# Patient Record
Sex: Male | Born: 1935 | Race: Black or African American | Hispanic: No | Smoking: Never smoker
Health system: Southern US, Community
[De-identification: ages and names within clinical notes are randomized; demographics above are authoritative.]

## PROBLEM LIST (undated history)

## (undated) ENCOUNTER — Emergency Department (HOSPITAL_COMMUNITY): Admission: EM | Payer: Medicare Other | Source: Home / Self Care

## (undated) DIAGNOSIS — N289 Disorder of kidney and ureter, unspecified: Secondary | ICD-10-CM

## (undated) DIAGNOSIS — N400 Enlarged prostate without lower urinary tract symptoms: Secondary | ICD-10-CM

## (undated) DIAGNOSIS — I10 Essential (primary) hypertension: Secondary | ICD-10-CM

## (undated) DIAGNOSIS — F329 Major depressive disorder, single episode, unspecified: Secondary | ICD-10-CM

## (undated) DIAGNOSIS — E039 Hypothyroidism, unspecified: Secondary | ICD-10-CM

## (undated) DIAGNOSIS — F32A Depression, unspecified: Secondary | ICD-10-CM

## (undated) HISTORY — PX: HERNIA REPAIR: SHX51

---

## 2001-08-11 ENCOUNTER — Emergency Department (HOSPITAL_COMMUNITY): Admission: EM | Admit: 2001-08-11 | Discharge: 2001-08-11 | Payer: Self-pay | Admitting: Emergency Medicine

## 2001-08-11 ENCOUNTER — Encounter: Payer: Self-pay | Admitting: Emergency Medicine

## 2001-08-19 ENCOUNTER — Emergency Department (HOSPITAL_COMMUNITY): Admission: EM | Admit: 2001-08-19 | Discharge: 2001-08-19 | Payer: Self-pay | Admitting: *Deleted

## 2001-08-26 ENCOUNTER — Ambulatory Visit (HOSPITAL_COMMUNITY): Admission: RE | Admit: 2001-08-26 | Discharge: 2001-08-26 | Payer: Self-pay | Admitting: Family Medicine

## 2001-08-26 ENCOUNTER — Encounter: Payer: Self-pay | Admitting: Family Medicine

## 2001-08-28 ENCOUNTER — Emergency Department (HOSPITAL_COMMUNITY): Admission: EM | Admit: 2001-08-28 | Discharge: 2001-08-28 | Payer: Self-pay | Admitting: *Deleted

## 2001-08-28 ENCOUNTER — Encounter: Payer: Self-pay | Admitting: *Deleted

## 2001-09-20 ENCOUNTER — Encounter: Payer: Self-pay | Admitting: Family Medicine

## 2001-09-20 ENCOUNTER — Ambulatory Visit (HOSPITAL_COMMUNITY): Admission: RE | Admit: 2001-09-20 | Discharge: 2001-09-20 | Payer: Self-pay | Admitting: Family Medicine

## 2003-01-04 ENCOUNTER — Encounter: Admission: RE | Admit: 2003-01-04 | Discharge: 2003-04-04 | Payer: Self-pay | Admitting: Family Medicine

## 2005-11-11 ENCOUNTER — Ambulatory Visit: Payer: Self-pay | Admitting: Internal Medicine

## 2005-11-11 ENCOUNTER — Ambulatory Visit (HOSPITAL_COMMUNITY): Admission: RE | Admit: 2005-11-11 | Discharge: 2005-11-11 | Payer: Self-pay | Admitting: Internal Medicine

## 2007-10-17 ENCOUNTER — Emergency Department (HOSPITAL_COMMUNITY): Admission: EM | Admit: 2007-10-17 | Discharge: 2007-10-17 | Payer: Self-pay | Admitting: Emergency Medicine

## 2008-07-20 ENCOUNTER — Ambulatory Visit (HOSPITAL_COMMUNITY): Admission: RE | Admit: 2008-07-20 | Discharge: 2008-07-20 | Payer: Self-pay | Admitting: Family Medicine

## 2009-04-03 ENCOUNTER — Emergency Department (HOSPITAL_COMMUNITY): Admission: EM | Admit: 2009-04-03 | Discharge: 2009-04-03 | Payer: Self-pay | Admitting: Emergency Medicine

## 2009-04-15 ENCOUNTER — Ambulatory Visit (HOSPITAL_COMMUNITY): Admission: RE | Admit: 2009-04-15 | Discharge: 2009-04-15 | Payer: Self-pay | Admitting: Urology

## 2009-05-13 ENCOUNTER — Emergency Department (HOSPITAL_COMMUNITY): Admission: EM | Admit: 2009-05-13 | Discharge: 2009-05-14 | Payer: Self-pay | Admitting: Emergency Medicine

## 2009-06-28 ENCOUNTER — Encounter (INDEPENDENT_AMBULATORY_CARE_PROVIDER_SITE_OTHER): Payer: Self-pay | Admitting: Urology

## 2009-06-28 ENCOUNTER — Inpatient Hospital Stay (HOSPITAL_COMMUNITY): Admission: RE | Admit: 2009-06-28 | Discharge: 2009-07-02 | Payer: Self-pay | Admitting: Urology

## 2009-07-23 ENCOUNTER — Ambulatory Visit (HOSPITAL_COMMUNITY): Admission: RE | Admit: 2009-07-23 | Discharge: 2009-07-23 | Payer: Self-pay | Admitting: Urology

## 2009-09-24 ENCOUNTER — Ambulatory Visit (HOSPITAL_COMMUNITY): Admission: RE | Admit: 2009-09-24 | Discharge: 2009-09-24 | Payer: Self-pay | Admitting: Urology

## 2010-02-07 ENCOUNTER — Ambulatory Visit (HOSPITAL_COMMUNITY): Admission: RE | Admit: 2010-02-07 | Discharge: 2010-02-07 | Payer: Self-pay | Admitting: Urology

## 2011-02-17 ENCOUNTER — Emergency Department (HOSPITAL_COMMUNITY)
Admission: EM | Admit: 2011-02-17 | Discharge: 2011-02-17 | Disposition: A | Discharge status: Home / Self Care | Payer: Medicare PPO | Attending: Emergency Medicine | Admitting: Emergency Medicine

## 2011-02-17 DIAGNOSIS — E039 Hypothyroidism, unspecified: Secondary | ICD-10-CM | POA: Insufficient documentation

## 2011-02-17 DIAGNOSIS — E119 Type 2 diabetes mellitus without complications: Secondary | ICD-10-CM | POA: Insufficient documentation

## 2011-02-17 DIAGNOSIS — M25579 Pain in unspecified ankle and joints of unspecified foot: Secondary | ICD-10-CM | POA: Insufficient documentation

## 2011-02-17 DIAGNOSIS — I10 Essential (primary) hypertension: Secondary | ICD-10-CM | POA: Insufficient documentation

## 2011-02-17 DIAGNOSIS — M109 Gout, unspecified: Secondary | ICD-10-CM | POA: Insufficient documentation

## 2011-03-12 LAB — BASIC METABOLIC PANEL
BUN: 17 mg/dL (ref 6–23)
CO2: 27 mEq/L (ref 19–32)
Chloride: 103 mEq/L (ref 96–112)
Creatinine, Ser: 1.58 mg/dL — ABNORMAL HIGH (ref 0.4–1.5)
GFR calc non Af Amer: 43 mL/min — ABNORMAL LOW (ref >60)
Glucose, Bld: 159 mg/dL — ABNORMAL HIGH (ref 70–99)

## 2011-03-15 LAB — BASIC METABOLIC PANEL
BUN: 11 mg/dL (ref 6–23)
BUN: 12 mg/dL (ref 6–23)
BUN: 13 mg/dL (ref 6–23)
CO2: 25 mEq/L (ref 19–32)
CO2: 29 mEq/L (ref 19–32)
CO2: 29 mEq/L (ref 19–32)
Calcium: 8.3 mg/dL — ABNORMAL LOW (ref 8.4–10.5)
Calcium: 9.1 mg/dL (ref 8.4–10.5)
Chloride: 100 mEq/L (ref 96–112)
Chloride: 99 mEq/L (ref 96–112)
Creatinine, Ser: 1.14 mg/dL (ref 0.4–1.5)
Creatinine, Ser: 1.15 mg/dL (ref 0.4–1.5)
GFR calc Af Amer: >60 mL/min (ref >60)
GFR calc Af Amer: >60 mL/min (ref >60)
GFR calc Af Amer: >60 mL/min (ref >60)
GFR calc non Af Amer: 47 mL/min — ABNORMAL LOW (ref >60)
GFR calc non Af Amer: >60 mL/min (ref >60)
GFR calc non Af Amer: >60 mL/min (ref >60)
Glucose, Bld: 122 mg/dL — ABNORMAL HIGH (ref 70–99)
Glucose, Bld: 188 mg/dL — ABNORMAL HIGH (ref 70–99)
Potassium: 3.1 mEq/L — ABNORMAL LOW (ref 3.5–5.1)
Potassium: 3.4 mEq/L — ABNORMAL LOW (ref 3.5–5.1)
Potassium: 3.5 mEq/L (ref 3.5–5.1)
Sodium: 137 mEq/L (ref 135–145)
Sodium: 138 mEq/L (ref 135–145)
Sodium: 139 mEq/L (ref 135–145)

## 2011-03-15 LAB — GLUCOSE, CAPILLARY
Glucose-Capillary: 102 mg/dL — ABNORMAL HIGH (ref 70–99)
Glucose-Capillary: 105 mg/dL — ABNORMAL HIGH (ref 70–99)
Glucose-Capillary: 107 mg/dL — ABNORMAL HIGH (ref 70–99)
Glucose-Capillary: 114 mg/dL — ABNORMAL HIGH (ref 70–99)
Glucose-Capillary: 122 mg/dL — ABNORMAL HIGH (ref 70–99)
Glucose-Capillary: 161 mg/dL — ABNORMAL HIGH (ref 70–99)

## 2011-03-15 LAB — DIFFERENTIAL
Basophils Absolute: 0 10*3/uL (ref 0.0–0.1)
Basophils Absolute: 0 10*3/uL (ref 0.0–0.1)
Basophils Relative: 0 % (ref 0–1)
Basophils Relative: 0 % (ref 0–1)
Eosinophils Absolute: 0 10*3/uL (ref 0.0–0.7)
Eosinophils Absolute: 0.2 10*3/uL (ref 0.0–0.7)
Eosinophils Relative: 3 % (ref 0–5)
Lymphocytes Relative: 8 % — ABNORMAL LOW (ref 12–46)
Lymphs Abs: 0.7 10*3/uL (ref 0.7–4.0)
Lymphs Abs: 1 10*3/uL (ref 0.7–4.0)
Monocytes Absolute: 0.5 10*3/uL (ref 0.1–1.0)
Monocytes Relative: 8 % (ref 3–12)
Monocytes Relative: 8 % (ref 3–12)
Monocytes Relative: 8 % (ref 3–12)
Neutro Abs: 4.6 10*3/uL (ref 1.7–7.7)
Neutro Abs: 7.7 10*3/uL (ref 1.7–7.7)

## 2011-03-15 LAB — CBC
HCT: 33.1 % — ABNORMAL LOW (ref 39.0–52.0)
Hemoglobin: 11.4 g/dL — ABNORMAL LOW (ref 13.0–17.0)
MCHC: 34.5 g/dL (ref 30.0–36.0)
MCV: 90.4 fL (ref 78.0–100.0)
MCV: 90.6 fL (ref 78.0–100.0)
Platelets: 182 10*3/uL (ref 150–400)
Platelets: 186 10*3/uL (ref 150–400)
Platelets: 196 10*3/uL (ref 150–400)
RBC: 3.77 MIL/uL — ABNORMAL LOW (ref 4.22–5.81)
RDW: 15.4 % (ref 11.5–15.5)
WBC: 5.9 10*3/uL (ref 4.0–10.5)
WBC: 9.2 10*3/uL (ref 4.0–10.5)

## 2011-03-16 LAB — URINALYSIS, ROUTINE W REFLEX MICROSCOPIC
Nitrite: NEGATIVE
Specific Gravity, Urine: 1.02 (ref 1.005–1.030)
Urobilinogen, UA: 1 mg/dL (ref 0.0–1.0)
pH: 6 (ref 5.0–8.0)

## 2011-03-16 LAB — CBC
HCT: 31.8 % — ABNORMAL LOW (ref 39.0–52.0)
MCHC: 35 g/dL (ref 30.0–36.0)
MCV: 89.6 fL (ref 78.0–100.0)
Platelets: 196 10*3/uL (ref 150–400)
RDW: 14.6 % (ref 11.5–15.5)

## 2011-03-16 LAB — URINE MICROSCOPIC-ADD ON

## 2011-03-16 LAB — URINE CULTURE

## 2011-03-16 LAB — COMPREHENSIVE METABOLIC PANEL
Albumin: 2.9 g/dL — ABNORMAL LOW (ref 3.5–5.2)
BUN: 23 mg/dL (ref 6–23)
Calcium: 8.7 mg/dL (ref 8.4–10.5)
Creatinine, Ser: 1.68 mg/dL — ABNORMAL HIGH (ref 0.4–1.5)
Glucose, Bld: 109 mg/dL — ABNORMAL HIGH (ref 70–99)
Potassium: 3.8 mEq/L (ref 3.5–5.1)
Total Protein: 6.9 g/dL (ref 6.0–8.3)

## 2011-03-16 LAB — DIFFERENTIAL
Lymphocytes Relative: 2 % — ABNORMAL LOW (ref 12–46)
Monocytes Absolute: 0.6 10*3/uL (ref 0.1–1.0)
Monocytes Relative: 5 % (ref 3–12)
Neutro Abs: 10.7 10*3/uL — ABNORMAL HIGH (ref 1.7–7.7)
Neutrophils Relative %: 93 % — ABNORMAL HIGH (ref 43–77)

## 2011-03-18 LAB — GLUCOSE, CAPILLARY: Glucose-Capillary: 137 mg/dL — ABNORMAL HIGH (ref 70–99)

## 2011-03-18 LAB — COMPREHENSIVE METABOLIC PANEL
ALT: 26 U/L (ref 0–53)
AST: 26 U/L (ref 0–37)
Albumin: 3.5 g/dL (ref 3.5–5.2)
Alkaline Phosphatase: 45 U/L (ref 39–117)
Calcium: 9.1 mg/dL (ref 8.4–10.5)
GFR calc Af Amer: 46 mL/min — ABNORMAL LOW (ref >60)
Potassium: 3 mEq/L — ABNORMAL LOW (ref 3.5–5.1)
Sodium: 138 mEq/L (ref 135–145)
Total Protein: 7.1 g/dL (ref 6.0–8.3)

## 2011-03-18 LAB — URINALYSIS, ROUTINE W REFLEX MICROSCOPIC
Bilirubin Urine: NEGATIVE
Nitrite: NEGATIVE
Specific Gravity, Urine: 1.01 (ref 1.005–1.030)
Urobilinogen, UA: 0.2 mg/dL (ref 0.0–1.0)
pH: 6 (ref 5.0–8.0)

## 2011-03-18 LAB — DIFFERENTIAL
Basophils Absolute: 0 10*3/uL (ref 0.0–0.1)
Basophils Relative: 0 % (ref 0–1)
Neutro Abs: 3.8 10*3/uL (ref 1.7–7.7)
Neutrophils Relative %: 70 % (ref 43–77)

## 2011-03-18 LAB — CBC
MCHC: 34 g/dL (ref 30.0–36.0)
Platelets: 186 10*3/uL (ref 150–400)
RDW: 14.9 % (ref 11.5–15.5)

## 2011-03-18 LAB — URINE MICROSCOPIC-ADD ON

## 2011-03-18 LAB — TSH: TSH: 2.21 u[IU]/mL (ref 0.350–4.500)

## 2011-04-21 NOTE — H&P (Signed)
NAME:  Garrett Bradley, Garrett Bradley               ACCOUNT NO.:  [id number]   MEDICAL RECORD NO.:  [id number]          PATIENT TYPE:  AMB   LOCATION:  DAY                           FACILITY:  [facility]   PHYSICIAN:  Ky Barban, M.D.DATE OF BIRTH:  [birth year]   DATE OF ADMISSION:  DATE OF DISCHARGE:  LH                              HISTORY & PHYSICAL   CHIEF COMPLAINT:  Urinary retention.   This 77 year old patient is primarily patient of Dr. Rito Ehrlich and has  been evaluated by him for urinary retention.  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  He  does have question of a lesion on his left bladder wall.  After having a  long discussion with the patient and his wife, I told them that he needs  to have a TUR prostate and also biopsy of the bladder.  At the same  time, I need to insert a double-J stent on the left side as a  prophylactic so that he does not develop obstruction and develop  pyelonephritis on that side.  Once the TUR prostate is out of the way,  at a later date I can go after the stone in the distal left ureter with  a ureteroscope and once the ureter is cleared, then we can talk about  going after the stone in the left kidney.  He understands and wants me  to go ahead and proceed.  He is coming as outpatient and will go ahead  and proceed what I have just mentioned.   PAST MEDICAL HISTORY:  The patient has several other medical problems.  It includes hypertension, diabetes, gout, hypothyroidism, kidney stones.   SURGICAL HISTORY:  Hernia repair and removal of kidney stones in the  past.   PERSONAL HISTORY:  Does not smoke or drink.   FAMILY HISTORY:  No history of prostate cancer.   REVIEW OF SYSTEMS:  Unremarkable.   PHYSICAL EXAMINATION:  VITAL SIGNS:  Blood pressure 140/90.   Temperature  is normal.  CENTRAL NERVOUS SYSTEM:  No gross neurological deficit.  HEAD, EYE, ENT:  Negative.  CHEST:  Symmetrical.  HEART:  Regular sinus rhythm.  No murmur.  ABDOMINAL:  Soft, flat.  Liver, spleen, kidneys are not palpable.  No  CVA tenderness.  EXTERNAL GENITALIA:  Foley catheter in place.  Uncircumcised.  Meatus  adequate.  Testicles are normal.  RECTAL:  Normal sphincter tone.  Prostate 1-1/2 plus, smooth, and firm.   IMPRESSION:  1. Hypertension.  2. Diabetes.  3. Gout.  4. Left ureteral calculus.  5. Left renal calculus.  6. BPH with bladder neck obstruction.  7. Lesion in the bladder.   PLAN:  Cystoscopy, bladder biopsy, insertion of double-J stent on the  left side, TUR prostate, and then admit him in the hospital.      Ky Barban, M.D.  Electronically Signed  MIJ/MEDQ  D:  06/27/2009  T:  06/27/2009  Job:  [id number]

## 2011-04-21 NOTE — Op Note (Signed)
NAME:  Kollman, Grady               ACCOUNT NO.:  [id number]   MEDICAL RECORD NO.:  [id number]          PATIENT TYPE:  INP   LOCATION:  A313                          FACILITY:  [facility]   PHYSICIAN:  Ky Barban, M.D.DATE OF BIRTH:  Jun 08, 1934   DATE OF PROCEDURE:  06/28/2009  DATE OF DISCHARGE:                               OPERATIVE REPORT   PREOPERATIVE DIAGNOSES:  Acute urinary retention, benign prostatic  hypertrophy, left ureteral calculus, and bladder lesion.   POSTOPERATIVE DIAGNOSES:  Acute urinary retention, benign prostatic  hypertrophy, left ureteral calculus, and bladder lesion.   PROCEDURE:  Cystoscopy, left retrograde pyelogram, attempt to insert  stent on left side failed, bladder biopsy of left bladder wall, and  transurethral resection prostate.   ANESTHESIA:  Spinal.   PROCEDURE:  The patient under spinal anesthesia in lithotomy position.  After usual prep and drape, #25 cystoscope introduced into the bladder.  It is inspected the lesion which I seen at the time of cystoscopy  located in the left bladder wall has disappeared.  It looks like it  could be reaction from the Foley catheter, but I went ahead and took a  biopsy of 1 area which looked irrigated and on the left bladder wall, it  was taken out with the help of a flexible biopsy forceps.  Then, I  proceeded to left retrograde pyelogram, the left ureteral orifice was  catheterized with a wedge catheter.  Hypaque injected under fluoroscopic  control.  The dye goes up into the upper ureter and collecting system is  outlined.  The ureter was dilated to the ureterovesical junction, and  then I tried to put a guidewire.  I could not anything into the ureter.  I tried ureteroscope, but I was unable to go into the ureter.  So, I  decided to leave it is proceeded to TUR prostate and now, I introduced  28 Iglesias resectoscope.  He has a rather large median lobe which was  resected to the level of the  verumontanum.  Then, the bladder neck was  circumferentially dissected down to the circular fibers.  Resectoscope  was pulled back at the level of verumontanum, rotated to a 11 o'clock  position.  Resection of the left and right lobe was done between a 11  and 7 o'clock position.  Similarly, a left lobe was resected between 1  and 5 o'clock position.  The posterior midline tissue between 5 and 7  o'clock position was carefully dissected along with the apical tissue to  not injure the sphincter of the verumontanum.  There is small amount of  a tissue in the anterior midline which was resected carefully.  Most of  the adenoma is resected.  Prostatic urethra looks wide open.  Chips were  evacuated.  Bleeders were coagulated.  At this point, resectoscope was  removed, 22 three-way Foley catheter left in for drainage.  The patient  left the operating room in satisfactory condition.     Ky Barban, M.D.  Electronically Signed    MIJ/MEDQ  D:  06/28/2009  T:  06/29/2009  Job:  [id number] 

## 2011-04-21 NOTE — Consult Note (Signed)
NAME:  Garrett Bradley, Garrett Bradley               ACCOUNT NO.:  [id number]   MEDICAL RECORD NO.:  [id number]          PATIENT TYPE:  INP   LOCATION:  A313                          FACILITY:  [facility]   PHYSICIAN:  Osvaldo Shipper, MD     DATE OF BIRTH:  July 17, 1934   DATE OF CONSULTATION:  06/29/2009  DATE OF DISCHARGE:                                 CONSULTATION   REQUESTING CONSULTATION:  Ky Barban, MD   REASON FOR CONSULTATION:  Management of hypertension.   CHIEF COMPLAINT:  Problems with urination.   HISTORY OF PRESENT ILLNESS:  The patient is a 77 year old African  American male who has a history of hypertension, BPH, depression, kidney  stones, who underwent an elective transurethral resection of his  prostate yesterday done by Dr. Jerre Simon.  This morning, the patient is  feeling quite well.  He denies any complaints whatsoever.  He had some  pain last night, which was relieved with Percocet.  He wants to know if  he can get out of the bed and try to walk.  I have told him to this  question to Dr. Jerre Simon as he is still getting irrigation to his bladder.  The patient otherwise denies any chest pain or shortness of breath.  He  feels quite well.   MEDICATIONS AT HOME:  1. Ramipril 10 mg b.i.d.  2. Glimepiride 4 mg b.i.d.  3. Sertraline 50 mg daily.  4. Ativan 1 mg 3 times daily as needed.  5. Metformin 500 mg b.i.d.  6. Potassium chloride 10 mEq daily.  7. Bupropion 300 mg daily.  8. Tamsulosin 0.4 mg at bedtime.  9. Hydrochlorothiazide 25 mg daily.  10.Terazosin unknown dose daily.  11.Cardizem CD 300 mg daily.  12.Synthroid 50 mcg daily.   In the hospital, he is also on Cipro 500 mg b.i.d.  He is on IV fluids  at 100 mL per minute.  He is on morphine as needed.  Percocet as needed.   ALLERGIES:  No known drug allergies.   PAST MEDICAL HISTORY:  Positive for type 2 diabetes, hypertension, BPH,  depression, history of kidney stones, history of hernia repair, kidney  stone  removal in the past.  He had had a colonoscopy in 2006, which is  normal.   SOCIAL HISTORY:  Lives in [place] with his wife.  No smoking,  alcohol, or illicit drug use.  She is a retired [employer] architect, ambulates  without any assistive devices.   FAMILY HISTORY:  Positive for high blood pressure.   REVIEW OF SYSTEMS:  Negative except as in the HPI.   PHYSICAL EXAMINATION:  VITAL SIGNS:  Temperature 97.8, blood pressure  145/93, respiratory rate 20, heart rate 63, and saturation 97% on room  air.  GENERAL:  Overweight, obese, elderly male in no distress.  HEENT:  There is no pallor.  No icterus.  Oral mucous membranes moist.  No oral lesions are noted.  NECK:  Soft and supple.  No thyromegaly is appreciated.  LUNGS:  Clear to auscultation bilaterally.  No wheezing, rales, or  rhonchi.  CARDIOVASCULAR:  S1  and S2 is normal and regular.  No murmurs  appreciated.  No S3 or S4.  No rubs.  No bruits.  ABDOMEN:  Obese, nontender, nondistended.  Bowel sounds are present.  No  masses or organomegaly is appreciated.  MUSCULOSKELETAL:  Unremarkable.  GU:  Deferred.  No pedal edema.  NEUROLOGIC:  He is alert and oriented x3.  No focal neurological  deficits are present.  SKIN:  Does not reveal any rashes.   LAB DATA:  This morning CBG was 175.  Labs done on the 19th show mild  anemia at 11.6 with a normal MCV.  Renal function was normal at that  time.   IMAGING STUDIES:  He had a retrograde pyelogram done yesterday, which  showed mild left hydronephrosis and hydroureter with a suspected a  questionable obstruction at the UV junction.   We have an EKG done on May 13, 2009, which shows sinus rhythm at a rate  of 88 and evidence for left axis deviation is noted.  He is in the other  intervals otherwise are all normal.  No Q-waves identified.  No  concerning ST or T-wave changes identified.  He does have poor R-wave  progression.   ASSESSMENT:  This is a 77 year old African American  male, who underwent  an elective TURP procedure yesterday.  We were consulted on this patient  this morning for management of hypertension.  The patient is  asymptomatic at this time his blood pressure is elevated this morning.  He seems to be quite stable medically after his procedure.  1. Hypertension.  We will resume all of his oral antihypertensive      agents except for the diuretics, which we will hold for now.  2. Type 2 diabetes.  Restart his p.o. hypoglycemic agents.  CBG will      be checked q.a.c. and h.s. and sliding scale will be provided.      HbA1c will be checked as well.  3. History of depression.  Continue with his antidepressants.  4. History of hypothyroidism.  Restart his Synthroid.  No need to      check his thyroid function test at this time.   DVT prophylaxis will be initiated.   Once again, the patient continues to be medically stable.  His blood  pressure should improve after initiation of his antihypertensive agents.   We would like to thank Dr. Jerre Simon for this consultation request we would  be happy to follow this patient along with him while he is hospitalized.      Osvaldo Shipper, MD  Electronically Signed     GK/MEDQ  D:  06/29/2009  T:  06/29/2009  Job:  [id number]   cc:   Ky Barban, M.D.  Fax: [phone]   Patrica Duel, M.D.  Fax: [phone]

## 2011-04-24 NOTE — Op Note (Signed)
NAME:  Garrett Bradley, Garrett Bradley               ACCOUNT NO.:  [id number]   MEDICAL RECORD NO.:  [id number]          PATIENT TYPE:  AMB   LOCATION:  DAY                           FACILITY:  [facility]   PHYSICIAN:  R. Roetta Sessions, M.D. DATE OF BIRTH:  Aug 12, 1934   DATE OF PROCEDURE:  11/11/2005  DATE OF DISCHARGE:                                 OPERATIVE REPORT   PROCEDURE:  Screening colonoscopy.   INDICATIONS FOR PROCEDURE:  The patient is a 77 year old gentleman sent over  at the courtesy of Dr. Nobie Putnam for colorectal cancer screening. He tells me  he had a colonoscopy some 20 years ago in [place] without significant  findings. He has no lower GI tract symptoms. There is no family history of  colorectal neoplasia. Colonoscopy is now being done as a screening maneuver.  This approach has been discussed with the patient at length. Potential  risks, benefits, and alternatives have been reviewed and questions answered.  He is agreeable. Please see documentation in the medical record.   PROCEDURE NOTE:  O2 saturation, blood pressure, pulse, and respirations were  monitored throughout the entire procedure. Conscious sedation with Versed 4  mg IV and Demerol 75 mg IV in divided doses.   INSTRUMENT:  Olympus video chip system.   FINDINGS:  Digital rectal exam revealed no abnormalities.   ENDOSCOPIC FINDINGS:  Prep was good.   Rectum:  Examination of the rectal mucosa including retroflexed view of the  anal verge revealed no abnormalities.   Colon:  Colonic mucosa was surveyed from the rectosigmoid junction through  the left, transverse, and right colon to the area of the appendiceal  orifice, ileocecal valve, and cecum. These structures were well seen and  photographed for the record. From this level, the scope was slowly  withdrawn, and all previously mentioned mucosal surfaces were again seen.  The colonic mucosa appeared normal. The patient had a long redundant colon  requiring a number of  maneuvers including changing of the patient's position  and external abdominal pressure to reach the cecum. The patient tolerated  the procedure well and was reactive to endoscopy.   IMPRESSION:  1.  Normal rectum.  2.  Normal colonic mucosa.   RECOMMENDATIONS:  Repeat screening colonoscopy in 10 years.      Jonathon Bellows, M.D.  Electronically Signed     RMR/MEDQ  D:  11/11/2005  T:  11/11/2005  Job:  [id number]   cc:   Patrica Duel, M.D.  Fax: [phone]

## 2011-04-27 ENCOUNTER — Emergency Department (HOSPITAL_COMMUNITY)
Admission: EM | Admit: 2011-04-27 | Discharge: 2011-04-27 | Disposition: A | Discharge status: Home / Self Care | Payer: Medicare PPO | Attending: Emergency Medicine | Admitting: Emergency Medicine

## 2011-04-27 DIAGNOSIS — G4700 Insomnia, unspecified: Secondary | ICD-10-CM | POA: Insufficient documentation

## 2011-04-27 DIAGNOSIS — E119 Type 2 diabetes mellitus without complications: Secondary | ICD-10-CM | POA: Insufficient documentation

## 2011-04-27 DIAGNOSIS — I10 Essential (primary) hypertension: Secondary | ICD-10-CM | POA: Insufficient documentation

## 2011-04-27 DIAGNOSIS — E876 Hypokalemia: Secondary | ICD-10-CM | POA: Insufficient documentation

## 2011-04-27 DIAGNOSIS — M109 Gout, unspecified: Secondary | ICD-10-CM | POA: Insufficient documentation

## 2011-04-27 DIAGNOSIS — E039 Hypothyroidism, unspecified: Secondary | ICD-10-CM | POA: Insufficient documentation

## 2011-04-27 LAB — BASIC METABOLIC PANEL
Calcium: 10.5 mg/dL (ref 8.4–10.5)
GFR calc Af Amer: 52 mL/min — ABNORMAL LOW (ref >60)
GFR calc non Af Amer: 43 mL/min — ABNORMAL LOW (ref >60)
Sodium: 140 mEq/L (ref 135–145)

## 2011-05-06 ENCOUNTER — Emergency Department (HOSPITAL_COMMUNITY)
Admission: EM | Admit: 2011-05-06 | Discharge: 2011-05-06 | Disposition: A | Discharge status: Home / Self Care | Payer: Medicare PPO | Attending: Emergency Medicine | Admitting: Emergency Medicine

## 2011-05-06 DIAGNOSIS — M549 Dorsalgia, unspecified: Secondary | ICD-10-CM | POA: Insufficient documentation

## 2011-05-06 DIAGNOSIS — N390 Urinary tract infection, site not specified: Secondary | ICD-10-CM | POA: Insufficient documentation

## 2011-05-06 DIAGNOSIS — E860 Dehydration: Secondary | ICD-10-CM | POA: Insufficient documentation

## 2011-05-06 DIAGNOSIS — E119 Type 2 diabetes mellitus without complications: Secondary | ICD-10-CM | POA: Insufficient documentation

## 2011-05-06 DIAGNOSIS — N289 Disorder of kidney and ureter, unspecified: Secondary | ICD-10-CM | POA: Insufficient documentation

## 2011-05-06 DIAGNOSIS — R5381 Other malaise: Secondary | ICD-10-CM | POA: Insufficient documentation

## 2011-05-06 LAB — DIFFERENTIAL
Eosinophils Relative: 0 % (ref 0–5)
Lymphocytes Relative: 18 % (ref 12–46)
Lymphs Abs: 1.6 10*3/uL (ref 0.7–4.0)
Neutro Abs: 6.2 10*3/uL (ref 1.7–7.7)

## 2011-05-06 LAB — URINALYSIS, MICROSCOPIC ONLY
Bilirubin Urine: NEGATIVE
Ketones, ur: NEGATIVE mg/dL
Nitrite: POSITIVE — AB

## 2011-05-06 LAB — CBC
HCT: 33.8 % — ABNORMAL LOW (ref 39.0–52.0)
Hemoglobin: 11.2 g/dL — ABNORMAL LOW (ref 13.0–17.0)
MCV: 84.5 fL (ref 78.0–100.0)
RDW: 14.8 % (ref 11.5–15.5)
WBC: 8.9 10*3/uL (ref 4.0–10.5)

## 2011-05-06 LAB — BASIC METABOLIC PANEL
BUN: 21 mg/dL (ref 6–23)
CO2: 33 mEq/L — ABNORMAL HIGH (ref 19–32)
Chloride: 92 mEq/L — ABNORMAL LOW (ref 96–112)
GFR calc non Af Amer: 38 mL/min — ABNORMAL LOW (ref >60)
Glucose, Bld: 153 mg/dL — ABNORMAL HIGH (ref 70–99)
Potassium: 3.4 mEq/L — ABNORMAL LOW (ref 3.5–5.1)
Sodium: 134 mEq/L — ABNORMAL LOW (ref 135–145)

## 2011-05-06 LAB — GLUCOSE, CAPILLARY

## 2011-05-08 LAB — URINE CULTURE
Colony Count: NO GROWTH
Culture  Setup Time: 201205311820
Culture: NO GROWTH

## 2011-08-17 ENCOUNTER — Encounter: Payer: Self-pay | Admitting: Emergency Medicine

## 2011-08-17 ENCOUNTER — Emergency Department (HOSPITAL_COMMUNITY)
Admission: EM | Admit: 2011-08-17 | Discharge: 2011-08-17 | Disposition: A | Discharge status: Home / Self Care | Payer: Medicare PPO | Attending: Emergency Medicine | Admitting: Emergency Medicine

## 2011-08-17 DIAGNOSIS — I10 Essential (primary) hypertension: Secondary | ICD-10-CM | POA: Insufficient documentation

## 2011-08-17 DIAGNOSIS — E119 Type 2 diabetes mellitus without complications: Secondary | ICD-10-CM | POA: Insufficient documentation

## 2011-08-17 DIAGNOSIS — L089 Local infection of the skin and subcutaneous tissue, unspecified: Secondary | ICD-10-CM | POA: Insufficient documentation

## 2011-08-17 DIAGNOSIS — K5900 Constipation, unspecified: Secondary | ICD-10-CM | POA: Insufficient documentation

## 2011-08-17 HISTORY — DX: Essential (primary) hypertension: I10

## 2011-08-17 MED ORDER — DOXYCYCLINE HYCLATE 100 MG PO CAPS: 100.0000 mg | ORAL_CAPSULE | Freq: Two times a day (BID) | ORAL | Status: AC

## 2011-08-17 NOTE — ED Provider Notes (Signed)
History   Chart scribed for Garrett Cooper III, MD by Enos Fling; the patient was seen in room APA07/APA07; this patient's care was started at 12:00 PM.    CSN: [id number] Arrival date & time: 08/17/2011 10:32 AM  Chief Complaint  Patient presents with  . Garrett Bradley   Garrett Bradley is a 77 y.o. male with h/o DM and HTN who presents to the Emergency Department complaining of Garrett Bradley. Pt reports sore to distal right anterior lower leg onset "a while ago" but has become worse in the last 3-4 days with increased swelling and pain. Pt reports small amount of clear drainage once that resolved with vaseline ointment. No fevers. No other rashes. No injury or known exposure to cause Garrett Bradley. Pt's blood sugar has been running 92-125 which is normal for pt.  PCP Dr. Phillips Odor Dr. Randa Evens for DM  Past Medical History  Diagnosis Date  . Hypertension   . Diabetes mellitus     Past Surgical History  Procedure Date  . Hernia repair   . Transurethral resection of prostate     History reviewed. No pertinent family history.  History  Substance Use Topics  . Smoking status: Never Smoker   . Smokeless tobacco: Not on file  . Alcohol Use: No   Previous Medications   DILTIAZEM (CARDIZEM CD) 300 MG 24 HR CAPSULE    Take 300 mg by mouth daily.     FISH OIL-OMEGA-3 FATTY ACIDS 1000 MG CAPSULE    Take 2 g by mouth daily.     GLIMEPIRIDE (AMARYL) 4 MG TABLET    Take 4 mg by mouth 2 (two) times daily.     HYDROCHLOROTHIAZIDE 25 MG TABLET    Take 25 mg by mouth daily.     LEVOTHYROXINE (SYNTHROID, LEVOTHROID) 50 MCG TABLET    Take 25 mcg by mouth daily.     LINAGLIPTIN (TRADJENTA) 5 MG TABS TABLET    Take 5 mg by mouth daily.     NIACIN (NIASPAN) 500 MG CR TABLET    Take 500 mg by mouth at bedtime.     RAMIPRIL (ALTACE) 10 MG CAPSULE    Take 10 mg by mouth 2 (two) times daily.     TERAZOSIN (HYTRIN) 10 MG CAPSULE    Take 10 mg by mouth at bedtime.       Allergies as of 08/17/2011  . (No Known Allergies)        Review of Systems  Constitutional: Negative for fever and unexpected weight change.  HENT: Negative for ear pain and sore throat.   Eyes: Negative for visual disturbance.  Respiratory: Negative for shortness of breath.   Cardiovascular: Negative for chest pain.  Gastrointestinal: Positive for constipation (mild). Negative for vomiting and diarrhea.  Genitourinary: Negative.   Musculoskeletal: Negative for myalgias.  Skin: Negative for Garrett Bradley.  Neurological: Negative for dizziness, seizures and syncope.  Hematological: Negative.   Psychiatric/Behavioral: Negative.     Physical Exam  BP 143/78  Pulse 66  Temp 98 F (36.7 C)  Resp 20  Ht 6\' 4"  (1.93 m)  Wt 297 lb (134.718 kg)  BMI 36.15 kg/m2  SpO2 98%  Physical Exam  Nursing note and vitals reviewed. Constitutional: He is oriented to person, place, and time. He appears well-developed and well-nourished. No distress.  HENT:  Head: Normocephalic.  Mouth/Throat: Oropharynx is clear and moist and mucous membranes are normal.  Eyes: Conjunctivae are normal. Pupils are equal, round, and reactive to light.  Neck: Normal  range of motion. Neck supple.  Cardiovascular: Normal rate, regular rhythm and intact distal pulses.  Exam reveals no gallop and no friction rub.   No murmur heard. Pulmonary/Chest: Effort normal and breath sounds normal. He has no wheezes. He has no rales.  Abdominal: Soft. There is no tenderness.  Musculoskeletal: Normal range of motion.  Neurological: He is alert and oriented to person, place, and time.  Skin: Skin is warm and dry.       Distal right anterior lower leg papular Garrett Bradley 2cmx2cm with excoriation and weeping; no surrounding cellulitis; hyperpigmentation BLE d/t venous stasis  Psychiatric: He has a normal mood and affect.    Procedures - none  OTHER DATA REVIEWED: Nursing notes and vital signs reviewed. Prior records reviewed.  LABS / RADIOLOGY: Wound culture pending  ED COURSE: 12:25  PM - culture of Garrett Bradley collected by Dr. Ignacia Palma and given to nurse   IMPRESSION: 1. Unspecified local infection of skin and subcutaneous tissue      PLAN: discharge All results reviewed and discussed with pt, questions answered, pt agreeable with plan.   CONDITION ON DISCHARGE: stable   MEDS GIVEN IN ED: none  DISCHARGE MEDICATIONS: New Prescriptions   DOXYCYCLINE (VIBRAMYCIN) 100 MG CAPSULE    Take 1 capsule (100 mg total) by mouth 2 (two) times daily.     SCRIBE ATTESTATION: I personally performed the services described in this documentation, which was scribed in my presence. The recorded information has been reviewed and considered. Osvaldo Human, M.D.        Garrett Cooper III, MD 08/17/11 [phone]

## 2011-08-17 NOTE — ED Notes (Signed)
Pt a/ox4. Resp even and unlabored. NAD at this time. D/C instructions reviewed with pt. Pt verbalized understanding. Pt ambulated to lobby with steady gate.  

## 2011-08-17 NOTE — ED Notes (Signed)
Pt c/o sore to right anterior lower leg x 4 days.

## 2011-08-20 ENCOUNTER — Emergency Department (HOSPITAL_COMMUNITY)
Admission: EM | Admit: 2011-08-20 | Discharge: 2011-08-20 | Disposition: A | Discharge status: Home / Self Care | Payer: Medicare PPO | Attending: Emergency Medicine | Admitting: Emergency Medicine

## 2011-08-20 ENCOUNTER — Encounter (HOSPITAL_COMMUNITY): Payer: Self-pay | Admitting: Emergency Medicine

## 2011-08-20 DIAGNOSIS — I10 Essential (primary) hypertension: Secondary | ICD-10-CM | POA: Insufficient documentation

## 2011-08-20 DIAGNOSIS — E869 Volume depletion, unspecified: Secondary | ICD-10-CM | POA: Insufficient documentation

## 2011-08-20 DIAGNOSIS — E119 Type 2 diabetes mellitus without complications: Secondary | ICD-10-CM | POA: Insufficient documentation

## 2011-08-20 DIAGNOSIS — E876 Hypokalemia: Secondary | ICD-10-CM

## 2011-08-20 LAB — BASIC METABOLIC PANEL
BUN: 25 mg/dL — ABNORMAL HIGH (ref 6–23)
Chloride: 100 mEq/L (ref 96–112)
Creatinine, Ser: 1.6 mg/dL — ABNORMAL HIGH (ref 0.50–1.35)
GFR calc Af Amer: 51 mL/min — ABNORMAL LOW (ref >60)
GFR calc non Af Amer: 42 mL/min — ABNORMAL LOW (ref >60)
Glucose, Bld: 125 mg/dL — ABNORMAL HIGH (ref 70–99)
Potassium: 3 mEq/L — ABNORMAL LOW (ref 3.5–5.1)

## 2011-08-20 LAB — URINALYSIS, ROUTINE W REFLEX MICROSCOPIC
Bilirubin Urine: NEGATIVE
Glucose, UA: NEGATIVE mg/dL
Hgb urine dipstick: NEGATIVE
Specific Gravity, Urine: 1.01 (ref 1.005–1.030)
Urobilinogen, UA: 0.2 mg/dL (ref 0.0–1.0)
pH: 6 (ref 5.0–8.0)

## 2011-08-20 LAB — CBC
Hemoglobin: 12.1 g/dL — ABNORMAL LOW (ref 13.0–17.0)
MCH: 28.3 pg (ref 26.0–34.0)
MCV: 86.4 fL (ref 78.0–100.0)
Platelets: 190 10*3/uL (ref 150–400)
RBC: 4.27 MIL/uL (ref 4.22–5.81)
WBC: 5.7 10*3/uL (ref 4.0–10.5)

## 2011-08-20 LAB — WOUND CULTURE: Gram Stain: NONE SEEN

## 2011-08-20 LAB — DIFFERENTIAL
Eosinophils Absolute: 0.2 10*3/uL (ref 0.0–0.7)
Eosinophils Relative: 3 % (ref 0–5)
Lymphocytes Relative: 19 % (ref 12–46)
Lymphs Abs: 1.1 10*3/uL (ref 0.7–4.0)
Monocytes Relative: 6 % (ref 3–12)

## 2011-08-20 MED ORDER — SODIUM CHLORIDE 0.9 % IV BOLUS (SEPSIS)
1000.0000 mL | Freq: Once | INTRAVENOUS | Status: AC
  Administered 2011-08-20: 1000 mL via INTRAVENOUS

## 2011-08-20 MED ORDER — POTASSIUM CHLORIDE 20 MEQ PO PACK
40.0000 meq | PACK | Freq: Once | ORAL | Status: AC
  Administered 2011-08-20: 40 meq via ORAL
  Filled 2011-08-20: qty 2

## 2011-08-20 NOTE — ED Notes (Signed)
Pt post void, foley cath placed. Residual <25 cc. Foley d/c'd per MD order.

## 2011-08-20 NOTE — ED Notes (Signed)
Pt states he started taking doxycycline for skin infection he states his urine output has been very little. Pt denies any pain. Pt states he feels weak.

## 2011-08-20 NOTE — ED Provider Notes (Signed)
History   Scribed for Hilario Quarry, MD, the patient was seen in room APA06/APA06. This chart was scribed by Clarita Crane. This patient's care was started at 8:37AM.   CSN: [id number] Arrival date & time: 08/20/2011  8:26 AM  Chief Complaint  Patient presents with  . Urinary Retention   HPI Garrett Bradley is a 77 y.o. male who presents to the Emergency Department complaining of constant decreased urine output onset 3 days ago after beginning course of doxycycline for skin infection to right ankle and persistent since. Patient also reports he has decreased fluid intake since onset of decreased urine output.  Patient note she last urinated 1.5 hours ago with 6-8 ounces in volume. Denies dysuria, fever, hematuria. Patient with h/o Transurethral resection of prostate performed by Dr. Jerre Simon in July 2010 to relieve urinary retention and has had no additional episodes of urinary retention since procedure performed. Patient also with h/o hypertension and diabetes.   HPI ELEMENTS: Onset: 3 days ago Duration: persistent since onset  Timing: constant  Context:  as above  Associated symptoms: No associated symptoms. Denies dysuria, fever, hematuria    PAST MEDICAL HISTORY:  Past Medical History  Diagnosis Date  . Hypertension   . Diabetes mellitus     PAST SURGICAL HISTORY:  Past Surgical History  Procedure Date  . Hernia repair   . Transurethral resection of prostate     MEDICATIONS:  Previous Medications   ALPRAZOLAM (XANAX) 1 MG TABLET    Take 1 mg by mouth every 8 (eight) hours. For anxiety    BAYER ASPIRIN PO    Take 1 tablet by mouth daily.     DILTIAZEM (CARDIZEM CD) 300 MG 24 HR CAPSULE    Take 300 mg by mouth daily.     DOXYCYCLINE (VIBRAMYCIN) 100 MG CAPSULE    Take 1 capsule (100 mg total) by mouth 2 (two) times daily.   DOXYLAMINE, SLEEP, (UNISOM) 25 MG TABLET    Take 25 mg by mouth at bedtime as needed. For sleep    FISH OIL-OMEGA-3 FATTY ACIDS 1000 MG CAPSULE    Take 2  g by mouth daily.     GLIMEPIRIDE (AMARYL) 4 MG TABLET    Take 4 mg by mouth 2 (two) times daily.     HYDROCHLOROTHIAZIDE 25 MG TABLET    Take 25 mg by mouth daily.     LEVOTHYROXINE (SYNTHROID, LEVOTHROID) 50 MCG TABLET    Take 25 mcg by mouth daily.     LINAGLIPTIN (TRADJENTA) 5 MG TABS TABLET    Take 5 mg by mouth daily.     NIACIN (NIASPAN) 500 MG CR TABLET    Take 500 mg by mouth at bedtime.     POTASSIUM CHLORIDE (MICRO-K) 10 MEQ CR CAPSULE    Take 10 mEq by mouth daily.     RAMIPRIL (ALTACE) 10 MG CAPSULE    Take 10 mg by mouth 2 (two) times daily.     TERAZOSIN (HYTRIN) 10 MG CAPSULE    Take 10 mg by mouth at bedtime.     TRAVOPROST, BAK FREE, (TRAVATAMN) 0.004 % SOLN OPHTHALMIC SOLUTION    Place 1 drop into both eyes at bedtime.       ALLERGIES:  Allergies as of 08/20/2011  . (No Known Allergies)     FAMILY HISTORY:  History reviewed. No pertinent family history.   SOCIAL HISTORY: History   Social History  . Marital Status: Married    Spouse Name: N/A  Number of Children: N/A  . Years of Education: N/A   Social History Main Topics  . Smoking status: Never Smoker   . Smokeless tobacco: None  . Alcohol Use: No  . Drug Use: No  . Sexually Active:    Other Topics Concern  . None   Social History Narrative  . None     Review of Systems 10 Systems reviewed and are negative for acute change except as noted in the HPI.  Physical Exam  BP 134/69  Pulse 78  Temp(Src) 98.3 F (36.8 C) (Oral)  Resp 20  Ht 6\' 4"  (1.93 m)  Wt 297 lb (134.718 kg)  BMI 36.15 kg/m2  SpO2 99%  Physical Exam  Nursing note and vitals reviewed. Constitutional: He is oriented to person, place, and time. He appears well-developed and well-nourished. No distress.  HENT:  Head: Normocephalic and atraumatic.  Eyes: Pupils are equal, round, and reactive to light.  Neck: Neck supple.  Cardiovascular: Normal rate, regular rhythm and normal heart sounds.  Exam reveals no gallop and no  friction rub.   No murmur heard. Pulmonary/Chest: Effort normal and breath sounds normal. He has no wheezes.  Abdominal: Soft. Bowel sounds are normal. He exhibits no distension. There is no tenderness.  Genitourinary: Prostate normal.       Male chaperone present.   Musculoskeletal: Normal range of motion. He exhibits no edema.  Neurological: He is alert and oriented to person, place, and time. No sensory deficit.  Skin: Skin is warm and dry.  Psychiatric: He has a normal mood and affect. His behavior is normal.    ED Course  Procedures  OTHER DATA REVIEWED: Nursing notes, vital signs, and past medical records reviewed. Lab results reviewed and considered Imaging results reviewed and considered  DIAGNOSTIC STUDIES: Oxygen Saturation is 99% on room air, normal by my interpretation.    LABS / RADIOLOGY: Results for orders placed during the hospital encounter of 08/20/11  URINALYSIS, ROUTINE W REFLEX MICROSCOPIC      Component Value Range   Color, Urine YELLOW  YELLOW    Appearance CLEAR  CLEAR    Specific Gravity, Urine 1.010  1.005 - 1.030    pH 6.0  5.0 - 8.0    Glucose, UA NEGATIVE  NEGATIVE (mg/dL)   Hgb urine dipstick NEGATIVE  NEGATIVE    Bilirubin Urine NEGATIVE  NEGATIVE    Ketones, ur NEGATIVE  NEGATIVE (mg/dL)   Protein, ur NEGATIVE  NEGATIVE (mg/dL)   Urobilinogen, UA 0.2  0.0 - 1.0 (mg/dL)   Nitrite NEGATIVE  NEGATIVE    Leukocytes, UA NEGATIVE  NEGATIVE   CBC      Component Value Range   WBC 5.7  4.0 - 10.5 (K/uL)   RBC 4.27  4.22 - 5.81 (MIL/uL)   Hemoglobin 12.1 (*) 13.0 - 17.0 (g/dL)   HCT 81.1 (*) 91.4 - 52.0 (%)   MCV 86.4  78.0 - 100.0 (fL)   MCH 28.3  26.0 - 34.0 (pg)   MCHC 32.8  30.0 - 36.0 (g/dL)   RDW 78.2  95.6 - 21.3 (%)   Platelets 190  150 - 400 (K/uL)  DIFFERENTIAL      Component Value Range   Neutrophils Relative 71  43 - 77 (%)   Neutro Abs 4.1  1.7 - 7.7 (K/uL)   Lymphocytes Relative 19  12 - 46 (%)   Lymphs Abs 1.1  0.7 - 4.0  (K/uL)   Monocytes Relative 6  3 -  12 (%)   Monocytes Absolute 0.4  0.1 - 1.0 (K/uL)   Eosinophils Relative 3  0 - 5 (%)   Eosinophils Absolute 0.2  0.0 - 0.7 (K/uL)   Basophils Relative 1  0 - 1 (%)   Basophils Absolute 0.0  0.0 - 0.1 (K/uL)   No results found.  PROCEDURES:  ED COURSE / COORDINATION OF CARE: Orders Placed This Encounter  Procedures  . Urinalysis, Routine w reflex microscopic  . CBC  . Differential  . Measure post void residual     MDM:   PLAN:  The patient is to return the emergency department if there is any worsening of symptoms. I have reviewed the discharge instructions with the patient/family  CONDITION ON DISCHARGE: Patient improved.  Potassium given here.  Patient encouraged to hydrate well orally.  Patient to recheck with his doctor for rehcek of potassium and creatinine.   MEDICATIONS GIVEN IN THE E.D.  Medications  ALPRAZolam (XANAX) 1 MG tablet (not administered)  potassium chloride (MICRO-K) 10 MEQ CR capsule (not administered)  doxylamine, Sleep, (UNISOM) 25 MG tablet (not administered)  BAYER ASPIRIN PO (not administered)  Travoprost, BAK Free, (TRAVATAMN) 0.004 % SOLN ophthalmic solution (not administered)    I personally performed the services described in this documentation, which was scribed in my presence. The recorded information has been reviewed and considered. Hilario Quarry, MD         Hilario Quarry, MD 08/20/11 [phone]

## 2011-09-08 ENCOUNTER — Encounter (HOSPITAL_COMMUNITY): Payer: Self-pay | Admitting: Emergency Medicine

## 2011-09-08 ENCOUNTER — Emergency Department (HOSPITAL_COMMUNITY)
Admission: EM | Admit: 2011-09-08 | Discharge: 2011-09-08 | Disposition: A | Discharge status: Home / Self Care | Payer: Medicare FFS | Attending: Emergency Medicine | Admitting: Emergency Medicine

## 2011-09-08 ENCOUNTER — Other Ambulatory Visit: Payer: Self-pay

## 2011-09-08 DIAGNOSIS — F3289 Other specified depressive episodes: Secondary | ICD-10-CM | POA: Insufficient documentation

## 2011-09-08 DIAGNOSIS — F329 Major depressive disorder, single episode, unspecified: Secondary | ICD-10-CM | POA: Insufficient documentation

## 2011-09-08 DIAGNOSIS — R300 Dysuria: Secondary | ICD-10-CM | POA: Insufficient documentation

## 2011-09-08 DIAGNOSIS — R531 Weakness: Secondary | ICD-10-CM

## 2011-09-08 DIAGNOSIS — Z79899 Other long term (current) drug therapy: Secondary | ICD-10-CM | POA: Insufficient documentation

## 2011-09-08 DIAGNOSIS — I10 Essential (primary) hypertension: Secondary | ICD-10-CM | POA: Insufficient documentation

## 2011-09-08 DIAGNOSIS — E119 Type 2 diabetes mellitus without complications: Secondary | ICD-10-CM | POA: Insufficient documentation

## 2011-09-08 DIAGNOSIS — R5381 Other malaise: Secondary | ICD-10-CM | POA: Insufficient documentation

## 2011-09-08 LAB — DIFFERENTIAL
Eosinophils Absolute: 0.1 10*3/uL (ref 0.0–0.7)
Lymphocytes Relative: 22 % (ref 12–46)
Lymphs Abs: 1.2 10*3/uL (ref 0.7–4.0)
Monocytes Relative: 9 % (ref 3–12)
Neutro Abs: 3.7 10*3/uL (ref 1.7–7.7)
Neutrophils Relative %: 67 % (ref 43–77)

## 2011-09-08 LAB — COMPREHENSIVE METABOLIC PANEL
AST: 20 U/L (ref 0–37)
BUN: 21 mg/dL (ref 6–23)
CO2: 33 mEq/L — ABNORMAL HIGH (ref 19–32)
Calcium: 9.1 mg/dL (ref 8.4–10.5)
Chloride: 98 mEq/L (ref 96–112)
Creatinine, Ser: 1.71 mg/dL — ABNORMAL HIGH (ref 0.50–1.35)
GFR calc non Af Amer: 37 mL/min — ABNORMAL LOW (ref >90)
Total Bilirubin: 0.2 mg/dL — ABNORMAL LOW (ref 0.3–1.2)

## 2011-09-08 LAB — URINALYSIS, ROUTINE W REFLEX MICROSCOPIC
Bilirubin Urine: NEGATIVE
Hgb urine dipstick: NEGATIVE
Ketones, ur: NEGATIVE mg/dL
Specific Gravity, Urine: <1.005 — ABNORMAL LOW (ref 1.005–1.030)
Urobilinogen, UA: 0.2 mg/dL (ref 0.0–1.0)

## 2011-09-08 LAB — CBC
Hemoglobin: 11.6 g/dL — ABNORMAL LOW (ref 13.0–17.0)
Platelets: 186 10*3/uL (ref 150–400)
RBC: 4.06 MIL/uL — ABNORMAL LOW (ref 4.22–5.81)
WBC: 5.5 10*3/uL (ref 4.0–10.5)

## 2011-09-08 LAB — URINE MICROSCOPIC-ADD ON

## 2011-09-08 NOTE — ED Notes (Signed)
Sinus Brady 55 on monitor---continues to deny chest pain or chest discomfort---Wife at bedside

## 2011-09-08 NOTE — ED Notes (Signed)
nad noted. Pt a/o x3. Resp even/nonlabored. Pt states here for "low potassium and difficulty peeing" family at [facility]. cb at side. Will continue to monitor.

## 2011-09-08 NOTE — ED Notes (Signed)
Voided approx 40 ml of clear dark yellow urine--specimen sent to lab---Sinus Huston Foley 56 on monitor  B/P 110/63

## 2011-09-08 NOTE — ED Provider Notes (Signed)
Scribed for Ward Givens, MD, the patient was seen in room APA06/APA06 . This chart was scribed by Ellie Lunch. This patient's care was started at 4:04 PM.   CSN: [id number] Arrival date & time: 09/08/2011  3:39 PM  Chief Complaint  Patient presents with  . Dysuria    Also C/O low potassium    (Consider location/radiation/quality/duration/timing/severity/associated sxs/prior treatment) HPI Garrett Bradley is a 77 y.o. male who presents to the Emergency Department complaining of low potassium and difficulty urinating. Pt says he saw his PCP Dr. Phillips Odor yesterday and was told today that his potassium was low. Pt reports being seen in ED 08/20/2011 for dehydration and urinary retention. Pt has not seen urologist, but is in process of trying to see one. Pt has had difficulty urination, stating he has trouble getting started.  since taking abx for sore on leg starting 08/17/2011. PT denies v/d, abd pain, chest pain, SOB, or muscle cramps. Additionally Pt reports feeling weak and feeling a pressure like a "cap" on top of his head for the past few weeks. Pressue is constant. Not affected by position. Pt denies a change in eye sight. Pt also says he feels depressed. Depression is described as sadness because he does not feel like he should be ill.  Pt denies SI/HI. Reports Hx of depression and taking  An antidepressant as recently as 6 months ago. Depression improved with medicine.  Not sure which medicine he was on. Took medicine for a year and ran out. Did not ask for refill because he thought he would be okay. States he has difficulty sleeping.   Past Medical History  Diagnosis Date  . Hypertension   . Diabetes mellitus   Depression.   Past Surgical History  Procedure Date  . Hernia repair   . Transurethral resection of prostate     History reviewed. No pertinent family history.  History  Substance Use Topics  . Smoking status: Never Smoker   . Smokeless tobacco: Not on file  . Alcohol Use:  No  Lives with spouse  Review of Systems 10 Systems reviewed and are negative for acute change except as noted in the HPI.  Allergies  Review of patient's allergies indicates no known allergies.  Home Medications   Current Outpatient Rx  Name Route Sig Dispense Refill  . ALPRAZOLAM 1 MG PO TABS Oral Take 1 mg by mouth every 8 (eight) hours. For anxiety     . ASPIRIN EC 81 MG PO TBEC Oral Take 81 mg by mouth daily.      Marland Kitchen DILTIAZEM HCL COATED BEADS 300 MG PO CP24 Oral Take 300 mg by mouth daily.      Marland Kitchen DOXYLAMINE SUCCINATE (SLEEP) 25 MG PO TABS Oral Take 25 mg by mouth at bedtime as needed. For sleep     . OMEGA-3 FATTY ACIDS 1000 MG PO CAPS Oral Take 2 g by mouth daily.      Marland Kitchen GLIMEPIRIDE 4 MG PO TABS Oral Take 4 mg by mouth every morning.     Marland Kitchen LEVOTHYROXINE SODIUM 50 MCG PO TABS Oral Take 25 mcg by mouth daily.      Marland Kitchen LINAGLIPTIN 5 MG PO TABS Oral Take 5 mg by mouth daily.      Marland Kitchen NIACIN (ANTIHYPERLIPIDEMIC) 500 MG PO TBCR Oral Take 500 mg by mouth at bedtime.      Marland Kitchen POTASSIUM CHLORIDE CR 10 MEQ PO CPCR Oral Take 10 mEq by mouth daily.      Marland Kitchen  RAMIPRIL 10 MG PO CAPS Oral Take 10 mg by mouth 2 (two) times daily.      Marland Kitchen TERAZOSIN HCL 10 MG PO CAPS Oral Take 10 mg by mouth at bedtime.      . TRAVOPROST (BAK FREE) 0.004 % OP SOLN Both Eyes Place 1 drop into both eyes at bedtime.      Marland Kitchen HYDROCHLOROTHIAZIDE 25 MG PO TABS Oral Take 25 mg by mouth daily.        BP 150/88  Pulse 82  Temp(Src) 99 F (37.2 C) (Oral)  Resp 20  Ht 6\' 4"  (1.93 m)  Wt 286 lb (129.729 kg)  BMI 34.81 kg/m2  SpO2 99%  Physical Exam  Vitals reviewed. Constitutional: He is oriented to person, place, and time. He appears well-developed and well-nourished.  HENT:  Head: Normocephalic and atraumatic.  Eyes: Conjunctivae and EOM are normal. Pupils are equal, round, and reactive to light.  Neck: Normal range of motion. Neck supple.  Cardiovascular: Normal rate, regular rhythm and normal heart sounds.     Pulmonary/Chest: Effort normal and breath sounds normal. No respiratory distress.  Abdominal: Soft. Bowel sounds are normal.  Musculoskeletal: Normal range of motion.  Neurological: He is alert and oriented to person, place, and time.  Skin: Skin is warm and dry.  Psychiatric: His speech is normal and behavior is normal. Judgment and thought content normal. His affect is blunt.   Procedures (including critical care time)  OTHER DATA REVIEWED: Nursing notes, vital signs, and past medical records reviewed.  DIAGNOSTIC STUDIES: Oxygen Saturation is 99% on room air, normal by my interpretation.    Date: 09/08/2011  Rate: 58  Rhythm: sinus bradycardia  QRS Axis: left  Intervals: normal  ST/T Wave abnormalities: nonspecific T wave changes  Conduction Disutrbances:nonspecific intraventricular conduction delay  Narrative Interpretation:   Old EKG Reviewed: unchanged from 05/06/2011  LABS / RADIOLOGY:  Results for orders placed during the hospital encounter of 09/08/11  COMPREHENSIVE METABOLIC PANEL      Component Value Range   Sodium 139  135 - 145 (mEq/L)   Potassium 3.5  3.5 - 5.1 (mEq/L)   Chloride 98  96 - 112 (mEq/L)   CO2 33 (*) 19 - 32 (mEq/L)   Glucose, Bld 99  70 - 99 (mg/dL)   BUN 21  6 - 23 (mg/dL)   Creatinine, Ser 8.29 (*) 0.50 - 1.35 (mg/dL)   Calcium 9.1  8.4 - 56.2 (mg/dL)   Total Protein 7.2  6.0 - 8.3 (g/dL)   Albumin 3.3 (*) 3.5 - 5.2 (g/dL)   AST 20  0 - 37 (U/L)   ALT 20  0 - 53 (U/L)   Alkaline Phosphatase 66  39 - 117 (U/L)   Total Bilirubin 0.2 (*) 0.3 - 1.2 (mg/dL)   GFR calc non Af Amer 37 (*) >90 (mL/min)   GFR calc Af Amer 43 (*) >90 (mL/min)  CBC      Component Value Range   WBC 5.5  4.0 - 10.5 (K/uL)   RBC 4.06 (*) 4.22 - 5.81 (MIL/uL)   Hemoglobin 11.6 (*) 13.0 - 17.0 (g/dL)   HCT 13.0 (*) 86.5 - 52.0 (%)   MCV 86.9  78.0 - 100.0 (fL)   MCH 28.6  26.0 - 34.0 (pg)   MCHC 32.9  30.0 - 36.0 (g/dL)   RDW 78.4  69.6 - 29.5 (%)   Platelets 186   150 - 400 (K/uL)  DIFFERENTIAL      Component Value  Range   Neutrophils Relative 67  43 - 77 (%)   Neutro Abs 3.7  1.7 - 7.7 (K/uL)   Lymphocytes Relative 22  12 - 46 (%)   Lymphs Abs 1.2  0.7 - 4.0 (K/uL)   Monocytes Relative 9  3 - 12 (%)   Monocytes Absolute 0.5  0.1 - 1.0 (K/uL)   Eosinophils Relative 3  0 - 5 (%)   Eosinophils Absolute 0.1  0.0 - 0.7 (K/uL)   Basophils Relative 0  0 - 1 (%)   Basophils Absolute 0.0  0.0 - 0.1 (K/uL)  URINALYSIS, ROUTINE W REFLEX MICROSCOPIC      Component Value Range   Color, Urine YELLOW  YELLOW    Appearance CLEAR  CLEAR    Specific Gravity, Urine <1.005 (*) 1.005 - 1.030    pH 6.0  5.0 - 8.0    Glucose, UA NEGATIVE  NEGATIVE (mg/dL)   Hgb urine dipstick NEGATIVE  NEGATIVE    Bilirubin Urine NEGATIVE  NEGATIVE    Ketones, ur NEGATIVE  NEGATIVE (mg/dL)   Protein, ur NEGATIVE  NEGATIVE (mg/dL)   Urobilinogen, UA 0.2  0.0 - 1.0 (mg/dL)   Nitrite NEGATIVE  NEGATIVE    Leukocytes, UA TRACE (*) NEGATIVE   URINE MICROSCOPIC-ADD ON      Component Value Range   Squamous Epithelial / LPF RARE  RARE    WBC, UA 0-2  <3 (WBC/hpf)   Bacteria, UA RARE  RARE     ED COURSE / COORDINATION OF CARE: 20:08 Discussed lab results with Pt. No infection indicated in UA. Potassium is normal. Recommended f/u with PCP for depression.   MDM:   I personally performed the services described in this documentation, which was scribed in my presence. The recorded information has been reviewed and considered. Garrett Albe, MD, Garrett Bradley         Ward Givens, MD 09/09/11 [phone]

## 2011-09-08 NOTE — ED Notes (Signed)
Spoke with Diedra in lab concerning lab work. Blood in lab available to run test.

## 2011-09-08 NOTE — ED Notes (Signed)
No change in status--awaiting lab results---Denies chest pain or other discomforts.  Wife at bedside.

## 2011-09-08 NOTE — ED Notes (Signed)
States his doctor told him is potassium is low, patient is having a difficult time urination, "hard to go"

## 2011-09-08 NOTE — ED Notes (Signed)
States he was called by his routine MD today and advised come here for low potassium--Denies any chest pain or rhythm disturbances--Does report feeling very weak--no muscle cramps---c/o hesitancy with urination for the past three weeks..  Placed on monitor  HR 61  B/P 132/64

## 2011-09-08 NOTE — ED Notes (Signed)
No Change---awaiting lab results.  Sinus 55-65 on monitor

## 2011-09-09 LAB — URINE CULTURE
Colony Count: NO GROWTH
Culture  Setup Time: 201210030059
Culture: NO GROWTH

## 2011-09-09 LAB — TSH: TSH: 3.622 u[IU]/mL (ref 0.350–4.500)

## 2011-09-12 ENCOUNTER — Encounter (HOSPITAL_COMMUNITY): Payer: Self-pay | Admitting: Emergency Medicine

## 2011-09-12 ENCOUNTER — Emergency Department (HOSPITAL_COMMUNITY)
Admission: EM | Admit: 2011-09-12 | Discharge: 2011-09-12 | Disposition: A | Discharge status: Home / Self Care | Payer: Medicare FFS | Attending: Emergency Medicine | Admitting: Emergency Medicine

## 2011-09-12 DIAGNOSIS — I10 Essential (primary) hypertension: Secondary | ICD-10-CM | POA: Insufficient documentation

## 2011-09-12 DIAGNOSIS — R350 Frequency of micturition: Secondary | ICD-10-CM | POA: Insufficient documentation

## 2011-09-12 DIAGNOSIS — E119 Type 2 diabetes mellitus without complications: Secondary | ICD-10-CM | POA: Insufficient documentation

## 2011-09-12 DIAGNOSIS — R339 Retention of urine, unspecified: Secondary | ICD-10-CM | POA: Insufficient documentation

## 2011-09-12 DIAGNOSIS — R3911 Hesitancy of micturition: Secondary | ICD-10-CM | POA: Insufficient documentation

## 2011-09-12 LAB — URINE MICROSCOPIC-ADD ON

## 2011-09-12 LAB — URINALYSIS, ROUTINE W REFLEX MICROSCOPIC
Ketones, ur: NEGATIVE mg/dL
Leukocytes, UA: NEGATIVE
Nitrite: NEGATIVE
Protein, ur: NEGATIVE mg/dL
pH: 7.5 (ref 5.0–8.0)

## 2011-09-12 LAB — BASIC METABOLIC PANEL
Calcium: 9.2 mg/dL (ref 8.4–10.5)
Chloride: 102 mEq/L (ref 96–112)
Creatinine, Ser: 1.52 mg/dL — ABNORMAL HIGH (ref 0.50–1.35)
GFR calc Af Amer: 50 mL/min — ABNORMAL LOW (ref >90)
Sodium: 141 mEq/L (ref 135–145)

## 2011-09-12 MED ORDER — SODIUM CHLORIDE 0.9 % IV BOLUS (SEPSIS)
500.0000 mL | Freq: Once | INTRAVENOUS | Status: AC
  Administered 2011-09-12: 500 mL via INTRAVENOUS

## 2011-09-12 NOTE — ED Notes (Signed)
Catheter bag changed to leg bag.  Instruction given to pt and wife for emptying bag without contaminating sterile system.

## 2011-09-12 NOTE — ED Notes (Signed)
Foley inserted by [employer], RN. Minimal urine return, approximately 5 mls.   This RN in to check on foley drainage status. No new drainage noted. Dr. Adriana Simas made aware. VO for 500 ml NS bolus obtained.

## 2011-09-12 NOTE — ED Notes (Signed)
Pt c/o urinary retention since 6am. Pt states he has not been urinating much.

## 2011-09-12 NOTE — ED Notes (Signed)
Approx. 15 ml light yellow clear urine in bedside drainage bag---specimen obtained and taken to lab.

## 2011-09-14 ENCOUNTER — Other Ambulatory Visit (HOSPITAL_COMMUNITY): Payer: Self-pay | Admitting: Urology

## 2011-09-14 DIAGNOSIS — N200 Calculus of kidney: Secondary | ICD-10-CM

## 2011-09-14 NOTE — ED Provider Notes (Signed)
History     CSN: [id number] Arrival date & time: 09/12/2011  9:41 AM  Chief Complaint  Patient presents with  . Urinary Retention    (Consider location/radiation/quality/duration/timing/severity/associated sxs/prior treatment) HPI Unable to urinate for several hours. No fever sweats chills or dysuria. this has happened before. Nothing makes it better or worse.  Seen local urologist.  No abdominal or flank pain Past Medical History  Diagnosis Date  . Hypertension   . Diabetes mellitus     Past Surgical History  Procedure Date  . Hernia repair   . Transurethral resection of prostate     History reviewed. No pertinent family history.  History  Substance Use Topics  . Smoking status: Never Smoker   . Smokeless tobacco: Not on file  . Alcohol Use: No      Review of Systems  All other systems reviewed and are negative.    Allergies  Review of patient's allergies indicates no known allergies.  Home Medications   Current Outpatient Rx  Name Route Sig Dispense Refill  . ALPRAZOLAM 1 MG PO TABS Oral Take 1 mg by mouth every 8 (eight) hours. For anxiety     . ASPIRIN EC 81 MG PO TBEC Oral Take 81 mg by mouth daily as needed. For pain or headaches    . DILTIAZEM HCL COATED BEADS 300 MG PO CP24 Oral Take 300 mg by mouth daily.      Marland Kitchen DOXYLAMINE SUCCINATE (SLEEP) 25 MG PO TABS Oral Take 25 mg by mouth at bedtime as needed. For sleep     . OMEGA-3 FATTY ACIDS 1000 MG PO CAPS Oral Take 1 g by mouth 2 (two) times daily.     Marland Kitchen LEVOTHYROXINE SODIUM 50 MCG PO TABS Oral Take 25 mcg by mouth daily.     Marland Kitchen LINAGLIPTIN 5 MG PO TABS Oral Take 5 mg by mouth daily.      Marland Kitchen NIACIN (ANTIHYPERLIPIDEMIC) 500 MG PO TBCR Oral Take 500 mg by mouth at bedtime.      Marland Kitchen POTASSIUM CHLORIDE CR 10 MEQ PO CPCR Oral Take 20 mEq by mouth daily.     Marland Kitchen RAMIPRIL 10 MG PO CAPS Oral Take 10 mg by mouth 2 (two) times daily.      Marland Kitchen TERAZOSIN HCL 10 MG PO CAPS Oral Take 10 mg by mouth at bedtime.      .  TRAVOPROST (BAK FREE) 0.004 % OP SOLN Both Eyes Place 1 drop into both eyes at bedtime.      Marland Kitchen GLIMEPIRIDE 4 MG PO TABS Oral Take 4 mg by mouth every morning.     Marland Kitchen HYDROCHLOROTHIAZIDE 25 MG PO TABS Oral Take 25 mg by mouth daily.        BP 157/65  Pulse 69  Temp(Src) 98.6 F (37 C) (Oral)  Resp 20  Ht 6\' 4"  (1.93 m)  Wt 287 lb (130.182 kg)  BMI 34.93 kg/m2  SpO2 99%  Physical Exam  Nursing note and vitals reviewed. Constitutional: He is oriented to person, place, and time. He appears well-developed and well-nourished.  HENT:  Head: Normocephalic and atraumatic.  Eyes: Conjunctivae and EOM are normal. Pupils are equal, round, and reactive to light.  Neck: Normal range of motion. Neck supple.  Cardiovascular: Normal rate and regular rhythm.   Pulmonary/Chest: Effort normal and breath sounds normal.  Abdominal: Soft. Bowel sounds are normal.       White suprapubic discomfort  Genitourinary:       No flank pain  Musculoskeletal: Normal range of motion.  Neurological: He is alert and oriented to person, place, and time.  Skin: Skin is warm and dry.  Psychiatric: He has a normal mood and affect.    ED Course  Procedures (including critical care time)  Labs Reviewed  URINALYSIS, ROUTINE W REFLEX MICROSCOPIC - Abnormal; Notable for the following:    Hgb urine dipstick MODERATE (*)    All other components within normal limits  BASIC METABOLIC PANEL - Abnormal; Notable for the following:    Glucose, Bld 191 (*)    Creatinine, Ser 1.52 (*)    GFR calc non Af Amer 43 (*)    GFR calc Af Amer 50 (*)    All other components within normal limits  URINE MICROSCOPIC-ADD ON  LAB REPORT - SCANNED   No results found.   1. Urinary retention       MDM  Urinalysis shows hemoglobin, but no sign of infection. Will keep catheter in place. Follow up with urologist        Donnetta Hutching, MD 10/09/11 1039

## 2011-09-15 LAB — CBC
MCHC: 33.3
MCV: 90.1
Platelets: 208

## 2011-09-15 LAB — DIFFERENTIAL
Basophils Absolute: 0
Basophils Relative: 0
Eosinophils Absolute: 0 — ABNORMAL LOW
Neutrophils Relative %: 79 — ABNORMAL HIGH

## 2011-09-15 LAB — URIC ACID: Uric Acid, Serum: 7.9 — ABNORMAL HIGH

## 2011-09-16 ENCOUNTER — Ambulatory Visit (HOSPITAL_COMMUNITY)
Admission: RE | Admit: 2011-09-16 | Discharge: 2011-09-16 | Disposition: A | Discharge status: Home / Self Care | Payer: Medicare FFS | Source: Ambulatory Visit | Attending: Urology | Admitting: Urology

## 2011-09-16 DIAGNOSIS — N200 Calculus of kidney: Secondary | ICD-10-CM | POA: Insufficient documentation

## 2011-09-16 DIAGNOSIS — R3911 Hesitancy of micturition: Secondary | ICD-10-CM | POA: Insufficient documentation

## 2011-09-19 NOTE — ED Provider Notes (Signed)
History     CSN: [id number] Arrival date & time: 09/12/2011  9:41 AM  Chief Complaint  Patient presents with  . Urinary Retention    (Consider location/radiation/quality/duration/timing/severity/associated sxs/prior treatment) Patient is a 77 y.o. male presenting with dysuria. The history is provided by the patient.  Dysuria  This is a recurrent problem. The current episode started 3 to 5 hours ago. The problem has not changed (Patient woke this am with urinary retention,  having to urinate often,  but can only urinate small amounts.  Has history of bph and has had urinary retention in the past..) since onset.Quality: He denies pain,  but does have mild suprapubic pressure. The pain is at a severity of 0/10. There has been no fever. Associated symptoms include frequency and hesitancy. Pertinent negatives include no chills, no nausea, no hematuria, no urgency and no flank pain. He has tried nothing for the symptoms. His past medical history is significant for urinary stasis.    Past Medical History  Diagnosis Date  . Hypertension   . Diabetes mellitus     Past Surgical History  Procedure Date  . Hernia repair   . Transurethral resection of prostate     History reviewed. No pertinent family history.  History  Substance Use Topics  . Smoking status: Never Smoker   . Smokeless tobacco: Not on file  . Alcohol Use: No      Review of Systems  Constitutional: Negative for fever and chills.  HENT: Negative for congestion, sore throat and neck pain.   Eyes: Negative.   Respiratory: Negative for chest tightness and shortness of breath.   Cardiovascular: Negative for chest pain.  Gastrointestinal: Negative for nausea and abdominal pain.  Genitourinary: Positive for hesitancy, frequency and decreased urine volume. Negative for urgency, hematuria and flank pain.  Musculoskeletal: Negative for joint swelling and arthralgias.  Skin: Negative.  Negative for rash and wound.    Neurological: Negative for dizziness, weakness, light-headedness, numbness and headaches.  Hematological: Negative.   Psychiatric/Behavioral: Negative.     Allergies  Review of patient's allergies indicates no known allergies.  Home Medications   Current Outpatient Rx  Name Route Sig Dispense Refill  . ALPRAZOLAM 1 MG PO TABS Oral Take 1 mg by mouth every 8 (eight) hours. For anxiety     . ASPIRIN EC 81 MG PO TBEC Oral Take 81 mg by mouth daily as needed. For pain or headaches    . DILTIAZEM HCL COATED BEADS 300 MG PO CP24 Oral Take 300 mg by mouth daily.      Marland Kitchen DOXYLAMINE SUCCINATE (SLEEP) 25 MG PO TABS Oral Take 25 mg by mouth at bedtime as needed. For sleep     . OMEGA-3 FATTY ACIDS 1000 MG PO CAPS Oral Take 1 g by mouth 2 (two) times daily.     Marland Kitchen LEVOTHYROXINE SODIUM 50 MCG PO TABS Oral Take 25 mcg by mouth daily.     Marland Kitchen LINAGLIPTIN 5 MG PO TABS Oral Take 5 mg by mouth daily.      Marland Kitchen NIACIN (ANTIHYPERLIPIDEMIC) 500 MG PO TBCR Oral Take 500 mg by mouth at bedtime.      Marland Kitchen POTASSIUM CHLORIDE CR 10 MEQ PO CPCR Oral Take 20 mEq by mouth daily.     Marland Kitchen RAMIPRIL 10 MG PO CAPS Oral Take 10 mg by mouth 2 (two) times daily.      Marland Kitchen TERAZOSIN HCL 10 MG PO CAPS Oral Take 10 mg by mouth at bedtime.      Marland Kitchen  TRAVOPROST (BAK FREE) 0.004 % OP SOLN Both Eyes Place 1 drop into both eyes at bedtime.      Marland Kitchen GLIMEPIRIDE 4 MG PO TABS Oral Take 4 mg by mouth every morning.     Marland Kitchen HYDROCHLOROTHIAZIDE 25 MG PO TABS Oral Take 25 mg by mouth daily.        BP 157/65  Pulse 69  Temp(Src) 98.6 F (37 C) (Oral)  Resp 20  Ht 6\' 4"  (1.93 m)  Wt 287 lb (130.182 kg)  BMI 34.93 kg/m2  SpO2 99%  Physical Exam  Nursing note and vitals reviewed. Constitutional: He is oriented to person, place, and time. He appears well-developed and well-nourished.  HENT:  Head: Normocephalic and atraumatic.  Eyes: Conjunctivae are normal.  Neck: Normal range of motion.  Cardiovascular: Normal rate, regular rhythm, normal heart  sounds and intact distal pulses.   Pulmonary/Chest: Effort normal and breath sounds normal. He has no wheezes.  Abdominal: Soft. Bowel sounds are normal. He exhibits no distension. There is no tenderness. There is no rebound and no guarding.  Genitourinary: Penis normal.  Musculoskeletal: Normal range of motion.  Neurological: He is alert and oriented to person, place, and time.  Skin: Skin is warm and dry.  Psychiatric: He has a normal mood and affect.    ED Course  Procedures (including critical care time)  Labs Reviewed  URINALYSIS, ROUTINE W REFLEX MICROSCOPIC - Abnormal; Notable for the following:    Hgb urine dipstick MODERATE (*)    All other components within normal limits  BASIC METABOLIC PANEL - Abnormal; Notable for the following:    Glucose, Bld 191 (*)    Creatinine, Ser 1.52 (*)    GFR calc non Af Amer 43 (*)    GFR calc Af Amer 50 (*)    All other components within normal limits  URINE MICROSCOPIC-ADD ON  LAB REPORT - SCANNED   No results found.   1. Urinary retention       MDM  Foley placed with minimal urine obtained.  IV fluids given,  UA results wnl.  Patient scheduled to see his urologist Dr Jerre Simon in 2 days.  Foley left in with leg bag,  Patient comfortable at dc.    Patients labs and/or radiological studies were reviewed during the medical decision making and disposition process.         Candis Musa, PA 09/19/11 1417

## 2011-09-21 ENCOUNTER — Encounter (HOSPITAL_COMMUNITY)
Admission: RE | Admit: 2011-09-21 | Discharge: 2011-09-21 | Payer: Medicare FFS | Source: Ambulatory Visit | Attending: Urology | Admitting: Urology

## 2011-09-21 ENCOUNTER — Encounter (HOSPITAL_COMMUNITY): Payer: Self-pay

## 2011-09-21 HISTORY — DX: Major depressive disorder, single episode, unspecified: F32.9

## 2011-09-21 HISTORY — DX: Hypothyroidism, unspecified: E03.9

## 2011-09-21 HISTORY — DX: Depression, unspecified: F32.A

## 2011-09-21 LAB — BASIC METABOLIC PANEL
BUN: 24 mg/dL — ABNORMAL HIGH (ref 6–23)
CO2: 29 mEq/L (ref 19–32)
Chloride: 102 mEq/L (ref 96–112)
Creatinine, Ser: 2.16 mg/dL — ABNORMAL HIGH (ref 0.50–1.35)
Glucose, Bld: 141 mg/dL — ABNORMAL HIGH (ref 70–99)

## 2011-09-21 LAB — CBC
HCT: 34.2 % — ABNORMAL LOW (ref 39.0–52.0)
Hemoglobin: 11.3 g/dL — ABNORMAL LOW (ref 13.0–17.0)
MCH: 29 pg (ref 26.0–34.0)
MCV: 87.7 fL (ref 78.0–100.0)
Platelets: 171 10*3/uL (ref 150–400)
RBC: 3.9 MIL/uL — ABNORMAL LOW (ref 4.22–5.81)
WBC: 11.5 10*3/uL — ABNORMAL HIGH (ref 4.0–10.5)

## 2011-09-21 LAB — SURGICAL PCR SCREEN: MRSA, PCR: NEGATIVE

## 2011-09-21 NOTE — Patient Instructions (Addendum)
20 NABOR THOMANN  09/21/2011   Your procedure is scheduled on:  09/23/11  Report to [facility] at 0700 AM.  Call this number if you have problems the morning of surgery: [phone]   Remember:   Do not eat food:After Midnight.  Do not drink clear liquids: After Midnight.  Take these medicines the morning of surgery with A SIP OF WATER: xanax, cardizem, synthroid, & altace   Do not wear jewelry, make-up or nail polish.  Do not wear lotions, powders, or perfumes. You may wear deodorant.  Do not shave 48 hours prior to surgery.  Do not bring valuables to the hospital.  Contacts, dentures or bridgework may not be worn into surgery.  Leave suitcase in the car. After surgery it may be brought to your room.  For patients admitted to the hospital, checkout time is 11:00 AM the day of discharge.   Patients discharged the day of surgery will not be allowed to drive home.  Name and phone number of your driver: family  Special Instructions: use 1/2 bottle of Hibclens body wash the night before procedure & the other 1/2 of body wash the morning of procdure.   Please read over the following fact sheets that you were given: Pain Booklet, MRSA Information, Surgical Site Infection Prevention, Anesthesia Post-op Instructions and Care and Recovery After Surgery   PATIENT INSTRUCTIONS POST-ANESTHESIA  IMMEDIATELY FOLLOWING SURGERY:  Do not drive or operate machinery for the first twenty four hours after surgery.  Do not make any important decisions for twenty four hours after surgery or while taking narcotic pain medications or sedatives.  If you develop intractable nausea and vomiting or a severe headache please notify your doctor immediately.  FOLLOW-UP:  Please make an appointment with your surgeon as instructed. You do not need to follow up with anesthesia unless specifically instructed to do so.  WOUND CARE INSTRUCTIONS (if applicable):  Keep a dry clean dressing on the anesthesia/puncture wound  site if there is drainage.  Once the wound has quit draining you may leave it open to air.  Generally you should leave the bandage intact for twenty four hours unless there is drainage.  If the epidural site drains for more than 36-48 hours please call the anesthesia department.  QUESTIONS?:  Please feel free to call your physician or the hospital operator if you have any questions, and they will be happy to assist you.     [facility] Anesthesia Department [address] [place] [phone]

## 2011-09-22 ENCOUNTER — Ambulatory Visit (HOSPITAL_COMMUNITY): Payer: Medicare FFS | Admitting: Anesthesiology

## 2011-09-22 ENCOUNTER — Encounter (HOSPITAL_COMMUNITY)
Admission: RE | Disposition: A | Discharge status: Home Health | Payer: Self-pay | Source: Ambulatory Visit | Attending: Internal Medicine

## 2011-09-22 ENCOUNTER — Other Ambulatory Visit: Payer: Self-pay | Admitting: Urology

## 2011-09-22 ENCOUNTER — Encounter (HOSPITAL_COMMUNITY): Payer: Self-pay | Admitting: Anesthesiology

## 2011-09-22 ENCOUNTER — Encounter (HOSPITAL_COMMUNITY): Payer: Self-pay | Admitting: *Deleted

## 2011-09-22 ENCOUNTER — Inpatient Hospital Stay (HOSPITAL_COMMUNITY)
Admission: RE | Admit: 2011-09-22 | Discharge: 2011-10-02 | DRG: 713 | Disposition: A | Discharge status: Home Health | Payer: Medicare FFS | Source: Ambulatory Visit | Attending: Internal Medicine | Admitting: Internal Medicine

## 2011-09-22 DIAGNOSIS — I129 Hypertensive chronic kidney disease with stage 1 through stage 4 chronic kidney disease, or unspecified chronic kidney disease: Secondary | ICD-10-CM | POA: Diagnosis present

## 2011-09-22 DIAGNOSIS — E1122 Type 2 diabetes mellitus with diabetic chronic kidney disease: Secondary | ICD-10-CM | POA: Diagnosis present

## 2011-09-22 DIAGNOSIS — E870 Hyperosmolality and hypernatremia: Secondary | ICD-10-CM | POA: Diagnosis not present

## 2011-09-22 DIAGNOSIS — B370 Candidal stomatitis: Secondary | ICD-10-CM | POA: Diagnosis not present

## 2011-09-22 DIAGNOSIS — R0902 Hypoxemia: Secondary | ICD-10-CM | POA: Diagnosis not present

## 2011-09-22 DIAGNOSIS — N138 Other obstructive and reflux uropathy: Principal | ICD-10-CM | POA: Diagnosis present

## 2011-09-22 DIAGNOSIS — N179 Acute kidney failure, unspecified: Secondary | ICD-10-CM | POA: Diagnosis present

## 2011-09-22 DIAGNOSIS — I10 Essential (primary) hypertension: Secondary | ICD-10-CM | POA: Diagnosis present

## 2011-09-22 DIAGNOSIS — N390 Urinary tract infection, site not specified: Secondary | ICD-10-CM | POA: Diagnosis present

## 2011-09-22 DIAGNOSIS — G92 Toxic encephalopathy: Secondary | ICD-10-CM | POA: Diagnosis not present

## 2011-09-22 DIAGNOSIS — R6521 Severe sepsis with septic shock: Secondary | ICD-10-CM | POA: Diagnosis not present

## 2011-09-22 DIAGNOSIS — Z9079 Acquired absence of other genital organ(s): Secondary | ICD-10-CM

## 2011-09-22 DIAGNOSIS — D689 Coagulation defect, unspecified: Secondary | ICD-10-CM | POA: Diagnosis present

## 2011-09-22 DIAGNOSIS — N401 Enlarged prostate with lower urinary tract symptoms: Principal | ICD-10-CM | POA: Diagnosis present

## 2011-09-22 DIAGNOSIS — A4151 Sepsis due to Escherichia coli [E. coli]: Secondary | ICD-10-CM | POA: Diagnosis not present

## 2011-09-22 DIAGNOSIS — E876 Hypokalemia: Secondary | ICD-10-CM | POA: Diagnosis not present

## 2011-09-22 DIAGNOSIS — E039 Hypothyroidism, unspecified: Secondary | ICD-10-CM | POA: Diagnosis present

## 2011-09-22 DIAGNOSIS — G929 Unspecified toxic encephalopathy: Secondary | ICD-10-CM | POA: Diagnosis not present

## 2011-09-22 DIAGNOSIS — D696 Thrombocytopenia, unspecified: Secondary | ICD-10-CM

## 2011-09-22 DIAGNOSIS — N400 Enlarged prostate without lower urinary tract symptoms: Secondary | ICD-10-CM

## 2011-09-22 DIAGNOSIS — R110 Nausea: Secondary | ICD-10-CM | POA: Diagnosis present

## 2011-09-22 DIAGNOSIS — R112 Nausea with vomiting, unspecified: Secondary | ICD-10-CM | POA: Diagnosis present

## 2011-09-22 DIAGNOSIS — A419 Sepsis, unspecified organism: Secondary | ICD-10-CM | POA: Diagnosis not present

## 2011-09-22 DIAGNOSIS — D649 Anemia, unspecified: Secondary | ICD-10-CM | POA: Diagnosis present

## 2011-09-22 DIAGNOSIS — J9600 Acute respiratory failure, unspecified whether with hypoxia or hypercapnia: Secondary | ICD-10-CM | POA: Diagnosis not present

## 2011-09-22 DIAGNOSIS — R339 Retention of urine, unspecified: Secondary | ICD-10-CM | POA: Diagnosis present

## 2011-09-22 DIAGNOSIS — K567 Ileus, unspecified: Secondary | ICD-10-CM | POA: Diagnosis not present

## 2011-09-22 DIAGNOSIS — E1129 Type 2 diabetes mellitus with other diabetic kidney complication: Secondary | ICD-10-CM | POA: Diagnosis present

## 2011-09-22 DIAGNOSIS — N184 Chronic kidney disease, stage 4 (severe): Secondary | ICD-10-CM | POA: Diagnosis present

## 2011-09-22 HISTORY — PX: TRANSURETHRAL RESECTION OF PROSTATE: SHX73

## 2011-09-22 LAB — GLUCOSE, CAPILLARY: Glucose-Capillary: 80 mg/dL (ref 70–99)

## 2011-09-22 SURGERY — TURP (TRANSURETHRAL RESECTION OF PROSTATE)
Anesthesia: Spinal | Wound class: Clean Contaminated

## 2011-09-22 MED ORDER — ALPRAZOLAM 1 MG PO TABS
1.0000 mg | ORAL_TABLET | Freq: Three times a day (TID) | ORAL | Status: DC
  Administered 2011-09-22 – 2011-09-23 (×2): 1 mg via ORAL
  Filled 2011-09-22 (×2): qty 1

## 2011-09-22 MED ORDER — PROPOFOL 10 MG/ML IV EMUL
INTRAVENOUS | Status: DC | PRN
  Administered 2011-09-22: 40 ug/kg/min via INTRAVENOUS

## 2011-09-22 MED ORDER — STERILE WATER FOR IRRIGATION IR SOLN
Status: DC | PRN
  Administered 2011-09-22: 1000 mL

## 2011-09-22 MED ORDER — DILTIAZEM HCL ER COATED BEADS 180 MG PO CP24
180.0000 mg | ORAL_CAPSULE | Freq: Every day | ORAL | Status: DC
  Administered 2011-09-22 – 2011-09-23 (×2): 180 mg via ORAL
  Filled 2011-09-22 (×2): qty 1

## 2011-09-22 MED ORDER — ASPIRIN EC 81 MG PO TBEC
81.0000 mg | DELAYED_RELEASE_TABLET | Freq: Every day | ORAL | Status: DC
  Administered 2011-09-23: 81 mg via ORAL
  Filled 2011-09-22: qty 1

## 2011-09-22 MED ORDER — EPHEDRINE SULFATE 50 MG/ML IJ SOLN
INTRAMUSCULAR | Status: AC
  Filled 2011-09-22: qty 1

## 2011-09-22 MED ORDER — NIACIN ER (ANTIHYPERLIPIDEMIC) 500 MG PO TBCR
500.0000 mg | EXTENDED_RELEASE_TABLET | Freq: Every day | ORAL | Status: DC
  Administered 2011-09-22: 500 mg via ORAL
  Filled 2011-09-22 (×3): qty 1

## 2011-09-22 MED ORDER — LACTATED RINGERS IV SOLN
INTRAVENOUS | Status: DC
  Administered 2011-09-22: 09:00:00 via INTRAVENOUS
  Administered 2011-09-22: 1000 mL via INTRAVENOUS

## 2011-09-22 MED ORDER — ONDANSETRON HCL 4 MG/2ML IJ SOLN: 4.0000 mg | Freq: Once | INTRAMUSCULAR | Status: DC | PRN

## 2011-09-22 MED ORDER — FENTANYL CITRATE 0.05 MG/ML IJ SOLN: 25.0000 ug | INTRAMUSCULAR | Status: DC | PRN

## 2011-09-22 MED ORDER — POTASSIUM CHLORIDE CRYS ER 10 MEQ PO TBCR
10.0000 meq | EXTENDED_RELEASE_TABLET | Freq: Every day | ORAL | Status: DC
  Administered 2011-09-22 – 2011-09-23 (×2): 10 meq via ORAL
  Filled 2011-09-22 (×2): qty 1

## 2011-09-22 MED ORDER — DILTIAZEM HCL ER COATED BEADS 120 MG PO CP24
120.0000 mg | ORAL_CAPSULE | Freq: Every day | ORAL | Status: DC
  Administered 2011-09-22 – 2011-09-23 (×2): 120 mg via ORAL
  Filled 2011-09-22 (×2): qty 1

## 2011-09-22 MED ORDER — BUPIVACAINE IN DEXTROSE 0.75-8.25 % IT SOLN
INTRATHECAL | Status: AC
  Filled 2011-09-22: qty 2

## 2011-09-22 MED ORDER — LINAGLIPTIN 5 MG PO TABS
5.0000 mg | ORAL_TABLET | Freq: Every day | ORAL | Status: DC
  Administered 2011-09-23: 5 mg via ORAL
  Filled 2011-09-22 (×4): qty 1

## 2011-09-22 MED ORDER — FENTANYL CITRATE 0.05 MG/ML IJ SOLN
INTRAMUSCULAR | Status: DC | PRN
  Administered 2011-09-22: 25 ug via INTRAVENOUS

## 2011-09-22 MED ORDER — GLIMEPIRIDE 2 MG PO TABS
4.0000 mg | ORAL_TABLET | ORAL | Status: DC
Start: 2011-09-23 — End: 2011-09-23
  Administered 2011-09-23: 4 mg via ORAL
  Filled 2011-09-22: qty 2

## 2011-09-22 MED ORDER — MIDAZOLAM HCL 5 MG/5ML IJ SOLN
INTRAMUSCULAR | Status: DC | PRN
  Administered 2011-09-22: 1 mg via INTRAVENOUS

## 2011-09-22 MED ORDER — MORPHINE SULFATE 4 MG/ML IJ SOLN
2.0000 mg | INTRAMUSCULAR | Status: DC | PRN
  Administered 2011-09-22 (×2): 2 mg via INTRAVENOUS
  Filled 2011-09-22 (×2): qty 1

## 2011-09-22 MED ORDER — DEXTROSE-NACL 5-0.45 % IV SOLN
INTRAVENOUS | Status: DC
  Administered 2011-09-22 (×2): via INTRAVENOUS

## 2011-09-22 MED ORDER — MIDAZOLAM HCL 2 MG/2ML IJ SOLN
INTRAMUSCULAR | Status: AC
  Filled 2011-09-22: qty 2

## 2011-09-22 MED ORDER — DILTIAZEM HCL ER COATED BEADS 180 MG PO CP24: 300.0000 mg | ORAL_CAPSULE | Freq: Every day | ORAL | Status: DC

## 2011-09-22 MED ORDER — HYDROCHLOROTHIAZIDE 25 MG PO TABS
25.0000 mg | ORAL_TABLET | Freq: Every day | ORAL | Status: DC
  Administered 2011-09-22 – 2011-09-23 (×2): 25 mg via ORAL
  Filled 2011-09-22 (×2): qty 1

## 2011-09-22 MED ORDER — SODIUM CHLORIDE 0.9 % IR SOLN
Status: DC | PRN
  Administered 2011-09-22: 3000 mL

## 2011-09-22 MED ORDER — FENTANYL CITRATE 0.05 MG/ML IJ SOLN
INTRAMUSCULAR | Status: AC
  Filled 2011-09-22: qty 2

## 2011-09-22 MED ORDER — GLYCINE 1.5 % IR SOLN
Status: DC | PRN
  Administered 2011-09-22 (×4): 3000 mL

## 2011-09-22 MED ORDER — PROPOFOL 10 MG/ML IV EMUL
INTRAVENOUS | Status: AC
  Filled 2011-09-22: qty 20

## 2011-09-22 MED ORDER — LIDOCAINE HCL (PF) 1 % IJ SOLN
INTRAMUSCULAR | Status: AC
  Filled 2011-09-22: qty 5

## 2011-09-22 MED ORDER — EPHEDRINE SULFATE 50 MG/ML IJ SOLN
INTRAMUSCULAR | Status: DC | PRN
  Administered 2011-09-22: 10 mg via INTRAVENOUS

## 2011-09-22 MED ORDER — LEVOTHYROXINE SODIUM 25 MCG PO TABS
25.0000 ug | ORAL_TABLET | Freq: Every day | ORAL | Status: DC
  Administered 2011-09-23: 25 ug via ORAL
  Filled 2011-09-22: qty 1

## 2011-09-22 MED ORDER — MIDAZOLAM HCL 2 MG/2ML IJ SOLN
1.0000 mg | INTRAMUSCULAR | Status: DC | PRN
  Administered 2011-09-22: 2 mg via INTRAVENOUS

## 2011-09-22 MED ORDER — RAMIPRIL 10 MG PO CAPS
10.0000 mg | ORAL_CAPSULE | Freq: Two times a day (BID) | ORAL | Status: DC
  Administered 2011-09-22 – 2011-09-23 (×2): 10 mg via ORAL
  Filled 2011-09-22 (×2): qty 1

## 2011-09-22 MED ORDER — TRAVOPROST (BAK FREE) 0.004 % OP SOLN
1.0000 [drp] | Freq: Every day | OPHTHALMIC | Status: DC
  Administered 2011-09-22 – 2011-10-01 (×5): 1 [drp] via OPHTHALMIC
  Filled 2011-09-22: qty 2.5

## 2011-09-22 MED ORDER — DOXYLAMINE SUCCINATE (SLEEP) 25 MG PO TABS: 25.0000 mg | ORAL_TABLET | Freq: Every evening | ORAL | Status: DC | PRN

## 2011-09-22 SURGICAL SUPPLY — 35 items
BAG DECANTER FOR FLEXI CONT (MISCELLANEOUS) ×2 IMPLANT
BAG DRAIN URO TABLE W/ADPT NS (DRAPE) ×2 IMPLANT
BAG URINE DRAIN TURP 4L (OSTOMY) ×2 IMPLANT
CABLE HI FREQUENCY MONOPOLAR (ELECTROSURGICAL) ×2 IMPLANT
CATH FOLEY 3WAY 30CC 22F (CATHETERS) ×2 IMPLANT
CLOTH BEACON ORANGE TIMEOUT ST (SAFETY) ×2 IMPLANT
CONNECTOR 5 IN 1 STRAIGHT STRL (MISCELLANEOUS) ×2 IMPLANT
DRAPE STERI URO 23X35 APER SZ5 (DRAPE) ×2 IMPLANT
ELECT CUT LOOP C-MAX 27FR .012 (CUTTING LOOP)
ELECT REM PT RETURN 9FT ADLT (ELECTROSURGICAL) ×2
ELECTRODE CUT LP CMX 27FR .012 (CUTTING LOOP) IMPLANT
ELECTRODE REM PT RTRN 9FT ADLT (ELECTROSURGICAL) ×1 IMPLANT
FLOOR PAD 36X40 (MISCELLANEOUS)
FORMALIN 10 PREFIL 480ML (MISCELLANEOUS) ×2 IMPLANT
GLOVE BIO SURGEON STRL SZ7 (GLOVE) ×2 IMPLANT
GLOVE BIOGEL PI IND STRL 6.5 (GLOVE) ×1 IMPLANT
GLOVE BIOGEL PI IND STRL 7.0 (GLOVE) ×1 IMPLANT
GLOVE BIOGEL PI INDICATOR 6.5 (GLOVE) ×1
GLOVE BIOGEL PI INDICATOR 7.0 (GLOVE) ×1
GLOVE EXAM NITRILE MD LF STRL (GLOVE) ×2 IMPLANT
GLOVE OPTIFIT SS 6.5 STRL BRWN (GLOVE) ×2 IMPLANT
GLYCINE 1.5% IRRIG UROMATIC (IV SOLUTION) ×8 IMPLANT
GOWN BRE IMP SLV AUR XL STRL (GOWN DISPOSABLE) ×4 IMPLANT
IV NS IRRIG 3000ML ARTHROMATIC (IV SOLUTION) ×2 IMPLANT
KIT ROOM TURNOVER AP CYSTO (KITS) ×2 IMPLANT
MANIFOLD NEPTUNE II (INSTRUMENTS) ×2 IMPLANT
PACK CYSTO (CUSTOM PROCEDURE TRAY) ×2 IMPLANT
PAD ARMBOARD 7.5X6 YLW CONV (MISCELLANEOUS) ×2 IMPLANT
PAD FLOOR 36X40 (MISCELLANEOUS) IMPLANT
SET IRRIGATING DISP (SET/KITS/TRAYS/PACK) ×2 IMPLANT
SYR 30ML LL (SYRINGE) ×2 IMPLANT
TOWEL OR 17X26 4PK STRL BLUE (TOWEL DISPOSABLE) ×2 IMPLANT
WATER STERILE IRR 1000ML POUR (IV SOLUTION) ×2 IMPLANT
XPEEDA 550 SIDEFIRING FIBER (MISCELLANEOUS) IMPLANT
YANKAUER SUCT BULB TIP 10FT TU (MISCELLANEOUS) ×2 IMPLANT

## 2011-09-22 NOTE — Op Note (Signed)
H&P reviewed pt examined and there is no change in H&P.

## 2011-09-22 NOTE — Brief Op Note (Signed)
09/22/2011  10:07 AM  PATIENT:  Garrett Bradley  77 y.o. male  PRE-OPERATIVE DIAGNOSIS:  benign prostatic hypertrophy  POST-OPERATIVE DIAGNOSIS:  benign prostatic hypertrophy  PROCEDURE:  Procedure(s): TRANSURETHRAL RESECTION OF THE PROSTATE (TURP)  SURGEON:  Surgeon(s): Ky Barban  PHYSICIAN ASSISTANT:   ASSISTANTS: none   ANESTHESIA:   spinal  EBL:  Total I/O In: 1300 [I.V.:1300] Out: -   BLOOD ADMINISTERED:none  DRAINS: Urinary Catheter (Foley)   LOCAL MEDICATIONS USED:  NONE  SPECIMEN:  Source of Specimen:  prostate  DISPOSITION OF SPECIMEN:  PATHOLOGY  COUNTS:  YES  TOURNIQUET:  * No tourniquets in log *  DICTATION: .Other Dictation: Dictation Number dictation #[id number]  PLAN OF CARE: Admit for overnight observation  PATIENT DISPOSITION:  PACU - hemodynamically stable.   Delay start of Pharmacological VTE agent (>24hrs) due to surgical blood loss or risk of bleeding:      yes

## 2011-09-22 NOTE — Transfer of Care (Signed)
Immediate Anesthesia Transfer of Care Note  Patient: Garrett Bradley  Procedure(s) Performed:  TRANSURETHRAL RESECTION OF THE PROSTATE (TURP)  Patient Location: PACU  Anesthesia Type: Spinal  Level of Consciousness: awake and patient cooperative  Airway & Oxygen Therapy: Patient Spontanous Breathing and Patient connected to face mask oxygen  Post-op Assessment: Report given to PACU RN, Post -op Vital signs reviewed and stable and Patient moving all extremities  Post vital signs: Reviewed and stable  Complications: No apparent anesthesia complications

## 2011-09-22 NOTE — Anesthesia Procedure Notes (Addendum)
Spinal Block  Patient location during procedure: OR Start time: 09/22/2011 9:13 AM Staffing CRNA/Resident: Analise Glotfelty J Preanesthetic Checklist Completed: patient identified, site marked, surgical consent, pre-op evaluation, timeout performed, IV checked, risks and benefits discussed and monitors and equipment checked Spinal Block Patient position: left lateral decubitus Prep: Betadine Patient monitoring: heart rate, cardiac monitor, continuous pulse ox and blood pressure Approach: midline Location: L3-4 Injection technique: single-shot Needle Needle type: Spinocan  Needle gauge: 22 G Assessment Sensory level: T8 Additional Notes [id number]       2013-08

## 2011-09-22 NOTE — H&P (Signed)
NAME:  Garrett Bradley, Garrett Bradley               ACCOUNT NO.:  [id number]  MEDICAL RECORD NO.:  [id number]  LOCATION:  PERIO                         FACILITY:  [facility]  PHYSICIAN:  Ky Barban, M.D.DATE OF BIRTH:  [birth year]  DATE OF ADMISSION:  09/21/2011 DATE OF DISCHARGE:  LH                             HISTORY & PHYSICAL   HISTORY OF PRESENT ILLNESS:  Garrett Bradley is a 77 year old gentleman I have done TUR prostate for BPH who couple of years ago, and he was doing fine all of sudden he went into retention, has a Foley catheter.  I did a cystoscopy again today on him and he does have a little bit tight bladder neck.  There was some residual tissue around the bladder neck, and after the cystoscopy he voided with good stream, but he had 200 mL of residual urine, so I told him that we need to go ahead and resect this remaining tissue and see if it helps him to empty his bladder and he understands, wants me to go and proceed.  He will come in the morning.  I will resect his bladder neck and the prostate and keep him overnight in the hospital.  He is not having any fever, chills, or gross hematuria.  PAST MEDICAL HISTORY:  Significant in the early 2000, he was having a lot of kidney stones.  He was referred to Dr. Bryson Ha in [facility] where he was worked up.  Complete metabolic workup showed that he has hypocitruria.  He was placed on potassium citrate.  The kidney stones are not a problem anymore.  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  He has a 2 mm nonobstructing stone seen in the midportion of the right moiety and as before there is 7 x 9 x 5 mm stone seen in the lower pole of the left moiety appears slightly larger than on the previous study, 11 mm  calculus seen in the region of the left UPJ but again this appears to be external and just posterior to the UPJ. There is no hydronephrosis on either moiety.  Left hydroureter which was seen from the previous study is completely gone.  There is no stone in either ureter.  So he is fine from that point of view, and I am going to bring him in to TUR prostate again in the morning under anesthesia, keep him overnight in the hospital.  PAST MEDICAL HISTORY:  Otherwise unremarkable.  He does have hypertension and hypothyroidism.  PERSONAL HISTORY:  No history of prostate cancer.  He does not smoke or drink.  He is married lives with his wife.  PHYSICAL EXAMINATION:  VITAL SIGNS:  Blood pressure is 130/79, temperature 98.2. CENTRAL NERVOUS SYSTEM:  No gross neurological deficit. HEAD, NECK, EYE, ENT:  Negative. CHEST:  Symmetrical. HEART:  Regular sinus rhythm.  No murmur.  ABDOMEN:  Soft and flat. Liver, spleen, kidneys not palpable.  EXTERNAL GENITALIA:  Circumcised, meatus adequate.  Testicles are normal. RECTAL:  Sphincter tone is normal.  No rectal mass.  Prostate 1.5+, smooth and firm.  IMPRESSION:  Benign prostatic hypertrophy, history of renal calculi, and hypocitruria urea.  PLAN:  TUR prostate, keep him overnight in the hospital.     Ky Barban, M.D.     MIJ/MEDQ  D:  09/21/2011  T:  09/22/2011  Job:  [id number]

## 2011-09-22 NOTE — Anesthesia Postprocedure Evaluation (Signed)
  Anesthesia Post-op Note  Patient: Garrett Bradley  Procedure(s) Performed:  TRANSURETHRAL RESECTION OF THE PROSTATE (TURP)  Patient Location: PACU  Anesthesia Type: Spinal  Level of Consciousness: awake, oriented and patient cooperative  Airway and Oxygen Therapy: Patient Spontanous Breathing  Post-op Pain: none  Post-op Assessment: Post-op Vital signs reviewed, Patient's Cardiovascular Status Stable, Respiratory Function Stable, Patent Airway and No signs of Nausea or vomiting  Post-op Vital Signs: Reviewed and stable  Complications: No apparent anesthesia complications

## 2011-09-22 NOTE — Anesthesia Preprocedure Evaluation (Addendum)
Anesthesia Evaluation  Name, MR# and DOB Patient awake  General Assessment Comment  Reviewed: Allergy & Precautions, H&P , NPO status , Patient's Chart, lab work & pertinent test results  History of Anesthesia Complications Negative for: history of anesthetic complications  Airway Mallampati: III      Dental  (+) Edentulous Upper and Edentulous Lower   Pulmonary    Pulmonary exam normal       Cardiovascular hypertension, Pt. on medications Regular Normal    Neuro/Psych PSYCHIATRIC DISORDERS Depression    GI/Hepatic   Endo/Other  Diabetes mellitus-, Well Controlled, Type 2, Oral Hypoglycemic AgentsHypothyroidism   Renal/GU Renal InsufficiencyRenal disease     Musculoskeletal   Abdominal   Peds  Hematology   Anesthesia Other Findings   Reproductive/Obstetrics                           Anesthesia Physical Anesthesia Plan  ASA: III  Anesthesia Plan: Spinal   Post-op Pain Management:    Induction:   Airway Management Planned: Nasal Cannula  Additional Equipment:   Intra-op Plan:   Post-operative Plan:   Informed Consent: I have reviewed the patients History and Physical, chart, labs and discussed the procedure including the risks, benefits and alternatives for the proposed anesthesia with the patient or authorized representative who has indicated his/her understanding and acceptance.     Plan Discussed with: CRNA  Anesthesia Plan Comments:       Anesthesia Quick Evaluation

## 2011-09-23 ENCOUNTER — Ambulatory Visit (HOSPITAL_COMMUNITY): Payer: Medicare FFS

## 2011-09-23 ENCOUNTER — Inpatient Hospital Stay (HOSPITAL_COMMUNITY): Payer: Medicare FFS

## 2011-09-23 LAB — BASIC METABOLIC PANEL
BUN: 16 mg/dL (ref 6–23)
CO2: 30 mEq/L (ref 19–32)
Calcium: 8.6 mg/dL (ref 8.4–10.5)
Chloride: 99 mEq/L (ref 96–112)
Creatinine, Ser: 1.61 mg/dL — ABNORMAL HIGH (ref 0.50–1.35)

## 2011-09-23 LAB — CBC
HCT: 32.3 % — ABNORMAL LOW (ref 39.0–52.0)
MCH: 28.6 pg (ref 26.0–34.0)
MCV: 87.3 fL (ref 78.0–100.0)
Platelets: 179 10*3/uL (ref 150–400)
RBC: 3.7 MIL/uL — ABNORMAL LOW (ref 4.22–5.81)

## 2011-09-23 LAB — BLOOD GAS, ARTERIAL
Bicarbonate: 20.1 mEq/L (ref 20.0–24.0)
FIO2: 1 %
O2 Saturation: 97.9 %
Patient temperature: 37

## 2011-09-23 LAB — GLUCOSE, CAPILLARY
Glucose-Capillary: 173 mg/dL — ABNORMAL HIGH (ref 70–99)
Glucose-Capillary: 164 mg/dL — ABNORMAL HIGH (ref 70–99)

## 2011-09-23 LAB — URINE MICROSCOPIC-ADD ON

## 2011-09-23 LAB — HEPATIC FUNCTION PANEL
Alkaline Phosphatase: 141 U/L — ABNORMAL HIGH (ref 39–117)
Indirect Bilirubin: 0.3 mg/dL (ref 0.3–0.9)
Total Protein: 6.6 g/dL (ref 6.0–8.3)

## 2011-09-23 LAB — CARDIAC PANEL(CRET KIN+CKTOT+MB+TROPI)
CK, MB: 2.5 ng/mL (ref 0.3–4.0)
Relative Index: 0.9 (ref 0.0–2.5)
Troponin I: <0.3 ng/mL (ref <0.30)

## 2011-09-23 LAB — URINALYSIS, ROUTINE W REFLEX MICROSCOPIC
Nitrite: NEGATIVE
Specific Gravity, Urine: 1.01 (ref 1.005–1.030)
pH: 6 (ref 5.0–8.0)

## 2011-09-23 LAB — CARBOXYHEMOGLOBIN
Carboxyhemoglobin: 0.9 % (ref 0.5–1.5)
Methemoglobin: 1.8 % — ABNORMAL HIGH (ref 0.0–1.5)
O2 Saturation: 63.7 %
O2 Saturation: 68.2 %
Total oxygen content: 8.7 mL/dL — ABNORMAL LOW (ref 15.0–23.0)

## 2011-09-23 LAB — LIPASE, BLOOD: Lipase: 14 U/L (ref 11–59)

## 2011-09-23 MED ORDER — ACETAMINOPHEN 325 MG PO TABS
650.0000 mg | ORAL_TABLET | ORAL | Status: DC | PRN
  Administered 2011-09-23 – 2011-09-29 (×2): 650 mg via ORAL
  Filled 2011-09-23: qty 2

## 2011-09-23 MED ORDER — ACETAMINOPHEN 325 MG PO TABS: 650.0000 mg | ORAL_TABLET | Freq: Four times a day (QID) | ORAL | Status: DC | PRN

## 2011-09-23 MED ORDER — SODIUM CHLORIDE 0.9 % IV SOLN
INTRAVENOUS | Status: DC
  Administered 2011-09-24: 100 mL via INTRAVENOUS
  Administered 2011-09-26: 50 mL via INTRAVENOUS

## 2011-09-23 MED ORDER — ACETAMINOPHEN 325 MG PO TABS
ORAL_TABLET | ORAL | Status: AC
  Filled 2011-09-23: qty 2

## 2011-09-23 MED ORDER — NOREPINEPHRINE BITARTRATE 1 MG/ML IJ SOLN
INTRAMUSCULAR | Status: AC
  Filled 2011-09-23: qty 8

## 2011-09-23 MED ORDER — SODIUM CHLORIDE 0.9 % IV BOLUS (SEPSIS): 1000.0000 mL | Freq: Once | INTRAVENOUS | Status: DC

## 2011-09-23 MED ORDER — ONDANSETRON HCL 4 MG/2ML IJ SOLN
4.0000 mg | Freq: Four times a day (QID) | INTRAMUSCULAR | Status: DC | PRN
  Administered 2011-09-23 – 2011-09-28 (×3): 4 mg via INTRAVENOUS
  Filled 2011-09-23 (×3): qty 2

## 2011-09-23 MED ORDER — VASOPRESSIN 20 UNIT/ML IJ SOLN
INTRAMUSCULAR | Status: AC
  Filled 2011-09-23: qty 3

## 2011-09-23 MED ORDER — ACETAMINOPHEN 325 MG RE SUPP
RECTAL | Status: AC
  Filled 2011-09-23: qty 2

## 2011-09-23 MED ORDER — GENTAMICIN IN SALINE 1.6-0.9 MG/ML-% IV SOLN
80.0000 mg | INTRAVENOUS | Status: DC
  Administered 2011-09-23: 80 mg via INTRAVENOUS
  Filled 2011-09-23 (×2): qty 50

## 2011-09-23 MED ORDER — DOBUTAMINE IN D5W 4-5 MG/ML-% IV SOLN
INTRAVENOUS | Status: AC
  Administered 2011-09-23: 1000000 ug
  Filled 2011-09-23: qty 250

## 2011-09-23 MED ORDER — ONDANSETRON HCL 4 MG/2ML IJ SOLN: 4.0000 mg | Freq: Four times a day (QID) | INTRAMUSCULAR | Status: DC

## 2011-09-23 MED ORDER — SODIUM CHLORIDE 0.9 % IV SOLN
500.0000 mg | Freq: Three times a day (TID) | INTRAVENOUS | Status: DC
  Administered 2011-09-23 – 2011-09-24 (×3): 500 mg via INTRAVENOUS
  Filled 2011-09-23 (×3): qty 500

## 2011-09-23 MED ORDER — ACETAMINOPHEN 325 MG PO TABS
ORAL_TABLET | ORAL | Status: AC
  Administered 2011-09-23: 650 mg
  Filled 2011-09-23: qty 2

## 2011-09-23 MED ORDER — ACETAMINOPHEN 650 MG RE SUPP
650.0000 mg | RECTAL | Status: DC | PRN
  Filled 2011-09-23: qty 1

## 2011-09-23 MED ORDER — NOREPINEPHRINE BITARTRATE 1 MG/ML IJ SOLN
INTRAMUSCULAR | Status: AC
  Filled 2011-09-23: qty 4

## 2011-09-23 MED ORDER — SODIUM CHLORIDE 0.9 % IV BOLUS (SEPSIS)
1000.0000 mL | Freq: Once | INTRAVENOUS | Status: AC
  Administered 2011-09-23: 1000 mL via INTRAVENOUS

## 2011-09-23 MED ORDER — INSULIN ASPART 100 UNIT/ML ~~LOC~~ SOLN
0.0000 [IU] | Freq: Three times a day (TID) | SUBCUTANEOUS | Status: DC
  Administered 2011-09-24: 2 [IU] via SUBCUTANEOUS
  Filled 2011-09-23: qty 3

## 2011-09-23 MED ORDER — MEPERIDINE HCL 50 MG/ML IJ SOLN: 25.0000 mg | Freq: Two times a day (BID) | INTRAMUSCULAR | Status: DC | PRN

## 2011-09-23 MED ORDER — LORAZEPAM 2 MG/ML IJ SOLN: 1.0000 mg | Freq: Four times a day (QID) | INTRAMUSCULAR | Status: DC | PRN

## 2011-09-23 MED ORDER — DOBUTAMINE-DEXTROSE 2-5 MG/ML-% IV SOLN
2.0000 ug/kg/min | INTRAVENOUS | Status: DC
  Administered 2011-09-23: 2.5 ug/kg/min via INTRAVENOUS
  Filled 2011-09-23 (×14): qty 250

## 2011-09-23 MED ORDER — CIPROFLOXACIN IN D5W 200 MG/100ML IV SOLN
200.0000 mg | INTRAVENOUS | Status: DC
  Administered 2011-09-23: 200 mg via INTRAVENOUS
  Filled 2011-09-23 (×2): qty 100

## 2011-09-23 MED ORDER — METOPROLOL TARTRATE 1 MG/ML IV SOLN: 5.0000 mg | Freq: Four times a day (QID) | INTRAVENOUS | Status: DC | PRN

## 2011-09-23 MED ORDER — VANCOMYCIN HCL IN DEXTROSE 1-5 GM/200ML-% IV SOLN
1000.0000 mg | Freq: Two times a day (BID) | INTRAVENOUS | Status: DC
  Administered 2011-09-23 – 2011-09-24 (×2): 1000 mg via INTRAVENOUS
  Filled 2011-09-23 (×2): qty 200

## 2011-09-23 MED ORDER — HYDROMORPHONE HCL 1 MG/ML IJ SOLN: 0.5000 mg | INTRAMUSCULAR | Status: DC | PRN

## 2011-09-23 MED ORDER — NOREPINEPHRINE BITARTRATE 1 MG/ML IJ SOLN
2.0000 ug/min | INTRAVENOUS | Status: DC
  Administered 2011-09-23: 30 ug/min via INTRAVENOUS
  Administered 2011-09-23: 5 ug/min via INTRAVENOUS
  Administered 2011-09-23: 35 ug/min via INTRAVENOUS
  Filled 2011-09-23 (×2): qty 4

## 2011-09-23 MED ORDER — VASOPRESSIN 20 UNIT/ML IJ SOLN
0.0300 [IU]/min | INTRAVENOUS | Status: DC
  Administered 2011-09-23: 0.03 [IU]/min via INTRAVENOUS
  Filled 2011-09-23: qty 2.5

## 2011-09-23 MED ORDER — SODIUM CHLORIDE 0.9 % IV BOLUS (SEPSIS)
500.0000 mL | Freq: Once | INTRAVENOUS | Status: AC
  Administered 2011-09-23: 500 mL via INTRAVENOUS

## 2011-09-23 MED ORDER — SODIUM CHLORIDE 0.9 % IJ SOLN
INTRAMUSCULAR | Status: AC
  Filled 2011-09-23: qty 6

## 2011-09-23 MED ORDER — VANCOMYCIN HCL IN DEXTROSE 1-5 GM/200ML-% IV SOLN
INTRAVENOUS | Status: AC
  Filled 2011-09-23: qty 400

## 2011-09-23 NOTE — Progress Notes (Signed)
Patient's blood pressure is still low. The elink physician has added vasopressin. CVP is 8-9. I spoke with Dr. Jesse Fall. Transferred to my service.  Will discontinue gentamicin. Add vancomycin. Continue Primaxin. Updated family.

## 2011-09-23 NOTE — Progress Notes (Signed)
[phone] [id number]

## 2011-09-23 NOTE — Op Note (Signed)
NAME:  Garrett Bradley, Garrett Bradley               ACCOUNT NO.:  [id number]  MEDICAL RECORD NO.:  [id number]  LOCATION:  A313                          FACILITY:  [facility]  PHYSICIAN:  Ky Barban, M.D.DATE OF BIRTH:  [birth year]  DATE OF PROCEDURE:  09/22/2011 DATE OF DISCHARGE:                              OPERATIVE REPORT   PREOPERATIVE DIAGNOSIS:  Acute urinary retention.  POSTOPERATIVE DIAGNOSIS:  Benign prostatic hypertrophy.  PROCEDURE:  TUR prostate.  ANESTHESIA:  Spinal.  PROCEDURE:  The patient placed in lithotomy position under spinal anesthesia after usual prep and drape.  A #28 Iglesias resectoscope was introduced into the bladder.  It was inspected.  The bladder neck was circumferentially resected down to the circular fibers.  He had a previous TUR prostate, so I proceeded to resect the tissue from the lateral lobe, first on the right side, the obstructive tissue between 11 and 7 o'clock position.  I resected similarly the left lobe.  It was resected between 1 and 5 o'clock position.  Apical tissue was resected very carefully not to injure the sphincter of the verumontanum.  There was small amount of anterior midline tissue which was resected next. The prostatic urethra looks wide open.  Chips were evacuated.  Bleeders were coagulated.  The patient lost less than 50 mL of blood, remained stable.  Resectoscope was removed.  A #22 three-way Foley catheter was inserted.  CBI started which was clear.  The patient left the operating room in satisfactory condition.     Ky Barban, M.D.     MIJ/MEDQ  D:  09/22/2011  T:  09/22/2011  Job:  [id number]

## 2011-09-23 NOTE — Progress Notes (Addendum)
Called by nurse about lethargy. I ordered a stat set of vitals and blood glucose. She call me back and reported his systolic blood pressure was in the 70s as was his oxygen saturation. I have paged Dr. Jesse Fall, called his cell, and office. I await a call back. He has been transferred to the intensive care unit and is most likely septic and has toxic encephalopathy. He has received a fluid bolus and is now on 100% nonrebreather mask. His oxygen saturation is currently 100% and his blood pressure is 100 systolic. He is drowsy but arousable. A chest x-ray, EKG, cardiac enzymes, UA, culture and sensitivity, blood cultures, pro calcitonin, lactic acid are all pending. ABG shows adequate oxygenation on 100% nonrebreather, but he has an elevated A-a to gradient. Patient will be made n.p.o., stop antihypertensives and all by mouth medications. Central line. Followup studies. Broaden antibiotic spectrum. Patient is septic. Total critical care time 45 minutes.

## 2011-09-23 NOTE — Procedures (Signed)
Central Venous Catheter Insertion Procedure Note Garrett Bradley [id number] 09/07/1934  Procedure: Insertion of Central Venous Catheter Indications: Assessment of intravascular volume, Drug and/or fluid administration and Frequent blood sampling  Procedure Details Consent: Risks of procedure as well as the alternatives and risks of each were explained to the (patient/caregiver).  Consent for procedure obtained. Time Out: Verified patient identification, verified procedure, site/side was marked, verified correct patient position, special equipment/implants available, medications/allergies/relevent history reviewed, required imaging and test results available.  Performed  Maximum sterile technique was used including antiseptics, cap, gloves, gown, hand hygiene, mask and sheet. Skin prep: Chlorhexidine; local anesthetic administered A antimicrobial bonded/coated triple lumen catheter was placed in the left subclavian vein using the Seldinger technique.  Evaluation Blood flow good Complications: No apparent complications Patient did tolerate procedure well. Chest X-ray ordered to verify placement.  CXR: pending.  Garrett Bradley 09/23/2011, 3:59 PM

## 2011-09-23 NOTE — Progress Notes (Signed)
Co-oximetry results repeated and back. Results called to E-link.

## 2011-09-23 NOTE — Progress Notes (Signed)
eLink Physician-Brief Progress Note Patient Name: Garrett Bradley DOB: [birth year] MRN: [id number]  Date of Service  09/23/2011   HPI/Events of Note   Sepsis protocol Initiated by bedside MD. Levophed rapidly titrated to 20 mcg - MAP remains 58 -  CVP at goal Co-ox pending   eICU Interventions  Add vasopressin.      Chrisangel Eskenazi V. 09/23/2011, 6:45 PM

## 2011-09-23 NOTE — Consult Note (Signed)
ANTIBIOTIC CONSULT NOTE - INITIAL  Pharmacy Consult for Primaxin Indication: in ICU with febrile illness  No Known Allergies  Patient Measurements: Height: 6\' 4"  (193 cm) Weight: 277 lb 12.5 oz (126 kg) IBW/kg (Calculated) : 86.8   Vital Signs: Temp: 103.9 F (39.9 C) (10/17 1349) Temp src: Oral (10/17 1349) BP: 157/81 mmHg (10/17 1030) Pulse Rate: 146  (10/17 1349) Intake/Output from previous day: 10/16 0701 - 10/17 0700 In: 1750 [I.V.:1750] Out: 40981 [Urine:15950] Intake/Output from this shift: Total I/O In: 150 [IV Piggyback:150] Out: 400 [Urine:400]  Labs:  [facility] 09/23/11 0514 09/21/11 1402  WBC 7.1 11.5*  HGB 10.6* 11.3*  PLT 179 171  LABCREA -- --  CREATININE 1.61* 2.16*   Estimated Creatinine Clearance: 57.5 ml/min (by C-G formula based on Cr of 1.61). No results found for this basename: VANCOTROUGH:2,VANCOPEAK:2,VANCORANDOM:2,GENTTROUGH:2,GENTPEAK:2,GENTRANDOM:2,TOBRATROUGH:2,TOBRAPEAK:2,TOBRARND:2,AMIKACINPEAK:2,AMIKACINTROU:2,AMIKACIN:2, in the last 72 hours   Microbiology: Recent Results (from the past 720 hour(s))  URINE CULTURE     Status: Normal   Collection Time   09/08/11  5:51 PM      Component Value Range Status Comment   Specimen Description URINE, CLEAN CATCH   Final    Special Requests NONE   Final    Setup Time [id number]   Final    Colony Count NO GROWTH   Final    Culture NO GROWTH   Final    Report Status 09/09/2011 FINAL   Final   SURGICAL PCR SCREEN     Status: Normal   Collection Time   09/21/11  2:02 PM      Component Value Range Status Comment   MRSA, PCR NEGATIVE  NEGATIVE  Final    Staphylococcus aureus NEGATIVE  NEGATIVE  Final    Medical History: Past Medical History  Diagnosis Date  . Hypertension   . Diabetes mellitus   . Hypothyroidism   . Depression    Medications:  Scheduled:    . acetaminophen      . acetaminophen      . acetaminophen      . bupivacaine 0.75% in dextrose 8.25% (intrathecal)      .  ePHEDrine      . fentaNYL      . gentamicin  80 mg Intravenous Q24H  . imipenem-cilastatin  500 mg Intravenous Q8H  . insulin aspart  0-9 Units Subcutaneous TID WC  . lidocaine      . midazolam      . midazolam      . propofol      . sodium chloride  1,000 mL Intravenous Once  . sodium chloride  1,000 mL Intravenous Once  . Travoprost (BAK Free)  1 drop Both Eyes QHS  . DISCONTD: ALPRAZolam  1 mg Oral Q8H  . DISCONTD: aspirin EC  81 mg Oral Daily  . DISCONTD: ciprofloxacin  200 mg Intravenous Q24H  . DISCONTD: diltiazem  120 mg Oral Daily  . DISCONTD: diltiazem  180 mg Oral Daily  . DISCONTD: glimepiride  4 mg Oral QAM  . DISCONTD: hydrochlorothiazide  25 mg Oral Daily  . DISCONTD: levothyroxine  25 mcg Oral Daily  . DISCONTD: linagliptin  5 mg Oral Daily  . DISCONTD: niacin  500 mg Oral QHS  . DISCONTD: ondansetron (ZOFRAN) IV  4 mg Intravenous Q6H  . DISCONTD: potassium chloride  10 mEq Oral Daily  . DISCONTD: ramipril  10 mg Oral BID   Assessment: SCr improved  Goal of Therapy:  Eradicate infection.  Plan: Primaxin 500mg  iv q8hrs  Addendum: Pt was ordered Gentamicin by MD (80mg  q24hrs).  Please consider changing to GENTAMICIN PER PHARMACY PROTOCOL as pt qualifies for extended interval dosing.  Margo Aye, Judah Chevere A 09/23/2011,3:18 PM

## 2011-09-23 NOTE — Addendum Note (Signed)
Addendum  created 09/23/11 1125 by Despina Hidden   Modules edited:Notes Section

## 2011-09-23 NOTE — Addendum Note (Signed)
Addendum  created 09/23/11 1405 by Despina Hidden   Modules edited:Anesthesia Events

## 2011-09-23 NOTE — Anesthesia Postprocedure Evaluation (Signed)
  Anesthesia Post-op Note  Patient: Garrett Bradley  Procedure(s) Performed:  TRANSURETHRAL RESECTION OF THE PROSTATE (TURP)  Patient Location: room 313  Anesthesia Type: Spinal  Level of Consciousness: awake and patient cooperative  Airway and Oxygen Therapy: Patient Spontanous Breathing  Post-op Pain: 3 /10, mild  Post-op Assessment: Post-op Vital signs reviewed, Patient's Cardiovascular Status Stable, Respiratory Function Stable and Patent Airway  Post-op Vital Signs: Reviewed and stable  Complications: No apparent anesthesia complications

## 2011-09-23 NOTE — Progress Notes (Signed)
Pharmacist [facility] called for order for Vancomycin. Order received for Vancomycin 1 gram every 12 hours IV. Repeat BMET in am.

## 2011-09-23 NOTE — Consult Note (Signed)
Date of Admission:  09/22/2011  Date of Consult:  09/23/2011  Reason for Consult: New tremor  Referring Physician: Dr. Jerre Simon  PCP:  Dr. Phillips Odor  Impression/Recommendation  1. New tremor: Per nursing, this has improved. Patient was febrile earlier and I suspect he had rigors. He still has a slight tremor which may be related to medications like antiemetic, pain medication, anesthesia. He takes Xanax at home, which he has been getting here, so benzodiazepine withdrawal is less likely unless he takes much more at home than what he has been getting here. I will order IV Ativan as needed.  Also consider hyperthyroid state, though tremor was not present prior to surgery, so this appears less likely. I will check a TSH. Patient may have Demerol if another episode of rigors occurs.  2. Fever: I will check blood cultures, UA, C&S. The patient has been started on renal dose Cipro by Dr. Jesse Fall which is appropriate.   3. nausea vomiting: I will hold patient's oral hypoglycemic agents and give sliding scale for now. Check liver function tests. Hold any nonessential oral medications. Change morphine to Dilaudid in case morphine is contributing.  4. Malignant hypertension: Suspect pain and nausea related. Will order IV metoprolol as needed for systolic blood pressure above 454 or diastolic blood pressure above 098.  5. Chronic kidney disease: Patient's creatinine appears to be about 1.5 at baseline. His creatinine was over 2 initially and is now improving.  6. Type 2 diabetes: See above.  7. Hypothyroidism: See above.  8. Depression  9. BPH status post TURP   HPI: Garrett Bradley is an 77 y.o. male who was admitted post TURP. He has a history of BPH and was retaining urine. Dr. Jesse Fall perform surgery yesterday and patient had spinal anesthesia. This morning, the nurse was concerned about new tremor, so called Dr. Jesse Fall, who subsequently consult to the hospitalist. The nurse describes coarse whole  body tremor. These have since improved but he still has a slight tremor. The patient has no previous history of tremor. He had a temperature above 102 last night. His temperature is currently above 100. He received Zofran, morphine, Cipro since surgery. He is currently very nauseated and vomiting. He takes Xanax 3 times daily at home which has been continued here. There is no history of alcohol dependence or abuse. Much of the history is per nurse, old chart, and family. The patient is currently sedated after Zofran and quite nauseated still. He did have flank pain earlier which is now gone.   Past Medical History  Diagnosis Date  . Hypertension   . Diabetes mellitus   . Hypothyroidism   . Depression    Home medications:    Medications Prior to Admission  Medication Sig Dispense Refill  . ALPRAZolam (XANAX) 1 MG tablet Take 1 mg by mouth every 8 (eight) hours. For anxiety       . aspirin EC 81 MG tablet Take 81 mg by mouth daily as needed. For pain or headaches      . diltiazem (CARDIZEM CD) 300 MG 24 hr capsule Take 300 mg by mouth daily.        Marland Kitchen doxylamine, Sleep, (UNISOM) 25 MG tablet Take 25 mg by mouth at bedtime as needed. For sleep       . glimepiride (AMARYL) 4 MG tablet Take 4 mg by mouth every morning.       Marland Kitchen levothyroxine (SYNTHROID, LEVOTHROID) 50 MCG tablet Take 25 mcg by mouth daily.       Marland Kitchen  linagliptin (TRADJENTA) 5 MG TABS tablet Take 5 mg by mouth daily.        . niacin (NIASPAN) 500 MG CR tablet Take 500 mg by mouth at bedtime.        . potassium chloride (MICRO-K) 10 MEQ CR capsule Take 20 mEq by mouth daily.       . ramipril (ALTACE) 10 MG capsule Take 10 mg by mouth 2 (two) times daily.        . Travoprost, BAK Free, (TRAVATAMN) 0.004 % SOLN ophthalmic solution Place 1 drop into both eyes at bedtime.        . hydrochlorothiazide 25 MG tablet Take 25 mg by mouth daily.          Allergies: No Known Allergies  Social History:  reports that he has never smoked. He  does not have any smokeless tobacco history on file. He reports that he does not drink alcohol or use illicit drugs.  History reviewed. No pertinent family history.  Past Surgical History  Procedure Date  . Hernia repair   . Transurethral resection of prostate    Review of systems: Systems reviewed and as above otherwise negative.  Blood pressure 157/81, pulse 84, temperature 101.5 F (38.6 C), temperature source Oral, resp. rate 18, SpO2 96.00%.  BP 157/81  Pulse 84  Temp(Src) 101.5 F (38.6 C) (Oral)  Resp 18  SpO2 96%  General Appearance:    groggy. Uncomfortable with emesis basin outside. No rigors currently   Head:    Normocephalic, without obvious abnormality, atraumatic  Eyes:    PERRL, conjunctiva/corneas clear, EOM's intact, fundi    benign, both eyes          Nose:   Nares normal, septum midline, mucosa normal, no drainage   or sinus tenderness  Throat:   dry mucous membranes   Neck:   Supple, symmetrical, trachea midline, no adenopathy;       thyroid:  No enlargement/tenderness/nodules; no carotid   bruit or JVD  Back:     Symmetric, no curvature, ROM normal, no CVA tenderness  Lungs:     Clear to auscultation bilaterally, respirations unlabored  Chest wall:    No tenderness or deformity  Heart:    Regular rate and rhythm, S1 and S2 normal, no murmur, rub   or gallop  Abdomen:     obese, soft, nontender   Genitalia:    Foley catheter draining cranberry colored urine   Rectal:    deferred   Extremities:   Extremities normal, atraumatic, no cyanosis or edema  Pulses:   2+ and symmetric all extremities  Skin:   Skin color, texture, turgor normal, no rashes or lesions  Lymph nodes:   Cervical, supraclavicular, and axillary nodes normal  Neurologic:   groggy. Fine tremor in both arms. Strength intact cranial nerves intact      Psychiatric: Normal affect. Cooperative.   Results for orders placed during the hospital encounter of 09/22/11 (from the past 48 hour(s))   GLUCOSE, CAPILLARY     Status: Normal   Collection Time   09/22/11  6:50 AM      Component Value Range Comment   Glucose-Capillary 80  70 - 99 (mg/dL)   CBC     Status: Abnormal   Collection Time   09/23/11  5:14 AM      Component Value Range Comment   WBC 7.1  4.0 - 10.5 (K/uL)    RBC 3.70 (*) 4.22 - 5.81 (MIL/uL)  Hemoglobin 10.6 (*) 13.0 - 17.0 (g/dL)    HCT 44.0 (*) 10.2 - 52.0 (%)    MCV 87.3  78.0 - 100.0 (fL)    MCH 28.6  26.0 - 34.0 (pg)    MCHC 32.8  30.0 - 36.0 (g/dL)    RDW 72.5  36.6 - 44.0 (%)    Platelets 179  150 - 400 (K/uL)   BASIC METABOLIC PANEL     Status: Abnormal   Collection Time   09/23/11  5:14 AM      Component Value Range Comment   Sodium 137  135 - 145 (mEq/L)    Potassium 3.8  3.5 - 5.1 (mEq/L)    Chloride 99  96 - 112 (mEq/L)    CO2 30  19 - 32 (mEq/L)    Glucose, Bld 166 (*) 70 - 99 (mg/dL)    BUN 16  6 - 23 (mg/dL)    Creatinine, Ser 3.47 (*) 0.50 - 1.35 (mg/dL)    Calcium 8.6  8.4 - 10.5 (mg/dL)    GFR calc non Af Amer 40 (*) >90 (mL/min)    GFR calc Af Amer 47 (*) >90 (mL/min)   GLUCOSE, CAPILLARY     Status: Abnormal   Collection Time   09/23/11  8:58 AM      Component Value Range Comment   Glucose-Capillary 171 (*) 70 - 99 (mg/dL)    Comment 1 Notify RN      Comment 2 Documented in Chart      I would like to thank Dr. Jerre Simon for consulting me to assist in the care of this gentleman. I will continue to follow.  Isabell Bonafede L 09/23/2011, 11:45 AM

## 2011-09-23 NOTE — Progress Notes (Signed)
Called MD E-link with results of co-oximetry of 63.7. Orders received.

## 2011-09-23 NOTE — Procedures (Signed)
Arterial Catheter Insertion Procedure Note Garrett Bradley [id number] 26-Feb-1934  Procedure: Insertion of Arterial Catheter  Indications: Blood pressure monitoring and Frequent blood sampling  Procedure Details Consent: Risks of procedure as well as the alternatives and risks of each were explained to the (patient/caregiver).  Consent for procedure obtained. Time Out: Verified patient identification, verified procedure, site/side was marked, verified correct patient position, special equipment/implants available, medications/allergies/relevent history reviewed, required imaging and test results available.  Performed  Maximum sterile technique was used including antiseptics, cap, gloves, gown, hand hygiene, mask and sheet. Skin prep: Chlorhexidine; local anesthetic administered 20 gauge catheter was inserted into left radial artery using the Seldinger technique.  Evaluation Blood flow good; BP tracing good. Complications: No apparent complications.   Garrett Bradley [id number] 1934/02/18

## 2011-09-24 ENCOUNTER — Inpatient Hospital Stay (HOSPITAL_COMMUNITY): Payer: Medicare FFS

## 2011-09-24 DIAGNOSIS — G92 Toxic encephalopathy: Secondary | ICD-10-CM | POA: Diagnosis not present

## 2011-09-24 DIAGNOSIS — N184 Chronic kidney disease, stage 4 (severe): Secondary | ICD-10-CM | POA: Diagnosis present

## 2011-09-24 DIAGNOSIS — Z9079 Acquired absence of other genital organ(s): Secondary | ICD-10-CM

## 2011-09-24 DIAGNOSIS — N400 Enlarged prostate without lower urinary tract symptoms: Secondary | ICD-10-CM | POA: Diagnosis present

## 2011-09-24 DIAGNOSIS — R0902 Hypoxemia: Secondary | ICD-10-CM | POA: Diagnosis not present

## 2011-09-24 DIAGNOSIS — E039 Hypothyroidism, unspecified: Secondary | ICD-10-CM | POA: Diagnosis present

## 2011-09-24 DIAGNOSIS — A419 Sepsis, unspecified organism: Secondary | ICD-10-CM | POA: Diagnosis not present

## 2011-09-24 DIAGNOSIS — I10 Essential (primary) hypertension: Secondary | ICD-10-CM | POA: Diagnosis present

## 2011-09-24 DIAGNOSIS — R112 Nausea with vomiting, unspecified: Secondary | ICD-10-CM | POA: Diagnosis present

## 2011-09-24 DIAGNOSIS — E1122 Type 2 diabetes mellitus with diabetic chronic kidney disease: Secondary | ICD-10-CM | POA: Diagnosis present

## 2011-09-24 DIAGNOSIS — R6521 Severe sepsis with septic shock: Secondary | ICD-10-CM | POA: Diagnosis not present

## 2011-09-24 DIAGNOSIS — N179 Acute kidney failure, unspecified: Secondary | ICD-10-CM | POA: Diagnosis present

## 2011-09-24 DIAGNOSIS — D649 Anemia, unspecified: Secondary | ICD-10-CM | POA: Diagnosis present

## 2011-09-24 LAB — HEPATIC FUNCTION PANEL
Alkaline Phosphatase: 61 U/L (ref 39–117)
Indirect Bilirubin: 0.1 mg/dL — ABNORMAL LOW (ref 0.3–0.9)
Total Protein: 5.7 g/dL — ABNORMAL LOW (ref 6.0–8.3)

## 2011-09-24 LAB — BASIC METABOLIC PANEL
BUN: 28 mg/dL — ABNORMAL HIGH (ref 6–23)
CO2: 22 mEq/L (ref 19–32)
Calcium: 7.2 mg/dL — ABNORMAL LOW (ref 8.4–10.5)
Chloride: 100 mEq/L (ref 96–112)
Creatinine, Ser: 3.24 mg/dL — ABNORMAL HIGH (ref 0.50–1.35)
GFR calc Af Amer: 20 mL/min — ABNORMAL LOW (ref >90)
GFR calc non Af Amer: 17 mL/min — ABNORMAL LOW (ref >90)
Glucose, Bld: 196 mg/dL — ABNORMAL HIGH (ref 70–99)
Potassium: 3.5 mEq/L (ref 3.5–5.1)
Sodium: 136 mEq/L (ref 135–145)

## 2011-09-24 LAB — BLOOD GAS, ARTERIAL
Acid-base deficit: 5.6 mmol/L — ABNORMAL HIGH (ref 0.0–2.0)
Patient temperature: 37
TCO2: 18.6 mmol/L (ref 0–100)
pCO2 arterial: 40 mmHg (ref 35.0–45.0)
pH, Arterial: 7.311 — ABNORMAL LOW (ref 7.350–7.450)

## 2011-09-24 LAB — DIFFERENTIAL
Basophils Absolute: 0 10*3/uL (ref 0.0–0.1)
Basophils Relative: 0 % (ref 0–1)
Eosinophils Absolute: 0 10*3/uL (ref 0.0–0.7)
Eosinophils Relative: 0 % (ref 0–5)
Monocytes Absolute: 0.8 10*3/uL (ref 0.1–1.0)
Monocytes Relative: 4 % (ref 3–12)

## 2011-09-24 LAB — GLUCOSE, CAPILLARY
Glucose-Capillary: 135 mg/dL — ABNORMAL HIGH (ref 70–99)
Glucose-Capillary: 177 mg/dL — ABNORMAL HIGH (ref 70–99)

## 2011-09-24 LAB — CBC
HCT: 30.1 % — ABNORMAL LOW (ref 39.0–52.0)
Hemoglobin: 9.6 g/dL — ABNORMAL LOW (ref 13.0–17.0)
MCH: 28.2 pg (ref 26.0–34.0)
MCHC: 31.9 g/dL (ref 30.0–36.0)
RDW: 15 % (ref 11.5–15.5)

## 2011-09-24 LAB — PROTIME-INR
INR: 1.77 — ABNORMAL HIGH (ref 0.00–1.49)
Prothrombin Time: 20.9 seconds — ABNORMAL HIGH (ref 11.6–15.2)

## 2011-09-24 LAB — PHOSPHORUS: Phosphorus: 2.4 mg/dL (ref 2.3–4.6)

## 2011-09-24 LAB — T3, FREE: T3, Free: 1.4 pg/mL — ABNORMAL LOW (ref 2.3–4.2)

## 2011-09-24 LAB — MAGNESIUM: Magnesium: 1.3 mg/dL — ABNORMAL LOW (ref 1.5–2.5)

## 2011-09-24 LAB — TSH: TSH: 7.139 u[IU]/mL — ABNORMAL HIGH (ref 0.350–4.500)

## 2011-09-24 MED ORDER — LEVOTHYROXINE SODIUM 100 MCG IV SOLR
25.0000 ug | Freq: Every day | INTRAVENOUS | Status: DC
  Administered 2011-09-24: 26 ug via INTRAVENOUS
  Filled 2011-09-24 (×5): qty 1.3

## 2011-09-24 MED ORDER — MAGNESIUM SULFATE 50 % IJ SOLN: 2.0000 g | Freq: Once | INTRAVENOUS | Status: DC

## 2011-09-24 MED ORDER — ALPRAZOLAM 0.5 MG PO TABS
0.5000 mg | ORAL_TABLET | Freq: Two times a day (BID) | ORAL | Status: DC | PRN
  Administered 2011-09-24 – 2011-09-30 (×4): 0.5 mg via ORAL
  Filled 2011-09-24 (×4): qty 1

## 2011-09-24 MED ORDER — SODIUM CHLORIDE 0.9 % IJ SOLN
INTRAMUSCULAR | Status: AC
  Filled 2011-09-24: qty 10

## 2011-09-24 MED ORDER — INSULIN ASPART 100 UNIT/ML ~~LOC~~ SOLN
0.0000 [IU] | SUBCUTANEOUS | Status: DC
  Administered 2011-09-24 (×2): 2 [IU] via SUBCUTANEOUS
  Administered 2011-09-24 (×2): 1 [IU] via SUBCUTANEOUS
  Administered 2011-09-26: 2 [IU] via SUBCUTANEOUS
  Administered 2011-09-26: 1 [IU] via SUBCUTANEOUS
  Administered 2011-09-27: 2 [IU] via SUBCUTANEOUS
  Administered 2011-09-27: 3 [IU] via SUBCUTANEOUS
  Administered 2011-09-27 – 2011-09-28 (×5): 2 [IU] via SUBCUTANEOUS
  Administered 2011-09-28: 3 [IU] via SUBCUTANEOUS
  Administered 2011-09-28 (×2): 2 [IU] via SUBCUTANEOUS
  Administered 2011-09-28: 3 [IU] via SUBCUTANEOUS
  Administered 2011-09-28: 2 [IU] via SUBCUTANEOUS
  Administered 2011-09-29 (×2): 3 [IU] via SUBCUTANEOUS
  Administered 2011-09-29: 1 [IU] via SUBCUTANEOUS

## 2011-09-24 MED ORDER — INSULIN GLARGINE 100 UNIT/ML ~~LOC~~ SOLN
5.0000 [IU] | SUBCUTANEOUS | Status: DC
  Administered 2011-09-24 – 2011-09-30 (×6): 5 [IU] via SUBCUTANEOUS
  Filled 2011-09-24: qty 3

## 2011-09-24 MED ORDER — BIOTENE DRY MOUTH MT LIQD
OROMUCOSAL | Status: DC
  Administered 2011-09-24: 20:00:00 via OROMUCOSAL
  Administered 2011-09-24 (×2): 1 via OROMUCOSAL
  Administered 2011-09-25 – 2011-09-29 (×24): via OROMUCOSAL
  Administered 2011-09-29 (×2): 1 via OROMUCOSAL
  Administered 2011-09-29 – 2011-10-02 (×11): via OROMUCOSAL

## 2011-09-24 MED ORDER — IMIPENEM-CILASTATIN 250 MG IV SOLR
250.0000 mg | Freq: Four times a day (QID) | INTRAVENOUS | Status: DC
  Administered 2011-09-24 – 2011-09-27 (×12): 250 mg via INTRAVENOUS
  Filled 2011-09-24 (×17): qty 250

## 2011-09-24 MED ORDER — SODIUM CHLORIDE 0.9 % IJ SOLN
10.0000 mL | Freq: Two times a day (BID) | INTRAMUSCULAR | Status: DC
  Administered 2011-09-25 – 2011-10-01 (×10): 10 mL
  Filled 2011-09-24 (×7): qty 10
  Filled 2011-09-24 (×2): qty 20
  Filled 2011-09-24 (×2): qty 10

## 2011-09-24 MED ORDER — SODIUM CHLORIDE 0.9 % IJ SOLN
10.0000 mL | INTRAMUSCULAR | Status: DC | PRN
  Administered 2011-09-30: 10 mL
  Filled 2011-09-24: qty 20
  Filled 2011-09-24: qty 10
  Filled 2011-09-24: qty 20
  Filled 2011-09-24: qty 10

## 2011-09-24 MED ORDER — MAGNESIUM SULFATE 40 MG/ML IJ SOLN
2.0000 g | Freq: Once | INTRAMUSCULAR | Status: AC
  Administered 2011-09-24: 2 g via INTRAVENOUS
  Filled 2011-09-24: qty 50

## 2011-09-24 MED ORDER — NOREPINEPHRINE BITARTRATE 1 MG/ML IJ SOLN
2.0000 ug/min | INTRAVENOUS | Status: DC
  Administered 2011-09-24: 40 ug/min via INTRAVENOUS
  Administered 2011-09-24: 32.5 ug/min via INTRAVENOUS
  Administered 2011-09-24: 20 ug/min via INTRAVENOUS
  Administered 2011-09-24: 35 ug/min via INTRAVENOUS
  Administered 2011-09-24: 22.5 ug/min via INTRAVENOUS
  Administered 2011-09-24: 30 ug/min via INTRAVENOUS
  Filled 2011-09-24 (×2): qty 16

## 2011-09-24 MED ORDER — HYDROCORTISONE SOD SUCCINATE 100 MG IJ SOLR
50.0000 mg | Freq: Four times a day (QID) | INTRAMUSCULAR | Status: DC
  Administered 2011-09-24 – 2011-09-25 (×4): 50 mg via INTRAVENOUS
  Administered 2011-09-25: 12:00:00 via INTRAVENOUS
  Administered 2011-09-26 (×2): 50 mg via INTRAVENOUS
  Filled 2011-09-24 (×7): qty 2

## 2011-09-24 NOTE — ED Provider Notes (Signed)
Medical screening examination/treatment/procedure(s) were conducted as a shared visit with non-physician practitioner(s) and myself.  I personally evaluated the patient during the encounter... C/o urinary retention;  ua neg for infection  Donnetta Hutching, MD 09/24/11 2201

## 2011-09-24 NOTE — Progress Notes (Signed)
NAME:  Garrett Bradley, BEDWELL NO.:  [id number]  MEDICAL RECORD NO.:  [id number]  LOCATION:  IC07                          FACILITY:  [facility]  PHYSICIAN:  Ky Barban, M.D.DATE OF BIRTH:  03-08-1934  DATE OF PROCEDURE: DATE OF DISCHARGE:                                PROGRESS NOTE   [name] who has gone into septic shock after having a TUR prostate day before yesterday.  Yesterday, he developed this infection and at this point, he is arousable, little bit sleepy, but he complains that he is not feeling well, but not any worse than yesterday and his blood pressures or systolic is running 103.  Urine output seems to be adequate.  His urine output last 1 hour was 60 mL.  His electrolytes seemed to be doing okay.  His sodium is 136, potassium 3.5, chloride 22, glucose is 196, BUN is 28, creatinine 3.24, his creatinine has jumped up.  He has gone into acute renal failure, but urine output seems to be adequate and his WBC count 21.8 and hematocrit 30.1.  His CVP is running 10, pulse is around 130 per minute regular.  IMPRESSION:  Dr. Lendell Caprice hospitalists is managing this condition, and I am satisfied with it.  His urine looks almost clear and his blood and urine culture are still pending.  I have discussed these findings with the family.  His wife called and told that he is in critical situation, but stable.     Ky Barban, M.D.     MIJ/MEDQ  D:  09/24/2011  T:  09/24/2011  Job:  [id number]

## 2011-09-24 NOTE — Progress Notes (Addendum)
While performing renal ultrasound, the tech noted that the bladder was full with an estimated 630 ml. 3-way catheter was irrigated by RN with a total of of NS. Returned a total of of dark urine along with old blood and sediment. Pt. Stated that he did not have the sensation that his bladder was full and tolerated the irrigation with minor  Pressure/discomfort. Dr. Jerre Simon notified.

## 2011-09-24 NOTE — Consult Note (Signed)
ANTIBIOTIC CONSULT NOTE   Pharmacy Consult for Primaxin & Vancomycin Indication: in ICU with febrile illness, sepsis  No Known Allergies  Patient Measurements: Height: 6\' 4"  (193 cm) Weight: 291 lb 0.1 oz (132 kg) IBW/kg (Calculated) : 86.8   Vital Signs: Temp: 99.2 F (37.3 C) (10/18 0800) Temp src: Oral (10/18 0800) BP: 91/48 mmHg (10/18 0800) Pulse Rate: 102  (10/18 0800) Intake/Output from previous day: 10/17 0701 - 10/18 0700 In: 2684.4 [I.V.:2234.4; IV Piggyback:450] Out: 1102 [Urine:1102] Intake/Output from this shift: Total I/O In: 200 [I.V.:200] Out: -   Labs:  Basename 09/24/11 0457 09/23/11 0514 09/21/11 1402  WBC 21.8* 7.1 11.5*  HGB 9.6* 10.6* 11.3*  PLT 96* 179 171  LABCREA -- -- --  CREATININE 3.24* 1.61* 2.16*   Estimated Creatinine Clearance: 29.2 ml/min (by C-G formula based on Cr of 3.24). No results found for this basename: VANCOTROUGH:2,VANCOPEAK:2,VANCORANDOM:2,GENTTROUGH:2,GENTPEAK:2,GENTRANDOM:2,TOBRATROUGH:2,TOBRAPEAK:2,TOBRARND:2,AMIKACINPEAK:2,AMIKACINTROU:2,AMIKACIN:2, in the last 72 hours   Microbiology: Recent Results (from the past 720 hour(s))  URINE CULTURE     Status: Normal   Collection Time   09/08/11  5:51 PM      Component Value Range Status Comment   Specimen Description URINE, CLEAN CATCH   Final    Special Requests NONE   Final    Setup Time [id number]   Final    Colony Count NO GROWTH   Final    Culture NO GROWTH   Final    Report Status 09/09/2011 FINAL   Final   SURGICAL PCR SCREEN     Status: Normal   Collection Time   09/21/11  2:02 PM      Component Value Range Status Comment   MRSA, PCR NEGATIVE  NEGATIVE  Final    Staphylococcus aureus NEGATIVE  NEGATIVE  Final    Medical History: Past Medical History  Diagnosis Date  . Hypertension   . Diabetes mellitus   . Hypothyroidism   . Depression    Medications:  Scheduled:     . acetaminophen      . acetaminophen      . acetaminophen      . DOBUTamine       . imipenem-cilastatin  250 mg Intravenous Q6H  . insulin aspart  0-9 Units Subcutaneous TID WC  . sodium chloride  1,000 mL Intravenous Once  . sodium chloride  1,000 mL Intravenous Once  . sodium chloride  1,000 mL Intravenous Once  . sodium chloride  500 mL Intravenous Once  . sodium chloride  10 mL Intracatheter Q12H  . sodium chloride      . sodium chloride      . Travoprost (BAK Free)  1 drop Both Eyes QHS  . DISCONTD: ALPRAZolam  1 mg Oral Q8H  . DISCONTD: aspirin EC  81 mg Oral Daily  . DISCONTD: ciprofloxacin  200 mg Intravenous Q24H  . DISCONTD: diltiazem  120 mg Oral Daily  . DISCONTD: diltiazem  180 mg Oral Daily  . DISCONTD: gentamicin  80 mg Intravenous Q24H  . DISCONTD: glimepiride  4 mg Oral QAM  . DISCONTD: hydrochlorothiazide  25 mg Oral Daily  . DISCONTD: imipenem-cilastatin  500 mg Intravenous Q8H  . DISCONTD: levothyroxine  25 mcg Oral Daily  . DISCONTD: linagliptin  5 mg Oral Daily  . DISCONTD: niacin  500 mg Oral QHS  . DISCONTD: ondansetron (ZOFRAN) IV  4 mg Intravenous Q6H  . DISCONTD: potassium chloride  10 mEq Oral Daily  . DISCONTD: ramipril  10 mg Oral BID  .  DISCONTD: vancomycin  1,000 mg Intravenous Q12H   Assessment: SCr WORSENING Renal fxn worsening  Goal of Therapy:  Eradicate infection. Vancomycin trough level 15-20  Plan: Decrease Primaxin to 250mg  iv q6hrs  Hold vancomycin today and check random level tomorrow to adjust dosing F/U Scr tomorrow  Margo Aye, Hadley Detloff A 09/24/2011,8:21 AM

## 2011-09-24 NOTE — Progress Notes (Signed)
Cultures growing gram-negative rods. Urine culture growing Escherichia coli. I will continue Primaxin. Stop vancomycin. Exam unchanged from earlier this morning. Discussed with nursing and patient's wife. Ultrasound is pending. Vasopressin is off. Blood pressure is better and levo fed is being weaned.

## 2011-09-24 NOTE — Progress Notes (Signed)
NAME:  PIERRE, DELLAROCCO NO.:  [id number]  MEDICAL RECORD NO.:  [id number]  LOCATION:  IC07                          FACILITY:  [facility]  PHYSICIAN:  Ky Barban, M.D.DATE OF BIRTH:  1934/06/29  DATE OF PROCEDURE:  09/23/2011 DATE OF DISCHARGE:                                PROGRESS NOTE   HISTORY:  Mr. Merrow had a TUR prostate yesterday, I was called around 8 o'clock this morning with the nurse.  He is running low-grade temperature around 100 and so I said do a urine culture and start on Cipro 200 mg IV daily.  He was otherwise fin.  His blood pressure was normal and then I told her that I will come and see him after the office and so around at 12 o'clock I came and seen him, his temperature as go numb up to 103, he was alert and oriented.  His blood pressure was is stable, putting out good urine, he was not complaining of any pain. Abdomen was soft.  Urine looks slightly blood tinged, so I decided to put him on gentamicin, I ordered 80 mg IV gentamicin stat ordered through pharmacy can continue with this and also in the morning when I talked to the nurse he was having little tremors in upper extremities. So I told her to get in a hospitalist consult and when I came around lunch time, he still awaiting for the hospitalist to see him.  Then, I went to the operating room, I came out around 2:30, at that time they were looking for me just before that patient's blood pressure has dropped to 70, he was lethargic, so the hospitalist team was seen the patient and moved him to ICU because his temperature was high.  Oxygen saturation was low.  Blood pressure dropped to 70 although he was arousable, but lethargic and.  IMPRESSION:  He has gone into septic shock, so hospitalist changed his antibiotics.  At this point, I came to see him around 2:45, the patient is still lethargic but he is arousable.  Blood pressure is around 75 systolic.  His oxygen saturation on  Ventimask is 100%.  His urine output looks satisfactory.  So I am going to put him on 2 hourly urine output and evaluate.  He is getting IV saline bolus after that his blood pressure does not come up, I think we may have to put him on dopamine but hospitalist service is managing all that.  And I talked to the patient again he says that his not hurting anywhere, abdomen is soft, nondistended, and we will see what happens.     Ky Barban, M.D.     MIJ/MEDQ  D:  09/23/2011  T:  09/24/2011  Job:  [id number]

## 2011-09-24 NOTE — Progress Notes (Signed)
Overnight, the patient was started on dobutamine. Remains on levo fed and vasopressin. Per nursing, oriented.  Subjective: Slightly short of breath. No pain.  Objective: Vital signs in last 24 hours: Filed Vitals:   09/24/11 0800 09/24/11 0815 09/24/11 0830 09/24/11 0845  BP: 91/48 105/53 85/66 95/48   Pulse: 102 102 107 99  Temp: 99.2 F (37.3 C)     TempSrc: Oral     Resp: 30 24 30 26   Height:      Weight: 132 kg (291 lb 0.1 oz)     SpO2: 92% 93% 91% 92%    CVP is 8  Weight change:   Intake/Output Summary (Last 24 hours) at 09/24/11 0919 Last data filed at 09/24/11 0900  Gross per 24 hour  Intake 3418.92 ml  Output   1222 ml  Net 2196.92 ml   General: Asleep, arousable. Answers questions appropriately Lungs: Tachypneic, mild expiratory wheeze, good air movement Cardiovascular regular fast Abdomen bowel sounds present soft nontender nondistended Extremities warm diminished pulses no edema  Lab Results: Basic Metabolic Panel:  Lab 09/24/11 9604 09/24/11 0430 09/23/11 0514  NA 136 -- 137  K 3.5 -- 3.8  CL 100 -- 99  CO2 22 -- 30  GLUCOSE 196* -- 166*  BUN 28* -- 16  CREATININE 3.24* -- 1.61*  CALCIUM 7.2* -- 8.6  MG -- 1.3* --  PHOS -- 2.4 --   Liver Function Tests:  Lab 09/23/11 1145  AST 16  ALT 12  ALKPHOS 141*  BILITOT 0.8  PROT 6.6  ALBUMIN 2.6*    Lab 09/23/11 1145  LIPASE 14  AMYLASE --   No results found for this basename: AMMONIA:2 in the last 168 hours CBC:  Lab 09/24/11 0457 09/23/11 0514  WBC 21.8* 7.1  NEUTROABS 20.6* --  HGB 9.6* 10.6*  HCT 30.1* 32.3*  MCV 88.5 87.3  PLT 96* 179   Cardiac Enzymes:  Lab 09/23/11 1419  CKTOTAL 289*  CKMB 2.5  CKMBINDEX --  TROPONINI <0.30   BNP:  Lab 09/23/11 1358  POCBNP 882.4*   D-Dimer: No results found for this basename: DDIMER:2 in the last 168 hours CBG:  Lab 09/24/11 0729 09/23/11 2151 09/23/11 1736 09/23/11 1359 09/23/11 0858 09/22/11 0650  GLUCAP 185* 164* 108* 173*  171* 80   Hemoglobin A1C: No results found for this basename: HGBA1C in the last 168 hours Fasting Lipid Panel: No results found for this basename: CHOL,HDL,LDLCALC,TRIG,CHOLHDL,LDLDIRECT in the last 540 hours Thyroid Function Tests:  Lab 09/23/11 1145  TSH 7.139*  T4TOTAL --  FREET4 --  T3FREE --  THYROIDAB --   Anemia Panel: No results found for this basename: VITAMINB12,FOLATE,FERRITIN,TIBC,IRON,RETICCTPCT in the last 168 hours  Alcohol Level: No results found for this basename: ETH:2 in the last 168 hours Urinalysis: Specific gravity 1.010. PH 6.0. Negative nitrites. Small leukocyte esterase. 21-50 white blood cells. Too numerous to count red cells. Many bacteria.  ABG    Component Value Date/Time   PHART 7.311* 09/24/2011 0915   HCO3 19.6* 09/24/2011 0915   TCO2 18.6 09/24/2011 0915   ACIDBASEDEF 5.6* 09/24/2011 0915   O2SAT 93.9 09/24/2011 0915    Micro Results: Recent Results (from the past 240 hour(s))  SURGICAL PCR SCREEN     Status: Normal   Collection Time   09/21/11  2:02 PM      Component Value Range Status Comment   MRSA, PCR NEGATIVE  NEGATIVE  Final    Staphylococcus aureus NEGATIVE  NEGATIVE  Final  Studies/Results: Dg Chest Portable 1 View  09/23/2011  *RADIOLOGY REPORT*  Clinical Data: Sepsis, evaluate for pneumonia  PORTABLE CHEST - 1 VIEW  Comparison: Portable chest x-ray of 05/13/2009  Findings: There is mild basilar atelectasis present.  Cardiomegaly is stable.  No focal infiltrate or effusion is seen.  IMPRESSION: Stable cardiomegaly.  Mild basilar atelectasis.  Original Report Authenticated By: Juline Patch, M.D.   Dg Chest Port 1v Same Day  09/23/2011  *RADIOLOGY REPORT*  Clinical Data: Left central line placement.  PORTABLE CHEST - 1 VIEW SAME DAY  Comparison: Chest radiograph 09/23/2011  Findings: Cardiac leads project over the chest.  A left subclavian central venous catheter is in place.  The distal tip is in the left brachiocephalic  vein.  Cardiomegaly is stable.  Lung volumes are low.  Mild bibasilar atelectasis is similar to recent prior examination.  No pneumothorax, pleural effusion or airspace disease is identified.  IMPRESSION: Left subclavian central venous catheter terminates in the left brachiocephalic vein.  No complicating feature identified.  Cardiomegaly and bibasilar atelectasis.  Original Report Authenticated By: Britta Mccreedy, M.D.   Scheduled Meds:   . acetaminophen      . acetaminophen      . DOBUTamine      . imipenem-cilastatin  250 mg Intravenous Q6H  . insulin aspart  0-9 Units Subcutaneous Q4H  . levothyroxine  26 mcg Intravenous Daily  . magnesium sulfate infusion  2 g Intravenous Once  . sodium chloride  1,000 mL Intravenous Once  . sodium chloride  1,000 mL Intravenous Once  . sodium chloride  1,000 mL Intravenous Once  . sodium chloride  500 mL Intravenous Once  . sodium chloride  10 mL Intracatheter Q12H  . sodium chloride      . Travoprost (BAK Free)  1 drop Both Eyes QHS  . DISCONTD: ALPRAZolam  1 mg Oral Q8H  . DISCONTD: aspirin EC  81 mg Oral Daily  . DISCONTD: ciprofloxacin  200 mg Intravenous Q24H  . DISCONTD: diltiazem  120 mg Oral Daily  . DISCONTD: diltiazem  180 mg Oral Daily  . DISCONTD: gentamicin  80 mg Intravenous Q24H  . DISCONTD: glimepiride  4 mg Oral QAM  . DISCONTD: hydrochlorothiazide  25 mg Oral Daily  . DISCONTD: imipenem-cilastatin  500 mg Intravenous Q8H  . DISCONTD: insulin aspart  0-9 Units Subcutaneous TID WC  . DISCONTD: levothyroxine  25 mcg Oral Daily  . DISCONTD: linagliptin  5 mg Oral Daily  . DISCONTD: niacin  500 mg Oral QHS  . DISCONTD: ondansetron (ZOFRAN) IV  4 mg Intravenous Q6H  . DISCONTD: potassium chloride  10 mEq Oral Daily  . DISCONTD: ramipril  10 mg Oral BID  . DISCONTD: sodium chloride      . DISCONTD: vancomycin  1,000 mg Intravenous Q12H   Continuous Infusions:   . sodium chloride Stopped (09/24/11 0800)  . DOButamine 2.5  mcg/kg/min (09/24/11 0900)  . norepinephrine (LEVOPHED) Adult infusion 45 mcg/min (09/24/11 0900)  . vasopressin (PITRESSIN) infusion - *FOR SHOCK* 0.03 Units/min (09/24/11 0900)  . DISCONTD: dextrose 5 % and 0.45% NaCl 100 mL/hr at 09/22/11 2326  . DISCONTD: norepinephrine (LEVOPHED) Adult infusion 35 mcg/min (09/24/11 0000)   PRN Meds:.acetaminophen, acetaminophen, ondansetron (ZOFRAN) IV, sodium chloride, DISCONTD: acetaminophen, DISCONTD:  HYDROmorphone (DILAUDID) injection, DISCONTD: LORazepam, DISCONTD: meperidine, DISCONTD: metoprolol, DISCONTD:  morphine injection Assessment/Plan: Principal Problem:  *Septic shock secondary to UTI, recent instrumentation: Continue broad-spectrum antibiotics. Followup culture. Continue vasopressors and dobutamine   ARF (  acute renal failure) secondary to sepsis/ATN/prerenal most likely, however will get ultrasound to rule out hydronephrosis. Urine output has been adequate.   Toxic encephalopathy: Ice chips only until more alert.   S/P TURP   Hypoxemia: His oxygen requirements are less today. His tachypnea may be related to the metabolic acidosis/compensation however, I will check another chest x-ray today to rule out fluid overload or developing ARDS.   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   Hypothyroidism: TSH is 7. Patient had been on 25 mcg of levothyroxine daily. Will give 25 mcg IV until able to take oral medications. Will need a higher dose of levothyroxine once taking by mouth.   CKD (chronic kidney disease), stage IV   Nausea & vomiting: Resolved   Benign hypertension   Anemia: Multifactorial. Dilutional, hematuria, critical illness   BPH (benign prostatic hyperplasia)   lactic acidosis  Hypomagnesemia: Replete  Critical care time 45 minutes. Discussed with the patient's wife.   LOS: 2 days   Jemiah Cuadra L 09/24/2011, 9:19 AM

## 2011-09-24 NOTE — Progress Notes (Signed)
MRN # [id number] NWG#[id number]

## 2011-09-24 NOTE — Progress Notes (Signed)
eLink Physician-Brief Progress Note Patient Name: Garrett Bradley DOB: 24-Aug-1934 MRN: [id number]  Date of Service  09/24/2011   HPI/Events of Note  24 h on sepssi protocol   eICU Interventions  Taper dobutamine to off Keep vaso on until levo down to minimal doses or off Add stress dose steroids- taper off rapidly once off pressors   Intervention Category Major Interventions: Sepsis - evaluation and management  Naasia Weilbacher V. 09/24/2011, 6:12 PM

## 2011-09-25 DIAGNOSIS — D696 Thrombocytopenia, unspecified: Secondary | ICD-10-CM | POA: Diagnosis present

## 2011-09-25 DIAGNOSIS — N390 Urinary tract infection, site not specified: Secondary | ICD-10-CM | POA: Diagnosis present

## 2011-09-25 LAB — BLOOD GAS, ARTERIAL
Bicarbonate: 21.6 mEq/L (ref 20.0–24.0)
Delivery systems: POSITIVE
FIO2: 0.5 %
TCO2: 20.3 mmol/L (ref 0–100)
pCO2 arterial: 48.5 mmHg — ABNORMAL HIGH (ref 35.0–45.0)
pCO2 arterial: 40 mmHg (ref 35.0–45.0)
pH, Arterial: 7.274 — ABNORMAL LOW (ref 7.350–7.450)
pH, Arterial: 7.352 (ref 7.350–7.450)

## 2011-09-25 LAB — BASIC METABOLIC PANEL
CO2: 24 mEq/L (ref 19–32)
Calcium: 7.4 mg/dL — ABNORMAL LOW (ref 8.4–10.5)
Glucose, Bld: 103 mg/dL — ABNORMAL HIGH (ref 70–99)
Sodium: 139 mEq/L (ref 135–145)

## 2011-09-25 LAB — GLUCOSE, CAPILLARY
Glucose-Capillary: 98 mg/dL (ref 70–99)
Glucose-Capillary: 106 mg/dL — ABNORMAL HIGH (ref 70–99)
Glucose-Capillary: 87 mg/dL (ref 70–99)
Glucose-Capillary: 76 mg/dL (ref 70–99)

## 2011-09-25 LAB — HEPATIC FUNCTION PANEL
ALT: 21 U/L (ref 0–53)
Alkaline Phosphatase: 75 U/L (ref 39–117)
Bilirubin, Direct: 0.5 mg/dL — ABNORMAL HIGH (ref 0.0–0.3)
Indirect Bilirubin: 0.1 mg/dL — ABNORMAL LOW (ref 0.3–0.9)
Total Bilirubin: 0.6 mg/dL (ref 0.3–1.2)

## 2011-09-25 LAB — CBC
Hemoglobin: 9.8 g/dL — ABNORMAL LOW (ref 13.0–17.0)
MCH: 29.5 pg (ref 26.0–34.0)
MCV: 86.1 fL (ref 78.0–100.0)
Platelets: 93 10*3/uL — ABNORMAL LOW (ref 150–400)
Platelets: 85 10*3/uL — ABNORMAL LOW (ref 150–400)
RBC: 3.43 MIL/uL — ABNORMAL LOW (ref 4.22–5.81)
RBC: 3.32 MIL/uL — ABNORMAL LOW (ref 4.22–5.81)
WBC: 27.6 10*3/uL — ABNORMAL HIGH (ref 4.0–10.5)

## 2011-09-25 LAB — URINE CULTURE

## 2011-09-25 MED ORDER — PHENYLEPHRINE HCL 10 MG/ML IJ SOLN
30.0000 ug/min | INTRAVENOUS | Status: DC
  Administered 2011-09-25: 10 ug/min via INTRAVENOUS
  Filled 2011-09-25: qty 1

## 2011-09-25 MED ORDER — PHENYLEPHRINE HCL 10 MG/ML IJ SOLN: 10.0000 ug | INTRAMUSCULAR | Status: DC

## 2011-09-25 MED ORDER — DEXTROSE 5 % IV SOLN
30.0000 ug/min | INTRAVENOUS | Status: AC
  Administered 2011-09-25: 40 ug/min via INTRAVENOUS
  Filled 2011-09-25: qty 2

## 2011-09-25 MED ORDER — PHENYLEPHRINE HCL 10 MG/ML IJ SOLN
INTRAMUSCULAR | Status: AC
  Filled 2011-09-25: qty 1

## 2011-09-25 MED ORDER — METOPROLOL TARTRATE 1 MG/ML IV SOLN
5.0000 mg | Freq: Once | INTRAVENOUS | Status: AC
  Administered 2011-09-25: 5 mg via INTRAVENOUS
  Filled 2011-09-25: qty 5

## 2011-09-25 MED ORDER — FUROSEMIDE 10 MG/ML IJ SOLN
20.0000 mg | Freq: Once | INTRAMUSCULAR | Status: AC
  Administered 2011-09-25: 20 mg via INTRAVENOUS
  Filled 2011-09-25: qty 2

## 2011-09-25 NOTE — Progress Notes (Signed)
Report#[id number]           ZOX#[id number]

## 2011-09-25 NOTE — Consult Note (Signed)
ANTIBIOTIC CONSULT NOTE   Pharmacy Consult for Primaxin Indication: in ICU with febrile illness, sepsis  No Known Allergies  Patient Measurements: Height: 6\' 4"  (193 cm) Weight: 296 lb 1.2 oz (134.3 kg) IBW/kg (Calculated) : 86.8   Vital Signs: Temp: 99.7 F (37.6 C) (10/19 0400) Temp src: Axillary (10/19 0400) BP: 98/65 mmHg (10/19 1100) Pulse Rate: 88  (10/19 1115) Intake/Output from previous day: 10/18 0701 - 10/19 0700 In: 3255.6 [P.O.:120; I.V.:2385.6; IV Piggyback:750] Out: 1462 [Urine:1462] Intake/Output from this shift:    Labs:  Basename 09/25/11 0532 09/25/11 0109 09/24/11 0457 09/23/11 0514  WBC 24.7* 27.6* 21.8* --  HGB 9.8* 9.9* 9.6* --  PLT 85* 93* 96* --  LABCREA -- -- -- --  CREATININE 3.81* -- 3.24* 1.61*   Estimated Creatinine Clearance: 25.1 ml/min (by C-G formula based on Cr of 3.81).  Microbiology: Recent Results (from the past 720 hour(s))  URINE CULTURE     Status: Normal   Collection Time   09/08/11  5:51 PM      Component Value Range Status Comment   Specimen Description URINE, CLEAN CATCH   Final    Special Requests NONE   Final    Setup Time [id number]   Final    Colony Count NO GROWTH   Final    Culture NO GROWTH   Final    Report Status 09/09/2011 FINAL   Final   SURGICAL PCR SCREEN     Status: Normal   Collection Time   09/21/11  2:02 PM      Component Value Range Status Comment   MRSA, PCR NEGATIVE  NEGATIVE  Final    Staphylococcus aureus NEGATIVE  NEGATIVE  Final   URINE CULTURE     Status: Normal   Collection Time   09/23/11 11:08 AM      Component Value Range Status Comment   Specimen Description URINE, CATHETERIZED   Final    Special Requests NONE   Final    Setup Time [id number]   Final    Colony Count >=100,000 COLONIES/ML   Final    Culture ESCHERICHIA COLI   Final    Report Status 09/25/2011 FINAL   Final    Organism ID, Bacteria ESCHERICHIA COLI   Final   CULTURE, BLOOD (ROUTINE X 2)     Status: Normal  (Preliminary result)   Collection Time   09/23/11 11:44 AM      Component Value Range Status Comment   Specimen Description BLOOD LEFT HAND 1ST SITE   Final    Special Requests BOTTLES DRAWN AEROBIC AND ANAEROBIC 4CC   Final    Culture     Final    Value: FILAMENTOUS GRAM NEGATIVE RODS     Gram Stain Report Called to,Read Back By and Verified With: HARRIS,C. AT 1540 ON 09/24/2011 BY BAUGHAM,M.     Performed at [facility]   Report Status PENDING   Incomplete   CULTURE, BLOOD (ROUTINE X 2)     Status: Normal (Preliminary result)   Collection Time   09/23/11 11:57 AM      Component Value Range Status Comment   Specimen Description BLOOD LEFT HAND 2ND SITE   Final    Special Requests BOTTLES DRAWN AEROBIC AND ANAEROBIC 4CC   Final    Culture NO GROWTH 1 DAY   Final    Report Status PENDING   Incomplete    Medical History: Past Medical History  Diagnosis Date  . Hypertension   .  Diabetes mellitus   . Hypothyroidism   . Depression    Medications:  Scheduled:     . antiseptic oral rinse   Mouth Rinse Q4H  . furosemide  20 mg Intravenous Once  . hydrocortisone sodium succinate  50 mg Intravenous Q6H  . imipenem-cilastatin  250 mg Intravenous Q6H  . insulin aspart  0-9 Units Subcutaneous Q4H  . insulin glargine  5 Units Subcutaneous QAM  . levothyroxine  26 mcg Intravenous Daily  . metoprolol  5 mg Intravenous Once  . sodium chloride  10 mL Intracatheter Q12H  . sodium chloride      . Travoprost (BAK Free)  1 drop Both Eyes QHS  . DISCONTD: sodium chloride  1,000 mL Intravenous Once   Assessment: SCr WORSENING Renal fxn worsening  Goal of Therapy:  Eradicate infection.  Plan: Continue Primaxin to 250mg  iv q6hrs   Mady Gemma 09/25/2011,11:58 AM

## 2011-09-25 NOTE — Progress Notes (Signed)
eLink Physician-Brief Progress Note Patient Name: Garrett Bradley DOB: 1934-05-23 MRN: [id number]  Date of Service  09/25/2011   HPI/Events of Note   hypotension  eICU Interventions  BP now in the 70s to 80s systolic but HR remains elevated at 123.  Had received lopressor earlier with some improvement in HR.  Concern for worsening tachcardia on NE gtt.  Plan:  Start Neo gtt to achieve MAP of 65.   Intervention Category Major Interventions: Sepsis - evaluation and management  DETERDING,ELIZABETH 09/25/2011, 4:33 AM

## 2011-09-25 NOTE — Progress Notes (Signed)
The patient's chart has been reviewed. Because of tachycardia and hypotension, [facility]. physician Dr. Darrick Penna started the patient on Neo. He remains on vasopressin for now. Also, BiPAP was started overnight do to hypoxia and respiratory acidosis. Also, IV hydrocortisone was started.  Subjective: The patient is lying in bed. His wife is sitting at the bedside. He has no complaints of chest pain, shortness of breath, or bladder pain.  Objective: Vital signs in last 24 hours: Filed Vitals:   09/25/11 0615 09/25/11 0630 09/25/11 0645 09/25/11 0700  BP: 86/56 91/58 103/67 81/59  Pulse: 93 95 98 94  Temp:      TempSrc:      Resp: 28 28 26  36  Height:      Weight:      SpO2: 100% 100% 100% 100%    CVP is 8  Weight change: 6 kg (13 lb 3.7 oz)  Intake/Output Summary (Last 24 hours) at 09/25/11 0754 Last data filed at 09/25/11 0700  Gross per 24 hour  Intake 3255.55 ml  Output   1462 ml  Net 1793.55 ml   General: Asleep, arousable. Answers questions appropriately Lungs: BiPAP is on. He is breathing comfortably. Clear anteriorly. Cardiovascular: S1, S2, with mild tachycardia. Abdomen bowel sounds present soft nontender nondistended GU: Foley catheter inserted. There is a small amount of serosanguineous urine in the Foley bag. Extremities warm diminished pulses no edema Neuro: The patient is sleeping, but arousable. He is oriented to himself, place, and wife. He squeezes the examiner's hands bilaterally and appropriately.  Lab Results: Basic Metabolic Panel:  Lab 09/25/11 1610 09/24/11 0457 09/24/11 0430  NA 139 136 --  K 4.7 3.5 --  CL 104 100 --  CO2 24 22 --  GLUCOSE 103* 196* --  BUN 43* 28* --  CREATININE 3.81* 3.24* --  CALCIUM 7.4* 7.2* --  MG -- -- 1.3*  PHOS -- -- 2.4   Liver Function Tests:  Lab 09/24/11 0934 09/23/11 1145  AST 39* 16  ALT 25 12  ALKPHOS 61 141*  BILITOT 0.7 0.8  PROT 5.7* 6.6  ALBUMIN 2.1* 2.6*    Lab 09/23/11 1145  LIPASE 14  AMYLASE  --   No results found for this basename: AMMONIA:2 in the last 168 hours CBC:  Lab 09/25/11 0532 09/25/11 0109 09/24/11 0457  WBC 24.7* 27.6* --  NEUTROABS -- -- 20.6*  HGB 9.8* 9.9* --  HCT 28.6* 29.6* --  MCV 86.1 86.3 --  PLT 85* 93* --   Cardiac Enzymes:  Lab 09/23/11 1419  CKTOTAL 289*  CKMB 2.5  CKMBINDEX --  TROPONINI <0.30   BNP:  Lab 09/23/11 1358  POCBNP 882.4*   D-Dimer: No results found for this basename: DDIMER:2 in the last 168 hours CBG:  Lab 09/25/11 0428 09/24/11 2343 09/24/11 2017 09/24/11 1557 09/24/11 1144 09/24/11 0729  GLUCAP 98 135* 150* 161* 177* 185*   Hemoglobin A1C: No results found for this basename: HGBA1C in the last 168 hours Fasting Lipid Panel: No results found for this basename: CHOL,HDL,LDLCALC,TRIG,CHOLHDL,LDLDIRECT in the last 960 hours Thyroid Function Tests:  Lab 09/24/11 0430 09/23/11 1145  TSH -- 7.139*  T4TOTAL -- --  FREET4 1.04 --  T3FREE 1.4* --  THYROIDAB -- --   Anemia Panel: No results found for this basename: VITAMINB12,FOLATE,FERRITIN,TIBC,IRON,RETICCTPCT in the last 168 hours  Alcohol Level: No results found for this basename: ETH:2 in the last 168 hours Urinalysis: Specific gravity 1.010. PH 6.0. Negative nitrites. Small leukocyte esterase. 21-50  white blood cells. Too numerous to count red cells. Many bacteria.  ABG    Component Value Date/Time   PHART 7.352 09/25/2011 0548   HCO3 21.6 09/25/2011 0548   TCO2 20.3 09/25/2011 0548   ACIDBASEDEF 3.1* 09/25/2011 0548   O2SAT 99.1 09/25/2011 0548    Micro Results: Recent Results (from the past 240 hour(s))  SURGICAL PCR SCREEN     Status: Normal   Collection Time   09/21/11  2:02 PM      Component Value Range Status Comment   MRSA, PCR NEGATIVE  NEGATIVE  Final    Staphylococcus aureus NEGATIVE  NEGATIVE  Final   URINE CULTURE     Status: Normal (Preliminary result)   Collection Time   09/23/11 11:08 AM      Component Value Range Status Comment     Specimen Description URINE, CATHETERIZED   Final    Special Requests NONE   Final    Setup Time [id number]   Final    Colony Count >=100,000 COLONIES/ML   Final    Culture ESCHERICHIA COLI   Final    Report Status PENDING   Incomplete   CULTURE, BLOOD (ROUTINE X 2)     Status: Normal (Preliminary result)   Collection Time   09/23/11 11:44 AM      Component Value Range Status Comment   Specimen Description BLOOD LEFT HAND 1ST SITE   Final    Special Requests BOTTLES DRAWN AEROBIC AND ANAEROBIC 4CC   Final    Culture     Final    Value: FILAMENTOUS GRAM NEGATIVE RODS     Gram Stain Report Called to,Read Back By and Verified With: HARRIS,C. AT 1540 ON 09/24/2011 BY BAUGHAM,M.     Performed at [facility]   Report Status PENDING   Incomplete   CULTURE, BLOOD (ROUTINE X 2)     Status: Normal (Preliminary result)   Collection Time   09/23/11 11:57 AM      Component Value Range Status Comment   Specimen Description BLOOD LEFT HAND 2ND SITE   Final    Special Requests BOTTLES DRAWN AEROBIC AND ANAEROBIC 4CC   Final    Culture NO GROWTH 1 DAY   Final    Report Status PENDING   Incomplete    Studies/Results: US Renal  09/24/2011  *RADIOLOGY REPORT*  Clinical Data: Acute renal failure  RENAL/URINARY TRACT ULTRASOUND COMPLETE  Comparison:  CT dated 09/16/2011  Findings:  Evaluation is constrained by body habitus and overlying bowel gas.  Horsehoe kidney.  Right Kidney:  Right renal moiety measures approximately 12.1 cm. Poorly visualized.  No gross hydronephrosis.  Left Kidney:  Left renal moiety measures approximately 12.4 cm. Poorly visualized.  No gross hydronephrosis.  Bladder:  Distended despite indwelling Foley catheter.  Calculated bladder volume 641 ml.  IMPRESSION: Horseshoe kidney.  No gross hydronephrosis.  Distended bladder despite indwelling Foley catheter.  Clinical assessment is suggested.  Original Report Authenticated By: Charline Bills, M.D.   Dg Chest Portable 1  View  09/24/2011  *RADIOLOGY REPORT*  Clinical Data: Tachypnea, dyspnea  PORTABLE CHEST - 1 VIEW  Comparison: 09/23/2011; 05/13/2009  Findings:  Examination is degraded secondary to patient motion artifact. Grossly unchanged enlarged cardiac silhouette and mediastinal contours.  Left subclavian approach central venous catheter tip is obscured by motion, seen at least to the level of the central aspect of the left brachiocephalic vein. Pulmonary vasculature remains distinct.  No change minimal increase in perihilar and bilateral  medial basilar heterogeneous opacities with at least partial atelectasis of the right lower lobe. No definite pleural effusion or pneumothorax.  Grossly unchanged bones.  IMPRESSION: 1.  Examination degraded by patient motion artifact. 2.  No change to minimal increase in perihilar and bilateral medial basilar heterogeneous opacities with at least partial atelectasis of the right lower lobe. 3.  Query mild pulmonary edema.  Original Report Authenticated By: Waynard Reeds, M.D.   Dg Chest Portable 1 View  09/23/2011  *RADIOLOGY REPORT*  Clinical Data: Sepsis, evaluate for pneumonia  PORTABLE CHEST - 1 VIEW  Comparison: Portable chest x-ray of 05/13/2009  Findings: There is mild basilar atelectasis present.  Cardiomegaly is stable.  No focal infiltrate or effusion is seen.  IMPRESSION: Stable cardiomegaly.  Mild basilar atelectasis.  Original Report Authenticated By: Juline Patch, M.D.   Dg Chest Port 1v Same Day  09/23/2011  *RADIOLOGY REPORT*  Clinical Data: Left central line placement.  PORTABLE CHEST - 1 VIEW SAME DAY  Comparison: Chest radiograph 09/23/2011  Findings: Cardiac leads project over the chest.  A left subclavian central venous catheter is in place.  The distal tip is in the left brachiocephalic vein.  Cardiomegaly is stable.  Lung volumes are low.  Mild bibasilar atelectasis is similar to recent prior examination.  No pneumothorax, pleural effusion or airspace  disease is identified.  IMPRESSION: Left subclavian central venous catheter terminates in the left brachiocephalic vein.  No complicating feature identified.  Cardiomegaly and bibasilar atelectasis.  Original Report Authenticated By: Britta Mccreedy, M.D.   Scheduled Meds:    . antiseptic oral rinse   Mouth Rinse Q4H  . furosemide  20 mg Intravenous Once  . hydrocortisone sodium succinate  50 mg Intravenous Q6H  . imipenem-cilastatin  250 mg Intravenous Q6H  . insulin aspart  0-9 Units Subcutaneous Q4H  . insulin glargine  5 Units Subcutaneous QAM  . levothyroxine  26 mcg Intravenous Daily  . magnesium sulfate  2 g Intravenous Once  . metoprolol  5 mg Intravenous Once  . sodium chloride  10 mL Intracatheter Q12H  . sodium chloride      . Travoprost (BAK Free)  1 drop Both Eyes QHS  . DISCONTD: imipenem-cilastatin  500 mg Intravenous Q8H  . DISCONTD: insulin aspart  0-9 Units Subcutaneous TID WC  . DISCONTD: magnesium sulfate infusion  2 g Intravenous Once  . DISCONTD: sodium chloride  1,000 mL Intravenous Once  . DISCONTD: sodium chloride      . DISCONTD: vancomycin  1,000 mg Intravenous Q12H   Continuous Infusions:    . sodium chloride 50 mL/hr at 09/25/11 0700  . norepinephrine (LEVOPHED) Adult infusion Stopped (09/25/11 0100)  . phenylephrine (NEO-SYNEPHRINE) infusion    . vasopressin (PITRESSIN) infusion - *FOR SHOCK* Stopped (09/24/11 2300)  . DISCONTD: DOButamine Stopped (09/24/11 2200)  . DISCONTD: phenylephrine (NEO-SYNEPHRINE) infusion 40 mcg/min (09/25/11 0700)  . DISCONTD: phenylephrine     PRN Meds:.acetaminophen, acetaminophen, ALPRAZolam, ondansetron (ZOFRAN) IV, sodium chloride Assessment/Plan: Principal Problem:  *Septic shock secondary to Escherichia coli UTI, status post recent instrumentation: Continue Primaxin. Vancomycin and Cipro were discontinued.. Followup culture. Dopamine has been discontinued. He is now on vasopressin and Neo. Stress dosing of steroids  were added by one of the E link doctors. His blood pressure is improving and his tachycardia is resolving.   ARF (acute renal failure) secondary to sepsis/ATN/prerenal most likely. The renal ultrasound shows no hydronephrosis the Urine output has been adequate.   Toxic encephalopathy:  Resolving. We'll start a clear liquid diet and advance as tolerated.   S/P TURP: Per Dr. Jerre Simon.   Hypoxic respiratory failure with respiratory acidosis. BiPAP was started overnight. This has proven to be a good choice. His ABG reveals resolution of respiratory acidosis. His hypoxemia is resolving. He is clearly less tachypnea. His chest x-ray yesterday revealed minimal increase in perihilar and bilateral medial opacities with at least partial atelectasis of the right lower lobe and query mild pulmonary edema. He did receive 20 mg of IV Lasix once.   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   Hypothyroidism: TSH is 7. Patient had been on 25 mcg of levothyroxine daily. Will give 25 mcg IV until able to take oral medications. Will need a higher dose of levothyroxine once taking by mouth. We'll check a free T4.   CKD (chronic kidney disease), stage IV   Nausea & vomiting: Resolved   Benign hypertension   Anemia: Multifactorial. Dilutional, hematuria, critical illness.  Thrombocytopenia, likely secondary to sepsis. This will be followed.   BPH (benign prostatic hyperplasia)   lactic acidosis  Hypomagnesemia: Repleted. We'll check another magnesium level in the morning.  Critical care time 45 minutes. Discussed with the patient's wife.   LOS: 3 days   Jerry Clyne 09/25/2011, 7:54 AM

## 2011-09-25 NOTE — Progress Notes (Signed)
eLink Physician-Brief Progress Note Patient Name: Garrett Bradley DOB: 1934/01/13 MRN: [id number]  Date of Service  09/25/2011   HPI/Events of Note   Tachycardia and hypoxia  eICU Interventions  Patient with ongoing tachycardia and hypoxia with sats in the high 80s.  Now off pressor support with BP in the 90s systolic.  Patient normally on cardizem as home med.  Plan:  One time dose of lopressor to see if this will help with HR control.  Will need to monitor BP.  Ventimask to achieve O2 sats of greater than 90%.   Intervention Category Major Interventions: Sepsis - evaluation and management  DETERDING,ELIZABETH 09/25/2011, 2:07 AM

## 2011-09-25 NOTE — Progress Notes (Signed)
eLink Physician-Brief Progress Note Patient Name: Garrett Bradley DOB: [birth year] MRN: [id number]  Date of Service  09/25/2011   HPI/Events of Note   Tachycardia on pressor support s/p TURP  eICU Interventions  Call from nurse stating that patient continues to be tachycardic despite reduction in pressor support.  Current HR of 135 with BP of 144/68.  Remains on 6 mcg of NE.  H/O of being on cardizem for HTN.  Also with reduced UOP with no urine in bladder via scan.  CVP of 8.   Plan:  Continue to titrate NE to off as long as MAP in the 60s.  Monitor UOP.  Recommended calling urology to inform of reduction in UOP.   Intervention Category Major Interventions: Sepsis - evaluation and management  Kaysee Hergert 09/25/2011, 12:07 AM

## 2011-09-25 NOTE — Progress Notes (Signed)
eLink Physician-Brief Progress Note Patient Name: Garrett Bradley DOB: 1934-05-31 MRN: [id number]  Date of Service  09/25/2011   HPI/Events of Note   Hypoxia and respiratory acidosis  eICU Interventions  Patient admitted to ICU with sepsis syndrome.  Has been on pressors but now off.  Intermittent drop in O2 sats to the mid to high 80s.  Changed to venti mask with some improvement but ABG shows respiratory acidosis with pH of 7.27/48/66/21.  Patient is following commands.  Will try BiPAP to see if we can improve oxygenation and acid/base.   Intervention Category Major Interventions: Sepsis - evaluation and management  DETERDING,ELIZABETH 09/25/2011, 3:32 AM

## 2011-09-26 DIAGNOSIS — D689 Coagulation defect, unspecified: Secondary | ICD-10-CM | POA: Diagnosis present

## 2011-09-26 DIAGNOSIS — R6521 Severe sepsis with septic shock: Secondary | ICD-10-CM | POA: Diagnosis not present

## 2011-09-26 DIAGNOSIS — A419 Sepsis, unspecified organism: Secondary | ICD-10-CM | POA: Diagnosis not present

## 2011-09-26 DIAGNOSIS — E876 Hypokalemia: Secondary | ICD-10-CM | POA: Diagnosis not present

## 2011-09-26 LAB — BASIC METABOLIC PANEL
BUN: 57 mg/dL — ABNORMAL HIGH (ref 6–23)
Calcium: 7.5 mg/dL — ABNORMAL LOW (ref 8.4–10.5)
Chloride: 107 mEq/L (ref 96–112)
Creatinine, Ser: 2.8 mg/dL — ABNORMAL HIGH (ref 0.50–1.35)
GFR calc Af Amer: 24 mL/min — ABNORMAL LOW (ref >90)
GFR calc non Af Amer: 21 mL/min — ABNORMAL LOW (ref >90)

## 2011-09-26 LAB — GLUCOSE, CAPILLARY
Glucose-Capillary: 81 mg/dL (ref 70–99)
Glucose-Capillary: 88 mg/dL (ref 70–99)
Glucose-Capillary: 126 mg/dL — ABNORMAL HIGH (ref 70–99)

## 2011-09-26 LAB — CBC
HCT: 29.2 % — ABNORMAL LOW (ref 39.0–52.0)
MCHC: 34.6 g/dL (ref 30.0–36.0)
Platelets: 88 10*3/uL — ABNORMAL LOW (ref 150–400)
RDW: 15.2 % (ref 11.5–15.5)
WBC: 36.2 10*3/uL — ABNORMAL HIGH (ref 4.0–10.5)

## 2011-09-26 MED ORDER — VANCOMYCIN HCL IN DEXTROSE 1-5 GM/200ML-% IV SOLN
1000.0000 mg | Freq: Once | INTRAVENOUS | Status: AC
  Administered 2011-09-26: 1000 mg via INTRAVENOUS
  Filled 2011-09-26: qty 200

## 2011-09-26 MED ORDER — LEVOTHYROXINE SODIUM 50 MCG PO TABS
50.0000 ug | ORAL_TABLET | Freq: Every day | ORAL | Status: DC
  Administered 2011-09-27 – 2011-10-02 (×6): 50 ug via ORAL
  Filled 2011-09-26: qty 1
  Filled 2011-09-26: qty 2
  Filled 2011-09-26: qty 1
  Filled 2011-09-26: qty 2
  Filled 2011-09-26: qty 1
  Filled 2011-09-26 (×2): qty 2

## 2011-09-26 MED ORDER — HYDROCORTISONE SOD SUCCINATE 100 MG IJ SOLR
25.0000 mg | Freq: Four times a day (QID) | INTRAMUSCULAR | Status: DC
  Administered 2011-09-26 – 2011-09-27 (×4): 25 mg via INTRAVENOUS
  Filled 2011-09-26 (×4): qty 2

## 2011-09-26 MED ORDER — POTASSIUM CHLORIDE CRYS ER 20 MEQ PO TBCR
20.0000 meq | EXTENDED_RELEASE_TABLET | Freq: Two times a day (BID) | ORAL | Status: AC
  Administered 2011-09-26 (×2): 20 meq via ORAL
  Filled 2011-09-26 (×3): qty 1

## 2011-09-26 MED ORDER — POTASSIUM CHLORIDE CRYS ER 20 MEQ PO TBCR: 20.0000 meq | EXTENDED_RELEASE_TABLET | Freq: Two times a day (BID) | ORAL | Status: DC

## 2011-09-26 MED ORDER — SODIUM CHLORIDE 0.9 % IV SOLN
1500.0000 mg | INTRAVENOUS | Status: DC
  Administered 2011-09-27: 1500 mg via INTRAVENOUS
  Filled 2011-09-26 (×2): qty 1500

## 2011-09-26 NOTE — Progress Notes (Signed)
NAME:  Garrett Bradley, Garrett Bradley NO.:  [id number]  MEDICAL RECORD NO.:  [id number]  LOCATION:  IC07                          FACILITY:  [facility]  PHYSICIAN:  Ky Barban, M.D.DATE OF BIRTH:  October 27, 1934  DATE OF PROCEDURE: DATE OF DISCHARGE:                                PROGRESS NOTE   Mr. [name] is a 77 year old gentleman underwent TUR prostate for BPH last week.  The day he was ready to be discharged home, he went into septic shock, so the patient was transferred to ICU and he has the real bad sepsis, he has positive urine culture, blood culture with E. coli, and right now he is getting Primaxin IV antibiotics.  Today the 1st time I saw, the patient told me that he is feeling better.  His white count is still going up.  It has gone up to 27.6, hematocrit is stable although it came down gradually to 28.6.  He is not on any medication. Blood pressure is running 98/65.  Heart rate has slowed down, now it is 88.  His blood gases have improved.  His arterial pH is 7.35, PCO2 is 40, arterial PO2 is 175.  His temperature is 100.9.  Urine output seems to be adequate.  Yesterday, we noticed that his bladder was very much distended and in spite of the reason he had a 22 three-way Foley catheter which was draining fine.  But when we did a renal ultrasound, it showed the bladder was markedly distended, so the bladder was irrigated, 400 mL of fluid was removed.  Then I have to change the catheter to 20 regular Foley catheter which is draining fine.  Urine output seems to be adequate.  He has no hydronephrosis.  I have reviewed the notes from the hospitalist Dr. Elliot Cousin this morning and also from ICU doctor in [place] and he was still running low oxygen, so with the BiPAP the oxygen went up to 100% than it is now, changed in the last few hours to nasal cannula.  The oxygen still doing fairly well around 96.  I have spoken to his wife and the patient.  I reassured  them that he is doing better but still not completely out of this seriousness.     Ky Barban, M.D.     MIJ/MEDQ  D:  09/25/2011  T:  09/25/2011  Job:  [id number]

## 2011-09-26 NOTE — Consult Note (Signed)
ANTIBIOTIC CONSULT NOTE   Pharmacy Consult for Primaxin and Vancomycin (Restarted 09/26/2011). Indication: sepsis  No Known Allergies  Patient Measurements: Height: 6\' 4"  (193 cm) Weight: 294 lb 12.1 oz (133.7 kg) IBW/kg (Calculated) : 86.8   Vital Signs: Temp: 99.8 F (37.7 C) (10/20 0400) Temp src: Oral (10/20 0400) BP: 138/84 mmHg (10/20 0600) Pulse Rate: 96  (10/20 0600) Intake/Output from previous day: 10/19 0701 - 10/20 0700 In: 1751.7 [I.V.:1351.7; IV Piggyback:400] Out: 1654 [Urine:1650; Stool:4] Intake/Output from this shift:    Labs:  Basename 09/26/11 0457 09/25/11 0532 09/25/11 0109 09/24/11 0457  WBC 36.2* 24.7* 27.6* --  HGB 10.1* 9.8* 9.9* --  PLT 88* 85* 93* --  LABCREA -- -- -- --  CREATININE 2.80* 3.81* -- 3.24*   Estimated Creatinine Clearance: 34 ml/min (by C-G formula based on Cr of 2.8).  Microbiology: Recent Results (from the past 720 hour(s))  URINE CULTURE     Status: Normal   Collection Time   09/08/11  5:51 PM      Component Value Range Status Comment   Specimen Description URINE, CLEAN CATCH   Final    Special Requests NONE   Final    Setup Time [id number]   Final    Colony Count NO GROWTH   Final    Culture NO GROWTH   Final    Report Status 09/09/2011 FINAL   Final   SURGICAL PCR SCREEN     Status: Normal   Collection Time   09/21/11  2:02 PM      Component Value Range Status Comment   MRSA, PCR NEGATIVE  NEGATIVE  Final    Staphylococcus aureus NEGATIVE  NEGATIVE  Final   URINE CULTURE     Status: Normal   Collection Time   09/23/11 11:08 AM      Component Value Range Status Comment   Specimen Description URINE, CATHETERIZED   Final    Special Requests NONE   Final    Setup Time [id number]   Final    Colony Count >=100,000 COLONIES/ML   Final    Culture ESCHERICHIA COLI   Final    Report Status 09/25/2011 FINAL   Final    Organism ID, Bacteria ESCHERICHIA COLI   Final   CULTURE, BLOOD (ROUTINE X 2)     Status: Normal  (Preliminary result)   Collection Time   09/23/11 11:44 AM      Component Value Range Status Comment   Specimen Description BLOOD LEFT HAND 1ST SITE   Final    Special Requests BOTTLES DRAWN AEROBIC AND ANAEROBIC 4CC   Final    Culture     Final    Value: FILAMENTOUS GRAM NEGATIVE RODS     Gram Stain Report Called to,Read Back By and Verified With: HARRIS,C. AT 1540 ON 09/24/2011 BY BAUGHAM,M.     Performed at [facility]   Report Status PENDING   Incomplete   CULTURE, BLOOD (ROUTINE X 2)     Status: Normal (Preliminary result)   Collection Time   09/23/11 11:57 AM      Component Value Range Status Comment   Specimen Description BLOOD LEFT HAND 2ND SITE   Final    Special Requests BOTTLES DRAWN AEROBIC AND ANAEROBIC 4CC   Final    Culture NO GROWTH 3 DAYS   Final    Report Status PENDING   Incomplete    Medical History: Past Medical History  Diagnosis Date  . Hypertension   .  Diabetes mellitus   . Hypothyroidism   . Depression    Medications:  Scheduled:     . antiseptic oral rinse   Mouth Rinse Q4H  . hydrocortisone sodium succinate  25 mg Intravenous Q6H  . imipenem-cilastatin  250 mg Intravenous Q6H  . insulin aspart  0-9 Units Subcutaneous Q4H  . insulin glargine  5 Units Subcutaneous QAM  . levothyroxine  50 mcg Oral QAC breakfast  . potassium chloride  20 mEq Oral BID  . sodium chloride  10 mL Intracatheter Q12H  . Travoprost (BAK Free)  1 drop Both Eyes QHS  . vancomycin  1,000 mg Intravenous Once  . DISCONTD: hydrocortisone sodium succinate  50 mg Intravenous Q6H  . DISCONTD: levothyroxine  26 mcg Intravenous Daily   Assessment: SCr WORSENING Renal fxn worsening  Goal of Therapy:  Eradicate infection. Vancomycin trough 15-20.  Plan: Continue Primaxin to 250mg  iv q6hrs Vancomycin 1000mg  IV x 1 today.  Start 1500mg  IV every 24 hours tomorrow unless renal function worsens.  Lamonte Richer R 09/26/2011,8:33 AM

## 2011-09-26 NOTE — Progress Notes (Signed)
Subjective: Patient is sitting up in bed. He is being his wife. He has no complaints of pain or shortness of breath. His wife reports diarrhea, however, PRE and states that he 2-3 soft stools that were not loose or watery.  Objective: Vital signs in last 24 hours: Filed Vitals:   09/26/11 0300 09/26/11 0400 09/26/11 0500 09/26/11 0600  BP: 143/78 138/91 134/82 138/84  Pulse: 77 96 98 96  Temp:  99.8 F (37.7 C)    TempSrc:  Oral    Resp: 30 28 28 27   Height:      Weight:   133.7 kg (294 lb 12.1 oz)   SpO2: 89% 92% 90% 97%    CVP is 8  Weight change: 1.7 kg (3 lb 12 oz)  Intake/Output Summary (Last 24 hours) at 09/26/11 0826 Last data filed at 09/26/11 0600  Gross per 24 hour  Intake 1541.73 ml  Output   1654 ml  Net -112.27 ml   General: NAD. Lungs:Clear anteriorly. Breathing comfortably. Cardiovascular: S1, S2, with mild tachycardia. Abdomen bowel sounds present soft nontender nondistended GU: Foley catheter inserted. Urine is now clear.. Extremities: warm diminished pulses, no edema Neuro: Alert, oriented to self, wife, daughter. Not oriented to place.  Lab Results: Basic Metabolic Panel:  Lab 09/26/11 1610 09/25/11 0532 09/24/11 0430  NA 143 139 --  K 3.3* 4.7 --  CL 107 104 --  CO2 24 24 --  GLUCOSE 75 103* --  BUN 57* 43* --  CREATININE 2.80* 3.81* --  CALCIUM 7.5* 7.4* --  MG 2.1 -- 1.3*  PHOS -- -- 2.4   Liver Function Tests:  Lab 09/25/11 0532 09/24/11 0934  AST 40* 39*  ALT 21 25  ALKPHOS 75 61  BILITOT 0.6 0.7  PROT 6.1 5.7*  ALBUMIN 2.1* 2.1*    Lab 09/23/11 1145  LIPASE 14  AMYLASE --   No results found for this basename: AMMONIA:2 in the last 168 hours CBC:  Lab 09/26/11 0457 09/25/11 0532 09/24/11 0457  WBC 36.2* 24.7* --  NEUTROABS -- -- 20.6*  HGB 10.1* 9.8* --  HCT 29.2* 28.6* --  MCV 84.1 86.1 --  PLT 88* 85* --   Cardiac Enzymes:  Lab 09/23/11 1419  CKTOTAL 289*  CKMB 2.5  CKMBINDEX --  TROPONINI <0.30    BNP:  Lab 09/23/11 1358  POCBNP 882.4*   D-Dimer: No results found for this basename: DDIMER:2 in the last 168 hours CBG:  Lab 09/26/11 0740 09/26/11 0423 09/26/11 0008 09/25/11 1954 09/25/11 1629 09/25/11 1212  GLUCAP 88 81 70 73 76 87   Hemoglobin A1C: No results found for this basename: HGBA1C in the last 168 hours Fasting Lipid Panel: No results found for this basename: CHOL,HDL,LDLCALC,TRIG,CHOLHDL,LDLDIRECT in the last 960 hours Thyroid Function Tests:  Lab 09/25/11 0532 09/24/11 0430 09/23/11 1145  TSH -- -- 7.139*  T4TOTAL -- -- --  FREET4 1.04 -- --  T3FREE -- 1.4* --  THYROIDAB -- -- --   Anemia Panel: No results found for this basename: VITAMINB12,FOLATE,FERRITIN,TIBC,IRON,RETICCTPCT in the last 168 hours  Alcohol Level: No results found for this basename: ETH:2 in the last 168 hours Urinalysis: Specific gravity 1.010. PH 6.0. Negative nitrites. Small leukocyte esterase. 21-50 white blood cells. Too numerous to count red cells. Many bacteria.  ABG    Component Value Date/Time   PHART 7.352 09/25/2011 0548   HCO3 21.6 09/25/2011 0548   TCO2 20.3 09/25/2011 0548   ACIDBASEDEF 3.1* 09/25/2011 0548  O2SAT 99.1 09/25/2011 0548    Micro Results: Recent Results (from the past 240 hour(s))  SURGICAL PCR SCREEN     Status: Normal   Collection Time   09/21/11  2:02 PM      Component Value Range Status Comment   MRSA, PCR NEGATIVE  NEGATIVE  Final    Staphylococcus aureus NEGATIVE  NEGATIVE  Final   URINE CULTURE     Status: Normal   Collection Time   09/23/11 11:08 AM      Component Value Range Status Comment   Specimen Description URINE, CATHETERIZED   Final    Special Requests NONE   Final    Setup Time [id number]   Final    Colony Count >=100,000 COLONIES/ML   Final    Culture ESCHERICHIA COLI   Final    Report Status 09/25/2011 FINAL   Final    Organism ID, Bacteria ESCHERICHIA COLI   Final   CULTURE, BLOOD (ROUTINE X 2)     Status: Normal  (Preliminary result)   Collection Time   09/23/11 11:44 AM      Component Value Range Status Comment   Specimen Description BLOOD LEFT HAND 1ST SITE   Final    Special Requests BOTTLES DRAWN AEROBIC AND ANAEROBIC 4CC   Final    Culture     Final    Value: FILAMENTOUS GRAM NEGATIVE RODS     Gram Stain Report Called to,Read Back By and Verified With: HARRIS,C. AT 1540 ON 09/24/2011 BY BAUGHAM,M.     Performed at [facility]   Report Status PENDING   Incomplete   CULTURE, BLOOD (ROUTINE X 2)     Status: Normal (Preliminary result)   Collection Time   09/23/11 11:57 AM      Component Value Range Status Comment   Specimen Description BLOOD LEFT HAND 2ND SITE   Final    Special Requests BOTTLES DRAWN AEROBIC AND ANAEROBIC 4CC   Final    Culture NO GROWTH 3 DAYS   Final    Report Status PENDING   Incomplete    Studies/Results: US Renal  09/24/2011  *RADIOLOGY REPORT*  Clinical Data: Acute renal failure  RENAL/URINARY TRACT ULTRASOUND COMPLETE  Comparison:  CT dated 09/16/2011  Findings:  Evaluation is constrained by body habitus and overlying bowel gas.  Horsehoe kidney.  Right Kidney:  Right renal moiety measures approximately 12.1 cm. Poorly visualized.  No gross hydronephrosis.  Left Kidney:  Left renal moiety measures approximately 12.4 cm. Poorly visualized.  No gross hydronephrosis.  Bladder:  Distended despite indwelling Foley catheter.  Calculated bladder volume 641 ml.  IMPRESSION: Horseshoe kidney.  No gross hydronephrosis.  Distended bladder despite indwelling Foley catheter.  Clinical assessment is suggested.  Original Report Authenticated By: Charline Bills, M.D.   Dg Chest Portable 1 View  09/24/2011  *RADIOLOGY REPORT*  Clinical Data: Tachypnea, dyspnea  PORTABLE CHEST - 1 VIEW  Comparison: 09/23/2011; 05/13/2009  Findings:  Examination is degraded secondary to patient motion artifact. Grossly unchanged enlarged cardiac silhouette and mediastinal contours.  Left  subclavian approach central venous catheter tip is obscured by motion, seen at least to the level of the central aspect of the left brachiocephalic vein. Pulmonary vasculature remains distinct.  No change minimal increase in perihilar and bilateral medial basilar heterogeneous opacities with at least partial atelectasis of the right lower lobe. No definite pleural effusion or pneumothorax.  Grossly unchanged bones.  IMPRESSION: 1.  Examination degraded by patient motion artifact. 2.  No change to minimal increase in perihilar and bilateral medial basilar heterogeneous opacities with at least partial atelectasis of the right lower lobe. 3.  Query mild pulmonary edema.  Original Report Authenticated By: Waynard Reeds, M.D.   Scheduled Meds:    . antiseptic oral rinse   Mouth Rinse Q4H  . hydrocortisone sodium succinate  25 mg Intravenous Q6H  . imipenem-cilastatin  250 mg Intravenous Q6H  . insulin aspart  0-9 Units Subcutaneous Q4H  . insulin glargine  5 Units Subcutaneous QAM  . levothyroxine  50 mcg Oral QAC breakfast  . potassium chloride  20 mEq Oral BID  . sodium chloride  10 mL Intracatheter Q12H  . Travoprost (BAK Free)  1 drop Both Eyes QHS  . vancomycin  1,000 mg Intravenous Once  . DISCONTD: hydrocortisone sodium succinate  50 mg Intravenous Q6H  . DISCONTD: levothyroxine  26 mcg Intravenous Daily   Continuous Infusions:    . sodium chloride 50 mL (09/26/11 0803)  . norepinephrine (LEVOPHED) Adult infusion Stopped (09/25/11 0100)  . phenylephrine (NEO-SYNEPHRINE) infusion Stopped (09/25/11 1750)  . vasopressin (PITRESSIN) infusion - *FOR SHOCK* Stopped (09/24/11 2300)   PRN Meds:.acetaminophen, acetaminophen, ALPRAZolam, ondansetron (ZOFRAN) IV, sodium chloride Assessment/Plan: Principal Problem:  *Septic shock secondary to Escherichia coli UTI, GNR bacteremia, status post recent instrumentation: Continue Primaxin. Vancomycin and Cipro were discontinued, but will restart Vanco   with recent increase in WBC. Blood pressure much improved; pressors have been discontinued. We'll decrease the dose of IV hydrocortisone.  Leukocytosis. This may be seconadary to IV steroids, however, will restart vancomycin just in case.   ARF on Stage 4 CKD. (acute renal failure) secondary to sepsis/ATN/prerenal most likely. The renal ultrasound shows no hydronephrosis the Urine output has been adequate. BUN and creatinine are improved.    Toxic encephalopathy:   S/P TURP: Per Dr. Jerre Simon.   Hypoxic respiratory failure with respiratory acidosis. Resolved. S/p BiPAP and IV lasix.    DM type 2. Controlled. Will change glucose monitoring to QAC and QHS.  Clear liquids started. Advance diet as tolerated.    Hypothyroidism: TSH is 7. Free T4 is WNL at 1.04. Will maintain Synthroid at 50 mcg daily in setting of critical illness. Change to po today.   Nausea & vomiting: Resolved  Hx HTN   Anemia: Multifactorial. Dilutional, hematuria, critical illness.  Thrombocytopenia, likely secondary to sepsis. This will be followed.  Coagulopathy. Secondary to sepsis.   BPH (benign prostatic hyperplasia)   Hypokalemia. Will supplement.   Hypomagnesemia: Repleted. Follow magnesium is WNL.  Critical care time 45 minutes. Discussed with the patient's wife.   LOS: 4 days   Garrett Bradley 09/26/2011, 8:26 AM

## 2011-09-27 LAB — DIFFERENTIAL
Basophils Absolute: 0.4 10*3/uL — ABNORMAL HIGH (ref 0.0–0.1)
Basophils Relative: 1 % (ref 0–1)
Eosinophils Absolute: 0 10*3/uL (ref 0.0–0.7)
Lymphocytes Relative: 6 % — ABNORMAL LOW (ref 12–46)
Monocytes Absolute: 1.1 10*3/uL — ABNORMAL HIGH (ref 0.1–1.0)
Neutrophils Relative %: 90 % — ABNORMAL HIGH (ref 43–77)

## 2011-09-27 LAB — CBC
Hemoglobin: 10.6 g/dL — ABNORMAL LOW (ref 13.0–17.0)
MCH: 28.4 pg (ref 26.0–34.0)
MCHC: 34.3 g/dL (ref 30.0–36.0)
Platelets: 82 10*3/uL — ABNORMAL LOW (ref 150–400)
RDW: 14.8 % (ref 11.5–15.5)

## 2011-09-27 LAB — PROTIME-INR
INR: 1.33 (ref 0.00–1.49)
Prothrombin Time: 16.7 seconds — ABNORMAL HIGH (ref 11.6–15.2)

## 2011-09-27 LAB — GLUCOSE, CAPILLARY
Glucose-Capillary: 151 mg/dL — ABNORMAL HIGH (ref 70–99)
Glucose-Capillary: 160 mg/dL — ABNORMAL HIGH (ref 70–99)
Glucose-Capillary: 202 mg/dL — ABNORMAL HIGH (ref 70–99)

## 2011-09-27 LAB — CULTURE, BLOOD (ROUTINE X 2)

## 2011-09-27 LAB — BASIC METABOLIC PANEL
BUN: 52 mg/dL — ABNORMAL HIGH (ref 6–23)
Calcium: 8.2 mg/dL — ABNORMAL LOW (ref 8.4–10.5)
GFR calc Af Amer: 44 mL/min — ABNORMAL LOW (ref >90)
GFR calc non Af Amer: 38 mL/min — ABNORMAL LOW (ref >90)
Glucose, Bld: 156 mg/dL — ABNORMAL HIGH (ref 70–99)
Potassium: 2.7 mEq/L — CL (ref 3.5–5.1)

## 2011-09-27 MED ORDER — DILTIAZEM HCL ER COATED BEADS 180 MG PO CP24: 300.0000 mg | ORAL_CAPSULE | Freq: Every day | ORAL | Status: DC

## 2011-09-27 MED ORDER — POTASSIUM CHLORIDE 20 MEQ PO PACK
40.0000 meq | PACK | Freq: Two times a day (BID) | ORAL | Status: DC
  Administered 2011-09-27 – 2011-09-29 (×5): 40 meq via ORAL
  Filled 2011-09-27 (×11): qty 2

## 2011-09-27 MED ORDER — SODIUM CHLORIDE 0.9 % IV SOLN: INTRAVENOUS | Status: DC

## 2011-09-27 MED ORDER — DILTIAZEM HCL ER COATED BEADS 180 MG PO CP24: 180.0000 mg | ORAL_CAPSULE | Freq: Every day | ORAL | Status: DC

## 2011-09-27 MED ORDER — SODIUM CHLORIDE 0.9 % IV SOLN
500.0000 mg | Freq: Three times a day (TID) | INTRAVENOUS | Status: DC
  Administered 2011-09-27 – 2011-09-30 (×9): 500 mg via INTRAVENOUS
  Filled 2011-09-27 (×13): qty 500

## 2011-09-27 MED ORDER — HYDROCORTISONE SOD SUCCINATE 100 MG IJ SOLR
25.0000 mg | Freq: Two times a day (BID) | INTRAMUSCULAR | Status: DC
  Administered 2011-09-27: 50 mg via INTRAVENOUS
  Administered 2011-09-28: 25 mg via INTRAVENOUS
  Filled 2011-09-27 (×3): qty 2

## 2011-09-27 MED ORDER — POTASSIUM CHLORIDE IN NACL 20-0.9 MEQ/L-% IV SOLN
INTRAVENOUS | Status: DC
  Administered 2011-09-27: 50 mL/h via INTRAVENOUS
  Administered 2011-09-27: 23:00:00 via INTRAVENOUS

## 2011-09-27 NOTE — Consult Note (Signed)
ANTIBIOTIC CONSULT NOTE   Pharmacy Consult for Primaxin and Vancomycin (Restarted 09/26/2011). Indication: sepsis  No Known Allergies  Patient Measurements: Height: 6\' 4"  (193 cm) Weight: 288 lb 9.3 oz (130.9 kg) IBW/kg (Calculated) : 86.8   Vital Signs: Temp: 99.5 F (37.5 C) (10/21 0400) Temp src: Oral (10/21 0400) BP: 150/82 mmHg (10/21 0800) Pulse Rate: 72  (10/21 0800) Intake/Output from previous day: 10/20 0701 - 10/21 0700 In: 1970 [P.O.:220; I.V.:1150; IV Piggyback:600] Out: 4800 [Urine:4800] Intake/Output from this shift: Total I/O In: 200 [I.V.:100; IV Piggyback:100] Out: -   Labs:  Basename 09/27/11 0508 09/26/11 0457 09/25/11 0532  WBC 37.1* 36.2* 24.7*  HGB 10.6* 10.1* 9.8*  PLT 82* 88* 85*  LABCREA -- -- --  CREATININE 1.69* 2.80* 3.81*   Estimated Creatinine Clearance: 55.8 ml/min (by C-G formula based on Cr of 1.69).  Microbiology: Recent Results (from the past 720 hour(s))  URINE CULTURE     Status: Normal   Collection Time   09/08/11  5:51 PM      Component Value Range Status Comment   Specimen Description URINE, CLEAN CATCH   Final    Special Requests NONE   Final    Setup Time [id number]   Final    Colony Count NO GROWTH   Final    Culture NO GROWTH   Final    Report Status 09/09/2011 FINAL   Final   SURGICAL PCR SCREEN     Status: Normal   Collection Time   09/21/11  2:02 PM      Component Value Range Status Comment   MRSA, PCR NEGATIVE  NEGATIVE  Final    Staphylococcus aureus NEGATIVE  NEGATIVE  Final   URINE CULTURE     Status: Normal   Collection Time   09/23/11 11:08 AM      Component Value Range Status Comment   Specimen Description URINE, CATHETERIZED   Final    Special Requests NONE   Final    Setup Time [id number]   Final    Colony Count >=100,000 COLONIES/ML   Final    Culture ESCHERICHIA COLI   Final    Report Status 09/25/2011 FINAL   Final    Organism ID, Bacteria ESCHERICHIA COLI   Final   CULTURE, BLOOD (ROUTINE  X 2)     Status: Normal (Preliminary result)   Collection Time   09/23/11 11:44 AM      Component Value Range Status Comment   Specimen Description BLOOD LEFT HAND 1ST SITE   Final    Special Requests BOTTLES DRAWN AEROBIC AND ANAEROBIC 4CC   Final    Setup Time [id number]   Final    Culture     Final    Value: GRAM NEGATIVE RODS     Note: Gram Stain Report Called to,Read Back By and Verified With: HARRIS C AT 1540 09/24/11 BY Garth Schlatter M Performed at [facility]   Report Status PENDING   Incomplete   CULTURE, BLOOD (ROUTINE X 2)     Status: Normal (Preliminary result)   Collection Time   09/23/11 11:57 AM      Component Value Range Status Comment   Specimen Description BLOOD LEFT HAND 2ND SITE   Final    Special Requests BOTTLES DRAWN AEROBIC AND ANAEROBIC 4CC   Final    Culture NO GROWTH 3 DAYS   Final    Report Status PENDING   Incomplete    Medical History: Past Medical History  Diagnosis  Date  . Hypertension   . Diabetes mellitus   . Hypothyroidism   . Depression    Medications:  Scheduled:     . antiseptic oral rinse   Mouth Rinse Q4H  . diltiazem  180 mg Oral Daily  . hydrocortisone sodium succinate  25 mg Intravenous Q12H  . imipenem-cilastatin  250 mg Intravenous Q6H  . insulin aspart  0-9 Units Subcutaneous Q4H  . insulin glargine  5 Units Subcutaneous QAM  . levothyroxine  50 mcg Oral QAC breakfast  . potassium chloride  40 mEq Oral BID  . potassium chloride  20 mEq Oral BID  . sodium chloride  10 mL Intracatheter Q12H  . Travoprost (BAK Free)  1 drop Both Eyes QHS  . vancomycin  1,500 mg Intravenous Q24H  . vancomycin  1,000 mg Intravenous Once  . DISCONTD: diltiazem  300 mg Oral Daily  . DISCONTD: hydrocortisone sodium succinate  25 mg Intravenous Q6H   Assessment: Renal fxn improving  Goal of Therapy:  Eradicate infection. Vancomycin trough 15-20.  Plan: Change Primaxin to 500mg  iv q8 hrs Vancomycin 1500mg  IV every 24 hours renal  function worsens.  Lamonte Richer R 09/27/2011,9:00 AM

## 2011-09-27 NOTE — Progress Notes (Signed)
Subjective: The patient is drowsy, but arousable. His wife is at his bedside. She reports that he seems to be doing much better. The registered nurse states that he is more alert and oriented. He ate a small amount of breakfast. He has no complaints currently.  Objective: Vital signs in last 24 hours: Filed Vitals:   09/27/11 0500 09/27/11 0600 09/27/11 0700 09/27/11 0800  BP: 141/80 128/57 129/76 150/82  Pulse: 78 62 68 72  Temp:      TempSrc:      Resp: 21 20 19 19   Height:      Weight:      SpO2: 97% 95% 94% 96%    CVP is 8  Weight change: -2.8 kg (-6 lb 2.8 oz)  Intake/Output Summary (Last 24 hours) at 09/27/11 0848 Last data filed at 09/27/11 0800  Gross per 24 hour  Intake   1970 ml  Output   4800 ml  Net  -2830 ml   General: NAD. Lungs:Clear anteriorly. Breathing comfortably. Cardiovascular: S1, S2, with systolic murmur. Abdomen bowel sounds present soft nontender nondistended GU: Foley catheter inserted. Urine is now clear.. Extremities: warm diminished pulses, no edema Neuro: Alert, oriented to self, wife, daughter, and now oriented to place.  Lab Results: Basic Metabolic Panel:  Lab 09/27/11 1610 09/26/11 0457 09/24/11 0430  NA 144 143 --  K 2.7* 3.3* --  CL 106 107 --  CO2 30 24 --  GLUCOSE 156* 75 --  BUN 52* 57* --  CREATININE 1.69* 2.80* --  CALCIUM 8.2* 7.5* --  MG -- 2.1 1.3*  PHOS -- -- 2.4   Liver Function Tests:  Lab 09/25/11 0532 09/24/11 0934  AST 40* 39*  ALT 21 25  ALKPHOS 75 61  BILITOT 0.6 0.7  PROT 6.1 5.7*  ALBUMIN 2.1* 2.1*    Lab 09/23/11 1145  LIPASE 14  AMYLASE --   No results found for this basename: AMMONIA:2 in the last 168 hours CBC:  Lab 09/27/11 0508 09/26/11 0457 09/24/11 0457  WBC 37.1* 36.2* --  NEUTROABS 33.4* -- 20.6*  HGB 10.6* 10.1* --  HCT 30.9* 29.2* --  MCV 82.8 84.1 --  PLT 82* 88* --   Cardiac Enzymes:  Lab 09/23/11 1419  CKTOTAL 289*  CKMB 2.5  CKMBINDEX --  TROPONINI <0.30    BNP:  Lab 09/23/11 1358  POCBNP 882.4*   D-Dimer: No results found for this basename: DDIMER:2 in the last 168 hours CBG:  Lab 09/27/11 0745 09/27/11 0517 09/27/11 0004 09/26/11 2002 09/26/11 1649 09/26/11 1129  GLUCAP 160* 151* 195* 175* 126* 115*   Hemoglobin A1C: No results found for this basename: HGBA1C in the last 168 hours Fasting Lipid Panel: No results found for this basename: CHOL,HDL,LDLCALC,TRIG,CHOLHDL,LDLDIRECT in the last 960 hours Thyroid Function Tests:  Lab 09/25/11 0532 09/24/11 0430 09/23/11 1145  TSH -- -- 7.139*  T4TOTAL -- -- --  FREET4 1.04 -- --  T3FREE -- 1.4* --  THYROIDAB -- -- --   Anemia Panel: No results found for this basename: VITAMINB12,FOLATE,FERRITIN,TIBC,IRON,RETICCTPCT in the last 168 hours  Alcohol Level: No results found for this basename: ETH:2 in the last 168 hours Urinalysis: Specific gravity 1.010. PH 6.0. Negative nitrites. Small leukocyte esterase. 21-50 white blood cells. Too numerous to count red cells. Many bacteria.  ABG    Component Value Date/Time   PHART 7.352 09/25/2011 0548   HCO3 21.6 09/25/2011 0548   TCO2 20.3 09/25/2011 0548   ACIDBASEDEF 3.1* 09/25/2011 0548  O2SAT 99.1 09/25/2011 0548    Micro Results: Recent Results (from the past 240 hour(s))  SURGICAL PCR SCREEN     Status: Normal   Collection Time   09/21/11  2:02 PM      Component Value Range Status Comment   MRSA, PCR NEGATIVE  NEGATIVE  Final    Staphylococcus aureus NEGATIVE  NEGATIVE  Final   URINE CULTURE     Status: Normal   Collection Time   09/23/11 11:08 AM      Component Value Range Status Comment   Specimen Description URINE, CATHETERIZED   Final    Special Requests NONE   Final    Setup Time [id number]   Final    Colony Count >=100,000 COLONIES/ML   Final    Culture ESCHERICHIA COLI   Final    Report Status 09/25/2011 FINAL   Final    Organism ID, Bacteria ESCHERICHIA COLI   Final   CULTURE, BLOOD (ROUTINE X 2)      Status: Normal (Preliminary result)   Collection Time   09/23/11 11:44 AM      Component Value Range Status Comment   Specimen Description BLOOD LEFT HAND 1ST SITE   Final    Special Requests BOTTLES DRAWN AEROBIC AND ANAEROBIC 4CC   Final    Setup Time [id number]   Final    Culture     Final    Value: GRAM NEGATIVE RODS     Note: Gram Stain Report Called to,Read Back By and Verified With: HARRIS C AT 1540 09/24/11 BY Garth Schlatter M Performed at [facility]   Report Status PENDING   Incomplete   CULTURE, BLOOD (ROUTINE X 2)     Status: Normal (Preliminary result)   Collection Time   09/23/11 11:57 AM      Component Value Range Status Comment   Specimen Description BLOOD LEFT HAND 2ND SITE   Final    Special Requests BOTTLES DRAWN AEROBIC AND ANAEROBIC 4CC   Final    Culture NO GROWTH 3 DAYS   Final    Report Status PENDING   Incomplete    Studies/Results: No results found. Scheduled Meds:    . antiseptic oral rinse   Mouth Rinse Q4H  . diltiazem  300 mg Oral Daily  . hydrocortisone sodium succinate  25 mg Intravenous Q12H  . imipenem-cilastatin  250 mg Intravenous Q6H  . insulin aspart  0-9 Units Subcutaneous Q4H  . insulin glargine  5 Units Subcutaneous QAM  . levothyroxine  50 mcg Oral QAC breakfast  . potassium chloride  40 mEq Oral BID  . potassium chloride  20 mEq Oral BID  . sodium chloride  10 mL Intracatheter Q12H  . Travoprost (BAK Free)  1 drop Both Eyes QHS  . vancomycin  1,500 mg Intravenous Q24H  . vancomycin  1,000 mg Intravenous Once  . DISCONTD: hydrocortisone sodium succinate  25 mg Intravenous Q6H   Continuous Infusions:    . sodium chloride 50 mL/hr at 09/27/11 0800  . norepinephrine (LEVOPHED) Adult infusion Stopped (09/25/11 0100)  . vasopressin (PITRESSIN) infusion - *FOR SHOCK* Stopped (09/24/11 2300)   PRN Meds:.acetaminophen, acetaminophen, ALPRAZolam, ondansetron (ZOFRAN) IV, sodium chloride Assessment/Plan: Principal Problem:  *Septic  shock secondary to Escherichia coli UTI, GNR bacteremia, status post recent instrumentation. He is significantly improved. Continue Primaxin. Vancomycin restarted because of the increasing white blood cell count and the central line.. Blood pressure much improved; pressors were discontinued. We'll decrease the dose of IV hydrocortisone.  Leukocytosis. This may be seconadary to IV steroids.   ARF on Stage 4 CKD. (acute renal failure) secondary to sepsis/ATN/prerenal most likely. The renal ultrasound shows no hydronephrosis. His urine output has been adequate. BUN and creatinine are improved.    Toxic encephalopathy: Resolving   S/P TURP: Per Dr. Jerre Simon.   Hypoxic respiratory failure with respiratory acidosis. Resolved. S/p BiPAP and IV lasix.    DM type 2. Controlled. Advance diet as tolerated.    Hypothyroidism: TSH is 7. Free T4 is WNL at 1.04. Will maintain Synthroid at 50 mcg daily in setting of critical illness. Changed to po.   Nausea & vomiting: Resolved    HTN.  His blood pressure has been trending up and therefore we will start Cardizem at a lower dose. We'll titrate to home dose accordingly.   Anemia: Multifactorial. Dilutional, hematuria, critical illness.  Thrombocytopenia, likely secondary to sepsis. This will be followed.  Coagulopathy. Secondary to sepsis; better.   BPH (benign prostatic hyperplasia)   Hypokalemia. Continue to supplement.   Hypomagnesemia: Repleted. Follow up magnesium is WNL.  Deconditioning. We'll order PT consult tomorrow.  Discussed with the patient's wife.   LOS: 5 days   Paidyn Mcferran 09/27/2011, 8:48 AM

## 2011-09-28 ENCOUNTER — Inpatient Hospital Stay (HOSPITAL_COMMUNITY): Payer: Medicare FFS

## 2011-09-28 ENCOUNTER — Encounter (HOSPITAL_COMMUNITY): Payer: Self-pay | Admitting: Urology

## 2011-09-28 DIAGNOSIS — R110 Nausea: Secondary | ICD-10-CM | POA: Diagnosis present

## 2011-09-28 DIAGNOSIS — E870 Hyperosmolality and hypernatremia: Secondary | ICD-10-CM | POA: Diagnosis not present

## 2011-09-28 LAB — GLUCOSE, CAPILLARY
Glucose-Capillary: 168 mg/dL — ABNORMAL HIGH (ref 70–99)
Glucose-Capillary: 191 mg/dL — ABNORMAL HIGH (ref 70–99)
Glucose-Capillary: 202 mg/dL — ABNORMAL HIGH (ref 70–99)

## 2011-09-28 LAB — BASIC METABOLIC PANEL
BUN: 42 mg/dL — ABNORMAL HIGH (ref 6–23)
CO2: 33 mEq/L — ABNORMAL HIGH (ref 19–32)
Chloride: 108 mEq/L (ref 96–112)
Creatinine, Ser: 1.31 mg/dL (ref 0.50–1.35)
GFR calc Af Amer: 60 mL/min — ABNORMAL LOW (ref >90)
Potassium: 2.9 mEq/L — ABNORMAL LOW (ref 3.5–5.1)

## 2011-09-28 LAB — MAGNESIUM: Magnesium: 2.3 mg/dL (ref 1.5–2.5)

## 2011-09-28 MED ORDER — HYDROCORTISONE SOD SUCCINATE 100 MG IJ SOLR: 25.0000 mg | Freq: Every day | INTRAMUSCULAR | Status: DC

## 2011-09-28 MED ORDER — SODIUM CHLORIDE 0.9 % IV SOLN
1500.0000 mg | Freq: Two times a day (BID) | INTRAVENOUS | Status: DC
  Administered 2011-09-28 (×2): 1500 mg via INTRAVENOUS
  Filled 2011-09-28 (×3): qty 1500

## 2011-09-28 MED ORDER — RAMIPRIL 10 MG PO CAPS
10.0000 mg | ORAL_CAPSULE | Freq: Two times a day (BID) | ORAL | Status: DC
  Administered 2011-09-28 – 2011-10-02 (×9): 10 mg via ORAL
  Filled 2011-09-28 (×9): qty 1

## 2011-09-28 MED ORDER — POTASSIUM CHLORIDE IN NACL 20-0.45 MEQ/L-% IV SOLN
INTRAVENOUS | Status: DC
  Administered 2011-09-28: 75 mL/h via INTRAVENOUS
  Administered 2011-09-29: 50 mL via INTRAVENOUS
  Filled 2011-09-28 (×3): qty 1000

## 2011-09-28 MED ORDER — POTASSIUM CHLORIDE CRYS ER 20 MEQ PO TBCR
40.0000 meq | EXTENDED_RELEASE_TABLET | Freq: Two times a day (BID) | ORAL | Status: AC
  Administered 2011-09-28 (×2): 40 meq via ORAL
  Filled 2011-09-28 (×2): qty 2

## 2011-09-28 NOTE — Progress Notes (Signed)
[phone]

## 2011-09-28 NOTE — Progress Notes (Signed)
Paged Dr. Sherrie Mustache that results back on abdominal xray series at 1018.

## 2011-09-28 NOTE — Consult Note (Signed)
ANTIBIOTIC CONSULT NOTE   Pharmacy Consult for Primaxin and Vancomycin (Restarted 09/26/2011). Indication: sepsis  No Known Allergies  Patient Measurements: Height: 6\' 4"  (193 cm) Weight: 286 lb 13.1 oz (130.1 kg) IBW/kg (Calculated) : 86.8   Vital Signs: Temp: 99.1 F (37.3 C) (10/22 0730) Temp src: Oral (10/22 0730) BP: 157/93 mmHg (10/22 0600) Pulse Rate: 60  (10/22 0600) Intake/Output from previous day: 10/21 0701 - 10/22 0700 In: 2622.7 [P.O.:640; I.V.:1076.7; IV Piggyback:906] Out: 4350 [Urine:4350] Intake/Output from this shift:    Labs:  Basename 09/28/11 0525 09/27/11 0508 09/26/11 0457  WBC -- 37.1* 36.2*  HGB -- 10.6* 10.1*  PLT -- 82* 88*  LABCREA -- -- --  CREATININE 1.31 1.69* 2.80*   Estimated Creatinine Clearance: 71.7 ml/min (by C-G formula based on Cr of 1.31).  Microbiology: Recent Results (from the past 720 hour(s))  URINE CULTURE     Status: Normal   Collection Time   09/08/11  5:51 PM      Component Value Range Status Comment   Specimen Description URINE, CLEAN CATCH   Final    Special Requests NONE   Final    Setup Time [id number]   Final    Colony Count NO GROWTH   Final    Culture NO GROWTH   Final    Report Status 09/09/2011 FINAL   Final   SURGICAL PCR SCREEN     Status: Normal   Collection Time   09/21/11  2:02 PM      Component Value Range Status Comment   MRSA, PCR NEGATIVE  NEGATIVE  Final    Staphylococcus aureus NEGATIVE  NEGATIVE  Final   URINE CULTURE     Status: Normal   Collection Time   09/23/11 11:08 AM      Component Value Range Status Comment   Specimen Description URINE, CATHETERIZED   Final    Special Requests NONE   Final    Setup Time [id number]   Final    Colony Count >=100,000 COLONIES/ML   Final    Culture ESCHERICHIA COLI   Final    Report Status 09/25/2011 FINAL   Final    Organism ID, Bacteria ESCHERICHIA COLI   Final   CULTURE, BLOOD (ROUTINE X 2)     Status: Normal   Collection Time   09/23/11  11:44 AM      Component Value Range Status Comment   Specimen Description BLOOD LEFT HAND 1ST SITE   Final    Special Requests BOTTLES DRAWN AEROBIC AND ANAEROBIC 4CC   Final    Setup Time [id number]   Final    Culture     Final    Value: ESCHERICHIA COLI     Note: Gram Stain Report Called to,Read Back By and Verified With: HARRIS C AT 1540 09/24/11 BY Garth Schlatter M Performed at [facility]   Report Status 09/27/2011 FINAL   Final    Organism ID, Bacteria ESCHERICHIA COLI   Final   CULTURE, BLOOD (ROUTINE X 2)     Status: Normal (Preliminary result)   Collection Time   09/23/11 11:57 AM      Component Value Range Status Comment   Specimen Description BLOOD LEFT HAND 2ND SITE   Final    Special Requests BOTTLES DRAWN AEROBIC AND ANAEROBIC 4CC   Final    Culture     Final    Value: GRAM POSITIVE COCCI GRAM NEGATIVE RODS     Gram Stain Report Called to,Read Back  By and Verified With: WAGNER,R @ 0510 ON 09/28/11 BY WOODIE,J     GS DONE @ [facility]   Report Status PENDING   Incomplete    Medical History: Past Medical History  Diagnosis Date  . Hypertension   . Diabetes mellitus   . Hypothyroidism   . Depression    Medications:  Scheduled:     . antiseptic oral rinse   Mouth Rinse Q4H  . diltiazem  180 mg Oral Daily  . hydrocortisone sodium succinate  25 mg Intravenous Daily  . imipenem-cilastatin  500 mg Intravenous Q8H  . insulin aspart  0-9 Units Subcutaneous Q4H  . insulin glargine  5 Units Subcutaneous QAM  . levothyroxine  50 mcg Oral QAC breakfast  . potassium chloride  40 mEq Oral BID  . potassium chloride  40 mEq Oral BID  . ramipril  10 mg Oral BID  . sodium chloride  10 mL Intracatheter Q12H  . Travoprost (BAK Free)  1 drop Both Eyes QHS  . vancomycin  1,500 mg Intravenous Q12H  . DISCONTD: hydrocortisone sodium succinate  25 mg Intravenous Q12H  . DISCONTD: vancomycin  1,500 mg Intravenous Q24H   Assessment: Renal fxn improving  Goal of Therapy:  Eradicate  infection. Vancomycin trough 15-20.  Plan: Change Primaxin to 500mg  iv q8 hrs Change to Vancomycin 1500mg  IV every 12 hours. Vancomycin trough on 09-29-11.  Gilman Buttner, [place] J 09/28/2011,9:55 AM

## 2011-09-28 NOTE — Progress Notes (Signed)
Physical Therapy Evaluation Patient Details Name: Garrett Bradley MRN: [id number] DOB: Apr 29, 1934 Today's Date: 09/28/2011  Problem List:  Patient Active Problem List  Diagnoses  . Septic shock  . Toxic encephalopathy  . ARF (acute renal failure)  . CKD (chronic kidney disease), stage IV  . S/P TURP  . Hypoxemia  . Nausea & vomiting  . DM type 2 causing CKD stage 4  . Benign hypertension  . Hypothyroidism  . Anemia  . BPH (benign prostatic hyperplasia)  . Thrombocytopenia  . UTI (lower urinary tract infection)  . Coagulopathy  . Hypokalemia  . Bacteremia  . Nausea alone  . Hypernatremia    Past Medical History:  Past Medical History  Diagnosis Date  . Hypertension   . Diabetes mellitus   . Hypothyroidism   . Depression    Past Surgical History:  Past Surgical History  Procedure Date  . Hernia repair   . Transurethral resection of prostate   . Transurethral resection of prostate 09/22/2011    Procedure: TRANSURETHRAL RESECTION OF THE PROSTATE (TURP);  Surgeon: Ky Barban;  Location: [facility];  Service: Urology;  Laterality: N/A;    PT Assessment/Plan/Recommendation PT Assessment Clinical Impression Statement: very cooperative pt who is mildly deconditioned due to recent illness...had been independent at home PTA...should be able to return home at d/c with HHPT ...no known DME needs PT Recommendation/Assessment: Patient will need skilled PT in the acute care venue PT Problem List: Decreased strength;Decreased activity tolerance;Cardiopulmonary status limiting activity;Decreased knowledge of use of DME Barriers to Discharge: None PT Therapy Diagnosis : Difficulty walking;Generalized weakness PT Plan PT Frequency: Min 3X/week PT Treatment/Interventions: DME instruction;Gait training;Functional mobility training;Therapeutic exercise;Patient/family education PT Recommendation Follow Up Recommendations: Home health PT Equipment Recommended: Defer to next  venue PT Goals  Acute Rehab PT Goals PT Goal Formulation: With patient/family Time For Goal Achievement: 2 weeks Pt will go Supine/Side to Sit: Independently Pt will go Sit to Supine/Side: Independently Pt will Transfer Sit to Stand/Stand to Sit: with modified independence Pt will Ambulate: 16 - 50 feet;with rolling walker;with supervision  PT Evaluation Precautions/Restrictions  Precautions Required Braces or Orthoses: No Restrictions Weight Bearing Restrictions: No Prior Functioning  Home Living Lives With: Spouse Receives Help From: Family Type of Home: House Home Layout: Two level;Laundry or work area in basement;Able to live on main level with bedroom/bathroom Alternate Level Stairs-Rails: Right Alternate Level Stairs-Number of Steps: full flight Home Access: Ramped entrance Bathroom Shower/Tub: Engineer, manufacturing systems: Standard Bathroom Accessibility: Yes How Accessible: Accessible via walker Home Adaptive Equipment: Walker - rolling;Straight cane Prior Function Level of Independence: Independent with basic ADLs;Independent with gait;Independent with transfers;Independent with homemaking with ambulation Driving: Yes Vocation: Retired Financial risk analyst Arousal/Alertness: Awake/alert Overall Cognitive Status: Appears within functional limits for tasks assessed Orientation Level: Oriented X4 Sensation/Coordination Sensation Light Touch: Appears Intact Proprioception: Appears Intact Coordination Gross Motor Movements are Fluid and Coordinated: Yes Fine Motor Movements are Fluid and Coordinated: Yes Extremity Assessment RUE Assessment RUE Assessment: Within Functional Limits LUE Assessment LUE Assessment: Within Functional Limits RLE Assessment RLE Assessment: Within Functional Limits LLE Assessment LLE Assessment: Within Functional Limits Mobility (including Balance) Bed Mobility Bed Mobility: Yes Supine to Sit: 7: Independent Sitting - Scoot to  Edge of Bed: 7: Independent Transfers Transfers: Yes Sit to Stand: 5: Supervision Sit to Stand Details (indicate cue type and reason): cues for using armsto assist anterior weight shift Stand to Sit: 6: Modified independent (Device/Increase time) Ambulation/Gait Ambulation/Gait: Yes Ambulation/Gait Assistance: 6: Modified  independent (Device/Increase time) Ambulation Distance (Feet): 10 Feet (limited by ICU lines) Gait Pattern: Within Functional Limits Stairs: No Wheelchair Mobility Wheelchair Mobility: No  Posture/Postural Control Posture/Postural Control: No significant limitations Balance Balance Assessed: No Exercise    End of Session PT - End of Session Equipment Utilized During Treatment: Gait belt Activity Tolerance: Patient tolerated treatment well Patient left: in chair;with call bell in reach;with family/visitor present Nurse Communication: Mobility status for transfers;Mobility status for ambulation General Behavior During Session: [place] for tasks performed Cognition: [facility] for tasks performed  Konrad Penta 09/28/2011, 1:45 PM

## 2011-09-28 NOTE — Progress Notes (Signed)
Subjective:  The patient complains of nausea this morning. He has had no vomiting. He denies abdominal pain.  Objective: Vital signs in last 24 hours: Filed Vitals:   09/28/11 0400 09/28/11 0417 09/28/11 0500 09/28/11 0600  BP: 178/93  156/76 157/93  Pulse: 79  56 60  Temp:  98.4 F (36.9 C)    TempSrc:  Oral    Resp: 19  16 15   Height:      Weight:  130.1 kg (286 lb 13.1 oz)    SpO2: 97%  95% 96%    CVP is 8  Weight change: -0.8 kg (-1 lb 12.2 oz)  Intake/Output Summary (Last 24 hours) at 09/28/11 0820 Last data filed at 09/28/11 0865  Gross per 24 hour  Intake 2422.67 ml  Output   4350 ml  Net -1927.33 ml   General: NAD. Lungs:Clear anteriorly, decreased breath sounds in the bases. Breathing comfortably. Cardiovascular: S1, S2, with systolic murmur. Abdomen: Obese, bowel sounds present soft nontender nondistended GU: Foley catheter inserted. Urine is now clear.. Extremities: warm diminished pulses, no edema Neuro: Alert, oriented to self, wife, daughter, and now oriented to place.  Lab Results: Basic Metabolic Panel:  Lab 09/28/11 7846 09/27/11 0508 09/26/11 0457 09/24/11 0430  NA 149* 144 -- --  K 2.9* 2.7* -- --  CL 108 106 -- --  CO2 33* 30 -- --  GLUCOSE 163* 156* -- --  BUN 42* 52* -- --  CREATININE 1.31 1.69* -- --  CALCIUM 8.6 8.2* -- --  MG 2.3 -- 2.1 --  PHOS -- -- -- 2.4   Liver Function Tests:  Lab 09/25/11 0532 09/24/11 0934  AST 40* 39*  ALT 21 25  ALKPHOS 75 61  BILITOT 0.6 0.7  PROT 6.1 5.7*  ALBUMIN 2.1* 2.1*    Lab 09/23/11 1145  LIPASE 14  AMYLASE --   No results found for this basename: AMMONIA:2 in the last 168 hours CBC:  Lab 09/27/11 0508 09/26/11 0457 09/24/11 0457  WBC 37.1* 36.2* --  NEUTROABS 33.4* -- 20.6*  HGB 10.6* 10.1* --  HCT 30.9* 29.2* --  MCV 82.8 84.1 --  PLT 82* 88* --   Cardiac Enzymes:  Lab 09/23/11 1419  CKTOTAL 289*  CKMB 2.5  CKMBINDEX --  TROPONINI <0.30   BNP:  Lab 09/23/11 1358    POCBNP 882.4*   D-Dimer: No results found for this basename: DDIMER:2 in the last 168 hours CBG:  Lab 09/28/11 0432 09/27/11 2357 09/27/11 1940 09/27/11 1636 09/27/11 1135 09/27/11 0745  GLUCAP 168* 191* 179* 184* 202* 160*   Hemoglobin A1C: No results found for this basename: HGBA1C in the last 168 hours Fasting Lipid Panel: No results found for this basename: CHOL,HDL,LDLCALC,TRIG,CHOLHDL,LDLDIRECT in the last 962 hours Thyroid Function Tests:  Lab 09/25/11 0532 09/24/11 0430 09/23/11 1145  TSH -- -- 7.139*  T4TOTAL -- -- --  FREET4 1.04 -- --  T3FREE -- 1.4* --  THYROIDAB -- -- --   Anemia Panel: No results found for this basename: VITAMINB12,FOLATE,FERRITIN,TIBC,IRON,RETICCTPCT in the last 168 hours  Alcohol Level: No results found for this basename: ETH:2 in the last 168 hours Urinalysis: Specific gravity 1.010. PH 6.0. Negative nitrites. Small leukocyte esterase. 21-50 white blood cells. Too numerous to count red cells. Many bacteria.  ABG    Component Value Date/Time   PHART 7.352 09/25/2011 0548   HCO3 21.6 09/25/2011 0548   TCO2 20.3 09/25/2011 0548   ACIDBASEDEF 3.1* 09/25/2011 0548   O2SAT  99.1 09/25/2011 0548    Micro Results: Recent Results (from the past 240 hour(s))  SURGICAL PCR SCREEN     Status: Normal   Collection Time   09/21/11  2:02 PM      Component Value Range Status Comment   MRSA, PCR NEGATIVE  NEGATIVE  Final    Staphylococcus aureus NEGATIVE  NEGATIVE  Final   URINE CULTURE     Status: Normal   Collection Time   09/23/11 11:08 AM      Component Value Range Status Comment   Specimen Description URINE, CATHETERIZED   Final    Special Requests NONE   Final    Setup Time [id number]   Final    Colony Count >=100,000 COLONIES/ML   Final    Culture ESCHERICHIA COLI   Final    Report Status 09/25/2011 FINAL   Final    Organism ID, Bacteria ESCHERICHIA COLI   Final   CULTURE, BLOOD (ROUTINE X 2)     Status: Normal   Collection Time    09/23/11 11:44 AM      Component Value Range Status Comment   Specimen Description BLOOD LEFT HAND 1ST SITE   Final    Special Requests BOTTLES DRAWN AEROBIC AND ANAEROBIC 4CC   Final    Setup Time [id number]   Final    Culture     Final    Value: ESCHERICHIA COLI     Note: Gram Stain Report Called to,Read Back By and Verified With: HARRIS C AT 1540 09/24/11 BY Garth Schlatter M Performed at [facility]   Report Status 09/27/2011 FINAL   Final    Organism ID, Bacteria ESCHERICHIA COLI   Final   CULTURE, BLOOD (ROUTINE X 2)     Status: Normal (Preliminary result)   Collection Time   09/23/11 11:57 AM      Component Value Range Status Comment   Specimen Description BLOOD LEFT HAND 2ND SITE   Final    Special Requests BOTTLES DRAWN AEROBIC AND ANAEROBIC 4CC   Final    Culture     Final    Value: GRAM POSITIVE COCCI GRAM NEGATIVE RODS     Gram Stain Report Called to,Read Back By and Verified With: WAGNER,R @ 0510 ON 09/28/11 BY WOODIE,J     GS DONE @ [facility]   Report Status PENDING   Incomplete    Studies/Results: No results found. Scheduled Meds:    . antiseptic oral rinse   Mouth Rinse Q4H  . diltiazem  180 mg Oral Daily  . hydrocortisone sodium succinate  25 mg Intravenous Daily  . imipenem-cilastatin  500 mg Intravenous Q8H  . insulin aspart  0-9 Units Subcutaneous Q4H  . insulin glargine  5 Units Subcutaneous QAM  . levothyroxine  50 mcg Oral QAC breakfast  . potassium chloride  40 mEq Oral BID  . potassium chloride  40 mEq Oral BID  . ramipril  10 mg Oral BID  . sodium chloride  10 mL Intracatheter Q12H  . Travoprost (BAK Free)  1 drop Both Eyes QHS  . vancomycin  1,500 mg Intravenous Q24H  . DISCONTD: diltiazem  300 mg Oral Daily  . DISCONTD: hydrocortisone sodium succinate  25 mg Intravenous Q6H  . DISCONTD: hydrocortisone sodium succinate  25 mg Intravenous Q12H  . DISCONTD: imipenem-cilastatin  250 mg Intravenous Q6H   Continuous Infusions:    . 0.45 % NaCl with  KCl 20 mEq / L    . DISCONTD: sodium chloride  50 mL/hr at 09/27/11 0800  . DISCONTD: 0.9 % NaCl with KCl 20 mEq / L 50 mL/hr at 09/28/11 0600  . DISCONTD: norepinephrine (LEVOPHED) Adult infusion Stopped (09/25/11 0100)  . DISCONTD: sodium chloride 0.9 % 1,000 mL with potassium chloride 20 mEq infusion    . DISCONTD: vasopressin (PITRESSIN) infusion - *FOR SHOCK* Stopped (09/24/11 2300)   PRN Meds:.acetaminophen, acetaminophen, ALPRAZolam, ondansetron (ZOFRAN) IV, sodium chloride Assessment/Plan: Principal Problem:  *Septic shock secondary to Escherichia coli UTI, GNR bacteremia, status post recent instrumentation. He is significantly improved. Continue Primaxin. Vancomycin restarted because of the increasing white blood cell count and the central line.. Blood pressure much improved; pressors were discontinued. We'll decrease the dose of IV hydrocortisone.  Nausea with a benign abdomen. We'll order an acute abdominal series and lipase for further evaluation.  Leukocytosis. This may be secondary to IV steroids.   ARF on Stage 4 CKD. (acute renal failure) secondary to sepsis/ATN/prerenal most likely. The renal ultrasound shows no hydronephrosis. His urine output has been adequate. BUN and creatinine are improved, and basically normal now. His urine output has picked up substantially. We'll watch for postobstructive diuresis.  Hypernatremia. His serum sodium has increased over the past 24 hours. His intake/output is negative approximately 1900 cc. I am concerned about postobstructive diuresis. We will change his IV fluids to half-normal saline at 75 cc an hour. Strict I.'s and O.'s.   Toxic encephalopathy: Resolved   S/P TURP: Per Dr. Jerre Simon.   Hypoxic respiratory failure with respiratory acidosis. Resolved. S/p BiPAP and IV lasix.    DM type 2. Controlled. Advance diet as tolerated.    Hypothyroidism: TSH is 7. Free T4 is WNL at 1.04. Will maintain Synthroid at 50 mcg daily in setting of  critical illness. Changed to po.   Nausea & vomiting: Resolved    HTN.  His blood pressure has been trending up and therefore we will start Cardizem at a lower dose. We'll titrate to home dose accordingly.   Anemia: Multifactorial. Dilutional, hematuria, critical illness.  Thrombocytopenia, likely secondary to sepsis. This will be followed.  Coagulopathy. Secondary to sepsis; better.   BPH (benign prostatic hyperplasia)   Hypokalemia. Still low. Continue to supplement.   Hypomagnesemia: Repleted. Follow up magnesium is WNL.  Deconditioning. PT ordered.  Will transfer the patient to telemetry today.  Order additional laboratory studies in the morning.  Discussed with the patient's wife.   LOS: 6 days   Jaegar Croft 09/28/2011, 8:20 AM

## 2011-09-29 ENCOUNTER — Encounter (HOSPITAL_COMMUNITY): Payer: Medicare FFS

## 2011-09-29 ENCOUNTER — Inpatient Hospital Stay (HOSPITAL_COMMUNITY): Payer: Medicare FFS

## 2011-09-29 DIAGNOSIS — K567 Ileus, unspecified: Secondary | ICD-10-CM | POA: Diagnosis not present

## 2011-09-29 LAB — DIFFERENTIAL
Eosinophils Absolute: 0.1 10*3/uL (ref 0.0–0.7)
Eosinophils Relative: 0 % (ref 0–5)
Lymphs Abs: 1.6 10*3/uL (ref 0.7–4.0)
Monocytes Absolute: 1.7 10*3/uL — ABNORMAL HIGH (ref 0.1–1.0)
Monocytes Relative: 7 % (ref 3–12)

## 2011-09-29 LAB — CBC
HCT: 34.5 % — ABNORMAL LOW (ref 39.0–52.0)
MCH: 28.8 pg (ref 26.0–34.0)
MCV: 86.3 fL (ref 78.0–100.0)
Platelets: 107 10*3/uL — ABNORMAL LOW (ref 150–400)
RBC: 4 MIL/uL — ABNORMAL LOW (ref 4.22–5.81)

## 2011-09-29 LAB — BASIC METABOLIC PANEL
CO2: 34 mEq/L — ABNORMAL HIGH (ref 19–32)
Calcium: 8.3 mg/dL — ABNORMAL LOW (ref 8.4–10.5)
Glucose, Bld: 179 mg/dL — ABNORMAL HIGH (ref 70–99)
Sodium: 150 mEq/L — ABNORMAL HIGH (ref 135–145)

## 2011-09-29 LAB — GLUCOSE, CAPILLARY
Glucose-Capillary: 203 mg/dL — ABNORMAL HIGH (ref 70–99)
Glucose-Capillary: 207 mg/dL — ABNORMAL HIGH (ref 70–99)
Glucose-Capillary: 200 mg/dL — ABNORMAL HIGH (ref 70–99)

## 2011-09-29 MED ORDER — POLYETHYLENE GLYCOL 3350 17 G PO PACK
17.0000 g | PACK | Freq: Every day | ORAL | Status: DC
  Administered 2011-09-29 – 2011-10-02 (×4): 17 g via ORAL
  Filled 2011-09-29 (×4): qty 1

## 2011-09-29 MED ORDER — POTASSIUM CHLORIDE 20 MEQ/15ML (10%) PO LIQD
40.0000 meq | ORAL | Status: AC
  Administered 2011-09-30 (×3): 40 meq via ORAL
  Filled 2011-09-29 (×3): qty 30

## 2011-09-29 MED ORDER — DILTIAZEM HCL ER COATED BEADS 180 MG PO CP24
300.0000 mg | ORAL_CAPSULE | Freq: Every day | ORAL | Status: DC
  Administered 2011-09-29 – 2011-09-30 (×2): 300 mg via ORAL
  Filled 2011-09-29 (×2): qty 1

## 2011-09-29 MED ORDER — POTASSIUM CL IN DEXTROSE 5% 20 MEQ/L IV SOLN
20.0000 meq | INTRAVENOUS | Status: DC
  Administered 2011-09-29 – 2011-09-30 (×3): 20 meq via INTRAVENOUS

## 2011-09-29 MED ORDER — INSULIN ASPART 100 UNIT/ML ~~LOC~~ SOLN
0.0000 [IU] | Freq: Three times a day (TID) | SUBCUTANEOUS | Status: DC
  Administered 2011-09-30: 3 [IU] via SUBCUTANEOUS
  Administered 2011-09-30 – 2011-10-01 (×4): 2 [IU] via SUBCUTANEOUS
  Administered 2011-10-01: 1 [IU] via SUBCUTANEOUS
  Administered 2011-10-02 (×2): 2 [IU] via SUBCUTANEOUS

## 2011-09-29 MED ORDER — SODIUM CHLORIDE 0.9 % IJ SOLN
10.0000 mL | Freq: Two times a day (BID) | INTRAMUSCULAR | Status: DC
  Administered 2011-09-29 – 2011-10-01 (×3): 10 mL
  Filled 2011-09-29: qty 10

## 2011-09-29 MED ORDER — PANTOPRAZOLE SODIUM 40 MG IV SOLR
40.0000 mg | Freq: Two times a day (BID) | INTRAVENOUS | Status: DC
  Administered 2011-09-29 – 2011-09-30 (×2): 40 mg via INTRAVENOUS
  Filled 2011-09-29 (×2): qty 40

## 2011-09-29 MED ORDER — BISACODYL 10 MG RE SUPP
10.0000 mg | Freq: Every day | RECTAL | Status: DC | PRN
  Administered 2011-09-29: 10 mg via RECTAL
  Filled 2011-09-29: qty 1

## 2011-09-29 MED ORDER — SODIUM CHLORIDE 0.9 % IJ SOLN: 10.0000 mL | INTRAMUSCULAR | Status: DC | PRN

## 2011-09-29 MED ORDER — POTASSIUM CHLORIDE CRYS ER 20 MEQ PO TBCR
40.0000 meq | EXTENDED_RELEASE_TABLET | Freq: Two times a day (BID) | ORAL | Status: DC
  Administered 2011-09-29 (×2): 40 meq via ORAL
  Filled 2011-09-29 (×2): qty 2

## 2011-09-29 MED ORDER — VANCOMYCIN HCL 1000 MG IV SOLR
1250.0000 mg | Freq: Two times a day (BID) | INTRAVENOUS | Status: DC
  Administered 2011-09-29 – 2011-09-30 (×3): 1250 mg via INTRAVENOUS
  Filled 2011-09-29 (×5): qty 1250

## 2011-09-29 NOTE — Progress Notes (Signed)
NAME:  JAKYE, MULLENS               ACCOUNT NO.:  [id number]  MEDICAL RECORD NO.:  [id number]  LOCATION:  IC07                          FACILITY:  [facility]  PHYSICIAN:  Ky Barban, M.D.DATE OF BIRTH:  06/06/1934  DATE OF PROCEDURE: DATE OF DISCHARGE:                                PROGRESS NOTE   The patient does not have any complaints.  He says he feels okay.  His blood pressure is 178/93.  Pulse 60 per minute, temperature 98.4, O2 saturation 97, CVP is 8.  Intake 2422.67 mL, output 4350 mL, net is - 1927.  Basic metabolic panel, sodium 149 potassium 2.9, chloride 108, CO2 is 33, glucose 163, BUN is 42 and creatinine 1.31.  WBC count is still markedly elevated, yesterday it was 37.1.  Hematocrit is 30.9. Urine is grossly clear.  IMPRESSION:  Leukocytosis probably secondary to IV steroid.  He has been placed in addition to Primaxin to vancomycin to cover his IV site because of leukocytosis and the patient is discharged from ICU.  As far as urological point of view, I think he can be discharged home when all these lines are removed.  His Foley catheter can be discontinued if he has had may be discontinued Foley catheter tomorrow.     Ky Barban, M.D.     MIJ/MEDQ  D:  09/28/2011  T:  09/29/2011  Job:  [id number]

## 2011-09-29 NOTE — Progress Notes (Signed)
ANTIBIOTIC CONSULT NOTE   Pharmacy Consult for Primaxin and Vancomycin (Restarted 09/26/2011). Indication: sepsis  No Known Allergies  Patient Measurements: Height: 6\' 4"  (193 cm) Weight: 283 lb 15.2 oz (128.8 kg) IBW/kg (Calculated) : 86.8   Vital Signs: Temp: 98.4 F (36.9 C) (10/23 0600) Temp src: Oral (10/23 0600) BP: 153/76 mmHg (10/23 1012) Intake/Output from previous day: 10/22 0701 - 10/23 0700 In: 1835 [P.O.:625; I.V.:710; IV Piggyback:500] Out: 4350 [Urine:4350] Intake/Output from this shift: Total I/O In: -  Out: 625 [Urine:625]  Labs:  [facility] 09/29/11 0457 09/28/11 0525 09/27/11 0508  WBC 24.9* -- 37.1*  HGB 11.5* -- 10.6*  PLT 107* -- 82*  LABCREA -- -- --  CREATININE 1.16 1.31 1.69*   Estimated Creatinine Clearance: 80.6 ml/min (by C-G formula based on Cr of 1.16).  Microbiology: Recent Results (from the past 720 hour(s))  URINE CULTURE     Status: Normal   Collection Time   09/08/11  5:51 PM      Component Value Range Status Comment   Specimen Description URINE, CLEAN CATCH   Final    Special Requests NONE   Final    Setup Time [id number]   Final    Colony Count NO GROWTH   Final    Culture NO GROWTH   Final    Report Status 09/09/2011 FINAL   Final   SURGICAL PCR SCREEN     Status: Normal   Collection Time   09/21/11  2:02 PM      Component Value Range Status Comment   MRSA, PCR NEGATIVE  NEGATIVE  Final    Staphylococcus aureus NEGATIVE  NEGATIVE  Final   URINE CULTURE     Status: Normal   Collection Time   09/23/11 11:08 AM      Component Value Range Status Comment   Specimen Description URINE, CATHETERIZED   Final    Special Requests NONE   Final    Setup Time [id number]   Final    Colony Count >=100,000 COLONIES/ML   Final    Culture ESCHERICHIA COLI   Final    Report Status 09/25/2011 FINAL   Final    Organism ID, Bacteria ESCHERICHIA COLI   Final   CULTURE, BLOOD (ROUTINE X 2)     Status: Normal   Collection Time   09/23/11 11:44 AM      Component Value Range Status Comment   Specimen Description BLOOD LEFT HAND 1ST SITE   Final    Special Requests BOTTLES DRAWN AEROBIC AND ANAEROBIC 4CC   Final    Setup Time [id number]   Final    Culture     Final    Value: ESCHERICHIA COLI     Note: Gram Stain Report Called to,Read Back By and Verified With: HARRIS C AT 1540 09/24/11 BY Garth Schlatter M Performed at [facility]   Report Status 09/27/2011 FINAL   Final    Organism ID, Bacteria ESCHERICHIA COLI   Final   CULTURE, BLOOD (ROUTINE X 2)     Status: Normal (Preliminary result)   Collection Time   09/23/11 11:57 AM      Component Value Range Status Comment   Specimen Description BLOOD LEFT HAND 2ND SITE   Final    Special Requests BOTTLES DRAWN AEROBIC AND ANAEROBIC 4CC   Final    Setup Time [id number]   Final    Culture     Final    Value: GRAM NEGATIVE RODS     Romie Minus  POSITIVE COCCI     Note: Gram Stain Report Called to,Read Back By and Verified With: WAGNER R @ 0510 09/28/11 BY Cristela Blue J Performed at [facility]   Report Status PENDING   Incomplete    Medical History: Past Medical History  Diagnosis Date  . Hypertension   . Diabetes mellitus   . Hypothyroidism   . Depression    Medications:  Scheduled:     . antiseptic oral rinse   Mouth Rinse Q4H  . diltiazem  300 mg Oral Daily  . imipenem-cilastatin  500 mg Intravenous Q8H  . insulin aspart  0-9 Units Subcutaneous Q4H  . insulin glargine  5 Units Subcutaneous QAM  . levothyroxine  50 mcg Oral QAC breakfast  . polyethylene glycol  17 g Oral Daily  . potassium chloride  40 mEq Oral BID  . potassium chloride  40 mEq Oral BID  . potassium chloride  40 mEq Oral BID  . ramipril  10 mg Oral BID  . sodium chloride  10 mL Intracatheter Q12H  . Travoprost (BAK Free)  1 drop Both Eyes QHS  . vancomycin  1,500 mg Intravenous Q12H  . DISCONTD: diltiazem  180 mg Oral Daily  . DISCONTD: hydrocortisone sodium succinate  25 mg  Intravenous Daily   Assessment: Renal fxn improving. Vancomycin trough slightly above goal.  Goal of Therapy:  Eradicate infection. Vancomycin trough 15-20 mcg per ml.  Plan: Continue Primaxin to 500mg  iv q8 hrs Change to Vancomycin 1250mg  IV every 12 hours.    Gilman Buttner, [place] J 09/29/2011,11:02 AM

## 2011-09-29 NOTE — Progress Notes (Signed)
Physical Therapy Treatment Patient Details Name: TINA TEMME MRN: [id number] DOB: 01/22/1934 Today's Date: 09/29/2011  TIME: 1235-1259/ 1 TE 1 GT  PT Assessment/Plan  PT - Assessment/Plan Comments on Treatment Session: Pt able to complete all exercises and ambulation feeling slightly fatigued;able to increase ambulation distance from yesterday by 6' and static standing balance with UE support for 3 mins during ambulation time as well PT Goals  Acute Rehab PT Goals PT Goal: Supine/Side to Sit - Progress: Met PT Goal: Sit to Supine/Side - Progress: Met PT Transfer Goal: Sit to Stand/Stand to Sit - Progress: Met PT Goal: Ambulate - Progress: Progressing toward goal  PT Treatment Precautions/Restrictions  Precautions Required Braces or Orthoses: No Restrictions Weight Bearing Restrictions: No Mobility (including Balance) Bed Mobility Supine to Sit: 7: Independent Sitting - Scoot to Edge of Bed: 7: Independent Transfers Transfers: Yes Sit to Stand: 5: Supervision Sit to Stand Details (indicate cue type and reason): RW;VCs for hand placement Stand to Sit: 5: Supervision Stand to Sit Details: VCs for hand placement Ambulation/Gait Ambulation/Gait: Yes Ambulation/Gait Assistance: 5: Supervision Ambulation/Gait Assistance Details (indicate cue type and reason): RW Ambulation Distance (Feet): 16 Feet Assistive device: Rolling walker Gait Pattern: Within Functional Limits Gait velocity: slow Stairs: No Wheelchair Mobility Wheelchair Mobility: No    Exercise  General Exercises - Upper Extremity Shoulder Flexion: Both;10 reps Shoulder Extension: Both;10 reps General Exercises - Lower Extremity Long Arc Quad: Both;20 reps Toe Raises: Both;20 reps Heel Raises: Both (20 reps) Mini-Sqauts: 5 reps Other Exercises Other Exercises: sit <>stand x3 End of Session PT - End of Session Equipment Utilized During Treatment: Gait belt (RW) Activity Tolerance: Patient tolerated  treatment well Patient left: in bed;with call bell in reach;with bed alarm set;with family/visitor present General Behavior During Session: [facility] for tasks performed Cognition: [facility] for tasks performed  Joss Mcdill ATKINSO 09/29/2011, 1:30 PM

## 2011-09-29 NOTE — Progress Notes (Signed)
Subjective: The patient says that his nausea is not quite as bad. He would like to try solid foods. Overall, his appetite has been poor.  Objective: Vital signs in last 24 hours: Filed Vitals:   09/28/11 2300 09/29/11 0500 09/29/11 0600 09/29/11 0839  BP:      Pulse:      Temp: 97.8 F (36.6 C)  98.4 F (36.9 C)   TempSrc: Oral  Oral   Resp:      Height:      Weight:  128.8 kg (283 lb 15.2 oz)    SpO2:    98%    CVP is 8  Weight change: -1.3 kg (-2 lb 13.9 oz)  Intake/Output Summary (Last 24 hours) at 09/29/11 0923 Last data filed at 09/29/11 0815  Gross per 24 hour  Intake 1647.5 ml  Output   4125 ml  Net -2477.5 ml   General: NAD. Lungs:Clear anteriorly, decreased breath sounds in the bases, no crackles or wheezes. Breathing comfortably. Cardiovascular: S1, S2, with systolic murmur. Abdomen: Obese, bowel sounds present, slightly hypoactive, soft nontender nondistended GU: Foley catheter inserted. Urine is now clear.. Extremities: warm diminished pulses, no edema Neuro: Alert, oriented to self, wife, daughter, and now oriented to place.  Lab Results: Basic Metabolic Panel:  Lab 09/29/11 7829 09/28/11 0525 09/26/11 0457 09/24/11 0430  NA 150* 149* -- --  K 3.2* 2.9* -- --  CL 110 108 -- --  CO2 34* 33* -- --  GLUCOSE 179* 163* -- --  BUN 32* 42* -- --  CREATININE 1.16 1.31 -- --  CALCIUM 8.3* 8.6 -- --  MG -- 2.3 2.1 --  PHOS -- -- -- 2.4   Liver Function Tests:  Lab 09/25/11 0532 09/24/11 0934  AST 40* 39*  ALT 21 25  ALKPHOS 75 61  BILITOT 0.6 0.7  PROT 6.1 5.7*  ALBUMIN 2.1* 2.1*    Lab 09/28/11 0525 09/23/11 1145  LIPASE 107* 14  AMYLASE -- --   No results found for this basename: AMMONIA:2 in the last 168 hours CBC:  Lab 09/29/11 0457 09/27/11 0508  WBC 24.9* 37.1*  NEUTROABS 21.3* 33.4*  HGB 11.5* 10.6*  HCT 34.5* 30.9*  MCV 86.3 82.8  PLT 107* 82*   Cardiac Enzymes:  Lab 09/23/11 1419  CKTOTAL 289*  CKMB 2.5  CKMBINDEX --    TROPONINI <0.30   BNP:  Lab 09/23/11 1358  POCBNP 882.4*   D-Dimer: No results found for this basename: DDIMER:2 in the last 168 hours CBG:  Lab 09/29/11 0826 09/28/11 2221 09/28/11 1633 09/28/11 1158 09/28/11 0800 09/28/11 0432  GLUCAP 203* 171* 202* 195* 164* 168*   Hemoglobin A1C: No results found for this basename: HGBA1C in the last 168 hours Fasting Lipid Panel: No results found for this basename: CHOL,HDL,LDLCALC,TRIG,CHOLHDL,LDLDIRECT in the last 562 hours Thyroid Function Tests:  Lab 09/25/11 0532 09/24/11 0430 09/23/11 1145  TSH -- -- 7.139*  T4TOTAL -- -- --  FREET4 1.04 -- --  T3FREE -- 1.4* --  THYROIDAB -- -- --   Anemia Panel: No results found for this basename: VITAMINB12,FOLATE,FERRITIN,TIBC,IRON,RETICCTPCT in the last 168 hours  Alcohol Level: No results found for this basename: ETH:2 in the last 168 hours Urinalysis: Specific gravity 1.010. PH 6.0. Negative nitrites. Small leukocyte esterase. 21-50 white blood cells. Too numerous to count red cells. Many bacteria.  ABG    Component Value Date/Time   PHART 7.352 09/25/2011 0548   HCO3 21.6 09/25/2011 0548   TCO2  20.3 09/25/2011 0548   ACIDBASEDEF 3.1* 09/25/2011 0548   O2SAT 99.1 09/25/2011 0548    Micro Results: Recent Results (from the past 240 hour(s))  SURGICAL PCR SCREEN     Status: Normal   Collection Time   09/21/11  2:02 PM      Component Value Range Status Comment   MRSA, PCR NEGATIVE  NEGATIVE  Final    Staphylococcus aureus NEGATIVE  NEGATIVE  Final   URINE CULTURE     Status: Normal   Collection Time   09/23/11 11:08 AM      Component Value Range Status Comment   Specimen Description URINE, CATHETERIZED   Final    Special Requests NONE   Final    Setup Time [id number]   Final    Colony Count >=100,000 COLONIES/ML   Final    Culture ESCHERICHIA COLI   Final    Report Status 09/25/2011 FINAL   Final    Organism ID, Bacteria ESCHERICHIA COLI   Final   CULTURE, BLOOD  (ROUTINE X 2)     Status: Normal   Collection Time   09/23/11 11:44 AM      Component Value Range Status Comment   Specimen Description BLOOD LEFT HAND 1ST SITE   Final    Special Requests BOTTLES DRAWN AEROBIC AND ANAEROBIC 4CC   Final    Setup Time [id number]   Final    Culture     Final    Value: ESCHERICHIA COLI     Note: Gram Stain Report Called to,Read Back By and Verified With: HARRIS C AT 1540 09/24/11 BY Garth Schlatter M Performed at [facility]   Report Status 09/27/2011 FINAL   Final    Organism ID, Bacteria ESCHERICHIA COLI   Final   CULTURE, BLOOD (ROUTINE X 2)     Status: Normal (Preliminary result)   Collection Time   09/23/11 11:57 AM      Component Value Range Status Comment   Specimen Description BLOOD LEFT HAND 2ND SITE   Final    Special Requests BOTTLES DRAWN AEROBIC AND ANAEROBIC 4CC   Final    Culture     Final    Value: GRAM POSITIVE COCCI GRAM NEGATIVE RODS     Gram Stain Report Called to,Read Back By and Verified With: WAGNER,R @ 0510 ON 09/28/11 BY WOODIE,J     GS DONE @ [facility]   Report Status PENDING   Incomplete    Studies/Results: Dg Abd Acute W/chest  09/28/2011  *RADIOLOGY REPORT*  Clinical Data: Nausea, acute renal failure  ACUTE ABDOMEN SERIES (ABDOMEN 2 VIEW & CHEST 1 VIEW)  Comparison: 09/24/2011  Findings: Cardiomegaly again noted.  Again noted mild congestion/edema.  Left basilar atelectasis or infiltrate.  Distended small bowel loops are noted mid abdomen suspicious for ileus or early bowel obstruction. No free abdominal air.  IMPRESSION: Question mild congestion/edema.  Left basilar atelectasis or infiltrate. Distended small bowel loops mid abdomen suspicious for ileus or early bowel obstruction.  Original Report Authenticated By: Natasha Mead, M.D.   Scheduled Meds:    . antiseptic oral rinse   Mouth Rinse Q4H  . diltiazem  300 mg Oral Daily  . imipenem-cilastatin  500 mg Intravenous Q8H  . insulin aspart  0-9 Units Subcutaneous Q4H  .  insulin glargine  5 Units Subcutaneous QAM  . levothyroxine  50 mcg Oral QAC breakfast  . polyethylene glycol  17 g Oral Daily  . potassium chloride  40 mEq Oral BID  .  potassium chloride  40 mEq Oral BID  . potassium chloride  40 mEq Oral BID  . ramipril  10 mg Oral BID  . sodium chloride  10 mL Intracatheter Q12H  . Travoprost (BAK Free)  1 drop Both Eyes QHS  . vancomycin  1,500 mg Intravenous Q12H  . DISCONTD: diltiazem  180 mg Oral Daily  . DISCONTD: hydrocortisone sodium succinate  25 mg Intravenous Daily  . DISCONTD: vancomycin  1,500 mg Intravenous Q24H   Continuous Infusions:    . dextrose 5 % with KCl 20 mEq / L    . DISCONTD: 0.45 % NaCl with KCl 20 mEq / L 50 mL (09/29/11 0809)   PRN Meds:.acetaminophen, acetaminophen, ALPRAZolam, bisacodyl, ondansetron (ZOFRAN) IV, sodium chloride Assessment/Plan: Principal Problem:  *Septic shock secondary to Escherichia coli UTI, Escherichia coli bacteremia, status post recent instrumentation. He is significantly improved. Continue Primaxin. Vancomycin restarted because of the increasing white blood cell count and the central line. Status post pressors and IV hydrocortisone. Will discontinue IV hydrocortisone today. We'll discontinue the central line and start a PICC line. Consider discontinuation of vancomycin tomorrow.  Hypernatremia. Likely associated with post obstructive/post ATN diuresis. Will change his IV fluids to D5 with potassium chloride added and increased the rate to 100 cc an hour. We'll adjust the rate according to his serum sodium. As his serum sodium comes down and/or when his intake and output difference is approximately 500 cc to 1 L, will slowly decrease the IV fluids. This was discussed per curbside consultation with Dr. Kristian Covey.  Acute renal failure superimposed on chronic kidney disease. His creatinine has actually normalized. He appears to be in postobstructive/post ATN diuresis stage.  Nausea, questionable  pancreatitis, ileus. Upon deep palpation, he has no abdominal tenderness. He is being treated supportively. We will still advance his diet and monitor closely. Will still try to advance his diet, give Dulcolax suppository, daily laxative therapy, and increase mobilization as tolerated.  Leukocytosis. This may be secondary to IV steroids, but will remove the central line just in case.  Toxic encephalopathy: Resolved  S/P TURP: Per Dr. Jerre Simon. Will keep the Foley catheter in to monitor his intake and output closely.   Hypoxic respiratory failure with respiratory acidosis. Resolved. S/p BiPAP and IV lasix.    DM type 2. Controlled. Advance diet as tolerated.    Hypothyroidism: TSH is 7. Free T4 is WNL at 1.04. Will maintain Synthroid at 50 mcg daily in setting of critical illness. Changed to po.   Nausea & vomiting: Resolved    HTN.  His blood pressure has been trending up and therefore we will start Cardizem at a lower dose. We'll titrate to home dose accordingly.   Anemia: Multifactorial. Dilutional, hematuria, critical illness. Better.  Thrombocytopenia, likely secondary to sepsis. His platelet count has improved.  Coagulopathy. Secondary to sepsis; better.   BPH (benign prostatic hyperplasia)   Hypokalemia. Still low. Continue to supplement.  Hypomagnesemia: Repleted. Follow up magnesium is WNL.  Deconditioning. He needs to be mobilized to help with the ileus.  Transferred to the telemetry  Order additional laboratory studies in the morning.  Discussed with the patient's wife.   LOS: 7 days   Clara Herbison 09/29/2011, 9:23 AM

## 2011-09-30 LAB — HEPATIC FUNCTION PANEL
ALT: 33 U/L (ref 0–53)
AST: 37 U/L (ref 0–37)
Alkaline Phosphatase: 134 U/L — ABNORMAL HIGH (ref 39–117)
Bilirubin, Direct: 0.1 mg/dL (ref 0.0–0.3)
Indirect Bilirubin: 0.5 mg/dL (ref 0.3–0.9)

## 2011-09-30 LAB — BASIC METABOLIC PANEL
BUN: 23 mg/dL (ref 6–23)
CO2: 31 mEq/L (ref 19–32)
Chloride: 107 mEq/L (ref 96–112)
Glucose, Bld: 206 mg/dL — ABNORMAL HIGH (ref 70–99)
Potassium: 3.9 mEq/L (ref 3.5–5.1)
Sodium: 145 mEq/L (ref 135–145)

## 2011-09-30 LAB — GLUCOSE, CAPILLARY
Glucose-Capillary: 203 mg/dL — ABNORMAL HIGH (ref 70–99)
Glucose-Capillary: 184 mg/dL — ABNORMAL HIGH (ref 70–99)

## 2011-09-30 LAB — CULTURE, BLOOD (ROUTINE X 2): Culture  Setup Time: 201210220410

## 2011-09-30 LAB — CBC
HCT: 30.8 % — ABNORMAL LOW (ref 39.0–52.0)
Hemoglobin: 10.2 g/dL — ABNORMAL LOW (ref 13.0–17.0)
MCH: 28.3 pg (ref 26.0–34.0)
MCHC: 33.1 g/dL (ref 30.0–36.0)
RBC: 3.6 MIL/uL — ABNORMAL LOW (ref 4.22–5.81)

## 2011-09-30 MED ORDER — SODIUM CHLORIDE 0.9 % IJ SOLN
INTRAMUSCULAR | Status: AC
  Filled 2011-09-30: qty 10

## 2011-09-30 MED ORDER — POTASSIUM CHLORIDE CRYS ER 20 MEQ PO TBCR
20.0000 meq | EXTENDED_RELEASE_TABLET | Freq: Every day | ORAL | Status: DC
  Administered 2011-10-01 – 2011-10-02 (×2): 20 meq via ORAL
  Filled 2011-09-30 (×2): qty 1

## 2011-09-30 MED ORDER — GLIMEPIRIDE 2 MG PO TABS
1.0000 mg | ORAL_TABLET | Freq: Every day | ORAL | Status: DC
  Administered 2011-10-01: 1 mg via ORAL
  Filled 2011-09-30: qty 1

## 2011-09-30 MED ORDER — CEFAZOLIN SODIUM 1-5 GM-% IV SOLN
1.0000 g | Freq: Three times a day (TID) | INTRAVENOUS | Status: DC
  Administered 2011-09-30 – 2011-10-02 (×5): 1 g via INTRAVENOUS
  Filled 2011-09-30 (×16): qty 50

## 2011-09-30 NOTE — Progress Notes (Signed)
Inpatient Diabetes Program Recommendations  AACE/ADA: New Consensus Statement on Inpatient Glycemic Control (2009)  Target Ranges:  Prepandial:   less than 140 mg/dL      Peak postprandial:   less than 180 mg/dL (1-2 hours)      Critically ill patients:  140 - 180 mg/dL   Reason for Visit: Elevated glucose;  203, 207, 144, 200 mg/dL  Inpatient Diabetes Program Recommendations Correction (SSI): Increase to Moderate Novolog Correction TID and HS Insulin - Meal Coverage: Add Novolog 3 units TID

## 2011-09-30 NOTE — Progress Notes (Signed)
Chart reviewed.  Subjective: Eating okay. No shortness of breath. No pain.  Objective: Vital signs in last 24 hours: Filed Vitals:   09/29/11 1800 09/29/11 2027 09/30/11 0500 09/30/11 0941  BP:  188/70 145/84   Pulse: 62 62 84 56  Temp:  97.4 F (36.3 C) 98.1 F (36.7 C)   TempSrc:  Oral Oral   Resp: 18 20 24    Height:  6\' 4"  (1.93 m)    Weight:  129.275 kg (285 lb)    SpO2: 96% 97% 98% 91%   Weight change: 0.475 kg (1 lb 0.8 oz)  Intake/Output Summary (Last 24 hours) at 09/30/11 1340 Last data filed at 09/30/11 0622  Gross per 24 hour  Intake   2730 ml  Output   2750 ml  Net    -20 ml   General: Sleepy and weak appearing. Answers questions and follows commands. Lungs clear to auscultation bilaterally without wheeze rhonchi or rales Cardiovascular regular rate rhythm without murmurs gallops rubs Abdomen soft nontender nondistended Extremities no clubbing cyanosis or edema signed  GU Foley catheter draining tea-colored urine.  Lab Results: Basic Metabolic Panel:  Lab 09/30/11 8295 09/29/11 0457 09/28/11 0525 09/26/11 0457 09/24/11 0430  NA 145 150* -- -- --  K 3.9 3.2* -- -- --  CL 107 110 -- -- --  CO2 31 34* -- -- --  GLUCOSE 206* 179* -- -- --  BUN 23 32* -- -- --  CREATININE 1.02 1.16 -- -- --  CALCIUM 8.3* 8.3* -- -- --  MG -- -- 2.3 2.1 --  PHOS -- -- -- -- 2.4   Liver Function Tests:  Lab 09/30/11 0527 09/25/11 0532  AST 37 40*  ALT 33 21  ALKPHOS 134* 75  BILITOT 0.6 0.6  PROT 6.0 6.1  ALBUMIN 2.0* 2.1*    Lab 09/30/11 0527 09/28/11 0525  LIPASE 103* 107*  AMYLASE -- --   No results found for this basename: AMMONIA:2 in the last 168 hours CBC:  Lab 09/30/11 0527 09/29/11 0457 09/27/11 0508  WBC 17.8* 24.9* --  NEUTROABS -- 21.3* 33.4*  HGB 10.2* 11.5* --  HCT 30.8* 34.5* --  MCV 85.6 86.3 --  PLT 104* 107* --   Cardiac Enzymes:  Lab 09/23/11 1419  CKTOTAL 289*  CKMB 2.5  CKMBINDEX --  TROPONINI <0.30   BNP:  Lab 09/23/11  1358  POCBNP 882.4*   D-Dimer: No results found for this basename: DDIMER:2 in the last 168 hours CBG:  Lab 09/30/11 1126 09/30/11 0748 09/30/11 0619 09/29/11 2112 09/29/11 1612 09/29/11 1228  GLUCAP 203* 177* 204* 200* 144* 207*   Hemoglobin A1C: No results found for this basename: HGBA1C in the last 168 hours Fasting Lipid Panel: No results found for this basename: CHOL,HDL,LDLCALC,TRIG,CHOLHDL,LDLDIRECT in the last 621 hours Thyroid Function Tests:  Lab 09/25/11 0532 09/24/11 0430  TSH -- --  T4TOTAL -- --  FREET4 1.04 --  T3FREE -- 1.4*  THYROIDAB -- --   Anemia Panel: No results found for this basename: VITAMINB12,FOLATE,FERRITIN,TIBC,IRON,RETICCTPCT in the last 168 hours   Micro Results: Recent Results (from the past 240 hour(s))  SURGICAL PCR SCREEN     Status: Normal   Collection Time   09/21/11  2:02 PM      Component Value Range Status Comment   MRSA, PCR NEGATIVE  NEGATIVE  Final    Staphylococcus aureus NEGATIVE  NEGATIVE  Final   URINE CULTURE     Status: Normal   Collection Time  09/23/11 11:08 AM      Component Value Range Status Comment   Specimen Description URINE, CATHETERIZED   Final    Special Requests NONE   Final    Setup Time [id number]   Final    Colony Count >=100,000 COLONIES/ML   Final    Culture ESCHERICHIA COLI   Final    Report Status 09/25/2011 FINAL   Final    Organism ID, Bacteria ESCHERICHIA COLI   Final   CULTURE, BLOOD (ROUTINE X 2)     Status: Normal   Collection Time   09/23/11 11:44 AM      Component Value Range Status Comment   Specimen Description BLOOD LEFT HAND 1ST SITE   Final    Special Requests BOTTLES DRAWN AEROBIC AND ANAEROBIC 4CC   Final    Setup Time [id number]   Final    Culture     Final    Value: ESCHERICHIA COLI     Note: Gram Stain Report Called to,Read Back By and Verified With: HARRIS C AT 1540 09/24/11 BY Garth Schlatter M Performed at [facility]   Report Status 09/27/2011 FINAL   Final     Organism ID, Bacteria ESCHERICHIA COLI   Final   CULTURE, BLOOD (ROUTINE X 2)     Status: Normal   Collection Time   09/23/11 11:57 AM      Component Value Range Status Comment   Specimen Description BLOOD LEFT HAND 2ND SITE   Final    Special Requests BOTTLES DRAWN AEROBIC AND ANAEROBIC 4CC   Final    Setup Time [id number]   Final    Culture     Final    Value: ESCHERICHIA COLI     Note: Gram Stain Report Called to,Read Back By and Verified With: WAGNER R @ 0510 09/28/11 BY Cristela Blue J Performed at [facility] PREVIOUSLY REPORTED AS GRAM POS COCCI AND GRAM NEGATIVE RODS CORRECTED RESULTS CALLED TO: TAMMY BLACKWELL 09/30/11      0830 BY SMITHERSJ   Report Status 09/30/2011 FINAL   Final    Organism ID, Bacteria ESCHERICHIA COLI   Final    Studies/Results: Dg Chest Portable 1 View  09/29/2011  *RADIOLOGY REPORT*  Clinical Data: PICC line placement  PORTABLE CHEST - 1 VIEW  Comparison: Portable exam 1133 hours compared to 09/24/2011  Findings: Right arm PICC line, tip projects over SVC. Enlargement of cardiac silhouette. Mediastinal contours and pulmonary vascularity normal. Persistent left lower lobe atelectasis versus consolidation. Improved aeration right base. No pneumothorax or right pleural effusion. Small left pleural effusion.  IMPRESSION: Tip of right arm PICC line projects over SVC. Enlargement of cardiac silhouette. Persistent left lower lobe atelectasis versus consolidation and small left pleural effusion.  Original Report Authenticated By: Lollie Marrow, M.D.   Scheduled Meds:   . antiseptic oral rinse   Mouth Rinse Q4H  . diltiazem  300 mg Oral Daily  . imipenem-cilastatin  500 mg Intravenous Q8H  . insulin aspart  0-9 Units Subcutaneous TID WC  . insulin glargine  5 Units Subcutaneous QAM  . levothyroxine  50 mcg Oral QAC breakfast  . pantoprazole (PROTONIX) IV  40 mg Intravenous Q12H  . polyethylene glycol  17 g Oral Daily  . potassium chloride  40 mEq Oral BID  .  potassium chloride  40 mEq Oral Q4H  . ramipril  10 mg Oral BID  . sodium chloride  10 mL Intracatheter Q12H  . sodium chloride  10 mL Intracatheter  Q12H  . sodium chloride      . Travoprost (BAK Free)  1 drop Both Eyes QHS  . vancomycin  1,250 mg Intravenous Q12H  . DISCONTD: insulin aspart  0-9 Units Subcutaneous Q4H  . DISCONTD: potassium chloride  40 mEq Oral BID   Continuous Infusions:   . 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 Discontinue Foley per Dr. Sylvie Farrier recommendations. Escherichia coli is pansensitive except for ampicillin. Will change to Ancef. Discontinue vancomycin. Discontinue IV fluids. Discontinue Lantus and resume Amaryl at a lower dose. Hopefully home in a day or 2. Will need home physical therapy   LOS: 8 days   Garrett Bradley L 09/30/2011, 1:40 PM

## 2011-09-30 NOTE — Progress Notes (Signed)
Physical Therapy Treatment Patient Details Name: Garrett Bradley MRN: [id number] DOB: Jan 24, 1934 Today's Date: 09/30/2011  TIME:[phone]/ 1 TE 1 GT  PT Assessment/Plan  PT - Assessment/Plan Comments on Treatment Session: Although pt able to ambulate further distance today, pt fatigued with bed exercises somewha,t needing a rest breakthe patient also needed more time today sitting up and scooting to EOB due to feeling tired. PT Goals  Acute Rehab PT Goals PT Goal: Supine/Side to Sit - Progress: Progressing toward goal PT Goal: Sit to Supine/Side - Progress: Progressing toward goal PT Transfer Goal: Sit to Stand/Stand to Sit - Progress: Progressing toward goal PT Goal: Ambulate - Progress: Met  PT Treatment Precautions/Restrictions  Precautions Required Braces or Orthoses: No Restrictions Weight Bearing Restrictions: No Mobility (including Balance) Bed Mobility Supine to Sit: 6: Modified independent (Device/Increase time) Sitting - Scoot to Edge of Bed: 6: Modified independent (Device/Increase time) Transfers Transfers: Yes Sit to Stand: 5: Supervision Sit to Stand Details (indicate cue type and reason): from elevated surface Stand to Sit: 5: Supervision Ambulation/Gait Ambulation/Gait: Yes Ambulation/Gait Assistance: 5: Supervision Ambulation/Gait Assistance Details (indicate cue type and reason): RW Ambulation Distance (Feet): 20 Feet Assistive device: Rolling walker Gait Pattern: Decreased dorsiflexion - left;Trunk flexed Stairs: No Wheelchair Mobility Wheelchair Mobility: No    Exercise  General Exercises - Lower Extremity Ankle Circles/Pumps: Both;10 reps Long Arc Quad: Both;10 reps Heel Slides: Both;10 reps Straight Leg Raises: Both;10 reps Toe Raises: Both;15 reps Heel Raises: Both;15 reps End of Session PT - End of Session Equipment Utilized During Treatment: Gait belt (RW) Activity Tolerance: Patient limited by fatigue Patient left: in bed;with call bell  in reach;with bed alarm set General Behavior During Session: [facility] for tasks performed Cognition: [facility] for tasks performed  Alivia Cimino ATKINSO 09/30/2011, 1:14 PM

## 2011-10-01 LAB — CBC
Hemoglobin: 10.4 g/dL — ABNORMAL LOW (ref 13.0–17.0)
MCH: 29.1 pg (ref 26.0–34.0)
MCHC: 34.2 g/dL (ref 30.0–36.0)
RDW: 14.8 % (ref 11.5–15.5)

## 2011-10-01 LAB — BASIC METABOLIC PANEL
Calcium: 8 mg/dL — ABNORMAL LOW (ref 8.4–10.5)
GFR calc Af Amer: 82 mL/min — ABNORMAL LOW (ref >90)
GFR calc non Af Amer: 71 mL/min — ABNORMAL LOW (ref >90)
Glucose, Bld: 159 mg/dL — ABNORMAL HIGH (ref 70–99)
Sodium: 138 mEq/L (ref 135–145)

## 2011-10-01 LAB — GLUCOSE, CAPILLARY
Glucose-Capillary: 185 mg/dL — ABNORMAL HIGH (ref 70–99)
Glucose-Capillary: 170 mg/dL — ABNORMAL HIGH (ref 70–99)

## 2011-10-01 MED ORDER — HYDROCHLOROTHIAZIDE 25 MG PO TABS
25.0000 mg | ORAL_TABLET | Freq: Every day | ORAL | Status: DC
  Administered 2011-10-01 – 2011-10-02 (×2): 25 mg via ORAL
  Filled 2011-10-01 (×2): qty 1

## 2011-10-01 MED ORDER — TERAZOSIN HCL 1 MG PO CAPS
2.0000 mg | ORAL_CAPSULE | Freq: Two times a day (BID) | ORAL | Status: DC
  Administered 2011-10-01 – 2011-10-02 (×3): 2 mg via ORAL
  Filled 2011-10-01: qty 2
  Filled 2011-10-01: qty 1
  Filled 2011-10-01: qty 2
  Filled 2011-10-01: qty 1

## 2011-10-01 MED ORDER — POTASSIUM CHLORIDE CRYS ER 20 MEQ PO TBCR
40.0000 meq | EXTENDED_RELEASE_TABLET | Freq: Once | ORAL | Status: AC
  Administered 2011-10-01: 40 meq via ORAL
  Filled 2011-10-01: qty 2

## 2011-10-01 MED ORDER — GLIMEPIRIDE 2 MG PO TABS
2.0000 mg | ORAL_TABLET | Freq: Every day | ORAL | Status: DC
  Administered 2011-10-02: 2 mg via ORAL
  Filled 2011-10-01: qty 1

## 2011-10-01 MED ORDER — DILTIAZEM HCL ER COATED BEADS 180 MG PO CP24
360.0000 mg | ORAL_CAPSULE | Freq: Every day | ORAL | Status: DC
  Administered 2011-10-02: 360 mg via ORAL
  Filled 2011-10-01 (×2): qty 2

## 2011-10-01 NOTE — Progress Notes (Signed)
Subjective: Feels weak. No problems urinating. No nausea. No shortness of breath. Eating, but appetite not yet back to normal.  Objective: Vital signs in last 24 hours: Filed Vitals:   09/30/11 2024 09/30/11 2129 10/01/11 0628 10/01/11 0650  BP:  162/86 207/81   Pulse:  70 60   Temp:  99.3 F (37.4 C) 99.2 F (37.3 C)   TempSrc:  Oral Oral   Resp:  20 20   Height:      Weight:      SpO2: 97% 94% 95% 96%   Weight change:   Intake/Output Summary (Last 24 hours) at 10/01/11 1100 Last data filed at 10/01/11 1012  Gross per 24 hour  Intake   2195 ml  Output   2650 ml  Net   -455 ml   General: Weak appearing, but slightly more interactive. Sitting in a chair. Lungs clear to auscultation bilaterally without wheeze rhonchi or rales Cardiovascular regular rate rhythm without murmurs gallops rubs Abdomen soft nontender nondistended Extremities trace edema.   Lab Results: Basic Metabolic Panel:  Lab 10/01/11 1610 09/30/11 0527 09/28/11 0525 09/26/11 0457  NA 138 145 -- --  K 3.3* 3.9 -- --  CL 101 107 -- --  CO2 32 31 -- --  GLUCOSE 159* 206* -- --  BUN 17 23 -- --  CREATININE 1.01 1.02 -- --  CALCIUM 8.0* 8.3* -- --  MG -- -- 2.3 2.1  PHOS -- -- -- --   Liver Function Tests:  Lab 09/30/11 0527 09/25/11 0532  AST 37 40*  ALT 33 21  ALKPHOS 134* 75  BILITOT 0.6 0.6  PROT 6.0 6.1  ALBUMIN 2.0* 2.1*    Lab 09/30/11 0527 09/28/11 0525  LIPASE 103* 107*  AMYLASE -- --   No results found for this basename: AMMONIA:2 in the last 168 hours CBC:  Lab 10/01/11 0555 09/30/11 0527 09/29/11 0457 09/27/11 0508  WBC 13.6* 17.8* -- --  NEUTROABS -- -- 21.3* 33.4*  HGB 10.4* 10.2* -- --  HCT 30.4* 30.8* -- --  MCV 84.9 85.6 -- --  PLT 113* 104* -- --   Cardiac Enzymes: No results found for this basename: CKTOTAL:3,CKMB:3,CKMBINDEX:3,TROPONINI:3 in the last 168 hours BNP: No results found for this basename: POCBNP:3 in the last 168 hours D-Dimer: No results  found for this basename: DDIMER:2 in the last 168 hours CBG:  Lab 10/01/11 0739 09/30/11 2105 09/30/11 1622 09/30/11 1126 09/30/11 0748 09/30/11 0619  GLUCAP 144* 183* 184* 203* 177* 204*   Hemoglobin A1C: No results found for this basename: HGBA1C in the last 168 hours Fasting Lipid Panel: No results found for this basename: CHOL,HDL,LDLCALC,TRIG,CHOLHDL,LDLDIRECT in the last 960 hours Thyroid Function Tests:  Lab 09/25/11 0532  TSH --  T4TOTAL --  FREET4 1.04  T3FREE --  THYROIDAB --   Anemia Panel: No results found for this basename: VITAMINB12,FOLATE,FERRITIN,TIBC,IRON,RETICCTPCT in the last 168 hours   Micro Results: Recent Results (from the past 240 hour(s))  SURGICAL PCR SCREEN     Status: Normal   Collection Time   09/21/11  2:02 PM      Component Value Range Status Comment   MRSA, PCR NEGATIVE  NEGATIVE  Final    Staphylococcus aureus NEGATIVE  NEGATIVE  Final   URINE CULTURE     Status: Normal   Collection Time   09/23/11 11:08 AM      Component Value Range Status Comment   Specimen Description URINE, CATHETERIZED   Final  Special Requests NONE   Final    Setup Time [id number]   Final    Colony Count >=100,000 COLONIES/ML   Final    Culture ESCHERICHIA COLI   Final    Report Status 09/25/2011 FINAL   Final    Organism ID, Bacteria ESCHERICHIA COLI   Final   CULTURE, BLOOD (ROUTINE X 2)     Status: Normal   Collection Time   09/23/11 11:44 AM      Component Value Range Status Comment   Specimen Description BLOOD LEFT HAND 1ST SITE   Final    Special Requests BOTTLES DRAWN AEROBIC AND ANAEROBIC 4CC   Final    Setup Time [id number]   Final    Culture     Final    Value: ESCHERICHIA COLI     Note: Gram Stain Report Called to,Read Back By and Verified With: HARRIS C AT 1540 09/24/11 BY Garth Schlatter M Performed at [facility]   Report Status 09/27/2011 FINAL   Final    Organism ID, Bacteria ESCHERICHIA COLI   Final   CULTURE, BLOOD (ROUTINE X 2)      Status: Normal   Collection Time   09/23/11 11:57 AM      Component Value Range Status Comment   Specimen Description BLOOD LEFT HAND 2ND SITE   Final    Special Requests BOTTLES DRAWN AEROBIC AND ANAEROBIC 4CC   Final    Setup Time [id number]   Final    Culture     Final    Value: ESCHERICHIA COLI     Note: Gram Stain Report Called to,Read Back By and Verified With: WAGNER R @ 0510 09/28/11 BY Cristela Blue J Performed at [facility] PREVIOUSLY REPORTED AS GRAM POS COCCI AND GRAM NEGATIVE RODS CORRECTED RESULTS CALLED TO: TAMMY BLACKWELL 09/30/11      0830 BY SMITHERSJ   Report Status 09/30/2011 FINAL   Final    Organism ID, Bacteria ESCHERICHIA COLI   Final    Studies/Results: Dg Chest Portable 1 View  09/29/2011  *RADIOLOGY REPORT*  Clinical Data: PICC line placement  PORTABLE CHEST - 1 VIEW  Comparison: Portable exam 1133 hours compared to 09/24/2011  Findings: Right arm PICC line, tip projects over SVC. Enlargement of cardiac silhouette. Mediastinal contours and pulmonary vascularity normal. Persistent left lower lobe atelectasis versus consolidation. Improved aeration right base. No pneumothorax or right pleural effusion. Small left pleural effusion.  IMPRESSION: Tip of right arm PICC line projects over SVC. Enlargement of cardiac silhouette. Persistent left lower lobe atelectasis versus consolidation and small left pleural effusion.  Original Report Authenticated By: Lollie Marrow, M.D.   Scheduled Meds:    . antiseptic oral rinse   Mouth Rinse Q4H  . ceFAZolin (ANCEF) IV  1 g Intravenous Q8H  . diltiazem  360 mg Oral Daily  . glimepiride  1 mg Oral Q breakfast  . hydrochlorothiazide  25 mg Oral Daily  . insulin aspart  0-9 Units Subcutaneous TID WC  . levothyroxine  50 mcg Oral QAC breakfast  . polyethylene glycol  17 g Oral Daily  . potassium chloride SA  20 mEq Oral Daily  . potassium chloride  40 mEq Oral Once  . ramipril  10 mg Oral BID  . sodium chloride  10 mL  Intracatheter Q12H  . sodium chloride  10 mL Intracatheter Q12H  . sodium chloride      . terazosin  2 mg Oral BID  . Travoprost (BAK Free)  1 drop Both Eyes QHS  . DISCONTD: diltiazem  300 mg Oral Daily  . DISCONTD: imipenem-cilastatin  500 mg Intravenous Q8H  . DISCONTD: insulin glargine  5 Units Subcutaneous QAM  . DISCONTD: pantoprazole (PROTONIX) IV  40 mg Intravenous Q12H  . DISCONTD: potassium chloride  40 mEq Oral BID  . DISCONTD: vancomycin  1,250 mg Intravenous Q12H   Continuous Infusions:    . DISCONTD: dextrose 5 % with KCl 20 mEq / L 20 mEq (09/30/11 1137)   PRN Meds:.acetaminophen, ALPRAZolam, bisacodyl, ondansetron (ZOFRAN) IV, sodium chloride, DISCONTD: acetaminophen, DISCONTD: sodium chloride  Assessment/Plan: Active Problems:  ARF (acute renal failure), resolved.  S/P TURP, doing well after removal of Foley catheter.  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 Will increase to 2 mg a day.  Hypothyroidism, continue current.  CKD (chronic kidney disease), stage IV  Nausea & vomiting, resolved.  Anemia  BPH (benign prostatic hyperplasia)  Thrombocytopenia  UTI (lower urinary tract infection), continue Ancef.  Hypokalemia: Replete. Malignant hypertension: Patient was on hydrochlorothiazide and Hytrin prior to admission. I will increase the Cardizem and resume HCTZ and Hytrin. Hopefully home tomorrow on oral antibiotics if stable.   LOS: 9 days   Lupe Bonner L 10/01/2011, 11:00 AM

## 2011-10-01 NOTE — Progress Notes (Signed)
Physical Therapy Treatment Patient Details Name: Garrett Bradley MRN: [id number] DOB: [birth year] Today's Date: 10/01/2011  [phone]/ 1 TE 1 GT  PT Assessment/Plan  PT - Assessment/Plan Comments on Treatment Session: Pt continues to increase gait distance and endurance daily with less asssistance needed PT Goals  Acute Rehab PT Goals PT Transfer Goal: Sit to Stand/Stand to Sit - Progress: Met  PT Treatment Precautions/Restrictions  Precautions Required Braces or Orthoses: No Restrictions Weight Bearing Restrictions: No Mobility (including Balance) Bed Mobility Bed Mobility: No Transfers Transfers: Yes Sit to Stand: 7: Independent Sit to Stand Details (indicate cue type and reason): from standard chair not elevated Stand to Sit: 7: Independent Ambulation/Gait Ambulation/Gait: Yes Ambulation/Gait Assistance: 6: Modified independent (Device/Increase time) Ambulation/Gait Assistance Details (indicate cue type and reason): RW Ambulation Distance (Feet): 40 Feet Assistive device: Rolling walker Gait velocity: slow Stairs: Yes    Exercise  General Exercises - Lower Extremity Long Arc Quad: Both;20 reps Hip ABduction/ADduction: Both;20 reps;Seated Toe Raises: Seated;Both;20 reps Heel Raises: Seated;Both (20 reps) Other Exercises Other Exercises: 5 mins of static standing with use of RW for endurance End of Session PT - End of Session Equipment Utilized During Treatment: Gait belt (RW) Activity Tolerance: Patient tolerated treatment well Patient left: in chair;with call bell in reach;with family/visitor present Nurse Communication: Mobility status for transfers General Behavior During Session: [facility] for tasks performed Cognition: [facility] for tasks performed  Garrett Bradley 10/01/2011, 10:25 AM

## 2011-10-01 NOTE — Progress Notes (Signed)
UR chart review completed.  

## 2011-10-02 DIAGNOSIS — B370 Candidal stomatitis: Secondary | ICD-10-CM | POA: Diagnosis not present

## 2011-10-02 DIAGNOSIS — I10 Essential (primary) hypertension: Secondary | ICD-10-CM | POA: Diagnosis present

## 2011-10-02 LAB — GLUCOSE, CAPILLARY
Glucose-Capillary: 168 mg/dL — ABNORMAL HIGH (ref 70–99)
Glucose-Capillary: 186 mg/dL — ABNORMAL HIGH (ref 70–99)

## 2011-10-02 MED ORDER — TERAZOSIN HCL 10 MG PO CAPS: 10.0000 mg | ORAL_CAPSULE | Freq: Every day | ORAL | Status: DC

## 2011-10-02 MED ORDER — ACETAMINOPHEN 325 MG PO TABS: 650.0000 mg | ORAL_TABLET | ORAL | Status: AC | PRN

## 2011-10-02 MED ORDER — NYSTATIN 100000 UNIT/ML MT SUSP
10.0000 mL | Freq: Once | OROMUCOSAL | Status: AC
  Administered 2011-10-02: 1000000 [IU] via ORAL
  Filled 2011-10-02: qty 10

## 2011-10-02 MED ORDER — CEPHALEXIN 500 MG PO CAPS: 500.0000 mg | ORAL_CAPSULE | Freq: Two times a day (BID) | ORAL | Status: DC

## 2011-10-02 MED ORDER — OMEPRAZOLE 20 MG PO CPDR: 20.0000 mg | DELAYED_RELEASE_CAPSULE | Freq: Every day | ORAL | Status: DC

## 2011-10-02 MED ORDER — OMEGA-3 FATTY ACIDS 1000 MG PO CAPS: 1.0000 g | ORAL_CAPSULE | Freq: Two times a day (BID) | ORAL | Status: DC

## 2011-10-02 MED ORDER — LEVOTHYROXINE SODIUM 50 MCG PO TABS: 50.0000 ug | ORAL_TABLET | Freq: Every day | ORAL | Status: DC

## 2011-10-02 MED ORDER — ALPRAZOLAM 1 MG PO TABS: 0.5000 mg | ORAL_TABLET | Freq: Three times a day (TID) | ORAL | Status: DC | PRN

## 2011-10-02 MED ORDER — FLUCONAZOLE 100 MG PO TABS: 100.0000 mg | ORAL_TABLET | Freq: Every day | ORAL | Status: AC

## 2011-10-02 NOTE — Progress Notes (Signed)
PICC removed from right upper arm. 44 cm in length. vaseline gauze dressing applied. No bleeding noted. Pt verbalized understanding of post picc instructions. Pt tolerated procedure well.

## 2011-10-02 NOTE — Discharge Summary (Signed)
Physician Discharge Summary  Patient ID: Garrett Bradley MRN: [id number] DOB/AGE: 75-Jul-1935 75 y.o.  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 Qty: 30 tablet    cephALEXin (KEFLEX) 500 MG capsule Take 1 capsule (500 mg total) by mouth 2 (two) times daily. Qty: 10 capsule, Refills: 0    fluconazole (DIFLUCAN) 100 MG tablet Take 1 tablet (100 mg total) by mouth daily. Qty: 7 tablet, Refills: 0    omeprazole (PRILOSEC) 20 MG capsule Take 1 capsule (20 mg total) by mouth daily. Qty: 14 capsule, Refills: 0      CONTINUE these medications which have CHANGED   Details  ALPRAZolam (XANAX) 1 MG tablet Take 0.5 tablets (0.5 mg total) by mouth 3 (three) times daily as needed for anxiety. For anxiety Qty: 30 tablet    fish oil-omega-3 fatty acids 1000 MG capsule Take 1 capsule (1 g total) by mouth 2 (two) times daily.    levothyroxine (SYNTHROID, LEVOTHROID) 50 MCG tablet Take 1 tablet (50 mcg total) by mouth daily.    terazosin (HYTRIN) 10 MG capsule Take 1 capsule (10 mg total) by mouth at bedtime.      CONTINUE these medications which have NOT CHANGED   Details  diltiazem (CARDIZEM CD) 300 MG 24 hr capsule Take 300 mg by mouth daily.      doxylamine, Sleep, (UNISOM) 25 MG tablet Take 25 mg by mouth at bedtime as  needed. For sleep     glimepiride (AMARYL) 4 MG tablet Take 4 mg by mouth every morning.     niacin (NIASPAN) 500 MG CR tablet Take 500 mg by mouth at bedtime.      nitroGLYCERIN (NITROSTAT) 0.4 MG SL tablet Place 0.4 mg under the tongue every 5 (five) minutes as needed.      potassium chloride (MICRO-K) 10 MEQ CR capsule Take 20 mEq by mouth daily.     ramipril (ALTACE) 10 MG capsule Take 10 mg by mouth 2 (two) times daily.      Travoprost, BAK Free, (TRAVATAMN) 0.004 % SOLN ophthalmic solution Place 1 drop into both eyes at bedtime.      hydrochlorothiazide 25 MG tablet Take 25 mg by mouth daily.        STOP taking these medications     aspirin EC 81 MG tablet      linagliptin (TRADJENTA) 5 MG TABS tablet         Discharge Orders    Future Orders Please Complete By Expires   Diet - low sodium heart healthy      Diet Carb Modified      Increase activity slowly      Driving Restrictions      Comments:   No Driving 2 weeks   Call MD for:  temperature >100.4      Call MD for:  persistant nausea  and vomiting         Follow-up Information    Follow up with JAVAID,MOHAMMAD I. (10:45 AM)    Contact information:   [address] [place] [address] [phone]       Follow up with Dr. Fran Lowes on 10/07/2011. (10:15 AM)       Follow up with [facility] [phone].      Follow up with [name],[name]. Make an appointment in 2 weeks.   Contact information:   [address] [place] [address] [place] [address] [phone]          Disposition: Home or Self Care with home health physical therapy and RN  Discharged Condition: Stable  Consults:   Dr. Jerre Simon, Dr. Suzette Battiest.  Procedures: TURP by Dr. Jerre Simon on 09/22/2011, CVL by Dr. Leticia Penna, PICC line  Labs:   Results for orders placed during the hospital encounter of 09/22/11 (from the past 48 hour(s))  GLUCOSE, CAPILLARY     Status: Abnormal   Collection Time    09/30/11  4:22 PM      Component Value Range Comment   Glucose-Capillary 184 (*) 70 - 99 (mg/dL)    Comment 1 Documented in Chart      Comment 2 Notify RN     GLUCOSE, CAPILLARY     Status: Abnormal   Collection Time   09/30/11  9:05 PM      Component Value Range Comment   Glucose-Capillary 183 (*) 70 - 99 (mg/dL)    Comment 1 Documented in Chart      Comment 2 Notify RN     CBC     Status: Abnormal   Collection Time   10/01/11  5:55 AM      Component Value Range Comment   WBC 13.6 (*) 4.0 - 10.5 (K/uL)    RBC 3.58 (*) 4.22 - 5.81 (MIL/uL)    Hemoglobin 10.4 (*) 13.0 - 17.0 (g/dL)    HCT 84.1 (*) 32.4 - 52.0 (%)    MCV 84.9  78.0 - 100.0 (fL)    MCH 29.1  26.0 - 34.0 (pg)    MCHC 34.2  30.0 - 36.0 (g/dL)    RDW 40.1  02.7 - 25.3 (%)    Platelets 113 (*) 150 - 400 (K/uL)   BASIC METABOLIC PANEL     Status: Abnormal   Collection Time   10/01/11  5:55 AM      Component Value Range Comment   Sodium 138  135 - 145 (mEq/L) DELTA CHECK NOTED   Potassium 3.3 (*) 3.5 - 5.1 (mEq/L)    Chloride 101  96 - 112 (mEq/L)    CO2 32  19 - 32 (mEq/L)    Glucose, Bld 159 (*) 70 - 99 (mg/dL)    BUN 17  6 - 23 (mg/dL)    Creatinine, Ser 6.64  0.50 - 1.35 (mg/dL)    Calcium 8.0 (*) 8.4 - 10.5 (mg/dL)    GFR calc non Af Amer 71 (*) >90 (mL/min)    GFR calc Af Amer 82 (*) >90 (mL/min)   GLUCOSE, CAPILLARY     Status: Abnormal   Collection Time   10/01/11  7:39 AM   Glucose-Capillary 144 (*) 70 - 99 (mg/dL)    Results for Garrett Bradley (MRN [id number]) as of 10/02/2011 12:23  Ref. Range 09/23/2011 14:00 09/23/2011 19:55 09/23/2011 22:10 09/24/2011 09:15 09/25/2011 03:16 09/25/2011 05:48  Total hemoglobin Latest Range: 13.5-18.0 g/dL  9.9 (  L) 9.8 (L)     Carboxyhemoglobin Latest Range: 0.5-1.5 %  1.0 0.9     Total oxygen content Latest Range: 15.0-23.0 mL/dL  8.7 (L) 9.1 (L)     Methemoglobin Latest Range: 0.0-1.5 %  1.8 (H) 1.8 (H)     Sample type No range found ARTERIAL   ARTERIAL ARTERIAL  ARTERIAL  Delivery systems No range found     OXYGEN MASK BILEVEL POSITIVE AIRWAY PRESSURE  FIO2 No range found 1.00    0.50 0.60  O2 Content No range found    4.0    Inspiratory PAP No range found      14  Expiratory PAP No range found      8  pH, Arterial Latest Range: 7.350-7.450  7.427   7.311 (L) 7.274 (L) 7.352  pCO2 arterial Latest Range: 35.0-45.0 mmHg 30.9 (L)   40.0 48.5 (H) 40.0  pO2, Arterial Latest Range: 80.0-100.0 mmHg 99.9   69.1 (L) 66.1 (L) 175.0 (H)  Bicarbonate Latest Range: 20.0-24.0 mEq/L 20.1   19.6 (L) 21.7 21.6  TCO2 Latest Range: 0-100 mmol/L 18.4   18.6 20.9 20.3  Acid-base deficit Latest Range: 0.0-2.0 mmol/L 3.6 (H)   5.6 (H) 4.0 (H) 3.1 (H)  O2 Saturation No range found 97.9 63.7 68.2 93.9 92.1 99.1  Patient temperature No range found 37.0   37.0 37.0 37.0  Collection site No range found LEFT RADIAL   RIGHT RADIAL A-LINE A-LINE  Allens test (pass/fail) Latest Range: PASS  PASS    NOT INDICATED (A) NOT INDICATED (A)   Results for ONIEL, MELESKI (MRN [id number]) as of 10/02/2011 12:23 urinalysis   Ref. Range 09/23/2011 11:08  Color, Urine Latest Range: YELLOW  RED (A)  Appearance Latest Range: CLEAR  CLOUDY (A)  Specific Gravity, Urine Latest Range: 1.005-1.030  1.010  pH Latest Range: 5.0-8.0  6.0  Glucose, UA Latest Range: NEGATIVE mg/dL NEGATIVE  Bilirubin Urine Latest Range: NEGATIVE  NEGATIVE  Ketones, ur Latest Range: NEGATIVE mg/dL NEGATIVE  Protein Latest Range: NEGATIVE mg/dL NEGATIVE  Urobilinogen, UA Latest Range: 0.0-1.0 mg/dL 0.2  Nitrite Latest Range: NEGATIVE  NEGATIVE  Leukocytes, UA Latest Range: NEGATIVE  SMALL (A)  WBC, UA Latest Range: <3 WBC/hpf 21-50  RBC / HPF Latest Range: <3 RBC/hpf TOO NUMEROUS TO COUNT  Bacteria, UA Latest Range: RARE  MANY (A)   Cardiac enzymes were negative on 09/23/2011. TSH was 7.1. Pro calcitonin on 09/23/2011 was 0.21. Venous lactic acid was 6. Random cortisol was 19. Urine culture grew out greater than  100,000 colonies of Escherichia coli which was sensitive to everything except for ampicillin. Blood cultures also rule out the same species of Escherichia coli. Diagnostics:  Ct Abdomen Pelvis Wo Contrast  09/16/2011  *RADIOLOGY REPORT*  Clinical Data: Difficulty urinating. History of kidney stones.  CT ABDOMEN AND PELVIS WITHOUT CONTRAST  Technique:  Multidetector CT imaging of the abdomen and pelvis was performed following the standard protocol without intravenous contrast.  Comparison: [facility] CT abdomen and pelvis exam from 07/23/2009.  Findings: Visualized portions of the liver and spleen have normal uninfused features.  The stomach, duodenum, pancreas, and adrenal glands are unremarkable.  Layering tiny calcified stones are seen in the lumen of the gallbladder.  Horseshoe kidney is again noted.  2 mm nonobstructing stone is seen in the midportion of the right moiety, as before.  7 x 9 x 5 mm stone in the lower pole of the left moiety appears slightly larger than  on the previous study.  The 11 mm calculus is seen in the region of the left UPJ, but again, this appears to be external and just posterior to the UPJ. There is no hydronephrosis in either moiety.  The left hydroureter which is seen on the previous study is not evident today.  No evidence for stones within either ureter. The urinary bladder is nondistended and shows no evidence for stones.  Of note, the Foley balloon is inflated within the prostatic/membranous urethra.  There is no free fluid in the abdomen.  No abdominal lymphadenopathy.  No abdominal aortic aneurysm.  Imaging through the pelvis shows no free fluid.  No pelvic sidewall lymphadenopathy.  No substantial diverticular change in the colon. No diverticulitis.  The terminal ileum and the appendix are normal.  Bone windows reveal no worrisome lytic or sclerotic osseous lesions.  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  Foley balloon is inflated  within the prostatic/membranous urethra with a decompressed urinary bladder.  I personally discussed these findings with Dr. Jerre Simon at 1319 hours on 09/16/2011.  Original Report Authenticated By: ERIC A. MANSELL, M.D.   US Renal  09/24/2011  *RADIOLOGY REPORT*  Clinical Data: Acute renal failure  RENAL/URINARY TRACT ULTRASOUND COMPLETE  Comparison:  CT dated 09/16/2011  Findings:  Evaluation is constrained by body habitus and overlying bowel gas.  Horsehoe kidney.  Right Kidney:  Right renal moiety measures approximately 12.1 cm. Poorly visualized.  No gross hydronephrosis.  Left Kidney:  Left renal moiety measures approximately 12.4 cm. Poorly visualized.  No gross hydronephrosis.  Bladder:  Distended despite indwelling Foley catheter.  Calculated bladder volume 641 ml.  IMPRESSION: Horseshoe kidney.  No gross hydronephrosis.  Distended bladder despite indwelling Foley catheter.  Clinical assessment is suggested.  Original Report Authenticated By: Charline Bills, M.D.   Dg Chest Portable 1 View  09/29/2011  *RADIOLOGY REPORT*  Clinical Data: PICC line placement  PORTABLE CHEST - 1 VIEW  Comparison: Portable exam 1133 hours compared to 09/24/2011  Findings: Right arm PICC line, tip projects over SVC. Enlargement of cardiac silhouette. Mediastinal contours and pulmonary vascularity normal. Persistent left lower lobe atelectasis versus consolidation. Improved aeration right base. No pneumothorax or right pleural effusion. Small left pleural effusion.  IMPRESSION: Tip of right arm PICC line projects over SVC. Enlargement of cardiac silhouette. Persistent left lower lobe atelectasis versus consolidation and small left pleural effusion.  Original Report Authenticated By: Lollie Marrow, M.D.   Dg Chest Portable 1 View  09/24/2011  *RADIOLOGY REPORT*  Clinical Data: Tachypnea, dyspnea  PORTABLE CHEST - 1 VIEW  Comparison: 09/23/2011; 05/13/2009  Findings:  Examination is degraded secondary to patient motion  artifact. Grossly unchanged enlarged cardiac silhouette and mediastinal contours.  Left subclavian approach central venous catheter tip is obscured by motion, seen at least to the level of the central aspect of the left brachiocephalic vein. Pulmonary vasculature remains distinct.  No change minimal increase in perihilar and bilateral medial basilar heterogeneous opacities with at least partial atelectasis of the right lower lobe. No definite pleural effusion or pneumothorax.  Grossly unchanged bones.  IMPRESSION: 1.  Examination degraded by patient motion artifact. 2.  No change to minimal increase in perihilar and bilateral medial basilar heterogeneous opacities with at least partial atelectasis of the right lower lobe. 3.  Query mild pulmonary edema.  Original Report Authenticated By: Waynard Reeds, M.D.   Dg Chest Portable 1 View  09/23/2011  *RADIOLOGY REPORT*  Clinical Data: Sepsis, evaluate  for pneumonia  PORTABLE CHEST - 1 VIEW  Comparison: Portable chest x-ray of 05/13/2009  Findings: There is mild basilar atelectasis present.  Cardiomegaly is stable.  No focal infiltrate or effusion is seen.  IMPRESSION: Stable cardiomegaly.  Mild basilar atelectasis.  Original Report Authenticated By: Juline Patch, M.D.   Dg Chest Port 1v Same Day  09/23/2011  *RADIOLOGY REPORT*  Clinical Data: Left central line placement.  PORTABLE CHEST - 1 VIEW SAME DAY  Comparison: Chest radiograph 09/23/2011  Findings: Cardiac leads project over the chest.  A left subclavian central venous catheter is in place.  The distal tip is in the left brachiocephalic vein.  Cardiomegaly is stable.  Lung volumes are low.  Mild bibasilar atelectasis is similar to recent prior examination.  No pneumothorax, pleural effusion or airspace disease is identified.  IMPRESSION: Left subclavian central venous catheter terminates in the left brachiocephalic vein.  No complicating feature identified.  Cardiomegaly and bibasilar atelectasis.   Original Report Authenticated By: Britta Mccreedy, M.D.   Dg Abd Acute W/chest  09/28/2011  *RADIOLOGY REPORT*  Clinical Data: Nausea, acute renal failure  ACUTE ABDOMEN SERIES (ABDOMEN 2 VIEW & CHEST 1 VIEW)  Comparison: 09/24/2011  Findings: Cardiomegaly again noted.  Again noted mild congestion/edema.  Left basilar atelectasis or infiltrate.  Distended small bowel loops are noted mid abdomen suspicious for ileus or early bowel obstruction. No free abdominal air.  IMPRESSION: Question mild congestion/edema.  Left basilar atelectasis or infiltrate. Distended small bowel loops mid abdomen suspicious for ileus or early bowel obstruction.  Original Report Authenticated By: Natasha Mead, M.D.   Pathology showed BPH with acute and chronic inflammation. Inflamed urothelium. No malignancy.  EKG: Sinus tachycardia with a rate of 106. Left axis deviation. Cannot rule out age indeterminate anterior infarct   Hospital Course: The patient is a 77 year old black male with history of multiple medical problems who was admitted to Dr. Jesse Fall service after having a TURP. He has BPH and had urinary retention. Postoperatively, he had a fever and chills. Hospitalist for consultation. He was started on broad-spectrum antibiotics and cultures were drawn. He had evidence of urinary tract infection. He developed hypotension and severe septic shock. He was transferred to the intensive care unit. Surgery was consulted and placed an emergent central line. He was fluid resuscitated and started on vasopressors. He had evidence of toxic metabolic encephalopathy. He developed acute respiratory failure with hypoxia and respiratory acidosis. He required oxygen and BiPAP briefly. This resolved. He had nausea and vomiting. He developed acute renal failure secondary to ATN and sepsis which resolved. Blood cultures grew out Escherichia coli. Urine culture grew out Escherichia coli as well. His antibiotics were narrowed. Central line was  discontinued and a PICC line was placed. His mental status and hypoxia cleared. His vomiting cleared. His hypotension eventually resolved and he was started back on his antihypertensives sequentially. At the time of discharge, he has stable vital signs, he is tolerating a regular diet, his various laboratory abnormalities have normalized and he has received maximal hospital benefit. He has worked with physical therapy and will go home with home nurse and physical therapy. He has received 9 days of IV antibiotics and can have another 5 days of oral antibiotics to cover the Escherichia coli UTI with sepsis.  His TSH was high and his Synthroid dose will be doubled. His blood sugars have remained stable. The day of discharge, he complained of iodine aphasia. On physical exam he had evidence of oral thrush  and I suspect he may have esophageal candidiasis. I will start him on Diflucan for another week. He can take a soft diet. Total time on the day of discharge is greater than 30 minutes.  Discharge Exam: Blood pressure 169/74, pulse 80, temperature 99.1 F (37.3 C), temperature source Oral, resp. rate 20, height 6\' 4"  (1.93 m), weight 129.275 kg (285 lb), SpO2 91.00%.  Thrush. Exam otherwise unchanged from 10/01/2011   Signed: Crista Curb L 10/02/2011, 12:17 PM

## 2011-10-02 NOTE — Progress Notes (Signed)
Discharge instructions reviewed with pt and wife. Both verbalized understanding. Pt accompanied to awaiting vehicle via wheelchair. Pt stable on discharge.

## 2011-10-13 ENCOUNTER — Emergency Department (HOSPITAL_COMMUNITY)
Admission: EM | Admit: 2011-10-13 | Discharge: 2011-10-13 | Disposition: A | Discharge status: Home / Self Care | Payer: Medicare FFS | Attending: Emergency Medicine | Admitting: Emergency Medicine

## 2011-10-13 ENCOUNTER — Other Ambulatory Visit: Payer: Self-pay

## 2011-10-13 ENCOUNTER — Encounter (HOSPITAL_COMMUNITY): Payer: Self-pay | Admitting: *Deleted

## 2011-10-13 ENCOUNTER — Encounter (HOSPITAL_COMMUNITY): Payer: Self-pay | Admitting: Emergency Medicine

## 2011-10-13 DIAGNOSIS — E119 Type 2 diabetes mellitus without complications: Secondary | ICD-10-CM | POA: Insufficient documentation

## 2011-10-13 DIAGNOSIS — F3289 Other specified depressive episodes: Secondary | ICD-10-CM | POA: Insufficient documentation

## 2011-10-13 DIAGNOSIS — I10 Essential (primary) hypertension: Secondary | ICD-10-CM | POA: Insufficient documentation

## 2011-10-13 DIAGNOSIS — E039 Hypothyroidism, unspecified: Secondary | ICD-10-CM | POA: Insufficient documentation

## 2011-10-13 DIAGNOSIS — F329 Major depressive disorder, single episode, unspecified: Secondary | ICD-10-CM | POA: Insufficient documentation

## 2011-10-13 DIAGNOSIS — R040 Epistaxis: Secondary | ICD-10-CM | POA: Insufficient documentation

## 2011-10-13 DIAGNOSIS — I440 Atrioventricular block, first degree: Secondary | ICD-10-CM | POA: Insufficient documentation

## 2011-10-13 LAB — POCT I-STAT, CHEM 8
Calcium, Ion: 1.11 mmol/L — ABNORMAL LOW (ref 1.12–1.32)
Glucose, Bld: 98 mg/dL (ref 70–99)
HCT: 30 % — ABNORMAL LOW (ref 39.0–52.0)
Hemoglobin: 10.2 g/dL — ABNORMAL LOW (ref 13.0–17.0)

## 2011-10-13 LAB — PROTIME-INR: Prothrombin Time: 14.8 seconds (ref 11.6–15.2)

## 2011-10-13 LAB — APTT: aPTT: 29 seconds (ref 24–37)

## 2011-10-13 MED ORDER — BACITRACIN ZINC 500 UNIT/GM EX OINT
TOPICAL_OINTMENT | CUTANEOUS | Status: AC
  Administered 2011-10-13: 01:00:00
  Filled 2011-10-13: qty 2.7

## 2011-10-13 NOTE — ED Notes (Signed)
At present time, bleeding appears controlled.  Dr. Hyacinth Meeker at bedside.  Pt reports nosebleeds off and on for apx a week.  Denies nausea, vomiting or difficulty breathing at this time.

## 2011-10-13 NOTE — ED Notes (Signed)
Pt still not showing any signs of active bleeding at this time.  Comfort measures provided.  Pt denies any needs at present time.

## 2011-10-13 NOTE — ED Notes (Signed)
Rhinorocket removed by Dr. Hyacinth Meeker.  Bleeding appears to be controlled at this time.  Pt remains sitting in upright position.  Denies dizziness or weakness at this time.

## 2011-10-13 NOTE — ED Notes (Addendum)
Pt continues to have slow bleeding from left nare, rhinorocket in right nare continues to slowly increase in size.

## 2011-10-13 NOTE — ED Provider Notes (Addendum)
History     CSN: [id number] Arrival date & time: 10/13/2011 12:53 AM   First MD Initiated Contact with Patient 10/13/11 0120      Chief Complaint  Patient presents with  . Epistaxis    (Consider location/radiation/quality/duration/timing/severity/associated sxs/prior treatment) HPI Comments: Pt with hx of intermittent nose bleeds over the last week, tonight became much worse and is mostly out of the R nare.  Sx are constant, not improved with afrin by EMS.  He is on ASA daily.  He denies f/c/n/v and has no sob or light headedness.  Sx are severe.  He has no trauma, sneezing, coughing or nose picking.  Patient is a 77 y.o. male presenting with nosebleeds. The history is provided by the patient and the EMS personnel.  Epistaxis     Past Medical History  Diagnosis Date  . Hypertension   . Diabetes mellitus   . Hypothyroidism   . Depression     Past Surgical History  Procedure Date  . Hernia repair   . Transurethral resection of prostate   . Transurethral resection of prostate 09/22/2011    Procedure: TRANSURETHRAL RESECTION OF THE PROSTATE (TURP);  Surgeon: Ky Barban;  Location: [facility];  Service: Urology;  Laterality: N/A;    No family history on file.  History  Substance Use Topics  . Smoking status: Never Smoker   . Smokeless tobacco: Not on file  . Alcohol Use: No      Review of Systems  HENT: Positive for nosebleeds.   All other systems reviewed and are negative.    Allergies  Review of patient's allergies indicates no known allergies.  Home Medications   Current Outpatient Rx  Name Route Sig Dispense Refill  . ACETAMINOPHEN 325 MG PO TABS Oral Take 2 tablets (650 mg total) by mouth every 4 (four) hours as needed for pain or fever. 30 tablet   . ALPRAZOLAM 1 MG PO TABS Oral Take 0.5 tablets (0.5 mg total) by mouth 3 (three) times daily as needed for anxiety. For anxiety 30 tablet   . CEPHALEXIN 500 MG PO CAPS Oral Take 1 capsule (500 mg total)  by mouth 2 (two) times daily. 10 capsule 0  . DILTIAZEM HCL COATED BEADS 300 MG PO CP24 Oral Take 300 mg by mouth daily.      Marland Kitchen DOXYLAMINE SUCCINATE (SLEEP) 25 MG PO TABS Oral Take 25 mg by mouth at bedtime as needed. For sleep     . OMEGA-3 FATTY ACIDS 1000 MG PO CAPS Oral Take 1 capsule (1 g total) by mouth 2 (two) times daily.    Marland Kitchen GLIMEPIRIDE 4 MG PO TABS Oral Take 4 mg by mouth every morning.     Marland Kitchen HYDROCHLOROTHIAZIDE 25 MG PO TABS Oral Take 25 mg by mouth daily.      Marland Kitchen LEVOTHYROXINE SODIUM 50 MCG PO TABS Oral Take 1 tablet (50 mcg total) by mouth daily.    Marland Kitchen NIACIN (ANTIHYPERLIPIDEMIC) 500 MG PO TBCR Oral Take 500 mg by mouth at bedtime.      Marland Kitchen NITROGLYCERIN 0.4 MG SL SUBL Sublingual Place 0.4 mg under the tongue every 5 (five) minutes as needed.      Marland Kitchen OMEPRAZOLE 20 MG PO CPDR Oral Take 1 capsule (20 mg total) by mouth daily. 14 capsule 0  . POTASSIUM CHLORIDE CR 10 MEQ PO CPCR Oral Take 20 mEq by mouth daily.     Marland Kitchen RAMIPRIL 10 MG PO CAPS Oral Take 10 mg by mouth 2 (  two) times daily.      Marland Kitchen TERAZOSIN HCL 10 MG PO CAPS Oral Take 1 capsule (10 mg total) by mouth at bedtime.    . TRAVOPROST (BAK FREE) 0.004 % OP SOLN Both Eyes Place 1 drop into both eyes at bedtime.        BP 137/71  Pulse 85  Temp 97.9 F (36.6 C)  Resp 22  SpO2 100%  Physical Exam  Nursing note and vitals reviewed. Constitutional: He appears well-developed and well-nourished. No distress.  HENT:  Head: Normocephalic and atraumatic.       Bilateral nares appear full of blood clots, the right ear for clots, the left ear with mild bleeding. The oropharynx is clear with a slight amount of blood down the posterior oropharynx.  Eyes: Conjunctivae and EOM are normal. Pupils are equal, round, and reactive to light. Right eye exhibits no discharge. Left eye exhibits no discharge. No scleral icterus.  Neck: Normal range of motion. Neck supple. No JVD present. No thyromegaly present.  Cardiovascular: Normal rate, regular  rhythm, normal heart sounds and intact distal pulses.  Exam reveals no gallop and no friction rub.   No murmur heard. Pulmonary/Chest: Effort normal and breath sounds normal. No respiratory distress. He has no wheezes. He has no rales.  Abdominal: Soft. Bowel sounds are normal. He exhibits no distension and no mass. There is no tenderness.  Musculoskeletal: Normal range of motion. He exhibits no edema and no tenderness.  Lymphadenopathy:    He has no cervical adenopathy.  Neurological: He is alert. Coordination normal.  Skin: Skin is warm and dry. No rash noted. No erythema.  Psychiatric: He has a normal mood and affect. His behavior is normal.    ED Course  EPISTAXIS MANAGEMENT Date/Time: 10/13/2011 2:15 AM Performed by: Eber Hong D Authorized by: Eber Hong D Consent: Verbal consent obtained. Risks and benefits: risks, benefits and alternatives were discussed Consent given by: patient Patient understanding: patient states understanding of the procedure being performed Patient consent: the patient's understanding of the procedure matches consent given Procedure consent: procedure consent matches procedure scheduled Required items: required blood products, implants, devices, and special equipment available Patient identity confirmed: verbally with patient and arm band Time out: Immediately prior to procedure a "time out" was called to verify the correct patient, procedure, equipment, support staff and site/side marked as required. Preparation: Patient was prepped and draped in the usual sterile fashion. Patient sedated: no Treatment site: right posterior Repair method: nasal balloon Post-procedure assessment: bleeding stopped Treatment complexity: simple Patient tolerance: Patient tolerated the procedure well with no immediate complications.   (including critical care time)  Labs Reviewed - No data to display No results found.   1. Epistaxis       MDM  Significant  epistaxis, after evacuation of the right and there may be we with forceps there appears to be heavy bleeding from the mid to posterior there. The source is not visualized. It required nasal packing with rapid Rhino quickly after evacuation to minimize bleeding.   After the inflatable packing was placed, it gradually extruded from the right naris due to the large size of the nostril. I removed the packing and found there to be no active bleeding. I placed bacitracin ointment with a Q-tip into the nare.  They should has tolerated this procedure well and after 1.5 hours of observation has had no recurrence of the bleeding.    Paged ENT for consultation for f/u. - Dr. Pollyann Kennedy agrees to  see in office tomorrow.      Vida Roller, MD 10/13/11 [id number]  Vida Roller, MD 10/13/11 [phone]

## 2011-10-13 NOTE — ED Notes (Signed)
Pt nares suctioned, clots removed and rhino rocket inserted  by Dr. Hyacinth Meeker

## 2011-10-13 NOTE — ED Notes (Signed)
Patient brought in via EMS. Alert and oriented. Patient c/o nose bleed-bilateral nares. Per patient was seen here last night for same reason. Per EMS started bleeding again an hour ago, wife was concerned and wanted pt reevalauted. No active bleeding noted at this time. Pt denies taking any kind of blood thinners.

## 2011-10-13 NOTE — ED Notes (Signed)
Pt arrived via EMS.  Per report, nosebleed began apx 1 hour ago. Afrin given via EMS. Bleeding appears controlled at this time.

## 2011-10-13 NOTE — ED Notes (Signed)
Cleaned patient up, removed and replaced bloody linen, repositioned patient on bed, covered with warm blanket

## 2011-10-13 NOTE — ED Provider Notes (Signed)
History     CSN: [id number] Arrival date & time: 10/13/2011 11:46 AM   First MD Initiated Contact with Patient 10/13/11 1332      Chief Complaint  Patient presents with  . Epistaxis    (Consider location/radiation/quality/duration/timing/severity/associated sxs/prior treatment) HPI Patient was after that he did not have more bleeding the patient was discharged home to followup with Dr. Pollyann Kennedy today. The patient states that seen here with nosebleed last night. Dr. Hyacinth Meeker placed a balloon with came out while in the department. Patient states he has been having intermittent nosebleeds over the past several weeks. He states that he is not on aspirin daily. He has not had this problem in the past. He does not feel weak or lightheaded. He states he thinks this is because he has dry heat at his house. He can normally make it resolved by holding pressure. He had return of nosebleed at 9:15 this morning. He was able to control with pressure and he has not had nosebleeds since that time.  Patient's primary care is Dr. Phillips Odor Past Medical History  Diagnosis Date  . Hypertension   . Diabetes mellitus   . Hypothyroidism   . Depression     Past Surgical History  Procedure Date  . Hernia repair   . Transurethral resection of prostate   . Transurethral resection of prostate 09/22/2011    Procedure: TRANSURETHRAL RESECTION OF THE PROSTATE (TURP);  Surgeon: Ky Barban;  Location: [facility];  Service: Urology;  Laterality: N/A;    No family history on file.  History  Substance Use Topics  . Smoking status: Never Smoker   . Smokeless tobacco: Never Used  . Alcohol Use: No      Review of Systems  All other systems reviewed and are negative.    Allergies  Review of patient's allergies indicates no known allergies.  Home Medications   Current Outpatient Rx  Name Route Sig Dispense Refill  . ALPRAZOLAM 1 MG PO TABS Oral Take 0.5 tablets (0.5 mg total) by mouth 3 (three) times  daily as needed for anxiety. For anxiety 30 tablet   . DOXYLAMINE SUCCINATE (SLEEP) 25 MG PO TABS Oral Take 25 mg by mouth at bedtime as needed. For sleep     . ESCITALOPRAM OXALATE 10 MG PO TABS Oral Take 10 mg by mouth daily.      . OMEGA-3 FATTY ACIDS 1000 MG PO CAPS Oral Take 1 capsule (1 g total) by mouth 2 (two) times daily.    Marland Kitchen GLIMEPIRIDE 4 MG PO TABS Oral Take 4 mg by mouth every morning.     Marland Kitchen LEVOTHYROXINE SODIUM 50 MCG PO TABS Oral Take 1 tablet (50 mcg total) by mouth daily.    Marland Kitchen NIACIN (ANTIHYPERLIPIDEMIC) 500 MG PO TBCR Oral Take 500 mg by mouth at bedtime.      . OMEPRAZOLE 20 MG PO CPDR Oral Take 1 capsule (20 mg total) by mouth daily. 14 capsule 0  . POTASSIUM CHLORIDE 20 MEQ PO PACK Oral Take 20 mEq by mouth daily.      Marland Kitchen RAMIPRIL 10 MG PO CAPS Oral Take 10 mg by mouth 2 (two) times daily.      Marland Kitchen TERAZOSIN HCL 10 MG PO CAPS Oral Take 1 capsule (10 mg total) by mouth at bedtime.    . TRAVOPROST (BAK FREE) 0.004 % OP SOLN Both Eyes Place 1 drop into both eyes at bedtime.      . ACETAMINOPHEN 325 MG PO TABS Oral  Take 2 tablets (650 mg total) by mouth every 4 (four) hours as needed for pain or fever. 30 tablet   . CEPHALEXIN 500 MG PO CAPS Oral Take 1 capsule (500 mg total) by mouth 2 (two) times daily. 10 capsule 0    BP 139/74  Temp(Src) 99.4 F (37.4 C) (Oral)  Resp 20  Ht 6\' 4"  (1.93 m)  Wt 285 lb (129.275 kg)  BMI 34.69 kg/m2  SpO2 98%  Physical Exam  Constitutional: He is oriented to person, place, and time. He appears well-developed and well-nourished.  HENT:  Head: Normocephalic and atraumatic.  Right Ear: External ear normal.  Left Ear: External ear normal.  Mouth/Throat: Oropharynx is clear and moist.       Right nares with dried blood.. Left nares is clear  Neck: Normal range of motion. Neck supple.  Pulmonary/Chest: Effort normal and breath sounds normal.  Abdominal: Soft. Bowel sounds are normal.  Musculoskeletal: Normal range of motion.    Neurological: He is alert and oriented to person, place, and time. He has normal reflexes.  Skin: Skin is warm and dry.  Psychiatric: He has a normal mood and affect.    ED Course  Procedures (including critical care time)  Labs Reviewed - No data to display No results found.   No diagnosis found.    MDM  Hemoglobin was 9.6 on 10/18.12- has remained stable.         Date: 10/13/2011  Rate: 94  Rhythm: nsr with sinus pauses and pvcs  QRS Axis: left  Intervals: normal  ST/T Wave abnormalities: nonspecific ST/T changes  Conduction Disutrbances:first-degree A-V block   Narrative Interpretation:   Old EKG Reviewed: changes noted    Hilario Quarry, MD 10/13/11 [phone]

## 2011-10-16 ENCOUNTER — Encounter (HOSPITAL_COMMUNITY): Payer: Self-pay | Admitting: Emergency Medicine

## 2011-10-16 ENCOUNTER — Observation Stay (HOSPITAL_COMMUNITY)
Admission: EM | Admit: 2011-10-16 | Discharge: 2011-10-17 | Disposition: A | Discharge status: Home / Self Care | Payer: Medicare FFS | Source: Ambulatory Visit | Attending: Emergency Medicine | Admitting: Emergency Medicine

## 2011-10-16 DIAGNOSIS — F329 Major depressive disorder, single episode, unspecified: Secondary | ICD-10-CM | POA: Insufficient documentation

## 2011-10-16 DIAGNOSIS — E039 Hypothyroidism, unspecified: Secondary | ICD-10-CM | POA: Insufficient documentation

## 2011-10-16 DIAGNOSIS — I129 Hypertensive chronic kidney disease with stage 1 through stage 4 chronic kidney disease, or unspecified chronic kidney disease: Secondary | ICD-10-CM | POA: Insufficient documentation

## 2011-10-16 DIAGNOSIS — N184 Chronic kidney disease, stage 4 (severe): Secondary | ICD-10-CM | POA: Insufficient documentation

## 2011-10-16 DIAGNOSIS — F3289 Other specified depressive episodes: Secondary | ICD-10-CM | POA: Insufficient documentation

## 2011-10-16 DIAGNOSIS — R040 Epistaxis: Secondary | ICD-10-CM

## 2011-10-16 DIAGNOSIS — G4700 Insomnia, unspecified: Secondary | ICD-10-CM | POA: Insufficient documentation

## 2011-10-16 DIAGNOSIS — D649 Anemia, unspecified: Secondary | ICD-10-CM | POA: Diagnosis present

## 2011-10-16 DIAGNOSIS — E119 Type 2 diabetes mellitus without complications: Secondary | ICD-10-CM | POA: Insufficient documentation

## 2011-10-16 DIAGNOSIS — E785 Hyperlipidemia, unspecified: Secondary | ICD-10-CM | POA: Insufficient documentation

## 2011-10-16 DIAGNOSIS — E1122 Type 2 diabetes mellitus with diabetic chronic kidney disease: Secondary | ICD-10-CM | POA: Diagnosis present

## 2011-10-16 DIAGNOSIS — N400 Enlarged prostate without lower urinary tract symptoms: Secondary | ICD-10-CM | POA: Diagnosis present

## 2011-10-16 DIAGNOSIS — D62 Acute posthemorrhagic anemia: Secondary | ICD-10-CM | POA: Insufficient documentation

## 2011-10-16 DIAGNOSIS — K219 Gastro-esophageal reflux disease without esophagitis: Secondary | ICD-10-CM | POA: Insufficient documentation

## 2011-10-16 DIAGNOSIS — F411 Generalized anxiety disorder: Secondary | ICD-10-CM | POA: Insufficient documentation

## 2011-10-16 DIAGNOSIS — I10 Essential (primary) hypertension: Secondary | ICD-10-CM | POA: Diagnosis present

## 2011-10-16 LAB — POCT I-STAT, CHEM 8
Calcium, Ion: 1.1 mmol/L — ABNORMAL LOW (ref 1.12–1.32)
Chloride: 104 mEq/L (ref 96–112)
HCT: 25 % — ABNORMAL LOW (ref 39.0–52.0)
Sodium: 142 mEq/L (ref 135–145)
TCO2: 27 mmol/L (ref 0–100)

## 2011-10-16 LAB — CBC
Hemoglobin: 8 g/dL — ABNORMAL LOW (ref 13.0–17.0)
MCHC: 32.4 g/dL (ref 30.0–36.0)
RBC: 2.83 MIL/uL — ABNORMAL LOW (ref 4.22–5.81)
WBC: 5.2 10*3/uL (ref 4.0–10.5)

## 2011-10-16 LAB — DIFFERENTIAL
Basophils Relative: 1 % (ref 0–1)
Lymphocytes Relative: 31 % (ref 12–46)
Monocytes Relative: 7 % (ref 3–12)
Neutro Abs: 2.9 10*3/uL (ref 1.7–7.7)
Neutrophils Relative %: 56 % (ref 43–77)

## 2011-10-16 LAB — GLUCOSE, CAPILLARY: Glucose-Capillary: 146 mg/dL — ABNORMAL HIGH (ref 70–99)

## 2011-10-16 LAB — APTT: aPTT: 29 seconds (ref 24–37)

## 2011-10-16 LAB — PROTIME-INR: INR: 1.14 (ref 0.00–1.49)

## 2011-10-16 MED ORDER — SODIUM CHLORIDE 0.9 % IV SOLN
250.0000 mL | INTRAVENOUS | Status: DC
  Administered 2011-10-16: 250 mL via INTRAVENOUS

## 2011-10-16 MED ORDER — POTASSIUM CHLORIDE CRYS ER 20 MEQ PO TBCR
20.0000 meq | EXTENDED_RELEASE_TABLET | Freq: Every day | ORAL | Status: DC
  Administered 2011-10-16 – 2011-10-17 (×2): 20 meq via ORAL
  Filled 2011-10-16: qty 1
  Filled 2011-10-16: qty 2

## 2011-10-16 MED ORDER — SODIUM CHLORIDE 0.9 % IJ SOLN: 3.0000 mL | INTRAMUSCULAR | Status: DC | PRN

## 2011-10-16 MED ORDER — SENNA 8.6 MG PO TABS
2.0000 | ORAL_TABLET | Freq: Every day | ORAL | Status: DC | PRN
  Filled 2011-10-16: qty 2

## 2011-10-16 MED ORDER — CIPROFLOXACIN HCL 500 MG PO TABS
500.0000 mg | ORAL_TABLET | Freq: Once | ORAL | Status: AC
  Administered 2011-10-16: 500 mg via ORAL
  Filled 2011-10-16: qty 1

## 2011-10-16 MED ORDER — ACETAMINOPHEN 325 MG PO TABS: 650.0000 mg | ORAL_TABLET | Freq: Four times a day (QID) | ORAL | Status: DC | PRN

## 2011-10-16 MED ORDER — ALPRAZOLAM 0.5 MG PO TABS: 0.5000 mg | ORAL_TABLET | Freq: Three times a day (TID) | ORAL | Status: DC | PRN

## 2011-10-16 MED ORDER — OMEGA-3-ACID ETHYL ESTERS 1 G PO CAPS
1.0000 g | ORAL_CAPSULE | Freq: Two times a day (BID) | ORAL | Status: DC
  Administered 2011-10-16 – 2011-10-17 (×3): 1 g via ORAL
  Filled 2011-10-16 (×5): qty 1

## 2011-10-16 MED ORDER — TERAZOSIN HCL 5 MG PO CAPS: 10.0000 mg | ORAL_CAPSULE | Freq: Every day | ORAL | Status: DC

## 2011-10-16 MED ORDER — TERAZOSIN HCL 5 MG PO CAPS
10.0000 mg | ORAL_CAPSULE | Freq: Every day | ORAL | Status: DC
  Administered 2011-10-16: 10 mg via ORAL
  Filled 2011-10-16 (×3): qty 2

## 2011-10-16 MED ORDER — OMEGA-3 FATTY ACIDS 1000 MG PO CAPS: 1.0000 g | ORAL_CAPSULE | Freq: Two times a day (BID) | ORAL | Status: DC

## 2011-10-16 MED ORDER — NIACIN ER (ANTIHYPERLIPIDEMIC) 500 MG PO TBCR
500.0000 mg | EXTENDED_RELEASE_TABLET | Freq: Every day | ORAL | Status: DC
  Administered 2011-10-16: 500 mg via ORAL
  Filled 2011-10-16 (×3): qty 1

## 2011-10-16 MED ORDER — FUROSEMIDE 10 MG/ML IJ SOLN
20.0000 mg | Freq: Once | INTRAMUSCULAR | Status: AC
  Administered 2011-10-16: 20 mg via INTRAVENOUS

## 2011-10-16 MED ORDER — OXYMETAZOLINE HCL 0.05 % NA SOLN
NASAL | Status: AC
  Administered 2011-10-16: 2 via NASAL
  Filled 2011-10-16: qty 15

## 2011-10-16 MED ORDER — FUROSEMIDE 10 MG/ML IJ SOLN
INTRAMUSCULAR | Status: AC
  Administered 2011-10-16: 20 mg via INTRAVENOUS
  Filled 2011-10-16: qty 4

## 2011-10-16 MED ORDER — SODIUM CHLORIDE 0.9 % IJ SOLN
3.0000 mL | Freq: Two times a day (BID) | INTRAMUSCULAR | Status: DC
  Administered 2011-10-16: 3 mL via INTRAVENOUS

## 2011-10-16 MED ORDER — CEPHALEXIN 500 MG PO CAPS
500.0000 mg | ORAL_CAPSULE | Freq: Two times a day (BID) | ORAL | Status: DC
  Administered 2011-10-16 – 2011-10-17 (×3): 500 mg via ORAL
  Filled 2011-10-16 (×5): qty 1

## 2011-10-16 MED ORDER — ZOLPIDEM TARTRATE 5 MG PO TABS: 5.0000 mg | ORAL_TABLET | Freq: Every evening | ORAL | Status: DC | PRN

## 2011-10-16 MED ORDER — PANTOPRAZOLE SODIUM 40 MG PO TBEC
40.0000 mg | DELAYED_RELEASE_TABLET | Freq: Every day | ORAL | Status: DC
  Administered 2011-10-16: 40 mg via ORAL
  Filled 2011-10-16: qty 1

## 2011-10-16 MED ORDER — SODIUM CHLORIDE 0.9 % IV BOLUS (SEPSIS)
1000.0000 mL | Freq: Once | INTRAVENOUS | Status: AC
  Administered 2011-10-16: 1000 mL via INTRAVENOUS

## 2011-10-16 MED ORDER — ACETAMINOPHEN 650 MG RE SUPP: 650.0000 mg | Freq: Four times a day (QID) | RECTAL | Status: DC | PRN

## 2011-10-16 MED ORDER — TRAVOPROST (BAK FREE) 0.004 % OP SOLN
1.0000 [drp] | Freq: Every day | OPHTHALMIC | Status: DC
  Administered 2011-10-16: 1 [drp] via OPHTHALMIC
  Filled 2011-10-16 (×2): qty 2.5

## 2011-10-16 MED ORDER — POTASSIUM CHLORIDE 20 MEQ PO PACK
20.0000 meq | PACK | Freq: Every day | ORAL | Status: DC
  Filled 2011-10-16: qty 1

## 2011-10-16 MED ORDER — GLIMEPIRIDE 4 MG PO TABS
4.0000 mg | ORAL_TABLET | ORAL | Status: DC
  Administered 2011-10-16 – 2011-10-17 (×2): 4 mg via ORAL
  Filled 2011-10-16 (×4): qty 1

## 2011-10-16 MED ORDER — RAMIPRIL 10 MG PO CAPS
10.0000 mg | ORAL_CAPSULE | Freq: Two times a day (BID) | ORAL | Status: DC
  Administered 2011-10-16 – 2011-10-17 (×3): 10 mg via ORAL
  Filled 2011-10-16 (×5): qty 1

## 2011-10-16 MED ORDER — LEVOTHYROXINE SODIUM 50 MCG PO TABS
50.0000 ug | ORAL_TABLET | Freq: Every day | ORAL | Status: DC
  Administered 2011-10-16 – 2011-10-17 (×2): 50 ug via ORAL
  Filled 2011-10-16 (×2): qty 1

## 2011-10-16 NOTE — Progress Notes (Signed)
Utilization Review Completed.  Garrett Bradley  10/16/2011 

## 2011-10-16 NOTE — Progress Notes (Signed)
Patient now with epitaxis from rt nare despite balloon packing device intact to right nare, Dr. Gwenlyn Perking notified, direct pressure held x 20 minutes with decrease in bleeding, patient upright sitting on side of the bed, Dr. Gwenlyn Perking to call ENT MD, vitals remain stable, receiving second unit of packed red blood cells, Berle Mull

## 2011-10-16 NOTE — ED Notes (Signed)
Patient is resting comfortably. Family at bedside. Pt waiting for bed placement.

## 2011-10-16 NOTE — ED Notes (Signed)
PT. REPORTS NASAL BLEEDING ONSET THIS EVENING DENIES INJURY , ALSO REPORTS REPORTS BLED 2 DAYS AGO.

## 2011-10-16 NOTE — ED Provider Notes (Signed)
Medical screening examination/treatment/procedure(s) were conducted as a shared visit with non-physician practitioner(s) and myself.  I personally evaluated the patient during the encounter  Patient has been seen multiple times in the past few days for epistaxis. Had a Rhino Rocket placed and removed however did not followup with ENT. Presents today with re\re bleeding. He has no associated complaints.  Brisk bleeding from his right nares. There is small amount of bleeding from his left ear. I placed a rapid rhino inflated with approximately 13 cc of air. Bleeding stopped and remained stopped on multiple reassessment. I placed him Afrin in his left nare with resolution of his bleeding.  Laboratories performed and he was found have dropped 2 g of hemoglobin in the past 2 days. Given this he'll be admitted to the hospital for further observation. ENT was counseled however given the stoppage of bleeding he stated that if the bleeding restarted patient be consulted by the hospitalist.  Dayton Bailiff, MD 10/16/11 [phone]

## 2011-10-16 NOTE — Consult Note (Signed)
Reason for Consult: epistaxix Referring Physician: Starr Engel is an 77 y.o. male.  HPI: One month history of recurrent anterior right epistaxis, has been packed on a couple of occassions. He was supposed to see me in the office a few days ago but was unable to get to our office. No prior nasal history. He is not on any blood thinners and is on no nasal sprays.  Past Medical History  Diagnosis Date  . Hypertension   . Diabetes mellitus   . Hypothyroidism   . Depression     Past Surgical History  Procedure Date  . Hernia repair   . Transurethral resection of prostate   . Transurethral resection of prostate 09/22/2011    Procedure: TRANSURETHRAL RESECTION OF THE PROSTATE (TURP);  Surgeon: Ky Barban;  Location: [facility];  Service: Urology;  Laterality: N/A;    History reviewed. No pertinent family history.  Social History:  reports that he has never smoked. He has never used smokeless tobacco. He reports that he does not drink alcohol or use illicit drugs.  Allergies: No Known Allergies  Medications: I have reviewed the patient's current medications.  Results for orders placed during the hospital encounter of 10/16/11 (from the past 48 hour(s))  CBC     Status: Abnormal   Collection Time   10/16/11  1:37 AM      Component Value Range Comment   WBC 5.2  4.0 - 10.5 (K/uL)    RBC 2.83 (*) 4.22 - 5.81 (MIL/uL)    Hemoglobin 8.0 (*) 13.0 - 17.0 (g/dL)    HCT 47.8 (*) 29.5 - 52.0 (%)    MCV 87.3  78.0 - 100.0 (fL)    MCH 28.3  26.0 - 34.0 (pg)    MCHC 32.4  30.0 - 36.0 (g/dL)    RDW 62.1 (*) 30.8 - 15.5 (%)    Platelets 161  150 - 400 (K/uL)   DIFFERENTIAL     Status: Normal   Collection Time   10/16/11  1:37 AM      Component Value Range Comment   Neutrophils Relative 56  43 - 77 (%)    Neutro Abs 2.9  1.7 - 7.7 (K/uL)    Lymphocytes Relative 31  12 - 46 (%)    Lymphs Abs 1.6  0.7 - 4.0 (K/uL)    Monocytes Relative 7  3 - 12 (%)    Monocytes Absolute 0.4   0.1 - 1.0 (K/uL)    Eosinophils Relative 5  0 - 5 (%)    Eosinophils Absolute 0.3  0.0 - 0.7 (K/uL)    Basophils Relative 1  0 - 1 (%)    Basophils Absolute 0.0  0.0 - 0.1 (K/uL)   PROTIME-INR     Status: Normal   Collection Time   10/16/11  1:37 AM      Component Value Range Comment   Prothrombin Time 14.8  11.6 - 15.2 (seconds)    INR 1.14  0.00 - 1.49    APTT     Status: Normal   Collection Time   10/16/11  1:37 AM      Component Value Range Comment   aPTT 29  24 - 37 (seconds)   POCT I-STAT, CHEM 8     Status: Abnormal   Collection Time   10/16/11  2:04 AM      Component Value Range Comment   Sodium 142  135 - 145 (mEq/L)    Potassium 4.1  3.5 - 5.1 (mEq/L)    Chloride 104  96 - 112 (mEq/L)    BUN 17  6 - 23 (mg/dL)    Creatinine, Ser 9.56  0.50 - 1.35 (mg/dL)    Glucose, Bld 213 (*) 70 - 99 (mg/dL)    Calcium, Ion 0.86 (*) 1.12 - 1.32 (mmol/L)    TCO2 27  0 - 100 (mmol/L)    Hemoglobin 8.5 (*) 13.0 - 17.0 (g/dL)    HCT 57.8 (*) 46.9 - 52.0 (%)   TYPE AND SCREEN     Status: Normal (Preliminary result)   Collection Time   10/16/11  4:00 AM      Component Value Range Comment   ABO/RH(D) O POS      Antibody Screen NEG      Sample Expiration 10/19/2011      Unit Number [id number]      Blood Component Type RED CELLS,LR      Unit division 00      Status of Unit ISSUED      Transfusion Status OK TO TRANSFUSE      Crossmatch Result Compatible      Unit Number [id number]      Blood Component Type RED CELLS,LR      Unit division 00      Status of Unit ISSUED      Transfusion Status OK TO TRANSFUSE      Crossmatch Result Compatible     ABO/RH     Status: Normal   Collection Time   10/16/11  4:00 AM      Component Value Range Comment   ABO/RH(D) O POS     HEMOGLOBIN AND HEMATOCRIT, BLOOD     Status: Abnormal   Collection Time   10/16/11  8:14 AM      Component Value Range Comment   Hemoglobin 7.4 (*) 13.0 - 17.0 (g/dL)    HCT 53.6 (*) 64.4 - 52.0 (%)   GLUCOSE, CAPILLARY      Status: Abnormal   Collection Time   10/16/11  8:34 AM      Component Value Range Comment   Glucose-Capillary 146 (*) 70 - 99 (mg/dL)    Comment 1 Documented in Chart      Comment 2 Notify RN     PREPARE RBC (CROSSMATCH)     Status: Normal   Collection Time   10/16/11 11:30 AM      Component Value Range Comment   Order Confirmation ORDER PROCESSED BY BLOOD BANK       No results found.  ROS: Otherwise negative  Blood pressure 163/91, pulse 105, temperature 99 F (37.2 C), temperature source Oral, resp. rate 20, height 6\' 4"  (1.93 m), weight 126.4 kg (278 lb 10.6 oz), SpO2 98.00%.  PHYSICAL EXAM: Overall appearance:  Healthy appearing, in no distress Head:  Normocephalic, atraumatic. Ears: External ears normal. Nose: External nose is healthy in appearance. Internal nasal exam reveals leftward nasal septal deviation. There is an inflatable pack on the right side. This was removed and the right nasal cavity was anesthetized with afrin/xylocqine topically. Careful inspection of the right nasal cavity revealed a small bleeding site in the anterior/inferior nasal septum which bled with manipulation with a Frazier suction. This waxs cauterized with silver nitrate.No further bleeding occurred. A slimline Merocel pack was placed and inflated with the afrin/xylocaine solution. He tolerated this very well. Oral Cavity:  There are no mucosal lesions or masses identified. Upper denture in place. Neuro:  No identifiable neurologic  deficits. Neck: No palpable neck masses.  Studies Reviewed: none  Assessment/Plan: Epistaxis, treated with cautery and packing. Plan to maintain the packing for 5 days. I will see again if any further bleeding occurs. He may resume a normal diet. If he does well and can be discharged, i can see him in the office in 5 days to remove the packing. I will recheck him in the morning or sooner prn any further bleeding.  Davette Nugent H 10/16/2011, 6:46 PM

## 2011-10-16 NOTE — Progress Notes (Signed)
Subjective: Feeling tire, frustrated and with generalized weakness. No fever, no cp, no N/V.  Objective: Vital signs in last 24 hours: Temp:  [97.8 F (36.6 C)-99.7 F (37.6 C)] 99.7 F (37.6 C) (11/09 0650) Pulse Rate:  [85-109] 109  (11/09 0650) Resp:  [18-20] 20  (11/09 0650) BP: (145-176)/(55-100) 155/71 mmHg (11/09 0650) SpO2:  [94 %-100 %] 100 % (11/09 0650) Weight:  [126.4 kg (278 lb 10.6 oz)] 278 lb 10.6 oz (126.4 kg) (11/09 0650) Weight change:    Physical Exam: General: Alert, awake, oriented x3, in mild distress. HEENT: No bruits, no goiter. Right nostril with packing in place; no bleeding seen  at this point. Heart: Mild tachycardia, without murmurs, rubs, gallops. Lungs: Clear to auscultation bilaterally. Abdomen: Soft, nontender, nondistended, positive bowel sounds. Extremities: No clubbing cyanosis or edema with positive pedal pulses. Neuro: Grossly intact, nonfocal.    Lab Results: Basic Metabolic Panel:  Basename 10/16/11 0204 10/13/11 1436  NA 142 142  K 4.1 3.9  CL 104 103  CO2 -- --  GLUCOSE 113* 98  BUN 17 28*  CREATININE 1.20 1.30  CALCIUM -- --  MG -- --  PHOS -- --   CBC:  Basename 10/16/11 0814 10/16/11 0204 10/16/11 0137  WBC -- -- 5.2  NEUTROABS -- -- 2.9  HGB 7.4* 8.5* --  HCT 23.0* 25.0* --  MCV -- -- 87.3  PLT -- -- 161   Coagulation:  Basename 10/16/11 0137 10/13/11 1352  LABPROT 14.8 14.8  INR 1.14 1.14   Medications: Scheduled Meds:   . ciprofloxacin  500 mg Oral Once  . furosemide  20 mg Intravenous Once  . oxymetazoline      . sodium chloride  1,000 mL Intravenous Once  . sodium chloride  3 mL Intravenous Q12H   Continuous Infusions:   . sodium chloride 250 mL (10/16/11 1010)   PRN Meds:.acetaminophen, acetaminophen, senna, sodium chloride  Assessment/Plan: 1-Epixtasis: Currently with right nostril packed; no bleeding appreciated. Per ENT he can follow as an outpatient if no further bleeding appreciated in 24  hours. Will continue cephalexin. 2-ABLA: Will transfuse 2 units of PRBC's and follow Hgb. Secondary to problem #1 3-DM: continue Amaryl. 4-HTN: continue Ramipril and hytrin 5-Dyslipidemia: Continue fish oil and niacin. 6-Hypothyroidism:continue synthroid. 7-GERD: continue PPI. 8-Anxiety: will continue PRN xanax. 9-Insomnia: will use prn zolpidem. 10-DVT: SCD's.   LOS: 0 days   Leonard Feigel 10/16/2011, 10:56 AM

## 2011-10-16 NOTE — ED Notes (Signed)
Pt. Placed on monitor, pulse ox and BP cuff 

## 2011-10-16 NOTE — Plan of Care (Signed)
Problem: Consults Goal: General Medical Patient Education See Patient Education Module for specific education. Outcome: Completed/Met Date Met:  10/16/11 epistaxis

## 2011-10-16 NOTE — H&P (Signed)
Garrett Bradley is an 77 y.o. male.   Chief Complaint: Epistaxis HPI: this pt was at [facility] last month where he had a prostate surgery and shortly after that developed what the wife calls " infection in his blood".  The pt was very sick for a while a spent a few days in the hospital and after discharge developed epistaxis.  He relates that to his acute illness.  The epistaxis has ben going on and off for about two weeks and he was supposed to see ENT as an outpt on Nov 4th but did not make it to his appt and now is here for recurrent epistaxis No other complaints  Past Medical History  Diagnosis Date  . Hypertension   . Diabetes mellitus   . Hypothyroidism   . Depression     Past Surgical History  Procedure Date  . Hernia repair   . Transurethral resection of prostate   . Transurethral resection of prostate 09/22/2011    Procedure: TRANSURETHRAL RESECTION OF THE PROSTATE (TURP);  Surgeon: Ky Barban;  Location: [facility];  Service: Urology;  Laterality: N/A;    History reviewed. No pertinent family history. Social History:  reports that he has never smoked. He has never used smokeless tobacco. He reports that he does not drink alcohol or use illicit drugs.  Allergies: No Known Allergies  Medications Prior to Admission  Medication Dose Route Frequency Provider Last Rate Last Dose  . ciprofloxacin (CIPRO) tablet 500 mg  500 mg Oral Once Dayton Bailiff, MD   500 mg at 10/16/11 0211  . oxymetazoline (AFRIN) 0.05 % nasal spray        2 spray at 10/16/11 0208  . sodium chloride 0.9 % bolus 1,000 mL  1,000 mL Intravenous Once Dayton Bailiff, MD   1,000 mL at 10/16/11 0330   Medications Prior to Admission  Medication Sig Dispense Refill  . ALPRAZolam (XANAX) 1 MG tablet Take 0.5 tablets (0.5 mg total) by mouth 3 (three) times daily as needed for anxiety. For anxiety  30 tablet    . cephALEXin (KEFLEX) 500 MG capsule Take 1 capsule (500 mg total) by mouth 2 (two) times daily.  10 capsule   0  . doxylamine, Sleep, (UNISOM) 25 MG tablet Take 25 mg by mouth at bedtime as needed. For sleep       . fish oil-omega-3 fatty acids 1000 MG capsule Take 1 capsule (1 g total) by mouth 2 (two) times daily.      Marland Kitchen glimepiride (AMARYL) 4 MG tablet Take 4 mg by mouth every morning.       Marland Kitchen levothyroxine (SYNTHROID, LEVOTHROID) 50 MCG tablet Take 1 tablet (50 mcg total) by mouth daily.      . niacin (NIASPAN) 500 MG CR tablet Take 500 mg by mouth at bedtime.        Marland Kitchen omeprazole (PRILOSEC) 20 MG capsule Take 1 capsule (20 mg total) by mouth daily.  14 capsule  0  . potassium chloride (KLOR-CON) 20 MEQ packet Take 20 mEq by mouth daily.        . ramipril (ALTACE) 10 MG capsule Take 10 mg by mouth 2 (two) times daily.        Marland Kitchen terazosin (HYTRIN) 10 MG capsule Take 1 capsule (10 mg total) by mouth at bedtime.      . Travoprost, BAK Free, (TRAVATAMN) 0.004 % SOLN ophthalmic solution Place 1 drop into both eyes at bedtime.  Results for orders placed during the hospital encounter of 10/16/11 (from the past 48 hour(s))  CBC     Status: Abnormal   Collection Time   10/16/11  1:37 AM      Component Value Range Comment   WBC 5.2  4.0 - 10.5 (K/uL)    RBC 2.83 (*) 4.22 - 5.81 (MIL/uL)    Hemoglobin 8.0 (*) 13.0 - 17.0 (g/dL)    HCT 16.1 (*) 09.6 - 52.0 (%)    MCV 87.3  78.0 - 100.0 (fL)    MCH 28.3  26.0 - 34.0 (pg)    MCHC 32.4  30.0 - 36.0 (g/dL)    RDW 04.5 (*) 40.9 - 15.5 (%)    Platelets 161  150 - 400 (K/uL)   DIFFERENTIAL     Status: Normal   Collection Time   10/16/11  1:37 AM      Component Value Range Comment   Neutrophils Relative 56  43 - 77 (%)    Neutro Abs 2.9  1.7 - 7.7 (K/uL)    Lymphocytes Relative 31  12 - 46 (%)    Lymphs Abs 1.6  0.7 - 4.0 (K/uL)    Monocytes Relative 7  3 - 12 (%)    Monocytes Absolute 0.4  0.1 - 1.0 (K/uL)    Eosinophils Relative 5  0 - 5 (%)    Eosinophils Absolute 0.3  0.0 - 0.7 (K/uL)    Basophils Relative 1  0 - 1 (%)    Basophils Absolute  0.0  0.0 - 0.1 (K/uL)   PROTIME-INR     Status: Normal   Collection Time   10/16/11  1:37 AM      Component Value Range Comment   Prothrombin Time 14.8  11.6 - 15.2 (seconds)    INR 1.14  0.00 - 1.49    APTT     Status: Normal   Collection Time   10/16/11  1:37 AM      Component Value Range Comment   aPTT 29  24 - 37 (seconds)   POCT I-STAT, CHEM 8     Status: Abnormal   Collection Time   10/16/11  2:04 AM      Component Value Range Comment   Sodium 142  135 - 145 (mEq/L)    Potassium 4.1  3.5 - 5.1 (mEq/L)    Chloride 104  96 - 112 (mEq/L)    BUN 17  6 - 23 (mg/dL)    Creatinine, Ser 8.11  0.50 - 1.35 (mg/dL)    Glucose, Bld 914 (*) 70 - 99 (mg/dL)    Calcium, Ion 7.82 (*) 1.12 - 1.32 (mmol/L)    TCO2 27  0 - 100 (mmol/L)    Hemoglobin 8.5 (*) 13.0 - 17.0 (g/dL)    HCT 95.6 (*) 21.3 - 52.0 (%)    No results found.  Review of Systems  Constitutional: Negative.   HENT: Positive for nosebleeds.   Eyes: Negative.   Respiratory: Negative.   Cardiovascular: Negative.   Gastrointestinal: Negative.   Genitourinary: Negative.   Musculoskeletal: Negative.   Skin: Negative.   Neurological: Negative.   Endo/Heme/Allergies: Negative.   Psychiatric/Behavioral: Negative.     Blood pressure 145/55, pulse 95, temperature 97.8 F (36.6 C), temperature source Oral, resp. rate 18, SpO2 94.00%. Physical Exam  Vitals reviewed. Constitutional: He is oriented to person, place, and time. He appears well-developed and well-nourished.  HENT:  Head: Normocephalic and atraumatic.  Nosebleed from the R nostril  Eyes: Conjunctivae and EOM are normal. Pupils are equal, round, and reactive to light. Right eye exhibits no discharge. Left eye exhibits no discharge. No scleral icterus.  Neck: Normal range of motion. Neck supple. No JVD present. No tracheal deviation present. No thyromegaly present.  Cardiovascular: Normal rate, regular rhythm, normal heart sounds and intact distal pulses.  Exam  reveals no gallop and no friction rub.   No murmur heard. Respiratory: Effort normal and breath sounds normal. No respiratory distress. He has no wheezes. He has no rales. He exhibits no tenderness.  GI: Soft. Bowel sounds are normal. He exhibits no distension and no mass. There is no tenderness. There is no rebound and no guarding.  Musculoskeletal: Normal range of motion.  Lymphadenopathy:    He has no cervical adenopathy.  Neurological: He is alert and oriented to person, place, and time. No cranial nerve deficit. Coordination normal.  Skin: Skin is warm and dry. No rash noted. No erythema. No pallor.  Psychiatric: He has a normal mood and affect. His behavior is normal.     Assessment/Plan 1.  Epistaxis from R nostril s/p packing in the ER and subsequent anemia- the pt will be under observation.  He will get hemoglobin check in a few hours and if his H&H drops he will be given blood, he has already been typed and screened and ENT has been contacted.  They said that the pt will be seen in the hospital only if the bleeding reoccurs and that he can be seen as an outpt in a few days.  The pt and his wife and are agreeable to this  Melene Plan 10/16/2011, 4:35 AM

## 2011-10-16 NOTE — ED Provider Notes (Signed)
History     CSN: [id number] Arrival date & time: 10/16/2011 12:12 AM   First MD Initiated Contact with Patient 10/16/11 0053      Chief Complaint  Patient presents with  . Epistaxis    HPI: Patient is a 77 y.o. male presenting with nosebleeds. The history is provided by the patient. No language interpreter was used.  Epistaxis  This is a recurrent problem. The current episode started more than 2 days ago. The problem occurs every several days. The problem has been gradually worsening. The problem is associated with an unknown factor. The bleeding has been from both nares. He has tried applying pressure, ice and a nasal tampon for the symptoms. The treatment provided moderate relief. His past medical history does not include sinus problems or nose-picking.  Reports he has had intermittent nosebleeds since d/c from hospital in late Oct s/p prostrate surgery. Much more frequent this week. Seen 2 times at [facility] on Wednesday, nasal tampon placed which ultimately came out. Nosebleed seemed to resolve. Then tonight at approx 2130 had nosebleed that lasted approx 30 minutes and he was able to get it stopped. Has not started bleeding again since arrival to hospital. Denies pain, or other symptoms.  Past Medical History  Diagnosis Date  . Hypertension   . Diabetes mellitus   . Hypothyroidism   . Depression     Past Surgical History  Procedure Date  . Hernia repair   . Transurethral resection of prostate   . Transurethral resection of prostate 09/22/2011    Procedure: TRANSURETHRAL RESECTION OF THE PROSTATE (TURP);  Surgeon: Ky Barban;  Location: [facility];  Service: Urology;  Laterality: N/A;    History reviewed. No pertinent family history.  History  Substance Use Topics  . Smoking status: Never Smoker   . Smokeless tobacco: Never Used  . Alcohol Use: No      Review of Systems  Constitutional: Negative.   HENT: Positive for nosebleeds.   Eyes: Negative.   Respiratory:  Negative.   Cardiovascular: Negative.   Gastrointestinal: Negative.   Genitourinary: Negative.   Musculoskeletal: Negative.   Skin: Negative.   Neurological: Negative.   Hematological: Negative.   Psychiatric/Behavioral: Negative.     Allergies  Review of patient's allergies indicates no known allergies.  Home Medications   Current Outpatient Rx  Name Route Sig Dispense Refill  . ALPRAZOLAM 1 MG PO TABS Oral Take 0.5 tablets (0.5 mg total) by mouth 3 (three) times daily as needed for anxiety. For anxiety 30 tablet   . CEPHALEXIN 500 MG PO CAPS Oral Take 1 capsule (500 mg total) by mouth 2 (two) times daily. 10 capsule 0  . DOXYLAMINE SUCCINATE (SLEEP) 25 MG PO TABS Oral Take 25 mg by mouth at bedtime as needed. For sleep     . OMEGA-3 FATTY ACIDS 1000 MG PO CAPS Oral Take 1 capsule (1 g total) by mouth 2 (two) times daily.    Marland Kitchen GLIMEPIRIDE 4 MG PO TABS Oral Take 4 mg by mouth every morning.     Marland Kitchen LEVOTHYROXINE SODIUM 50 MCG PO TABS Oral Take 1 tablet (50 mcg total) by mouth daily.    Marland Kitchen NIACIN (ANTIHYPERLIPIDEMIC) 500 MG PO TBCR Oral Take 500 mg by mouth at bedtime.      . OMEPRAZOLE 20 MG PO CPDR Oral Take 1 capsule (20 mg total) by mouth daily. 14 capsule 0  . POTASSIUM CHLORIDE 20 MEQ PO PACK Oral Take 20 mEq by mouth daily.      Marland Kitchen  RAMIPRIL 10 MG PO CAPS Oral Take 10 mg by mouth 2 (two) times daily.      Marland Kitchen TERAZOSIN HCL 10 MG PO CAPS Oral Take 1 capsule (10 mg total) by mouth at bedtime.    . TRAVOPROST (BAK FREE) 0.004 % OP SOLN Both Eyes Place 1 drop into both eyes at bedtime.        BP 176/100  Pulse 98  Temp(Src) 97.8 F (36.6 C) (Oral)  Resp 18  SpO2 100%  Physical Exam  Constitutional: He is oriented to person, place, and time. He appears well-developed and well-nourished.  HENT:  Head: Normocephalic and atraumatic.       Residual blood noted to bil nares  Eyes: Conjunctivae are normal.  Neck: Neck supple.  Cardiovascular: Normal rate and regular rhythm.     Pulmonary/Chest: Effort normal and breath sounds normal.  Musculoskeletal: Normal range of motion.  Neurological: He is alert and oriented to person, place, and time.  Skin: Skin is warm and dry.  Psychiatric: He has a normal mood and affect.    ED Course  Procedures Persistent nosebleeds. Seen at [facility] 2 times on 10/13/2011. Will repeat i-stat 8 and re-evaluate.   Labs Reviewed  CBC - Abnormal; Notable for the following:    RBC 2.83 (*)    Hemoglobin 8.0 (*)    HCT 24.7 (*)    RDW 15.7 (*)    All other components within normal limits  POCT I-STAT, CHEM 8 - Abnormal; Notable for the following:    Glucose, Bld 113 (*)    Calcium, Ion 1.10 (*)    Hemoglobin 8.5 (*)    HCT 25.0 (*)    All other components within normal limits  DIFFERENTIAL  PROTIME-INR  APTT  TYPE AND SCREEN  ABO/RH  I-STAT, CHEM 8   No results found.   1. Epistaxis       MDM  Bleeding restarted. Dr Brooke Dare placed nasal tampon and bleeding has subsided. Hmb today 8. (from 10.2 on 10/13/2011). Will consult w/ [facility] for admission.         Leanne Chang, NP 10/16/11 [phone]

## 2011-10-16 NOTE — ED Notes (Signed)
At bedside with pt and family to explain that pt will be admitted. Pt stated he felt like he was going to pass out. Pt had a syncopal episode for 3-4 secs. Pt alert and oriented after briefing "passing out." IV started and bolus ordered by MD. MD aware of syncopal episode.

## 2011-10-16 NOTE — Progress Notes (Signed)
Pt lives w wife, pcp is dr Assunta Found. Pt act w [facility], have left vm w mary w [facility] pt's adm, will resume [facility]hc at disch.

## 2011-10-16 NOTE — ED Notes (Signed)
Attempted to call report nurse unable to take at this time

## 2011-10-17 LAB — TYPE AND SCREEN
ABO/RH(D): O POS
Unit division: 0

## 2011-10-17 LAB — CBC
HCT: 23.5 % — ABNORMAL LOW (ref 39.0–52.0)
Hemoglobin: 7.8 g/dL — ABNORMAL LOW (ref 13.0–17.0)
MCH: 28.7 pg (ref 26.0–34.0)
MCHC: 33.2 g/dL (ref 30.0–36.0)

## 2011-10-17 LAB — GLUCOSE, CAPILLARY: Glucose-Capillary: 146 mg/dL — ABNORMAL HIGH (ref 70–99)

## 2011-10-17 MED ORDER — FERROUS SULFATE 325 (65 FE) MG PO TABS: 325.0000 mg | ORAL_TABLET | Freq: Three times a day (TID) | ORAL | Status: DC

## 2011-10-17 NOTE — Progress Notes (Signed)
Discharged to home with wife and family, discharge instructions reviewed with patient , new rx for ferrous sulfate to be picked up at [facility], transported via wheelchair to private vehicle to home, verbalizes understanding of followup and care, Berle Mull

## 2011-10-17 NOTE — Discharge Summary (Signed)
Physician Discharge Summary  Patient ID: Garrett Bradley MRN: [id number] DOB/AGE: 01/09/1934 77 y.o.  Admit date: 10/16/2011 Discharge date: 10/17/2011  Primary Care Physician:  Colette Ribas, MD   Discharge Diagnoses:   1- Epistaxis. 2-Hypertension 3-Diabetes type 2 4-BPH S/P TURP 5-Acute blood loss anemia 6-Hypothyroidism 7-Anxiety 8-chronic kidney disease stage IV 9-Insomnia 10-Elevated intraocular pressure 11-GERD 12-Dyslipidemia    Current Discharge Medication List    START taking these medications   Details  ferrous sulfate (FERROUSUL) 325 (65 FE) MG tablet Take 1 tablet (325 mg total) by mouth 3 (three) times daily with meals. Qty: 90 tablet, Refills: 11      CONTINUE these medications which have NOT CHANGED   Details  ALPRAZolam (XANAX) 1 MG tablet Take 0.5 tablets (0.5 mg total) by mouth 3 (three) times daily as needed for anxiety. For anxiety Qty: 30 tablet    cephALEXin (KEFLEX) 500 MG capsule Take 1 capsule (500 mg total) by mouth 2 (two) times daily. Qty: 10 capsule, Refills: 0    doxylamine, Sleep, (UNISOM) 25 MG tablet Take 25 mg by mouth at bedtime as needed. For sleep     fish oil-omega-3 fatty acids 1000 MG capsule Take 1 capsule (1 g total) by mouth 2 (two) times daily.    glimepiride (AMARYL) 4 MG tablet Take 4 mg by mouth every morning.     levothyroxine (SYNTHROID, LEVOTHROID) 50 MCG tablet Take 1 tablet (50 mcg total) by mouth daily.    niacin (NIASPAN) 500 MG CR tablet Take 500 mg by mouth at bedtime.      omeprazole (PRILOSEC) 20 MG capsule Take 1 capsule (20 mg total) by mouth daily. Qty: 14 capsule, Refills: 0    potassium chloride (KLOR-CON) 20 MEQ packet Take 20 mEq by mouth daily.      ramipril (ALTACE) 10 MG capsule Take 10 mg by mouth 2 (two) times daily.      terazosin (HYTRIN) 10 MG capsule Take 1 capsule (10 mg total) by mouth at bedtime.    Travoprost, BAK Free, (TRAVATAMN) 0.004 % SOLN ophthalmic solution Place 1  drop into both eyes at bedtime.           Disposition and Follow-up:  Patient has been discharged in stable and improved condition; he is status post cauterization and packing of his right Nostril; no further bleeding at this time. Patient will follow with Dr. Pollyann Kennedy on 10/21/2011, at that time, he will have right nostril packing removed and also will have a recheck inside his right nostril to make sure that no further bleeding has occurred. Patient will also follow with primary care physician over the next 10 days and at that time will be important to repeat a CBC in morning to follow the patient's hemoglobin level. At discharge hemoglobin was stable patient has received 2 units of PRBCs during this admission and has been started on ferrous sulfate 3 times a day.   Consults:  ENT was consulted (Dr. Pollyann Kennedy)   Significant Diagnostic Studies:  Patient had laryngoscopy of his right nostril on 10/16/2011, this procedure was performed by Dr. Pollyann Kennedy please refer to his procedure note for further details of the findings.  No other procedures were performed at this admission   Brief H and P: For complete details please refer to admission H and P, but in brief 77 year old male without significant past medical history for chronic kidney disease stage IV, diabetes, hypertension, dyslipidemia, BPH status post TURP; who came into the hospital secondary to acute  epistaxis and acute blood loss anemia.   Hospital Course:  1- Epistaxis: Status post right nostril cauterization and packing; no further bleeding. Patient will follow with Dr. Pollyann Kennedy on Wednesday, 10/21/2011 in order to have packing removed and also for further evaluation of his epistaxis.   2-Hypertension: Stable, will continue current medications. Further adjustment will be done by primary care physician as an outpatient. Patient instructed to follow a low-sodium diet.  3-Diabetes type 2: Patient will continue amaryl and he has been instructed to  follow a low carb diet.  4-BPH s/p TURP: Continue terazosin.  5-Acute blood loss anemia: Status post 2 units of PRBCs, hemoglobin is stable at discharge in the 8.0 range. He has been started on ferrous sulfate and will follow chronic initiation in about 10 days to have his CBC recheck to evaluate hemoglobin level.  6-Hypothyroidism: Continue Synthroid  7-Anxiety: Continue when necessary Xanax  8-chronic kidney disease stage IV: Stable at discharge. He will continue following with primary care physician. Patient advised to keep himself hydrated.  9-Insomnia: Continue Unisom as needed  10-Elevated intraocular pressure: Continue each bedtime travoprost eye drops as instructed.  11-GERD: Continue PPI  12-Dyslipidemia: Continue Niacin and Fish oil.    Time spent on Discharge: 40 minutes  Signed: Julies Carmickle 10/17/2011, 9:13 AM

## 2011-10-17 NOTE — Progress Notes (Signed)
He is doing great, no further bleeding. I will remove the pack on Wednesday either in the hospital or in the office. He may call our office at [phone] for follow up upon discharge.

## 2011-10-19 ENCOUNTER — Emergency Department (HOSPITAL_COMMUNITY): Payer: Medicare FFS

## 2011-10-19 ENCOUNTER — Ambulatory Visit: Admit: 2011-10-19 | Payer: Self-pay | Admitting: Otolaryngology

## 2011-10-19 ENCOUNTER — Encounter (HOSPITAL_COMMUNITY)
Admission: EM | Disposition: A | Discharge status: Home / Self Care | Payer: Self-pay | Source: Home / Self Care | Attending: Internal Medicine

## 2011-10-19 ENCOUNTER — Inpatient Hospital Stay (HOSPITAL_COMMUNITY)
Admission: EM | Admit: 2011-10-19 | Discharge: 2011-10-23 | DRG: 151 | Disposition: A | Discharge status: Home / Self Care | Payer: Medicare FFS | Attending: Internal Medicine | Admitting: Internal Medicine

## 2011-10-19 ENCOUNTER — Other Ambulatory Visit: Payer: Self-pay

## 2011-10-19 ENCOUNTER — Encounter (HOSPITAL_COMMUNITY): Payer: Self-pay | Admitting: *Deleted

## 2011-10-19 DIAGNOSIS — N179 Acute kidney failure, unspecified: Secondary | ICD-10-CM | POA: Diagnosis present

## 2011-10-19 DIAGNOSIS — D649 Anemia, unspecified: Secondary | ICD-10-CM

## 2011-10-19 DIAGNOSIS — Z79899 Other long term (current) drug therapy: Secondary | ICD-10-CM

## 2011-10-19 DIAGNOSIS — R55 Syncope and collapse: Secondary | ICD-10-CM

## 2011-10-19 DIAGNOSIS — I10 Essential (primary) hypertension: Secondary | ICD-10-CM

## 2011-10-19 DIAGNOSIS — E1122 Type 2 diabetes mellitus with diabetic chronic kidney disease: Secondary | ICD-10-CM | POA: Diagnosis present

## 2011-10-19 DIAGNOSIS — E1129 Type 2 diabetes mellitus with other diabetic kidney complication: Secondary | ICD-10-CM | POA: Diagnosis present

## 2011-10-19 DIAGNOSIS — K219 Gastro-esophageal reflux disease without esophagitis: Secondary | ICD-10-CM | POA: Diagnosis present

## 2011-10-19 DIAGNOSIS — R040 Epistaxis: Principal | ICD-10-CM | POA: Diagnosis present

## 2011-10-19 DIAGNOSIS — N184 Chronic kidney disease, stage 4 (severe): Secondary | ICD-10-CM

## 2011-10-19 DIAGNOSIS — E039 Hypothyroidism, unspecified: Secondary | ICD-10-CM | POA: Diagnosis present

## 2011-10-19 DIAGNOSIS — F341 Dysthymic disorder: Secondary | ICD-10-CM | POA: Diagnosis present

## 2011-10-19 DIAGNOSIS — I129 Hypertensive chronic kidney disease with stage 1 through stage 4 chronic kidney disease, or unspecified chronic kidney disease: Secondary | ICD-10-CM | POA: Diagnosis present

## 2011-10-19 DIAGNOSIS — E876 Hypokalemia: Secondary | ICD-10-CM | POA: Diagnosis present

## 2011-10-19 DIAGNOSIS — D62 Acute posthemorrhagic anemia: Secondary | ICD-10-CM | POA: Diagnosis present

## 2011-10-19 LAB — CBC
Hemoglobin: 7.9 g/dL — ABNORMAL LOW (ref 13.0–17.0)
MCH: 28.9 pg (ref 26.0–34.0)
MCHC: 32.4 g/dL (ref 30.0–36.0)
Platelets: 148 10*3/uL — ABNORMAL LOW (ref 150–400)
RDW: 16.3 % — ABNORMAL HIGH (ref 11.5–15.5)

## 2011-10-19 LAB — BASIC METABOLIC PANEL
Calcium: 8.5 mg/dL (ref 8.4–10.5)
GFR calc non Af Amer: 50 mL/min — ABNORMAL LOW (ref >90)
Glucose, Bld: 195 mg/dL — ABNORMAL HIGH (ref 70–99)
Sodium: 138 mEq/L (ref 135–145)

## 2011-10-19 LAB — POCT I-STAT, CHEM 8
BUN: 17 mg/dL (ref 6–23)
Calcium, Ion: 1.17 mmol/L (ref 1.12–1.32)
TCO2: 27 mmol/L (ref 0–100)

## 2011-10-19 LAB — TROPONIN I: Troponin I: <0.3 ng/mL (ref <0.30)

## 2011-10-19 LAB — PHOSPHORUS: Phosphorus: 3 mg/dL (ref 2.3–4.6)

## 2011-10-19 LAB — HEPATIC FUNCTION PANEL
Albumin: 2.4 g/dL — ABNORMAL LOW (ref 3.5–5.2)
Total Protein: 6.5 g/dL (ref 6.0–8.3)

## 2011-10-19 LAB — PROTIME-INR
INR: 1.15 (ref 0.00–1.49)
Prothrombin Time: 14.9 seconds (ref 11.6–15.2)

## 2011-10-19 LAB — MAGNESIUM: Magnesium: 1.8 mg/dL (ref 1.5–2.5)

## 2011-10-19 SURGERY — CONTROL OF EPISTAXIS
Anesthesia: General | Laterality: Right

## 2011-10-19 MED ORDER — LEVOTHYROXINE SODIUM 50 MCG PO TABS
50.0000 ug | ORAL_TABLET | Freq: Every day | ORAL | Status: DC
  Filled 2011-10-19: qty 1

## 2011-10-19 MED ORDER — FERROUS SULFATE 325 (65 FE) MG PO TABS
325.0000 mg | ORAL_TABLET | Freq: Three times a day (TID) | ORAL | Status: DC
  Filled 2011-10-19: qty 1

## 2011-10-19 MED ORDER — POTASSIUM CHLORIDE CRYS ER 20 MEQ PO TBCR: 20.0000 meq | EXTENDED_RELEASE_TABLET | Freq: Once | ORAL | Status: DC

## 2011-10-19 MED ORDER — ONDANSETRON HCL 4 MG/2ML IJ SOLN: 4.0000 mg | Freq: Four times a day (QID) | INTRAMUSCULAR | Status: DC | PRN

## 2011-10-19 MED ORDER — SODIUM CHLORIDE 0.9 % IJ SOLN: 3.0000 mL | INTRAMUSCULAR | Status: DC | PRN

## 2011-10-19 MED ORDER — SODIUM CHLORIDE 0.9 % IJ SOLN: 3.0000 mL | Freq: Two times a day (BID) | INTRAMUSCULAR | Status: DC

## 2011-10-19 MED ORDER — NIACIN ER (ANTIHYPERLIPIDEMIC) 500 MG PO TBCR
500.0000 mg | EXTENDED_RELEASE_TABLET | Freq: Every day | ORAL | Status: DC
  Filled 2011-10-19: qty 1

## 2011-10-19 MED ORDER — ACETAMINOPHEN 325 MG PO TABS: 650.0000 mg | ORAL_TABLET | Freq: Four times a day (QID) | ORAL | Status: DC | PRN

## 2011-10-19 MED ORDER — SODIUM CHLORIDE 0.9 % IV BOLUS (SEPSIS)
500.0000 mL | Freq: Once | INTRAVENOUS | Status: AC
  Administered 2011-10-19: 500 mL via INTRAVENOUS

## 2011-10-19 MED ORDER — HYDROCODONE-ACETAMINOPHEN 5-325 MG PO TABS: 1.0000 | ORAL_TABLET | ORAL | Status: DC | PRN

## 2011-10-19 MED ORDER — POLYETHYLENE GLYCOL 3350 17 G PO PACK
17.0000 g | PACK | Freq: Every day | ORAL | Status: DC
  Filled 2011-10-19: qty 1

## 2011-10-19 MED ORDER — ACETAMINOPHEN 650 MG RE SUPP: 650.0000 mg | Freq: Four times a day (QID) | RECTAL | Status: DC | PRN

## 2011-10-19 MED ORDER — ONDANSETRON HCL 4 MG PO TABS: 4.0000 mg | ORAL_TABLET | Freq: Four times a day (QID) | ORAL | Status: DC | PRN

## 2011-10-19 MED ORDER — MORPHINE SULFATE 2 MG/ML IJ SOLN: 1.0000 mg | INTRAMUSCULAR | Status: DC | PRN

## 2011-10-19 MED ORDER — PANTOPRAZOLE SODIUM 40 MG PO TBEC: 40.0000 mg | DELAYED_RELEASE_TABLET | Freq: Every day | ORAL | Status: DC

## 2011-10-19 MED ORDER — TRAVOPROST (BAK FREE) 0.004 % OP SOLN
1.0000 [drp] | Freq: Every day | OPHTHALMIC | Status: DC
  Filled 2011-10-19: qty 2.5

## 2011-10-19 MED ORDER — ONDANSETRON HCL 4 MG/2ML IJ SOLN: 4.0000 mg | Freq: Three times a day (TID) | INTRAMUSCULAR | Status: DC | PRN

## 2011-10-19 MED ORDER — SODIUM CHLORIDE 0.9 % IV SOLN: 250.0000 mL | INTRAVENOUS | Status: DC

## 2011-10-19 MED ORDER — POLYETHYLENE GLYCOL 3350 17 G PO PACK
17.0000 g | PACK | Freq: Every day | ORAL | Status: DC
  Administered 2011-10-20 – 2011-10-23 (×4): 17 g via ORAL
  Filled 2011-10-19 (×4): qty 1

## 2011-10-19 MED ORDER — ALBUTEROL SULFATE (5 MG/ML) 0.5% IN NEBU: 2.5000 mg | INHALATION_SOLUTION | RESPIRATORY_TRACT | Status: DC | PRN

## 2011-10-19 MED ORDER — SODIUM CHLORIDE 0.9 % IV SOLN: INTRAVENOUS | Status: DC

## 2011-10-19 MED ORDER — SODIUM CHLORIDE 0.9 % IJ SOLN
3.0000 mL | Freq: Two times a day (BID) | INTRAMUSCULAR | Status: DC
  Administered 2011-10-19 – 2011-10-23 (×8): 3 mL via INTRAVENOUS

## 2011-10-19 MED ORDER — GLIMEPIRIDE 4 MG PO TABS
4.0000 mg | ORAL_TABLET | ORAL | Status: DC
  Filled 2011-10-19: qty 1

## 2011-10-19 MED ORDER — ALPRAZOLAM 0.5 MG PO TABS: 0.5000 mg | ORAL_TABLET | Freq: Three times a day (TID) | ORAL | Status: DC | PRN

## 2011-10-19 NOTE — ED Notes (Signed)
Patient was in the shortstay waiting room waiting on a procedure in OR and became lethargic. Wife states patient c/o not being able to see and had a syncopal episode. Upon arrival to ed  Patient was lethargic and was incont. Of urine. Patient has packing  In the right nare and had come from Dr. Lucky Rathke office. Wife states nose has been bleeding off and on for 3 weeks and was just recently discharged from hospital for same.

## 2011-10-19 NOTE — ED Notes (Signed)
Blood consent signed by pt for transfusion 

## 2011-10-19 NOTE — ED Notes (Signed)
Attempted to take pt's standing orthostatic vitals. Pt stated he was becoming dizzy pt taken to the seated position and placed back in stretcher. Standing bp not taken HR up to 120.

## 2011-10-19 NOTE — H&P (Signed)
Hospital Admission Note Date: 10/19/2011  Patient name: Garrett Bradley Medical record number: [id number] Date of birth: 1934-10-26 Age: 77 y.o. Gender: male PCP: Colette Ribas, MD  Attending physician: Hartley Barefoot, MD  Chief Complaint:  History of Present Illness: This is a very pleasant 78 year old with PMH significant for hypertension, Diabetes recently discharge from hospital for Epistaxis  on 10-16-2011. Patient was discharge in stable condition he had during that hospitalization cauterization and packing of his right nostril. He was suppose to follow up with Dr Pollyann Kennedy on 11-14, but he started to bleed again from his nose. For that reason he presented to Dr Pollyann Kennedy office. He was then refer to [facility] to preop area for procedure. While he was in the waiting area he pass out. No seizure like activities witnessed. He started to feel lightheadedness, nausea, and diaphoresis. Denies focal weakness, chest pain, dyspnea or palpitation. He also relates cough for last couple of weeks, not getting worse.   Allergies: Review of patient's allergies indicates no known allergies. Past Medical History  Diagnosis      Epistaxis Date   Hypertension    Diabetes mellitus    Hypothyroidism    Depression        BPH S/P TURP       Acute blood loss anemia       Hypothyroidism       Anxiety       chronic kidney disease stage IV       Insomnia       Elevated intraocular pressure       GERD       Dyslipidemia  Prior to Admission medications   Medication Sig Start Date End Date Taking? Authorizing Provider  ALPRAZolam Prudy Feeler) 1 MG tablet Take 0.5 tablets (0.5 mg total) by mouth 3 (three) times daily as needed for anxiety. For anxiety 10/02/11  Yes Corinna L Sullivan  ferrous sulfate (FERROUSUL) 325 (65 FE) MG tablet Take 1 tablet (325 mg total) by mouth 3 (three) times daily with meals. 10/17/11 10/16/12 Yes Vassie Loll, MD  fish oil-omega-3 fatty acids 1000 MG capsule Take 1 capsule  (1 g total) by mouth 2 (two) times daily. 10/02/11  Yes Corinna L Sullivan  glimepiride (AMARYL) 4 MG tablet Take 4 mg by mouth every morning.    Yes Historical Provider, MD  levothyroxine (SYNTHROID, LEVOTHROID) 50 MCG tablet Take 1 tablet (50 mcg total) by mouth daily. 10/02/11  Yes Corinna L Lendell Caprice  niacin (NIASPAN) 500 MG CR tablet Take 500 mg by mouth at bedtime.     Yes Historical Provider, MD  omeprazole (PRILOSEC) 20 MG capsule Take 1 capsule (20 mg total) by mouth daily. 10/02/11 10/01/12 Yes Corinna L Sullivan  potassium chloride (KLOR-CON) 20 MEQ packet Take 20 mEq by mouth daily.     Yes Historical Provider, MD  ramipril (ALTACE) 10 MG capsule Take 10 mg by mouth 2 (two) times daily.     Yes Historical Provider, MD  Travoprost, BAK Free, (TRAVATAMN) 0.004 % SOLN ophthalmic solution Place 1 drop into both eyes at bedtime.     Yes Historical Provider, MD   Past Surgical History  Procedure Date  . Hernia repair   . Transurethral resection of prostate   . Transurethral resection of prostate 09/22/2011    Procedure: TRANSURETHRAL RESECTION OF THE PROSTATE (TURP);  Surgeon: Ky Barban;  Location: [facility];  Service: Urology;  Laterality: N/A;   History reviewed. No pertinent family history. History  Social History  . Marital Status: Married    Spouse Name: N/A    Number of Children: N/A  . Years of Education: N/A   Occupational History  . Not on file.   Social History Main Topics  . Smoking status: Never Smoker   . Smokeless tobacco: Never Used  . Alcohol Use: No  . Drug Use: No  . Sexually Active: Not Currently     Review of Systems: Pertinent items are noted in HPI. Physical Exam: Filed Vitals:   10/19/11 1600 10/19/11 1615 10/19/11 1630 10/19/11 1645  BP: 119/64 116/57    Pulse: 68 46 63 44  Resp: 13 10 15 17   SpO2: 99% 99% 97% 100%   No intake or output data in the 24 hours ending 10/19/11 1712 BP 132/79  Pulse 62  Temp(Src) 99.1 F (37.3 C) (Oral)   Resp 15  SpO2 95%  General Appearance:    Alert, cooperative, no distress, appears stated age  Head:    Normocephalic, without obvious abnormality, atraumatic  Eyes:    PERRL, conjunctiva/corneas clear, EOM's intact.       Ears:    Normal TM's and external ear canals, both ears  Nose:   With dry old blood, no active bleeding. Packing in place.  Throat:   Lips, mucosa, and tongue normal; teeth and gums normal  Neck:   Supple, symmetrical, trachea midline, no adenopathy;       thyroid:  No enlargement/tenderness/nodules; no carotid   bruit or JVD  Back:     Symmetric, no curvature, ROM normal, no CVA tenderness  Lungs:     Clear to auscultation bilaterally, respirations unlabored  Chest wall:    No tenderness or deformity  Heart:    Regular rate and rhythm, S1 and S2 normal, no murmur, rub   or gallop  Abdomen:     Soft, non-tender, bowel sounds active all four quadrants,    no masses, no organomegaly        Extremities:   Extremities normal, atraumatic, no cyanosis or edema  Pulses:   2+ and symmetric all extremities  Skin:   Skin color, texture, turgor normal, no rashes or lesions     Neurologic:   CNII-XII intact. Normal strength, sensation and reflexes      throughout   Lab results:  Basename 10/19/11 1345 10/19/11 1310  NA 141 138  K 3.4* 3.3*  CL 101 102  CO2 -- 28  GLUCOSE 201* 195*  BUN 17 17  CREATININE 1.40* 1.34  CALCIUM -- 8.5  MG -- --  PHOS -- --   No results found for this basename: AST:2,ALT:2,ALKPHOS:2,BILITOT:2,PROT:2,ALBUMIN:2 in the last 72 hours No results found for this basename: LIPASE:2,AMYLASE:2 in the last 72 hours  Basename 10/19/11 1345 10/19/11 1310 10/17/11 0034  WBC -- 8.5 5.7  NEUTROABS -- -- --  HGB 8.2* 7.9* --  HCT 24.0* 24.4* --  MCV -- 89.4 86.4  PLT -- 148* 128*    Basename 10/19/11 1310  CKTOTAL --  CKMB --  CKMBINDEX --  TROPONINI <0.30     Patient Active Hospital Problem List:  Syncope  This could be secondary to  anemia, or vasovagal syncope. Will admitted to telemetry to monitor for arrythmia. I will cycle cardiac enzymes, check 2 DECHO, orthostatic vitals, EKG. Will transfuse 1 unit PRBC. He was notice to be bradycardic ( HR 45)  in the ED , HR increase to 80 monitor on telemetry. Blood sugar was normal at time  of episode.  CKD (chronic kidney disease), stage IV Cr stable. I will hold ramipril.     DM type 2 causing CKD stage 4  Continue with glimepiride.   Anemia (09/24/2011) secondary to blood loss secondary to epistaxis. I will transfuse 1 unit PRBC. Repeat HB in AM.  Hypertension (10/02/2011). Hold ramipril, check orthostatic vitals first.  Epistaxis : Spoke with Dr Jenne Pane plan is to do procedure while patient in the hospital. Will do a work up for syncope first.  Hypokalemia: will give 20 meq kcl.       REGALADO,BELKYS [facility] [phone] 10/19/2011, 5:12 PM

## 2011-10-19 NOTE — ED Provider Notes (Signed)
History     CSN: [id number] Arrival date & time: 10/19/2011 12:53 PM   First MD Initiated Contact with Patient 10/19/11 1334      Chief Complaint  Patient presents with  . Loss of Consciousness    (Consider location/radiation/quality/duration/timing/severity/associated sxs/prior treatment) HPI Comments: Pt was sent to the preop area is ENT physician Dr. Pollyann Kennedy for procedure by his colleague for persistent nosebleed.  While sitting in the waiting area patient had onset of lightheadedness, nausea and diaphoresis before losing consciousness.  This lasted only a brief period the patient denies any chest pain, palpitations, shortness of breath.  His nausea has resolved.  Patient has not had this happen before.  He last ate at around 9:30 this morning.  His sugar was checked and it was normal.  Pt denies any past cardiac history.  Sent to the ER for further evaluation.  Patient is a 77 y.o. male presenting with syncope. The history is provided by the patient and the spouse. No language interpreter was used.  Loss of Consciousness This is a new problem. The current episode started less than 1 hour ago. The problem has been resolved. Pertinent negatives include no chest pain, no abdominal pain, no headaches and no shortness of breath. The symptoms are aggravated by nothing. The symptoms are relieved by nothing. He has tried nothing for the symptoms.    Past Medical History  Diagnosis Date  . Hypertension   . Diabetes mellitus   . Hypothyroidism   . Depression     Past Surgical History  Procedure Date  . Hernia repair   . Transurethral resection of prostate   . Transurethral resection of prostate 09/22/2011    Procedure: TRANSURETHRAL RESECTION OF THE PROSTATE (TURP);  Surgeon: Ky Barban;  Location: [facility];  Service: Urology;  Laterality: N/A;    History reviewed. No pertinent family history.  History  Substance Use Topics  . Smoking status: Never Smoker   . Smokeless  tobacco: Never Used  . Alcohol Use: No      Review of Systems  Constitutional: Negative.  Negative for fever and chills.  HENT: Negative.   Eyes: Negative.  Negative for discharge and redness.  Respiratory: Negative.  Negative for cough and shortness of breath.   Cardiovascular: Positive for syncope. Negative for chest pain.  Gastrointestinal: Negative.  Negative for nausea, vomiting and abdominal pain.  Genitourinary: Negative.  Negative for hematuria.  Musculoskeletal: Negative.  Negative for back pain.  Skin: Positive for pallor. Negative for color change and rash.  Neurological: Positive for syncope. Negative for headaches.  Hematological: Negative.  Negative for adenopathy.  Psychiatric/Behavioral: Negative.  Negative for confusion.  All other systems reviewed and are negative.    Allergies  Review of patient's allergies indicates no known allergies.  Home Medications   Current Outpatient Rx  Name Route Sig Dispense Refill  . ALPRAZOLAM 1 MG PO TABS Oral Take 0.5 tablets (0.5 mg total) by mouth 3 (three) times daily as needed for anxiety. For anxiety 30 tablet   . DOXYLAMINE SUCCINATE (SLEEP) 25 MG PO TABS Oral Take 25 mg by mouth at bedtime as needed. For sleep    . FERROUS SULFATE 325 (65 FE) MG PO TABS Oral Take 1 tablet (325 mg total) by mouth 3 (three) times daily with meals. 90 tablet 11  . OMEGA-3 FATTY ACIDS 1000 MG PO CAPS Oral Take 1 capsule (1 g total) by mouth 2 (two) times daily.    Marland Kitchen GLIMEPIRIDE 4 MG  PO TABS Oral Take 4 mg by mouth every morning.     Marland Kitchen LEVOTHYROXINE SODIUM 50 MCG PO TABS Oral Take 1 tablet (50 mcg total) by mouth daily.    Marland Kitchen NIACIN (ANTIHYPERLIPIDEMIC) 500 MG PO TBCR Oral Take 500 mg by mouth at bedtime.      . OMEPRAZOLE 20 MG PO CPDR Oral Take 1 capsule (20 mg total) by mouth daily. 14 capsule 0  . POTASSIUM CHLORIDE 20 MEQ PO PACK Oral Take 20 mEq by mouth daily.      Marland Kitchen RAMIPRIL 10 MG PO CAPS Oral Take 10 mg by mouth 2 (two) times daily.       Marland Kitchen TERAZOSIN HCL 10 MG PO CAPS Oral Take 1 capsule (10 mg total) by mouth at bedtime.    . TRAVOPROST (BAK FREE) 0.004 % OP SOLN Both Eyes Place 1 drop into both eyes at bedtime.        BP 114/68  Pulse 91  Resp 18  SpO2 99%  Physical Exam  Constitutional: He is oriented to person, place, and time. He appears well-developed and well-nourished.  Non-toxic appearance. He does not have a sickly appearance.  HENT:  Head: Normocephalic and atraumatic.  Eyes: Conjunctivae, EOM and lids are normal. Pupils are equal, round, and reactive to light.  Neck: Trachea normal, normal range of motion and full passive range of motion without pain. Neck supple.  Cardiovascular: Normal rate, regular rhythm and normal heart sounds.   Pulmonary/Chest: Effort normal and breath sounds normal. No respiratory distress. He has no wheezes. He has no rales. He exhibits no tenderness.  Abdominal: Soft. Normal appearance. He exhibits no distension. There is no tenderness. There is no rebound and no CVA tenderness.  Musculoskeletal: Normal range of motion.  Neurological: He is alert and oriented to person, place, and time. He has normal strength.  Skin: Skin is warm, dry and intact. No rash noted.  Psychiatric: He has a normal mood and affect. His behavior is normal. Judgment and thought content normal.    ED Course  Procedures (including critical care time)  Labs Reviewed  GLUCOSE, CAPILLARY - Abnormal; Notable for the following:    Glucose-Capillary 167 (*)    All other components within normal limits  CBC - Abnormal; Notable for the following:    RBC 2.73 (*)    Hemoglobin 7.9 (*)    HCT 24.4 (*)    RDW 16.3 (*)    Platelets 148 (*)    All other components within normal limits  BASIC METABOLIC PANEL - Abnormal; Notable for the following:    Potassium 3.3 (*)    Glucose, Bld 195 (*)    GFR calc non Af Amer 50 (*)    GFR calc Af Amer 58 (*)    All other components within normal limits  POCT I-STAT,  CHEM 8 - Abnormal; Notable for the following:    Potassium 3.4 (*)    Creatinine, Ser 1.40 (*)    Glucose, Bld 201 (*)    Hemoglobin 8.2 (*)    HCT 24.0 (*)    All other components within normal limits  I-STAT TROPONIN I  TROPONIN I   No results found.   No diagnosis found.   Date: 10/19/2011  Rate: 81  Rhythm: normal sinus rhythm  QRS Axis: left  Intervals: normal  ST/T Wave abnormalities: nonspecific T wave changes  Conduction Disutrbances:none  Narrative Interpretation:   Old EKG Reviewed: changes noted 10/13/2011 and patient had normal sinus rhythm  with frequent PVCs.    MDM  Patient with a syncopal episode in preop waiting area.  Patient may have had a vasovagal episode and or may also be dehydrated.  His orthostatic vital signs here do demonstrate that he becomes more tachycardic upon standing.  He is receiving IV fluids for this as well.  Patient does have a stable hemoglobin compared to 2 days ago with his epistaxis.  Given the patient's age with a history of diabetes and hypertension and PVCs in the past EKG I feel he warrants admission to the internal medicine service for further syncope evaluation.  I've discussed this with [facility] and they will admit him to T9 on a tele bed.  I also discussed this with Dr. Jenne Pane from ENT so he is aware the patient is being admitted to medicine.  He Would like to see the patient for procedure during his stay once medically cleared.  On discussion with the hospitalist the patient is going to receive one unit of packed red blood cells due to his anemia and likely contribution of this to his syncopal episode.  CRITICAL CARE Performed by: Emeline General A   Total critical care time: 36 minutes  Critical care time was exclusive of separately billable procedures and treating other patients.  Critical care was necessary to treat or prevent imminent or life-threatening deterioration.  Critical care was time spent personally by me on the  following activities: development of treatment plan with patient and/or surrogate as well as nursing, discussions with consultants, evaluation of patient's response to treatment, examination of patient, obtaining history from patient or surrogate, ordering and performing treatments and interventions, ordering and review of laboratory studies, ordering and review of radiographic studies, pulse oximetry and re-evaluation of patient's condition.      Nat Christen, MD 10/19/11 [phone]

## 2011-10-20 ENCOUNTER — Other Ambulatory Visit: Payer: Self-pay

## 2011-10-20 DIAGNOSIS — I369 Nonrheumatic tricuspid valve disorder, unspecified: Secondary | ICD-10-CM

## 2011-10-20 LAB — CARDIAC PANEL(CRET KIN+CKTOT+MB+TROPI)
CK, MB: 1.7 ng/mL (ref 0.3–4.0)
CK, MB: 1.8 ng/mL (ref 0.3–4.0)
Relative Index: INVALID (ref 0.0–2.5)
Total CK: 14 U/L (ref 7–232)
Troponin I: <0.3 ng/mL (ref <0.30)
Troponin I: <0.3 ng/mL (ref <0.30)

## 2011-10-20 LAB — GLUCOSE, CAPILLARY
Glucose-Capillary: 93 mg/dL (ref 70–99)
Glucose-Capillary: 98 mg/dL (ref 70–99)
Glucose-Capillary: 93 mg/dL (ref 70–99)

## 2011-10-20 LAB — APTT: aPTT: 30 seconds (ref 24–37)

## 2011-10-20 LAB — COMPREHENSIVE METABOLIC PANEL
AST: 13 U/L (ref 0–37)
Albumin: 2.1 g/dL — ABNORMAL LOW (ref 3.5–5.2)
Alkaline Phosphatase: 57 U/L (ref 39–117)
BUN: 16 mg/dL (ref 6–23)
CO2: 28 mEq/L (ref 19–32)
Chloride: 108 mEq/L (ref 96–112)
Creatinine, Ser: 1.2 mg/dL (ref 0.50–1.35)
GFR calc non Af Amer: 57 mL/min — ABNORMAL LOW (ref >90)
Potassium: 3.5 mEq/L (ref 3.5–5.1)
Total Bilirubin: 0.4 mg/dL (ref 0.3–1.2)

## 2011-10-20 LAB — BASIC METABOLIC PANEL
BUN: 15 mg/dL (ref 6–23)
Calcium: 8.3 mg/dL — ABNORMAL LOW (ref 8.4–10.5)
Creatinine, Ser: 1.22 mg/dL (ref 0.50–1.35)
GFR calc Af Amer: 65 mL/min — ABNORMAL LOW (ref >90)
GFR calc non Af Amer: 56 mL/min — ABNORMAL LOW (ref >90)
Glucose, Bld: 147 mg/dL — ABNORMAL HIGH (ref 70–99)

## 2011-10-20 LAB — CBC
MCH: 29.2 pg (ref 26.0–34.0)
MCV: 88.4 fL (ref 78.0–100.0)
Platelets: 138 10*3/uL — ABNORMAL LOW (ref 150–400)
RBC: 2.77 MIL/uL — ABNORMAL LOW (ref 4.22–5.81)
RDW: 16.2 % — ABNORMAL HIGH (ref 11.5–15.5)

## 2011-10-20 MED ORDER — LEVOTHYROXINE SODIUM 50 MCG PO TABS
50.0000 ug | ORAL_TABLET | Freq: Every day | ORAL | Status: DC
  Administered 2011-10-20 – 2011-10-23 (×4): 50 ug via ORAL
  Filled 2011-10-20 (×4): qty 1

## 2011-10-20 MED ORDER — FUROSEMIDE 10 MG/ML IJ SOLN
INTRAMUSCULAR | Status: AC
  Filled 2011-10-20: qty 4

## 2011-10-20 MED ORDER — PANTOPRAZOLE SODIUM 40 MG PO TBEC
40.0000 mg | DELAYED_RELEASE_TABLET | Freq: Every day | ORAL | Status: DC
  Administered 2011-10-20 – 2011-10-23 (×4): 40 mg via ORAL
  Filled 2011-10-20 (×4): qty 1

## 2011-10-20 MED ORDER — TRAVOPROST (BAK FREE) 0.004 % OP SOLN
1.0000 [drp] | Freq: Every day | OPHTHALMIC | Status: DC
  Administered 2011-10-20 – 2011-10-22 (×3): 1 [drp] via OPHTHALMIC
  Filled 2011-10-20: qty 2.5

## 2011-10-20 MED ORDER — NIACIN ER (ANTIHYPERLIPIDEMIC) 500 MG PO TBCR
500.0000 mg | EXTENDED_RELEASE_TABLET | Freq: Every day | ORAL | Status: DC
  Administered 2011-10-20 – 2011-10-22 (×3): 500 mg via ORAL
  Filled 2011-10-20 (×4): qty 1

## 2011-10-20 MED ORDER — SODIUM CHLORIDE 0.9 % IV SOLN: 250.0000 mL | INTRAVENOUS | Status: DC

## 2011-10-20 MED ORDER — INSULIN ASPART 100 UNIT/ML ~~LOC~~ SOLN
0.0000 [IU] | Freq: Three times a day (TID) | SUBCUTANEOUS | Status: DC
  Administered 2011-10-20 – 2011-10-21 (×2): 1 [IU] via SUBCUTANEOUS
  Administered 2011-10-22: 2 [IU] via SUBCUTANEOUS
  Administered 2011-10-22 – 2011-10-23 (×2): 1 [IU] via SUBCUTANEOUS
  Filled 2011-10-20: qty 3

## 2011-10-20 MED ORDER — FUROSEMIDE 10 MG/ML IJ SOLN
20.0000 mg | Freq: Once | INTRAMUSCULAR | Status: AC
  Administered 2011-10-20: 20 mg via INTRAVENOUS
  Filled 2011-10-20: qty 2

## 2011-10-20 MED ORDER — FERROUS SULFATE 325 (65 FE) MG PO TABS
325.0000 mg | ORAL_TABLET | Freq: Three times a day (TID) | ORAL | Status: DC
  Administered 2011-10-20 – 2011-10-23 (×10): 325 mg via ORAL
  Filled 2011-10-20 (×12): qty 1

## 2011-10-20 MED ORDER — GLIMEPIRIDE 4 MG PO TABS
4.0000 mg | ORAL_TABLET | ORAL | Status: DC
  Administered 2011-10-20 – 2011-10-23 (×3): 4 mg via ORAL
  Filled 2011-10-20 (×5): qty 1

## 2011-10-20 NOTE — Progress Notes (Signed)
UR review completed. 

## 2011-10-20 NOTE — Progress Notes (Signed)
Patient lives with spouse, NCM will continue to follow for dc needs. 

## 2011-10-20 NOTE — Progress Notes (Signed)
Subjective: Feeling ok , no more lightheadedness no, chest pain. No SOB ,  Objective: Filed Vitals:   10/19/11 2020 10/19/11 2224 10/19/11 2300 10/20/11 0455  BP: 132/79 163/90 142/77   Pulse:  82 87   Temp: 99.2 F (37.3 C) 98.9 F (37.2 C) 98.6 F (37 C)   TempSrc:  Oral    Resp:   16   Height:  6\' 4"  (1.93 m)    Weight:  129.275 kg (285 lb)  129.273 kg (284 lb 15.9 oz)  SpO2:  94%     Weight change:   Intake/Output Summary (Last 24 hours) at 10/20/11 0819 Last data filed at 10/19/11 2300  Gross per 24 hour  Intake  362.5 ml  Output      0 ml  Net  362.5 ml    General: Alert, awake, oriented x3, in no acute distress.  HEENT: No bruits, no goiter.  Heart: Regular rate and rhythm, without murmurs, rubs, gallops.  Lungs:  bilateral air movement, no wheezes.  Abdomen: Soft, nontender, nondistended, positive bowel sounds.  Neuro: Grossly intact, nonfocal.   Lab Results:  Basename 10/20/11 0543 10/19/11 1742 10/19/11 1345 10/19/11 1310  NA 142 -- 141 --  K 3.5 -- 3.4* --  CL 108 -- 101 --  CO2 28 -- -- 28  GLUCOSE 93 -- 201* --  BUN 16 -- 17 --  CREATININE 1.20 -- 1.40* --  CALCIUM 8.3* -- -- 8.5  MG -- 1.8 -- --  PHOS -- 3.0 -- --    [facility] 10/20/11 0543 10/19/11 1742  AST 13 18  ALT 13 16  ALKPHOS 57 62  BILITOT 0.4 0.3  PROT 5.9* 6.5  ALBUMIN 2.1* 2.4*   No results found for this basename: LIPASE:2,AMYLASE:2 in the last 72 hours  Basename 10/20/11 0543 10/19/11 1345 10/19/11 1310  WBC 5.7 -- 8.5  NEUTROABS -- -- --  HGB 8.1* 8.2* --  HCT 24.5* 24.0* --  MCV 88.4 -- 89.4  PLT 138* -- 148*    Basename 10/20/11 0239 10/19/11 1310  CKTOTAL 14 --  CKMB 1.9 --  CKMBINDEX -- --  TROPONINI <0.30 <0.30    Studies/Results: Dg Chest 2 View  10/19/2011  *RADIOLOGY REPORT*  Clinical Data: Cough.  Epistaxis.  Hypertension.  Diabetes.  CHEST - 2 VIEW  Comparison: Previous examinations, the most recent dated 09/29/2011.  Findings: Stable enlarged  cardiac silhouette and mildly prominent pulmonary vasculature.  The lungs are clear.  Changes of DISH in the thoracic spine.  IMPRESSION: Stable cardiomegaly and mild pulmonary vascular congestion.  Original Report Authenticated By: Darrol Angel, M.D.    Medications: I have reviewed the patient's current medications.   Patient Active Hospital Problem List:  Syncope (10/19/2011) Probably secondary to anemia, cardiac enzymes times 2 negative. Would like to have 2 ECHO result prior to procedure for epistaxis.  Orthostatic vitals pending.   Epistaxis (10/19/2011) No active bleeding. Pending syncope work up.   Anemia (09/24/2011) Mild increase in Hb,  I will repeat HB today and will consider transfusion if  Still  8. Will give lasix with blood transfusion.    CKD (chronic kidney disease), stage IV (09/24/2011) Cr decrease to 1.2. Continue to monitor.   DM type 2 causing CKD stage 4 (09/24/2011)  Check CBG, SSI as needed.    Hypertension (10/02/2011)  Orthostatic vitals pending, to consider restart lisinopril.    Hypokalemia: resolved.       LOS: 1 day   Camani Sesay Pager:  [phone] [facility] 10/20/2011, 8:19 AM

## 2011-10-21 LAB — BASIC METABOLIC PANEL
BUN: 13 mg/dL (ref 6–23)
CO2: 27 mEq/L (ref 19–32)
Chloride: 101 mEq/L (ref 96–112)
Creatinine, Ser: 1.19 mg/dL (ref 0.50–1.35)

## 2011-10-21 LAB — CBC
HCT: 28.7 % — ABNORMAL LOW (ref 39.0–52.0)
MCV: 89.1 fL (ref 78.0–100.0)
RBC: 3.22 MIL/uL — ABNORMAL LOW (ref 4.22–5.81)
WBC: 6.1 10*3/uL (ref 4.0–10.5)

## 2011-10-21 LAB — GLUCOSE, CAPILLARY
Glucose-Capillary: 112 mg/dL — ABNORMAL HIGH (ref 70–99)
Glucose-Capillary: 70 mg/dL (ref 70–99)

## 2011-10-21 LAB — TYPE AND SCREEN
ABO/RH(D): O POS
Unit division: 0

## 2011-10-21 MED ORDER — OXYMETAZOLINE HCL 0.05 % NA SOLN
1.0000 | NASAL | Status: DC
  Filled 2011-10-21: qty 15

## 2011-10-21 MED ORDER — POTASSIUM CHLORIDE CRYS ER 20 MEQ PO TBCR
30.0000 meq | EXTENDED_RELEASE_TABLET | Freq: Once | ORAL | Status: AC
  Administered 2011-10-21: 30 meq via ORAL
  Filled 2011-10-21: qty 1

## 2011-10-21 NOTE — Progress Notes (Signed)
Subjective:  Patient denies any chest pain or shortness of breath, no further fainting episodes, cardiac enzyme x3 negative, telemetry monitor uneventful, EKG negative Objective: Vital signs in last 24 hours: Filed Vitals:   10/21/11 0549 10/21/11 0550 10/21/11 0553 10/21/11 1400  BP: 156/83 156/85 123/77 127/76  Pulse: 99 93 109 74  Temp:    94.7 F (34.8 C)  TempSrc:    Oral  Resp:    20  Height:      Weight:      SpO2:    98%   Weight change: -5.352 kg (-11 lb 12.8 oz)  Intake/Output Summary (Last 24 hours) at 10/21/11 1750 Last data filed at 10/21/11 1200  Gross per 24 hour  Intake   1316 ml  Output   1400 ml  Net    -84 ml    BP 127/76  Pulse 74  Temp(Src) 94.7 F (34.8 C) (Oral)  Resp 20  Ht 6\' 4"  (1.93 m)  Wt 123.923 kg (273 lb 3.2 oz)  BMI 33.26 kg/m2  SpO2 98%  General Appearance:    Alert, cooperative, no distress, appears stated age  Head:    Normocephalic, without obvious abnormality, atraumatic  Eyes:    PERRL, conjunctiva/corneas clear, EOM's intact, fundi    benign, both eyes       Ears:    Normal TM's and external ear canals, both ears  Nose:  sp nasal packing with some blood oozing from nares  Throat:   Lips, mucosa, and tongue normal; teeth and gums normal  Neck:   Supple, symmetrical, trachea midline, no adenopathy;       thyroid:  No enlargement/tenderness/nodules; no carotid   bruit or JVD  Back:     Symmetric, no curvature, ROM normal, no CVA tenderness  Lungs:     Clear to auscultation bilaterally, respirations unlabored  Chest wall:    No tenderness or deformity  Heart:    Regular rate and rhythm, S1 and S2 normal, no murmur, rub   or gallop  Abdomen:     Soft, non-tender, bowel sounds active all four quadrants,    no masses, no organomegaly  Genitalia:    Normal male without lesion, discharge or tenderness  Rectal:    Normal tone, normal prostate, no masses or tenderness;   guaiac negative stool  Extremities:   Extremities normal,  atraumatic, no cyanosis or edema  Pulses:   2+ and symmetric all extremities  Skin:   Skin color, texture, turgor normal, no rashes or lesions  Lymph nodes:   Cervical, supraclavicular, and axillary nodes normal  Neurologic:   CNII-XII intact. Normal strength, sensation and reflexes      throughout    Lab Results:  Basename 10/21/11 0945 10/20/11 0902 10/19/11 1742  NA 137 141 --  K 3.3* 3.5 --  CL 101 106 --  CO2 27 28 --  GLUCOSE 166* 147* --  BUN 13 15 --  CREATININE 1.19 1.22 --  CALCIUM 8.6 8.3* --  MG -- -- 1.8  PHOS -- -- 3.0    Basename 10/20/11 0543 10/19/11 1742  AST 13 18  ALT 13 16  ALKPHOS 57 62  BILITOT 0.4 0.3  PROT 5.9* 6.5  ALBUMIN 2.1* 2.4*   No results found for this basename: LIPASE:2,AMYLASE:2 in the last 72 hours  Basename 10/21/11 0945 10/20/11 1634 10/20/11 0543  WBC 6.1 -- 5.7  NEUTROABS -- -- --  HGB 9.5* 7.7* --  HCT 28.7* 23.2* --  MCV  89.1 -- 88.4  PLT 141* -- 138*    Basename 10/20/11 1635 10/20/11 0902 10/20/11 0239  CKTOTAL 14 13 14   CKMB 1.8 1.7 1.9  CKMBINDEX -- -- --  TROPONINI <0.30 <0.30 <0.30    Studies/Results: Dg Chest 2 View  10/19/2011  *RADIOLOGY REPORT*  Clinical Data: Cough.  Epistaxis.  Hypertension.  Diabetes.  CHEST - 2 VIEW  Comparison: Previous examinations, the most recent dated 09/29/2011.  Findings: Stable enlarged cardiac silhouette and mildly prominent pulmonary vasculature.  The lungs are clear.  Changes of DISH in the thoracic spine.  IMPRESSION: Stable cardiomegaly and mild pulmonary vascular congestion.  Original Report Authenticated By: Darrol Angel, M.D.   US Renal  09/24/2011  *RADIOLOGY REPORT*  Clinical Data: Acute renal failure  RENAL/URINARY TRACT ULTRASOUND COMPLETE  Comparison:  CT dated 09/16/2011  Findings:  Evaluation is constrained by body habitus and overlying bowel gas.  Horsehoe kidney.  Right Kidney:  Right renal moiety measures approximately 12.1 cm. Poorly visualized.  No gross  hydronephrosis.  Left Kidney:  Left renal moiety measures approximately 12.4 cm. Poorly visualized.  No gross hydronephrosis.  Bladder:  Distended despite indwelling Foley catheter.  Calculated bladder volume 641 ml.  IMPRESSION: Horseshoe kidney.  No gross hydronephrosis.  Distended bladder despite indwelling Foley catheter.  Clinical assessment is suggested.  Original Report Authenticated By: Charline Bills, M.D.   Dg Chest Portable 1 View  09/29/2011  *RADIOLOGY REPORT*  Clinical Data: PICC line placement  PORTABLE CHEST - 1 VIEW  Comparison: Portable exam 1133 hours compared to 09/24/2011  Findings: Right arm PICC line, tip projects over SVC. Enlargement of cardiac silhouette. Mediastinal contours and pulmonary vascularity normal. Persistent left lower lobe atelectasis versus consolidation. Improved aeration right base. No pneumothorax or right pleural effusion. Small left pleural effusion.  IMPRESSION: Tip of right arm PICC line projects over SVC. Enlargement of cardiac silhouette. Persistent left lower lobe atelectasis versus consolidation and small left pleural effusion.  Original Report Authenticated By: Lollie Marrow, M.D.   Dg Chest Portable 1 View  09/24/2011  *RADIOLOGY REPORT*  Clinical Data: Tachypnea, dyspnea  PORTABLE CHEST - 1 VIEW  Comparison: 09/23/2011; 05/13/2009  Findings:  Examination is degraded secondary to patient motion artifact. Grossly unchanged enlarged cardiac silhouette and mediastinal contours.  Left subclavian approach central venous catheter tip is obscured by motion, seen at least to the level of the central aspect of the left brachiocephalic vein. Pulmonary vasculature remains distinct.  No change minimal increase in perihilar and bilateral medial basilar heterogeneous opacities with at least partial atelectasis of the right lower lobe. No definite pleural effusion or pneumothorax.  Grossly unchanged bones.  IMPRESSION: 1.  Examination degraded by patient motion  artifact. 2.  No change to minimal increase in perihilar and bilateral medial basilar heterogeneous opacities with at least partial atelectasis of the right lower lobe. 3.  Query mild pulmonary edema.  Original Report Authenticated By: Waynard Reeds, M.D.   Dg Chest Portable 1 View  09/23/2011  *RADIOLOGY REPORT*  Clinical Data: Sepsis, evaluate for pneumonia  PORTABLE CHEST - 1 VIEW  Comparison: Portable chest x-ray of 05/13/2009  Findings: There is mild basilar atelectasis present.  Cardiomegaly is stable.  No focal infiltrate or effusion is seen.  IMPRESSION: Stable cardiomegaly.  Mild basilar atelectasis.  Original Report Authenticated By: Juline Patch, M.D.   Dg Chest Port 1v Same Day  09/23/2011  *RADIOLOGY REPORT*  Clinical Data: Left central line placement.  PORTABLE CHEST -  1 VIEW SAME DAY  Comparison: Chest radiograph 09/23/2011  Findings: Cardiac leads project over the chest.  A left subclavian central venous catheter is in place.  The distal tip is in the left brachiocephalic vein.  Cardiomegaly is stable.  Lung volumes are low.  Mild bibasilar atelectasis is similar to recent prior examination.  No pneumothorax, pleural effusion or airspace disease is identified.  IMPRESSION: Left subclavian central venous catheter terminates in the left brachiocephalic vein.  No complicating feature identified.  Cardiomegaly and bibasilar atelectasis.  Original Report Authenticated By: Britta Mccreedy, M.D.   Dg Abd Acute W/chest  09/28/2011  *RADIOLOGY REPORT*  Clinical Data: Nausea, acute renal failure  ACUTE ABDOMEN SERIES (ABDOMEN 2 VIEW & CHEST 1 VIEW)  Comparison: 09/24/2011  Findings: Cardiomegaly again noted.  Again noted mild congestion/edema.  Left basilar atelectasis or infiltrate.  Distended small bowel loops are noted mid abdomen suspicious for ileus or early bowel obstruction. No free abdominal air.  IMPRESSION: Question mild congestion/edema.  Left basilar atelectasis or infiltrate. Distended  small bowel loops mid abdomen suspicious for ileus or early bowel obstruction.  Original Report Authenticated By: Natasha Mead, M.D.    Medications: I have reviewed the patient's current medications. Scheduled Meds:   . ferrous sulfate  325 mg Oral TID WC  . furosemide  20 mg Intravenous Once  . glimepiride  4 mg Oral QAM  . insulin aspart  0-9 Units Subcutaneous TID WC  . levothyroxine  50 mcg Oral Daily  . niacin  500 mg Oral QHS  . pantoprazole  40 mg Oral Q1200  . polyethylene glycol  17 g Oral Daily  . sodium chloride  3 mL Intravenous Q12H  . Travoprost (BAK Free)  1 drop Both Eyes QHS   Continuous Infusions:   . sodium chloride     PRN Meds:.acetaminophen, acetaminophen, HYDROcodone-acetaminophen, morphine, ondansetron (ZOFRAN) IV, ondansetron, sodium chloride  Assessment/Plan: Active Problems:  1-syncopal episode likely secondary to symptomatic anemia, hypertension and vasovagal, as we mentioned telemetry monitor was without event, 2-D echo pending at this time. 2-anemia status post blood transfusion hemoglobin remained stable 3-epistaxis status post nasal packing this case discussed with Dr. Jenne Pane and patient is medically cleared to proceed with a nasal cauterization if needed as I felt the cause of patient's syncope is likely secondary to hypotension and symptomatic anemia discussed the case with the patient and with Dr. Jenne Pane Hypertension stable at it was holding ramipril is stable and he was holding ramipril /Acute renal failure resolved Hypokalemia replacement  LOS: 2 days  Myrtha Tonkovich I. 10/21/2011, 5:50 PM

## 2011-10-21 NOTE — Progress Notes (Signed)
Subjective: Doing fine.  No bleeding from nose since admission. Medical doctor called me this evening to report that he is medically cleared for anesthesia, if needed.  Patient with no complaints.  Objective: Vital signs in last 24 hours: Temp:  [94.7 F (34.8 C)-98.6 F (37 C)] 94.7 F (34.8 C) (11/14 1400) Pulse Rate:  [72-109] 74  (11/14 1400) Resp:  [14-20] 20  (11/14 1400) BP: (123-160)/(59-86) 127/76 mmHg (11/14 1400) SpO2:  [97 %-98 %] 98 % (11/14 1400) Weight:  [123.923 kg (273 lb 3.2 oz)] 273 lb 3.2 oz (123.923 kg) (11/14 0100)  General appearance: alert, cooperative and no distress Nose: Nares normal. Septum midline. Mucosa normal. No drainage or sinus tenderness., Right nasal pack in place. No bleeding.  @LABLAST2 (wbc:2,hgb:2,hct:2,plt:2)  Basename 10/21/11 0945 10/20/11 0902  NA 137 141  K 3.3* 3.5  CL 101 106  CO2 27 28  GLUCOSE 166* 147*  BUN 13 15  CREATININE 1.19 1.22  CALCIUM 8.6 8.3*    Medications: I have reviewed the patient's current medications.  Assessment/Plan: Right posterior epistaxis, controlled with pack. I discussed options with the patient including leaving the pack in place for another couple of days and then removing it to see if bleeding recurs versus proceeding with nasal endoscopy under anesthesia with cautery of bleeding site.  I discussed the potential that a bleeding source will not be identified during endoscopy.  We agreed to leave the pack in until tomorrow (3 days total) and remove it in the morning to see if bleeding recurs.  If so, we will replace the pack and then go on to nasal endoscopy under anesthesia.  Risks, benefits, and alternatives were discussed.   LOS: 2 days   Garrett Bradley D 10/21/2011, 6:17 PM

## 2011-10-22 ENCOUNTER — Encounter (HOSPITAL_COMMUNITY)
Admission: EM | Disposition: A | Discharge status: Home / Self Care | Payer: Self-pay | Source: Home / Self Care | Attending: Internal Medicine

## 2011-10-22 LAB — CBC
MCHC: 32.7 g/dL (ref 30.0–36.0)
MCV: 89.8 fL (ref 78.0–100.0)
Platelets: 151 10*3/uL (ref 150–400)
RDW: 16.3 % — ABNORMAL HIGH (ref 11.5–15.5)
WBC: 5.5 10*3/uL (ref 4.0–10.5)

## 2011-10-22 LAB — GLUCOSE, CAPILLARY: Glucose-Capillary: 105 mg/dL — ABNORMAL HIGH (ref 70–99)

## 2011-10-22 SURGERY — SEPTOPLASTY, NOSE, WITH NASAL TURBINATE REDUCTION
Anesthesia: General

## 2011-10-22 MED ORDER — RAMIPRIL 2.5 MG PO CAPS
2.5000 mg | ORAL_CAPSULE | Freq: Every day | ORAL | Status: DC
  Administered 2011-10-22 – 2011-10-23 (×2): 2.5 mg via ORAL
  Filled 2011-10-22 (×2): qty 1

## 2011-10-22 MED ORDER — TERAZOSIN HCL 2 MG PO CAPS
2.0000 mg | ORAL_CAPSULE | Freq: Every day | ORAL | Status: DC
  Administered 2011-10-22: 2 mg via ORAL
  Filled 2011-10-22 (×2): qty 1

## 2011-10-22 NOTE — Progress Notes (Addendum)
Subjective: Patient is status post right nasal p removed currently there is no evidence of recurrent of right nasal epistaxis, patient denies any chest pain or shortness of breath, noticed some drop in in Hemoglobin. No farther syncopal event, telemetry monitor normal sinus rhythm    Objective: Vital signs in last 24 hours: Filed Vitals:   10/21/11 0553 10/21/11 1400 10/21/11 2203 10/22/11 0554  BP: 123/77 127/76 153/75 165/84  Pulse: 109 74 86 81  Temp:  94.7 F (34.8 C) 98.4 F (36.9 C) 98.3 F (36.8 C)  TempSrc:  Oral Oral Oral  Resp:  20 18 15   Height:      Weight:    121.2 kg (267 lb 3.2 oz)  SpO2:  98% 97% 94%   Weight change: -2.723 kg (-6 lb)  Intake/Output Summary (Last 24 hours) at 10/22/11 1024 Last data filed at 10/22/11 0700  Gross per 24 hour  Intake    363 ml  Output    250 ml  Net    113 ml    BP 165/84  Pulse 81  Temp(Src) 98.3 F (36.8 C) (Oral)  Resp 15  Ht 6\' 4"  (1.93 m)  Wt 121.2 kg (267 lb 3.2 oz)  BMI 32.52 kg/m2  SpO2 94%  General Appearance:    Alert, cooperative, no distress, appears stated age  Head:    Normocephalic, without obvious abnormality, atraumatic  Eyes:    PERRL, conjunctiva/corneas clear, EOM's intact, fundi    benign, both eyes       Ears:    Normal TM's and external ear canals, both ears  Nose:  sp removal of right nares pack, no evidence of active bleeding  Throat:   Lips, mucosa, and tongue normal; teeth and gums normal  Neck:   Supple, symmetrical, trachea midline, no adenopathy;       thyroid:  No enlargement/tenderness/nodules; no carotid   bruit or JVD  Back:     Symmetric, no curvature, ROM normal, no CVA tenderness  Lungs:     Clear to auscultation bilaterally, respirations unlabored  Chest wall:    No tenderness or deformity  Heart:    Regular rate and rhythm, S1 and S2 normal, no murmur, rub   or gallop  Abdomen:     Soft, non-tender, bowel sounds active all four quadrants,    no masses, no organomegaly    Genitalia:    Normal male without lesion, discharge or tenderness  Rectal:    Normal tone, normal prostate, no masses or tenderness;   guaiac negative stool  Extremities:    Chronic venous stasis changes , no edema  Pulses:   2+ and symmetric all extremities  Skin:   Skin color, texture, turgor normal, no rashes or lesions  Lymph nodes:   Cervical, supraclavicular, and axillary nodes normal  Neurologic:   CNII-XII intact. Normal strength, sensation and reflexes      throughout    Lab Results:   Basename 10/20/11 0543 10/19/11 1742  AST 13 18  ALT 13 16  ALKPHOS 57 62  BILITOT 0.4 0.3  PROT 5.9* 6.5  ALBUMIN 2.1* 2.4*   No results found for this basename: LIPASE:2,AMYLASE:2 in the last 72 hours  Basename 10/22/11 0520 10/21/11 0945  WBC 5.5 6.1  NEUTROABS -- --  HGB 8.6* 9.5*  HCT 26.3* 28.7*  MCV 89.8 89.1  PLT 151 141*    Basename 10/20/11 1635 10/20/11 0902 10/20/11 0239  CKTOTAL 14 13 14   CKMB 1.8 1.7 1.9  CKMBINDEX -- -- --  TROPONINI <0.30 <0.30 <0.30   Studies/Results: Dg Chest 2 View  10/19/2011  *RADIOLOGY REPORT*  Clinical Data: Cough.  Epistaxis.  Hypertension.  Diabetes.  CHEST - 2 VIEW  Comparison: Previous examinations, the most recent dated 09/29/2011.  Findings: Stable enlarged cardiac silhouette and mildly prominent pulmonary vasculature.  The lungs are clear.  Changes of DISH in the thoracic spine.  IMPRESSION: Stable cardiomegaly and mild pulmonary vascular congestion.  Original Report Authenticated By: Darrol Angel, M.D.   US Renal  09/24/2011  *RADIOLOGY REPORT*  Clinical Data: Acute renal failure  RENAL/URINARY TRACT ULTRASOUND COMPLETE  Comparison:  CT dated 09/16/2011  Findings:  Evaluation is constrained by body habitus and overlying bowel gas.  Horsehoe kidney.  Right Kidney:  Right renal moiety measures approximately 12.1 cm. Poorly visualized.  No gross hydronephrosis.  Left Kidney:  Left renal moiety measures approximately 12.4 cm. Poorly  visualized.  No gross hydronephrosis.  Bladder:  Distended despite indwelling Foley catheter.  Calculated bladder volume 641 ml.  IMPRESSION: Horseshoe kidney.  No gross hydronephrosis.  Distended bladder despite indwelling Foley catheter.  Clinical assessment is suggested.  Original Report Authenticated By: Charline Bills, M.D.   Dg Chest Portable 1 View  09/29/2011  *RADIOLOGY REPORT*  Clinical Data: PICC line placement  PORTABLE CHEST - 1 VIEW  Comparison: Portable exam 1133 hours compared to 09/24/2011  Findings: Right arm PICC line, tip projects over SVC. Enlargement of cardiac silhouette. Mediastinal contours and pulmonary vascularity normal. Persistent left lower lobe atelectasis versus consolidation. Improved aeration right base. No pneumothorax or right pleural effusion. Small left pleural effusion.  IMPRESSION: Tip of right arm PICC line projects over SVC. Enlargement of cardiac silhouette. Persistent left lower lobe atelectasis versus consolidation and small left pleural effusion.  Original Report Authenticated By: Lollie Marrow, M.D.   Dg Chest Portable 1 View  09/24/2011  *RADIOLOGY REPORT*  Clinical Data: Tachypnea, dyspnea  PORTABLE CHEST - 1 VIEW  Comparison: 09/23/2011; 05/13/2009  Findings:  Examination is degraded secondary to patient motion artifact. Grossly unchanged enlarged cardiac silhouette and mediastinal contours.  Left subclavian approach central venous catheter tip is obscured by motion, seen at least to the level of the central aspect of the left brachiocephalic vein. Pulmonary vasculature remains distinct.  No change minimal increase in perihilar and bilateral medial basilar heterogeneous opacities with at least partial atelectasis of the right lower lobe. No definite pleural effusion or pneumothorax.  Grossly unchanged bones.  IMPRESSION: 1.  Examination degraded by patient motion artifact. 2.  No change to minimal increase in perihilar and bilateral medial basilar  heterogeneous opacities with at least partial atelectasis of the right lower lobe. 3.  Query mild pulmonary edema.  Original Report Authenticated By: Waynard Reeds, M.D.   Dg Chest Portable 1 View  1 Medications: I have reviewed the patient's current medications. Scheduled Meds:   . ferrous sulfate  325 mg Oral TID WC  . glimepiride  4 mg Oral QAM  . insulin aspart  0-9 Units Subcutaneous TID WC  . levothyroxine  50 mcg Oral Daily  . niacin  500 mg Oral QHS  . oxymetazoline  1 spray Each Nare Custom  . pantoprazole  40 mg Oral Q1200  . polyethylene glycol  17 g Oral Daily  . potassium chloride  30 mEq Oral Once  . sodium chloride  3 mL Intravenous Q12H  . Travoprost (BAK Free)  1 drop Both Eyes QHS   Continuous  Infusions:   . sodium chloride     PRN Meds:.acetaminophen, acetaminophen, HYDROcodone-acetaminophen, morphine, ondansetron (ZOFRAN) IV, ondansetron, sodium chloride  Assessment/Plan: Active Problems: Syncope:ech reviewed EF 50-55% no valvular abnormality. Cardiac enzymes are negative, telemetry monitor negative, likely the cause of the patient's syncope is symptomatic anemia and hypotension. No neuro deficit noticed 2 -right nares epistaxis status post removal of the pack per Dr. Jenne Pane to proceed with cauterization I felt the cause of patient's syncope is likely from his anemia and losing blood. 3- ARF resolved   4=-Anemia sp PRBCS. Will monitor further drop, iron supplement 5- Hypertension: now with hypertension. Resume terazosin 6-DM  Consider holding Amaryl if plan for surgery    LOS: 3 days  Khadija Thier I. 10/22/2011, 10:24 AM

## 2011-10-22 NOTE — Progress Notes (Signed)
Subjective: No bleeding overnight.  No complaints.  Objective: Vital signs in last 24 hours: Temp:  [94.7 F (34.8 C)-98.4 F (36.9 C)] 98.3 F (36.8 C) (11/15 0554) Pulse Rate:  [74-86] 81  (11/15 0554) Resp:  [15-20] 15  (11/15 0554) BP: (127-165)/(75-84) 165/84 mmHg (11/15 0554) SpO2:  [94 %-98 %] 94 % (11/15 0554) Weight:  [121.2 kg (267 lb 3.2 oz)] 267 lb 3.2 oz (121.2 kg) (11/15 0554)  General appearance: alert, cooperative and no distress Nose: Nares normal. Septum midline. Mucosa normal. No drainage or sinus tenderness., Right nasal pack in place with no bleeding.  @LABLAST2 (wbc:2,hgb:2,hct:2,plt:2)  Basename 10/21/11 0945 10/20/11 0902  NA 137 141  K 3.3* 3.5  CL 101 106  CO2 27 28  GLUCOSE 166* 147*  BUN 13 15  CREATININE 1.19 1.22  CALCIUM 8.6 8.3*    Medications: I have reviewed the patient's current medications.  Assessment/Plan: Right posterior epistaxis. Right nasal pack removed and no bleeding occurred.  Will keep patient NPO until lunch time in case he bleeds.  If he does, he will be taken to the operating room at 1130.  If he does not bleed, the surgery will be cancelled and he can eat lunch.  I would recommend that he stay in hospital through the day today for observation but could be discharged this evening if still no bleeding.  He should not blow his nose but should use saline nasal spray in the right nostril 5-6 times per day.  He can follow-up with me next week.   LOS: 3 days   Collyn Ribas D 10/22/2011, 8:28 AM

## 2011-10-23 LAB — GLUCOSE, CAPILLARY
Glucose-Capillary: 110 mg/dL — ABNORMAL HIGH (ref 70–99)
Glucose-Capillary: 141 mg/dL — ABNORMAL HIGH (ref 70–99)

## 2011-10-23 LAB — CBC
HCT: 26.3 % — ABNORMAL LOW (ref 39.0–52.0)
Hemoglobin: 8.5 g/dL — ABNORMAL LOW (ref 13.0–17.0)
MCH: 29.4 pg (ref 26.0–34.0)
MCHC: 32.3 g/dL (ref 30.0–36.0)
MCV: 91 fL (ref 78.0–100.0)
RBC: 2.89 MIL/uL — ABNORMAL LOW (ref 4.22–5.81)

## 2011-10-23 LAB — PROTIME-INR: Prothrombin Time: 15.4 seconds — ABNORMAL HIGH (ref 11.6–15.2)

## 2011-10-23 MED ORDER — TERAZOSIN HCL 10 MG PO CAPS: 10.0000 mg | ORAL_CAPSULE | Freq: Every day | ORAL | Status: DC

## 2011-10-23 MED ORDER — RAMIPRIL 10 MG PO CAPS: 10.0000 mg | ORAL_CAPSULE | Freq: Every day | ORAL | Status: DC

## 2011-10-23 MED ORDER — SODIUM CHLORIDE 0.65 % NA SOLN: 2.0000 | Freq: Every day | NASAL | Status: DC

## 2011-10-23 MED ORDER — DOXYLAMINE SUCCINATE (SLEEP) 25 MG PO TABS: 25.0000 mg | ORAL_TABLET | Freq: Every evening | ORAL | Status: DC | PRN

## 2011-10-23 NOTE — Progress Notes (Signed)
Patient and spouse given discharge instructions. Questions/concerns answered and addressed. Pt verbalized how to take new nasal spray. No personal belongings in drawers or cabinets. Will continue to monitor until ride gets here. Vital signs stable. IV discontinued and site unremarkable.

## 2011-10-23 NOTE — Discharge Summary (Signed)
DISCHARGE SUMMARY  Garrett Bradley  MR#: [id number]  DOB:08-Nov-1934  Date of Admission: 10/19/2011 Date of Discharge: 10/23/2011  Attending Physician:Kittie Krizan I.  Patient's WUJ:WJXBJYN,WGNF [name], MD  Consults :Treatment Team:  Antony Contras  Discharge Diagnoses: 1-the current right posterior nose epistaxis resolve 2-syncope felt to be secondary to symptomatic anemia, hypotension, and active bleeding, patient kept on telemetry monitor without fatal event  3-acute blood loss anemia status post blood transfusion  4-acute renal failure resolved  5-hypertension  6-Hypokalemia 7- prostate hypertrophy status post TURP  8-diabetes mellitus on oral hypoglycemic  9-hypothyroidism  10-history of depression  Current Discharge Medication List    START taking these medications   Details  sodium chloride (OCEAN) 0.65 % nasal spray Place 2 sprays into the nose 6 (six) times daily. Qty: 15 mL, Refills: 12      CONTINUE these medications which have CHANGED   Details  doxylamine, Sleep, (UNISOM) 25 MG tablet Take 1 tablet (25 mg total) by mouth at bedtime as needed. For sleep Qty: 10 tablet, Refills: 0    ramipril (ALTACE) 10 MG capsule Take 1 capsule (10 mg total) by mouth daily. Qty: 30 capsule, Refills: 0    terazosin (HYTRIN) 10 MG capsule Take 1 capsule (10 mg total) by mouth at bedtime. Qty: 30 capsule, Refills: 0      CONTINUE these medications which have NOT CHANGED   Details  ALPRAZolam (XANAX) 1 MG tablet Take 0.5 tablets (0.5 mg total) by mouth 3 (three) times daily as needed for anxiety. For anxiety Qty: 30 tablet    ferrous sulfate (FERROUSUL) 325 (65 FE) MG tablet Take 1 tablet (325 mg total) by mouth 3 (three) times daily with meals. Qty: 90 tablet, Refills: 11    fish oil-omega-3 fatty acids 1000 MG capsule Take 1 capsule (1 g total) by mouth 2 (two) times daily.    glimepiride (AMARYL) 4 MG tablet Take 4 mg by mouth every morning.     levothyroxine  (SYNTHROID, LEVOTHROID) 50 MCG tablet Take 1 tablet (50 mcg total) by mouth daily.    niacin (NIASPAN) 500 MG CR tablet Take 500 mg by mouth at bedtime.      omeprazole (PRILOSEC) 20 MG capsule Take 1 capsule (20 mg total) by mouth daily. Qty: 14 capsule, Refills: 0    potassium chloride (KLOR-CON) 20 MEQ packet Take 20 mEq by mouth daily.      Travoprost, BAK Free, (TRAVATAMN) 0.004 % SOLN ophthalmic solution Place 1 drop into both eyes at bedtime.            Hospital Course:  this is a 77 year old male with history of hypertension, diabetes mellitus recently discharged from the hospital on on November 9 after developed epistaxis, he is supposed to follow up with Dr. Pollyann Kennedy but he stated his bleeding from the right nose continue, he presented to the office he passed out, and accordingly procedure cancelled, the patient transferred to [facility] for further evaluation .patient kept on telemetry monitor without event, hemaglobin dropped and patient  transfuse 1 unit of packed RBCs , echo done which did show EF of 50-55%  with normal wall motion, cardiac enzymes monitored which were negative, no new deficit to suspect any stroke, accordingly the cause of patient's syncope felt to be secondary to symptomatic anemia, hyportension as the patient was on oral hypotensive medications, which was DC'd. Patient blood pressure improved and medication gradually started, patient placed on iron supplement, discussed with Dr. Jenne Pane and we and agree  and cleared the patient to proceed with nasal cauterization and anesthesia, but as an infant needing to stop Dr. Jenne Pane for just to remove the nasal pack and watch the patient for 24-hour for any evidence of recurrent bleeding. Patient kept a #24 hours was no evidence of recurrent epistaxis, patient   advised to followup with Dr. Jenne Pane on Tuesday and report to the hospital if any further epistaxis 2-acute blood loss anemia status post 1 unit of blood transfusion and  grooming currently at 8.6 and patient is asymptomatic  3-acute renal failure resolved  4-hypokalemia replacement Currently the patient has no further epistaxis, nasal packing removed, his vital signs remained stable, blood pressures start to pick up and accordingly would resume his medications, at smaller dose and patient was advised to stop taking them if epistaxis recurr. Patient will follow up with Dr. Jenne Pane on Tuesday.    Day of Discharge BP 154/80  Pulse 88  Temp(Src) 97.6 F (36.4 C) (Oral)  Resp 20  Ht 6\' 4"  (1.93 m)  Wt 121 kg (266 lb 12.1 oz)  BMI 32.47 kg/m2  SpO2 96%  Physical Exam: Patient lying on medical floor telemetry comfortably not on  respiratory distress denies any chest pain or shortness of breath no JVP no Rales no wheezing, right nares with discoloration, no active blood, heart S1 and S2 with no added sounds, abdomen soft nontender bowel sounds present, lower extremity with chronic venous stasis, no edema, neurologic : patient awake alert oriented with no focal neurologic deficit   Results for orders placed during the hospital encounter of 10/19/11 (from the past 24 hour(s))  GLUCOSE, CAPILLARY     Status: Abnormal   Collection Time   10/22/11 11:41 AM      Component Value Range   Glucose-Capillary 152 (*) 70 - 99 (mg/dL)  GLUCOSE, CAPILLARY     Status: Abnormal   Collection Time   10/22/11  4:49 PM      Component Value Range   Glucose-Capillary 143 (*) 70 - 99 (mg/dL)   Comment 1 Notify RN    GLUCOSE, CAPILLARY     Status: Abnormal   Collection Time   10/22/11  8:35 PM      Component Value Range   Glucose-Capillary 162 (*) 70 - 99 (mg/dL)   Comment 1 Notify RN    CBC     Status: Abnormal   Collection Time   10/23/11  6:05 AM      Component Value Range   WBC 5.6  4.0 - 10.5 (K/uL)   RBC 2.89 (*) 4.22 - 5.81 (MIL/uL)   Hemoglobin 8.5 (*) 13.0 - 17.0 (g/dL)   HCT 11.9 (*) 14.7 - 52.0 (%)   MCV 91.0  78.0 - 100.0 (fL)   MCH 29.4  26.0 - 34.0 (pg)    MCHC 32.3  30.0 - 36.0 (g/dL)   RDW 82.9 (*) 56.2 - 15.5 (%)   Platelets 161  150 - 400 (K/uL)  PROTIME-INR     Status: Abnormal   Collection Time   10/23/11  6:05 AM      Component Value Range   Prothrombin Time 15.4 (*) 11.6 - 15.2 (seconds)   INR 1.19  0.00 - 1.49   GLUCOSE, CAPILLARY     Status: Abnormal   Collection Time   10/23/11  7:21 AM      Component Value Range   Glucose-Capillary 110 (*) 70 - 99 (mg/dL)    Disposition: home with wife   Follow-up Appts:  Discharge Orders    Future Orders Please Complete By Expires   Diet - low sodium heart healthy      Increase activity slowly      Call MD for:      Scheduling Instructions:   Please contact DR bates immediately if  Nose bleeding recurred, also stop taking ramipril and terazosin if bleeding recurred, till you see MD      Follow-up with Dr.Bates in 5 days . tuesday    Tests Needing Follow-up:  CBC WITH DR Jenne Pane, OR MD  Signed: Ebony Cargo I. 10/23/2011, 10:26 AM

## 2013-02-12 ENCOUNTER — Emergency Department (HOSPITAL_COMMUNITY): Payer: Medicare Other

## 2013-02-12 ENCOUNTER — Inpatient Hospital Stay (HOSPITAL_COMMUNITY)
Admission: EM | Admit: 2013-02-12 | Discharge: 2013-02-14 | DRG: 603 | Disposition: A | Discharge status: Home / Self Care | Payer: Medicare Other | Attending: Internal Medicine | Admitting: Internal Medicine

## 2013-02-12 ENCOUNTER — Encounter (HOSPITAL_COMMUNITY): Payer: Self-pay

## 2013-02-12 DIAGNOSIS — L02419 Cutaneous abscess of limb, unspecified: Principal | ICD-10-CM | POA: Diagnosis present

## 2013-02-12 DIAGNOSIS — E1165 Type 2 diabetes mellitus with hyperglycemia: Secondary | ICD-10-CM

## 2013-02-12 DIAGNOSIS — E119 Type 2 diabetes mellitus without complications: Secondary | ICD-10-CM | POA: Diagnosis present

## 2013-02-12 DIAGNOSIS — N184 Chronic kidney disease, stage 4 (severe): Secondary | ICD-10-CM | POA: Diagnosis present

## 2013-02-12 DIAGNOSIS — I129 Hypertensive chronic kidney disease with stage 1 through stage 4 chronic kidney disease, or unspecified chronic kidney disease: Secondary | ICD-10-CM | POA: Diagnosis present

## 2013-02-12 DIAGNOSIS — Z79899 Other long term (current) drug therapy: Secondary | ICD-10-CM

## 2013-02-12 DIAGNOSIS — N289 Disorder of kidney and ureter, unspecified: Secondary | ICD-10-CM

## 2013-02-12 DIAGNOSIS — F329 Major depressive disorder, single episode, unspecified: Secondary | ICD-10-CM | POA: Diagnosis present

## 2013-02-12 DIAGNOSIS — I10 Essential (primary) hypertension: Secondary | ICD-10-CM | POA: Diagnosis present

## 2013-02-12 DIAGNOSIS — IMO0001 Reserved for inherently not codable concepts without codable children: Secondary | ICD-10-CM | POA: Diagnosis present

## 2013-02-12 DIAGNOSIS — D649 Anemia, unspecified: Secondary | ICD-10-CM | POA: Diagnosis present

## 2013-02-12 DIAGNOSIS — D638 Anemia in other chronic diseases classified elsewhere: Secondary | ICD-10-CM | POA: Diagnosis present

## 2013-02-12 DIAGNOSIS — E876 Hypokalemia: Secondary | ICD-10-CM | POA: Diagnosis not present

## 2013-02-12 DIAGNOSIS — R112 Nausea with vomiting, unspecified: Secondary | ICD-10-CM

## 2013-02-12 DIAGNOSIS — L03115 Cellulitis of right lower limb: Secondary | ICD-10-CM | POA: Diagnosis present

## 2013-02-12 DIAGNOSIS — N179 Acute kidney failure, unspecified: Secondary | ICD-10-CM | POA: Diagnosis present

## 2013-02-12 DIAGNOSIS — I872 Venous insufficiency (chronic) (peripheral): Secondary | ICD-10-CM | POA: Diagnosis present

## 2013-02-12 DIAGNOSIS — Z6841 Body Mass Index (BMI) 40.0 and over, adult: Secondary | ICD-10-CM

## 2013-02-12 DIAGNOSIS — Z9079 Acquired absence of other genital organ(s): Secondary | ICD-10-CM

## 2013-02-12 DIAGNOSIS — E1122 Type 2 diabetes mellitus with diabetic chronic kidney disease: Secondary | ICD-10-CM

## 2013-02-12 DIAGNOSIS — R040 Epistaxis: Secondary | ICD-10-CM

## 2013-02-12 DIAGNOSIS — R55 Syncope and collapse: Secondary | ICD-10-CM

## 2013-02-12 DIAGNOSIS — N400 Enlarged prostate without lower urinary tract symptoms: Secondary | ICD-10-CM

## 2013-02-12 DIAGNOSIS — Z794 Long term (current) use of insulin: Secondary | ICD-10-CM

## 2013-02-12 DIAGNOSIS — L03116 Cellulitis of left lower limb: Secondary | ICD-10-CM | POA: Diagnosis present

## 2013-02-12 DIAGNOSIS — E039 Hypothyroidism, unspecified: Secondary | ICD-10-CM | POA: Diagnosis present

## 2013-02-12 DIAGNOSIS — F3289 Other specified depressive episodes: Secondary | ICD-10-CM | POA: Diagnosis present

## 2013-02-12 DIAGNOSIS — N189 Chronic kidney disease, unspecified: Secondary | ICD-10-CM | POA: Diagnosis present

## 2013-02-12 LAB — BASIC METABOLIC PANEL
BUN: 25 mg/dL — ABNORMAL HIGH (ref 6–23)
Chloride: 100 mEq/L (ref 96–112)
Creatinine, Ser: 1.6 mg/dL — ABNORMAL HIGH (ref 0.50–1.35)
GFR calc Af Amer: 46 mL/min — ABNORMAL LOW (ref >90)
GFR calc non Af Amer: 40 mL/min — ABNORMAL LOW (ref >90)
Potassium: 3.1 mEq/L — ABNORMAL LOW (ref 3.5–5.1)

## 2013-02-12 LAB — CBC WITH DIFFERENTIAL/PLATELET
Basophils Absolute: 0 10*3/uL (ref 0.0–0.1)
Basophils Relative: 0 % (ref 0–1)
HCT: 35.6 % — ABNORMAL LOW (ref 39.0–52.0)
Hemoglobin: 11.6 g/dL — ABNORMAL LOW (ref 13.0–17.0)
Lymphocytes Relative: 20 % (ref 12–46)
MCHC: 32.6 g/dL (ref 30.0–36.0)
Neutro Abs: 4.9 10*3/uL (ref 1.7–7.7)
Neutrophils Relative %: 70 % (ref 43–77)
RDW: 13.9 % (ref 11.5–15.5)
WBC: 7 10*3/uL (ref 4.0–10.5)

## 2013-02-12 LAB — PRO B NATRIURETIC PEPTIDE: Pro B Natriuretic peptide (BNP): 141 pg/mL (ref 0–450)

## 2013-02-12 MED ORDER — POTASSIUM CHLORIDE 20 MEQ/15ML (10%) PO LIQD
40.0000 meq | Freq: Once | ORAL | Status: AC
  Administered 2013-02-12: 40 meq via ORAL
  Filled 2013-02-12: qty 30

## 2013-02-12 MED ORDER — VANCOMYCIN HCL IN DEXTROSE 1-5 GM/200ML-% IV SOLN
1000.0000 mg | Freq: Once | INTRAVENOUS | Status: AC
  Administered 2013-02-12: 1000 mg via INTRAVENOUS
  Filled 2013-02-12: qty 200

## 2013-02-12 NOTE — ED Provider Notes (Signed)
History     CSN: [id number]  Arrival date & time 02/12/13  4098   First MD Initiated Contact with Patient 02/12/13 1945      Chief Complaint  Patient presents with  . Leg Swelling      HPI Pt was seen at 1950.   Per pt, c/o gradual onset and worsening of persistent bilat LE's "swelling" and "redness" for the past 3 days.  Pt states the legs edema began 2 weeks ago but the redness began 3 days ago.  Pt's wife feels the redness is "going up his legs" and "getting worse."  States his CBG's were reading "high," but he was eval by his PMD and "had my insulin adjusted."  States his CBG's are now in "the 200's."  Denies fevers, no abd pain, no N/V/D, no back pain, no legs pain, no other areas of rash.     Past Medical History  Diagnosis Date  . Hypertension   . Diabetes mellitus   . Hypothyroidism   . Depression     Past Surgical History  Procedure Laterality Date  . Hernia repair    . Transurethral resection of prostate    . Transurethral resection of prostate  09/22/2011    Procedure: TRANSURETHRAL RESECTION OF THE PROSTATE (TURP);  Surgeon: Ky Barban;  Location: [facility];  Service: Urology;  Laterality: N/A;      History  Substance Use Topics  . Smoking status: Never Smoker   . Smokeless tobacco: Never Used  . Alcohol Use: No    Review of Systems ROS: Statement: All systems negative except as marked or noted in the HPI; Constitutional: Negative for fever and chills. ; ; Eyes: Negative for eye pain, redness and discharge. ; ; ENMT: Negative for ear pain, hoarseness, nasal congestion, sinus pressure and sore throat. ; ; Cardiovascular: Negative for chest pain, palpitations, diaphoresis, dyspnea and +peripheral edema. ; ; Respiratory: Negative for cough, wheezing and stridor. ; ; Gastrointestinal: Negative for nausea, vomiting, diarrhea, abdominal pain, blood in stool, hematemesis, jaundice and rectal bleeding. . ; ; Genitourinary: Negative for dysuria, flank pain and  hematuria. ; ; Musculoskeletal: Negative for back pain and neck pain. Negative for swelling and trauma.; ; Skin: +rash. Negative for pruritus, abrasions, blisters, bruising and skin lesion.; ; Neuro: Negative for headache, lightheadedness and neck stiffness. Negative for weakness, altered level of consciousness , altered mental status, extremity weakness, paresthesias, involuntary movement, seizure and syncope.       Allergies  Review of patient's allergies indicates no known allergies.  Home Medications   Current Outpatient Rx  Name  Route  Sig  Dispense  Refill  . ALPRAZolam (XANAX) 1 MG tablet   Oral   Take 1 mg by mouth at bedtime. For anxiety         . diltiazem (CARDIZEM CD) 300 MG 24 hr capsule   Oral   Take 300 mg by mouth every morning.         . ferrous sulfate 325 (65 FE) MG tablet   Oral   Take 325 mg by mouth every morning.         . fish oil-omega-3 fatty acids 1000 MG capsule   Oral   Take 1 capsule (1 g total) by mouth 2 (two) times daily.         . hydrochlorothiazide (HYDRODIURIL) 25 MG tablet   Oral   Take 25 mg by mouth 2 (two) times daily.         Marland Kitchen  insulin detemir (LEVEMIR FLEXPEN) 100 UNIT/ML injection   Subcutaneous   Inject 80 Units into the skin at bedtime.         . insulin lispro (HUMALOG KWIKPEN) 100 UNIT/ML injection   Subcutaneous   Inject 25 Units into the skin 3 (three) times daily before meals.         Marland Kitchen levothyroxine (SYNTHROID, LEVOTHROID) 100 MCG tablet   Oral   Take 100 mcg by mouth every morning.         . linagliptin (TRADJENTA) 5 MG TABS tablet   Oral   Take 5 mg by mouth every morning.         . niacin 500 MG tablet   Oral   Take 500 mg by mouth at bedtime.         . potassium chloride (K-DUR) 10 MEQ tablet   Oral   Take 10 mEq by mouth every morning.         . ramipril (ALTACE) 10 MG capsule   Oral   Take 10 mg by mouth 2 (two) times daily.         Marland Kitchen terazosin (HYTRIN) 10 MG capsule    Oral   Take 1 capsule (10 mg total) by mouth at bedtime.   30 capsule   0   . Travoprost, BAK Free, (TRAVATAMN) 0.004 % SOLN ophthalmic solution   Both Eyes   Place 1 drop into both eyes at bedtime.             BP 188/80  Pulse 95  Temp(Src) 98.4 F (36.9 C) (Oral)  Resp 16  Ht 6\' 4"  (1.93 m)  Wt 340 lb (154.223 kg)  BMI 41.4 kg/m2  SpO2 100%  Physical Exam 1955: Physical examination:  Nursing notes reviewed; Vital signs and O2 SAT reviewed;  Constitutional: Well developed, Well nourished, Well hydrated, In no acute distress; Head:  Normocephalic, atraumatic; Eyes: EOMI, PERRL, No scleral icterus; ENMT: Mouth and pharynx normal, Mucous membranes moist; Neck: Supple, Full range of motion, No lymphadenopathy; Cardiovascular: Regular rate and rhythm, No murmur, rub, or gallop; Respiratory: Breath sounds clear & equal bilaterally, No rales, rhonchi, wheezes.  Speaking full sentences with ease, Normal respiratory effort/excursion; Chest: Nontender, Movement normal; Abdomen: Soft, Nontender, Nondistended, Normal bowel sounds;; Extremities: Pulses normal, No tenderness, 2+ pedal edema bilat feet to knees with chronic stasis changes and overlying erythema to bilat anterior tibial areas.  Erythema on left lower leg extends proximally to lateral left knee. No rash or edema to left knee. No calf asymmetry.; Neuro: AA&Ox3, Major CN grossly intact.  Speech clear. No gross focal motor or sensory deficits in extremities.; Skin: Color normal, Warm, Dry.   ED Course  Procedures     MDM  MDM Reviewed: previous chart, nursing note and vitals Reviewed previous: labs Interpretation: labs and x-ray     Results for orders placed during the hospital encounter of 02/12/13  BASIC METABOLIC PANEL      Result Value Range   Sodium 140  135 - 145 mEq/L   Potassium 3.1 (*) 3.5 - 5.1 mEq/L   Chloride 100  96 - 112 mEq/L   CO2 31  19 - 32 mEq/L   Glucose, Bld 222 (*) 70 - 99 mg/dL   BUN 25 (*) 6 - 23  mg/dL   Creatinine, Ser 4.09 (*) 0.50 - 1.35 mg/dL   Calcium 9.5  8.4 - 81.1 mg/dL   GFR calc non Af Amer 40 (*) >90 mL/min  GFR calc Af Amer 46 (*) >90 mL/min  CBC WITH DIFFERENTIAL      Result Value Range   WBC 7.0  4.0 - 10.5 K/uL   RBC 4.03 (*) 4.22 - 5.81 MIL/uL   Hemoglobin 11.6 (*) 13.0 - 17.0 g/dL   HCT 16.1 (*) 09.6 - 04.5 %   MCV 88.3  78.0 - 100.0 fL   MCH 28.8  26.0 - 34.0 pg   MCHC 32.6  30.0 - 36.0 g/dL   RDW 40.9  81.1 - 91.4 %   Platelets 171  150 - 400 K/uL   Neutrophils Relative 70  43 - 77 %   Neutro Abs 4.9  1.7 - 7.7 K/uL   Lymphocytes Relative 20  12 - 46 %   Lymphs Abs 1.4  0.7 - 4.0 K/uL   Monocytes Relative 6  3 - 12 %   Monocytes Absolute 0.4  0.1 - 1.0 K/uL   Eosinophils Relative 4  0 - 5 %   Eosinophils Absolute 0.3  0.0 - 0.7 K/uL   Basophils Relative 0  0 - 1 %   Basophils Absolute 0.0  0.0 - 0.1 K/uL  LACTIC ACID, PLASMA      Result Value Range   Lactic Acid, Venous 1.7  0.5 - 2.2 mmol/L  PRO B NATRIURETIC PEPTIDE      Result Value Range   Pro B Natriuretic peptide (BNP) 141.0  0 - 450 pg/mL   Dg Chest 2 View 02/12/2013  *RADIOLOGY REPORT*  Clinical Data: Lower extremity swelling.  CHEST - 2 VIEW  Comparison: PA and lateral chest 10/19/2011.  Findings: There is cardiomegaly without edema.  No focal airspace disease is seen.  Study is limited by the patient's body habitus.  IMPRESSION: Cardiomegaly without acute disease.   Original Report Authenticated By: Holley Dexter, M.D.    Results for Garrett, Bradley (MRN [id number]) as of 02/12/2013 21:28  Ref. Range 10/20/2011 05:43 10/20/2011 09:02 10/21/2011 09:45 02/12/2013 20:15  BUN Latest Range: 6-23 mg/dL 16 15 13 25  (H)  Creatinine Latest Range: 0.50-1.35 mg/dL 0.86 5.78 4.69 6.29 (H)   Results for Garrett, Bradley (MRN [id number]) as of 02/12/2013 21:28  Ref. Range 10/20/2011 16:34 10/21/2011 09:45 10/22/2011 05:20 10/23/2011 06:05 02/12/2013 20:15  Hemoglobin Latest Range: 13.0-17.0 g/dL 7.7 (L) 9.5  (L) 8.6 (L) 8.5 (L) 11.6 (L)  HCT Latest Range: 39.0-52.0 % 23.2 (L) 28.7 (L) 26.3 (L) 26.3 (L) 35.6 (L)     2120:  BUN/Cr elevated from baseline.  H/H stable.  Hyperglycemic with hx of same, no acidotic with AG 9.  Will start IV vancomycin for cellulitis bilat LE's.  Will replete potassium PO.  Dx and testing d/w pt and family.  Questions answered.  Verb understanding, agreeable to observation admit. T/C to [facility] Dr. Orvan Falconer, case discussed, including:  HPI, pertinent PM/SHx, VS/PE, dx testing, ED course and treatment:  Agreeable to observation admit, requests to write temporary orders, obtain medical bed to team 1.          Laray Anger, DO 02/14/13 1250

## 2013-02-12 NOTE — ED Notes (Signed)
Pt complains of bilateral leg swelling x2 weeks, states he has been up on his feet a lot lately shoveling snow. Both legs considerably swollen with 2+ pitting edema up to the knee. Pt denies SOB, lung sounds clear, no other complaints, denies cardiac history. AAx4 NAD.

## 2013-02-12 NOTE — ED Notes (Signed)
My legs are swelling, started about 3 weeks ago per pt. Went to see my family doctor about it and he raised my insulin and told me I needed to exercise more per pt.

## 2013-02-13 DIAGNOSIS — E119 Type 2 diabetes mellitus without complications: Secondary | ICD-10-CM | POA: Diagnosis present

## 2013-02-13 DIAGNOSIS — L02419 Cutaneous abscess of limb, unspecified: Principal | ICD-10-CM

## 2013-02-13 DIAGNOSIS — E039 Hypothyroidism, unspecified: Secondary | ICD-10-CM

## 2013-02-13 DIAGNOSIS — N189 Chronic kidney disease, unspecified: Secondary | ICD-10-CM | POA: Diagnosis present

## 2013-02-13 LAB — BASIC METABOLIC PANEL
GFR calc Af Amer: 48 mL/min — ABNORMAL LOW (ref >90)
GFR calc non Af Amer: 42 mL/min — ABNORMAL LOW (ref >90)
Potassium: 3 mEq/L — ABNORMAL LOW (ref 3.5–5.1)
Sodium: 141 mEq/L (ref 135–145)

## 2013-02-13 LAB — URINALYSIS, ROUTINE W REFLEX MICROSCOPIC
Leukocytes, UA: NEGATIVE
Nitrite: NEGATIVE
Specific Gravity, Urine: 1.015 (ref 1.005–1.030)
Urobilinogen, UA: 0.2 mg/dL (ref 0.0–1.0)

## 2013-02-13 LAB — CBC
Hemoglobin: 10.9 g/dL — ABNORMAL LOW (ref 13.0–17.0)
MCHC: 32.8 g/dL (ref 30.0–36.0)
RBC: 3.74 MIL/uL — ABNORMAL LOW (ref 4.22–5.81)

## 2013-02-13 LAB — BLOOD GAS, ARTERIAL
Acid-Base Excess: 8.6 mmol/L — ABNORMAL HIGH (ref 0.0–2.0)
pCO2 arterial: 50.6 mmHg — ABNORMAL HIGH (ref 35.0–45.0)
pH, Arterial: 7.432 (ref 7.350–7.450)
pO2, Arterial: 65.6 mmHg — ABNORMAL LOW (ref 80.0–100.0)

## 2013-02-13 LAB — GLUCOSE, CAPILLARY
Glucose-Capillary: 180 mg/dL — ABNORMAL HIGH (ref 70–99)
Glucose-Capillary: 219 mg/dL — ABNORMAL HIGH (ref 70–99)
Glucose-Capillary: 232 mg/dL — ABNORMAL HIGH (ref 70–99)
Glucose-Capillary: 200 mg/dL — ABNORMAL HIGH (ref 70–99)

## 2013-02-13 LAB — HEMOGLOBIN A1C
Hgb A1c MFr Bld: 10.6 % — ABNORMAL HIGH (ref <5.7)
Mean Plasma Glucose: 258 mg/dL — ABNORMAL HIGH (ref <117)

## 2013-02-13 LAB — HEPATIC FUNCTION PANEL
ALT: 18 U/L (ref 0–53)
AST: 20 U/L (ref 0–37)
Total Protein: 7.5 g/dL (ref 6.0–8.3)

## 2013-02-13 MED ORDER — INSULIN DETEMIR 100 UNIT/ML ~~LOC~~ SOLN
80.0000 [IU] | Freq: Every day | SUBCUTANEOUS | Status: DC
  Administered 2013-02-13 (×2): 80 [IU] via SUBCUTANEOUS
  Filled 2013-02-13 (×2): qty 10

## 2013-02-13 MED ORDER — RAMIPRIL 10 MG PO CAPS
10.0000 mg | ORAL_CAPSULE | Freq: Two times a day (BID) | ORAL | Status: DC
  Administered 2013-02-13 (×2): 10 mg via ORAL
  Filled 2013-02-13 (×3): qty 1

## 2013-02-13 MED ORDER — SORBITOL 70 % SOLN: 30.0000 mL | Freq: Every day | Status: DC | PRN

## 2013-02-13 MED ORDER — VANCOMYCIN HCL 10 G IV SOLR
1500.0000 mg | Freq: Two times a day (BID) | INTRAVENOUS | Status: DC
  Filled 2013-02-13: qty 1500

## 2013-02-13 MED ORDER — PIPERACILLIN-TAZOBACTAM 3.375 G IVPB
INTRAVENOUS | Status: AC
  Filled 2013-02-13: qty 50

## 2013-02-13 MED ORDER — ONDANSETRON HCL 4 MG/2ML IJ SOLN: 4.0000 mg | INTRAMUSCULAR | Status: DC | PRN

## 2013-02-13 MED ORDER — FUROSEMIDE 10 MG/ML IJ SOLN
40.0000 mg | Freq: Once | INTRAMUSCULAR | Status: AC
  Administered 2013-02-13: 40 mg via INTRAVENOUS
  Filled 2013-02-13: qty 4

## 2013-02-13 MED ORDER — ENOXAPARIN SODIUM 30 MG/0.3ML ~~LOC~~ SOLN
30.0000 mg | SUBCUTANEOUS | Status: DC
  Administered 2013-02-13: 30 mg via SUBCUTANEOUS
  Filled 2013-02-13 (×2): qty 0.3

## 2013-02-13 MED ORDER — LEVOTHYROXINE SODIUM 100 MCG PO TABS
100.0000 ug | ORAL_TABLET | Freq: Every day | ORAL | Status: DC
  Administered 2013-02-13 – 2013-02-14 (×2): 100 ug via ORAL
  Filled 2013-02-13 (×2): qty 1

## 2013-02-13 MED ORDER — ALPRAZOLAM 1 MG PO TABS
1.0000 mg | ORAL_TABLET | Freq: Every day | ORAL | Status: DC
  Administered 2013-02-13 (×2): 1 mg via ORAL
  Filled 2013-02-13 (×2): qty 1

## 2013-02-13 MED ORDER — DILTIAZEM HCL ER COATED BEADS 180 MG PO CP24
300.0000 mg | ORAL_CAPSULE | Freq: Every morning | ORAL | Status: DC
  Administered 2013-02-13 – 2013-02-14 (×2): 300 mg via ORAL
  Filled 2013-02-13 (×2): qty 1

## 2013-02-13 MED ORDER — POTASSIUM CHLORIDE CRYS ER 10 MEQ PO TBCR
10.0000 meq | EXTENDED_RELEASE_TABLET | Freq: Every morning | ORAL | Status: DC
  Administered 2013-02-13 – 2013-02-14 (×2): 10 meq via ORAL
  Filled 2013-02-13 (×3): qty 1

## 2013-02-13 MED ORDER — NIACIN 500 MG PO TABS
ORAL_TABLET | ORAL | Status: AC
  Filled 2013-02-13: qty 1

## 2013-02-13 MED ORDER — TERAZOSIN HCL 5 MG PO CAPS
10.0000 mg | ORAL_CAPSULE | Freq: Every day | ORAL | Status: DC
  Administered 2013-02-13 (×2): 10 mg via ORAL
  Filled 2013-02-13: qty 1
  Filled 2013-02-13: qty 2
  Filled 2013-02-13: qty 1
  Filled 2013-02-13: qty 2

## 2013-02-13 MED ORDER — NIACIN 500 MG PO TABS
500.0000 mg | ORAL_TABLET | Freq: Every day | ORAL | Status: DC
  Administered 2013-02-13 (×2): 500 mg via ORAL
  Filled 2013-02-13 (×3): qty 1

## 2013-02-13 MED ORDER — INSULIN ASPART 100 UNIT/ML ~~LOC~~ SOLN: 0.0000 [IU] | Freq: Every day | SUBCUTANEOUS | Status: DC

## 2013-02-13 MED ORDER — RAMIPRIL 10 MG PO CAPS
ORAL_CAPSULE | ORAL | Status: AC
  Filled 2013-02-13: qty 1

## 2013-02-13 MED ORDER — ENOXAPARIN SODIUM 80 MG/0.8ML ~~LOC~~ SOLN
70.0000 mg | SUBCUTANEOUS | Status: DC
  Administered 2013-02-14: 70 mg via SUBCUTANEOUS
  Filled 2013-02-13: qty 0.8

## 2013-02-13 MED ORDER — POTASSIUM CHLORIDE CRYS ER 20 MEQ PO TBCR
40.0000 meq | EXTENDED_RELEASE_TABLET | Freq: Once | ORAL | Status: AC
  Administered 2013-02-13: 40 meq via ORAL
  Filled 2013-02-13: qty 2

## 2013-02-13 MED ORDER — INSULIN ASPART 100 UNIT/ML ~~LOC~~ SOLN
0.0000 [IU] | Freq: Three times a day (TID) | SUBCUTANEOUS | Status: DC
  Administered 2013-02-13: 5 [IU] via SUBCUTANEOUS
  Administered 2013-02-13: 3 [IU] via SUBCUTANEOUS
  Administered 2013-02-13: 5 [IU] via SUBCUTANEOUS
  Administered 2013-02-14: 8 [IU] via SUBCUTANEOUS
  Administered 2013-02-14: 3 [IU] via SUBCUTANEOUS

## 2013-02-13 MED ORDER — PIPERACILLIN-TAZOBACTAM 3.375 G IVPB
3.3750 g | Freq: Three times a day (TID) | INTRAVENOUS | Status: DC
  Administered 2013-02-13 – 2013-02-14 (×5): 3.375 g via INTRAVENOUS
  Filled 2013-02-13 (×5): qty 50

## 2013-02-13 MED ORDER — ACETAMINOPHEN 325 MG PO TABS: 650.0000 mg | ORAL_TABLET | ORAL | Status: DC | PRN

## 2013-02-13 MED ORDER — LINAGLIPTIN 5 MG PO TABS
5.0000 mg | ORAL_TABLET | Freq: Every morning | ORAL | Status: DC
  Administered 2013-02-13 – 2013-02-14 (×2): 5 mg via ORAL
  Filled 2013-02-13 (×2): qty 1

## 2013-02-13 MED ORDER — TRAVOPROST (BAK FREE) 0.004 % OP SOLN
1.0000 [drp] | Freq: Every day | OPHTHALMIC | Status: DC
  Administered 2013-02-13 (×2): 1 [drp] via OPHTHALMIC
  Filled 2013-02-13: qty 2.5

## 2013-02-13 MED ORDER — OMEGA-3-ACID ETHYL ESTERS 1 G PO CAPS
1.0000 | ORAL_CAPSULE | Freq: Two times a day (BID) | ORAL | Status: DC
  Administered 2013-02-13 – 2013-02-14 (×4): 1 g via ORAL
  Filled 2013-02-13 (×5): qty 1

## 2013-02-13 MED ORDER — INSULIN DETEMIR 100 UNIT/ML ~~LOC~~ SOLN
SUBCUTANEOUS | Status: AC
  Filled 2013-02-13: qty 10

## 2013-02-13 MED ORDER — VANCOMYCIN HCL 10 G IV SOLR
1250.0000 mg | Freq: Two times a day (BID) | INTRAVENOUS | Status: DC
  Administered 2013-02-13 – 2013-02-14 (×3): 1250 mg via INTRAVENOUS
  Filled 2013-02-13 (×3): qty 1250

## 2013-02-13 MED ORDER — VANCOMYCIN HCL IN DEXTROSE 1-5 GM/200ML-% IV SOLN
INTRAVENOUS | Status: AC
  Filled 2013-02-13: qty 200

## 2013-02-13 MED ORDER — VANCOMYCIN HCL IN DEXTROSE 1-5 GM/200ML-% IV SOLN
1000.0000 mg | Freq: Once | INTRAVENOUS | Status: AC
  Administered 2013-02-13: 1000 mg via INTRAVENOUS
  Filled 2013-02-13: qty 200

## 2013-02-13 NOTE — Progress Notes (Signed)
[facility] PROGRESS NOTE  Garrett Bradley:[id number] DOB: 08/12/1934 DOA: 02/12/2013 PCP: Colette Ribas, MD  Assessment/Plan: Bilateral lower extremity cellulitis : somewhat improved this am. Continue Vanc and Zosyn day #1. Afebrile, non-toxic appearing.   Bilateral leg edema with evidence of chronic venous stasis changes: etiology uncertain. Much improved with one dose lasix. Volume status -1.2L. Weight unchanged from admission.  With body habitus and reports orthopnea concern for heart failure. Last echo 11/12 with EF 50-55%. Will obtain echo. Continue strict intake and output, daily weights.    Diabetes type 2 uncontrolled: wife reports on insulin for 2 years. HgA1c pending. CBG 188-191. Continue levimir and SSI.   Acute on Chronic kidney disease stage IV : chart review indicates baseline creatinine 1.2. Currently 1.5 from 1.6 on admission. Likely related to HCTZ at home, ACE inhibitor and IV lasix received in ED. Will continue to hold HCTZ and ACE inhibitor. Monitor closely.   Hypertension: fair control. Holding HCTZ and ACE inhibitor for now due to #4. Continue cardizem. Monitor closely.   Obesity : BMI 42.1kg. Will request nutritional management.   Hypothyroidism: TSH pending. Continue synthroid.   Hyperkalemia: likely related to HCTZ and lasix. Will replete and recheck  Anemia: likely of chronic disease. Chart review indicates current level baseline. Will check anemia panel. No s/sx active bleeding.    Code Status: full Family Communication: wife and daughter at bedside Disposition Plan: home with wife when ready.    Consultants:  none  Procedures:  none  Antibiotics:  Zosyn 02/13/13>>>  Vancomycin 02/13/13>>>  HPI/Subjective: Eyes closed arouses to verbal stimuli. Somewhat lethargic. Reports "legs feeling better and getting smaller"  Objective: Filed Vitals:   02/12/13 2209 02/12/13 2256 02/13/13 0551 02/13/13 0629  BP: 168/93 153/83  160/82   Pulse: 71 69  73  Temp:  98.1 F (36.7 C)  97.8 F (36.6 C)  TempSrc:  Oral  Oral  Resp: 19 20  19   Height:  6\' 4"  (1.93 m)    Weight:  156.718 kg (345 lb 8 oz) 156.355 kg (344 lb 11.2 oz)   SpO2: 97% 95%  93%    Intake/Output Summary (Last 24 hours) at 02/13/13 0901 Last data filed at 02/13/13 0630  Gross per 24 hour  Intake    990 ml  Output   2275 ml  Net  -1285 ml   Filed Weights   02/12/13 1905 02/12/13 2256 02/13/13 0551  Weight: 154.223 kg (340 lb) 156.718 kg (345 lb 8 oz) 156.355 kg (344 lb 11.2 oz)    Exam:   General:  Obese, lethargic, NAD  Cardiovascular: RRR no MGR 1-2+lower extremity edema bilaterally, chronic venous changes lower extremities bilaterally. Bilateral erythema to knees, only slightly warm to touch, non-tender Dime size ulcer lateral aspect left leg small amount clear drainage  Respiratory: normal effort, BSCTAB No rhonchi no crackles  Abdomen: obese, +BS non-tender to palpation, soft  Musculoskeletal: MAE bilateral LEE erythema and edema. Dime size ulcer lateral aspect left leg. Small amount clear drainage   Data Reviewed: Basic Metabolic Panel:  Recent Labs Lab 02/12/13 2015 02/13/13 0520  NA 140 141  K 3.1* 3.0*  CL 100 100  CO2 31 33*  GLUCOSE 222* 195*  BUN 25* 25*  CREATININE 1.60* 1.54*  CALCIUM 9.5 9.3  MG 1.8  --    Liver Function Tests:  Recent Labs Lab 02/12/13 2015  AST 20  ALT 18  ALKPHOS 62  BILITOT 0.2*  PROT 7.5  ALBUMIN  3.1*     CBC:  Recent Labs Lab 02/12/13 2015 02/13/13 0520  WBC 7.0 7.0  NEUTROABS 4.9  --   HGB 11.6* 10.9*  HCT 35.6* 33.2*  MCV 88.3 88.8  PLT 171 172     BNP (last 3 results)  Recent Labs  02/12/13 2015  PROBNP 141.0   CBG:  Recent Labs Lab 02/12/13 2303 02/13/13 0735  GLUCAP 191* 180*       Studies: Dg Chest 2 View  02/12/2013  *RADIOLOGY REPORT*  Clinical Data: Lower extremity swelling.  CHEST - 2 VIEW  Comparison: PA and lateral chest 10/19/2011.   Findings: There is cardiomegaly without edema.  No focal airspace disease is seen.  Study is limited by the patient's body habitus.  IMPRESSION: Cardiomegaly without acute disease.   Original Report Authenticated By: Holley Dexter, M.D.     Scheduled Meds: . ALPRAZolam  1 mg Oral QHS  . diltiazem  300 mg Oral q morning - 10a  . enoxaparin (LOVENOX) injection  30 mg Subcutaneous Q24H  . insulin aspart  0-15 Units Subcutaneous TID WC  . insulin aspart  0-5 Units Subcutaneous QHS  . insulin detemir  80 Units Subcutaneous QHS  . levothyroxine  100 mcg Oral QAC breakfast  . linagliptin  5 mg Oral q morning - 10a  . niacin  500 mg Oral QHS  . omega-3 acid ethyl esters  1 capsule Oral BID  . piperacillin-tazobactam (ZOSYN)  IV  3.375 g Intravenous Q8H  . potassium chloride  10 mEq Oral q morning - 10a  . potassium chloride  40 mEq Oral Once  . terazosin  10 mg Oral QHS  . Travoprost (BAK Free)  1 drop Both Eyes QHS  . vancomycin  1,250 mg Intravenous Q12H   Continuous Infusions:   Active Problems:   Hypothyroidism   Hypertension   Cellulitis of right lower leg   Cellulitis of left lower leg   Chronic kidney disease   Diabetes type 2, uncontrolled   Hypokalemia    Time spent: 45 minutes    [facility] M  [facility]  If 7PM-7AM, please contact night-coverage at [email or web address], password [facility] 02/13/2013, 9:01 AM  LOS: 1 day   Attending: Patient seen and above note reviewed. Agree with management. I do not think is clinical evidence of heart failure. His morbid obesity is the main problem here. I think he is dependent edema/venous insufficiency.

## 2013-02-13 NOTE — H&P (Signed)
[facility] History and Physical  Garrett Bradley  VHQ:[id number]  DOB: 11-05-1934   DOA: 02/13/2013   PCP:   Colette Ribas, MD  Endocrinologist: Dr. Fransico Him  Chief Complaint: Swelling of the legs for 4 weeks   HPI: Garrett Bradley is an 79 y.o. male.   Morbidly obese African American gentleman presents with the above complaint. Denies any history of heart failure or kidney disease. Also notes that he went out trying to move slowly distract to 3 days ago he feels that has something to do with why his legs are now red. He denies any fever or chills; he denies any pain in the legs. Denies trauma to his lower extremity.  He came to the hospital not because of redness of the legs but because the legs were getting so tight they feel very uncomfortable.  He notes he has gained 25 pounds in the past 4-5 months since starting insulin. His fasting blood sugars are typically around 200   Rewiew of Systems:   All systems negative except as marked bold or noted in the HPI;  Constitutional:    malaise, fever and chills. ;  Eyes:   eye pain, redness and discharge. ;  ENMT:   ear pain, hoarseness, nasal congestion, sinus pressure and sore throat. ;  Cardiovascular:    chest pain, palpitations, diaphoresis, dyspnea .  Respiratory:   cough, hemoptysis, wheezing and stridor. ;  Gastrointestinal:  nausea, vomiting, diarrhea, constipation, abdominal pain, melena, blood in stool, hematemesis, jaundice and rectal bleeding. unusual weight loss..   Genitourinary:    frequency, dysuria, incontinence,flank pain and hematuria; Musculoskeletal:   back pain and neck pain.  swelling and trauma.;  Skin: .  pruritus, rash, abrasions, bruising and skin lesion.; ulcerations Neuro:    headache, lightheadedness and neck stiffness.  weakness, altered level of consciousness, altered mental status, extremity weakness, burning feet, involuntary movement, seizure and syncope.  Psych:    anxiety, depression, insomnia,  tearfulness, panic attacks, hallucinations, paranoia, suicidal or homicidal ideation    Past Medical History  Diagnosis Date  . Hypertension   . Diabetes mellitus   . Hypothyroidism   . Depression     Past Surgical History  Procedure Laterality Date  . Hernia repair    . Transurethral resection of prostate    . Transurethral resection of prostate  09/22/2011    Procedure: TRANSURETHRAL RESECTION OF THE PROSTATE (TURP);  Surgeon: Ky Barban;  Location: [facility];  Service: Urology;  Laterality: N/A;    Medications:  HOME MEDS: Prior to Admission medications   Medication Sig Start Date End Date Taking? Authorizing Provider  ALPRAZolam Prudy Feeler) 1 MG tablet Take 1 mg by mouth at bedtime. For anxiety 10/02/11  Yes Christiane Ha, MD  diltiazem (CARDIZEM CD) 300 MG 24 hr capsule Take 300 mg by mouth every morning.   Yes Historical Provider, MD  ferrous sulfate 325 (65 FE) MG tablet Take 325 mg by mouth every morning. 10/17/11 02/12/13 Yes Vassie Loll, MD  fish oil-omega-3 fatty acids 1000 MG capsule Take 1 capsule (1 g total) by mouth 2 (two) times daily. 10/02/11  Yes Christiane Ha, MD  hydrochlorothiazide (HYDRODIURIL) 25 MG tablet Take 25 mg by mouth 2 (two) times daily.   Yes Historical Provider, MD  insulin detemir (LEVEMIR FLEXPEN) 100 UNIT/ML injection Inject 80 Units into the skin at bedtime.   Yes Historical Provider, MD  insulin lispro (HUMALOG KWIKPEN) 100 UNIT/ML injection Inject 25 Units into the  skin 3 (three) times daily before meals.   Yes Historical Provider, MD  levothyroxine (SYNTHROID, LEVOTHROID) 100 MCG tablet Take 100 mcg by mouth every morning.   Yes Historical Provider, MD  linagliptin (TRADJENTA) 5 MG TABS tablet Take 5 mg by mouth every morning.   Yes Historical Provider, MD  niacin 500 MG tablet Take 500 mg by mouth at bedtime.   Yes Historical Provider, MD  potassium chloride (K-DUR) 10 MEQ tablet Take 10 mEq by mouth every morning.   Yes  Historical Provider, MD  ramipril (ALTACE) 10 MG capsule Take 10 mg by mouth 2 (two) times daily. 10/23/11  Yes Hind I Elsaid, MD  terazosin (HYTRIN) 10 MG capsule Take 1 capsule (10 mg total) by mouth at bedtime. 10/23/11  Yes Hind I Elsaid, MD  Travoprost, BAK Free, (TRAVATAMN) 0.004 % SOLN ophthalmic solution Place 1 drop into both eyes at bedtime.     Yes Historical Provider, MD     Allergies:  No Known Allergies  Social History:   reports that he has never smoked. He has never used smokeless tobacco. He reports that he does not drink alcohol or use illicit drugs.  Family History: Family History  Problem Relation Age of Onset  . Hypertension Mother   . Hypertension Brother   . Cerebral aneurysm Mother      Physical Exam: Filed Vitals:   02/12/13 1905 02/12/13 2209 02/12/13 2256  BP: 188/80 168/93 153/83  Pulse: 95 71 69  Temp: 98.4 F (36.9 C)  98.1 F (36.7 C)  TempSrc: Oral  Oral  Resp: 16 19 20   Height: 6\' 4"  (1.93 m)  6\' 4"  (1.93 m)  Weight: 154.223 kg (340 lb)  156.718 kg (345 lb 8 oz)  SpO2: 100% 97% 95%   Blood pressure 153/83, pulse 69, temperature 98.1 F (36.7 C), temperature source Oral, resp. rate 20, height 6\' 4"  (1.93 m), weight 156.718 kg (345 lb 8 oz), SpO2 95.00%.  GEN:  Pleasant obese elderly African American gentleman lying bed in no acute distress; cooperative with exam PSYCH:  alert and oriented x4;  neither anxious or depressed; affect is appropriate. HEENT: Mucous membranes pink and anicteric; PERRLA; EOM intact; no cervical lymphadenopathy nor thyromegaly or carotid bruit; thick neck Breasts:: Not examined CHEST WALL: No tenderness CHEST: Normal respiration, clear to auscultation bilaterally HEART: Regular rate and rhythm; no murmurs rubs or gallops BACK: No kyphosis no scoliosis; no CVA tenderness ABDOMEN: Obese, soft non-tender; no masses, no organomegaly, normal abdominal bowel sounds;  no intertriginous candida. Rectal Exam: Not  done EXTREMITIES:  age-appropriate arthropathy of the hands and knees; chronic venous stasis changes lower legs bilaterally; erythema of both anterior lateral legs spreading up to just below the knees. 2+ pitting edema bilaterally ; no ulcerations. Genitalia: not examined PULSES: 2+ and symmetric SKIN: Normal hydration no rash or ulceration CNS: Cranial nerves 2-12 grossly intact no focal lateralizing neurologic deficit   Labs on Admission:  Basic Metabolic Panel:  Recent Labs Lab 02/12/13 2015  NA 140  K 3.1*  CL 100  CO2 31  GLUCOSE 222*  BUN 25*  CREATININE 1.60*  CALCIUM 9.5   Liver Function Tests: No results found for this basename: AST, ALT, ALKPHOS, BILITOT, PROT, ALBUMIN,  in the last 168 hours No results found for this basename: LIPASE, AMYLASE,  in the last 168 hours No results found for this basename: AMMONIA,  in the last 168 hours CBC:  Recent Labs Lab 02/12/13 2015  WBC 7.0  NEUTROABS 4.9  HGB 11.6*  HCT 35.6*  MCV 88.3  PLT 171   Cardiac Enzymes: No results found for this basename: CKTOTAL, CKMB, CKMBINDEX, TROPONINI,  in the last 168 hours BNP: No components found with this basename: POCBNP,  D-dimer: No components found with this basename: D-DIMER,  CBG:  Recent Labs Lab 02/12/13 2303  GLUCAP 191*    Radiological Exams on Admission: Dg Chest 2 View  02/12/2013  *RADIOLOGY REPORT*  Clinical Data: Lower extremity swelling.  CHEST - 2 VIEW  Comparison: PA and lateral chest 10/19/2011.  Findings: There is cardiomegaly without edema.  No focal airspace disease is seen.  Study is limited by the patient's body habitus.  IMPRESSION: Cardiomegaly without acute disease.   Original Report Authenticated By: Holley Dexter, M.D.      Assessment/Plan Present on Admission:   Bilateral lower extremity cellulitis Bilateral leg edema with evidence of chronic venous stasis changes Diabetes type 2 uncontrolled Probable Chronic kidney disease unknown  stage Obesity Hypothyroidism   PLAN: We'll admit this gentleman for intravenous antibiotic therapy for cellulitis. We note she is been taking twice daily HCTZ, will discontinue this and given him a dose of Lasix and assess his response Continue outpatient management of his hypothyroidism and diabetes But will give sliding scale insulin instead of his to schedule short-acting insulin Consider 2-D echo as an in or outpatient  Other plans as per orders.  Code Status: FULL CODE  Family Communication: With patient and wife at bedside Disposition Plan: Depending on response in patient management    CAMPBELL,LEOPOLD Nocturnist [facility] Pager [phone]   02/13/2013, 12:17 AM

## 2013-02-13 NOTE — Progress Notes (Signed)
ANTIBIOTIC CONSULT NOTE - INITIAL  Pharmacy Consult for vancomycin & Zosyn Indication: cellulitis of legs  No Known Allergies  Patient Measurements: Height: 6\' 4"  (193 cm) Weight: 345 lb 8 oz (156.718 kg) IBW/kg (Calculated) : 86.8 Adjusted Body Weight: 110kg  Vital Signs: Temp: 98.1 F (36.7 C) (03/09 2256) Temp src: Oral (03/09 2256) BP: 153/83 mmHg (03/09 2256) Pulse Rate: 69 (03/09 2256) Intake/Output from previous day:   Intake/Output from this shift:    Labs:  Recent Labs  02/12/13 2015  WBC 7.0  HGB 11.6*  PLT 171  CREATININE 1.60*   Estimated Creatinine Clearance: 62.8 ml/min (by C-G formula based on Cr of 1.6).    Microbiology: No results found for this or any previous visit (from the past 720 hour(s)).  Medical History: Past Medical History  Diagnosis Date  . Hypertension   . Diabetes mellitus   . Hypothyroidism   . Depression     Medications:  Scheduled:  . ALPRAZolam  1 mg Oral QHS  . diltiazem  300 mg Oral q morning - 10a  . enoxaparin (LOVENOX) injection  30 mg Subcutaneous Q24H  . furosemide  40 mg Intravenous Once  . insulin aspart  0-15 Units Subcutaneous TID WC  . insulin aspart  0-5 Units Subcutaneous QHS  . insulin detemir  80 Units Subcutaneous QHS  . levothyroxine  100 mcg Oral QAC breakfast  . linagliptin  5 mg Oral q morning - 10a  . niacin  500 mg Oral QHS  . omega-3 acid ethyl esters  1 capsule Oral BID  . piperacillin-tazobactam (ZOSYN)  IV  3.375 g Intravenous Q8H  . potassium chloride  10 mEq Oral q morning - 10a  . [COMPLETED] potassium chloride  40 mEq Oral Once  . ramipril  10 mg Oral BID  . terazosin  10 mg Oral QHS  . Travoprost (BAK Free)  1 drop Both Eyes QHS  . [COMPLETED] vancomycin  1,000 mg Intravenous Once   Infusions:   PRN: acetaminophen, ondansetron (ZOFRAN) IV, sorbitol  Assessment: 30yr obese male with normal renal function for age.  Treatment needed for cellulitis of both legs.  Vancomycin 1gm  already given x 1 dose @ 2130.  Goal of Therapy:  Will initiate tx tonight with further follow-up in am for orders on maintenance regimens  Plan:  1.  Start Zosyn 3.375gm IV q8h because CrCl>20-29ml/min 2. Will given an additional Vancomycin 1gm at 6am because patient should have received a loading dose of 1500mg  at 2130 last evening  Scarlett Presto 02/13/2013,12:22 AM

## 2013-02-13 NOTE — Progress Notes (Addendum)
ANTIBIOTIC CONSULT NOTE - INITIAL  Pharmacy Consult for Vancomycin & Zosyn Indication: cellulitis  No Known Allergies  Patient Measurements: Height: 6\' 4"  (193 cm) Weight: 344 lb 11.2 oz (156.355 kg) IBW/kg (Calculated) : 86.8  Vital Signs: Temp: 97.8 F (36.6 C) (03/10 0629) Temp src: Oral (03/10 0629) BP: 160/82 mmHg (03/10 0629) Pulse Rate: 73 (03/10 0629) Intake/Output from previous day: 03/09 0701 - 03/10 0700 In: 990 [P.O.:540; IV Piggyback:450] Out: 2275 [Urine:2275] Intake/Output from this shift:    Labs:  Recent Labs  02/12/13 2015 02/13/13 0520  WBC 7.0 7.0  HGB 11.6* 10.9*  PLT 171 172  CREATININE 1.60* 1.54*   Estimated Creatinine Clearance: 65.1 ml/min (by C-G formula based on Cr of 1.54). No results found for this basename: VANCOTROUGH, VANCOPEAK, VANCORANDOM, GENTTROUGH, GENTPEAK, GENTRANDOM, TOBRATROUGH, TOBRAPEAK, TOBRARND, AMIKACINPEAK, AMIKACINTROU, AMIKACIN,  in the last 72 hours   Microbiology: No results found for this or any previous visit (from the past 720 hour(s)).  Medical History: Past Medical History  Diagnosis Date  . Hypertension   . Diabetes mellitus   . Hypothyroidism   . Depression     Medications:  Scheduled:  . ALPRAZolam  1 mg Oral QHS  . diltiazem  300 mg Oral q morning - 10a  . enoxaparin (LOVENOX) injection  30 mg Subcutaneous Q24H  . [COMPLETED] furosemide  40 mg Intravenous Once  . insulin aspart  0-15 Units Subcutaneous TID WC  . insulin aspart  0-5 Units Subcutaneous QHS  . insulin detemir  80 Units Subcutaneous QHS  . levothyroxine  100 mcg Oral QAC breakfast  . linagliptin  5 mg Oral q morning - 10a  . niacin  500 mg Oral QHS  . omega-3 acid ethyl esters  1 capsule Oral BID  . piperacillin-tazobactam (ZOSYN)  IV  3.375 g Intravenous Q8H  . potassium chloride  10 mEq Oral q morning - 10a  . [COMPLETED] potassium chloride  40 mEq Oral Once  . ramipril  10 mg Oral BID  . terazosin  10 mg Oral QHS  .  Travoprost (BAK Free)  1 drop Both Eyes QHS  . [COMPLETED] vancomycin  1,000 mg Intravenous Once  . [COMPLETED] vancomycin  1,000 mg Intravenous Once   Assessment: 79 yo M with cellulitis of BLE admitted for IV antibiotics.  He was started on Vancomycin & Zosyn in ED.   He is currently afebrile with normal WBC.   Renal function appears to be slightly elevated above patient's baseline.  Patient required Vancomycin 1250mg  IV Q12h while here in Oct 2012.  Goal of Therapy:  Vancomycin trough level 10-15 mcg/ml  Plan:  1) Zosyn 3.375gm IV Q8h to be infused over 4hrs 2) Vancomycin 1250mg  IV Q12h 3) Check Vancomycin trough at steady state 4) Monitor renal function and cx data  5) Duration of therapy per MD  Elson Clan 02/13/2013,8:28 AM

## 2013-02-14 DIAGNOSIS — I517 Cardiomegaly: Secondary | ICD-10-CM

## 2013-02-14 LAB — BASIC METABOLIC PANEL
Calcium: 8.9 mg/dL (ref 8.4–10.5)
Potassium: 3.1 mEq/L — ABNORMAL LOW (ref 3.5–5.1)

## 2013-02-14 LAB — GLUCOSE, CAPILLARY: Glucose-Capillary: 267 mg/dL — ABNORMAL HIGH (ref 70–99)

## 2013-02-14 MED ORDER — FUROSEMIDE 40 MG PO TABS: 40.0000 mg | ORAL_TABLET | Freq: Every day | ORAL | Status: DC

## 2013-02-14 MED ORDER — FUROSEMIDE 40 MG PO TABS
40.0000 mg | ORAL_TABLET | Freq: Every day | ORAL | Status: DC
  Administered 2013-02-14: 40 mg via ORAL
  Filled 2013-02-14: qty 1

## 2013-02-14 MED ORDER — LEVOFLOXACIN 500 MG PO TABS: 500.0000 mg | ORAL_TABLET | Freq: Every day | ORAL | Status: DC

## 2013-02-14 MED ORDER — POTASSIUM CHLORIDE CRYS ER 20 MEQ PO TBCR: 40.0000 meq | EXTENDED_RELEASE_TABLET | Freq: Every morning | ORAL | Status: DC

## 2013-02-14 MED ORDER — POTASSIUM CHLORIDE IN NACL 20-0.9 MEQ/L-% IV SOLN
INTRAVENOUS | Status: DC
  Administered 2013-02-14: 10:00:00 via INTRAVENOUS

## 2013-02-14 NOTE — Progress Notes (Signed)
*  PRELIMINARY RESULTS* Echocardiogram 2D Echocardiogram has been performed.  Garrett Bradley 02/14/2013, 12:04 PM

## 2013-02-14 NOTE — Progress Notes (Signed)
Inpatient Diabetes Program Recommendations  AACE/ADA: New Consensus Statement on Inpatient Glycemic Control (2013)  Target Ranges:  Prepandial:   less than 140 mg/dL      Peak postprandial:   less than 180 mg/dL (1-2 hours)      Critically ill patients:  140 - 180 mg/dL    Results for [name], [name] (MRN [id number]) as of 02/14/2013 11:05  Ref. Range 02/13/2013 07:35 02/13/2013 11:51 02/13/2013 16:09 02/13/2013 21:17  Glucose-Capillary Latest Range: 70-99 mg/dL 469 (H) 629 (H) 528 (H) 200 (H)   Results for [name], [name] (MRN [id number]) as of 02/14/2013 11:05  Ref. Range 02/14/2013 08:54  Glucose-Capillary Latest Range: 70-99 mg/dL 272 (H)   Fasting glucose and afternoon glucose levels continue to stay elevated.  Recommend the following insulin adjustments: 1. Increase Lantus to 85 units QHS 2. Add meal coverage- Novolog 6 units tid with meals  Will follow. Ambrose Finland RN, MSN, CDE Diabetes Coordinator Inpatient Diabetes Program [phone]

## 2013-02-14 NOTE — Progress Notes (Signed)
Patient with orders to be discharge home. Discharge instructions given, patient verbalized understanding. Patient in stable condition upon discharge. Patient left with spouse in private vehicle.  

## 2013-02-14 NOTE — Discharge Summary (Signed)
Physician Discharge Summary  Garrett Bradley JYN:[id number] DOB: Jan 04, 1934 DOA: 02/12/2013  PCP: Colette Ribas, MD  Admit date: 02/12/2013 Discharge date: 02/14/2013  Time spent: 40 minutes  Recommendations for Outpatient Follow-up:  1. Follow up with PCP 02/17/13.  2. Will need bmet on 02/17/13 for evaluation renal function and potassium level 3. Recommend documenting all CBG reading from discharge to 02/17/13 and take data to PCP on 02/17/13 for evaluation of glucose control 4. May need dermatology referral if rash to LE persists  Discharge Diagnoses:  Active Problems:   Hypothyroidism   Anemia   Hypertension   Cellulitis of right lower leg   Cellulitis of left lower leg   Chronic kidney disease   Diabetes type 2, uncontrolled   Hypokalemia   Discharge Condition: stable  Diet recommendation: carb modified  Filed Weights   02/12/13 2256 02/13/13 0551 02/14/13 0503  Weight: 156.718 kg (345 lb 8 oz) 156.355 kg (344 lb 11.2 oz) 156.5 kg (345 lb 0.3 oz)    History of present illness:  Garrett Bradley is an 79 y.o. male. Morbidly obese African American gentleman presented on 02/13/13 with cc of swelling of legs for 4 weeks. Denied any history of heart failure or kidney disease. Denied any fever or chills; he denied any pain in the legs. Denied trauma to his lower extremity. He came to the hospital not because of redness of the legs but because the legs were getting so tight they felt very uncomfortable. He noted he had gained 25 pounds in the past 4-5 months since starting insulin. His fasting blood sugars are typically around 200      Hospital Course:  Bilateral lower extremity cellulitis :  Admitted to medical floor. Started on  Vanc and Zosyn. Pt remained afebrile, non-toxic appearing. On day of discharge erythema/ rash improved. May benefit from OP dermatology consult  Bilateral leg edema with evidence of chronic venous stasis changes: Given IV lasix in ED and much improved.  Weight unchanged from admission.  Last echo 11/12 with EF 50-55%. BNP 548.  Will discharge with lasix 40mg  daily.   Diabetes type 2 uncontrolled: wife reports on insulin for 2 years. HgA1c 10.6  Continued levimir and SSI. Pt to see PCP 3 days and provide CBG readings for evaluation of glycemic control. Will need close follow up OP   Acute on Chronic kidney disease stage IV : chart review indicates baseline creatinine 1.2.  Creatinine trended from 1.5 to 1.7 on day of discharge.  Likely related to HCTZ at home, ACE inhibitor and IV lasix received in ED as well as possible worsening of disease.  At discharge ACE inhibitor and HCTZ discontinued. Recommend BMET on 02/17/13 at PCP follow up appointment.    Hypertension: fair control. Discontinuing HCTZ and ACE inhibitor. Continue cardizem.   Obesity : BMI 42.1kg. Counseled regarding diet and wt los   Hypothyroidism: TSH 5.1 Continue synthroid.   Hypokalemia: likely related to HCTZ and lasix. Increased daily dose to . Recommend BMET 02/17/13.   Anemia: likely of chronic disease. Chart review indicates current level baseline. No s/sx active bleeding.      Procedures:  none  Consultations:  none  Discharge Exam: Filed Vitals:   02/13/13 1902 02/13/13 2115 02/14/13 0200 02/14/13 0503  BP: 143/53 127/79 133/52 144/58  Pulse: 66 65 64 69  Temp: 98 F (36.7 C) 98.7 F (37.1 C) 98.3 F (36.8 C) 98.7 F (37.1 C)  TempSrc: Oral Oral Oral Oral  Resp: 20  20 20 20   Height:      Weight:    156.5 kg (345 lb 0.3 oz)  SpO2: 95% 95% 93% 98%    General: awake, ambulating in room gait steady Cardiovascular: RRR No MGR 1-2+lower extremity edema, chronic venous stasis changes Respiratory: normal effort BSCTAB No wheeze  Discharge Instructions  Discharge Orders   Future Orders Complete By Expires     Call MD for:  difficulty breathing, headache or visual disturbances  As directed     Call MD for:  persistant dizziness or light-headedness   As directed     Diet - low sodium heart healthy  As directed     Discharge instructions  As directed     Comments:      Take medications as prescribed Document all CBG reading from discharge to appointment with PCP. Take data to PCP for evaluation of blood sugar control    Increase activity slowly  As directed         Medication List    STOP taking these medications       hydrochlorothiazide 25 MG tablet  Commonly known as:  HYDRODIURIL     potassium chloride 10 MEQ tablet  Commonly known as:  K-DUR  Replaced by:  potassium chloride SA 20 MEQ tablet     ramipril 10 MG capsule  Commonly known as:  ALTACE      TAKE these medications       ALPRAZolam 1 MG tablet  Commonly known as:  XANAX  Take 1 mg by mouth at bedtime. For anxiety     diltiazem 300 MG 24 hr capsule  Commonly known as:  CARDIZEM CD  Take 300 mg by mouth every morning.     ferrous sulfate 325 (65 FE) MG tablet  Take 325 mg by mouth every morning.     fish oil-omega-3 fatty acids 1000 MG capsule  Take 1 capsule (1 g total) by mouth 2 (two) times daily.     furosemide 40 MG tablet  Commonly known as:  LASIX  Take 1 tablet (40 mg total) by mouth daily.     HUMALOG KWIKPEN 100 UNIT/ML injection  Generic drug:  insulin lispro  Inject 25 Units into the skin 3 (three) times daily before meals.     LEVEMIR FLEXPEN 100 UNIT/ML injection  Generic drug:  insulin detemir  Inject 80 Units into the skin at bedtime.     levofloxacin 500 MG tablet  Commonly known as:  LEVAQUIN  Take 1 tablet (500 mg total) by mouth daily.     levothyroxine 100 MCG tablet  Commonly known as:  SYNTHROID, LEVOTHROID  Take 100 mcg by mouth every morning.     niacin 500 MG tablet  Take 500 mg by mouth at bedtime.     potassium chloride SA 20 MEQ tablet  Commonly known as:  K-DUR,KLOR-CON  Take 2 tablets (40 mEq total) by mouth every morning.  Start taking on:  02/15/2013     terazosin 10 MG capsule  Commonly known as:   HYTRIN  Take 1 capsule (10 mg total) by mouth at bedtime.     TRADJENTA 5 MG Tabs tablet  Generic drug:  linagliptin  Take 5 mg by mouth every morning.     Travoprost (BAK Free) 0.004 % Soln ophthalmic solution  Commonly known as:  TRAVATAN  Place 1 drop into both eyes at bedtime.           Follow-up Information   Follow  up with Colette Ribas, MD On 02/17/2013. (has appointment with Jean Rosenthal at 2:30pm Will need bmet to evaluate renal function and potassium level. )    Contact information:   [address] [place] [address] [phone]        The results of significant diagnostics from this hospitalization (including imaging, microbiology, ancillary and laboratory) are listed below for reference.    Significant Diagnostic Studies: Dg Chest 2 View  02/12/2013  *RADIOLOGY REPORT*  Clinical Data: Lower extremity swelling.  CHEST - 2 VIEW  Comparison: PA and lateral chest 10/19/2011.  Findings: There is cardiomegaly without edema.  No focal airspace disease is seen.  Study is limited by the patient's body habitus.  IMPRESSION: Cardiomegaly without acute disease.   Original Report Authenticated By: Holley Dexter, M.D.         Labs: Basic Metabolic Panel:  Recent Labs Lab 02/12/13 2015 02/13/13 0520 02/14/13 0528  NA 140 141 142  K 3.1* 3.0* 3.1*  CL 100 100 101  CO2 31 33* 30  GLUCOSE 222* 195* 226*  BUN 25* 25* 24*  CREATININE 1.60* 1.54* 1.71*  CALCIUM 9.5 9.3 8.9  MG 1.8  --   --    Liver Function Tests:  Recent Labs Lab 02/12/13 2015  AST 20  ALT 18  ALKPHOS 62  BILITOT 0.2*  PROT 7.5  ALBUMIN 3.1*     CBC:  Recent Labs Lab 02/12/13 2015 02/13/13 0520  WBC 7.0 7.0  NEUTROABS 4.9  --   HGB 11.6* 10.9*  HCT 35.6* 33.2*  MCV 88.3 88.8  PLT 171 172     BNP: BNP (last 3 results)  Recent Labs  02/12/13 2015  PROBNP 141.0   CBG:  Recent Labs Lab 02/13/13 0735 02/13/13 1151 02/13/13 1609  02/13/13 2117 02/14/13 0854  GLUCAP 180* 219* 232* 200* 194*       Signed:  BLACK,KAREN M  [facility] 02/14/2013, 11:20 AM Attending: Patient seen and examined. Agree with discharge plans. Patient is medically stable for discharge. Will need dermatological evaluation of bilateral leg rash/erythematous area.

## 2013-02-14 NOTE — Progress Notes (Signed)
UR Chart Review Completed  

## 2013-03-29 ENCOUNTER — Emergency Department (HOSPITAL_COMMUNITY): Payer: Medicare Other

## 2013-03-29 ENCOUNTER — Encounter (HOSPITAL_COMMUNITY): Payer: Self-pay | Admitting: *Deleted

## 2013-03-29 ENCOUNTER — Emergency Department (HOSPITAL_COMMUNITY)
Admission: EM | Admit: 2013-03-29 | Discharge: 2013-03-29 | Disposition: A | Discharge status: Home / Self Care | Payer: Medicare Other | Attending: Emergency Medicine | Admitting: Emergency Medicine

## 2013-03-29 DIAGNOSIS — Z79899 Other long term (current) drug therapy: Secondary | ICD-10-CM | POA: Insufficient documentation

## 2013-03-29 DIAGNOSIS — R42 Dizziness and giddiness: Secondary | ICD-10-CM | POA: Insufficient documentation

## 2013-03-29 DIAGNOSIS — E876 Hypokalemia: Secondary | ICD-10-CM

## 2013-03-29 DIAGNOSIS — R5381 Other malaise: Secondary | ICD-10-CM | POA: Insufficient documentation

## 2013-03-29 DIAGNOSIS — E875 Hyperkalemia: Secondary | ICD-10-CM | POA: Insufficient documentation

## 2013-03-29 DIAGNOSIS — R350 Frequency of micturition: Secondary | ICD-10-CM | POA: Insufficient documentation

## 2013-03-29 DIAGNOSIS — R3915 Urgency of urination: Secondary | ICD-10-CM | POA: Insufficient documentation

## 2013-03-29 DIAGNOSIS — E039 Hypothyroidism, unspecified: Secondary | ICD-10-CM | POA: Insufficient documentation

## 2013-03-29 DIAGNOSIS — Z794 Long term (current) use of insulin: Secondary | ICD-10-CM | POA: Insufficient documentation

## 2013-03-29 DIAGNOSIS — E669 Obesity, unspecified: Secondary | ICD-10-CM | POA: Insufficient documentation

## 2013-03-29 DIAGNOSIS — I10 Essential (primary) hypertension: Secondary | ICD-10-CM | POA: Insufficient documentation

## 2013-03-29 DIAGNOSIS — E162 Hypoglycemia, unspecified: Secondary | ICD-10-CM

## 2013-03-29 DIAGNOSIS — R531 Weakness: Secondary | ICD-10-CM

## 2013-03-29 DIAGNOSIS — N289 Disorder of kidney and ureter, unspecified: Secondary | ICD-10-CM | POA: Insufficient documentation

## 2013-03-29 DIAGNOSIS — F329 Major depressive disorder, single episode, unspecified: Secondary | ICD-10-CM | POA: Insufficient documentation

## 2013-03-29 DIAGNOSIS — F3289 Other specified depressive episodes: Secondary | ICD-10-CM | POA: Insufficient documentation

## 2013-03-29 DIAGNOSIS — E1169 Type 2 diabetes mellitus with other specified complication: Secondary | ICD-10-CM | POA: Insufficient documentation

## 2013-03-29 LAB — CBC WITH DIFFERENTIAL/PLATELET
Basophils Relative: 0 % (ref 0–1)
Eosinophils Absolute: 0.1 10*3/uL (ref 0.0–0.7)
Eosinophils Relative: 1 % (ref 0–5)
Hemoglobin: 12.1 g/dL — ABNORMAL LOW (ref 13.0–17.0)
MCH: 29 pg (ref 26.0–34.0)
MCHC: 33.2 g/dL (ref 30.0–36.0)
MCV: 87.5 fL (ref 78.0–100.0)
Monocytes Absolute: 0.5 10*3/uL (ref 0.1–1.0)
Monocytes Relative: 8 % (ref 3–12)
Neutrophils Relative %: 69 % (ref 43–77)

## 2013-03-29 LAB — URINALYSIS, ROUTINE W REFLEX MICROSCOPIC
Glucose, UA: NEGATIVE mg/dL
Leukocytes, UA: NEGATIVE
Nitrite: NEGATIVE
pH: 6 (ref 5.0–8.0)

## 2013-03-29 LAB — POCT I-STAT TROPONIN I: Troponin i, poc: 0 ng/mL (ref 0.00–0.08)

## 2013-03-29 LAB — COMPREHENSIVE METABOLIC PANEL
Albumin: 3.5 g/dL (ref 3.5–5.2)
BUN: 17 mg/dL (ref 6–23)
Calcium: 9.1 mg/dL (ref 8.4–10.5)
Creatinine, Ser: 1.45 mg/dL — ABNORMAL HIGH (ref 0.50–1.35)
GFR calc Af Amer: 52 mL/min — ABNORMAL LOW (ref >90)
Total Protein: 7.8 g/dL (ref 6.0–8.3)

## 2013-03-29 MED ORDER — POTASSIUM CHLORIDE CRYS ER 20 MEQ PO TBCR
40.0000 meq | EXTENDED_RELEASE_TABLET | Freq: Once | ORAL | Status: AC
  Administered 2013-03-29: 40 meq via ORAL
  Filled 2013-03-29: qty 2

## 2013-03-29 NOTE — ED Notes (Signed)
Reports generalized weakness x 1 week; reports CBG at home at 1330 was 58 - states drank orange juice to raise blood glucose, but did not eat lunch.  A&ox4; answers questions appropriately; skin warm/dry.

## 2013-03-29 NOTE — ED Provider Notes (Signed)
History  This chart was scribed for Dione Booze, MD by Shari Heritage and Lacey Jensen, ED Scribes. The patient was seen in room APAH4/APAH4. Patient's care was started at 1509.   CSN: [id number]  Arrival date & time 03/29/13  1359   First MD Initiated Contact with Patient 03/29/13 [phone]      Chief Complaint  Patient presents with  . Weakness     The history is provided by the patient. No language interpreter was used.    HPI Comments: Garrett Bradley is a 79 y.o. male with history hypertension, diabetes, hypothyroidism and depression who presents to the Emergency Department complaining of generalized weakness for the past 3 weeks. Patient also reports that his blood sugar dropped suddenly at about 1:30 pm today. He states that he took his glucose levels at home and it was 58. He drank some orange juice, but blood glucose did not improve. He states that he did not eat lunch. Patient's associated symptoms include urinary frequency and urgency and intermittent dizziness and lightheadedness. Patient denies nausea, vomiting, chest pain, myalgias, chest pain, fever, chills or diaphoresis. Patient does not smoke or use alcohol.  PCP- Phillips Odor  Past Medical History  Diagnosis Date  . Hypertension   . Diabetes mellitus   . Hypothyroidism   . Depression     Past Surgical History  Procedure Laterality Date  . Hernia repair    . Transurethral resection of prostate    . Transurethral resection of prostate  09/22/2011    Procedure: TRANSURETHRAL RESECTION OF THE PROSTATE (TURP);  Surgeon: Ky Barban;  Location: [facility];  Service: Urology;  Laterality: N/A;    Family History  Problem Relation Age of Onset  . Hypertension Mother   . Hypertension Brother   . Cerebral aneurysm Mother     History  Substance Use Topics  . Smoking status: Never Smoker   . Smokeless tobacco: Never Used  . Alcohol Use: No      Review of Systems  Constitutional: Negative for fever, chills and  diaphoresis.  Cardiovascular: Negative for chest pain.  Gastrointestinal: Negative for nausea and vomiting.  Genitourinary: Positive for urgency and frequency.  Musculoskeletal: Negative for myalgias.  Neurological: Positive for dizziness, weakness and light-headedness.  All other systems reviewed and are negative.    Allergies  Review of patient's allergies indicates no known allergies.  Home Medications   Current Outpatient Rx  Name  Route  Sig  Dispense  Refill  . ALPRAZolam (XANAX) 1 MG tablet   Oral   Take 1 mg by mouth at bedtime. For anxiety         . diltiazem (CARDIZEM CD) 300 MG 24 hr capsule   Oral   Take 300 mg by mouth every morning.         . ferrous sulfate 325 (65 FE) MG tablet   Oral   Take 325 mg by mouth every morning.         . fish oil-omega-3 fatty acids 1000 MG capsule   Oral   Take 1 capsule (1 g total) by mouth 2 (two) times daily.         . furosemide (LASIX) 40 MG tablet   Oral   Take 1 tablet (40 mg total) by mouth daily.   30 tablet   0   . insulin detemir (LEVEMIR FLEXPEN) 100 UNIT/ML injection   Subcutaneous   Inject 80 Units into the skin at bedtime.         Marland Kitchen  insulin lispro (HUMALOG KWIKPEN) 100 UNIT/ML injection   Subcutaneous   Inject 25 Units into the skin 3 (three) times daily before meals.         Marland Kitchen levothyroxine (SYNTHROID, LEVOTHROID) 100 MCG tablet   Oral   Take 100 mcg by mouth every morning.         . linagliptin (TRADJENTA) 5 MG TABS tablet   Oral   Take 5 mg by mouth every morning.         . niacin 500 MG tablet   Oral   Take 500 mg by mouth at bedtime.         . potassium chloride SA (K-DUR,KLOR-CON) 20 MEQ tablet   Oral   Take 2 tablets (40 mEq total) by mouth every morning.   30 tablet   0   . terazosin (HYTRIN) 10 MG capsule   Oral   Take 1 capsule (10 mg total) by mouth at bedtime.   30 capsule   0   . Travoprost, BAK Free, (TRAVATAMN) 0.004 % SOLN ophthalmic solution   Both  Eyes   Place 1 drop into both eyes at bedtime.             Triage Vitals: BP 154/64  Pulse 72  Temp(Src) 98.3 F (36.8 C) (Oral)  Resp 18  Ht 6\' 4"  (1.93 m)  Wt 330 lb (149.687 kg)  BMI 40.19 kg/m2  SpO2 96%  Physical Exam  Constitutional: He is oriented to person, place, and time. He appears well-developed and well-nourished. No distress.  Obese. Smells of urine.  HENT:  Head: Normocephalic and atraumatic.  Eyes: Conjunctivae and EOM are normal. Pupils are equal, round, and reactive to light.  Neck: Normal range of motion. Neck supple.  Cardiovascular: Normal rate and regular rhythm.   Pulmonary/Chest: Effort normal and breath sounds normal.  Abdominal: He exhibits no distension.  Musculoskeletal: He exhibits edema.  1+ edema with venous stasis changes.  Neurological: He is alert and oriented to person, place, and time.  Skin: Skin is warm and dry.    ED Course  Procedures (including critical care time)  DIAGNOSTIC STUDIES: Oxygen Saturation is 96% on room air, adequate by my interpretation.    COORDINATION OF CARE: 3:35 PM- Patient informed of current plan for treatment and evaluation and agrees with plan at this time.     Labs Reviewed  GLUCOSE, CAPILLARY - Abnormal; Notable for the following:    Glucose-Capillary 133 (*)    All other components within normal limits  CBC WITH DIFFERENTIAL  COMPREHENSIVE METABOLIC PANEL  URINALYSIS, ROUTINE W REFLEX MICROSCOPIC    Dg Chest 2 View  03/29/2013  *RADIOLOGY REPORT*  Clinical Data: Weakness.  Hypertension.  Diabetes.  CHEST - 2 VIEW  Comparison: 02/12/2013  Findings: Mild cardiomegaly noted with mild indistinctness of pulmonary vasculature suspicious for pulmonary venous hypertension. No overt edema.  Thoracic spondylosis is present.  Mild atherosclerotic calcification of the aortic arch is observed.  IMPRESSION:  1.  Cardiomegaly with mildly indistinct pulmonary vasculature suggesting pulmonary venous hypertension.   No overt edema.   Original Report Authenticated By: Gaylyn Rong, M.D.      Date: 03/29/2013  Rate: 60  Rhythm: normal sinus rhythm and premature atrial contractions (PAC)  QRS Axis: left  Intervals: normal  ST/T Wave abnormalities: normal  Conduction Disutrbances:right bundle branch block and left anterior fascicular block  Narrative Interpretation: Right bundle branch block and left anterior fascicular block. When compared with ECG of 10/20/2011, right  bundle branch block is now present.  Old EKG Reviewed: changes noted    1. Hypoglycemia   2. Weakness   3. Renal insufficiency   4. Hypokalemia       MDM  Weakness of uncertain cause. He needs to be evaluated for possible occult infection. Episode of hypoglycemia today which has resolved with appropriate management at home by drinking orange juice.  Potassium has come back slightly low at 3.3 which may account for some of the symptoms of weakness. He is given a dose of potassium in the ED. Renal insufficiency is present the neck she improved over baseline. No evidence of UTI. He is discharged with instructions to increase his potassium to 40 mEq a take 80 mEq a day and he is to followup with his PCP.    I personally performed the services described in this documentation, which was scribed in my presence. The recorded information has been reviewed and is accurate.     Dione Booze, MD 03/29/13 1759

## 2013-04-09 ENCOUNTER — Encounter (HOSPITAL_COMMUNITY): Payer: Self-pay

## 2013-04-09 ENCOUNTER — Emergency Department (HOSPITAL_COMMUNITY)
Admission: EM | Admit: 2013-04-09 | Discharge: 2013-04-09 | Disposition: A | Discharge status: Home / Self Care | Payer: Medicare Other | Attending: Emergency Medicine | Admitting: Emergency Medicine

## 2013-04-09 DIAGNOSIS — Z87448 Personal history of other diseases of urinary system: Secondary | ICD-10-CM | POA: Insufficient documentation

## 2013-04-09 DIAGNOSIS — Z79899 Other long term (current) drug therapy: Secondary | ICD-10-CM | POA: Insufficient documentation

## 2013-04-09 DIAGNOSIS — R142 Eructation: Secondary | ICD-10-CM | POA: Insufficient documentation

## 2013-04-09 DIAGNOSIS — R34 Anuria and oliguria: Secondary | ICD-10-CM | POA: Insufficient documentation

## 2013-04-09 DIAGNOSIS — E039 Hypothyroidism, unspecified: Secondary | ICD-10-CM | POA: Insufficient documentation

## 2013-04-09 DIAGNOSIS — R339 Retention of urine, unspecified: Secondary | ICD-10-CM | POA: Insufficient documentation

## 2013-04-09 DIAGNOSIS — R143 Flatulence: Secondary | ICD-10-CM | POA: Insufficient documentation

## 2013-04-09 DIAGNOSIS — Z794 Long term (current) use of insulin: Secondary | ICD-10-CM | POA: Insufficient documentation

## 2013-04-09 DIAGNOSIS — R338 Other retention of urine: Secondary | ICD-10-CM

## 2013-04-09 DIAGNOSIS — Z9889 Other specified postprocedural states: Secondary | ICD-10-CM | POA: Insufficient documentation

## 2013-04-09 DIAGNOSIS — I10 Essential (primary) hypertension: Secondary | ICD-10-CM | POA: Insufficient documentation

## 2013-04-09 DIAGNOSIS — F3289 Other specified depressive episodes: Secondary | ICD-10-CM | POA: Insufficient documentation

## 2013-04-09 DIAGNOSIS — R141 Gas pain: Secondary | ICD-10-CM | POA: Insufficient documentation

## 2013-04-09 DIAGNOSIS — E119 Type 2 diabetes mellitus without complications: Secondary | ICD-10-CM | POA: Insufficient documentation

## 2013-04-09 DIAGNOSIS — F329 Major depressive disorder, single episode, unspecified: Secondary | ICD-10-CM | POA: Insufficient documentation

## 2013-04-09 LAB — BASIC METABOLIC PANEL
BUN: 15 mg/dL (ref 6–23)
CO2: 30 mEq/L (ref 19–32)
GFR calc non Af Amer: 41 mL/min — ABNORMAL LOW (ref >90)
Glucose, Bld: 65 mg/dL — ABNORMAL LOW (ref 70–99)
Potassium: 3.4 mEq/L — ABNORMAL LOW (ref 3.5–5.1)

## 2013-04-09 LAB — GLUCOSE, CAPILLARY: Glucose-Capillary: 93 mg/dL (ref 70–99)

## 2013-04-09 LAB — URINALYSIS, ROUTINE W REFLEX MICROSCOPIC
Bilirubin Urine: NEGATIVE
Specific Gravity, Urine: 1.015 (ref 1.005–1.030)
Urobilinogen, UA: 0.2 mg/dL (ref 0.0–1.0)

## 2013-04-09 LAB — URINE MICROSCOPIC-ADD ON

## 2013-04-09 MED ORDER — POTASSIUM CHLORIDE ER 10 MEQ PO TBCR: 20.0000 meq | EXTENDED_RELEASE_TABLET | Freq: Two times a day (BID) | ORAL | Status: DC

## 2013-04-09 NOTE — ED Notes (Signed)
Foley draining without difficulty. Will assess output after allowing foley to drain for 10 minutes.

## 2013-04-09 NOTE — ED Provider Notes (Signed)
Medical screening examination/treatment/procedure(s) were conducted as a shared visit with non-physician practitioner(s) and myself.  I personally evaluated the patient during the encounter  Please see my separate respective documentation pertaining to this patient encounter   Vida Roller, MD 04/09/13 2110

## 2013-04-09 NOTE — ED Provider Notes (Signed)
This chart was scribed for Vida Roller, MD by Shari Heritage, ED Scribe. The patient was seen in room APA06/APA06. Patient was evaluated by me at 11:17 AM.  Patient with a history of renal insufficiency presents with urinary retention for the past few days. Patient reports he is able to pass small amounts of urine every 2-3 hours. He denies fever, nausea, vomiting, testicular or scrotal swelling. Patient states that his abdominal discomfort improved after catheter insertion.  Physical Exam: No lower extremity edema.  Catheter well seated in the urethra, abd soft, well appaering  I personally performed the services described in this documentation, which was scribed in my presence. The recorded information has been reviewed and is accurate.      Vida Roller, MD 04/09/13 2110

## 2013-04-09 NOTE — ED Provider Notes (Signed)
History     CSN: [id number]  Arrival date & time 04/09/13  7829   First MD Initiated Contact with Patient 04/09/13 0845      Chief Complaint  Patient presents with  . Urinary Retention    (Consider location/radiation/quality/duration/timing/severity/associated sxs/prior treatment) HPI Comments: Garrett Bradley is a 79 y.o. Male with a prior history of diabetes,  Renal insufficiency and history of bph with turp procedure in 2010 presenting with slowly worsening urinary retention over the past several days.  He describes being able to pass small amounts of urine every 2-3 hours,  But has been unable to completely empty his bladder.  He denies dysuria,  Fevers, chills, nausea or vomiting.  He has been limiting his liquid intake the past several days to try to avoid bladder retention.  He has had no intake this am.  His cbg here is 68.     The history is provided by the patient and the spouse.    Past Medical History  Diagnosis Date  . Hypertension   . Diabetes mellitus   . Hypothyroidism   . Depression   . Urinary problem   . Hypokalemia     Past Surgical History  Procedure Laterality Date  . Hernia repair    . Transurethral resection of prostate    . Transurethral resection of prostate  09/22/2011    Procedure: TRANSURETHRAL RESECTION OF THE PROSTATE (TURP);  Surgeon: Ky Barban;  Location: [facility];  Service: Urology;  Laterality: N/A;    Family History  Problem Relation Age of Onset  . Hypertension Mother   . Hypertension Brother   . Cerebral aneurysm Mother     History  Substance Use Topics  . Smoking status: Never Smoker   . Smokeless tobacco: Never Used  . Alcohol Use: No      Review of Systems  Constitutional: Negative for fever and chills.  HENT: Negative for congestion, sore throat and neck pain.   Eyes: Negative.   Respiratory: Negative.  Negative for shortness of breath.   Cardiovascular: Negative.  Negative for chest pain.  Gastrointestinal:  Negative for nausea and abdominal pain.  Genitourinary: Positive for decreased urine volume and difficulty urinating.  Musculoskeletal: Negative for joint swelling and arthralgias.  Skin: Negative.  Negative for rash and wound.       He reports history fluid retention in legs which is better today.  Neurological: Negative.  Negative for dizziness, weakness and headaches.  Psychiatric/Behavioral: Negative.     Allergies  Review of patient's allergies indicates no known allergies.  Home Medications   Current Outpatient Rx  Name  Route  Sig  Dispense  Refill  . ALPRAZolam (XANAX) 1 MG tablet   Oral   Take 1 mg by mouth at bedtime. For anxiety         . diltiazem (CARDIZEM CD) 300 MG 24 hr capsule   Oral   Take 300 mg by mouth daily.          . ferrous sulfate 325 (65 FE) MG tablet   Oral   Take 325 mg by mouth daily.          . fish oil-omega-3 fatty acids 1000 MG capsule   Oral   Take 1 capsule (1 g total) by mouth 2 (two) times daily.         . furosemide (LASIX) 40 MG tablet   Oral   Take 1 tablet (40 mg total) by mouth daily.   30 tablet  0   . insulin detemir (LEVEMIR FLEXPEN) 100 UNIT/ML injection   Subcutaneous   Inject 70 Units into the skin at bedtime.          . insulin lispro (HUMALOG KWIKPEN) 100 UNIT/ML injection   Subcutaneous   Inject 20 Units into the skin 3 (three) times daily before meals.          Marland Kitchen levothyroxine (SYNTHROID, LEVOTHROID) 100 MCG tablet   Oral   Take 100 mcg by mouth daily.          Marland Kitchen linagliptin (TRADJENTA) 5 MG TABS tablet   Oral   Take 5 mg by mouth daily.          . niacin 500 MG tablet   Oral   Take 500 mg by mouth at bedtime.         . potassium chloride SA (K-DUR,KLOR-CON) 20 MEQ tablet   Oral   Take 2 tablets (40 mEq total) by mouth every morning.   30 tablet   0   . terazosin (HYTRIN) 10 MG capsule   Oral   Take 1 capsule (10 mg total) by mouth at bedtime.   30 capsule   0   . Travoprost,  BAK Free, (TRAVATAMN) 0.004 % SOLN ophthalmic solution   Both Eyes   Place 1 drop into both eyes at bedtime.             BP 182/90  Pulse 88  Temp(Src) 98 F (36.7 C) (Oral)  Resp 20  Ht 6\' 4"  (1.93 m)  Wt 330 lb (149.687 kg)  BMI 40.19 kg/m2  SpO2 95%  Physical Exam  Nursing note and vitals reviewed. Constitutional: He appears well-developed and well-nourished.  HENT:  Head: Normocephalic and atraumatic.  Buccal mucosa dry.  Eyes: Conjunctivae are normal.  Neck: Normal range of motion.  Cardiovascular: Normal rate, regular rhythm and intact distal pulses.   Pulmonary/Chest: Effort normal and breath sounds normal. No respiratory distress.  Abdominal: Soft. Bowel sounds are normal. He exhibits distension. There is no tenderness. There is no rebound.  Musculoskeletal: Normal range of motion. He exhibits no edema.  Brawny anterior tibial skin,  Left greater than right.  No erythema,  Trace edema.  Neurological: He is alert.  Skin: Skin is warm and dry.  Psychiatric:  Flat affect.    ED Course  Procedures (including critical care time)  Labs Reviewed  URINALYSIS, ROUTINE W REFLEX MICROSCOPIC - Abnormal; Notable for the following:    Hgb urine dipstick TRACE (*)    All other components within normal limits  BASIC METABOLIC PANEL - Abnormal; Notable for the following:    Potassium 3.4 (*)    Glucose, Bld 65 (*)    Creatinine, Ser 1.57 (*)    GFR calc non Af Amer 41 (*)    GFR calc Af Amer 47 (*)    All other components within normal limits  GLUCOSE, CAPILLARY - Abnormal; Notable for the following:    Glucose-Capillary 68 (*)    All other components within normal limits  URINE MICROSCOPIC-ADD ON   No results found.   1. Acute urinary retention     Pt was able to give about a 30 cc urine sample prior to insertion of catheter.  cbg 68 - snack with juice ordered.  850 cc of urine was collected in foley bag.  Patient comfortable at time of dc.  He was given a leg  bag.  MDM  Pt to call Dr Jerre Simon  for recheck this week for further management of his foley and his urinary retention. Discussed with Dr. Hyacinth Meeker who also saw patient.  Recheck cbg stable.  Patients labs and/or radiological studies were viewed and considered during the medical decision making and disposition process.         Burgess Amor, PA-C 04/09/13 1120

## 2013-04-09 NOTE — ED Notes (Signed)
While wheeling patient out of department, patient stated he was out of his prescription for Potassium 20 mEq tabs and was wanting to know if PA would write a prescription. Burgess Amor, PA-C consulted on patient request. Rx given for one week's worth of medication and patient advised to follow up with PCP this week. Verbal understanding obtained.

## 2013-04-09 NOTE — ED Notes (Signed)
Patient provided peanut butter crackers and ginger ale for blood sugar.

## 2013-04-09 NOTE — ED Notes (Signed)
Patient instructed on care of foley catheter. Leg bag placed and standard drainage bag provided to patient for nighttime use. Patient instructed on how to change out drainage systems. Verbal understanding obtained.  Patient with no complaints at this time. Respirations even and unlabored. Skin warm/dry. Discharge instructions reviewed with patient at this time. Patient given opportunity to voice concerns/ask questions. Patient discharged at this time and left Emergency Department via wheelchair.

## 2013-04-09 NOTE — ED Notes (Signed)
Patient voided 30 mls Yellow urine prior to foley catheter insertion.

## 2013-04-09 NOTE — ED Notes (Signed)
Pt reports "trouble w/ my water for 2 days", h/o of same and is followed by dr.javaid, stated can't empty his bladder.  Last urination 2 hours pta. Denies any fever or nausea.

## 2013-04-12 ENCOUNTER — Encounter (HOSPITAL_COMMUNITY): Payer: Self-pay

## 2013-04-12 ENCOUNTER — Emergency Department (HOSPITAL_COMMUNITY)
Admission: EM | Admit: 2013-04-12 | Discharge: 2013-04-12 | Discharge status: Against Medical Advice | Payer: Medicare Other

## 2013-04-12 ENCOUNTER — Emergency Department (HOSPITAL_COMMUNITY)
Admission: EM | Admit: 2013-04-12 | Discharge: 2013-04-12 | Disposition: A | Discharge status: Home / Self Care | Payer: Medicare Other | Attending: Emergency Medicine | Admitting: Emergency Medicine

## 2013-04-12 DIAGNOSIS — E039 Hypothyroidism, unspecified: Secondary | ICD-10-CM | POA: Insufficient documentation

## 2013-04-12 DIAGNOSIS — R300 Dysuria: Secondary | ICD-10-CM | POA: Insufficient documentation

## 2013-04-12 DIAGNOSIS — F3289 Other specified depressive episodes: Secondary | ICD-10-CM | POA: Insufficient documentation

## 2013-04-12 DIAGNOSIS — R339 Retention of urine, unspecified: Secondary | ICD-10-CM

## 2013-04-12 DIAGNOSIS — R109 Unspecified abdominal pain: Secondary | ICD-10-CM | POA: Insufficient documentation

## 2013-04-12 DIAGNOSIS — Z794 Long term (current) use of insulin: Secondary | ICD-10-CM | POA: Insufficient documentation

## 2013-04-12 DIAGNOSIS — E119 Type 2 diabetes mellitus without complications: Secondary | ICD-10-CM | POA: Insufficient documentation

## 2013-04-12 DIAGNOSIS — Z87448 Personal history of other diseases of urinary system: Secondary | ICD-10-CM | POA: Insufficient documentation

## 2013-04-12 DIAGNOSIS — N390 Urinary tract infection, site not specified: Secondary | ICD-10-CM

## 2013-04-12 DIAGNOSIS — R34 Anuria and oliguria: Secondary | ICD-10-CM | POA: Insufficient documentation

## 2013-04-12 DIAGNOSIS — Z8639 Personal history of other endocrine, nutritional and metabolic disease: Secondary | ICD-10-CM | POA: Insufficient documentation

## 2013-04-12 DIAGNOSIS — Z862 Personal history of diseases of the blood and blood-forming organs and certain disorders involving the immune mechanism: Secondary | ICD-10-CM | POA: Insufficient documentation

## 2013-04-12 DIAGNOSIS — Z79899 Other long term (current) drug therapy: Secondary | ICD-10-CM | POA: Insufficient documentation

## 2013-04-12 DIAGNOSIS — I10 Essential (primary) hypertension: Secondary | ICD-10-CM | POA: Insufficient documentation

## 2013-04-12 DIAGNOSIS — F329 Major depressive disorder, single episode, unspecified: Secondary | ICD-10-CM | POA: Insufficient documentation

## 2013-04-12 DIAGNOSIS — E669 Obesity, unspecified: Secondary | ICD-10-CM | POA: Insufficient documentation

## 2013-04-12 LAB — URINE MICROSCOPIC-ADD ON

## 2013-04-12 LAB — URINALYSIS, ROUTINE W REFLEX MICROSCOPIC
Nitrite: NEGATIVE
Specific Gravity, Urine: 1.015 (ref 1.005–1.030)
Urobilinogen, UA: 0.2 mg/dL (ref 0.0–1.0)

## 2013-04-12 MED ORDER — CIPROFLOXACIN HCL 500 MG PO TABS: 500.0000 mg | ORAL_TABLET | Freq: Two times a day (BID) | ORAL | Status: DC

## 2013-04-12 NOTE — ED Notes (Signed)
Pt reports had catheter removed today around 1pm and hasn't been able to void.  Pt denies pain but says feels like bladder is full.

## 2013-04-12 NOTE — ED Provider Notes (Signed)
History  This chart was scribed for Benny Lennert, MD by Bennett Scrape, ED Scribe. This patient was seen in room APAH2/APAH2 and the patient's care was started at 5:56 PM.  CSN: [id number]  Arrival date & time 04/12/13  1721   First MD Initiated Contact with Patient 04/12/13 1756      Chief Complaint  Patient presents with  . Urinary Retention     The history is provided by the patient. No language interpreter was used.    HPI Comments: Garrett Bradley is a 79 y.o. male who presents to the Emergency Department complaining of 4 hours of gradual onset, gradually worsening, constant urinary retention with mild associated suprapubic abdominal pain that started after he had his foley removed by Dr. Jerre Simon, urologist. He reports that he had the foley placed due to an enlarged prostate and was told that he would have to return for a follow up visit. He denies nausea, emesis and SOB as associated symptoms. He has a h/o HTN, DM, and hypothyroidism. Pt denies smoking and alcohol use.  Past Medical History  Diagnosis Date  . Hypertension   . Diabetes mellitus   . Hypothyroidism   . Depression   . Urinary problem   . Hypokalemia     Past Surgical History  Procedure Laterality Date  . Hernia repair    . Transurethral resection of prostate    . Transurethral resection of prostate  09/22/2011    Procedure: TRANSURETHRAL RESECTION OF THE PROSTATE (TURP);  Surgeon: Ky Barban;  Location: [facility];  Service: Urology;  Laterality: N/A;    Family History  Problem Relation Age of Onset  . Hypertension Mother   . Hypertension Brother   . Cerebral aneurysm Mother     History  Substance Use Topics  . Smoking status: Never Smoker   . Smokeless tobacco: Never Used  . Alcohol Use: No      Review of Systems  Constitutional: Negative for appetite change and fatigue.  HENT: Negative for congestion, sinus pressure and ear discharge.   Eyes: Negative for discharge.  Respiratory:  Negative for cough.   Cardiovascular: Negative for chest pain.  Gastrointestinal: Positive for abdominal pain (mild). Negative for vomiting and diarrhea.  Genitourinary: Positive for decreased urine volume and difficulty urinating. Negative for frequency and hematuria.  Musculoskeletal: Negative for back pain.  Skin: Negative for rash.  Neurological: Negative for seizures and headaches.  Psychiatric/Behavioral: Negative for hallucinations.    Allergies  Review of patient's allergies indicates no known allergies.  Home Medications   Current Outpatient Rx  Name  Route  Sig  Dispense  Refill  . ALPRAZolam (XANAX) 1 MG tablet   Oral   Take 1 mg by mouth at bedtime. For anxiety         . diltiazem (CARDIZEM CD) 300 MG 24 hr capsule   Oral   Take 300 mg by mouth daily.          . ferrous sulfate 325 (65 FE) MG tablet   Oral   Take 325 mg by mouth daily.          . fish oil-omega-3 fatty acids 1000 MG capsule   Oral   Take 1 capsule (1 g total) by mouth 2 (two) times daily.         . furosemide (LASIX) 40 MG tablet   Oral   Take 1 tablet (40 mg total) by mouth daily.   30 tablet   0   .  insulin detemir (LEVEMIR FLEXPEN) 100 UNIT/ML injection   Subcutaneous   Inject 70 Units into the skin at bedtime.          . insulin lispro (HUMALOG KWIKPEN) 100 UNIT/ML injection   Subcutaneous   Inject 20 Units into the skin 3 (three) times daily before meals.          Marland Kitchen levothyroxine (SYNTHROID, LEVOTHROID) 100 MCG tablet   Oral   Take 100 mcg by mouth daily.          Marland Kitchen linagliptin (TRADJENTA) 5 MG TABS tablet   Oral   Take 5 mg by mouth daily.          . niacin 500 MG tablet   Oral   Take 500 mg by mouth at bedtime.         . potassium chloride (K-DUR) 10 MEQ tablet   Oral   Take 2 tablets (20 mEq total) by mouth 2 (two) times daily.   14 tablet   0   . potassium chloride SA (K-DUR,KLOR-CON) 20 MEQ tablet   Oral   Take 2 tablets (40 mEq total) by mouth  every morning.   30 tablet   0   . terazosin (HYTRIN) 10 MG capsule   Oral   Take 1 capsule (10 mg total) by mouth at bedtime.   30 capsule   0   . Travoprost, BAK Free, (TRAVATAMN) 0.004 % SOLN ophthalmic solution   Both Eyes   Place 1 drop into both eyes at bedtime.             Triage Vitals: BP 133/90  Pulse 83  Temp(Src) 98.1 F (36.7 C) (Oral)  Resp 18  Ht 6\' 4"  (1.93 m)  Wt 330 lb (149.687 kg)  BMI 40.19 kg/m2  SpO2 96%  Physical Exam  Nursing note and vitals reviewed. Constitutional: He is oriented to person, place, and time. No distress.  Obese  HENT:  Head: Normocephalic and atraumatic.  Eyes: Conjunctivae and EOM are normal. No scleral icterus.  Neck: Neck supple. No thyromegaly present.  Cardiovascular: Normal rate and regular rhythm.  Exam reveals no gallop and no friction rub.   No murmur heard. Pulmonary/Chest: Effort normal. No stridor. He has no wheezes. He has no rales. He exhibits no tenderness.  Abdominal: He exhibits no distension. There is tenderness (minimal suprapubic tenderness). There is no rebound.  Musculoskeletal: Normal range of motion. He exhibits no edema.  Lymphadenopathy:    He has no cervical adenopathy.  Neurological: He is alert and oriented to person, place, and time. Coordination normal.  Skin: No rash noted. No erythema.  Psychiatric: He has a normal mood and affect. His behavior is normal.    ED Course  Procedures (including critical care time)  DIAGNOSTIC STUDIES: Oxygen Saturation is 96% on room air, normal by my interpretation.    COORDINATION OF CARE: 6:01 PM-Discussed treatment plan which includes insert foley catheter and UA with pt at bedside and pt agreed to plan.   Labs Reviewed - No data to display No results found.   No diagnosis found.    MDM    The chart was scribed for me under my direct supervision.  I personally performed the history, physical, and medical decision making and all procedures in  the evaluation of this patient.Benny Lennert, MD 04/12/13 [phone]

## 2013-04-14 LAB — URINE CULTURE

## 2013-04-15 ENCOUNTER — Telehealth (HOSPITAL_COMMUNITY): Payer: Self-pay | Admitting: Emergency Medicine

## 2013-04-15 NOTE — ED Notes (Signed)
Post ED Visit - Positive Culture Follow-up  Culture report reviewed by antimicrobial stewardship pharmacist: []  Wes Dulaney, Pharm.D., BCPS []  Celedonio Miyamoto, Pharm.D., BCPS [x]  Georgina Pillion, [address].D., BCPS []  [place], [place].D., BCPS, AAHIVP []  Estella Husk, Pharm.D., BCPS, AAHIV  Positive urine culture Treated with Cipro, organism sensitive to the same and no further patient follow-up is required at this time.  Kylie A Holland 04/15/2013, 5:24 PM

## 2013-04-27 ENCOUNTER — Emergency Department (HOSPITAL_COMMUNITY)
Admission: EM | Admit: 2013-04-27 | Discharge: 2013-04-27 | Discharge status: Against Medical Advice | Payer: Medicare Other

## 2013-06-01 ENCOUNTER — Encounter (HOSPITAL_COMMUNITY): Payer: Self-pay | Admitting: *Deleted

## 2013-06-01 ENCOUNTER — Emergency Department (HOSPITAL_COMMUNITY)
Admission: EM | Admit: 2013-06-01 | Discharge: 2013-06-01 | Disposition: A | Discharge status: Home / Self Care | Payer: Medicare Other | Attending: Emergency Medicine | Admitting: Emergency Medicine

## 2013-06-01 DIAGNOSIS — Z87448 Personal history of other diseases of urinary system: Secondary | ICD-10-CM | POA: Insufficient documentation

## 2013-06-01 DIAGNOSIS — Z794 Long term (current) use of insulin: Secondary | ICD-10-CM | POA: Insufficient documentation

## 2013-06-01 DIAGNOSIS — Z79899 Other long term (current) drug therapy: Secondary | ICD-10-CM | POA: Insufficient documentation

## 2013-06-01 DIAGNOSIS — F329 Major depressive disorder, single episode, unspecified: Secondary | ICD-10-CM | POA: Insufficient documentation

## 2013-06-01 DIAGNOSIS — I10 Essential (primary) hypertension: Secondary | ICD-10-CM | POA: Insufficient documentation

## 2013-06-01 DIAGNOSIS — R5381 Other malaise: Secondary | ICD-10-CM | POA: Insufficient documentation

## 2013-06-01 DIAGNOSIS — R339 Retention of urine, unspecified: Secondary | ICD-10-CM

## 2013-06-01 DIAGNOSIS — E1169 Type 2 diabetes mellitus with other specified complication: Secondary | ICD-10-CM | POA: Insufficient documentation

## 2013-06-01 DIAGNOSIS — R630 Anorexia: Secondary | ICD-10-CM | POA: Insufficient documentation

## 2013-06-01 DIAGNOSIS — Z862 Personal history of diseases of the blood and blood-forming organs and certain disorders involving the immune mechanism: Secondary | ICD-10-CM | POA: Insufficient documentation

## 2013-06-01 DIAGNOSIS — F3289 Other specified depressive episodes: Secondary | ICD-10-CM | POA: Insufficient documentation

## 2013-06-01 DIAGNOSIS — Z8639 Personal history of other endocrine, nutritional and metabolic disease: Secondary | ICD-10-CM | POA: Insufficient documentation

## 2013-06-01 DIAGNOSIS — E039 Hypothyroidism, unspecified: Secondary | ICD-10-CM | POA: Insufficient documentation

## 2013-06-01 LAB — URINALYSIS, ROUTINE W REFLEX MICROSCOPIC
Nitrite: NEGATIVE
Specific Gravity, Urine: 1.015 (ref 1.005–1.030)
Urobilinogen, UA: 0.2 mg/dL (ref 0.0–1.0)

## 2013-06-01 LAB — GLUCOSE, CAPILLARY: Glucose-Capillary: 49 mg/dL — ABNORMAL LOW (ref 70–99)

## 2013-06-01 NOTE — ED Provider Notes (Signed)
History     This chart was scribed for Flint Melter, MD by Jiles Prows, ED Scribe. The patient was seen in room APA06/APA06 and the patient's care was started at 10:32 AM.   CSN: [id number] Arrival date & time 06/01/13  1015  Chief Complaint  Patient presents with  . Urinary Retention  The history is provided by the patient and medical records. No language interpreter was used.   HPI Comments: Garrett Bradley is a 79 y.o. male with a h/o HTN, DM, and urinary problems who presents to the Emergency Department complaining of inability to urinate onset last night.  Pt reports he has a catheter in for a month that was removed the 14th of June.  He reports that for 2 weeks after removal of catheter he was able to urinate with no problem.  He states now he is feeling weak, has no appetite, and feels  tight in his abdomen.  Pt denies headache, diaphoresis, fever, chills, nausea, vomiting, diarrhea, weakness, cough, SOB and any other pain.  Dr. Jerre Simon is Urologist. Past Medical History  Diagnosis Date  . Hypertension   . Diabetes mellitus   . Hypothyroidism   . Depression   . Urinary problem   . Hypokalemia    Past Surgical History  Procedure Laterality Date  . Hernia repair    . Transurethral resection of prostate    . Transurethral resection of prostate  09/22/2011    Procedure: TRANSURETHRAL RESECTION OF THE PROSTATE (TURP);  Surgeon: Ky Barban;  Location: [facility];  Service: Urology;  Laterality: N/A;   Family History  Problem Relation Age of Onset  . Hypertension Mother   . Hypertension Brother   . Cerebral aneurysm Mother    History  Substance Use Topics  . Smoking status: Never Smoker   . Smokeless tobacco: Never Used  . Alcohol Use: No    Review of Systems  Constitutional: Positive for appetite change.  Genitourinary: Positive for difficulty urinating.  Neurological: Positive for weakness.  All other systems reviewed and are negative.    Allergies  Review of  patient's allergies indicates no known allergies.  Home Medications   Current Outpatient Rx  Name  Route  Sig  Dispense  Refill  . ALPRAZolam (XANAX) 1 MG tablet   Oral   Take 1 mg by mouth at bedtime as needed for sleep.          Marland Kitchen diltiazem (CARDIZEM CD) 300 MG 24 hr capsule   Oral   Take 300 mg by mouth daily.          . ferrous sulfate 325 (65 FE) MG tablet   Oral   Take 325 mg by mouth daily.          . fish oil-omega-3 fatty acids 1000 MG capsule   Oral   Take 1 capsule (1 g total) by mouth 2 (two) times daily.         . furosemide (LASIX) 40 MG tablet   Oral   Take 1 tablet (40 mg total) by mouth daily.   30 tablet   0   . insulin detemir (LEVEMIR FLEXPEN) 100 UNIT/ML injection   Subcutaneous   Inject 70 Units into the skin at bedtime.          . insulin lispro (HUMALOG KWIKPEN) 100 UNIT/ML injection   Subcutaneous   Inject 20 Units into the skin 3 (three) times daily before meals.          Marland Kitchen  levothyroxine (SYNTHROID, LEVOTHROID) 100 MCG tablet   Oral   Take 100 mcg by mouth daily.          Marland Kitchen linagliptin (TRADJENTA) 5 MG TABS tablet   Oral   Take 5 mg by mouth daily.          . niacin 500 MG tablet   Oral   Take 500 mg by mouth at bedtime.         . potassium chloride SA (K-DUR,KLOR-CON) 20 MEQ tablet   Oral   Take 2 tablets (40 mEq total) by mouth every morning.   30 tablet   0   . tamsulosin (FLOMAX) 0.4 MG CAPS   Oral   Take 0.4 mg by mouth daily.         Marland Kitchen terazosin (HYTRIN) 10 MG capsule   Oral   Take 1 capsule (10 mg total) by mouth at bedtime.   30 capsule   0   . Travoprost, BAK Free, (TRAVATAN) 0.004 % SOLN ophthalmic solution   Both Eyes   Place 1 drop into both eyes at bedtime.          BP 155/75  Pulse 75  Temp(Src) 98.1 F (36.7 C) (Oral)  Resp 16  Ht 6\' 4"  (1.93 m)  Wt 300 lb (136.079 kg)  BMI 36.53 kg/m2  SpO2 94% Physical Exam  Nursing note and vitals reviewed. Constitutional: He is oriented  to person, place, and time. He appears well-developed and well-nourished.  HENT:  Head: Normocephalic and atraumatic.  Right Ear: External ear normal.  Left Ear: External ear normal.  Eyes: Conjunctivae and EOM are normal. Pupils are equal, round, and reactive to light.  Neck: Normal range of motion and phonation normal. Neck supple.  Cardiovascular: Normal rate, regular rhythm, normal heart sounds and intact distal pulses.   Pulmonary/Chest: Effort normal and breath sounds normal. He exhibits no bony tenderness.  Abdominal: Soft. Normal appearance. He exhibits no distension and no mass. There is no tenderness.  Genitourinary: Testes normal and penis normal. Right testis shows no mass and no tenderness. Left testis shows no mass and no tenderness. Circumcised. No penile tenderness. No discharge found.  No costovertebral angle tenderness.  Musculoskeletal: Normal range of motion.  Neurological: He is alert and oriented to person, place, and time. He has normal strength. No cranial nerve deficit or sensory deficit. He exhibits normal muscle tone. Coordination normal.  Skin: Skin is warm, dry and intact.  Psychiatric: He has a normal mood and affect. His behavior is normal. Judgment and thought content normal.    ED Course  Procedures (including critical care time)   Patient Vitals for the past 24 hrs:  BP Pulse Resp SpO2  06/01/13 1448 153/76 mmHg 92 20 96 %   10:39 AM - Discussed ED treatment with pt at bedside including urinalysis and pt agrees.   Foley placed per nursing with clear yellow urine obtained.  1145- CBG checked, 41; Hypoglycemia. Pt is lucid and asymptomatic. Oral nutrition ordered.  1254- After eating, tolerated well, CBG is improved, normal, 86. Pt remains asymptomatic. Foley draining well.  CRITICAL CARE Performed by: Flint Melter Total critical care time: 35 minutes Treatment of hypoglycemia with oral nutrition and monitoring of CBG for  normalization. Critical care time was exclusive of separately billable procedures and treating other patients. Critical care was necessary to treat or prevent imminent or life-threatening deterioration. Critical care was time spent personally by me on the following activities: development of treatment plan  with patient and/or surrogate as well as nursing, discussions with consultants, evaluation of patient's response to treatment, examination of patient, obtaining history from patient or surrogate, ordering and performing treatments and interventions, ordering and review of laboratory studies, ordering and review of radiographic studies, pulse oximetry and re-evaluation of patient's condition.       Labs Reviewed  URINALYSIS, ROUTINE W REFLEX MICROSCOPIC - Abnormal; Notable for the following:    Protein, ur TRACE (*)    Leukocytes, UA SMALL (*)    All other components within normal limits  URINE MICROSCOPIC-ADD ON - Abnormal; Notable for the following:    Bacteria, UA MANY (*)    All other components within normal limits  GLUCOSE, CAPILLARY - Abnormal; Notable for the following:    Glucose-Capillary 41 (*)    All other components within normal limits  GLUCOSE, CAPILLARY - Abnormal; Notable for the following:    Glucose-Capillary 49 (*)    All other components within normal limits  URINE CULTURE  GLUCOSE, CAPILLARY   No results found. 1. Urinary retention   2. Hypoglycemia due to insulin, initial encounter     MDM  Recurrrent bladder outlet obstruction, apparently d/t prostatic enlargement. Incidental hypoglycemia, treated in ED. Pt improved with treatment. Doubt metabolic instability, serious bacterial infection or impending vascular collapse; the patient is stable for discharge.  Nursing Notes Reviewed/ Care Coordinated Applicable Imaging Reviewed Interpretation of Laboratory Data incorporated into ED treatment  Plan: Home Medications- no new ; Home Treatments- Usual meds and  Foley care; return here if the recommended treatment, does not improve the symptoms; Recommended follow up- Urology f/u in 1 week.   I personally performed the services described in this documentation, which was scribed in my presence. The recorded information has been reviewed and is accurate.   Flint Melter, MD 06/02/13 1116

## 2013-06-01 NOTE — ED Notes (Addendum)
Pt reports he took his insulin this am without eating anything. Pt CBG assessed and returned a result of 41. Pt given orange juice and graham crackers. Dietary called and an order for a diabetic meal tray was placed. Pt aware and verbalized understanding. Pt not presenting with any s/s of hypoglycemic at this time. Charge RN aware. Will re-assess pt CBG.

## 2013-06-01 NOTE — ED Notes (Addendum)
Re-assessed pt CBG. CBG now 49. Still awaiting meal tray. EDP aware and given verbal order to repeat orange juice, and peanut butter crackers. Pt reports fatigue but is asymptomatic otherwise. Will re-assess blood sugar in 15 minutes.

## 2013-06-01 NOTE — ED Notes (Signed)
Urinary retention since waking this morning.  Last time voiding was last night.

## 2013-06-01 NOTE — ED Notes (Signed)
BGL checked at request of nurse.

## 2013-06-03 LAB — URINE CULTURE: Colony Count: >=100000

## 2013-06-04 ENCOUNTER — Telehealth (HOSPITAL_COMMUNITY): Payer: Self-pay | Admitting: Emergency Medicine

## 2013-06-04 IMAGING — CR DG CHEST 2V
3 series · 3 of 3 positions shown · non-contrast
Comparison: 02/12/2013

CLINICAL DATA: Weakness.  Hypertension.  Diabetes.

CHEST - 2 VIEW

[view not recorded (1 of 3)]
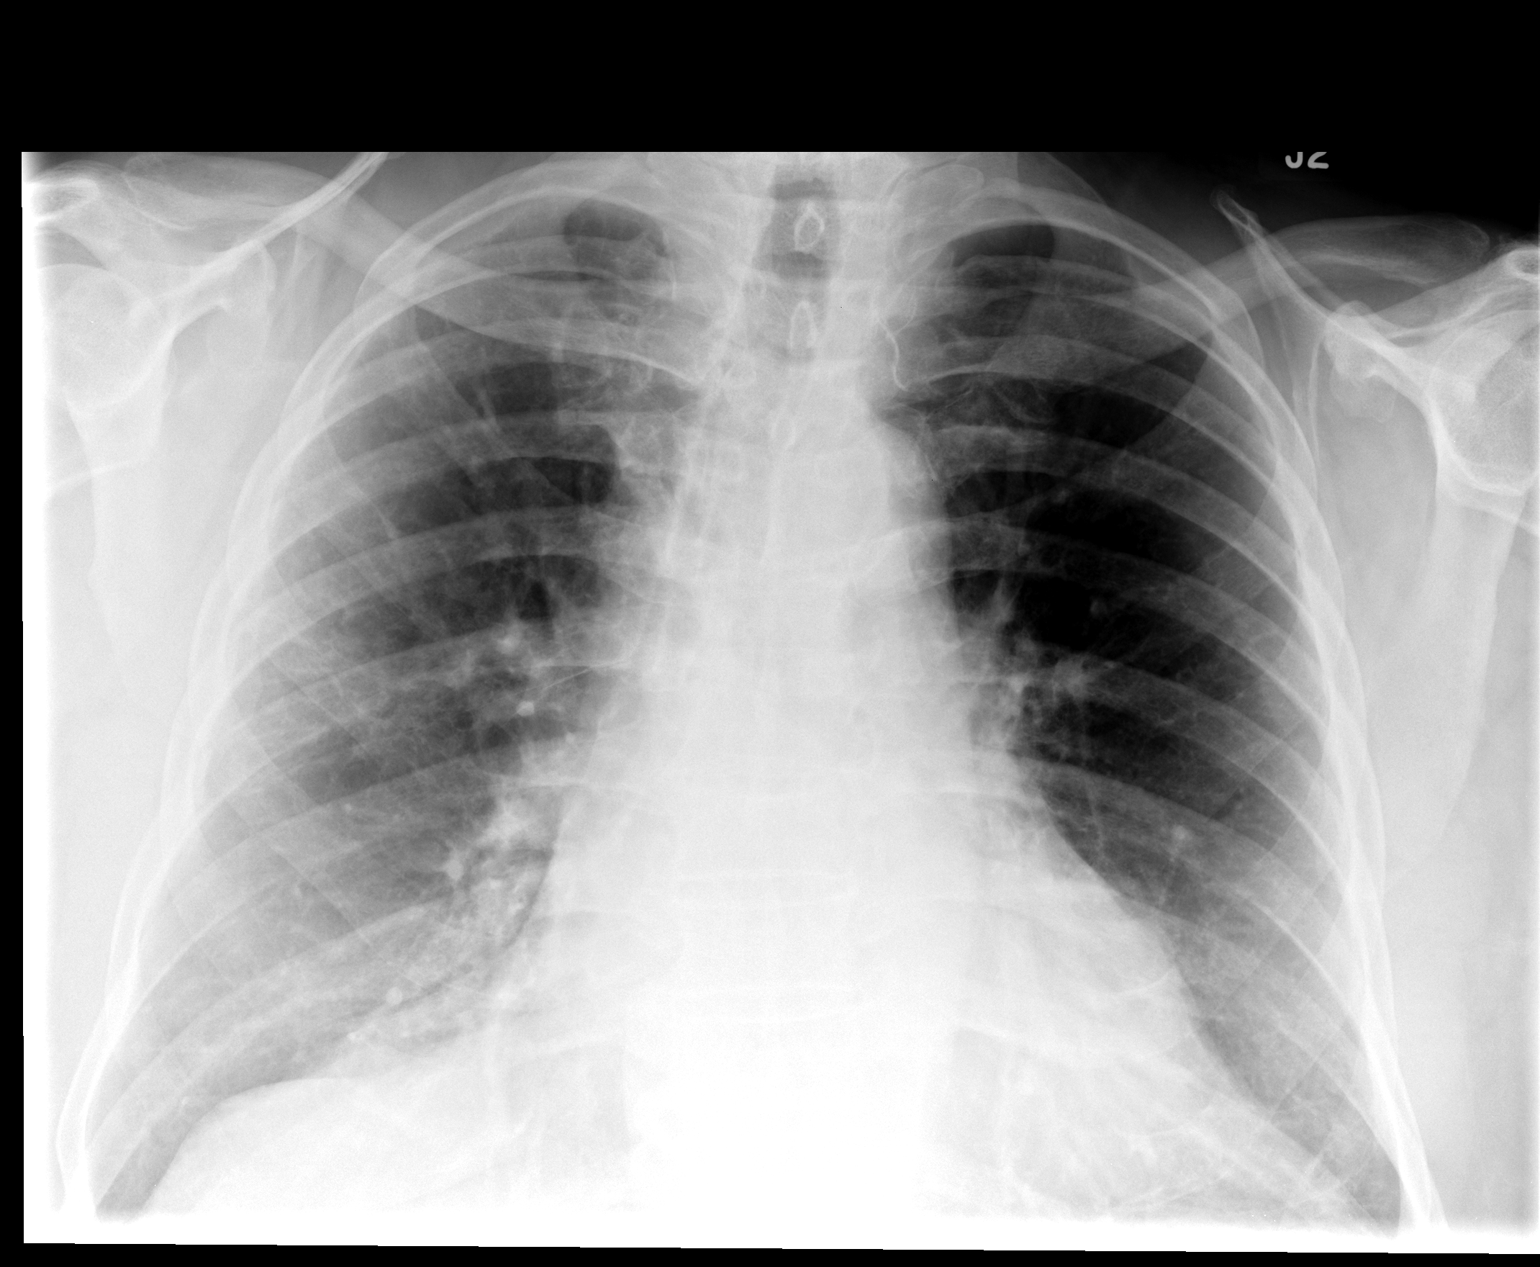

[view not recorded (2 of 3)]
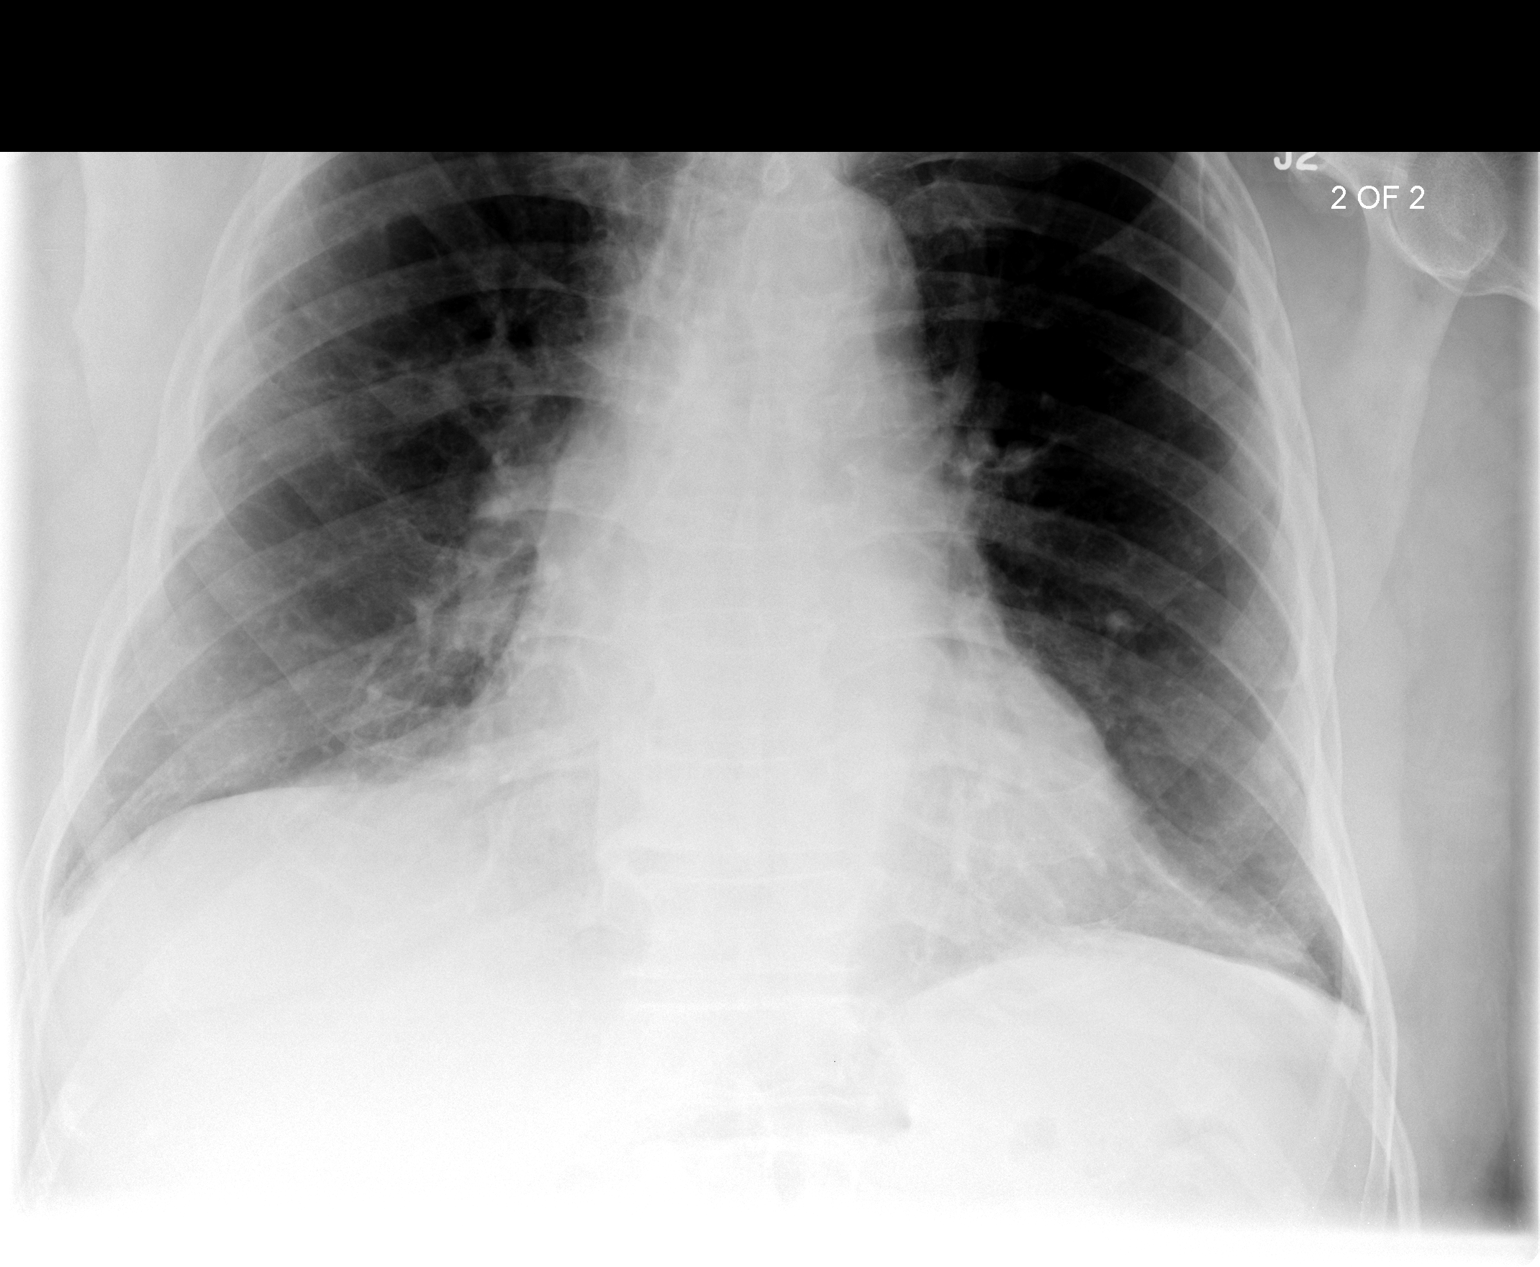

[view not recorded (3 of 3)]
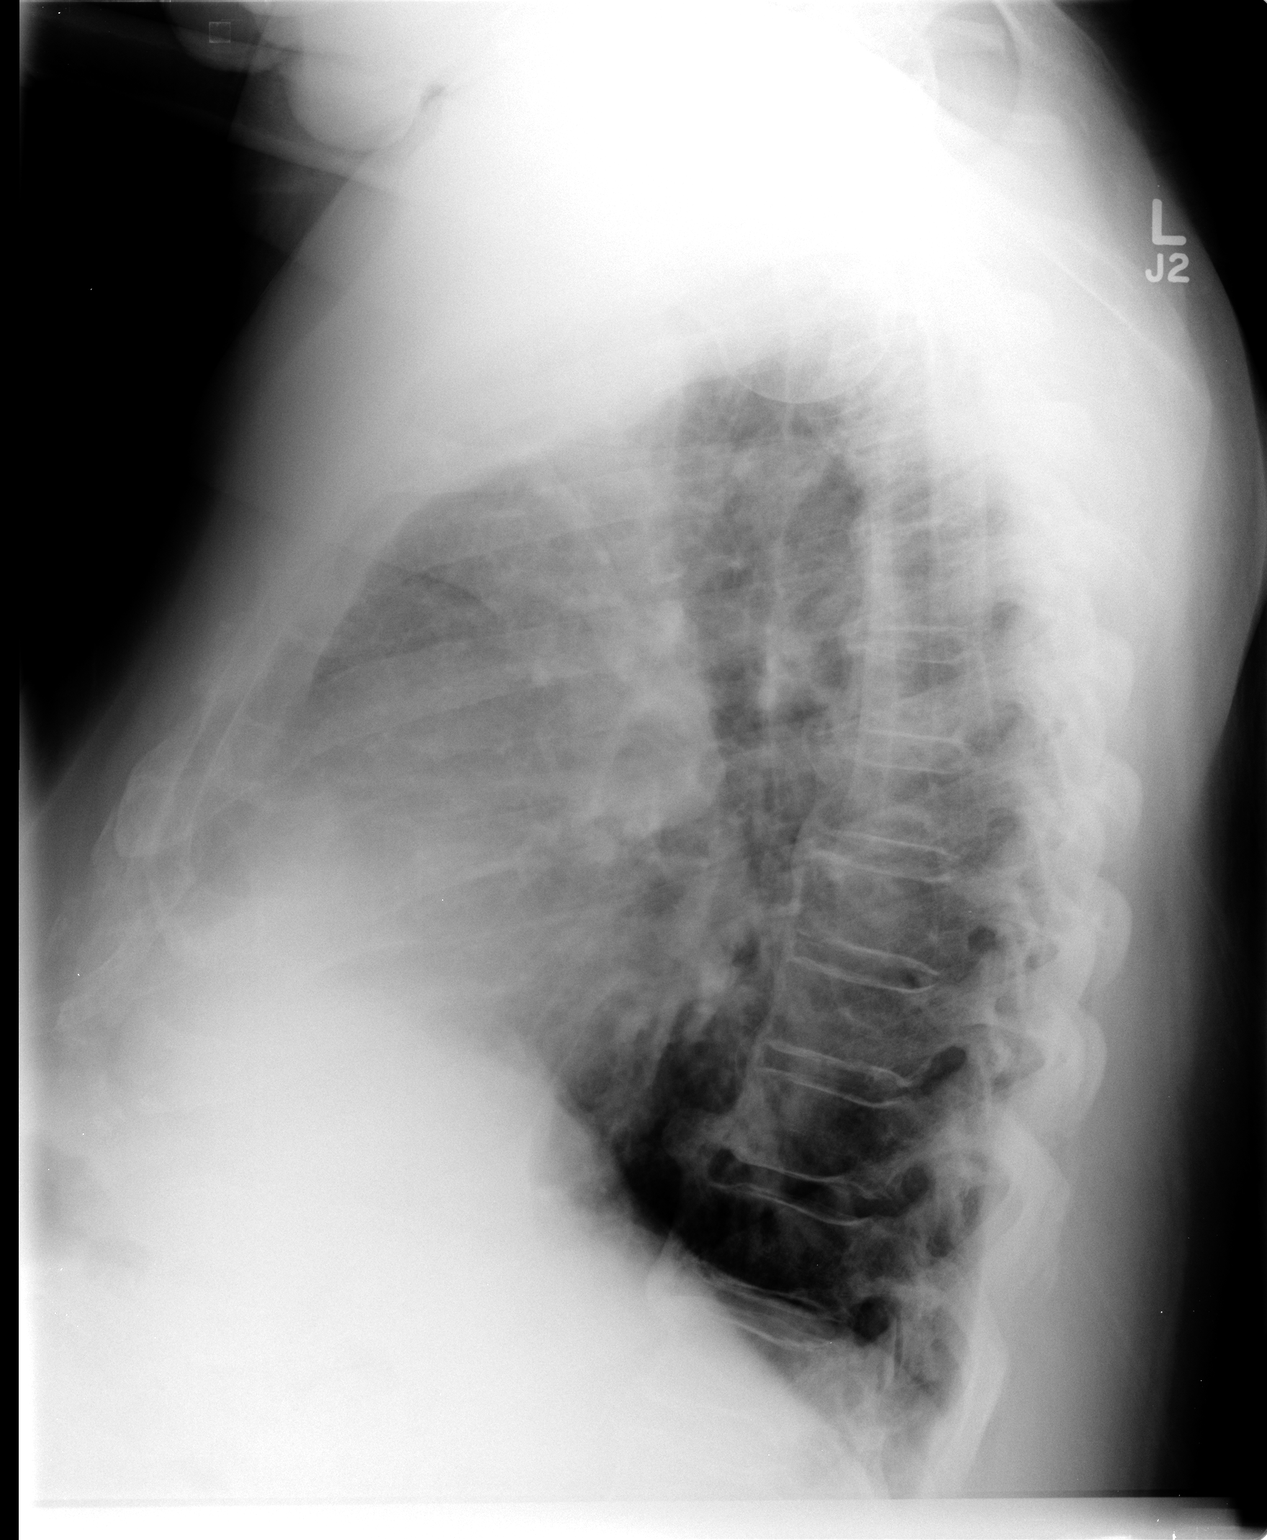

[3 of 3 positions shown; findings below may reference images not displayed]

FINDINGS: Mild cardiomegaly noted with mild indistinctness of
pulmonary vasculature suspicious for pulmonary venous hypertension.
No overt edema.  Thoracic spondylosis is present.  Mild
atherosclerotic calcification of the aortic arch is observed.
IMPRESSION: 1.  Cardiomegaly with mildly indistinct pulmonary vasculature
suggesting pulmonary venous hypertension.  No overt edema.

## 2013-06-04 NOTE — ED Notes (Signed)
Post ED Visit - Positive Culture Follow-up  Culture report reviewed by antimicrobial stewardship pharmacist: []  Wes Dulaney, Pharm.D., BCPS []  Celedonio Miyamoto, Pharm.D., BCPS []  Georgina Pillion, Pharm.D., BCPS []  Hopkins, [place].D., BCPS, AAHIVP []  Estella Husk, Pharm.D., BCPS, AAHIVP [x]  Okey Regal, Pharm.D., BCPS  Positive urine culture Likely contaminant, no treatment needed and no further patient follow-up is required at this time.  Kylie A Holland 06/04/2013, 6:02 PM

## 2013-06-07 ENCOUNTER — Other Ambulatory Visit (HOSPITAL_COMMUNITY): Payer: Self-pay | Admitting: Nephrology

## 2013-06-07 DIAGNOSIS — N289 Disorder of kidney and ureter, unspecified: Secondary | ICD-10-CM

## 2013-06-10 ENCOUNTER — Emergency Department (HOSPITAL_COMMUNITY): Payer: Medicare Other

## 2013-06-10 ENCOUNTER — Encounter (HOSPITAL_COMMUNITY): Payer: Self-pay | Admitting: *Deleted

## 2013-06-10 ENCOUNTER — Emergency Department (HOSPITAL_COMMUNITY)
Admission: EM | Admit: 2013-06-10 | Discharge: 2013-06-10 | Disposition: A | Discharge status: Home / Self Care | Payer: Medicare Other | Attending: Emergency Medicine | Admitting: Emergency Medicine

## 2013-06-10 DIAGNOSIS — E039 Hypothyroidism, unspecified: Secondary | ICD-10-CM | POA: Insufficient documentation

## 2013-06-10 DIAGNOSIS — K5900 Constipation, unspecified: Secondary | ICD-10-CM | POA: Insufficient documentation

## 2013-06-10 DIAGNOSIS — Z8639 Personal history of other endocrine, nutritional and metabolic disease: Secondary | ICD-10-CM | POA: Insufficient documentation

## 2013-06-10 DIAGNOSIS — Z87448 Personal history of other diseases of urinary system: Secondary | ICD-10-CM | POA: Insufficient documentation

## 2013-06-10 DIAGNOSIS — E119 Type 2 diabetes mellitus without complications: Secondary | ICD-10-CM | POA: Insufficient documentation

## 2013-06-10 DIAGNOSIS — Z794 Long term (current) use of insulin: Secondary | ICD-10-CM | POA: Insufficient documentation

## 2013-06-10 DIAGNOSIS — R339 Retention of urine, unspecified: Secondary | ICD-10-CM

## 2013-06-10 DIAGNOSIS — Z9889 Other specified postprocedural states: Secondary | ICD-10-CM | POA: Insufficient documentation

## 2013-06-10 DIAGNOSIS — R109 Unspecified abdominal pain: Secondary | ICD-10-CM | POA: Insufficient documentation

## 2013-06-10 DIAGNOSIS — F329 Major depressive disorder, single episode, unspecified: Secondary | ICD-10-CM | POA: Insufficient documentation

## 2013-06-10 DIAGNOSIS — F3289 Other specified depressive episodes: Secondary | ICD-10-CM | POA: Insufficient documentation

## 2013-06-10 DIAGNOSIS — I10 Essential (primary) hypertension: Secondary | ICD-10-CM | POA: Insufficient documentation

## 2013-06-10 DIAGNOSIS — Z862 Personal history of diseases of the blood and blood-forming organs and certain disorders involving the immune mechanism: Secondary | ICD-10-CM | POA: Insufficient documentation

## 2013-06-10 DIAGNOSIS — Z79899 Other long term (current) drug therapy: Secondary | ICD-10-CM | POA: Insufficient documentation

## 2013-06-10 MED ORDER — PEG 3350-KCL-NA BICARB-NACL 420 G PO SOLR
4000.0000 mL | Freq: Once | ORAL | Status: AC
  Administered 2013-06-10: 4000 mL via ORAL
  Filled 2013-06-10: qty 4000

## 2013-06-10 NOTE — ED Provider Notes (Signed)
History    CSN: [id number] Arrival date & time 06/10/13  0202  None    Chief Complaint  Patient presents with  . Constipation   (Consider location/radiation/quality/duration/timing/severity/associated sxs/prior Treatment) HPI HPI Comments: Garrett Bradley is a 79 y.o. male with a h/o HTN, DM, urinary retention ( with foley catheter) who presents to the Emergency Department complaining of constipation since Thursday. He has used MIralax and magnesium citrate with little results since Thursday. Has occasional lower abdominal pain. Denies fever, chills, nausea, vomiting, shortness of breath.   PCP Dr. Phillips Odor Urologist Dr. Jerre Simon Nephrologist Dr. Fausto Skillern  Past Medical History  Diagnosis Date  . Hypertension   . Diabetes mellitus   . Hypothyroidism   . Depression   . Urinary problem   . Hypokalemia    Past Surgical History  Procedure Laterality Date  . Hernia repair    . Transurethral resection of prostate    . Transurethral resection of prostate  09/22/2011    Procedure: TRANSURETHRAL RESECTION OF THE PROSTATE (TURP);  Surgeon: Ky Barban;  Location: [facility];  Service: Urology;  Laterality: N/A;   Family History  Problem Relation Age of Onset  . Hypertension Mother   . Hypertension Brother   . Cerebral aneurysm Mother    History  Substance Use Topics  . Smoking status: Never Smoker   . Smokeless tobacco: Never Used  . Alcohol Use: No    Review of Systems  Constitutional: Negative for fever.       10 Systems reviewed and are negative for acute change except as noted in the HPI.  HENT: Negative for congestion.   Eyes: Negative for discharge and redness.  Respiratory: Negative for cough and shortness of breath.   Cardiovascular: Negative for chest pain.  Gastrointestinal: Positive for constipation. Negative for vomiting and abdominal pain.  Musculoskeletal: Negative for back pain.  Skin: Negative for rash.  Neurological: Negative for syncope, numbness and  headaches.  Psychiatric/Behavioral:       No behavior change.    Allergies  Review of patient's allergies indicates no known allergies.  Home Medications   Current Outpatient Rx  Name  Route  Sig  Dispense  Refill  . ALPRAZolam (XANAX) 1 MG tablet   Oral   Take 1 mg by mouth at bedtime as needed for sleep.          Marland Kitchen diltiazem (CARDIZEM CD) 300 MG 24 hr capsule   Oral   Take 300 mg by mouth daily.          . ferrous sulfate 325 (65 FE) MG tablet   Oral   Take 325 mg by mouth daily.          . fish oil-omega-3 fatty acids 1000 MG capsule   Oral   Take 1 capsule (1 g total) by mouth 2 (two) times daily.         . furosemide (LASIX) 40 MG tablet   Oral   Take 1 tablet (40 mg total) by mouth daily.   30 tablet   0   . insulin detemir (LEVEMIR FLEXPEN) 100 UNIT/ML injection   Subcutaneous   Inject 70 Units into the skin at bedtime.          . insulin lispro (HUMALOG KWIKPEN) 100 UNIT/ML injection   Subcutaneous   Inject 20 Units into the skin 3 (three) times daily before meals.          Marland Kitchen levothyroxine (SYNTHROID, LEVOTHROID) 100 MCG tablet  Oral   Take 100 mcg by mouth daily.          Marland Kitchen linagliptin (TRADJENTA) 5 MG TABS tablet   Oral   Take 5 mg by mouth daily.          . niacin 500 MG tablet   Oral   Take 500 mg by mouth at bedtime.         . potassium chloride SA (K-DUR,KLOR-CON) 20 MEQ tablet   Oral   Take 2 tablets (40 mEq total) by mouth every morning.   30 tablet   0   . tamsulosin (FLOMAX) 0.4 MG CAPS   Oral   Take 0.4 mg by mouth daily.         Marland Kitchen terazosin (HYTRIN) 10 MG capsule   Oral   Take 1 capsule (10 mg total) by mouth at bedtime.   30 capsule   0   . Travoprost, BAK Free, (TRAVATAN) 0.004 % SOLN ophthalmic solution   Both Eyes   Place 1 drop into both eyes at bedtime.          BP 138/64  Pulse 75  Temp(Src) 98.1 F (36.7 C) (Oral)  Resp 16  Ht 6\' 4"  (1.93 m)  Wt 310 lb (140.615 kg)  BMI 37.75 kg/m2   SpO2 94% Physical Exam  Nursing note and vitals reviewed. Constitutional: He appears well-developed and well-nourished.  Awake, alert, nontoxic appearance.  HENT:  Head: Atraumatic.  Eyes: Right eye exhibits no discharge. Left eye exhibits no discharge.  Neck: Neck supple.  Cardiovascular: Normal rate and intact distal pulses.   Pulmonary/Chest: Effort normal and breath sounds normal. He exhibits no tenderness.  Abdominal: Soft. Bowel sounds are normal. There is no tenderness. There is no rebound.  Genitourinary:  Indwelling foley catheter.  Musculoskeletal: He exhibits no tenderness.  Baseline ROM, no obvious new focal weakness.  Neurological:  Mental status and motor strength appears baseline for patient and situation.  Skin: No rash noted.  Psychiatric: He has a normal mood and affect.    ED Course  Procedures (including critical care time) Medications  polyethylene glycol-electrolytes (NuLYTELY/GoLYTELY) solution 4,000 mL (not administered)      Dg Abd 1 View  06/10/2013   *RADIOLOGY REPORT*  Clinical Data: Abdominal pain and constipation.  ABDOMEN - 1 VIEW  Comparison: 09/28/2011  Findings: Scattered gas and stool in the colon.  No small or large bowel distension.  No free intra-abdominal air.  Calcification projected to the left of the L4 appears stable since previous study.  Calcifications projected over the right kidney also stable. These could represent renal stones or ureteral stones.  Calcified phleboliths in the pelvis.  Degenerative changes in the spine.  IMPRESSION: Nonobstructive bowel gas pattern.  Stable bilateral calcifications may represent renal or ureteral stones.   Original Report Authenticated By: Burman Nieves, M.D.   MDM  Patient who presents with constipation since Thursday. He has used a laxative without success. KUB without obstruction or large amount of stool. Discussed use of Nulytely with the patient. Pt stable in ED with no significant deterioration  in condition.The patient appears reasonably screened and/or stabilized for discharge and I doubt any other medical condition or other [facility] requiring further screening, evaluation, or treatment in the ED at this time prior to discharge.  MDM Reviewed: nursing note and vitals Interpretation: x-ray     Nicoletta Dress. Colon Branch, MD 06/10/13 1610

## 2013-06-10 NOTE — ED Notes (Signed)
Pt states his bowel have locked up. Last bowel movement was Thursday.

## 2013-06-19 ENCOUNTER — Emergency Department (HOSPITAL_COMMUNITY): Payer: Medicare Other

## 2013-06-19 ENCOUNTER — Emergency Department (HOSPITAL_COMMUNITY)
Admission: EM | Admit: 2013-06-19 | Discharge: 2013-06-19 | Disposition: A | Discharge status: Home / Self Care | Payer: Medicare Other | Attending: Emergency Medicine | Admitting: Emergency Medicine

## 2013-06-19 ENCOUNTER — Encounter (HOSPITAL_COMMUNITY): Payer: Self-pay | Admitting: Emergency Medicine

## 2013-06-19 DIAGNOSIS — R609 Edema, unspecified: Secondary | ICD-10-CM | POA: Insufficient documentation

## 2013-06-19 DIAGNOSIS — Z862 Personal history of diseases of the blood and blood-forming organs and certain disorders involving the immune mechanism: Secondary | ICD-10-CM | POA: Insufficient documentation

## 2013-06-19 DIAGNOSIS — E039 Hypothyroidism, unspecified: Secondary | ICD-10-CM | POA: Insufficient documentation

## 2013-06-19 DIAGNOSIS — N400 Enlarged prostate without lower urinary tract symptoms: Secondary | ICD-10-CM | POA: Insufficient documentation

## 2013-06-19 DIAGNOSIS — Z8639 Personal history of other endocrine, nutritional and metabolic disease: Secondary | ICD-10-CM | POA: Insufficient documentation

## 2013-06-19 DIAGNOSIS — E119 Type 2 diabetes mellitus without complications: Secondary | ICD-10-CM | POA: Insufficient documentation

## 2013-06-19 DIAGNOSIS — R300 Dysuria: Secondary | ICD-10-CM | POA: Insufficient documentation

## 2013-06-19 DIAGNOSIS — F3289 Other specified depressive episodes: Secondary | ICD-10-CM | POA: Insufficient documentation

## 2013-06-19 DIAGNOSIS — Z794 Long term (current) use of insulin: Secondary | ICD-10-CM | POA: Insufficient documentation

## 2013-06-19 DIAGNOSIS — Z79899 Other long term (current) drug therapy: Secondary | ICD-10-CM | POA: Insufficient documentation

## 2013-06-19 DIAGNOSIS — I10 Essential (primary) hypertension: Secondary | ICD-10-CM | POA: Insufficient documentation

## 2013-06-19 DIAGNOSIS — F329 Major depressive disorder, single episode, unspecified: Secondary | ICD-10-CM | POA: Insufficient documentation

## 2013-06-19 DIAGNOSIS — R197 Diarrhea, unspecified: Secondary | ICD-10-CM | POA: Insufficient documentation

## 2013-06-19 HISTORY — DX: Benign prostatic hyperplasia without lower urinary tract symptoms: N40.0

## 2013-06-19 LAB — CBC WITH DIFFERENTIAL/PLATELET
Eosinophils Absolute: 0.1 10*3/uL (ref 0.0–0.7)
HCT: 39.3 % (ref 39.0–52.0)
Hemoglobin: 12.9 g/dL — ABNORMAL LOW (ref 13.0–17.0)
Lymphs Abs: 1.2 10*3/uL (ref 0.7–4.0)
MCH: 28.5 pg (ref 26.0–34.0)
MCV: 86.8 fL (ref 78.0–100.0)
Monocytes Absolute: 0.7 10*3/uL (ref 0.1–1.0)
Monocytes Relative: 11 % (ref 3–12)
Neutrophils Relative %: 68 % (ref 43–77)
RBC: 4.53 MIL/uL (ref 4.22–5.81)

## 2013-06-19 LAB — BASIC METABOLIC PANEL
BUN: 15 mg/dL (ref 6–23)
GFR calc non Af Amer: 47 mL/min — ABNORMAL LOW (ref >90)
Glucose, Bld: 107 mg/dL — ABNORMAL HIGH (ref 70–99)
Potassium: 3.2 mEq/L — ABNORMAL LOW (ref 3.5–5.1)

## 2013-06-19 NOTE — ED Provider Notes (Addendum)
History  This chart was scribed for Garrett Jakes, MD by Bennett Scrape, ED Scribe. This patient was seen in room [facility] and the patient's care was started at 9:51 AM.  CSN: [id number]  Arrival date & time 06/19/13  0930   First MD Initiated Contact with Patient 06/19/13 [phone]     Chief Complaint  Patient presents with  . Diarrhea    Patient is a 79 y.o. male presenting with diarrhea. The history is provided by the patient. No language interpreter was used.  Diarrhea Quality:  Watery Severity:  Moderate Onset quality:  Gradual Number of episodes:  10 Duration:  1 day Timing:  Constant Progression:  Unchanged Associated symptoms: no abdominal pain, no chills, no fever, no headaches and no vomiting   Risk factors: no sick contacts     HPI Comments: Garrett Bradley is a 79 y.o. male who presents to the Emergency Department complaining of diarrhea that started yesterday. He reports more than 10 non-bloody episodes since the onset and states that the first two BMs were more formed than the others. He admits that he took several laxatives including citric magnesium, Murelax and milk of magnesium yesterday thinking that he was constipated. Wife states that the pt has to take laxatives to have BMs. He denies abdominal pain, nausea and emesis as associated symptoms. Pt reports that he has a leg bag for urinary retention placed 2 weeks ago. He is following up with Dr. Frann Rider for an enlarged prostate and last appointment was 5 days ago. He also has a h/o DM, HTN and hypothyroidism. Pt denies smoking and alcohol use.  Past Medical History  Diagnosis Date  . Hypertension   . Diabetes mellitus   . Hypothyroidism   . Depression   . Urinary problem   . Hypokalemia   . Enlarged prostate    Past Surgical History  Procedure Laterality Date  . Hernia repair    . Transurethral resection of prostate    . Transurethral resection of prostate  09/22/2011    Procedure: TRANSURETHRAL  RESECTION OF THE PROSTATE (TURP);  Surgeon: Ky Barban;  Location: [facility];  Service: Urology;  Laterality: N/A;   Family History  Problem Relation Age of Onset  . Hypertension Mother   . Hypertension Brother   . Cerebral aneurysm Mother    History  Substance Use Topics  . Smoking status: Never Smoker   . Smokeless tobacco: Never Used  . Alcohol Use: No    Review of Systems  Constitutional: Negative for fever and chills.  HENT: Negative for congestion, sore throat and neck pain.   Eyes: Negative for visual disturbance.  Respiratory: Negative for cough and shortness of breath.   Cardiovascular: Positive for leg swelling (mild). Negative for chest pain.  Gastrointestinal: Positive for diarrhea. Negative for nausea, vomiting and abdominal pain.  Genitourinary: Positive for difficulty urinating. Negative for dysuria.  Musculoskeletal: Negative for back pain.  Skin: Negative for rash.  Neurological: Negative for headaches.  Hematological: Does not bruise/bleed easily.  Psychiatric/Behavioral: Negative for confusion.    Allergies  Review of patient's allergies indicates no known allergies.  Home Medications   Current Outpatient Rx  Name  Route  Sig  Dispense  Refill  . ALPRAZolam (XANAX) 1 MG tablet   Oral   Take 1 mg by mouth at bedtime as needed for sleep.          Marland Kitchen diltiazem (CARDIZEM CD) 300 MG 24 hr capsule   Oral  Take 300 mg by mouth daily.          . ferrous sulfate 325 (65 FE) MG tablet   Oral   Take 325 mg by mouth daily.          . fish oil-omega-3 fatty acids 1000 MG capsule   Oral   Take 1 capsule (1 g total) by mouth 2 (two) times daily.         . furosemide (LASIX) 40 MG tablet   Oral   Take 1 tablet (40 mg total) by mouth daily.   30 tablet   0   . insulin detemir (LEVEMIR FLEXPEN) 100 UNIT/ML injection   Subcutaneous   Inject 70 Units into the skin at bedtime.          . insulin lispro (HUMALOG KWIKPEN) 100 UNIT/ML  injection   Subcutaneous   Inject 20 Units into the skin 3 (three) times daily before meals.          Marland Kitchen levothyroxine (SYNTHROID, LEVOTHROID) 100 MCG tablet   Oral   Take 100 mcg by mouth daily.          Marland Kitchen linagliptin (TRADJENTA) 5 MG TABS tablet   Oral   Take 5 mg by mouth daily.          . niacin 500 MG tablet   Oral   Take 500 mg by mouth at bedtime.         . potassium chloride SA (K-DUR,KLOR-CON) 20 MEQ tablet   Oral   Take 20 mEq by mouth every morning.         . tamsulosin (FLOMAX) 0.4 MG CAPS   Oral   Take 0.4 mg by mouth daily.         Marland Kitchen terazosin (HYTRIN) 10 MG capsule   Oral   Take 1 capsule (10 mg total) by mouth at bedtime.   30 capsule   0   . Travoprost, BAK Free, (TRAVATAN) 0.004 % SOLN ophthalmic solution   Both Eyes   Place 1 drop into both eyes at bedtime.          Triage Vitals: BP 164/91  Pulse 88  Temp(Src) 98.1 F (36.7 C) (Oral)  Resp 20  Ht 6\' 4"  (1.93 m)  Wt 310 lb (140.615 kg)  BMI 37.75 kg/m2  SpO2 97%  Physical Exam  Nursing note and vitals reviewed. Constitutional: He is oriented to person, place, and time. He appears well-developed and well-nourished. No distress.  HENT:  Head: Normocephalic and atraumatic.  Mouth/Throat: Oropharynx is clear and moist.  Moist MM  Eyes: Conjunctivae and EOM are normal. Pupils are equal, round, and reactive to light.  Sclera are clear  Neck: Neck supple. No tracheal deviation present.  Cardiovascular: Normal rate and regular rhythm.   No murmur heard. Pulses:      Dorsalis pedis pulses are 2+ on the right side, and 2+ on the left side.  Pulmonary/Chest: Effort normal and breath sounds normal. No respiratory distress. He has no wheezes.  Abdominal: Soft. Bowel sounds are normal. He exhibits no distension. There is no tenderness.  Musculoskeletal: Normal range of motion. He exhibits edema (1+ pitting edema bilaterally ).  Lymphadenopathy:    He has no cervical adenopathy.   Neurological: He is alert and oriented to person, place, and time. No cranial nerve deficit.  Pt able to move both sets of fingers and toes  Skin: Skin is warm and dry. No rash noted.  Psychiatric: He has a  normal mood and affect. His behavior is normal.    ED Course  Procedures (including critical care time)  DIAGNOSTIC STUDIES: Oxygen Saturation is 97% on room air, normal by my interpretation.    COORDINATION OF CARE: 10:11 AM-Discussed treatment plan which includes xray of abdomen and chest, CBC panel and BMP with pt at bedside and pt agreed to plan.   Labs Reviewed  CBC WITH DIFFERENTIAL - Abnormal; Notable for the following:    Hemoglobin 12.9 (*)    All other components within normal limits  BASIC METABOLIC PANEL - Abnormal; Notable for the following:    Potassium 3.2 (*)    CO2 35 (*)    Glucose, Bld 107 (*)    Creatinine, Ser 1.39 (*)    GFR calc non Af Amer 47 (*)    GFR calc Af Amer 55 (*)    All other components within normal limits  GLUCOSE, CAPILLARY   Dg Abd Acute W/chest  06/19/2013   *RADIOLOGY REPORT*  Clinical Data: Constipation.  History of diabetes and hypertension.  ACUTE ABDOMEN SERIES (ABDOMEN 2 VIEW & CHEST 1 VIEW)  Comparison: 03/29/2013 and 06/10/2013.  Findings: The heart size and mediastinal contours are stable. There is stable mild chronic lung disease without superimposed edema, airspace disease or pleural effusion.  The bowel gas pattern is normal.  There is no free intraperitoneal air.  The patient has a known horseshoe kidney.  Left renal calculi are again noted.  Additional calcifications overlying the left kidney were shown to be within the retroperitoneum on prior CT and appear unchanged.  Bilateral pelvic phleboliths are grossly unchanged.  There is stable lumbar spondylosis.  IMPRESSION: No active cardiopulmonary or abdominal process.  Abdominal calcifications appear grossly unchanged.   Original Report Authenticated By: Carey Bullocks, M.D.    Results for orders placed during the hospital encounter of 06/19/13  CBC WITH DIFFERENTIAL      Result Value Range   WBC 6.4  4.0 - 10.5 K/uL   RBC 4.53  4.22 - 5.81 MIL/uL   Hemoglobin 12.9 (*) 13.0 - 17.0 g/dL   HCT 21.3  08.6 - 57.8 %   MCV 86.8  78.0 - 100.0 fL   MCH 28.5  26.0 - 34.0 pg   MCHC 32.8  30.0 - 36.0 g/dL   RDW 46.9  62.9 - 52.8 %   Platelets 189  150 - 400 K/uL   Neutrophils Relative % 68  43 - 77 %   Neutro Abs 4.3  1.7 - 7.7 K/uL   Lymphocytes Relative 19  12 - 46 %   Lymphs Abs 1.2  0.7 - 4.0 K/uL   Monocytes Relative 11  3 - 12 %   Monocytes Absolute 0.7  0.1 - 1.0 K/uL   Eosinophils Relative 2  0 - 5 %   Eosinophils Absolute 0.1  0.0 - 0.7 K/uL   Basophils Relative 1  0 - 1 %   Basophils Absolute 0.0  0.0 - 0.1 K/uL  BASIC METABOLIC PANEL      Result Value Range   Sodium 141  135 - 145 mEq/L   Potassium 3.2 (*) 3.5 - 5.1 mEq/L   Chloride 100  96 - 112 mEq/L   CO2 35 (*) 19 - 32 mEq/L   Glucose, Bld 107 (*) 70 - 99 mg/dL   BUN 15  6 - 23 mg/dL   Creatinine, Ser 4.13 (*) 0.50 - 1.35 mg/dL   Calcium 9.4  8.4 - 24.4 mg/dL  GFR calc non Af Amer 47 (*) >90 mL/min   GFR calc Af Amer 55 (*) >90 mL/min  GLUCOSE, CAPILLARY      Result Value Range   Glucose-Capillary 99  70 - 99 mg/dL    1. Diarrhea     MDM  The patient's workup without any significant abnormalities. Some mild hypokalemia discussed with the patient. Patient's diarrhea is most likely due to excessive laxative use yesterday and last evening. Plain films without any evidence of additional constipation. Symptoms clearly not consistent with obstruction. No nausea no vomiting.   I personally performed the services described in this documentation, which was scribed in my presence. The recorded information has been reviewed and is accurate.       Garrett Jakes, MD 06/19/13 1609  Garrett Jakes, MD 06/19/13 [phone]

## 2013-06-19 NOTE — ED Notes (Signed)
CBG is 99. °

## 2013-06-19 NOTE — ED Notes (Signed)
Patient c/o diarrhea since 1 this morning. Denies any nausea, vomiting, blood in stool, abd pain, or fevers. Per patient every time he eats or drinks he has a loose BM. Reports approx 10 loose stools.

## 2013-06-19 NOTE — ED Notes (Signed)
MD at bedside. 

## 2013-06-22 ENCOUNTER — Ambulatory Visit (HOSPITAL_COMMUNITY)
Admission: RE | Admit: 2013-06-22 | Discharge: 2013-06-22 | Disposition: A | Discharge status: Home / Self Care | Payer: Medicare Other | Source: Ambulatory Visit | Attending: Nephrology | Admitting: Nephrology

## 2013-06-22 DIAGNOSIS — I10 Essential (primary) hypertension: Secondary | ICD-10-CM | POA: Insufficient documentation

## 2013-06-22 DIAGNOSIS — N289 Disorder of kidney and ureter, unspecified: Secondary | ICD-10-CM

## 2013-06-22 DIAGNOSIS — E119 Type 2 diabetes mellitus without complications: Secondary | ICD-10-CM | POA: Insufficient documentation

## 2013-06-22 DIAGNOSIS — Q619 Cystic kidney disease, unspecified: Secondary | ICD-10-CM | POA: Insufficient documentation

## 2013-07-01 ENCOUNTER — Encounter (HOSPITAL_COMMUNITY): Payer: Self-pay | Admitting: *Deleted

## 2013-07-01 ENCOUNTER — Emergency Department (HOSPITAL_COMMUNITY)
Admission: EM | Admit: 2013-07-01 | Discharge: 2013-07-01 | Disposition: A | Discharge status: Home / Self Care | Payer: Medicare Other | Attending: Emergency Medicine | Admitting: Emergency Medicine

## 2013-07-01 DIAGNOSIS — E039 Hypothyroidism, unspecified: Secondary | ICD-10-CM | POA: Insufficient documentation

## 2013-07-01 DIAGNOSIS — Z79899 Other long term (current) drug therapy: Secondary | ICD-10-CM | POA: Insufficient documentation

## 2013-07-01 DIAGNOSIS — Z794 Long term (current) use of insulin: Secondary | ICD-10-CM | POA: Insufficient documentation

## 2013-07-01 DIAGNOSIS — F3289 Other specified depressive episodes: Secondary | ICD-10-CM | POA: Insufficient documentation

## 2013-07-01 DIAGNOSIS — F329 Major depressive disorder, single episode, unspecified: Secondary | ICD-10-CM | POA: Insufficient documentation

## 2013-07-01 DIAGNOSIS — Z862 Personal history of diseases of the blood and blood-forming organs and certain disorders involving the immune mechanism: Secondary | ICD-10-CM | POA: Insufficient documentation

## 2013-07-01 DIAGNOSIS — Z8639 Personal history of other endocrine, nutritional and metabolic disease: Secondary | ICD-10-CM | POA: Insufficient documentation

## 2013-07-01 DIAGNOSIS — E119 Type 2 diabetes mellitus without complications: Secondary | ICD-10-CM | POA: Insufficient documentation

## 2013-07-01 DIAGNOSIS — Z87448 Personal history of other diseases of urinary system: Secondary | ICD-10-CM | POA: Insufficient documentation

## 2013-07-01 DIAGNOSIS — K5900 Constipation, unspecified: Secondary | ICD-10-CM | POA: Insufficient documentation

## 2013-07-01 DIAGNOSIS — I10 Essential (primary) hypertension: Secondary | ICD-10-CM | POA: Insufficient documentation

## 2013-07-01 MED ORDER — DOCUSATE SODIUM 100 MG PO CAPS: 100.0000 mg | ORAL_CAPSULE | Freq: Two times a day (BID) | ORAL | Status: DC

## 2013-07-01 NOTE — ED Notes (Signed)
Pt comes from home with multiple complaints: pt sts he is constipated but not now; "every time I eat I get blockage" Pt sts LBM was this morning at 11 am "I drink orange juice, it went right through me" Also sts "at 2:00 o'clock this morning was last time I had real bowel movement" Pt and wife aslo stating they want pt to be "checked for prostate". Pt reports he thinks that it's his prostate that is making him constipated. Also reports poor appetite.

## 2013-07-01 NOTE — ED Provider Notes (Signed)
CSN: [id number]     Arrival date & time 07/01/13  1159 History     First MD Initiated Contact with Patient 07/01/13 1206     Chief Complaint  Patient presents with  . multiple complaints   (Consider location/radiation/quality/duration/timing/severity/associated sxs/prior Treatment) The history is provided by the patient.   patient reports she's been in with constipation for several months now.  When he becomes constipated he takes milk of magnesia and this is followed by loose stools.  He is concerned about his fluctuating loose stools followed by constipation.  Denies fevers and chills.  No nausea and vomiting.  His last bowel movement was at 2:00 in the morning.  His had several loose stools since then.  No melena or hematochezia.  No other complaints.  He has not followed up with his primary care physician or a GI specialist regarding this.  His last colonoscopy was in 2010 performed and refill [place].  No difficulty urinating.  No other complaints.  Past Medical History  Diagnosis Date  . Hypertension   . Diabetes mellitus   . Hypothyroidism   . Depression   . Urinary problem   . Hypokalemia   . Enlarged prostate    Past Surgical History  Procedure Laterality Date  . Hernia repair    . Transurethral resection of prostate    . Transurethral resection of prostate  09/22/2011    Procedure: TRANSURETHRAL RESECTION OF THE PROSTATE (TURP);  Surgeon: Ky Barban;  Location: [facility];  Service: Urology;  Laterality: N/A;   Family History  Problem Relation Age of Onset  . Hypertension Mother   . Hypertension Brother   . Cerebral aneurysm Mother    History  Substance Use Topics  . Smoking status: Never Smoker   . Smokeless tobacco: Never Used  . Alcohol Use: No    Review of Systems  All other systems reviewed and are negative.    Allergies  Review of patient's allergies indicates no known allergies.  Home Medications   Current Outpatient Rx  Name  Route   Sig  Dispense  Refill  . ALPRAZolam (XANAX) 1 MG tablet   Oral   Take 1 mg by mouth at bedtime as needed for sleep.          Marland Kitchen diltiazem (CARDIZEM CD) 300 MG 24 hr capsule   Oral   Take 300 mg by mouth daily.          . ferrous sulfate 325 (65 FE) MG tablet   Oral   Take 325 mg by mouth daily.          . fish oil-omega-3 fatty acids 1000 MG capsule   Oral   Take 1 capsule (1 g total) by mouth 2 (two) times daily.         . furosemide (LASIX) 40 MG tablet   Oral   Take 1 tablet (40 mg total) by mouth daily.   30 tablet   0   . insulin detemir (LEVEMIR FLEXPEN) 100 UNIT/ML injection   Subcutaneous   Inject 70 Units into the skin at bedtime.          . insulin lispro (HUMALOG KWIKPEN) 100 UNIT/ML injection   Subcutaneous   Inject 20 Units into the skin 3 (three) times daily before meals.          Marland Kitchen levothyroxine (SYNTHROID, LEVOTHROID) 100 MCG tablet   Oral   Take 100 mcg by mouth daily.          Marland Kitchen  linagliptin (TRADJENTA) 5 MG TABS tablet   Oral   Take 5 mg by mouth daily.          . niacin 500 MG tablet   Oral   Take 500 mg by mouth at bedtime.         . potassium chloride SA (K-DUR,KLOR-CON) 20 MEQ tablet   Oral   Take 20 mEq by mouth every morning.         . tamsulosin (FLOMAX) 0.4 MG CAPS   Oral   Take 0.4 mg by mouth daily.         Marland Kitchen terazosin (HYTRIN) 10 MG capsule   Oral   Take 1 capsule (10 mg total) by mouth at bedtime.   30 capsule   0   . Travoprost, BAK Free, (TRAVATAN) 0.004 % SOLN ophthalmic solution   Both Eyes   Place 1 drop into both eyes at bedtime.         . docusate sodium (COLACE) 100 MG capsule   Oral   Take 1 capsule (100 mg total) by mouth every 12 (twelve) hours.   60 capsule   0    BP 152/65  Pulse 86  Temp(Src) 98.4 F (36.9 C) (Oral)  Resp 20  SpO2 97% Physical Exam  Nursing note and vitals reviewed. Constitutional: He is oriented to person, place, and time. He appears well-developed and  well-nourished.  HENT:  Head: Normocephalic and atraumatic.  Eyes: EOM are normal.  Neck: Normal range of motion.  Cardiovascular: Normal rate, regular rhythm, normal heart sounds and intact distal pulses.   Pulmonary/Chest: Effort normal and breath sounds normal. No respiratory distress.  Abdominal: Soft. He exhibits no distension and no mass. There is no tenderness. There is no rebound and no guarding.  Genitourinary: Rectum normal.  Normal rectal exam.  No rectal mass or tenderness.  Prostate normal.  Stool around.  No gross blood  Musculoskeletal: Normal range of motion.  Neurological: He is alert and oriented to person, place, and time.  Skin: Skin is warm and dry.  Psychiatric: He has a normal mood and affect. Judgment normal.    ED Course   Procedures (including critical care time)  Labs Reviewed - No data to display No results found. 1. Constipation     MDM  Postoperative patient magnesia and instead switch the patient is something softer such as Colace.  pcp  followup.  GI followup  Lyanne Co, MD 07/01/13 1248

## 2013-08-05 ENCOUNTER — Emergency Department (HOSPITAL_COMMUNITY): Payer: Medicare Other

## 2013-08-05 ENCOUNTER — Emergency Department (HOSPITAL_COMMUNITY)
Admission: EM | Admit: 2013-08-05 | Discharge: 2013-08-05 | Disposition: A | Discharge status: Home / Self Care | Payer: Medicare Other | Attending: Emergency Medicine | Admitting: Emergency Medicine

## 2013-08-05 ENCOUNTER — Encounter (HOSPITAL_COMMUNITY): Payer: Self-pay | Admitting: *Deleted

## 2013-08-05 DIAGNOSIS — N451 Epididymitis: Secondary | ICD-10-CM

## 2013-08-05 DIAGNOSIS — E039 Hypothyroidism, unspecified: Secondary | ICD-10-CM | POA: Insufficient documentation

## 2013-08-05 DIAGNOSIS — N509 Disorder of male genital organs, unspecified: Secondary | ICD-10-CM | POA: Insufficient documentation

## 2013-08-05 DIAGNOSIS — N5089 Other specified disorders of the male genital organs: Secondary | ICD-10-CM | POA: Insufficient documentation

## 2013-08-05 DIAGNOSIS — F3289 Other specified depressive episodes: Secondary | ICD-10-CM | POA: Insufficient documentation

## 2013-08-05 DIAGNOSIS — Z87448 Personal history of other diseases of urinary system: Secondary | ICD-10-CM | POA: Insufficient documentation

## 2013-08-05 DIAGNOSIS — F329 Major depressive disorder, single episode, unspecified: Secondary | ICD-10-CM | POA: Insufficient documentation

## 2013-08-05 DIAGNOSIS — N453 Epididymo-orchitis: Secondary | ICD-10-CM | POA: Insufficient documentation

## 2013-08-05 DIAGNOSIS — I10 Essential (primary) hypertension: Secondary | ICD-10-CM | POA: Insufficient documentation

## 2013-08-05 DIAGNOSIS — Z794 Long term (current) use of insulin: Secondary | ICD-10-CM | POA: Insufficient documentation

## 2013-08-05 DIAGNOSIS — E876 Hypokalemia: Secondary | ICD-10-CM | POA: Insufficient documentation

## 2013-08-05 DIAGNOSIS — Z79899 Other long term (current) drug therapy: Secondary | ICD-10-CM | POA: Insufficient documentation

## 2013-08-05 DIAGNOSIS — E119 Type 2 diabetes mellitus without complications: Secondary | ICD-10-CM | POA: Insufficient documentation

## 2013-08-05 LAB — CBC WITH DIFFERENTIAL/PLATELET
Basophils Relative: 0 % (ref 0–1)
Eosinophils Absolute: 0.1 10*3/uL (ref 0.0–0.7)
Eosinophils Relative: 1 % (ref 0–5)
Hemoglobin: 11.8 g/dL — ABNORMAL LOW (ref 13.0–17.0)
Lymphs Abs: 1.4 10*3/uL (ref 0.7–4.0)
MCH: 29.2 pg (ref 26.0–34.0)
MCV: 88.1 fL (ref 78.0–100.0)
Monocytes Relative: 11 % (ref 3–12)
Neutro Abs: 7.6 10*3/uL (ref 1.7–7.7)
Platelets: 164 10*3/uL (ref 150–400)
RBC: 4.04 MIL/uL — ABNORMAL LOW (ref 4.22–5.81)

## 2013-08-05 LAB — BASIC METABOLIC PANEL
BUN: 18 mg/dL (ref 6–23)
Calcium: 9.2 mg/dL (ref 8.4–10.5)
GFR calc non Af Amer: 49 mL/min — ABNORMAL LOW (ref >90)
Glucose, Bld: 85 mg/dL (ref 70–99)

## 2013-08-05 MED ORDER — LEVOFLOXACIN IN D5W 750 MG/150ML IV SOLN
750.0000 mg | Freq: Once | INTRAVENOUS | Status: DC
  Filled 2013-08-05: qty 150

## 2013-08-05 MED ORDER — LIDOCAINE HCL (PF) 1 % IJ SOLN
INTRAMUSCULAR | Status: AC
  Filled 2013-08-05: qty 5

## 2013-08-05 MED ORDER — CEFTRIAXONE SODIUM 1 G IJ SOLR
1.0000 g | Freq: Once | INTRAMUSCULAR | Status: AC
  Administered 2013-08-05: 1 g via INTRAMUSCULAR
  Filled 2013-08-05: qty 10

## 2013-08-05 MED ORDER — CIPROFLOXACIN HCL 500 MG PO TABS: 500.0000 mg | ORAL_TABLET | Freq: Two times a day (BID) | ORAL | Status: DC

## 2013-08-05 MED ORDER — LEVOFLOXACIN 750 MG PO TABS
750.0000 mg | ORAL_TABLET | Freq: Once | ORAL | Status: AC
  Administered 2013-08-05: 750 mg via ORAL
  Filled 2013-08-05: qty 1

## 2013-08-05 NOTE — ED Notes (Addendum)
Non-painful scrotal swelling x 1 week. States it is not as bad as when first started. Had been put on Lisinopril on 07/17/13.  Saw Dr. Phillips Odor today who told him to stop taking med as it could be cause of swelling.  Also told him to come to ED for [place].

## 2013-08-05 NOTE — ED Notes (Signed)
CN to assess for IV access

## 2013-08-05 NOTE — ED Notes (Signed)
Pt states that he was sent to the er from Dr. Lamar Blinks office for further evaluation of scrotum swelling that started a week ago, was recently placed on new medication lisinopril, was seen at dr office this am, advised to stop the lisinopril and come to er for ?ultrasound of scrotum area. Pt denies any pain, denies any problems with urination. States that he wears a foley bag and has not had any problems

## 2013-08-05 NOTE — ED Notes (Signed)
Attempted x 2 with IV access without success

## 2013-08-05 NOTE — ED Provider Notes (Signed)
CSN: [id number]     Arrival date & time 08/05/13  1008 History  This chart was scribed for Garrett Lennert, MD by Quintella Reichert, ED scribe.  This patient was seen in room APA09/APA09 and the patient's care was started at 10:40 AM.    Chief Complaint  Patient presents with  . Scrotal Swelling    HPI Comments: TED GOODNER is a 79 y.o. male with h/o HTN, DM, enlarged prostate and hypothyroidism who presents to the Emergency Department complaining of 1 week of gradual-onset non-painful scrotal swelling that reached its most severe yesterday and has improved slightly today.  Pt notes that he was placed on Lisinopril on 07/17/13 and was advised by his PCP that this medication may be the cause of his swelling.  PCP also advised pt to come to the ED for [place].  He denies fever, chills, nausea, vomiting or any other associated symptoms.  Pt is not on dialysis.   Patient is a 79 y.o. male presenting with testicular pain. The history is provided by the patient. No language interpreter was used.  Testicle Pain This is a new (Non-painful scrotal swelling) problem. Episode onset: 1 week. The problem occurs constantly. Progression since onset: gradually worsened until yesterday and improved slightly since then. Pertinent negatives include no chest pain, no abdominal pain, no headaches and no shortness of breath. Nothing aggravates the symptoms. Nothing relieves the symptoms. He has tried nothing for the symptoms.    Past Medical History  Diagnosis Date  . Hypertension   . Diabetes mellitus   . Hypothyroidism   . Depression   . Urinary problem   . Hypokalemia   . Enlarged prostate     Past Surgical History  Procedure Laterality Date  . Hernia repair    . Transurethral resection of prostate    . Transurethral resection of prostate  09/22/2011    Procedure: TRANSURETHRAL RESECTION OF THE PROSTATE (TURP);  Surgeon: Ky Barban;  Location: [facility];  Service: Urology;  Laterality: N/A;     Family History  Problem Relation Age of Onset  . Hypertension Mother   . Hypertension Brother   . Cerebral aneurysm Mother     History  Substance Use Topics  . Smoking status: Never Smoker   . Smokeless tobacco: Never Used  . Alcohol Use: No     Review of Systems  Constitutional: Negative for fever, chills, appetite change and fatigue.  HENT: Negative for congestion, sinus pressure and ear discharge.   Eyes: Negative for discharge.  Respiratory: Negative for cough and shortness of breath.   Cardiovascular: Negative for chest pain.  Gastrointestinal: Negative for nausea, vomiting, abdominal pain and diarrhea.  Genitourinary: Positive for scrotal swelling. Negative for frequency and hematuria.  Musculoskeletal: Negative for back pain.  Skin: Negative for rash.  Neurological: Negative for seizures and headaches.  Psychiatric/Behavioral: Negative for hallucinations.      Allergies  Review of patient's allergies indicates no known allergies.  Home Medications   Current Outpatient Rx  Name  Route  Sig  Dispense  Refill  . ALPRAZolam (XANAX) 1 MG tablet   Oral   Take 1 mg by mouth at bedtime as needed for sleep.          Marland Kitchen diltiazem (CARDIZEM CD) 300 MG 24 hr capsule   Oral   Take 300 mg by mouth daily.          Marland Kitchen docusate sodium (COLACE) 100 MG capsule   Oral   Take  1 capsule (100 mg total) by mouth every 12 (twelve) hours.   60 capsule   0   . ferrous sulfate 325 (65 FE) MG tablet   Oral   Take 325 mg by mouth daily.          . fish oil-omega-3 fatty acids 1000 MG capsule   Oral   Take 1 capsule (1 g total) by mouth 2 (two) times daily.         . furosemide (LASIX) 40 MG tablet   Oral   Take 1 tablet (40 mg total) by mouth daily.   30 tablet   0   . insulin detemir (LEVEMIR FLEXPEN) 100 UNIT/ML injection   Subcutaneous   Inject 70 Units into the skin at bedtime.          . insulin lispro (HUMALOG KWIKPEN) 100 UNIT/ML injection    Subcutaneous   Inject 20 Units into the skin 3 (three) times daily before meals.          Marland Kitchen levothyroxine (SYNTHROID, LEVOTHROID) 100 MCG tablet   Oral   Take 100 mcg by mouth daily.          Marland Kitchen linagliptin (TRADJENTA) 5 MG TABS tablet   Oral   Take 5 mg by mouth daily.          . niacin 500 MG tablet   Oral   Take 500 mg by mouth at bedtime.         . potassium chloride SA (K-DUR,KLOR-CON) 20 MEQ tablet   Oral   Take 20 mEq by mouth every morning.         . tamsulosin (FLOMAX) 0.4 MG CAPS   Oral   Take 0.4 mg by mouth daily.         Marland Kitchen terazosin (HYTRIN) 10 MG capsule   Oral   Take 1 capsule (10 mg total) by mouth at bedtime.   30 capsule   0   . Travoprost, BAK Free, (TRAVATAN) 0.004 % SOLN ophthalmic solution   Both Eyes   Place 1 drop into both eyes at bedtime.          BP 137/63  Pulse 72  Temp(Src) 99 F (37.2 C) (Oral)  Resp 18  Ht 6\' 4"  (1.93 m)  Wt 312 lb (141.522 kg)  BMI 37.99 kg/m2  SpO2 97%  Physical Exam  Constitutional: He is oriented to person, place, and time. He appears well-developed.  HENT:  Head: Normocephalic.  Eyes: Conjunctivae and EOM are normal. No scleral icterus.  Neck: Neck supple. No thyromegaly present.  Cardiovascular: Normal rate and regular rhythm.  Exam reveals no gallop and no friction rub.   No murmur heard. Pulmonary/Chest: No stridor. He has no wheezes. He has no rales. He exhibits no tenderness.  Abdominal: He exhibits no distension. There is no tenderness. There is no rebound.  Genitourinary:  Greatly swollen scrotum with minimal tenderness  Musculoskeletal: Normal range of motion. He exhibits no edema.  Lymphadenopathy:    He has no cervical adenopathy.  Neurological: He is oriented to person, place, and time. Coordination normal.  Skin: No rash noted. No erythema.  Psychiatric: He has a normal mood and affect. His behavior is normal.    ED Course  Procedures (including critical care  time)  DIAGNOSTIC STUDIES: Oxygen Saturation is 97% on room air, normal by my interpretation.    COORDINATION OF CARE: 10:45 AM-Discussed treatment plan which includes [place] with pt at bedside and pt agreed to  plan.    Labs Review Labs Reviewed - No data to display  Imaging Review US Scrotum  08/05/2013   *RADIOLOGY REPORT*  Clinical Data:  Groin swelling  SCROTAL ULTRASOUND DOPPLER ULTRASOUND OF THE TESTICLES  Technique: Complete ultrasound examination of the testicles, epididymis, and other scrotal structures was performed.  Color and spectral Doppler ultrasound were also utilized to evaluate blood flow to the testicles.  Comparison:  None  Findings:  Right testis:  5.3 x 3.5 x 3.7 cm.  The testicle is diffusely heterogeneous without focal mass.  Normal arteriovenous flow is noted.  Left testis:  5.0 x 3.4 x 3.7 cm.  Normal arteriovenous flow is noted.  No focal mass lesion is seen.  Right epididymis:  Increased vascularity is identified.  Left epididymis:  Increased vascularity is identified.  Hydrocele:  Present bilaterally.  Mild echogenicity is noted within the left hydrocele consistent with a more complex appearance.  Varicocele:  Absent bilaterally  Pulsed Doppler interrogation of both testes demonstrates low resistance flow bilaterally. There is thickening of the scrotal scan identified with increased vascularity.  IMPRESSION: Diffuse scrotal edema.  Bilateral hydroceles with some complexity on the left.  Enlarged and increased vascularity within the epididymis bilaterally but worse on the left consistent with epididymitis.   Original Report Authenticated By: Alcide Clever, M.D.   [place] Art/ven Flow Abd Pelv Doppler  08/05/2013   *RADIOLOGY REPORT*  Clinical Data:  Groin swelling  SCROTAL ULTRASOUND DOPPLER ULTRASOUND OF THE TESTICLES  Technique: Complete ultrasound examination of the testicles, epididymis, and other scrotal structures was performed.  Color and spectral Doppler ultrasound were also  utilized to evaluate blood flow to the testicles.  Comparison:  None  Findings:  Right testis:  5.3 x 3.5 x 3.7 cm.  The testicle is diffusely heterogeneous without focal mass.  Normal arteriovenous flow is noted.  Left testis:  5.0 x 3.4 x 3.7 cm.  Normal arteriovenous flow is noted.  No focal mass lesion is seen.  Right epididymis:  Increased vascularity is identified.  Left epididymis:  Increased vascularity is identified.  Hydrocele:  Present bilaterally.  Mild echogenicity is noted within the left hydrocele consistent with a more complex appearance.  Varicocele:  Absent bilaterally  Pulsed Doppler interrogation of both testes demonstrates low resistance flow bilaterally. There is thickening of the scrotal scan identified with increased vascularity.  IMPRESSION: Diffuse scrotal edema.  Bilateral hydroceles with some complexity on the left.  Enlarged and increased vascularity within the epididymis bilaterally but worse on the left consistent with epididymitis.   Original Report Authenticated By: Alcide Clever, M.D.    MDM  No diagnosis found.     The chart was scribed for me under my direct supervision.  I personally performed the history, physical, and medical decision making and all procedures in the evaluation of this patient.Garrett Lennert, MD 08/05/13 [phone]

## 2013-08-05 NOTE — ED Notes (Signed)
CN unable to get an IV access, EDP made aware and verbal orders to change to PO

## 2013-08-05 NOTE — ED Notes (Signed)
Dr Zammit at bedside,  

## 2013-08-06 ENCOUNTER — Observation Stay (HOSPITAL_COMMUNITY)
Admission: EM | Admit: 2013-08-06 | Discharge: 2013-08-07 | Disposition: A | Discharge status: Home / Self Care | Payer: Medicare Other | Attending: Internal Medicine | Admitting: Internal Medicine

## 2013-08-06 ENCOUNTER — Encounter (HOSPITAL_COMMUNITY): Payer: Self-pay | Admitting: *Deleted

## 2013-08-06 DIAGNOSIS — E039 Hypothyroidism, unspecified: Secondary | ICD-10-CM | POA: Insufficient documentation

## 2013-08-06 DIAGNOSIS — N451 Epididymitis: Secondary | ICD-10-CM | POA: Diagnosis present

## 2013-08-06 DIAGNOSIS — E860 Dehydration: Secondary | ICD-10-CM | POA: Insufficient documentation

## 2013-08-06 DIAGNOSIS — Z79899 Other long term (current) drug therapy: Secondary | ICD-10-CM | POA: Insufficient documentation

## 2013-08-06 DIAGNOSIS — N498 Inflammatory disorders of other specified male genital organs: Secondary | ICD-10-CM | POA: Insufficient documentation

## 2013-08-06 DIAGNOSIS — N183 Chronic kidney disease, stage 3 unspecified: Secondary | ICD-10-CM

## 2013-08-06 DIAGNOSIS — IMO0002 Reserved for concepts with insufficient information to code with codable children: Secondary | ICD-10-CM

## 2013-08-06 DIAGNOSIS — E1165 Type 2 diabetes mellitus with hyperglycemia: Secondary | ICD-10-CM

## 2013-08-06 DIAGNOSIS — N400 Enlarged prostate without lower urinary tract symptoms: Secondary | ICD-10-CM

## 2013-08-06 DIAGNOSIS — E876 Hypokalemia: Secondary | ICD-10-CM | POA: Diagnosis present

## 2013-08-06 DIAGNOSIS — R339 Retention of urine, unspecified: Secondary | ICD-10-CM | POA: Insufficient documentation

## 2013-08-06 DIAGNOSIS — D649 Anemia, unspecified: Secondary | ICD-10-CM

## 2013-08-06 DIAGNOSIS — Z794 Long term (current) use of insulin: Secondary | ICD-10-CM | POA: Insufficient documentation

## 2013-08-06 DIAGNOSIS — N492 Inflammatory disorders of scrotum: Secondary | ICD-10-CM | POA: Diagnosis present

## 2013-08-06 DIAGNOSIS — Z978 Presence of other specified devices: Secondary | ICD-10-CM

## 2013-08-06 DIAGNOSIS — F329 Major depressive disorder, single episode, unspecified: Secondary | ICD-10-CM | POA: Insufficient documentation

## 2013-08-06 DIAGNOSIS — N453 Epididymo-orchitis: Principal | ICD-10-CM | POA: Insufficient documentation

## 2013-08-06 DIAGNOSIS — F3289 Other specified depressive episodes: Secondary | ICD-10-CM | POA: Insufficient documentation

## 2013-08-06 DIAGNOSIS — I10 Essential (primary) hypertension: Secondary | ICD-10-CM | POA: Diagnosis present

## 2013-08-06 DIAGNOSIS — E119 Type 2 diabetes mellitus without complications: Secondary | ICD-10-CM | POA: Diagnosis present

## 2013-08-06 DIAGNOSIS — IMO0001 Reserved for inherently not codable concepts without codable children: Secondary | ICD-10-CM | POA: Insufficient documentation

## 2013-08-06 DIAGNOSIS — Z87440 Personal history of urinary (tract) infections: Secondary | ICD-10-CM | POA: Insufficient documentation

## 2013-08-06 DIAGNOSIS — I129 Hypertensive chronic kidney disease with stage 1 through stage 4 chronic kidney disease, or unspecified chronic kidney disease: Secondary | ICD-10-CM | POA: Insufficient documentation

## 2013-08-06 LAB — BASIC METABOLIC PANEL
BUN: 22 mg/dL (ref 6–23)
CO2: 30 mEq/L (ref 19–32)
Calcium: 9 mg/dL (ref 8.4–10.5)
Chloride: 99 mEq/L (ref 96–112)
Creatinine, Ser: 1.56 mg/dL — ABNORMAL HIGH (ref 0.50–1.35)
GFR calc Af Amer: 48 mL/min — ABNORMAL LOW (ref >90)
GFR calc non Af Amer: 41 mL/min — ABNORMAL LOW (ref >90)
Glucose, Bld: 116 mg/dL — ABNORMAL HIGH (ref 70–99)
Potassium: 3.1 mEq/L — ABNORMAL LOW (ref 3.5–5.1)
Sodium: 138 mEq/L (ref 135–145)

## 2013-08-06 LAB — CBC WITH DIFFERENTIAL/PLATELET
Basophils Absolute: 0 10*3/uL (ref 0.0–0.1)
Basophils Relative: 0 % (ref 0–1)
Eosinophils Absolute: 0.1 10*3/uL (ref 0.0–0.7)
Eosinophils Relative: 1 % (ref 0–5)
HCT: 33.6 % — ABNORMAL LOW (ref 39.0–52.0)
Hemoglobin: 11 g/dL — ABNORMAL LOW (ref 13.0–17.0)
Lymphocytes Relative: 10 % — ABNORMAL LOW (ref 12–46)
Lymphs Abs: 1.1 10*3/uL (ref 0.7–4.0)
MCH: 28.9 pg (ref 26.0–34.0)
MCHC: 32.7 g/dL (ref 30.0–36.0)
MCV: 88.2 fL (ref 78.0–100.0)
Monocytes Absolute: 1.1 10*3/uL — ABNORMAL HIGH (ref 0.1–1.0)
Monocytes Relative: 10 % (ref 3–12)
Neutro Abs: 9.2 10*3/uL — ABNORMAL HIGH (ref 1.7–7.7)
Neutrophils Relative %: 80 % — ABNORMAL HIGH (ref 43–77)
Platelets: 167 10*3/uL (ref 150–400)
RBC: 3.81 MIL/uL — ABNORMAL LOW (ref 4.22–5.81)
RDW: 15.1 % (ref 11.5–15.5)
WBC: 11.6 10*3/uL — ABNORMAL HIGH (ref 4.0–10.5)

## 2013-08-06 LAB — URINALYSIS, ROUTINE W REFLEX MICROSCOPIC
Ketones, ur: NEGATIVE mg/dL
Nitrite: NEGATIVE
Urobilinogen, UA: 0.2 mg/dL (ref 0.0–1.0)

## 2013-08-06 LAB — GLUCOSE, CAPILLARY
Glucose-Capillary: 124 mg/dL — ABNORMAL HIGH (ref 70–99)
Glucose-Capillary: 135 mg/dL — ABNORMAL HIGH (ref 70–99)

## 2013-08-06 LAB — URINE MICROSCOPIC-ADD ON

## 2013-08-06 MED ORDER — POTASSIUM CHLORIDE CRYS ER 20 MEQ PO TBCR
40.0000 meq | EXTENDED_RELEASE_TABLET | Freq: Once | ORAL | Status: AC
  Administered 2013-08-06: 40 meq via ORAL
  Filled 2013-08-06: qty 2

## 2013-08-06 MED ORDER — POTASSIUM CHLORIDE IN NACL 40-0.9 MEQ/L-% IV SOLN
INTRAVENOUS | Status: AC
  Administered 2013-08-06: 16:00:00 via INTRAVENOUS

## 2013-08-06 MED ORDER — DEXTROSE 5 % IV SOLN
1.0000 g | INTRAVENOUS | Status: DC
  Administered 2013-08-07: 1 g via INTRAVENOUS
  Filled 2013-08-06 (×2): qty 10

## 2013-08-06 MED ORDER — VANCOMYCIN HCL IN DEXTROSE 1-5 GM/200ML-% IV SOLN
1000.0000 mg | Freq: Once | INTRAVENOUS | Status: AC
  Administered 2013-08-06: 1000 mg via INTRAVENOUS
  Filled 2013-08-06: qty 200

## 2013-08-06 MED ORDER — INSULIN ASPART 100 UNIT/ML ~~LOC~~ SOLN
0.0000 [IU] | Freq: Three times a day (TID) | SUBCUTANEOUS | Status: DC
  Administered 2013-08-06 – 2013-08-07 (×2): 1 [IU] via SUBCUTANEOUS

## 2013-08-06 MED ORDER — LIDOCAINE HCL (PF) 1 % IJ SOLN
INTRAMUSCULAR | Status: AC
  Administered 2013-08-06: 2.1 mL via INTRAMUSCULAR
  Filled 2013-08-06: qty 5

## 2013-08-06 MED ORDER — TERAZOSIN HCL 5 MG PO CAPS: 10.0000 mg | ORAL_CAPSULE | Freq: Every day | ORAL | Status: DC

## 2013-08-06 MED ORDER — ENOXAPARIN SODIUM 40 MG/0.4ML ~~LOC~~ SOLN
40.0000 mg | SUBCUTANEOUS | Status: DC
  Administered 2013-08-06: 40 mg via SUBCUTANEOUS
  Filled 2013-08-06: qty 0.4

## 2013-08-06 MED ORDER — LATANOPROST 0.005 % OP SOLN
1.0000 [drp] | Freq: Every day | OPHTHALMIC | Status: DC
  Administered 2013-08-06: 1 [drp] via OPHTHALMIC
  Filled 2013-08-06: qty 2.5

## 2013-08-06 MED ORDER — ONDANSETRON HCL 4 MG/2ML IJ SOLN: 4.0000 mg | Freq: Four times a day (QID) | INTRAMUSCULAR | Status: DC | PRN

## 2013-08-06 MED ORDER — INSULIN ASPART 100 UNIT/ML ~~LOC~~ SOLN: 0.0000 [IU] | Freq: Every day | SUBCUTANEOUS | Status: DC

## 2013-08-06 MED ORDER — ACETAMINOPHEN 325 MG PO TABS: 650.0000 mg | ORAL_TABLET | Freq: Four times a day (QID) | ORAL | Status: DC | PRN

## 2013-08-06 MED ORDER — LATANOPROST 0.005 % OP SOLN
OPHTHALMIC | Status: AC
  Filled 2013-08-06: qty 2.5

## 2013-08-06 MED ORDER — ALPRAZOLAM 0.5 MG PO TABS
1.0000 mg | ORAL_TABLET | Freq: Every evening | ORAL | Status: DC | PRN
  Administered 2013-08-06: 1 mg via ORAL
  Filled 2013-08-06: qty 2

## 2013-08-06 MED ORDER — TAMSULOSIN HCL 0.4 MG PO CAPS
0.4000 mg | ORAL_CAPSULE | Freq: Every day | ORAL | Status: DC
  Administered 2013-08-07: 0.4 mg via ORAL
  Filled 2013-08-06: qty 1

## 2013-08-06 MED ORDER — LEVOTHYROXINE SODIUM 100 MCG PO TABS
100.0000 ug | ORAL_TABLET | Freq: Every day | ORAL | Status: DC
  Administered 2013-08-07: 100 ug via ORAL
  Filled 2013-08-06: qty 1

## 2013-08-06 MED ORDER — CEFTRIAXONE SODIUM 1 G IJ SOLR
1.0000 g | Freq: Once | INTRAMUSCULAR | Status: AC
  Administered 2013-08-06: 1 g via INTRAMUSCULAR
  Filled 2013-08-06: qty 10

## 2013-08-06 MED ORDER — DOXYCYCLINE HYCLATE 100 MG PO TABS
100.0000 mg | ORAL_TABLET | Freq: Two times a day (BID) | ORAL | Status: DC
  Administered 2013-08-06 – 2013-08-07 (×2): 100 mg via ORAL
  Filled 2013-08-06 (×2): qty 1

## 2013-08-06 MED ORDER — INSULIN DETEMIR 100 UNIT/ML ~~LOC~~ SOLN
70.0000 [IU] | Freq: Every day | SUBCUTANEOUS | Status: DC
  Administered 2013-08-06: 70 [IU] via SUBCUTANEOUS
  Filled 2013-08-06 (×2): qty 0.7

## 2013-08-06 MED ORDER — HYDROCODONE-ACETAMINOPHEN 5-325 MG PO TABS: 1.0000 | ORAL_TABLET | ORAL | Status: DC | PRN

## 2013-08-06 MED ORDER — INSULIN ASPART 100 UNIT/ML ~~LOC~~ SOLN
10.0000 [IU] | Freq: Three times a day (TID) | SUBCUTANEOUS | Status: DC
  Administered 2013-08-06 – 2013-08-07 (×2): 10 [IU] via SUBCUTANEOUS

## 2013-08-06 MED ORDER — ONDANSETRON HCL 4 MG PO TABS: 4.0000 mg | ORAL_TABLET | Freq: Four times a day (QID) | ORAL | Status: DC | PRN

## 2013-08-06 MED ORDER — ACETAMINOPHEN 650 MG RE SUPP: 650.0000 mg | Freq: Four times a day (QID) | RECTAL | Status: DC | PRN

## 2013-08-06 MED ORDER — INSULIN DETEMIR 100 UNIT/ML ~~LOC~~ SOLN
SUBCUTANEOUS | Status: AC
  Filled 2013-08-06: qty 1

## 2013-08-06 MED ORDER — DILTIAZEM HCL ER COATED BEADS 300 MG PO CP24
300.0000 mg | ORAL_CAPSULE | Freq: Every day | ORAL | Status: DC
  Administered 2013-08-07: 300 mg via ORAL
  Filled 2013-08-06 (×4): qty 1

## 2013-08-06 MED ORDER — FERROUS SULFATE 325 (65 FE) MG PO TABS
325.0000 mg | ORAL_TABLET | Freq: Every day | ORAL | Status: DC
  Administered 2013-08-07: 325 mg via ORAL
  Filled 2013-08-06: qty 1

## 2013-08-06 NOTE — ED Notes (Signed)
Pt was seen in er yesterday for swelling to scrotal area, returns today for recheck, states that he is not sure if he is any better or not.

## 2013-08-06 NOTE — H&P (Signed)
[facility] History and Physical  Garrett Bradley ZOX:[id number] DOB: 1934-05-12 DOA: 08/06/2013  Referring physician: Dr. Estell Harpin, ER physician PCP: Colette Ribas, MD  Specialists: Urologist: Dr. Jerre Simon  Chief Complaint: Scrotal swelling  HPI: Garrett Bradley is a 79 y.o. male who presents to the emergency room with complaints of scrotal edema and tenderness. He reports the onset of symptoms occurring approximately a week ago. It progressively gotten worse. He has not noted any high fevers. The patient has a chronic Foley catheter was placed for his urologist. He has had problems with benign prostatic hypertrophy. His current catheter has been in place for over 2 weeks. He has not noticed any blood or pus in the urine. His bowel movements have been normal. He's not had any shortness of breath or chest pain. He has a chronic cough. Denies any unilateral weakness or numbness. No changes in vision. No nausea or vomiting. He was evaluated in the emergency room yesterday and was given a dose of IV antibiotics. He returned to the emergency room today for recheck and it was noted that his symptoms have persisted. He developed an mild leukocytosis at one point was noted to be tachycardic. Patient has been referred for admission  Review of Systems: Pertinent positives as per history of present illness, otherwise negative  Past Medical History  Diagnosis Date  . Hypertension   . Diabetes mellitus   . Hypothyroidism   . Depression   . Urinary problem   . Hypokalemia   . Enlarged prostate    Past Surgical History  Procedure Laterality Date  . Hernia repair    . Transurethral resection of prostate    . Transurethral resection of prostate  09/22/2011    Procedure: TRANSURETHRAL RESECTION OF THE PROSTATE (TURP);  Surgeon: Ky Barban;  Location: [facility];  Service: Urology;  Laterality: N/A;   Social History:  reports that he has never smoked. He has never used smokeless tobacco. He  reports that he does not drink alcohol or use illicit drugs.   Allergies  Allergen Reactions  . Thiazide-Type Diuretics     Patient was just told not to take, not sure if an actual allergy    Family History  Problem Relation Age of Onset  . Hypertension Mother   . Hypertension Brother   . Cerebral aneurysm Mother     Prior to Admission medications   Medication Sig Start Date End Date Taking? Authorizing Provider  ALPRAZolam Prudy Feeler) 1 MG tablet Take 1 mg by mouth at bedtime as needed for sleep.  10/02/11  Yes Christiane Ha, MD  ciprofloxacin (CIPRO) 500 MG tablet Take 1 tablet (500 mg total) by mouth 2 (two) times daily. One po bid x 7 days 08/05/13  Yes Benny Lennert, MD  diltiazem (CARDIZEM CD) 300 MG 24 hr capsule Take 300 mg by mouth daily.    Yes Historical Provider, MD  ferrous sulfate 325 (65 FE) MG tablet Take 325 mg by mouth daily.  10/17/11  Yes Vassie Loll, MD  fish oil-omega-3 fatty acids 1000 MG capsule Take 1 capsule (1 g total) by mouth 2 (two) times daily. 10/02/11  Yes Christiane Ha, MD  insulin detemir (LEVEMIR FLEXPEN) 100 UNIT/ML injection Inject 70 Units into the skin at bedtime.    Yes Historical Provider, MD  insulin lispro (HUMALOG KWIKPEN) 100 UNIT/ML injection Inject 20 Units into the skin 3 (three) times daily before meals.    Yes Historical Provider, MD  levothyroxine (SYNTHROID,  LEVOTHROID) 100 MCG tablet Take 100 mcg by mouth daily.    Yes Historical Provider, MD  niacin 500 MG tablet Take 500 mg by mouth at bedtime.   Yes Historical Provider, MD  potassium chloride SA (K-DUR,KLOR-CON) 20 MEQ tablet Take 20 mEq by mouth every morning. 02/15/13  Yes Lesle Chris Black, NP  tamsulosin (FLOMAX) 0.4 MG CAPS Take 0.4 mg by mouth daily.   Yes Historical Provider, MD  terazosin (HYTRIN) 10 MG capsule Take 1 capsule (10 mg total) by mouth at bedtime. 10/23/11  Yes Hind I Elsaid, MD  Travoprost, BAK Free, (TRAVATAN) 0.004 % SOLN ophthalmic solution Place 1  drop into both eyes at bedtime.   Yes Historical Provider, MD   Physical Exam: Filed Vitals:   08/06/13 1344  BP: 119/67  Pulse: 112  Temp:   Resp:      General:  No acute distress  Eyes: Pupils are equal round reactive to light  ENT: Mucous membranes are moist  Neck: Supple  Cardiovascular: S1, S2, regular rate and rhythm  Respiratory: Clear to auscultation bilaterally  Abdomen: Soft, nontender, obese, positive bowel sounds  Skin: Skin of the scrotum is mildly erythematous, no real calor appreciated  Genitourinary. Scrotum is enlarged. Mild tenderness on palpation. Foley catheter in place  Musculoskeletal: Trace pedal edema bilaterally  Psychiatric: Normal affect, cooperative with exam  Neurologic: Grossly intact, nonfocal  Labs on Admission:  Basic Metabolic Panel:  Recent Labs Lab 08/05/13 1329 08/06/13 1117  NA 139 138  K 3.2* 3.1*  CL 99 99  CO2 31 30  GLUCOSE 85 116*  BUN 18 22  CREATININE 1.36* 1.56*  CALCIUM 9.2 9.0   Liver Function Tests: No results found for this basename: AST, ALT, ALKPHOS, BILITOT, PROT, ALBUMIN,  in the last 168 hours No results found for this basename: LIPASE, AMYLASE,  in the last 168 hours No results found for this basename: AMMONIA,  in the last 168 hours CBC:  Recent Labs Lab 08/05/13 1329 08/06/13 1117  WBC 10.3 11.6*  NEUTROABS 7.6 9.2*  HGB 11.8* 11.0*  HCT 35.6* 33.6*  MCV 88.1 88.2  PLT 164 167   Cardiac Enzymes: No results found for this basename: CKTOTAL, CKMB, CKMBINDEX, TROPONINI,  in the last 168 hours  BNP (last 3 results)  Recent Labs  02/12/13 2015  PROBNP 141.0   CBG: No results found for this basename: GLUCAP,  in the last 168 hours  Radiological Exams on Admission: US Scrotum  08/05/2013   *RADIOLOGY REPORT*  Clinical Data:  Groin swelling  SCROTAL ULTRASOUND DOPPLER ULTRASOUND OF THE TESTICLES  Technique: Complete ultrasound examination of the testicles, epididymis, and other  scrotal structures was performed.  Color and spectral Doppler ultrasound were also utilized to evaluate blood flow to the testicles.  Comparison:  None  Findings:  Right testis:  5.3 x 3.5 x 3.7 cm.  The testicle is diffusely heterogeneous without focal mass.  Normal arteriovenous flow is noted.  Left testis:  5.0 x 3.4 x 3.7 cm.  Normal arteriovenous flow is noted.  No focal mass lesion is seen.  Right epididymis:  Increased vascularity is identified.  Left epididymis:  Increased vascularity is identified.  Hydrocele:  Present bilaterally.  Mild echogenicity is noted within the left hydrocele consistent with a more complex appearance.  Varicocele:  Absent bilaterally  Pulsed Doppler interrogation of both testes demonstrates low resistance flow bilaterally. There is thickening of the scrotal scan identified with increased vascularity.  IMPRESSION: Diffuse scrotal  edema.  Bilateral hydroceles with some complexity on the left.  Enlarged and increased vascularity within the epididymis bilaterally but worse on the left consistent with epididymitis.   Original Report Authenticated By: Alcide Clever, M.D.   [place] Art/ven Flow Abd Pelv Doppler  08/05/2013   *RADIOLOGY REPORT*  Clinical Data:  Groin swelling  SCROTAL ULTRASOUND DOPPLER ULTRASOUND OF THE TESTICLES  Technique: Complete ultrasound examination of the testicles, epididymis, and other scrotal structures was performed.  Color and spectral Doppler ultrasound were also utilized to evaluate blood flow to the testicles.  Comparison:  None  Findings:  Right testis:  5.3 x 3.5 x 3.7 cm.  The testicle is diffusely heterogeneous without focal mass.  Normal arteriovenous flow is noted.  Left testis:  5.0 x 3.4 x 3.7 cm.  Normal arteriovenous flow is noted.  No focal mass lesion is seen.  Right epididymis:  Increased vascularity is identified.  Left epididymis:  Increased vascularity is identified.  Hydrocele:  Present bilaterally.  Mild echogenicity is noted within the left  hydrocele consistent with a more complex appearance.  Varicocele:  Absent bilaterally  Pulsed Doppler interrogation of both testes demonstrates low resistance flow bilaterally. There is thickening of the scrotal scan identified with increased vascularity.  IMPRESSION: Diffuse scrotal edema.  Bilateral hydroceles with some complexity on the left.  Enlarged and increased vascularity within the epididymis bilaterally but worse on the left consistent with epididymitis.   Original Report Authenticated By: Alcide Clever, M.D.    Assessment/Plan Active Problems:   Hypothyroidism   Anemia   BPH (benign prostatic hyperplasia)   Hypertension   Diabetes type 2, uncontrolled   Hypokalemia   Cellulitis, scrotum   Epididymitis   CKD (chronic kidney disease) stage 3, GFR 30-59 ml/min   Chronic indwelling Foley catheter   1. Epididymitis and scrotal cellulitis. We'll admit the patient for observation. Continue antibiotic coverage with Rocephin and doxycycline. Send urinalysis and urine culture. Since patient is known to Dr. Jerre Simon, we will request a urology consultation for further recommendations. 2. Chronic kidney disease stage III. Creatinine appears to be near baseline. 3. Chronic indwelling Foley catheter, likely due to bladder with obstruction. Continue catheter. 4. Diabetes. Continue outpatient regimen and supplement with sliding scale insulin. 5. Hypertension. Stable. 6. Anemia, likely of chronic disease due to renal disease. Hemoglobin is near baseline. 7. Hypothyroidism. Continue Synthroid. 8. Hypokalemia. Replace.  Code Status: full code Family Communication: discussed with patient Disposition Plan: probable discharge home in the next 24-48hrs  Time spent:  Millette Halberstam [facility] Pager [phone]  If 7PM-7AM, please contact night-coverage [email or web address] Password [facility] 08/06/2013, 3:57 PM

## 2013-08-06 NOTE — ED Provider Notes (Signed)
CSN: [id number]     Arrival date & time 08/06/13  1034 History  This chart was scribed for Garrett Lennert, MD by Caryn Bee, ED Scribe and Greggory Stallion, ED Scribe. This patient was seen in room APA12/APA12 and the patient's care was started at 10:46 AM.    Chief Complaint  Patient presents with  . Follow-up    Patient is a 79 y.o. male presenting with wound check. The history is provided by the patient. No language interpreter was used.  Wound Check This is a new problem. The current episode started 2 days ago. The problem occurs constantly. The problem has not changed since onset.Pertinent negatives include no chest pain, no abdominal pain and no headaches. Exacerbated by: palpation. Nothing relieves the symptoms. Treatments tried: antibiotics. The treatment provided no relief.   HPI Comments: Garrett Bradley is a 79 y.o. male who presents to the Emergency Department for recheck of scrotal swelling. Pt was seen yesterday and the ED by Dr. Estell Harpin for scrotal swelling and pain that started 2 days ago and was told to return for recheck. Pt states he has been taking his prescribed oral antibiotics. He is still having pain and swelling. Pt denies fever and chills.   Past Medical History  Diagnosis Date  . Hypertension   . Diabetes mellitus   . Hypothyroidism   . Depression   . Urinary problem   . Hypokalemia   . Enlarged prostate    Past Surgical History  Procedure Laterality Date  . Hernia repair    . Transurethral resection of prostate    . Transurethral resection of prostate  09/22/2011    Procedure: TRANSURETHRAL RESECTION OF THE PROSTATE (TURP);  Surgeon: Ky Barban;  Location: [facility];  Service: Urology;  Laterality: N/A;   Family History  Problem Relation Age of Onset  . Hypertension Mother   . Hypertension Brother   . Cerebral aneurysm Mother    History  Substance Use Topics  . Smoking status: Never Smoker   . Smokeless tobacco: Never Used  . Alcohol Use:  No    Review of Systems  Constitutional: Negative for appetite change and fatigue.  HENT: Negative for congestion, sinus pressure and ear discharge.   Eyes: Negative for discharge.  Respiratory: Negative for cough.   Cardiovascular: Negative for chest pain.  Gastrointestinal: Negative for abdominal pain and diarrhea.  Genitourinary: Positive for scrotal swelling and testicular pain. Negative for frequency and hematuria.  Musculoskeletal: Negative for back pain.  Skin: Negative for rash.  Neurological: Negative for seizures and headaches.  Psychiatric/Behavioral: Negative for hallucinations.    Allergies  Thiazide-type diuretics  Home Medications   Current Outpatient Rx  Name  Route  Sig  Dispense  Refill  . ALPRAZolam (XANAX) 1 MG tablet   Oral   Take 1 mg by mouth at bedtime as needed for sleep.          . ciprofloxacin (CIPRO) 500 MG tablet   Oral   Take 1 tablet (500 mg total) by mouth 2 (two) times daily. One po bid x 7 days   20 tablet   0   . diltiazem (CARDIZEM CD) 300 MG 24 hr capsule   Oral   Take 300 mg by mouth daily.          . ferrous sulfate 325 (65 FE) MG tablet   Oral   Take 325 mg by mouth daily.          . fish oil-omega-3  fatty acids 1000 MG capsule   Oral   Take 1 capsule (1 g total) by mouth 2 (two) times daily.         . insulin detemir (LEVEMIR FLEXPEN) 100 UNIT/ML injection   Subcutaneous   Inject 70 Units into the skin at bedtime.          . insulin lispro (HUMALOG KWIKPEN) 100 UNIT/ML injection   Subcutaneous   Inject 20 Units into the skin 3 (three) times daily before meals.          Marland Kitchen levothyroxine (SYNTHROID, LEVOTHROID) 100 MCG tablet   Oral   Take 100 mcg by mouth daily.          Marland Kitchen linagliptin (TRADJENTA) 5 MG TABS tablet   Oral   Take 5 mg by mouth daily.          . niacin 500 MG tablet   Oral   Take 500 mg by mouth at bedtime.         . potassium chloride SA (K-DUR,KLOR-CON) 20 MEQ tablet   Oral    Take 20 mEq by mouth every morning.         . tamsulosin (FLOMAX) 0.4 MG CAPS   Oral   Take 0.4 mg by mouth daily.         Marland Kitchen terazosin (HYTRIN) 10 MG capsule   Oral   Take 1 capsule (10 mg total) by mouth at bedtime.   30 capsule   0   . Travoprost, BAK Free, (TRAVATAN) 0.004 % SOLN ophthalmic solution   Both Eyes   Place 1 drop into both eyes at bedtime.          BP 145/66  Pulse 85  Temp(Src) 98.7 F (37.1 C) (Oral)  Resp 16  SpO2 95% Physical Exam  Nursing note and vitals reviewed. Constitutional: He is oriented to person, place, and time. He appears well-developed.  HENT:  Head: Normocephalic.  Eyes: Conjunctivae are normal.  Neck: No tracheal deviation present.  Cardiovascular:  No murmur heard. Genitourinary:  Swollen scrotum, inflamed and tender. Same as yesterday.  Musculoskeletal: Normal range of motion.  Neurological: He is oriented to person, place, and time.  Skin: Skin is warm.  Psychiatric: He has a normal mood and affect.    ED Course  Procedures (including critical care time) DIAGNOSTIC STUDIES: Oxygen Saturation is 95% on room air, adequate by my interpretation.    COORDINATION OF CARE: 10:55 AM-Discussed treatment plan which includes blood tests and antibiotic shot with pt at bedside and pt agreed to plan.     Labs Review Labs Reviewed  CBC WITH DIFFERENTIAL - Abnormal; Notable for the following:    WBC 11.6 (*)    RBC 3.81 (*)    Hemoglobin 11.0 (*)    HCT 33.6 (*)    Neutrophils Relative % 80 (*)    Neutro Abs 9.2 (*)    Lymphocytes Relative 10 (*)    Monocytes Absolute 1.1 (*)    All other components within normal limits  BASIC METABOLIC PANEL - Abnormal; Notable for the following:    Potassium 3.1 (*)    Glucose, Bld 116 (*)    Creatinine, Ser 1.56 (*)    GFR calc non Af Amer 41 (*)    GFR calc Af Amer 48 (*)    All other components within normal limits   Imaging Review US Scrotum  08/05/2013   *RADIOLOGY REPORT*   Clinical Data:  Groin swelling  SCROTAL ULTRASOUND  DOPPLER ULTRASOUND OF THE TESTICLES  Technique: Complete ultrasound examination of the testicles, epididymis, and other scrotal structures was performed.  Color and spectral Doppler ultrasound were also utilized to evaluate blood flow to the testicles.  Comparison:  None  Findings:  Right testis:  5.3 x 3.5 x 3.7 cm.  The testicle is diffusely heterogeneous without focal mass.  Normal arteriovenous flow is noted.  Left testis:  5.0 x 3.4 x 3.7 cm.  Normal arteriovenous flow is noted.  No focal mass lesion is seen.  Right epididymis:  Increased vascularity is identified.  Left epididymis:  Increased vascularity is identified.  Hydrocele:  Present bilaterally.  Mild echogenicity is noted within the left hydrocele consistent with a more complex appearance.  Varicocele:  Absent bilaterally  Pulsed Doppler interrogation of both testes demonstrates low resistance flow bilaterally. There is thickening of the scrotal scan identified with increased vascularity.  IMPRESSION: Diffuse scrotal edema.  Bilateral hydroceles with some complexity on the left.  Enlarged and increased vascularity within the epididymis bilaterally but worse on the left consistent with epididymitis.   Original Report Authenticated By: Alcide Clever, M.D.   [place] Art/ven Flow Abd Pelv Doppler  08/05/2013   *RADIOLOGY REPORT*  Clinical Data:  Groin swelling  SCROTAL ULTRASOUND DOPPLER ULTRASOUND OF THE TESTICLES  Technique: Complete ultrasound examination of the testicles, epididymis, and other scrotal structures was performed.  Color and spectral Doppler ultrasound were also utilized to evaluate blood flow to the testicles.  Comparison:  None  Findings:  Right testis:  5.3 x 3.5 x 3.7 cm.  The testicle is diffusely heterogeneous without focal mass.  Normal arteriovenous flow is noted.  Left testis:  5.0 x 3.4 x 3.7 cm.  Normal arteriovenous flow is noted.  No focal mass lesion is seen.  Right epididymis:   Increased vascularity is identified.  Left epididymis:  Increased vascularity is identified.  Hydrocele:  Present bilaterally.  Mild echogenicity is noted within the left hydrocele consistent with a more complex appearance.  Varicocele:  Absent bilaterally  Pulsed Doppler interrogation of both testes demonstrates low resistance flow bilaterally. There is thickening of the scrotal scan identified with increased vascularity.  IMPRESSION: Diffuse scrotal edema.  Bilateral hydroceles with some complexity on the left.  Enlarged and increased vascularity within the epididymis bilaterally but worse on the left consistent with epididymitis.   Original Report Authenticated By: Alcide Clever, M.D.    MDM  No diagnosis found.    The chart was scribed for me under my direct supervision.  I personally performed the history, physical, and medical decision making and all procedures in the evaluation of this patient.Garrett Lennert, MD 08/06/13 [id number]

## 2013-08-07 DIAGNOSIS — N400 Enlarged prostate without lower urinary tract symptoms: Secondary | ICD-10-CM

## 2013-08-07 LAB — CBC
Hemoglobin: 11.2 g/dL — ABNORMAL LOW (ref 13.0–17.0)
MCH: 29.2 pg (ref 26.0–34.0)
MCV: 88.5 fL (ref 78.0–100.0)
RBC: 3.83 MIL/uL — ABNORMAL LOW (ref 4.22–5.81)
WBC: 8.9 10*3/uL (ref 4.0–10.5)

## 2013-08-07 LAB — BASIC METABOLIC PANEL
CO2: 29 mEq/L (ref 19–32)
Glucose, Bld: 142 mg/dL — ABNORMAL HIGH (ref 70–99)
Potassium: 3.6 mEq/L (ref 3.5–5.1)
Sodium: 139 mEq/L (ref 135–145)

## 2013-08-07 LAB — GLUCOSE, CAPILLARY: Glucose-Capillary: 88 mg/dL (ref 70–99)

## 2013-08-07 MED ORDER — HYDROCODONE-ACETAMINOPHEN 5-325 MG PO TABS: 1.0000 | ORAL_TABLET | Freq: Three times a day (TID) | ORAL | Status: DC | PRN

## 2013-08-07 NOTE — Consult Note (Signed)
Note [phone]

## 2013-08-07 NOTE — Progress Notes (Signed)
UR Chart Review Completed  

## 2013-08-07 NOTE — Progress Notes (Signed)
Pt discharged with instructions, prescriptions, and care notes.  They verbalized understanding.  The patient left the floor via w/c in stable condition.  The patients drainage bag was changed back to the leg bag prior to leaving the unit.  They were instructed how to empty the bag.  They demonstrated understanding by teach back.

## 2013-08-07 NOTE — Progress Notes (Signed)
Nutrition Brief Note  Patient identified on the Malnutrition Screening Tool (MST) Report  Wt Readings from Last 15 Encounters:  08/06/13 308 lb 10.3 oz (140 kg)  08/05/13 312 lb (141.522 kg)  06/19/13 310 lb (140.615 kg)  06/10/13 310 lb (140.615 kg)  06/01/13 300 lb (136.079 kg)  04/12/13 330 lb (149.687 kg)  04/09/13 330 lb (149.687 kg)  03/29/13 330 lb (149.687 kg)  02/14/13 345 lb 0.3 oz (156.5 kg)  10/23/11 266 lb 12.1 oz (121 kg)  10/23/11 266 lb 12.1 oz (121 kg)  10/23/11 266 lb 12.1 oz (121 kg)  10/16/11 278 lb 10.6 oz (126.4 kg)  10/13/11 285 lb (129.275 kg)  09/29/11 285 lb (129.275 kg)    Body mass index is 41.85 kg/(m^2). Patient meets criteria for extreme obesity Class III based on current BMI.   Current diet order is CHO MOD (1600-2000 kcal), patient is consuming approximately 100% of meals at this time. Labs and medications reviewed.   No nutrition interventions warranted at this time. If nutrition issues arise, please consult RD.   Royann Shivers MS,RD,LDN,CSG Office: [phone] Pager: [phone]

## 2013-08-07 NOTE — Discharge Summary (Signed)
Physician Discharge Summary  Garrett Bradley ZOX:[id number] DOB: 1934-02-07 DOA: 08/06/2013  PCP: Colette Ribas, MD  Admit date: 08/06/2013 Discharge date: 08/07/2013  Time spent: 35 minutes  Recommendations for Outpatient Follow-up:  1. Follow up with Dr. Jerre Simon in 1 week  Discharge Diagnoses:  Active Problems:   Hypothyroidism   Anemia   BPH (benign prostatic hyperplasia)   Hypertension   Diabetes type 2, uncontrolled   Hypokalemia   Cellulitis, scrotum   Epididymitis   CKD (chronic kidney disease) stage 3, GFR 30-59 ml/min   Chronic indwelling Foley catheter   Discharge Condition: improved  Diet recommendation: low salt, low carb  Filed Weights   08/06/13 1601  Weight: 140 kg (308 lb 10.3 oz)    History of present illness:  Garrett Bradley is a 79 y.o. male who presents to the emergency room with complaints of scrotal edema and tenderness. He reports the onset of symptoms occurring approximately a week ago. It progressively gotten worse. He has not noted any high fevers. The patient has a chronic Foley catheter was placed for his urologist. He has had problems with benign prostatic hypertrophy. His current catheter has been in place for over 2 weeks. He has not noticed any blood or pus in the urine. His bowel movements have been normal. He's not had any shortness of breath or chest pain. He has a chronic cough. Denies any unilateral weakness or numbness. No changes in vision. No nausea or vomiting. He was evaluated in the emergency room yesterday and was given a dose of IV antibiotics. He returned to the emergency room today for recheck and it was noted that his symptoms have persisted. He developed an mild leukocytosis at one point was noted to be tachycardic. Patient has been referred for admission   Hospital Course:  This patient was admitted to the hospital for scrotal cellulitis/epididymitis. He was given IV Rocephin as well as oral doxycycline. His leukocytosis  resolved, he was no longer febrile. Dehydration was improved with IV fluids. He was seen by neurology who recommended outpatient followup. He's been transitioned to by mouth antibiotics. It is recommended for scrotal edema that he keep his scrotum elevated under towels. Warm compresses could also be used to help with erythema and pain. It is anticipated that this scrotal edema will take several weeks to resolve. The patient is felt safe to discharge home. The remainder of his medical issues are stable. Urine culture was sent and is currently pending at time of discharge.   Procedures:  none  Consultations:  Urology, Dr. Jerre Simon  Discharge Exam: Filed Vitals:   08/07/13 0514  BP: 147/63  Pulse: 72  Temp: 99 F (37.2 C)  Resp: 20    General: NAD Cardiovascular: S1, S2 RRR Respiratory: CTA B GU: scrotal edema with some erythema, mildly tender  Discharge Instructions  Discharge Orders   Future Orders Complete By Expires   Call MD for:  redness, tenderness, or signs of infection (pain, swelling, redness, odor or green/yellow discharge around incision site)  As directed    Call MD for:  severe uncontrolled pain  As directed    Call MD for:  temperature >100.4  As directed    Diet - low sodium heart healthy  As directed    Diet Carb Modified  As directed    Increase activity slowly  As directed        Medication List         ALPRAZolam 1 MG tablet  Commonly known as:  XANAX  Take 1 mg by mouth at bedtime as needed for sleep.     ciprofloxacin 500 MG tablet  Commonly known as:  CIPRO  Take 1 tablet (500 mg total) by mouth 2 (two) times daily. One po bid x 7 days     diltiazem 300 MG 24 hr capsule  Commonly known as:  CARDIZEM CD  Take 300 mg by mouth daily.     ferrous sulfate 325 (65 FE) MG tablet  Take 325 mg by mouth daily.     fish oil-omega-3 fatty acids 1000 MG capsule  Take 1 capsule (1 g total) by mouth 2 (two) times daily.     HUMALOG KWIKPEN 100 UNIT/ML  injection  Generic drug:  insulin lispro  Inject 20 Units into the skin 3 (three) times daily before meals.     HYDROcodone-acetaminophen 5-325 MG per tablet  Commonly known as:  NORCO/VICODIN  Take 1 tablet by mouth every 8 (eight) hours as needed.     LEVEMIR FLEXPEN 100 UNIT/ML injection  Generic drug:  insulin detemir  Inject 70 Units into the skin at bedtime.     levothyroxine 100 MCG tablet  Commonly known as:  SYNTHROID, LEVOTHROID  Take 100 mcg by mouth daily.     niacin 500 MG tablet  Take 500 mg by mouth at bedtime.     potassium chloride SA 20 MEQ tablet  Commonly known as:  K-DUR,KLOR-CON  Take 20 mEq by mouth every morning.     tamsulosin 0.4 MG Caps capsule  Commonly known as:  FLOMAX  Take 0.4 mg by mouth daily.     Travoprost (BAK Free) 0.004 % Soln ophthalmic solution  Commonly known as:  TRAVATAN  Place 1 drop into both eyes at bedtime.       Allergies  Allergen Reactions  . Thiazide-Type Diuretics     Patient was just told not to take, not sure if an actual allergy      The results of significant diagnostics from this hospitalization (including imaging, microbiology, ancillary and laboratory) are listed below for reference.    Significant Diagnostic Studies: US Scrotum  08/05/2013   *RADIOLOGY REPORT*  Clinical Data:  Groin swelling  SCROTAL ULTRASOUND DOPPLER ULTRASOUND OF THE TESTICLES  Technique: Complete ultrasound examination of the testicles, epididymis, and other scrotal structures was performed.  Color and spectral Doppler ultrasound were also utilized to evaluate blood flow to the testicles.  Comparison:  None  Findings:  Right testis:  5.3 x 3.5 x 3.7 cm.  The testicle is diffusely heterogeneous without focal mass.  Normal arteriovenous flow is noted.  Left testis:  5.0 x 3.4 x 3.7 cm.  Normal arteriovenous flow is noted.  No focal mass lesion is seen.  Right epididymis:  Increased vascularity is identified.  Left epididymis:  Increased  vascularity is identified.  Hydrocele:  Present bilaterally.  Mild echogenicity is noted within the left hydrocele consistent with a more complex appearance.  Varicocele:  Absent bilaterally  Pulsed Doppler interrogation of both testes demonstrates low resistance flow bilaterally. There is thickening of the scrotal scan identified with increased vascularity.  IMPRESSION: Diffuse scrotal edema.  Bilateral hydroceles with some complexity on the left.  Enlarged and increased vascularity within the epididymis bilaterally but worse on the left consistent with epididymitis.   Original Report Authenticated By: Alcide Clever, M.D.   [place] Art/ven Flow Abd Pelv Doppler  08/05/2013   *RADIOLOGY REPORT*  Clinical Data:  Groin swelling  SCROTAL ULTRASOUND DOPPLER ULTRASOUND OF THE TESTICLES  Technique: Complete ultrasound examination of the testicles, epididymis, and other scrotal structures was performed.  Color and spectral Doppler ultrasound were also utilized to evaluate blood flow to the testicles.  Comparison:  None  Findings:  Right testis:  5.3 x 3.5 x 3.7 cm.  The testicle is diffusely heterogeneous without focal mass.  Normal arteriovenous flow is noted.  Left testis:  5.0 x 3.4 x 3.7 cm.  Normal arteriovenous flow is noted.  No focal mass lesion is seen.  Right epididymis:  Increased vascularity is identified.  Left epididymis:  Increased vascularity is identified.  Hydrocele:  Present bilaterally.  Mild echogenicity is noted within the left hydrocele consistent with a more complex appearance.  Varicocele:  Absent bilaterally  Pulsed Doppler interrogation of both testes demonstrates low resistance flow bilaterally. There is thickening of the scrotal scan identified with increased vascularity.  IMPRESSION: Diffuse scrotal edema.  Bilateral hydroceles with some complexity on the left.  Enlarged and increased vascularity within the epididymis bilaterally but worse on the left consistent with epididymitis.   Original  Report Authenticated By: Alcide Clever, M.D.    Microbiology: No results found for this or any previous visit (from the past 240 hour(s)).   Labs: Basic Metabolic Panel:  Recent Labs Lab 08/05/13 1329 08/06/13 1117 08/07/13 0619  NA 139 138 139  K 3.2* 3.1* 3.6  CL 99 99 102  CO2 31 30 29   GLUCOSE 85 116* 142*  BUN 18 22 18   CREATININE 1.36* 1.56* 1.21  CALCIUM 9.2 9.0 8.7   Liver Function Tests: No results found for this basename: AST, ALT, ALKPHOS, BILITOT, PROT, ALBUMIN,  in the last 168 hours No results found for this basename: LIPASE, AMYLASE,  in the last 168 hours No results found for this basename: AMMONIA,  in the last 168 hours CBC:  Recent Labs Lab 08/05/13 1329 08/06/13 1117 08/07/13 0619  WBC 10.3 11.6* 8.9  NEUTROABS 7.6 9.2*  --   HGB 11.8* 11.0* 11.2*  HCT 35.6* 33.6* 33.9*  MCV 88.1 88.2 88.5  PLT 164 167 179   Cardiac Enzymes: No results found for this basename: CKTOTAL, CKMB, CKMBINDEX, TROPONINI,  in the last 168 hours BNP: BNP (last 3 results)  Recent Labs  02/12/13 2015  PROBNP 141.0   CBG:  Recent Labs Lab 08/06/13 1700 08/06/13 2111 08/07/13 0744 08/07/13 1156  GLUCAP 124* 135* 124* 88       Signed:  Lora Chavers  [facility] 08/07/2013, 1:17 PM

## 2013-08-07 NOTE — Care Management Note (Signed)
    Page 1 of 1   08/07/2013     2:58:23 PM   CARE MANAGEMENT NOTE 08/07/2013  Patient:  CUTBERTO, WINFREE   Account Number:  [id number]  Date Initiated:  08/07/2013  Documentation initiated by:  Anibal Henderson  Subjective/Objective Assessment:   Admitted with cellulitis of the scrotum. Pt is from home with spouse and will return home at D/C.     Action/Plan:   No CM needs identified   Anticipated DC Date:  08/07/2013   Anticipated DC Plan:  HOME/SELF CARE         Choice offered to / List presented to:             Status of service:  Completed, signed off Medicare Important Message given?   (If response is "NO", the following Medicare IM given date fields will be blank) Date Medicare IM given:   Date Additional Medicare IM given:    Discharge Disposition:  HOME/SELF CARE  Per UR Regulation:  Reviewed for med. necessity/level of care/duration of stay  If discussed at Long Length of Stay Meetings, dates discussed:    Comments:  08/07/13 1450 Anibal Henderson RN

## 2013-08-08 NOTE — Consult Note (Signed)
NAME:  JEREL, SARDINA               ACCOUNT NO.:  [id number]  MEDICAL RECORD NO.:  [id number]  LOCATION:  A340                          FACILITY:  [facility]  PHYSICIAN:  Ky Barban, M.D.DATE OF BIRTH:  20-Jul-1934  DATE OF CONSULTATION: DATE OF DISCHARGE:                                CONSULTATION   HISTORY:  Mr. Kidd is a 79 year old gentleman.  He is admitted with possible right epididymo-orchitis.  This patient has a longstanding history of urinary tract infections and urinary retention, and he had undergone in the past a couple of TUR prostate, but still he is in the urinary retention.  My feeling is that he has dyssynergia, and I have decided to treat him with in and out catheterization, but for the time being he has a Foley catheter.  He is going to come next week in the office.  We are going teach him how to do in and out cath.  In the meantime, he developed swelling and pain of his right testicle, came to the emergency room.  There was marked edema and swelling and tenderness of the right hemiscrotum with right testicle swollen and tender. Clinical diagnosis of the right epididymo-orchitis was made.  Patient was admitted for IV antibiotics.  He is not running any fever.  His temperature is 99, pulse is 72 per minute, blood pressure is 147/63.  He is fully conscious, alert, oriented, not in acute distress.  His white count on admission was 11.6.  Today, his white count was 8.9.  He has been started on antibiotics.  He is on Rocephin and doxycycline and Vibra-Tabs.  He is feeling better.  On examination, as I said, he is better.  He still has marked edema and swelling of his right hemiscrotum.  He is being discharged today.  It is okay to go home. Then, I can follow him in the office.  Finally discharge diagnosis of right epididymo-orchitis.  Continue using his antibiotic.  I have asked them to put him on oral antibiotic of Cipro.  I have told him it is going to take  several weeks.  He is going to go home and lie down in the bed.  I told him to put a roll towel to raise his scrotum above his thighs and also put some ice packs, and we will see what happens. Report to the office in 1 week.     Ky Barban, M.D.     MIJ/MEDQ  D:  08/07/2013  T:  08/07/2013  Job:  [id number]

## 2013-08-30 ENCOUNTER — Other Ambulatory Visit (HOSPITAL_COMMUNITY): Payer: Self-pay | Admitting: Urology

## 2013-08-30 DIAGNOSIS — N451 Epididymitis: Secondary | ICD-10-CM

## 2013-08-30 DIAGNOSIS — N433 Hydrocele, unspecified: Secondary | ICD-10-CM

## 2013-09-01 ENCOUNTER — Ambulatory Visit (HOSPITAL_COMMUNITY)
Admission: RE | Admit: 2013-09-01 | Discharge: 2013-09-01 | Disposition: A | Discharge status: Home / Self Care | Payer: Medicare Other | Source: Ambulatory Visit | Attending: Urology | Admitting: Urology

## 2013-09-01 ENCOUNTER — Other Ambulatory Visit (HOSPITAL_COMMUNITY): Payer: Self-pay | Admitting: Urology

## 2013-09-01 DIAGNOSIS — N451 Epididymitis: Secondary | ICD-10-CM

## 2013-09-01 DIAGNOSIS — N433 Hydrocele, unspecified: Secondary | ICD-10-CM | POA: Insufficient documentation

## 2013-09-01 DIAGNOSIS — N5089 Other specified disorders of the male genital organs: Secondary | ICD-10-CM | POA: Insufficient documentation

## 2013-09-06 ENCOUNTER — Encounter (HOSPITAL_COMMUNITY): Payer: Self-pay | Admitting: Emergency Medicine

## 2013-09-06 ENCOUNTER — Emergency Department (HOSPITAL_COMMUNITY): Payer: Medicare Other

## 2013-09-06 ENCOUNTER — Emergency Department (HOSPITAL_COMMUNITY)
Admission: EM | Admit: 2013-09-06 | Discharge: 2013-09-06 | Disposition: A | Discharge status: Home / Self Care | Payer: Medicare Other | Attending: Emergency Medicine | Admitting: Emergency Medicine

## 2013-09-06 DIAGNOSIS — R609 Edema, unspecified: Secondary | ICD-10-CM | POA: Insufficient documentation

## 2013-09-06 DIAGNOSIS — Z79899 Other long term (current) drug therapy: Secondary | ICD-10-CM | POA: Insufficient documentation

## 2013-09-06 DIAGNOSIS — F329 Major depressive disorder, single episode, unspecified: Secondary | ICD-10-CM | POA: Insufficient documentation

## 2013-09-06 DIAGNOSIS — E876 Hypokalemia: Secondary | ICD-10-CM

## 2013-09-06 DIAGNOSIS — I10 Essential (primary) hypertension: Secondary | ICD-10-CM | POA: Insufficient documentation

## 2013-09-06 DIAGNOSIS — R600 Localized edema: Secondary | ICD-10-CM

## 2013-09-06 DIAGNOSIS — N400 Enlarged prostate without lower urinary tract symptoms: Secondary | ICD-10-CM | POA: Insufficient documentation

## 2013-09-06 DIAGNOSIS — E1169 Type 2 diabetes mellitus with other specified complication: Secondary | ICD-10-CM | POA: Insufficient documentation

## 2013-09-06 DIAGNOSIS — R05 Cough: Secondary | ICD-10-CM | POA: Insufficient documentation

## 2013-09-06 DIAGNOSIS — Z794 Long term (current) use of insulin: Secondary | ICD-10-CM | POA: Insufficient documentation

## 2013-09-06 DIAGNOSIS — R059 Cough, unspecified: Secondary | ICD-10-CM | POA: Insufficient documentation

## 2013-09-06 DIAGNOSIS — E162 Hypoglycemia, unspecified: Secondary | ICD-10-CM

## 2013-09-06 DIAGNOSIS — F3289 Other specified depressive episodes: Secondary | ICD-10-CM | POA: Insufficient documentation

## 2013-09-06 DIAGNOSIS — N5089 Other specified disorders of the male genital organs: Secondary | ICD-10-CM | POA: Insufficient documentation

## 2013-09-06 DIAGNOSIS — E039 Hypothyroidism, unspecified: Secondary | ICD-10-CM | POA: Insufficient documentation

## 2013-09-06 LAB — CBC WITH DIFFERENTIAL/PLATELET
Basophils Absolute: 0 10*3/uL (ref 0.0–0.1)
Eosinophils Absolute: 0.2 10*3/uL (ref 0.0–0.7)
HCT: 35.1 % — ABNORMAL LOW (ref 39.0–52.0)
Hemoglobin: 11.7 g/dL — ABNORMAL LOW (ref 13.0–17.0)
Lymphocytes Relative: 23 % (ref 12–46)
Lymphs Abs: 1.7 10*3/uL (ref 0.7–4.0)
MCHC: 33.3 g/dL (ref 30.0–36.0)
MCV: 87.8 fL (ref 78.0–100.0)
Monocytes Absolute: 0.8 10*3/uL (ref 0.1–1.0)
Monocytes Relative: 11 % (ref 3–12)
Neutro Abs: 4.7 10*3/uL (ref 1.7–7.7)
RBC: 4 MIL/uL — ABNORMAL LOW (ref 4.22–5.81)
RDW: 15 % (ref 11.5–15.5)
WBC: 7.4 10*3/uL (ref 4.0–10.5)

## 2013-09-06 LAB — COMPREHENSIVE METABOLIC PANEL
ALT: 13 U/L (ref 0–53)
AST: 16 U/L (ref 0–37)
CO2: 31 mEq/L (ref 19–32)
Chloride: 101 mEq/L (ref 96–112)
Creatinine, Ser: 1.19 mg/dL (ref 0.50–1.35)
GFR calc Af Amer: 66 mL/min — ABNORMAL LOW (ref >90)
GFR calc non Af Amer: 57 mL/min — ABNORMAL LOW (ref >90)
Glucose, Bld: 51 mg/dL — ABNORMAL LOW (ref 70–99)
Sodium: 142 mEq/L (ref 135–145)
Total Bilirubin: 0.5 mg/dL (ref 0.3–1.2)

## 2013-09-06 LAB — PRO B NATRIURETIC PEPTIDE: Pro B Natriuretic peptide (BNP): 216.4 pg/mL (ref 0–450)

## 2013-09-06 MED ORDER — POTASSIUM CHLORIDE CRYS ER 20 MEQ PO TBCR
40.0000 meq | EXTENDED_RELEASE_TABLET | Freq: Once | ORAL | Status: AC
  Administered 2013-09-06: 40 meq via ORAL
  Filled 2013-09-06: qty 2

## 2013-09-06 MED ORDER — POTASSIUM CHLORIDE ER 20 MEQ PO TBCR: 20.0000 meq | EXTENDED_RELEASE_TABLET | Freq: Two times a day (BID) | ORAL | Status: DC

## 2013-09-06 MED ORDER — FUROSEMIDE 10 MG/ML IJ SOLN
20.0000 mg | Freq: Once | INTRAMUSCULAR | Status: AC
  Administered 2013-09-06: 20 mg via INTRAVENOUS
  Filled 2013-09-06: qty 2

## 2013-09-06 NOTE — ED Notes (Addendum)
Patient states he came to the ER because he has been to several doctors for his leg swelling and has had no help. He has bilateral lower leg swelling that extends to his abdomen with the left leg weeping. They are both red, patient denies any pain, palpable weak pedal pulses.

## 2013-09-06 NOTE — ED Notes (Signed)
Pt given orange juice, graham crackers, and peanut butter.  

## 2013-09-06 NOTE — ED Notes (Signed)
Leg swlling and testicle swelling x 1 month has seen the dr and has appointment on the 9th needs to be seen sooner

## 2013-09-06 NOTE — ED Notes (Signed)
Pt denies any CP, SOB, fever, cold, cough, dizziness. Reports worsening BLE pitting edema x 2 weeks which is now extending up to his testicles. Denies any pain due to edema. Has a foley catheter to a leg bag in place since Sept 11 & has a f/u appt with urologist 09-14-13. Reports PCP aware of BLE. Is currently not on any diuretics.

## 2013-09-06 NOTE — ED Provider Notes (Signed)
CSN: [id number]     Arrival date & time 09/06/13  1126 History   First MD Initiated Contact with Patient 09/06/13 1243     Chief Complaint  Patient presents with  . Leg Swelling   (Consider location/radiation/quality/duration/timing/severity/associated sxs/prior Treatment) The history is provided by the patient and the spouse. No language interpreter was used.  Patient with history significant for hypertension, diabetes mellitus, and urinary retention with foley catheter presents today with bilateral leg swelling in L>R and testicular swelling x 1 month. He denies pain of legs and testicles. The swelling has progressed x 1-2 weeks. He was hospitalized for 2 days at [facility] 1 month ago after he presented to the ED with testicular swelling. An US of the scrotum with doppler A/V flow was done, and he was diagnosed with epididymitis and treated with IV antibiotics. Over the month, the testicular swelling has not improved and the leg swelling has worsened. He also reports a cough productive of white sputum x 1-2 months. He denies SOB, fever, chest pain, ill contacts, numbness or tingling of the extremities, abdominal pain, nausea, and vomiting. He was seen at [facility] 2-3 weeks ago when his catheter was changed. He denies history of heart failure. He does not smoke or drink alcohol.   Past Medical History  Diagnosis Date  . Hypertension   . Diabetes mellitus   . Hypothyroidism   . Depression   . Urinary problem   . Hypokalemia   . Enlarged prostate    Past Surgical History  Procedure Laterality Date  . Hernia repair    . Transurethral resection of prostate    . Transurethral resection of prostate  09/22/2011    Procedure: TRANSURETHRAL RESECTION OF THE PROSTATE (TURP);  Surgeon: Ky Barban;  Location: [facility];  Service: Urology;  Laterality: N/A;   Family History  Problem Relation Age of Onset  . Hypertension Mother   . Hypertension Brother   . Cerebral aneurysm Mother    History   Substance Use Topics  . Smoking status: Never Smoker   . Smokeless tobacco: Never Used  . Alcohol Use: No    Review of Systems  Constitutional: Negative for fever and chills.  HENT: Negative for congestion, sore throat, facial swelling, rhinorrhea and sneezing.   Respiratory: Positive for cough. Negative for shortness of breath.   Cardiovascular: Positive for leg swelling. Negative for chest pain.  Gastrointestinal: Negative for nausea, vomiting, abdominal pain, diarrhea and abdominal distention.  Genitourinary: Positive for scrotal swelling.  Neurological: Negative for dizziness and headaches.    Allergies  Thiazide-type diuretics  Home Medications   Current Outpatient Rx  Name  Route  Sig  Dispense  Refill  . ALPRAZolam (XANAX) 1 MG tablet   Oral   Take 1 mg by mouth 3 (three) times daily as needed for sleep.          Marland Kitchen diltiazem (CARDIZEM CD) 300 MG 24 hr capsule   Oral   Take 300 mg by mouth daily.          . ferrous sulfate 325 (65 FE) MG tablet   Oral   Take 325 mg by mouth daily.          . fish oil-omega-3 fatty acids 1000 MG capsule   Oral   Take 1 capsule (1 g total) by mouth 2 (two) times daily.         . insulin detemir (LEVEMIR FLEXPEN) 100 UNIT/ML injection   Subcutaneous   Inject 70  Units into the skin at bedtime.          . insulin lispro (HUMALOG KWIKPEN) 100 UNIT/ML injection   Subcutaneous   Inject 20 Units into the skin 3 (three) times daily before meals.          Marland Kitchen levothyroxine (SYNTHROID, LEVOTHROID) 100 MCG tablet   Oral   Take 100 mcg by mouth daily.          Marland Kitchen linagliptin (TRADJENTA) 5 MG TABS tablet   Oral   Take 5 mg by mouth daily.         . niacin 500 MG tablet   Oral   Take 500 mg by mouth daily.          . potassium chloride SA (K-DUR,KLOR-CON) 20 MEQ tablet   Oral   Take 20 mEq by mouth every morning.         . tamsulosin (FLOMAX) 0.4 MG CAPS   Oral   Take 0.4 mg by mouth daily.         .  Travoprost, BAK Free, (TRAVATAN) 0.004 % SOLN ophthalmic solution   Both Eyes   Place 1 drop into both eyes at bedtime.          BP 152/90  Pulse 78  Temp(Src) 98.2 F (36.8 C) (Oral)  Resp 19  Wt 331 lb (150.141 kg)  BMI 44.88 kg/m2  SpO2 92% Physical Exam  Constitutional: He is oriented to person, place, and time. He appears well-developed and well-nourished. No distress.  HENT:  Head: Normocephalic and atraumatic.  Mouth/Throat: Oropharynx is clear and moist.  Eyes: Conjunctivae are normal. Pupils are equal, round, and reactive to light.  Neck: Normal range of motion. Neck supple. No thyromegaly present.  Cardiovascular: Normal rate and regular rhythm.   Pulmonary/Chest: Breath sounds normal. He has no wheezes. He has no rales.  Abdominal: Bowel sounds are normal. He exhibits no mass. There is no tenderness.  Obese abdomen  Genitourinary: Penis normal. No penile tenderness.  Diffuse edema of scrotum noted, scrotum is non-tender  Musculoskeletal: He exhibits edema. He exhibits no tenderness.  Pitting edema noted to lower legs bilaterally extending to the knee bilaterally, L>R Legs are non-tender to palpation, pedal pulses present bilaterally, distal sensation of legs intact.  ROM is full and equal bilaterally of hip, knee, and ankle Strength of hip, knee, and ankle is full and equal bilaterally  Lymphadenopathy:    He has no cervical adenopathy.  Neurological: He is alert and oriented to person, place, and time. Coordination normal.  No focal neurological deficit noted  Skin: Skin is warm and dry. He is not diaphoretic.  Stasis dermatitis skin changes noted of bilateral shins with L>R. There are 2 <1cm wounds of the shin that are oozing a serous fluid.  Psychiatric: He has a normal mood and affect.    ED Course  Procedures (including critical care time) Labs Review Labs Reviewed  PRO B NATRIURETIC PEPTIDE  CBC WITH DIFFERENTIAL  COMPREHENSIVE METABOLIC PANEL    Results for orders placed during the hospital encounter of 09/06/13  PRO B NATRIURETIC PEPTIDE      Result Value Range   Pro B Natriuretic peptide (BNP) 216.4  0 - 450 pg/mL  CBC WITH DIFFERENTIAL      Result Value Range   WBC 7.4  4.0 - 10.5 K/uL   RBC 4.00 (*) 4.22 - 5.81 MIL/uL   Hemoglobin 11.7 (*) 13.0 - 17.0 g/dL   HCT 16.1 (*) 09.6 -  52.0 %   MCV 87.8  78.0 - 100.0 fL   MCH 29.3  26.0 - 34.0 pg   MCHC 33.3  30.0 - 36.0 g/dL   RDW 29.5  28.4 - 13.2 %   Platelets 158  150 - 400 K/uL   Neutrophils Relative % 63  43 - 77 %   Neutro Abs 4.7  1.7 - 7.7 K/uL   Lymphocytes Relative 23  12 - 46 %   Lymphs Abs 1.7  0.7 - 4.0 K/uL   Monocytes Relative 11  3 - 12 %   Monocytes Absolute 0.8  0.1 - 1.0 K/uL   Eosinophils Relative 3  0 - 5 %   Eosinophils Absolute 0.2  0.0 - 0.7 K/uL   Basophils Relative 0  0 - 1 %   Basophils Absolute 0.0  0.0 - 0.1 K/uL  COMPREHENSIVE METABOLIC PANEL      Result Value Range   Sodium 142  135 - 145 mEq/L   Potassium 2.7 (*) 3.5 - 5.1 mEq/L   Chloride 101  96 - 112 mEq/L   CO2 31  19 - 32 mEq/L   Glucose, Bld 51 (*) 70 - 99 mg/dL   BUN 12  6 - 23 mg/dL   Creatinine, Ser 4.40  0.50 - 1.35 mg/dL   Calcium 8.9  8.4 - 10.2 mg/dL   Total Protein 8.0  6.0 - 8.3 g/dL   Albumin 3.4 (*) 3.5 - 5.2 g/dL   AST 16  0 - 37 U/L   ALT 13  0 - 53 U/L   Alkaline Phosphatase 72  39 - 117 U/L   Total Bilirubin 0.5  0.3 - 1.2 mg/dL   GFR calc non Af Amer 57 (*) >90 mL/min   GFR calc Af Amer 66 (*) >90 mL/min  GLUCOSE, CAPILLARY      Result Value Range   Glucose-Capillary 62 (*) 70 - 99 mg/dL   US Scrotum  07/01/3663   CLINICAL DATA:  Swollen scrotum.  EXAM: SCROTAL ULTRASOUND  DOPPLER ULTRASOUND OF THE TESTICLES  TECHNIQUE: Complete ultrasound examination of the testicles, epididymis, and other scrotal structures was performed. Color and spectral Doppler ultrasound were also utilized to evaluate blood flow to the testicles.  COMPARISON:  08/05/2013  FINDINGS:  Right testis: 6 cm x 3.1 cm x 3.0 cm. The testicle is diffusely heterogeneous with linear areas of decreased echogenicity which passed through it and some hypoechoic areas along the surface. Color Doppler blood flow was documented. No discrete testicular mass.  Left testis: By 0.7 cm x 2.5 cm x 2.7 cm. Left testicle is also heterogeneous, but less so than the right. No left testicular mass. Blood flow seen by color Doppler analysis.  Right epididymis: Hypervascular, but normal in size. No epididymal mass or cyst.  Left epididymis: Hypervascular, but normal in size. No epididymal mass or cyst.  Hydrocele: Complex moderate to large -sized left hydrocele with multiple septations. Minimal right hydrocele.  Varicocele:  Absent  Pulsed Doppler interrogation of both testes demonstrates low resistance arterial and venous waveforms bilaterally.  There is diffuse scrotal thickening with increased vascularity, in at least as an edematous as it was previously, possibly increased.  IMPRESSION: 1. No testicular masses. No torsion. 2. Heterogeneous appearance of the testicles, right greater left, most consistent with diffuse inflammation/edema. 3. Both epididymides are hypervascular, but nonenlarged, and are symmetric. 4. Moderate to large complex left hydrocele, which is similar to the prior exam. 5. Diffuse scrotal edema. 6.  Overall, the findings are similar to the prior study.   Electronically Signed   By: Amie Portland   On: 09/01/2013 16:07   [place] Art/ven Flow Abd Pelv Doppler  09/01/2013   CLINICAL DATA:  Swollen scrotum.  EXAM: SCROTAL ULTRASOUND  DOPPLER ULTRASOUND OF THE TESTICLES  TECHNIQUE: Complete ultrasound examination of the testicles, epididymis, and other scrotal structures was performed. Color and spectral Doppler ultrasound were also utilized to evaluate blood flow to the testicles.  COMPARISON:  08/05/2013  FINDINGS: Right testis: 6 cm x 3.1 cm x 3.0 cm. The testicle is diffusely heterogeneous with linear  areas of decreased echogenicity which passed through it and some hypoechoic areas along the surface. Color Doppler blood flow was documented. No discrete testicular mass.  Left testis: By 0.7 cm x 2.5 cm x 2.7 cm. Left testicle is also heterogeneous, but less so than the right. No left testicular mass. Blood flow seen by color Doppler analysis.  Right epididymis: Hypervascular, but normal in size. No epididymal mass or cyst.  Left epididymis: Hypervascular, but normal in size. No epididymal mass or cyst.  Hydrocele: Complex moderate to large -sized left hydrocele with multiple septations. Minimal right hydrocele.  Varicocele:  Absent  Pulsed Doppler interrogation of both testes demonstrates low resistance arterial and venous waveforms bilaterally.  There is diffuse scrotal thickening with increased vascularity, in at least as an edematous as it was previously, possibly increased.  IMPRESSION: 1. No testicular masses. No torsion. 2. Heterogeneous appearance of the testicles, right greater left, most consistent with diffuse inflammation/edema. 3. Both epididymides are hypervascular, but nonenlarged, and are symmetric. 4. Moderate to large complex left hydrocele, which is similar to the prior exam. 5. Diffuse scrotal edema. 6. Overall, the findings are similar to the prior study.   Electronically Signed   By: Amie Portland   On: 09/01/2013 16:07   Dg Chest Portable 1 View  09/06/2013   CLINICAL DATA:  Swelling of the legs and feet.  EXAM: PORTABLE CHEST - 1 VIEW  COMPARISON:  06/19/2013  FINDINGS: The heart size and mediastinal contours are within normal limits. Lungs are clear. Vascularity is normal. No acute osseous abnormality.  IMPRESSION: Normal chest.   Electronically Signed   By: Geanie Cooley   On: 09/06/2013 14:23   Date: 09/06/2013  Rate: 65   Rhythm: normal sinus rhythm  QRS Axis: normal  Intervals: normal  ST/T Wave abnormalities: normal  Conduction Disutrbances:right bundle branch block and left  anterior fascicular block  Narrative Interpretation:   Old EKG Reviewed: unchanged   Imaging Review No results found.  MDM  No diagnosis found. 1. Peripheral edema 2. Hypokalemia 3. Hypoglycemia  Lasix 20 mg IV given in ED, potassium supplemented with oral 40 mEq K+. He remains comfortable, remains alert and oriented. CBG 51 - meal given. Discussed with Dr. Criss Alvine. Plan: disposition home with follow up with PCP this week to decide diuretic therapy; potassium supplementation.     Arnoldo Hooker, PA-C 09/06/13 1608

## 2013-09-08 NOTE — ED Provider Notes (Signed)
Medical screening examination/treatment/procedure(s) were conducted as a shared visit with non-physician practitioner(s) and myself.  I personally evaluated the patient during the encounter   Patient with no respiratory symptoms, only peripheral edema. Already r/o for DVT with similar symptoms a few weeks ago. Less likely to be DVT with bilateral sx as well. Given lasix prior to electrolytes, will replete K and give K supplementation as outpatient. Will let PCP arrange diuretic therapy when K is better controlled.  Audree Camel, MD 09/08/13 1016

## 2013-11-13 ENCOUNTER — Ambulatory Visit (HOSPITAL_COMMUNITY)
Admission: RE | Admit: 2013-11-13 | Discharge: 2013-11-13 | Disposition: A | Discharge status: Home / Self Care | Payer: Medicare Other | Source: Ambulatory Visit | Attending: Nephrology | Admitting: Nephrology

## 2013-11-13 DIAGNOSIS — I10 Essential (primary) hypertension: Secondary | ICD-10-CM

## 2013-11-13 DIAGNOSIS — N189 Chronic kidney disease, unspecified: Secondary | ICD-10-CM | POA: Insufficient documentation

## 2013-11-13 DIAGNOSIS — I129 Hypertensive chronic kidney disease with stage 1 through stage 4 chronic kidney disease, or unspecified chronic kidney disease: Secondary | ICD-10-CM | POA: Insufficient documentation

## 2013-11-13 DIAGNOSIS — E669 Obesity, unspecified: Secondary | ICD-10-CM | POA: Insufficient documentation

## 2013-11-13 DIAGNOSIS — I517 Cardiomegaly: Secondary | ICD-10-CM | POA: Insufficient documentation

## 2013-11-13 DIAGNOSIS — E119 Type 2 diabetes mellitus without complications: Secondary | ICD-10-CM | POA: Insufficient documentation

## 2013-11-13 DIAGNOSIS — I77819 Aortic ectasia, unspecified site: Secondary | ICD-10-CM | POA: Insufficient documentation

## 2013-11-13 DIAGNOSIS — I059 Rheumatic mitral valve disease, unspecified: Secondary | ICD-10-CM | POA: Insufficient documentation

## 2013-11-13 DIAGNOSIS — Z6841 Body Mass Index (BMI) 40.0 and over, adult: Secondary | ICD-10-CM | POA: Insufficient documentation

## 2013-11-13 NOTE — Progress Notes (Signed)
*  PRELIMINARY RESULTS* Echocardiogram 2D Echocardiogram has been performed.  Garrett Bradley 11/13/2013, 1:49 PM

## 2013-11-14 ENCOUNTER — Other Ambulatory Visit (HOSPITAL_COMMUNITY): Payer: Medicare Other

## 2014-03-06 ENCOUNTER — Encounter (HOSPITAL_COMMUNITY): Payer: Self-pay | Admitting: Emergency Medicine

## 2014-03-06 ENCOUNTER — Emergency Department (HOSPITAL_COMMUNITY)
Admission: EM | Admit: 2014-03-06 | Discharge: 2014-03-06 | Disposition: A | Discharge status: Home / Self Care | Payer: Medicare HMO | Attending: Emergency Medicine | Admitting: Emergency Medicine

## 2014-03-06 DIAGNOSIS — E039 Hypothyroidism, unspecified: Secondary | ICD-10-CM | POA: Diagnosis not present

## 2014-03-06 DIAGNOSIS — N399 Disorder of urinary system, unspecified: Secondary | ICD-10-CM | POA: Insufficient documentation

## 2014-03-06 DIAGNOSIS — I10 Essential (primary) hypertension: Secondary | ICD-10-CM | POA: Insufficient documentation

## 2014-03-06 DIAGNOSIS — N400 Enlarged prostate without lower urinary tract symptoms: Secondary | ICD-10-CM | POA: Diagnosis not present

## 2014-03-06 DIAGNOSIS — Z79899 Other long term (current) drug therapy: Secondary | ICD-10-CM | POA: Insufficient documentation

## 2014-03-06 DIAGNOSIS — K5900 Constipation, unspecified: Secondary | ICD-10-CM | POA: Diagnosis not present

## 2014-03-06 DIAGNOSIS — F329 Major depressive disorder, single episode, unspecified: Secondary | ICD-10-CM | POA: Diagnosis not present

## 2014-03-06 DIAGNOSIS — I83009 Varicose veins of unspecified lower extremity with ulcer of unspecified site: Secondary | ICD-10-CM | POA: Insufficient documentation

## 2014-03-06 DIAGNOSIS — E876 Hypokalemia: Secondary | ICD-10-CM | POA: Diagnosis not present

## 2014-03-06 DIAGNOSIS — R609 Edema, unspecified: Secondary | ICD-10-CM

## 2014-03-06 DIAGNOSIS — E669 Obesity, unspecified: Secondary | ICD-10-CM | POA: Insufficient documentation

## 2014-03-06 DIAGNOSIS — L97909 Non-pressure chronic ulcer of unspecified part of unspecified lower leg with unspecified severity: Principal | ICD-10-CM

## 2014-03-06 DIAGNOSIS — F3289 Other specified depressive episodes: Secondary | ICD-10-CM | POA: Insufficient documentation

## 2014-03-06 DIAGNOSIS — Z794 Long term (current) use of insulin: Secondary | ICD-10-CM | POA: Diagnosis not present

## 2014-03-06 DIAGNOSIS — E119 Type 2 diabetes mellitus without complications: Secondary | ICD-10-CM | POA: Diagnosis not present

## 2014-03-06 LAB — CBC WITH DIFFERENTIAL/PLATELET
Basophils Absolute: 0 10*3/uL (ref 0.0–0.1)
Basophils Relative: 1 % (ref 0–1)
Eosinophils Absolute: 0.1 10*3/uL (ref 0.0–0.7)
Eosinophils Relative: 2 % (ref 0–5)
HCT: 34.5 % — ABNORMAL LOW (ref 39.0–52.0)
Hemoglobin: 11.2 g/dL — ABNORMAL LOW (ref 13.0–17.0)
LYMPHS ABS: 1.6 10*3/uL (ref 0.7–4.0)
Lymphocytes Relative: 24 % (ref 12–46)
MCH: 28.8 pg (ref 26.0–34.0)
MCHC: 32.5 g/dL (ref 30.0–36.0)
MCV: 88.7 fL (ref 78.0–100.0)
Monocytes Absolute: 0.6 10*3/uL (ref 0.1–1.0)
Monocytes Relative: 8 % (ref 3–12)
NEUTROS ABS: 4.2 10*3/uL (ref 1.7–7.7)
NEUTROS PCT: 65 % (ref 43–77)
PLATELETS: 201 10*3/uL (ref 150–400)
RBC: 3.89 MIL/uL — AB (ref 4.22–5.81)
RDW: 14.9 % (ref 11.5–15.5)
WBC: 6.5 10*3/uL (ref 4.0–10.5)

## 2014-03-06 LAB — COMPREHENSIVE METABOLIC PANEL
ALK PHOS: 72 U/L (ref 39–117)
ALT: 14 U/L (ref 0–53)
AST: 18 U/L (ref 0–37)
Albumin: 3 g/dL — ABNORMAL LOW (ref 3.5–5.2)
BILIRUBIN TOTAL: 0.3 mg/dL (ref 0.3–1.2)
BUN: 23 mg/dL (ref 6–23)
CO2: 29 mEq/L (ref 19–32)
Calcium: 9.2 mg/dL (ref 8.4–10.5)
Chloride: 101 mEq/L (ref 96–112)
Creatinine, Ser: 1.53 mg/dL — ABNORMAL HIGH (ref 0.50–1.35)
GFR calc non Af Amer: 42 mL/min — ABNORMAL LOW (ref >90)
GFR, EST AFRICAN AMERICAN: 48 mL/min — AB (ref >90)
Glucose, Bld: 198 mg/dL — ABNORMAL HIGH (ref 70–99)
POTASSIUM: 3.7 meq/L (ref 3.7–5.3)
SODIUM: 141 meq/L (ref 137–147)
Total Protein: 8.4 g/dL — ABNORMAL HIGH (ref 6.0–8.3)

## 2014-03-06 LAB — CBG MONITORING, ED: Glucose-Capillary: 166 mg/dL — ABNORMAL HIGH (ref 70–99)

## 2014-03-06 MED ORDER — FUROSEMIDE 40 MG PO TABS
40.0000 mg | ORAL_TABLET | Freq: Once | ORAL | Status: AC
  Administered 2014-03-06: 40 mg via ORAL
  Filled 2014-03-06: qty 1

## 2014-03-06 MED ORDER — FUROSEMIDE 20 MG PO TABS: 20.0000 mg | ORAL_TABLET | Freq: Every day | ORAL | Status: DC

## 2014-03-06 NOTE — ED Provider Notes (Signed)
CSN: [id number]     Arrival date & time 03/06/14  1427 History  This chart was scribed for Donnetta HutchingBrian Anaiza Behrens, MD by Danella Maiersaroline Early, ED Scribe. This patient was seen in room APA07/AP[facility]07 and the patient's care was started at 4:18 PM.    No chief complaint on file.    (Consider location/radiation/quality/duration/timing/severity/associated sxs/prior Treatment) The history is provided by the patient. No language interpreter was used.   HPI Comments: Garrett Bradley is a 80 y.o. male with a h/o HTN, hypothyroidism, and DM who presents to the Emergency Department complaining of gradual-onset, gradually-worsening bilateral feet and lower leg swelling onset 2 weeks ago. He reports prior h/o the same. He denies associated SOB, CP, or abdominal pain He reports recent constipation and straining with BMs that has now resolved.    PCP - Assunta FoundJohn [name]  Past Medical History  Diagnosis Date  . Hypertension   . Diabetes mellitus   . Hypothyroidism   . Depression   . Urinary problem   . Hypokalemia   . Enlarged prostate    Past Surgical History  Procedure Laterality Date  . Hernia repair    . Transurethral resection of prostate    . Transurethral resection of prostate  09/22/2011    Procedure: TRANSURETHRAL RESECTION OF THE PROSTATE (TURP);  Surgeon: Ky BarbanMohammad I Javaid;  Location: [facility];  Service: Urology;  Laterality: N/A;   Family History  Problem Relation Age of Onset  . Hypertension Mother   . Hypertension Brother   . Cerebral aneurysm Mother    History  Substance Use Topics  . Smoking status: Never Smoker   . Smokeless tobacco: Never Used  . Alcohol Use: No    Review of Systems  Respiratory: Negative for shortness of breath.   Cardiovascular: Positive for leg swelling. Negative for chest pain.  Gastrointestinal: Positive for constipation. Negative for abdominal pain.   A complete 10 system review of systems was obtained and all systems are negative except as noted in the HPI and PMH.      Allergies  Thiazide-type diuretics  Home Medications   Current Outpatient Rx  Name  Route  Sig  Dispense  Refill  . ALPRAZolam (XANAX) 1 MG tablet   Oral   Take 1 mg by mouth at bedtime as needed for sleep.          Marland Kitchen. diltiazem (CARDIZEM CD) 300 MG 24 hr capsule   Oral   Take 300 mg by mouth daily.          . ferrous sulfate 325 (65 FE) MG tablet   Oral   Take 325 mg by mouth daily.          . fish oil-omega-3 fatty acids 1000 MG capsule   Oral   Take 1 capsule (1 g total) by mouth 2 (two) times daily.         . insulin detemir (LEVEMIR FLEXPEN) 100 UNIT/ML injection   Subcutaneous   Inject 50 Units into the skin at bedtime.          . insulin lispro (HUMALOG KWIKPEN) 100 UNIT/ML injection   Subcutaneous   Inject 10 Units into the skin 3 (three) times daily before meals.          Marland Kitchen. levothyroxine (SYNTHROID, LEVOTHROID) 125 MCG tablet   Oral   Take 125 mcg by mouth daily before breakfast.         . linagliptin (TRADJENTA) 5 MG TABS tablet   Oral   Take 5  mg by mouth daily.         . potassium chloride SA (K-DUR,KLOR-CON) 20 MEQ tablet   Oral   Take 20-40 mEq by mouth 2 (two) times daily. Takes two tablets in the morning and takes one in the afternoon         . tamsulosin (FLOMAX) 0.4 MG CAPS   Oral   Take 0.4 mg by mouth daily.         . Travoprost, BAK Free, (TRAVATAN) 0.004 % SOLN ophthalmic solution   Both Eyes   Place 1 drop into both eyes at bedtime.         . furosemide (LASIX) 20 MG tablet   Oral   Take 1 tablet (20 mg total) by mouth daily.   15 tablet   1    BP 148/67  Pulse 80  Temp(Src) 98.6 F (37 C) (Oral)  Resp 18  Ht 6\' 4"  (1.93 m)  Wt 333 lb (151.048 kg)  BMI 40.55 kg/m2  SpO2 97% Physical Exam  Nursing note and vitals reviewed. Constitutional: He is oriented to person, place, and time. He appears well-developed and well-nourished.  Obese  HENT:  Head: Normocephalic and atraumatic.  Eyes: Conjunctivae  and EOM are normal. Pupils are equal, round, and reactive to light.  Neck: Normal range of motion. Neck supple.  Cardiovascular: Normal rate, regular rhythm and normal heart sounds.   Pulmonary/Chest: Effort normal and breath sounds normal.  Abdominal: Soft. Bowel sounds are normal.  Musculoskeletal: Normal range of motion. He exhibits edema (3-4+ peripheral edema and venous stasis ulcers).  Neurological: He is alert and oriented to person, place, and time.  Skin: Skin is warm and dry.  Psychiatric: He has a normal mood and affect. His behavior is normal.    ED Course  Procedures (including critical care time) Medications  furosemide (LASIX) tablet 40 mg (40 mg Oral Given 03/06/14 1709)    DIAGNOSTIC STUDIES: Oxygen Saturation is 97% on RA, normal by my interpretation.    COORDINATION OF CARE: 4:35 PM- Discussed treatment plan with pt which includes discharge home with diuretic. Pt agrees to plan.    Labs Review Labs Reviewed  CBC WITH DIFFERENTIAL - Abnormal; Notable for the following:    RBC 3.89 (*)    Hemoglobin 11.2 (*)    HCT 34.5 (*)    All other components within normal limits  COMPREHENSIVE METABOLIC PANEL - Abnormal; Notable for the following:    Glucose, Bld 198 (*)    Creatinine, Ser 1.53 (*)    Total Protein 8.4 (*)    Albumin 3.0 (*)    GFR calc non Af Amer 42 (*)    GFR calc Af Amer 48 (*)    All other components within normal limits  CBG MONITORING, ED - Abnormal; Notable for the following:    Glucose-Capillary 166 (*)    All other components within normal limits   Imaging Review No results found.   EKG Interpretation None      MDM   Final diagnoses:  Peripheral edema  Venous stasis ulcer    Patient has significant peripheral edema with chronic venous stasis ulcer. We'll start low-dose furosemide. Followup primary care Dr.  I personally performed the services described in this documentation, which was scribed in my presence. The recorded  information has been reviewed and is accurate.    Donnetta Hutching, MD 03/06/14 [phone]

## 2014-03-06 NOTE — ED Notes (Signed)
Swelling of feet and legs for 2 weeks.,  Wants rt thumb checked after fall 3 weeks ago.

## 2014-03-06 NOTE — Discharge Instructions (Signed)
Elevate legs, decrease salt in your diet, start small dose of fluids pill. Followup your primary care Dr.

## 2014-04-04 ENCOUNTER — Emergency Department (HOSPITAL_COMMUNITY)
Admission: EM | Admit: 2014-04-04 | Discharge: 2014-04-05 | Disposition: A | Discharge status: Home / Self Care | Payer: Medicare HMO | Attending: Emergency Medicine | Admitting: Emergency Medicine

## 2014-04-04 ENCOUNTER — Encounter (HOSPITAL_COMMUNITY): Payer: Self-pay | Admitting: Emergency Medicine

## 2014-04-04 ENCOUNTER — Emergency Department (HOSPITAL_COMMUNITY): Payer: Medicare HMO

## 2014-04-04 ENCOUNTER — Other Ambulatory Visit: Payer: Self-pay

## 2014-04-04 DIAGNOSIS — M7989 Other specified soft tissue disorders: Secondary | ICD-10-CM | POA: Insufficient documentation

## 2014-04-04 DIAGNOSIS — E119 Type 2 diabetes mellitus without complications: Secondary | ICD-10-CM | POA: Insufficient documentation

## 2014-04-04 DIAGNOSIS — F3289 Other specified depressive episodes: Secondary | ICD-10-CM | POA: Insufficient documentation

## 2014-04-04 DIAGNOSIS — E039 Hypothyroidism, unspecified: Secondary | ICD-10-CM | POA: Insufficient documentation

## 2014-04-04 DIAGNOSIS — Z794 Long term (current) use of insulin: Secondary | ICD-10-CM | POA: Insufficient documentation

## 2014-04-04 DIAGNOSIS — N390 Urinary tract infection, site not specified: Secondary | ICD-10-CM | POA: Insufficient documentation

## 2014-04-04 DIAGNOSIS — Z79899 Other long term (current) drug therapy: Secondary | ICD-10-CM | POA: Insufficient documentation

## 2014-04-04 DIAGNOSIS — F329 Major depressive disorder, single episode, unspecified: Secondary | ICD-10-CM | POA: Insufficient documentation

## 2014-04-04 DIAGNOSIS — I10 Essential (primary) hypertension: Secondary | ICD-10-CM | POA: Insufficient documentation

## 2014-04-04 LAB — CBC WITH DIFFERENTIAL/PLATELET
BASOS ABS: 0 10*3/uL (ref 0.0–0.1)
BASOS PCT: 0 % (ref 0–1)
Eosinophils Absolute: 0.2 10*3/uL (ref 0.0–0.7)
Eosinophils Relative: 2 % (ref 0–5)
HCT: 37.7 % — ABNORMAL LOW (ref 39.0–52.0)
HEMOGLOBIN: 12.4 g/dL — AB (ref 13.0–17.0)
Lymphocytes Relative: 22 % (ref 12–46)
Lymphs Abs: 1.6 10*3/uL (ref 0.7–4.0)
MCH: 28.8 pg (ref 26.0–34.0)
MCHC: 32.9 g/dL (ref 30.0–36.0)
MCV: 87.5 fL (ref 78.0–100.0)
MONO ABS: 0.8 10*3/uL (ref 0.1–1.0)
MONOS PCT: 10 % (ref 3–12)
NEUTROS PCT: 66 % (ref 43–77)
Neutro Abs: 5 10*3/uL (ref 1.7–7.7)
Platelets: 190 10*3/uL (ref 150–400)
RBC: 4.31 MIL/uL (ref 4.22–5.81)
RDW: 14.9 % (ref 11.5–15.5)
WBC: 7.5 10*3/uL (ref 4.0–10.5)

## 2014-04-04 NOTE — ED Provider Notes (Signed)
CSN: [id number]     Arrival date & time 04/04/14  2005 History   This chart was scribed for Joya Gaskins, MD by Charline Bills, ED Scribe. The patient was seen in room APA10/APA10. Patient's care was started at 11:12 PM.    Chief Complaint  Patient presents with  . Weakness   Patient is a 80 y.o. male presenting with weakness. The history is provided by the patient. No language interpreter was used.  Weakness This is a new problem. The current episode started more than 1 week ago. The problem occurs constantly. The problem has been gradually worsening. Pertinent negatives include no chest pain, no abdominal pain, no headaches and no shortness of breath. The symptoms are aggravated by walking. The symptoms are relieved by rest.  HPI Comments: Garrett Bradley is a 80 y.o. male who presents to the Emergency Department complaining of gradually worsening weakness all over onset 2-3 weeks ago. He also reports bilateral leg swelling. He also reports more urine than usual. Pt denies fever, vomiting, diarrhea, blood in stools, chest pain, SOB, abdominal pain, numbness in extremities, urinary symptoms, HA and visual disturbances. Symptoms are worse with movement and better with sitting. He reports taking a new bp medication for 1 month.   Past Medical History  Diagnosis Date  . Hypertension   . Diabetes mellitus   . Hypothyroidism   . Depression   . Urinary problem   . Hypokalemia   . Enlarged prostate    Past Surgical History  Procedure Laterality Date  . Hernia repair    . Transurethral resection of prostate    . Transurethral resection of prostate  09/22/2011    Procedure: TRANSURETHRAL RESECTION OF THE PROSTATE (TURP);  Surgeon: Ky Barban;  Location: [facility];  Service: Urology;  Laterality: N/A;   Family History  Problem Relation Age of Onset  . Hypertension Mother   . Hypertension Brother   . Cerebral aneurysm Mother    History  Substance Use Topics  . Smoking status:  Never Smoker   . Smokeless tobacco: Never Used  . Alcohol Use: No    Review of Systems  Constitutional: Negative for fever.  Eyes: Negative for visual disturbance.  Respiratory: Negative for shortness of breath.   Cardiovascular: Positive for leg swelling. Negative for chest pain.  Gastrointestinal: Negative for vomiting, abdominal pain, diarrhea and blood in stool.  Endocrine: Positive for polyuria.  Genitourinary: Negative for dysuria.  Neurological: Positive for weakness. Negative for numbness and headaches.  All other systems reviewed and are negative.   Allergies  Thiazide-type diuretics  Home Medications   Prior to Admission medications   Medication Sig Start Date End Date Taking? Authorizing Provider  ALPRAZolam Prudy Feeler) 1 MG tablet Take 1 mg by mouth at bedtime as needed for sleep.  10/02/11  Yes Christiane Ha, MD  clobetasol ointment (TEMOVATE) 0.05 % Apply 1 application topically daily as needed. For legs 02/09/14  Yes Historical Provider, MD  diltiazem (CARDIZEM CD) 300 MG 24 hr capsule Take 300 mg by mouth daily.    Yes Historical Provider, MD  ferrous sulfate 325 (65 FE) MG tablet Take 325 mg by mouth daily.  10/17/11  Yes Vassie Loll, MD  fish oil-omega-3 fatty acids 1000 MG capsule Take 1 capsule (1 g total) by mouth 2 (two) times daily. 10/02/11  Yes Christiane Ha, MD  furosemide (LASIX) 20 MG tablet Take 40 mg by mouth 2 (two) times daily. 03/06/14  Yes Donnetta Hutching, MD  insulin detemir (LEVEMIR FLEXPEN) 100 UNIT/ML injection Inject 60 Units into the skin at bedtime.    Yes Historical Provider, MD  insulin lispro (HUMALOG KWIKPEN) 100 UNIT/ML injection Inject 12 Units into the skin 3 (three) times daily before meals.    Yes Historical Provider, MD  levothyroxine (SYNTHROID, LEVOTHROID) 125 MCG tablet Take 125 mcg by mouth daily before breakfast.   Yes Historical Provider, MD  linagliptin (TRADJENTA) 5 MG TABS tablet Take 5 mg by mouth daily.   Yes Historical  Provider, MD  potassium chloride SA (K-DUR,KLOR-CON) 20 MEQ tablet Take 20-40 mEq by mouth 2 (two) times daily. Takes two tablets in the morning and takes one in the afternoon 02/15/13  Yes Gwenyth BenderKaren M Black, NP  tamsulosin (FLOMAX) 0.4 MG CAPS Take 0.4 mg by mouth daily.   Yes Historical Provider, MD  terazosin (HYTRIN) 10 MG capsule Take 10 mg by mouth daily. 01/29/14  Yes Historical Provider, MD  Travoprost, BAK Free, (TRAVATAN) 0.004 % SOLN ophthalmic solution Place 1 drop into both eyes at bedtime.   Yes Historical Provider, MD   BP 167/72  Pulse 86  Temp(Src) 98.1 F (36.7 C) (Oral)  Resp 16  Ht 6\' 4"  (1.93 m)  Wt 345 lb (156.491 kg)  BMI 42.01 kg/m2  SpO2 93%  Physical Exam CONSTITUTIONAL: Well developed/well nourished HEAD: Normocephalic/atraumatic EYES: EOMI/PERRL ENMT: Mucous membranes moist NECK: supple no meningeal signs SPINE:entire spine nontender CV: S1/S2 noted, no murmurs/rubs/gallops noted LUNGS: coarse breath sounds bilaterally  ABDOMEN: soft, nontender, no rebound or guarding GU:no cva tenderness NEURO: Pt is awake/alert, moves all extremitiesx4, no facial droop, no arm or leg drift EXTREMITIES: pulses normal, full ROM, no sensory deficit, symmetric edema in lower extremities  SKIN: warm, color normal PSYCH: no abnormalities of mood noted    ED Course  Procedures  DIAGNOSTIC STUDIES: Oxygen Saturation is 93% on RA, low by my interpretation.    COORDINATION OF CARE: 11:19 PM-Discussed treatment plan with pt at bedside and pt agreed to plan.  UTI noted Pt feels well.  He is in no distress.  He is ambulatory.  He denies cp/sob.   He is not septic appearing I feel he is safe/stable for discharge home and outpatient management of his UTI   Labs Review Labs Reviewed  CBC WITH DIFFERENTIAL - Abnormal; Notable for the following:    Hemoglobin 12.4 (*)    HCT 37.7 (*)    All other components within normal limits  BASIC METABOLIC PANEL - Abnormal; Notable for  the following:    Potassium 3.5 (*)    Glucose, Bld 115 (*)    Creatinine, Ser 1.40 (*)    GFR calc non Af Amer 47 (*)    GFR calc Af Amer 54 (*)    All other components within normal limits  URINALYSIS, ROUTINE W REFLEX MICROSCOPIC  I-STAT TROPOININ, ED    Imaging Review Dg Chest Portable 1 View  04/04/2014   CLINICAL DATA:  Weakness for 3 weeks.  EXAM: PORTABLE CHEST - 1 VIEW  COMPARISON:  Chest radiograph performed 09/06/2013  FINDINGS: The lungs are well-aerated. Vascular congestion is noted. Mild bilateral atelectasis is seen. There is no evidence of pleural effusion or pneumothorax.  The cardiomediastinal silhouette is mildly enlarged. No acute osseous abnormalities are seen.  IMPRESSION: Vascular congestion and mild cardiomegaly noted. Mild bilateral atelectasis seen.   Electronically Signed   By: Roanna RaiderJeffery  Chang M.D.   On: 04/04/2014 23:50      MDM  Final diagnoses:  UTI (lower urinary tract infection)    Nursing notes including past medical history and social history reviewed and considered in documentation xrays reviewed and considered Labs/vital reviewed and considered   I personally performed the services described in this documentation, which was scribed in my presence. The recorded information has been reviewed and is accurate.    Joya Gaskinsonald W Sheilah Rayos, MD 04/05/14 [phone]

## 2014-04-04 NOTE — ED Notes (Addendum)
Pt reporting generalized weakness for approximately 2 weeks.  States that he has not notified family physician.  States that he isn't sure why he has been weak. No distress noted at this time.  Pt reports only recent change is an increase in Lasix to 40mg  BID.   Reporting change aprox 3 weeks ago.

## 2014-04-05 LAB — I-STAT TROPONIN, ED: Troponin i, poc: 0 ng/mL (ref 0.00–0.08)

## 2014-04-05 LAB — URINALYSIS, ROUTINE W REFLEX MICROSCOPIC
BILIRUBIN URINE: NEGATIVE
GLUCOSE, UA: NEGATIVE mg/dL
KETONES UR: NEGATIVE mg/dL
Nitrite: POSITIVE — AB
Protein, ur: NEGATIVE mg/dL
Specific Gravity, Urine: 1.015 (ref 1.005–1.030)
UROBILINOGEN UA: 0.2 mg/dL (ref 0.0–1.0)
pH: 5.5 (ref 5.0–8.0)

## 2014-04-05 LAB — URINE MICROSCOPIC-ADD ON

## 2014-04-05 LAB — BASIC METABOLIC PANEL
BUN: 18 mg/dL (ref 6–23)
CO2: 31 mEq/L (ref 19–32)
CREATININE: 1.4 mg/dL — AB (ref 0.50–1.35)
Calcium: 9.2 mg/dL (ref 8.4–10.5)
Chloride: 101 mEq/L (ref 96–112)
GFR calc Af Amer: 54 mL/min — ABNORMAL LOW (ref >90)
GFR, EST NON AFRICAN AMERICAN: 47 mL/min — AB (ref >90)
Glucose, Bld: 115 mg/dL — ABNORMAL HIGH (ref 70–99)
Potassium: 3.5 mEq/L — ABNORMAL LOW (ref 3.7–5.3)
Sodium: 143 mEq/L (ref 137–147)

## 2014-04-05 MED ORDER — DEXTROSE 5 % IV SOLN
1.0000 g | Freq: Once | INTRAVENOUS | Status: AC
  Administered 2014-04-05: 1 g via INTRAVENOUS
  Filled 2014-04-05: qty 10

## 2014-04-05 MED ORDER — CEPHALEXIN 500 MG PO CAPS: 500.0000 mg | ORAL_CAPSULE | Freq: Four times a day (QID) | ORAL | Status: DC

## 2014-04-05 NOTE — ED Provider Notes (Signed)
Date: 04/04/2014 2330 (late entry into chart)  Rate: 64  Rhythm: normal sinus rhythm  QRS Axis: left  Intervals: normal  ST/T Wave abnormalities: nonspecific ST changes  Conduction Disutrbances:right bundle branch block  Narrative Interpretation:   Old EKG Reviewed: unchanged from prior EKG    Joya Gaskinsonald W Theoplis Garciagarcia, MD 04/05/14 [phone]

## 2014-04-05 NOTE — ED Notes (Addendum)
Pt. Ambulated independently  200 ft. without difficulty.

## 2014-04-07 LAB — URINE CULTURE: Colony Count: >=100000

## 2014-04-08 ENCOUNTER — Telehealth (HOSPITAL_BASED_OUTPATIENT_CLINIC_OR_DEPARTMENT_OTHER): Payer: Self-pay | Admitting: Emergency Medicine

## 2014-04-08 NOTE — Telephone Encounter (Signed)
Post ED Visit - Positive Culture Follow-up: Successful Patient Follow-Up  Culture assessed and recommendations reviewed by: []  Wes Dulaney, Pharm.D., BCPS []  Celedonio MiyamotoJeremy Frens, Pharm.D., BCPS []  Georgina PillionElizabeth [name], [address] BoulevardPharm.D., BCPS [x]  Sandy SpringsMinh Pham, [place]arm[place]D., BCPS, AAHIVP []  Estella HuskMichelle Turner, Pharm.D., BCPS, AAHIVP  Positive urine culture  []  Patient discharged without antimicrobial prescription and treatment is now indicated [x]  Organism is resistant to prescribed ED discharge antimicrobial []  Patient with positive blood cultures  Changes discussed with ED provider: Mellody DrownLauren Parker PA-C New antibiotic prescription: Septra DS 1 tab PO BID x 5 days    [facility] HospitalKylie Hollyanne Schloesser 04/08/2014, 6:10 PM

## 2014-04-08 NOTE — Progress Notes (Signed)
ED Antimicrobial Stewardship Positive Culture Follow Up   Garrett Bradley is an 80 y.o. male [facility] with a chief complaint of  Chief Complaint  Patient presents with  . Weakness    Recent Results (from the past 720 hour(s))  URINE CULTURE     Status: None   Collection Time    04/05/14  1:16 AM      Result Value Ref Range Status   Specimen Description URINE, CLEAN CATCH   Final   Special Requests NONE   Final   Culture  Setup Time     Final   Value: 04/05/2014 02:45     [facility]formed at [facility]   Colony Count     Final   Value: >=100,000 COLONIES/ML     [facility]   Culture     Final   Value: ENTEROBACTER ASBURIAE     [facility]formed at [facility]   Report Status 04/07/2014 FINAL   Final   Organism ID, Bacteria ENTEROBACTER ASBURIAE   Final    [x]  Treated with Keflex, organism resistant to prescribed antimicrobial Pt came with weakess. He was sent out on keflex for UTI. Urine culture came back with enterobacter that is resistant to it. Will use septra instead.   New antibiotic prescription: Dc Keflex Start septra DS 1 PO BID x5 days  ED Provider: Mellody DrownLauren Parker, PA  Ulyses SouthwardMinh Camilo Mander, PharmD Pager: [phone] Infectious Diseases Pharmacist Phone# [phone]

## 2014-04-19 ENCOUNTER — Telehealth (HOSPITAL_BASED_OUTPATIENT_CLINIC_OR_DEPARTMENT_OTHER): Payer: Self-pay | Admitting: Emergency Medicine

## 2014-04-19 NOTE — Telephone Encounter (Signed)
Pt returned call, Stated he saw his PCP who prescribed Septra. Sensitive to same

## 2014-06-24 ENCOUNTER — Inpatient Hospital Stay (HOSPITAL_COMMUNITY)
Admission: EM | Admit: 2014-06-24 | Discharge: 2014-06-25 | DRG: 690 | Disposition: A | Discharge status: Home / Self Care | Payer: Medicare HMO | Attending: Internal Medicine | Admitting: Internal Medicine

## 2014-06-24 ENCOUNTER — Emergency Department (HOSPITAL_COMMUNITY): Payer: Medicare HMO

## 2014-06-24 ENCOUNTER — Observation Stay (HOSPITAL_COMMUNITY): Payer: Medicare HMO

## 2014-06-24 ENCOUNTER — Encounter (HOSPITAL_COMMUNITY): Payer: Self-pay | Admitting: Emergency Medicine

## 2014-06-24 DIAGNOSIS — Z8249 Family history of ischemic heart disease and other diseases of the circulatory system: Secondary | ICD-10-CM | POA: Diagnosis not present

## 2014-06-24 DIAGNOSIS — N390 Urinary tract infection, site not specified: Secondary | ICD-10-CM | POA: Diagnosis not present

## 2014-06-24 DIAGNOSIS — E039 Hypothyroidism, unspecified: Secondary | ICD-10-CM | POA: Diagnosis present

## 2014-06-24 DIAGNOSIS — N179 Acute kidney failure, unspecified: Secondary | ICD-10-CM | POA: Diagnosis present

## 2014-06-24 DIAGNOSIS — N400 Enlarged prostate without lower urinary tract symptoms: Secondary | ICD-10-CM | POA: Diagnosis present

## 2014-06-24 DIAGNOSIS — F329 Major depressive disorder, single episode, unspecified: Secondary | ICD-10-CM | POA: Diagnosis present

## 2014-06-24 DIAGNOSIS — R5383 Other fatigue: Secondary | ICD-10-CM | POA: Diagnosis present

## 2014-06-24 DIAGNOSIS — F3289 Other specified depressive episodes: Secondary | ICD-10-CM | POA: Diagnosis present

## 2014-06-24 DIAGNOSIS — I10 Essential (primary) hypertension: Secondary | ICD-10-CM | POA: Diagnosis present

## 2014-06-24 DIAGNOSIS — E119 Type 2 diabetes mellitus without complications: Secondary | ICD-10-CM | POA: Diagnosis present

## 2014-06-24 DIAGNOSIS — N184 Chronic kidney disease, stage 4 (severe): Secondary | ICD-10-CM | POA: Diagnosis present

## 2014-06-24 DIAGNOSIS — K5900 Constipation, unspecified: Secondary | ICD-10-CM | POA: Diagnosis present

## 2014-06-24 DIAGNOSIS — K56609 Unspecified intestinal obstruction, unspecified as to partial versus complete obstruction: Secondary | ICD-10-CM | POA: Diagnosis present

## 2014-06-24 DIAGNOSIS — Z792 Long term (current) use of antibiotics: Secondary | ICD-10-CM | POA: Diagnosis not present

## 2014-06-24 DIAGNOSIS — E1129 Type 2 diabetes mellitus with other diabetic kidney complication: Secondary | ICD-10-CM | POA: Diagnosis present

## 2014-06-24 DIAGNOSIS — R5381 Other malaise: Secondary | ICD-10-CM | POA: Diagnosis present

## 2014-06-24 DIAGNOSIS — R110 Nausea: Secondary | ICD-10-CM | POA: Diagnosis present

## 2014-06-24 DIAGNOSIS — I129 Hypertensive chronic kidney disease with stage 1 through stage 4 chronic kidney disease, or unspecified chronic kidney disease: Secondary | ICD-10-CM | POA: Diagnosis present

## 2014-06-24 DIAGNOSIS — N289 Disorder of kidney and ureter, unspecified: Secondary | ICD-10-CM | POA: Diagnosis not present

## 2014-06-24 DIAGNOSIS — Z79899 Other long term (current) drug therapy: Secondary | ICD-10-CM | POA: Diagnosis not present

## 2014-06-24 DIAGNOSIS — Z794 Long term (current) use of insulin: Secondary | ICD-10-CM | POA: Diagnosis not present

## 2014-06-24 DIAGNOSIS — E1165 Type 2 diabetes mellitus with hyperglycemia: Secondary | ICD-10-CM | POA: Diagnosis present

## 2014-06-24 DIAGNOSIS — E1122 Type 2 diabetes mellitus with diabetic chronic kidney disease: Secondary | ICD-10-CM | POA: Diagnosis present

## 2014-06-24 LAB — COMPREHENSIVE METABOLIC PANEL
ALK PHOS: 65 U/L (ref 39–117)
ALT: 18 U/L (ref 0–53)
AST: 23 U/L (ref 0–37)
Albumin: 3.5 g/dL (ref 3.5–5.2)
Anion gap: 14 (ref 5–15)
BUN: 37 mg/dL — ABNORMAL HIGH (ref 6–23)
CO2: 28 mEq/L (ref 19–32)
Calcium: 9.3 mg/dL (ref 8.4–10.5)
Chloride: 100 mEq/L (ref 96–112)
Creatinine, Ser: 1.94 mg/dL — ABNORMAL HIGH (ref 0.50–1.35)
GFR calc Af Amer: 36 mL/min — ABNORMAL LOW (ref >90)
GFR calc non Af Amer: 31 mL/min — ABNORMAL LOW (ref >90)
GLUCOSE: 126 mg/dL — AB (ref 70–99)
POTASSIUM: 3.8 meq/L (ref 3.7–5.3)
SODIUM: 142 meq/L (ref 137–147)
TOTAL PROTEIN: 8.1 g/dL (ref 6.0–8.3)
Total Bilirubin: 0.7 mg/dL (ref 0.3–1.2)

## 2014-06-24 LAB — GLUCOSE, CAPILLARY
GLUCOSE-CAPILLARY: 95 mg/dL (ref 70–99)
Glucose-Capillary: 130 mg/dL — ABNORMAL HIGH (ref 70–99)

## 2014-06-24 LAB — CBC WITH DIFFERENTIAL/PLATELET
Basophils Absolute: 0 10*3/uL (ref 0.0–0.1)
Basophils Relative: 0 % (ref 0–1)
Eosinophils Absolute: 0 10*3/uL (ref 0.0–0.7)
Eosinophils Relative: 1 % (ref 0–5)
HCT: 38.8 % — ABNORMAL LOW (ref 39.0–52.0)
Hemoglobin: 12.6 g/dL — ABNORMAL LOW (ref 13.0–17.0)
LYMPHS ABS: 0.9 10*3/uL (ref 0.7–4.0)
Lymphocytes Relative: 18 % (ref 12–46)
MCH: 27.9 pg (ref 26.0–34.0)
MCHC: 32.5 g/dL (ref 30.0–36.0)
MCV: 86 fL (ref 78.0–100.0)
Monocytes Absolute: 0.5 10*3/uL (ref 0.1–1.0)
Monocytes Relative: 10 % (ref 3–12)
NEUTROS PCT: 71 % (ref 43–77)
Neutro Abs: 3.5 10*3/uL (ref 1.7–7.7)
Platelets: 173 10*3/uL (ref 150–400)
RBC: 4.51 MIL/uL (ref 4.22–5.81)
RDW: 14.7 % (ref 11.5–15.5)
WBC: 5 10*3/uL (ref 4.0–10.5)

## 2014-06-24 LAB — URINALYSIS, ROUTINE W REFLEX MICROSCOPIC
BILIRUBIN URINE: NEGATIVE
GLUCOSE, UA: NEGATIVE mg/dL
Nitrite: NEGATIVE
PH: 6 (ref 5.0–8.0)
Protein, ur: NEGATIVE mg/dL
SPECIFIC GRAVITY, URINE: 1.01 (ref 1.005–1.030)
Urobilinogen, UA: 0.2 mg/dL (ref 0.0–1.0)

## 2014-06-24 LAB — URINE MICROSCOPIC-ADD ON

## 2014-06-24 LAB — HEMOGLOBIN A1C
HEMOGLOBIN A1C: 6.5 % — AB (ref <5.7)
MEAN PLASMA GLUCOSE: 140 mg/dL — AB (ref <117)

## 2014-06-24 MED ORDER — TERAZOSIN HCL 5 MG PO CAPS
10.0000 mg | ORAL_CAPSULE | Freq: Every day | ORAL | Status: DC
  Administered 2014-06-25: 10 mg via ORAL
  Filled 2014-06-24: qty 2

## 2014-06-24 MED ORDER — INSULIN DETEMIR 100 UNIT/ML ~~LOC~~ SOLN
25.0000 [IU] | Freq: Every day | SUBCUTANEOUS | Status: DC
  Administered 2014-06-25: 25 [IU] via SUBCUTANEOUS
  Filled 2014-06-24 (×2): qty 0.25

## 2014-06-24 MED ORDER — DEXTROSE 5 % IV SOLN
1.0000 g | Freq: Once | INTRAVENOUS | Status: AC
  Administered 2014-06-24: 1 g via INTRAVENOUS
  Filled 2014-06-24: qty 10

## 2014-06-24 MED ORDER — INSULIN ASPART 100 UNIT/ML ~~LOC~~ SOLN: 4.0000 [IU] | Freq: Three times a day (TID) | SUBCUTANEOUS | Status: DC

## 2014-06-24 MED ORDER — DILTIAZEM HCL ER COATED BEADS 300 MG PO CP24: 300.0000 mg | ORAL_CAPSULE | Freq: Every day | ORAL | Status: DC

## 2014-06-24 MED ORDER — HYDRALAZINE HCL 20 MG/ML IJ SOLN: 5.0000 mg | INTRAMUSCULAR | Status: DC | PRN

## 2014-06-24 MED ORDER — LEVOTHYROXINE SODIUM 125 MCG PO TABS
125.0000 ug | ORAL_TABLET | Freq: Every day | ORAL | Status: DC
  Administered 2014-06-25: 125 ug via ORAL
  Filled 2014-06-24 (×3): qty 1

## 2014-06-24 MED ORDER — SODIUM CHLORIDE 0.9 % IV BOLUS (SEPSIS)
500.0000 mL | Freq: Once | INTRAVENOUS | Status: AC
  Administered 2014-06-24: 500 mL via INTRAVENOUS

## 2014-06-24 MED ORDER — INSULIN DETEMIR 100 UNIT/ML ~~LOC~~ SOLN: 40.0000 [IU] | Freq: Every day | SUBCUTANEOUS | Status: DC

## 2014-06-24 MED ORDER — DEXTROSE 5 % IV SOLN
1.0000 g | INTRAVENOUS | Status: DC
  Administered 2014-06-25: 1 g via INTRAVENOUS
  Filled 2014-06-24 (×2): qty 10

## 2014-06-24 MED ORDER — INSULIN DETEMIR 100 UNIT/ML ~~LOC~~ SOLN: 20.0000 [IU] | Freq: Every day | SUBCUTANEOUS | Status: DC

## 2014-06-24 MED ORDER — HEPARIN SODIUM (PORCINE) 5000 UNIT/ML IJ SOLN
5000.0000 [IU] | Freq: Three times a day (TID) | INTRAMUSCULAR | Status: DC
  Administered 2014-06-24 – 2014-06-25 (×3): 5000 [IU] via SUBCUTANEOUS
  Filled 2014-06-24 (×3): qty 1

## 2014-06-24 MED ORDER — INSULIN ASPART 100 UNIT/ML ~~LOC~~ SOLN
0.0000 [IU] | Freq: Three times a day (TID) | SUBCUTANEOUS | Status: DC
  Administered 2014-06-24: 1 [IU] via SUBCUTANEOUS
  Administered 2014-06-25: 2 [IU] via SUBCUTANEOUS

## 2014-06-24 MED ORDER — FERROUS SULFATE 325 (65 FE) MG PO TABS
325.0000 mg | ORAL_TABLET | Freq: Every day | ORAL | Status: DC
  Administered 2014-06-24 – 2014-06-25 (×2): 325 mg via ORAL
  Filled 2014-06-24 (×2): qty 1

## 2014-06-24 MED ORDER — BIOTENE DRY MOUTH MT LIQD
15.0000 mL | Freq: Two times a day (BID) | OROMUCOSAL | Status: DC
  Administered 2014-06-24 – 2014-06-25 (×2): 15 mL via OROMUCOSAL

## 2014-06-24 MED ORDER — DILTIAZEM HCL ER COATED BEADS 180 MG PO CP24
300.0000 mg | ORAL_CAPSULE | Freq: Every day | ORAL | Status: DC
  Administered 2014-06-25: 300 mg via ORAL
  Filled 2014-06-24 (×2): qty 1

## 2014-06-24 MED ORDER — ALPRAZOLAM 1 MG PO TABS: 1.0000 mg | ORAL_TABLET | Freq: Every evening | ORAL | Status: DC | PRN

## 2014-06-24 MED ORDER — SODIUM CHLORIDE 0.9 % IV SOLN
INTRAVENOUS | Status: AC
  Administered 2014-06-24: 14:00:00 via INTRAVENOUS

## 2014-06-24 MED ORDER — SODIUM CHLORIDE 0.9 % IV SOLN
INTRAVENOUS | Status: AC
Start: 2014-06-24 — End: 2014-06-25

## 2014-06-24 NOTE — H&P (Addendum)
History and Physical    Garrett Bradley ZOX:[id number] DOB: February 06, 1934 DOA: 06/24/2014  Referring physician: Dr. Rosalia Hammers PCP: Colette Ribas, MD  Specialists: none   Chief Complaint: Constipation  HPI: Garrett Bradley is a 80 y.o. male has a past medical history significant for hypertension, diabetes mellitus, hypothyroidism, presents to the emergency room with the chief complaint of constipation. He says he's been having decreased bowel movements over the last week, he does have a bowel movement every day, it is soft, however he doesn't feel it's enough. He endorses poor by mouth intake over the last week, no nausea or vomiting but he just doesn't want to eat and feels weak. He endorses poor fluid intake as well. He denies any chest pain, shortness of breath. He denies any abdominal pain. He denies any fevers or chills. He denies any dysuria, however in the past week he has noticed that his urine is cloudy. In the emergency room, patient felt to have a urinary tract infection, vital signs were stable, his worsening renal failure and TRH was asked for admission.  Review of Systems: As per history of present illness, otherwise negative  Past Medical History  Diagnosis Date  . Hypertension   . Diabetes mellitus   . Hypothyroidism   . Depression   . Urinary problem   . Hypokalemia   . Enlarged prostate    Past Surgical History  Procedure Laterality Date  . Hernia repair    . Transurethral resection of prostate    . Transurethral resection of prostate  09/22/2011    Procedure: TRANSURETHRAL RESECTION OF THE PROSTATE (TURP);  Surgeon: Ky Barban;  Location: [facility];  Service: Urology;  Laterality: N/A;   Social History:  reports that he has never smoked. He has never used smokeless tobacco. He reports that he does not drink alcohol or use illicit drugs.  Allergies  Allergen Reactions  . Thiazide-Type Diuretics     Patient was just told not to take, not sure if an actual allergy     Family History  Problem Relation Age of Onset  . Hypertension Mother   . Hypertension Brother   . Cerebral aneurysm Mother     Prior to Admission medications   Medication Sig Start Date End Date Taking? Authorizing Provider  ALPRAZolam Prudy Feeler) 1 MG tablet Take 1 mg by mouth at bedtime as needed for sleep.  10/02/11  Yes Christiane Ha, MD  clobetasol ointment (TEMOVATE) 0.05 % Apply 1 application topically daily as needed. For legs 02/09/14  Yes Historical Provider, MD  diltiazem (CARDIZEM CD) 300 MG 24 hr capsule Take 300 mg by mouth daily.    Yes Historical Provider, MD  ferrous sulfate 325 (65 FE) MG tablet Take 325 mg by mouth daily.  10/17/11  Yes Vassie Loll, MD  fish oil-omega-3 fatty acids 1000 MG capsule Take 1 capsule (1 g total) by mouth 2 (two) times daily. 10/02/11  Yes Christiane Ha, MD  furosemide (LASIX) 20 MG tablet Take 40 mg by mouth 2 (two) times daily. 03/06/14  Yes Donnetta Hutching, MD  insulin detemir (LEVEMIR FLEXPEN) 100 UNIT/ML injection Inject 50 Units into the skin at bedtime.    Yes Historical Provider, MD  insulin lispro (HUMALOG KWIKPEN) 100 UNIT/ML injection Inject 5 Units into the skin 3 (three) times daily before meals.    Yes Historical Provider, MD  levothyroxine (SYNTHROID, LEVOTHROID) 125 MCG tablet Take 125 mcg by mouth daily before breakfast.   Yes Historical  Provider, MD  linagliptin (TRADJENTA) 5 MG TABS tablet Take 5 mg by mouth daily.   Yes Historical Provider, MD  potassium chloride SA (K-DUR,KLOR-CON) 20 MEQ tablet Take 20-40 mEq by mouth 2 (two) times daily. Takes two tablets in the morning and takes one in the afternoon 02/15/13  Yes Gwenyth BenderKaren M Black, NP  terazosin (HYTRIN) 10 MG capsule Take 10 mg by mouth daily. 01/29/14  Yes Historical Provider, MD  Travoprost, BAK Free, (TRAVATAN) 0.004 % SOLN ophthalmic solution Place 1 drop into both eyes at bedtime.   Yes Historical Provider, MD   Physical Exam: Filed Vitals:   06/24/14 0847 06/24/14  1118 06/24/14 1200 06/24/14 1220  BP:  129/75 130/84 129/74  Pulse:  70 65 70  Temp: 98 F (36.7 C)   98.6 F (37 C)  TempSrc: Rectal   Oral  Resp:  14 8 13   Height:      Weight:      SpO2:  98% 96% 98%     General:  No apparent distress  Eyes: no scleral icterus  ENT: moist oropharynx  Neck: supple, no JVD  Cardiovascular: regular rate without MRG; 2+ peripheral pulses  Respiratory: CTA biL, good air movement without wheezing, rhonchi or crackled  Abdomen: Obese, soft, nontender to palpation, bowel sounds positive  Neurologic: Nonfocal   Labs on Admission:  Basic Metabolic Panel:  Recent Labs Lab 06/24/14 0900  NA 142  K 3.8  CL 100  CO2 28  GLUCOSE 126*  BUN 37*  CREATININE 1.94*  CALCIUM 9.3   Liver Function Tests:  Recent Labs Lab 06/24/14 0900  AST 23  ALT 18  ALKPHOS 65  BILITOT 0.7  PROT 8.1  ALBUMIN 3.5   CBC:  Recent Labs Lab 06/24/14 0900  WBC 5.0  NEUTROABS 3.5  HGB 12.6*  HCT 38.8*  MCV 86.0  PLT 173   BNP (last 3 results)  Recent Labs  09/06/13 1420  PROBNP 216.4   Radiological Exams on Admission: Dg Chest 2 View  06/24/2014   CLINICAL DATA:  Constipation.  Weakness.  Acute renal failure.  EXAM: CHEST  2 VIEW  COMPARISON:  04/04/2014  FINDINGS: The heart size and mediastinal contours are stable. Both lungs are clear. The visualized skeletal structures are unremarkable.  IMPRESSION: No active disease.   Electronically Signed   By: Myles RosenthalJohn  Stahl M.D.   On: 06/24/2014 09:28    EKG: Independently reviewed.  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 Urine culture sent.  Partial SBO - abdominal film with partial SBO, will make NPO,  repeat XR in am Constipation - likely related to poor by mouth intake rather true constipation as he is having bowel movements every day. We'll obtain a plain abdominal film. Hypothyroidism - check TSH. Continue Synthroid. Insulin-dependent diabetes mellitus - poorly controlled, we'll obtain an A1c. - insulin to lower doses given NPO status Hypertension - continue home medications Acute on chronic renal failure - provide hydration, recheck renal function in the morning, this is likely due to poor by mouth intake.  Diet: Heart healthy/modified  Fluids: normal saline  DVT Prophylaxis:Heparin subcutaneous  Code Status:  full code   Family Communication:  discussed with patient   Disposition Plan:  observation   Time spent:  50   This note has been created with Education officer, environmental. Any transcriptional errors are unintentional.   Shyan Scalisi M. Elvera Lennox, MD [facility] Pager [phone]  If 7PM-7AM, please contact night-coverage [email or web address] Password [facility] 06/24/2014, 12:56 PM

## 2014-06-24 NOTE — Progress Notes (Signed)
Patient given soap suds enema - had large, loose BM in return.  Brown in color.

## 2014-06-24 NOTE — ED Notes (Signed)
Occult blood results were negative

## 2014-06-24 NOTE — ED Provider Notes (Signed)
CSN: [id number]     Arrival date & time 06/24/14  4098 History  This chart was scribed for Hilario Quarry, MD by [place] Peace, ED Scribe. The patient was seen in [facility]. The patient's care was started at 8:26 AM.    Chief Complaint  Patient presents with  . Constipation      Patient is a 80 y.o. male presenting with constipation. The history is provided by the patient. No language interpreter was used.  Constipation Time since last bowel movement:  1 day Stool description:  Small Associated symptoms: nausea   Associated symptoms: no abdominal pain, no dysuria, no fever, no urinary retention and no vomiting   HPI Comments: JUANJESUS PEPPERMAN is a 80 y.o. male who arrived to ED by EMS presents to the Emergency Department complaining of decreased bowel movements and constipation onset 1 week with noticed appetite change that has been ongoing over the past 2 weeks with nausea However, he denies any associated pain. Pt reports most recent bowel movement was yesterday but states all of his episodes occur daily but do not feel complete. He states he has been eating less to lose weight due to accumulating fluids in his legs and has lost 40 lbs in the last year. Pt is taking 3 different laxatives, including a stool softener and Fish Oil to help alleviate issues. Pt states he normally gets around by himself but has not been driving recently because he feels tired and fatigue. He denies fever, abdominal pain, vomiting or any urinary problems. Pt reports history of DM and hypertension. His PCP is Dr. Phillips Odor  Past Medical History  Diagnosis Date  . Hypertension   . Diabetes mellitus   . Hypothyroidism   . Depression   . Urinary problem   . Hypokalemia   . Enlarged prostate    Past Surgical History  Procedure Laterality Date  . Hernia repair    . Transurethral resection of prostate    . Transurethral resection of prostate  09/22/2011    Procedure: TRANSURETHRAL RESECTION OF THE PROSTATE  (TURP);  Surgeon: Ky Barban;  Location: [facility];  Service: Urology;  Laterality: N/A;   Family History  Problem Relation Age of Onset  . Hypertension Mother   . Hypertension Brother   . Cerebral aneurysm Mother    History  Substance Use Topics  . Smoking status: Never Smoker   . Smokeless tobacco: Never Used  . Alcohol Use: No    Review of Systems  Constitutional: Positive for activity change, appetite change and fatigue. Negative for fever.  Gastrointestinal: Positive for nausea and constipation. Negative for vomiting and abdominal pain.  Genitourinary: Negative for dysuria and difficulty urinating.      Allergies  Thiazide-type diuretics  Home Medications   Prior to Admission medications   Medication Sig Start Date End Date Taking? Authorizing Provider  ALPRAZolam Prudy Feeler) 1 MG tablet Take 1 mg by mouth at bedtime as needed for sleep.  10/02/11   Christiane Ha, MD  cephALEXin (KEFLEX) 500 MG capsule Take 1 capsule (500 mg total) by mouth 4 (four) times daily. 04/05/14   Joya Gaskins, MD  clobetasol ointment (TEMOVATE) 0.05 % Apply 1 application topically daily as needed. For legs 02/09/14   Historical Provider, MD  diltiazem (CARDIZEM CD) 300 MG 24 hr capsule Take 300 mg by mouth daily.     Historical Provider, MD  ferrous sulfate 325 (65 FE) MG tablet Take 325 mg by mouth daily.  10/17/11  Vassie Loll, MD  fish oil-omega-3 fatty acids 1000 MG capsule Take 1 capsule (1 g total) by mouth 2 (two) times daily. 10/02/11   Christiane Ha, MD  furosemide (LASIX) 20 MG tablet Take 40 mg by mouth 2 (two) times daily. 03/06/14   Donnetta Hutching, MD  insulin detemir (LEVEMIR FLEXPEN) 100 UNIT/ML injection Inject 60 Units into the skin at bedtime.     Historical Provider, MD  insulin lispro (HUMALOG KWIKPEN) 100 UNIT/ML injection Inject 12 Units into the skin 3 (three) times daily before meals.     Historical Provider, MD  levothyroxine (SYNTHROID, LEVOTHROID) 125 MCG  tablet Take 125 mcg by mouth daily before breakfast.    Historical Provider, MD  linagliptin (TRADJENTA) 5 MG TABS tablet Take 5 mg by mouth daily.    Historical Provider, MD  potassium chloride SA (K-DUR,KLOR-CON) 20 MEQ tablet Take 20-40 mEq by mouth 2 (two) times daily. Takes two tablets in the morning and takes one in the afternoon 02/15/13   Gwenyth Bender, NP  tamsulosin (FLOMAX) 0.4 MG CAPS Take 0.4 mg by mouth daily.    Historical Provider, MD  terazosin (HYTRIN) 10 MG capsule Take 10 mg by mouth daily. 01/29/14   Historical Provider, MD  Travoprost, BAK Free, (TRAVATAN) 0.004 % SOLN ophthalmic solution Place 1 drop into both eyes at bedtime.    Historical Provider, MD   BP 155/94  Pulse 96  Temp(Src) 98.9 F (37.2 C) (Oral)  Resp 20  Ht 6\' 4"  (1.93 m)  Wt 305 lb (138.347 kg)  BMI 37.14 kg/m2  SpO2 91%   Physical Exam  Nursing note and vitals reviewed. Constitutional: He is oriented to person, place, and time. He appears well-developed and well-nourished. No distress.  HENT:  Head: Normocephalic and atraumatic.  Right Ear: External ear normal.  Left Ear: External ear normal.  Nose: Nose normal.  Mouth/Throat: Oropharynx is clear and moist.  Eyes: Conjunctivae and EOM are normal. Pupils are equal, round, and reactive to light.  Neck: Neck supple. No tracheal deviation present.  Cardiovascular: Normal rate.   Pulmonary/Chest: Effort normal and breath sounds normal. No respiratory distress.  Abdominal: Soft. He exhibits no mass. There is no tenderness.  Genitourinary: Rectum normal.  Soft stools.   Musculoskeletal: Normal range of motion. He exhibits edema (LE's bilaterally).  Neurological: He is alert and oriented to person, place, and time.  Skin: Skin is warm and dry. There is erythema.  LE's show bilateral Edema and signs of chronic erythema.   Psychiatric: He has a normal mood and affect. His behavior is normal.    ED Course  Procedures (including critical care  time) DIAGNOSTIC STUDIES: Oxygen Saturation is 91% on room air, low by my interpretation.    COORDINATION OF CARE: 8:35 AM- Treatment plan was discussed with patient who verbalizes understanding and agrees.     Labs Review Labs Reviewed  CBC WITH DIFFERENTIAL - Abnormal; Notable for the following:    Hemoglobin 12.6 (*)    HCT 38.8 (*)    All other components within normal limits  COMPREHENSIVE METABOLIC PANEL - Abnormal; Notable for the following:    Glucose, Bld 126 (*)    BUN 37 (*)    Creatinine, Ser 1.94 (*)    GFR calc non Af Amer 31 (*)    GFR calc Af Amer 36 (*)    All other components within normal limits  URINALYSIS, ROUTINE W REFLEX MICROSCOPIC - Abnormal; Notable for the following:  APPearance CLOUDY (*)    Hgb urine dipstick SMALL (*)    Ketones, ur TRACE (*)    Leukocytes, UA LARGE (*)    All other components within normal limits  URINE MICROSCOPIC-ADD ON - Abnormal; Notable for the following:    Bacteria, UA FEW (*)    All other components within normal limits  OCCULT BLOOD X 1 CARD TO LAB, STOOL    Imaging Review No results found.   EKG Interpretation   Date/Time:  Sunday June 24 2014 08:48:54 EDT Ventricular Rate:  88 PR Interval:  198 QRS Duration: 168 QT Interval:  456 QTC Calculation: 552 R Axis:   -92 Text Interpretation:  Normal sinus rhythm Right bundle branch block Left  anterior fasicular block SINCE LAST TRACING HEART RATE HAS INCREASED  Confirmed by Shylie Polo MD, Yashica Sterbenz ([id number]) on 06/24/2014 11:36:32 AM     Medications  sodium chloride 0.9 % bolus 500 mL (500 mLs Intravenous New Bag/Given 06/24/14 0900)    MDM  80 y.o. Male who presents today with feeling contipated, nausea and decreased po intake for a week.  He has a uti treated here with rocephin, urine being cultured.  Patient with renal insufficiency with creatinine increased from first prior of 1.4 to today at 1.94.  Plan admission for hydration with ongoing renal function  evaluation and antibiotics.     I personally performed the services described in this documentation, which was scribed in my presence. The recorded information has been reviewed and considered.   Hilario Quarryanielle S Dawnetta Copenhaver, MD 06/24/14 [phone]

## 2014-06-24 NOTE — ED Notes (Addendum)
C/o constipation x 1 week, LBM yesterday, pt. States small and soft, reports taking laxatives this week,  reports no n/v, abd soft and nontender

## 2014-06-25 ENCOUNTER — Observation Stay (HOSPITAL_COMMUNITY): Payer: Medicare HMO

## 2014-06-25 LAB — GLUCOSE, CAPILLARY
Glucose-Capillary: 103 mg/dL — ABNORMAL HIGH (ref 70–99)
Glucose-Capillary: 165 mg/dL — ABNORMAL HIGH (ref 70–99)

## 2014-06-25 LAB — BASIC METABOLIC PANEL
ANION GAP: 11 (ref 5–15)
BUN: 26 mg/dL — ABNORMAL HIGH (ref 6–23)
CALCIUM: 8.8 mg/dL (ref 8.4–10.5)
CO2: 29 meq/L (ref 19–32)
Chloride: 103 mEq/L (ref 96–112)
Creatinine, Ser: 1.36 mg/dL — ABNORMAL HIGH (ref 0.50–1.35)
GFR calc non Af Amer: 48 mL/min — ABNORMAL LOW (ref >90)
GFR, EST AFRICAN AMERICAN: 56 mL/min — AB (ref >90)
Glucose, Bld: 90 mg/dL (ref 70–99)
Potassium: 3.6 mEq/L — ABNORMAL LOW (ref 3.7–5.3)
SODIUM: 143 meq/L (ref 137–147)

## 2014-06-25 LAB — OCCULT BLOOD, POC DEVICE: Fecal Occult Bld: NEGATIVE

## 2014-06-25 MED ORDER — POLYETHYLENE GLYCOL 3350 17 G PO PACK: 17.0000 g | PACK | Freq: Every day | ORAL | Status: DC

## 2014-06-25 MED ORDER — CIPROFLOXACIN HCL 500 MG PO TABS: 500.0000 mg | ORAL_TABLET | Freq: Two times a day (BID) | ORAL | Status: DC

## 2014-06-25 NOTE — Discharge Instructions (Signed)

## 2014-06-25 NOTE — Plan of Care (Signed)
Problem: Discharge Progression Outcomes Goal: Other Discharge Outcomes/Goals Outcome: Completed/Met Date Met:  06/25/14 Discharged to home tolerating diet without nausea

## 2014-06-25 NOTE — Progress Notes (Signed)
Utilization review completed.  

## 2014-06-25 NOTE — Care Management Note (Signed)
    Page 1 of 1   06/25/2014     1:20:05 PM CARE MANAGEMENT NOTE 06/25/2014  Patient:  Garrett Bradley,Garrett Bradley   Account Number:  [id number]  Date Initiated:  06/25/2014  Documentation initiated by:  Sharrie RothmanBLACKWELL,Garrett Bradley  Subjective/Objective Assessment:   Pt admitted from home with UTI and constipation. Pt lives with his wife and will return home at discharge. Pt is independent with ADL's.     Action/Plan:   Pt given phone numbers for public transportation for [employer], [employer], and [employer] transportation. Pt for discharge today. No other CM needs noted.   Anticipated DC Date:  06/25/2014   Anticipated DC Plan:  HOME/SELF CARE      DC Planning Services  CM consult      Choice offered to / List presented to:             Status of service:  Completed, signed off Medicare Important Message given?   (If response is "NO", the following Medicare IM given date fields will be blank) Date Medicare IM given:   Medicare IM given by:   Date Additional Medicare IM given:   Additional Medicare IM given by:    Discharge Disposition:  HOME/SELF CARE  Per UR Regulation:    If discussed at Long Length of Stay Meetings, dates discussed:    Comments:  06/25/14 1320 Garrett Queenammy Nevaen Tredway, RN BSN CM

## 2014-06-25 NOTE — Clinical Social Work Note (Signed)
CSW received consult, per RN CM, patient has information on transportation resources and does not need SW consult.  CSW signing off as no SW needs identified.  Santa GeneraAnne Oriana Horiuchi, LCSW Clinical Social Worker [phone])

## 2014-06-25 NOTE — Discharge Summary (Signed)
Physician Discharge Summary  Garrett Bradley RUE:[id number] DOB: 05/01/1934 DOA: 06/24/2014  PCP: Colette RibasGOLDING, [name], MD  Admit date: 06/24/2014 Discharge date: 06/25/2014  Time spent: 35 minutes  Recommendations for Outpatient Follow-up:  1. 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 male has a past medical history significant for hypertension, diabetes mellitus, hypothyroidism, presents to the emergency room with the chief complaint of constipation. He says he's been having decreased bowel movements over the last week, he does have a bowel movement every day, it is soft, however he doesn't feel it's enough. He endorses poor by mouth intake over the last week, no nausea or vomiting but he just doesn't want to eat and feels weak. He endorses poor fluid intake as well. He denies any chest pain, shortness of breath. He denies any abdominal pain. He denies any fevers or chills. He denies any dysuria, however in the past week he has noticed that his urine is cloudy. In the emergency room, patient felt to have a urinary tract infection, vital signs were stable, his worsening renal failure and TRH was asked for admission.  Hospital Course:  The patient was admitted with a urinary tract infection and started on empiric ceftriaxone. Due to these abdominal complaints, he underwent an abdominal film which showed partial SBO and significant  stool burden. He was made n.p.o., and he had an enema with excellent results. Following that, his abdominal pain has significantly improved, and a repeat abdominal film the next morning showed complete resolution of his partial SBO with normal bowel gas pattern. Clinically he felt significantly improved and wished to go home, able to tolerate a diet without any nausea or vomiting. He was discharged home in stable condition, and will complete a course of ciprofloxacin for his urinary tract infection. At the time of discharge, urine cultures are pending and he will followup with his PCP for final speciation.   Procedures:  None    Consultations:  None   Discharge Exam: Filed Vitals:   06/24/14 1447 06/24/14 1613 06/24/14 2159 06/25/14 0707  BP: 180/81 160/88 156/75 139/70  Pulse: 75 68 55 64  Temp: 98.5 F (36.9 C)  98.4 F (36.9 C) 98.2 F (36.8 C)  TempSrc: Oral  Oral Oral  Resp: 16 18 18    Height: 6\' 4"  (1.93 m) 6\' 4"  (1.93 m)    Weight: 138.347 kg (305 lb) 138 kg (304 lb 3.8 oz)  131.498 kg (289 lb 14.4 oz)  SpO2: 93% 99% 98% 91%    General: NAD Cardiovascular: RRR Respiratory: CTA biL  Discharge Instructions     Medication List         ALPRAZolam 1 MG tablet  Commonly known as:  XANAX  Take 1 mg by mouth at bedtime as needed for sleep.     ciprofloxacin 500 MG tablet  Commonly known as:  CIPRO  Take 1 tablet (500 mg total) by mouth 2 (two) times daily.     clobetasol ointment 0.05 %  Commonly known as:  TEMOVATE  Apply 1 application topically daily as needed. For legs     diltiazem 300 MG 24 hr capsule  Commonly known as:  CARDIZEM CD  Take 300 mg by mouth daily.     ferrous sulfate 325 (65 FE) MG tablet  Take 325 mg by mouth daily.     fish oil-omega-3 fatty acids 1000 MG capsule  Take 1 capsule (1 g total) by mouth 2 (two) times daily.     furosemide 20 MG tablet  Commonly known as:  LASIX  Take 40 mg by mouth 2 (two) times daily.     HUMALOG  KWIKPEN 100 UNIT/ML injection  Generic drug:  insulin lispro  Inject 5 Units into the skin 3 (three) times daily before meals.     LEVEMIR FLEXPEN 100 UNIT/ML injection  Generic drug:  insulin detemir  Inject 50 Units into the skin at bedtime.     levothyroxine 125 MCG tablet  Commonly known as:  SYNTHROID, LEVOTHROID  Take 125 mcg by mouth daily before breakfast.     polyethylene glycol packet  Commonly known as:  MIRALAX / GLYCOLAX  Take 17 g by mouth daily.     potassium chloride SA 20 MEQ tablet  Commonly known as:  K-DUR,KLOR-CON  Take 20-40 mEq by mouth 2 (two) times daily. Takes two tablets in the morning and takes one in the afternoon     terazosin 10 MG capsule  Commonly known as:  HYTRIN  Take 10 mg by mouth daily.     TRADJENTA 5 MG Tabs tablet  Generic drug:  linagliptin  Take 5 mg by mouth daily.     Travoprost (BAK Free) 0.004 % Soln ophthalmic solution  Commonly known as:  TRAVATAN  Place 1 drop into both eyes at bedtime.           Follow-up Information   Follow up with Colette Ribas, MD. Schedule an appointment as soon as possible for a visit in 1 week.   Specialty:  Family Medicine   Contact information:   [address] [place] [address] [phone]       The results of significant diagnostics from this hospitalization (including imaging, microbiology, ancillary and laboratory) are listed below for reference.    Significant Diagnostic Studies: Dg Chest 2 View  06/24/2014   CLINICAL DATA:  Constipation.  Weakness.  Acute renal failure.  EXAM: CHEST  2 VIEW  COMPARISON:  04/04/2014  FINDINGS: The heart size and mediastinal contours are stable. Both lungs are clear. The visualized skeletal structures are unremarkable.  IMPRESSION: No active disease.   Electronically Signed   By: Myles Rosenthal M.D.   On: 06/24/2014 09:28   Dg Abd 2 Views  06/25/2014   CLINICAL DATA:  Partial small bowel obstruction  EXAM: ABDOMEN - 2 VIEW  COMPARISON:   06/24/2014  FINDINGS: Small bowel gas pattern is unremarkable today. There is a moderate amount of gas in the colon without a pathologic appearance. Multiple calculi main evident with and the horseshoe kidney, more on the left than the right. Ordinary degenerative changes affect the spine.  IMPRESSION: Normalization of the bowel gas pattern. No suggestion of small bowel obstruction today.   Electronically Signed   By: Paulina Fusi M.D.   On: 06/25/2014 09:19   Dg Abd 2 Views  06/24/2014  CLINICAL DATA:  Abdominal distention.  Constipation.  EXAM: ABDOMEN - 2 VIEW  COMPARISON:  Acute abdomen series 06/19/2013, 09/28/2011. One-view abdomen x-ray 06/10/2013. CT abdomen and pelvis 09/16/2011.  FINDINGS: Borderline to mild gaseous distention of multiple loops of small bowel in the left upper quadrant demonstrating air-fluid levels on the erect image. Expected stool burden. Air- fluid levels in the colon related to liquid stool. No free intraperitoneal air.  Prior CT demonstrated a horseshoe kidney. Numerous calculi projected over the expected location of the left side of the horseshoe kidney. Phleboliths in the pelvis. No visible ureteral calculi. Degenerative changes involving the lumbar spine.  IMPRESSION: 1. Early partial small bowel obstruction suspected as there are dilated loops of jejunum in the left upper quadrant demonstrating air-fluid levels on the erect image. 2. No free intraperitoneal air. 3. Urinary tract calculi within the left side of the previously identified horseshoe kidney.   Electronically Signed   By: Hulan Saas M.D.   On: 06/24/2014 13:47    Microbiology: Recent Results (from the past 240 hour(s))  URINE CULTURE     Status: None   Collection Time    06/24/14 10:10 AM      Result Value Ref Range Status   Specimen Description URINE, CLEAN CATCH   Final   Special Requests NONE   Final   Culture  Setup Time     Final   Value: 06/24/2014 19:50     Performed at [facility] Count     Final   Value: >=100,000 COLONIES/ML     Performed at [facility]   Culture     Final   Value: GRAM NEGATIVE RODS     Performed at [facility]   Report Status PENDING   Incomplete     Labs: Basic Metabolic Panel:  Recent Labs Lab 06/24/14 0900 06/25/14 0639  NA 142 143  K 3.8 3.6*  CL 100 103  CO2 28 29  GLUCOSE 126* 90  BUN 37* 26*  CREATININE 1.94* 1.36*  CALCIUM 9.3 8.8   Liver Function Tests:  Recent Labs Lab 06/24/14 0900  AST 23  ALT 18  ALKPHOS 65  BILITOT 0.7  PROT 8.1  ALBUMIN 3.5   CBC:  Recent Labs Lab 06/24/14 0900  WBC 5.0  NEUTROABS 3.5  HGB 12.6*  HCT 38.8*  MCV 86.0  PLT 173   BNP: BNP (last 3 results)  Recent Labs  09/06/13 1420  PROBNP 216.4   CBG:  Recent Labs Lab 06/24/14 1644 06/24/14 2033 06/25/14 0813 06/25/14 1229  GLUCAP 130* 95 103* 165*   Signed:  Mykaylah Ballman  [facility] 06/25/2014, 2:53 PM

## 2014-06-27 LAB — URINE CULTURE: Colony Count: >=100000

## 2014-06-28 LAB — TSH: TSH: 4.05 u[IU]/mL (ref 0.350–4.500)

## 2014-07-04 ENCOUNTER — Encounter (HOSPITAL_COMMUNITY): Payer: Self-pay | Admitting: Emergency Medicine

## 2014-07-04 ENCOUNTER — Other Ambulatory Visit: Payer: Self-pay

## 2014-07-04 ENCOUNTER — Emergency Department (HOSPITAL_COMMUNITY)
Admission: EM | Admit: 2014-07-04 | Discharge: 2014-07-05 | Disposition: A | Discharge status: Home / Self Care | Payer: Medicare HMO | Attending: Emergency Medicine | Admitting: Emergency Medicine

## 2014-07-04 DIAGNOSIS — Z79899 Other long term (current) drug therapy: Secondary | ICD-10-CM | POA: Diagnosis not present

## 2014-07-04 DIAGNOSIS — R5381 Other malaise: Secondary | ICD-10-CM | POA: Insufficient documentation

## 2014-07-04 DIAGNOSIS — Z794 Long term (current) use of insulin: Secondary | ICD-10-CM | POA: Diagnosis not present

## 2014-07-04 DIAGNOSIS — I10 Essential (primary) hypertension: Secondary | ICD-10-CM | POA: Diagnosis not present

## 2014-07-04 DIAGNOSIS — R531 Weakness: Secondary | ICD-10-CM

## 2014-07-04 DIAGNOSIS — R5383 Other fatigue: Secondary | ICD-10-CM | POA: Diagnosis not present

## 2014-07-04 DIAGNOSIS — E119 Type 2 diabetes mellitus without complications: Secondary | ICD-10-CM | POA: Diagnosis not present

## 2014-07-04 DIAGNOSIS — Z87448 Personal history of other diseases of urinary system: Secondary | ICD-10-CM | POA: Diagnosis not present

## 2014-07-04 DIAGNOSIS — E039 Hypothyroidism, unspecified: Secondary | ICD-10-CM | POA: Diagnosis not present

## 2014-07-04 LAB — CBG MONITORING, ED: GLUCOSE-CAPILLARY: 86 mg/dL (ref 70–99)

## 2014-07-04 NOTE — ED Provider Notes (Signed)
TIME SEEN: 11:25 PM  CHIEF COMPLAINT: Weakness  HPI: Patient is a 80 year old male with history of hypertension, insulin-dependent diabetes, hypothyroidism who presents to the emergency department with generalized weakness. He states that he took his insulin at 9 PM tonight but did not eat. He states that he began feeling temperature was dropping. When he checked it it was 120. When EMS checked it it was 110. Here the emergency department is 34. He says otherwise he is feeling well. Denies any headaches, fevers or chills, cough, chest pain or shortness of breath, vomiting or diarrhea. When asked why he is not eating, patient reports is because he has chronic constipation. Denies any abdominal pain. States he is passing gas. He is unsure when his last bowel movement was. He was recently treated for UTI with Cipro, finished his last dose yesterday. Denies any numbness, tingling or focal weakness. Not on anticoagulation.  ROS: See HPI Constitutional: no fever  Eyes: no drainage  ENT: no runny nose   Cardiovascular:  no chest pain  Resp: no SOB  GI: no vomiting GU: no dysuria Integumentary: no rash  Allergy: no hives  Musculoskeletal: no leg swelling  Neurological: no slurred speech ROS otherwise negative  PAST MEDICAL HISTORY/PAST SURGICAL HISTORY:  Past Medical History  Diagnosis Date  . Hypertension   . Diabetes mellitus   . Hypothyroidism   . Depression   . Urinary problem   . Hypokalemia   . Enlarged prostate     MEDICATIONS:  Prior to Admission medications   Medication Sig Start Date End Date Taking? Authorizing Provider  ALPRAZolam Prudy Feeler) 1 MG tablet Take 1 mg by mouth at bedtime as needed for sleep.  10/02/11   Christiane Ha, MD  ciprofloxacin (CIPRO) 500 MG tablet Take 1 tablet (500 mg total) by mouth 2 (two) times daily. 06/25/14   Costin Otelia Sergeant, MD  clobetasol ointment (TEMOVATE) 0.05 % Apply 1 application topically daily as needed. For legs 02/09/14   Historical  Provider, MD  diltiazem (CARDIZEM CD) 300 MG 24 hr capsule Take 300 mg by mouth daily.     Historical Provider, MD  ferrous sulfate 325 (65 FE) MG tablet Take 325 mg by mouth daily.  10/17/11   Vassie Loll, MD  fish oil-omega-3 fatty acids 1000 MG capsule Take 1 capsule (1 g total) by mouth 2 (two) times daily. 10/02/11   Christiane Ha, MD  furosemide (LASIX) 20 MG tablet Take 40 mg by mouth 2 (two) times daily. 03/06/14   Donnetta Hutching, MD  insulin detemir (LEVEMIR FLEXPEN) 100 UNIT/ML injection Inject 50 Units into the skin at bedtime.     Historical Provider, MD  insulin lispro (HUMALOG KWIKPEN) 100 UNIT/ML injection Inject 5 Units into the skin 3 (three) times daily before meals.     Historical Provider, MD  levothyroxine (SYNTHROID, LEVOTHROID) 125 MCG tablet Take 125 mcg by mouth daily before breakfast.    Historical Provider, MD  linagliptin (TRADJENTA) 5 MG TABS tablet Take 5 mg by mouth daily.    Historical Provider, MD  polyethylene glycol (MIRALAX / GLYCOLAX) packet Take 17 g by mouth daily. 06/25/14   Costin Otelia Sergeant, MD  potassium chloride SA (K-DUR,KLOR-CON) 20 MEQ tablet Take 20-40 mEq by mouth 2 (two) times daily. Takes two tablets in the morning and takes one in the afternoon 02/15/13   Gwenyth Bender, NP  terazosin (HYTRIN) 10 MG capsule Take 10 mg by mouth daily. 01/29/14   Historical Provider, MD  Travoprost, BAK Free, (TRAVATAN) 0.004 % SOLN ophthalmic solution Place 1 drop into both eyes at bedtime.    Historical Provider, MD    ALLERGIES:  Allergies  Allergen Reactions  . Thiazide-Type Diuretics     Patient was just told not to take, not sure if an actual allergy    SOCIAL HISTORY:  History  Substance Use Topics  . Smoking status: Never Smoker   . Smokeless tobacco: Never Used  . Alcohol Use: No    FAMILY HISTORY: Family History  Problem Relation Age of Onset  . Hypertension Mother   . Hypertension Brother   . Cerebral aneurysm Mother     EXAM: BP 137/78   Pulse 79  Temp(Src) 98.4 F (36.9 C) (Oral)  Resp 20  Ht 6\' 4"  (1.93 m)  Wt 294 lb (133.358 kg)  BMI 35.80 kg/m2  SpO2 95% CONSTITUTIONAL: Alert and oriented and responds appropriately to questions. Well-appearing; well-nourished HEAD: Normocephalic EYES: Conjunctivae clear, PERRL ENT: normal nose; no rhinorrhea; moist mucous membranes; pharynx without lesions noted NECK: Supple, no meningismus, no LAD  CARD: RRR; S1 and S2 appreciated; no murmurs, no clicks, no rubs, no gallops RESP: Normal chest excursion without splinting or tachypnea; breath sounds clear and equal bilaterally; no wheezes, no rhonchi, no rales,  ABD/GI: Normal bowel sounds; non-distended; soft, non-tender, no rebound, no guarding BACK:  The back appears normal and is non-tender to palpation, there is no CVA tenderness EXT: Normal ROM in all joints; non-tender to palpation; no edema; normal capillary refill; no cyanosis    SKIN: Normal color for age and race; warm NEURO: Moves all extremities equally, sensation to light touch intact diffusely, cranial nerves 2-12 intact PSYCH: The patient's mood and manner are appropriate. Grooming and personal hygiene are appropriate.  MEDICAL DECISION MAKING: Patient here with generalized weakness. Likely secondary to decreased by mouth intake and dropping serum glucose. We'll by mouth challenge. We'll obtain basic labs and urine to rule out any other organic cause for his weakness.  ED PROGRESS: Patient's labs are unremarkable. He has mild elevation of his creatinine which is chronic. Urine shows no sign of infection. His blood glucose is now improved from 86 to 113 and then up again to 116. He's been able to tolerate by mouth. He states he is feeling much better and ready for discharge home. No current complaints. Discussed strict return precautions and supportive care instructions. Patient verbalizes understanding and is comfortable with this plan. Family at bedside is also  comfortable with plan.      Date: 07/04/2014 23:09  Rate: 76  Rhythm: normal sinus rhythm  QRS Axis: normal  Intervals: RBBB, LAFB  ST/T Wave abnormalities: normal  Conduction Disutrbances: none  Narrative Interpretation: Right bundle branch block, left anterior fascicular block, PACs, no change compared to prior      Enbridge EnergyKristen N Pavel Gadd, DO 07/05/14 0209

## 2014-07-04 NOTE — ED Notes (Signed)
Pt took insulin, but didn't eat enough, currently feeling weak.  Currently being treated for UTI, Ciprofloxacin.

## 2014-07-04 NOTE — ED Notes (Signed)
Patient states "I feel weak. I took my sugar at home and it was 125, then I took my insulin and I haven't been eating much. I don't have an appetite." Gave patient orange juice and peanut butter and crackers.

## 2014-07-04 NOTE — ED Notes (Signed)
Pt was given to two 8 oz of orange juice and graham cracker with peanut butter for consumption.

## 2014-07-04 NOTE — ED Notes (Signed)
Pt is having weakness, d/t not eating enough and taking insulin medicines, last dosage around 2100 hrs.

## 2014-07-05 LAB — CBC WITH DIFFERENTIAL/PLATELET
BASOS ABS: 0 10*3/uL (ref 0.0–0.1)
Basophils Relative: 0 % (ref 0–1)
EOS PCT: 1 % (ref 0–5)
Eosinophils Absolute: 0.1 10*3/uL (ref 0.0–0.7)
HCT: 40.1 % (ref 39.0–52.0)
HEMOGLOBIN: 13.3 g/dL (ref 13.0–17.0)
LYMPHS ABS: 1.3 10*3/uL (ref 0.7–4.0)
LYMPHS PCT: 19 % (ref 12–46)
MCH: 28.8 pg (ref 26.0–34.0)
MCHC: 33.2 g/dL (ref 30.0–36.0)
MCV: 86.8 fL (ref 78.0–100.0)
MONOS PCT: 12 % (ref 3–12)
Monocytes Absolute: 0.8 10*3/uL (ref 0.1–1.0)
NEUTROS ABS: 4.4 10*3/uL (ref 1.7–7.7)
Neutrophils Relative %: 68 % (ref 43–77)
Platelets: 165 10*3/uL (ref 150–400)
RBC: 4.62 MIL/uL (ref 4.22–5.81)
RDW: 14.7 % (ref 11.5–15.5)
WBC: 6.5 10*3/uL (ref 4.0–10.5)

## 2014-07-05 LAB — TROPONIN I: Troponin I: <0.3 ng/mL (ref <0.30)

## 2014-07-05 LAB — URINALYSIS, ROUTINE W REFLEX MICROSCOPIC
BILIRUBIN URINE: NEGATIVE
GLUCOSE, UA: NEGATIVE mg/dL
Hgb urine dipstick: NEGATIVE
Ketones, ur: NEGATIVE mg/dL
Leukocytes, UA: NEGATIVE
Nitrite: NEGATIVE
PH: 5.5 (ref 5.0–8.0)
Protein, ur: NEGATIVE mg/dL
SPECIFIC GRAVITY, URINE: 1.01 (ref 1.005–1.030)
Urobilinogen, UA: 0.2 mg/dL (ref 0.0–1.0)

## 2014-07-05 LAB — CBG MONITORING, ED
GLUCOSE-CAPILLARY: 113 mg/dL — AB (ref 70–99)
GLUCOSE-CAPILLARY: 116 mg/dL — AB (ref 70–99)

## 2014-07-05 LAB — BASIC METABOLIC PANEL
ANION GAP: 13 (ref 5–15)
BUN: 29 mg/dL — AB (ref 6–23)
CALCIUM: 9.2 mg/dL (ref 8.4–10.5)
CHLORIDE: 100 meq/L (ref 96–112)
CO2: 29 meq/L (ref 19–32)
CREATININE: 1.61 mg/dL — AB (ref 0.50–1.35)
GFR calc Af Amer: 46 mL/min — ABNORMAL LOW (ref >90)
GFR calc non Af Amer: 39 mL/min — ABNORMAL LOW (ref >90)
GLUCOSE: 110 mg/dL — AB (ref 70–99)
Potassium: 3.5 mEq/L — ABNORMAL LOW (ref 3.7–5.3)
Sodium: 142 mEq/L (ref 137–147)

## 2014-07-05 NOTE — Discharge Instructions (Signed)
Weakness °Weakness is a lack of strength. It may be felt all over the body (generalized) or in one specific part of the body (focal). Some causes of weakness can be serious. You may need further medical evaluation, especially if you are elderly or you have a history of immunosuppression (such as chemotherapy or HIV), kidney disease, heart disease, or diabetes. °CAUSES  °Weakness can be caused by many different things, including: °· Infection. °· Physical exhaustion. °· Internal bleeding or other blood loss that results in a lack of red blood cells (anemia). °· Dehydration. This cause is more common in elderly people. °· Side effects or electrolyte abnormalities from medicines, such as pain medicines or sedatives. °· Emotional distress, anxiety, or depression. °· Circulation problems, especially severe peripheral arterial disease. °· Heart disease, such as rapid atrial fibrillation, bradycardia, or heart failure. °· Nervous system disorders, such as Guillain-Barré syndrome, multiple sclerosis, or stroke. °DIAGNOSIS  °To find the cause of your weakness, your caregiver will take your history and perform a physical exam. Lab tests or X-rays may also be ordered, if needed. °TREATMENT  °Treatment of weakness depends on the cause of your symptoms and can vary greatly. °HOME CARE INSTRUCTIONS  °· Rest as needed. °· Eat a well-balanced diet. °· Try to get some exercise every day. °· Only take over-the-counter or prescription medicines as directed by your caregiver. °SEEK MEDICAL CARE IF:  °· Your weakness seems to be getting worse or spreads to other parts of your body. °· You develop new aches or pains. °SEEK IMMEDIATE MEDICAL CARE IF:  °· You cannot perform your normal daily activities, such as getting dressed and feeding yourself. °· You cannot walk up and down stairs, or you feel exhausted when you do so. °· You have shortness of breath or chest pain. °· You have difficulty moving parts of your body. °· You have weakness  in only one area of the body or on only one side of the body. °· You have a fever. °· You have trouble speaking or swallowing. °· You cannot control your bladder or bowel movements. °· You have black or bloody vomit or stools. °MAKE SURE YOU: °· Understand these instructions. °· Will watch your condition. °· Will get help right away if you are not doing well or get worse. °Document Released: 11/23/2005 Document Revised: 05/24/2012 Document Reviewed: 01/22/2012 °[employer]® Patient Information ©2015 [employer]. This information is not intended to replace advice given to you by your health care provider. Make sure you discuss any questions you have with your health care provider. ° °Hypoglycemia °Hypoglycemia occurs when the glucose in your blood is too low. Glucose is a type of sugar that is your body's main energy source. Hormones, such as insulin and glucagon, control the level of glucose in the blood. Insulin lowers blood glucose and glucagon increases blood glucose. Having too much insulin in your blood stream, or not eating enough food containing sugar, can result in hypoglycemia. Hypoglycemia can happen to people with or without diabetes. It can develop quickly and can be a medical emergency.  °CAUSES  °· Missing or delaying meals. °· Not eating enough carbohydrates at meals. °· Taking too much diabetes medicine. °· Not timing your oral diabetes medicine or insulin doses with meals, snacks, and exercise. °· Nausea and vomiting. °· Certain medicines. °· Severe illnesses, such as hepatitis, kidney disorders, and certain eating disorders. °· Increased activity or exercise without eating something extra or adjusting medicines. °· Drinking too much alcohol. °· A   nerve disorder that affects body functions like your heart rate, blood pressure, and digestion (autonomic neuropathy). °· A condition where the stomach muscles do not function properly (gastroparesis). Therefore, medicines and food may not absorb  properly. °· Rarely, a tumor of the pancreas can produce too much insulin. °SYMPTOMS  °· Hunger. °· Sweating (diaphoresis). °· Change in body temperature. °· Shakiness. °· Headache. °· Anxiety. °· Lightheadedness. °· Irritability. °· Difficulty concentrating. °· Dry mouth. °· Tingling or numbness in the hands or feet. °· Restless sleep or sleep disturbances. °· Altered speech and coordination. °· Change in mental status. °· Seizures or prolonged convulsions. °· Combativeness. °· Drowsiness (lethargic). °· Weakness. °· Increased heart rate or palpitations. °· Confusion. °· Pale, gray skin color. °· Blurred or double vision. °· Fainting. °DIAGNOSIS  °A physical exam and medical history will be performed. Your caregiver may make a diagnosis based on your symptoms. Blood tests and other lab tests may be performed to confirm a diagnosis. Once the diagnosis is made, your caregiver will see if your signs and symptoms go away once your blood glucose is raised.  °TREATMENT  °Usually, you can easily treat your hypoglycemia when you notice symptoms. °· Check your blood glucose. If it is less than 70 mg/dl, take one of the following:   °¨ 3-4 glucose tablets.   °¨ ½ cup juice.   °¨ ½ cup regular soda.   °¨ 1 cup skim milk.   °¨ ½-1 tube of glucose gel.   °¨ 5-6 hard candies.   °· Avoid high-fat drinks or food that may delay a rise in blood glucose levels. °· Do not take more than the recommended amount of sugary foods, drinks, gel, or tablets. Doing so will cause your blood glucose to go too high.   °· Wait 10-15 minutes and recheck your blood glucose. If it is still less than 70 mg/dl or below your target range, repeat treatment.   °· Eat a snack if it is more than 1 hour until your next meal.   °There may be a time when your blood glucose may go so low that you are unable to treat yourself at home when you start to notice symptoms. You may need someone to help you. You may even faint or be unable to swallow. If you cannot  treat yourself, someone will need to bring you to the hospital.  °HOME CARE INSTRUCTIONS °· If you have diabetes, follow your diabetes management plan by: °¨ Taking your medicines as directed. °¨ Following your exercise plan. °¨ Following your meal plan. Do not skip meals. Eat on time. °¨ Testing your blood glucose regularly. Check your blood glucose before and after exercise. If you exercise longer or different than usual, be sure to check blood glucose more frequently. °¨ Wearing your medical alert jewelry that says you have diabetes. °· Identify the cause of your hypoglycemia. Then, develop ways to prevent the recurrence of hypoglycemia. °· Do not take a hot bath or shower right after an insulin shot. °· Always carry treatment with you. Glucose tablets are the easiest to carry. °· If you are going to drink alcohol, drink it only with meals. °· Tell friends or family members ways to keep you safe during a seizure. This may include removing hard or sharp objects from the area or turning you on your side. °· Maintain a healthy weight. °SEEK MEDICAL CARE IF:  °· You are having problems keeping your blood glucose in your target range. °· You are having frequent episodes of hypoglycemia. °· You feel you   might be having side effects from your medicines. °· You are not sure why your blood glucose is dropping so low. °· You notice a change in vision or a new problem with your vision. °SEEK IMMEDIATE MEDICAL CARE IF:  °· Confusion develops. °· A change in mental status occurs. °· The inability to swallow develops. °· Fainting occurs. °Document Released: 11/23/2005 Document Revised: 11/28/2013 Document Reviewed: 03/21/2012 °[employer]® Patient Information ©2015 [employer]. This information is not intended to replace advice given to you by your health care provider. Make sure you discuss any questions you have with your health care provider. ° °

## 2014-07-09 ENCOUNTER — Emergency Department (HOSPITAL_COMMUNITY): Payer: Medicare HMO

## 2014-07-09 ENCOUNTER — Encounter (HOSPITAL_COMMUNITY): Payer: Self-pay | Admitting: Emergency Medicine

## 2014-07-09 ENCOUNTER — Emergency Department (HOSPITAL_COMMUNITY)
Admission: EM | Admit: 2014-07-09 | Discharge: 2014-07-10 | Disposition: A | Discharge status: Home / Self Care | Payer: Medicare HMO | Attending: Emergency Medicine | Admitting: Emergency Medicine

## 2014-07-09 DIAGNOSIS — Z87440 Personal history of urinary (tract) infections: Secondary | ICD-10-CM | POA: Insufficient documentation

## 2014-07-09 DIAGNOSIS — R531 Weakness: Secondary | ICD-10-CM

## 2014-07-09 DIAGNOSIS — R5383 Other fatigue: Secondary | ICD-10-CM | POA: Diagnosis not present

## 2014-07-09 DIAGNOSIS — E119 Type 2 diabetes mellitus without complications: Secondary | ICD-10-CM | POA: Insufficient documentation

## 2014-07-09 DIAGNOSIS — N179 Acute kidney failure, unspecified: Secondary | ICD-10-CM | POA: Insufficient documentation

## 2014-07-09 DIAGNOSIS — R5381 Other malaise: Secondary | ICD-10-CM | POA: Diagnosis not present

## 2014-07-09 DIAGNOSIS — K5900 Constipation, unspecified: Secondary | ICD-10-CM | POA: Diagnosis present

## 2014-07-09 DIAGNOSIS — F3289 Other specified depressive episodes: Secondary | ICD-10-CM | POA: Diagnosis not present

## 2014-07-09 DIAGNOSIS — Z794 Long term (current) use of insulin: Secondary | ICD-10-CM | POA: Insufficient documentation

## 2014-07-09 DIAGNOSIS — N400 Enlarged prostate without lower urinary tract symptoms: Secondary | ICD-10-CM | POA: Insufficient documentation

## 2014-07-09 DIAGNOSIS — Z792 Long term (current) use of antibiotics: Secondary | ICD-10-CM | POA: Insufficient documentation

## 2014-07-09 DIAGNOSIS — E876 Hypokalemia: Secondary | ICD-10-CM | POA: Diagnosis not present

## 2014-07-09 DIAGNOSIS — I10 Essential (primary) hypertension: Secondary | ICD-10-CM | POA: Insufficient documentation

## 2014-07-09 DIAGNOSIS — F329 Major depressive disorder, single episode, unspecified: Secondary | ICD-10-CM | POA: Diagnosis not present

## 2014-07-09 DIAGNOSIS — Z79899 Other long term (current) drug therapy: Secondary | ICD-10-CM | POA: Insufficient documentation

## 2014-07-09 DIAGNOSIS — E039 Hypothyroidism, unspecified: Secondary | ICD-10-CM | POA: Insufficient documentation

## 2014-07-09 DIAGNOSIS — N399 Disorder of urinary system, unspecified: Secondary | ICD-10-CM | POA: Diagnosis not present

## 2014-07-09 LAB — CBC
HCT: 38.6 % — ABNORMAL LOW (ref 39.0–52.0)
HEMOGLOBIN: 12.7 g/dL — AB (ref 13.0–17.0)
MCH: 28.3 pg (ref 26.0–34.0)
MCHC: 32.9 g/dL (ref 30.0–36.0)
MCV: 86.2 fL (ref 78.0–100.0)
Platelets: 166 10*3/uL (ref 150–400)
RBC: 4.48 MIL/uL (ref 4.22–5.81)
RDW: 14.6 % (ref 11.5–15.5)
WBC: 6.3 10*3/uL (ref 4.0–10.5)

## 2014-07-09 LAB — BASIC METABOLIC PANEL
ANION GAP: 16 — AB (ref 5–15)
BUN: 22 mg/dL (ref 6–23)
CHLORIDE: 101 meq/L (ref 96–112)
CO2: 26 mEq/L (ref 19–32)
CREATININE: 1.32 mg/dL (ref 0.50–1.35)
Calcium: 9.2 mg/dL (ref 8.4–10.5)
GFR calc Af Amer: 58 mL/min — ABNORMAL LOW (ref >90)
GFR calc non Af Amer: 50 mL/min — ABNORMAL LOW (ref >90)
Glucose, Bld: 108 mg/dL — ABNORMAL HIGH (ref 70–99)
Potassium: 3.4 mEq/L — ABNORMAL LOW (ref 3.7–5.3)
SODIUM: 143 meq/L (ref 137–147)

## 2014-07-09 LAB — TROPONIN I

## 2014-07-09 LAB — POC OCCULT BLOOD, ED: Fecal Occult Bld: NEGATIVE

## 2014-07-09 LAB — CBG MONITORING, ED: GLUCOSE-CAPILLARY: 111 mg/dL — AB (ref 70–99)

## 2014-07-09 MED ORDER — SODIUM CHLORIDE 0.9 % IV BOLUS (SEPSIS)
1000.0000 mL | Freq: Once | INTRAVENOUS | Status: AC
  Administered 2014-07-09: 1000 mL via INTRAVENOUS

## 2014-07-09 NOTE — ED Notes (Signed)
Pt presents for evaluation of ongoing weakness- pt was seen on 7/29 at [facility] for same- pt states he still feels weak and has not been eating because his "stomach feels full".  Pt also reports constipation- last bowel movement 1 week ago, pt usually has one every 3 days.  Denies pain.

## 2014-07-09 NOTE — ED Provider Notes (Signed)
CSN: [id number]     Arrival date & time 07/09/14  1619 History   First MD Initiated Contact with Patient 07/09/14 2045     Chief Complaint  Patient presents with  . Constipation  . Weakness     (Consider location/radiation/quality/duration/timing/severity/associated sxs/prior Treatment) HPI Comments: Patient from home with "trouble with my bowels". States she had a bowel movement in the past 6 days. His been taking MiraLAX daily without results. He has similar visit on July 29 to the emergency room. Denies any abdominal pain. Denies any nausea or vomiting. Denies any chest pain or shortness of breath. She endorses poor appetite and not wanting to eat. He thinks he is depressed. He denies any suicidal or homicidal thoughts. His sugars have been well controlled. He was admitted to the hospital for acute renal failure and UTI at the end of July. He is not follow up with his doctor.  The history is provided by the patient and the spouse.    Past Medical History  Diagnosis Date  . Hypertension   . Diabetes mellitus   . Hypothyroidism   . Depression   . Urinary problem   . Hypokalemia   . Enlarged prostate    Past Surgical History  Procedure Laterality Date  . Hernia repair    . Transurethral resection of prostate    . Transurethral resection of prostate  09/22/2011    Procedure: TRANSURETHRAL RESECTION OF THE PROSTATE (TURP);  Surgeon: Ky BarbanMohammad I Javaid;  Location: [facility];  Service: Urology;  Laterality: N/A;   Family History  Problem Relation Age of Onset  . Hypertension Mother   . Hypertension Brother   . Cerebral aneurysm Mother    History  Substance Use Topics  . Smoking status: Never Smoker   . Smokeless tobacco: Never Used  . Alcohol Use: No    Review of Systems  Constitutional: Positive for activity change, appetite change and fatigue. Negative for fever.  HENT: Negative for congestion and rhinorrhea.   Eyes: Negative for visual disturbance.  Respiratory: Negative  for cough, chest tightness and shortness of breath.   Cardiovascular: Negative for chest pain.  Gastrointestinal: Positive for constipation. Negative for nausea, vomiting and abdominal pain.  Genitourinary: Negative for dysuria and hematuria.  Musculoskeletal: Negative for back pain and myalgias.  Skin: Negative for rash.  Neurological: Positive for weakness. Negative for dizziness, facial asymmetry and headaches.  A complete 10 system review of systems was obtained and all systems are negative except as noted in the HPI and PMH.      Allergies  Thiazide-type diuretics  Home Medications   Prior to Admission medications   Medication Sig Start Date End Date Taking? Authorizing Provider  ALPRAZolam Prudy Feeler(XANAX) 1 MG tablet Take 1 mg by mouth at bedtime as needed for sleep.  10/02/11  Yes Christiane Haorinna L Sullivan, MD  clobetasol ointment (TEMOVATE) 0.05 % Apply 1 application topically daily as needed. For legs 02/09/14  Yes Historical Provider, MD  diltiazem (CARDIZEM CD) 300 MG 24 hr capsule Take 300 mg by mouth daily.    Yes Historical Provider, MD  ferrous sulfate 325 (65 FE) MG tablet Take 325 mg by mouth daily.  10/17/11  Yes Vassie Lollarlos Madera, MD  fish oil-omega-3 fatty acids 1000 MG capsule Take 1 capsule (1 g total) by mouth 2 (two) times daily. 10/02/11  Yes Christiane Haorinna L Sullivan, MD  furosemide (LASIX) 20 MG tablet Take 40 mg by mouth 2 (two) times daily. 03/06/14  Yes Donnetta HutchingBrian Cook, MD  insulin detemir (LEVEMIR FLEXPEN) 100 UNIT/ML injection Inject 50 Units into the skin at bedtime.    Yes Historical Provider, MD  insulin lispro (HUMALOG KWIKPEN) 100 UNIT/ML injection Inject 5 Units into the skin 3 (three) times daily before meals.    Yes Historical Provider, MD  levothyroxine (SYNTHROID, LEVOTHROID) 125 MCG tablet Take 125 mcg by mouth daily before breakfast.   Yes Historical Provider, MD  metFORMIN (GLUCOPHAGE) 500 MG tablet Take 500 mg by mouth 2 (two) times daily with a meal.   Yes Historical  Provider, MD  polyethylene glycol (MIRALAX / GLYCOLAX) packet Take 17 g by mouth daily as needed for mild constipation. 06/25/14  Yes Costin Otelia Sergeant, MD  potassium chloride SA (K-DUR,KLOR-CON) 20 MEQ tablet Take 20 mEq by mouth 2 (two) times daily. Takes two tablets in the morning and takes one in the afternoon 02/15/13  Yes Gwenyth Bender, NP  terazosin (HYTRIN) 10 MG capsule Take 10 mg by mouth daily. 01/29/14  Yes Historical Provider, MD  Travoprost, BAK Free, (TRAVATAN) 0.004 % SOLN ophthalmic solution Place 1 drop into both eyes at bedtime.   Yes Historical Provider, MD  ciprofloxacin (CIPRO) 500 MG tablet Take 500 mg by mouth 2 (two) times daily. Completed course of medication on 07-03-14 06/25/14   Costin Otelia Sergeant, MD   BP 141/90  Pulse 64  Temp(Src) 98.7 F (37.1 C) (Oral)  Resp 16  Ht 6\' 4"  (1.93 m)  Wt 294 lb (133.358 kg)  BMI 35.80 kg/m2  SpO2 97% Physical Exam  Nursing note and vitals reviewed. Constitutional: He is oriented to person, place, and time. He appears well-developed and well-nourished. No distress.  Flat affect  HENT:  Head: Normocephalic and atraumatic.  Mouth/Throat: Oropharynx is clear and moist. No oropharyngeal exudate.  Eyes: Conjunctivae and EOM are normal. Pupils are equal, round, and reactive to light.  Neck: Normal range of motion. Neck supple.  No meningismus.  Cardiovascular: Normal rate, regular rhythm, normal heart sounds and intact distal pulses.   No murmur heard. Pulmonary/Chest: Effort normal and breath sounds normal. No respiratory distress.  Abdominal: Soft. There is no tenderness. There is no rebound and no guarding.  Genitourinary:  No stool impaction.  Musculoskeletal: Normal range of motion. He exhibits no edema and no tenderness.  Neurological: He is alert and oriented to person, place, and time. No cranial nerve deficit. He exhibits normal muscle tone. Coordination normal.  No ataxia on finger to nose bilaterally. No pronator drift. 5/5  strength throughout. CN 2-12 intact. Negative Romberg. Equal grip strength. Sensation intact. Gait is normal.   Skin: Skin is warm.  Psychiatric: He has a normal mood and affect. His behavior is normal.    ED Course  Procedures (including critical care time) Labs Review Labs Reviewed  BASIC METABOLIC PANEL - Abnormal; Notable for the following:    Potassium 3.4 (*)    Glucose, Bld 108 (*)    GFR calc non Af Amer 50 (*)    GFR calc Af Amer 58 (*)    Anion gap 16 (*)    All other components within normal limits  CBC - Abnormal; Notable for the following:    Hemoglobin 12.7 (*)    HCT 38.6 (*)    All other components within normal limits  URINALYSIS, ROUTINE W REFLEX MICROSCOPIC - Abnormal; Notable for the following:    Ketones, ur 15 (*)    All other components within normal limits  CBG MONITORING, ED - Abnormal; Notable  for the following:    Glucose-Capillary 111 (*)    All other components within normal limits  TROPONIN I  POC OCCULT BLOOD, ED    Imaging Review Ct Abdomen Pelvis Wo Contrast  07/10/2014   CLINICAL DATA:  Constipation in weakness  EXAM: CT ABDOMEN AND PELVIS WITHOUT CONTRAST  TECHNIQUE: Multidetector CT imaging of the abdomen and pelvis was performed following the standard protocol without IV contrast.  COMPARISON:  09/16/2011  FINDINGS: BODY WALL: Unremarkable.  LOWER CHEST: Unremarkable.  ABDOMEN/PELVIS:  Liver: No focal abnormality.  Biliary: Cholelithiasis.  No evidence of acute cholecystitis.  Pancreas: No acute findings. Suspect benign 15 mm cyst from the pancreatic tail, stable from 2012.  Spleen: Unremarkable.  Adrenals: Unremarkable.  Kidneys and ureters: Horseshoe kidney with bilateral nephrolithiasis. The largest stone is in the left kidney measuring 8 mm. Presumed 2.2 cm cyst in the region of the interpolar left kidney. 12 mm mildly dense lesion in the posterior right upper kidney, increased from 6 mm in 2012. Mild left hydroureteronephrosis. No ureteral  calculus.  Bladder: Dilated.  Reproductive: Prostate is not enlarged, but there is evidence of previous TURP.  Bowel: No obstruction. Negative appendix.  Retroperitoneum: No mass or adenopathy.  Peritoneum: No ascites or pneumoperitoneum.  Vascular: No acute abnormality.  OSSEOUS: No acute abnormalities.  IMPRESSION: 1. Dilated bladder, likely outlet obstruction in this patient with trans urethral resection of prostate. 2. Mild left hydroureteronephrosis. Ultrasound after bladder drainage could document resolution. 3. Horseshoe kidney with bilateral nephrolithiasis. 4. 12 mm lesion in the right kidney could reflect hemorrhagic cyst or solid mass. There has been minimal growth since 2012 and this finding could be followed with 12 month follow-up. 5. Cholelithiasis without acute cholecystitis.   Electronically Signed   By: Tiburcio Pea M.D.   On: 07/10/2014 00:52     EKG Interpretation None      MDM   Final diagnoses:  Weakness  Constipation, unspecified constipation type   Constipation with generalized weakness. Some depression but no suicidal thoughts. No chest pain or shortness of breath. Recent visit for same.  UA negative. Labs unremarkable and at baseline. Creatinine stable at 1.3.  Enlarged bladder on CT scan. Patient was able to urinate on his own about 500 cc. Bladder scan after this shows residual 12 mL. No need for Foley as patient voiding on own. UA negative. Patient and family informed of kidney cyst that needs follow up imaging in one year.  Does not appear overly constipated on CT.  No fecal impaction.  Instructed to continue miralax and follow up with PCP. Return precautions discussed.   Date: 07/09/2014  Rate: 82  Rhythm: normal sinus rhythm  QRS Axis: normal  Intervals: normal  ST/T Wave abnormalities: nonspecific ST/T changes  Conduction Disutrbances:right bundle branch block  Narrative Interpretation: PVCs and PACs  Old EKG Reviewed: unchanged    Glynn Octave, MD 07/10/14 1018

## 2014-07-09 NOTE — ED Notes (Signed)
Called lab about troponin, will call back if there is not enough blood to run a troponin.

## 2014-07-09 NOTE — ED Notes (Signed)
Pt. C/o constipation with decreased appetite and "full" feeling if he does eat. Denies pain.

## 2014-07-10 LAB — URINALYSIS, ROUTINE W REFLEX MICROSCOPIC
BILIRUBIN URINE: NEGATIVE
GLUCOSE, UA: NEGATIVE mg/dL
HGB URINE DIPSTICK: NEGATIVE
Ketones, ur: 15 mg/dL — AB
Leukocytes, UA: NEGATIVE
Nitrite: NEGATIVE
Protein, ur: NEGATIVE mg/dL
Specific Gravity, Urine: 1.014 (ref 1.005–1.030)
UROBILINOGEN UA: 0.2 mg/dL (ref 0.0–1.0)
pH: 5.5 (ref 5.0–8.0)

## 2014-07-10 NOTE — Discharge Instructions (Signed)
Weakness There is a cyst on your kidney that should be checked again in 1 year. Follow up with Dr. Phillips OdorGolding. Return to the ED if you develop new or worsening symptoms. Weakness is a lack of strength. It may be felt all over the body (generalized) or in one specific part of the body (focal). Some causes of weakness can be serious. You may need further medical evaluation, especially if you are elderly or you have a history of immunosuppression (such as chemotherapy or HIV), kidney disease, heart disease, or diabetes. CAUSES  Weakness can be caused by many different things, including:  Infection.  Physical exhaustion.  Internal bleeding or other blood loss that results in a lack of red blood cells (anemia).  Dehydration. This cause is more common in elderly people.  Side effects or electrolyte abnormalities from medicines, such as pain medicines or sedatives.  Emotional distress, anxiety, or depression.  Circulation problems, especially severe peripheral arterial disease.  Heart disease, such as rapid atrial fibrillation, bradycardia, or heart failure.  Nervous system disorders, such as Guillain-Barr syndrome, multiple sclerosis, or stroke. DIAGNOSIS  To find the cause of your weakness, your caregiver will take your history and perform a physical exam. Lab tests or X-rays may also be ordered, if needed. TREATMENT  Treatment of weakness depends on the cause of your symptoms and can vary greatly. HOME CARE INSTRUCTIONS   Rest as needed.  Eat a well-balanced diet.  Try to get some exercise every day.  Only take over-the-counter or prescription medicines as directed by your caregiver. SEEK MEDICAL CARE IF:   Your weakness seems to be getting worse or spreads to other parts of your body.  You develop new aches or pains. SEEK IMMEDIATE MEDICAL CARE IF:   You cannot perform your normal daily activities, such as getting dressed and feeding yourself.  You cannot walk up and down  stairs, or you feel exhausted when you do so.  You have shortness of breath or chest pain.  You have difficulty moving parts of your body.  You have weakness in only one area of the body or on only one side of the body.  You have a fever.  You have trouble speaking or swallowing.  You cannot control your bladder or bowel movements.  You have black or bloody vomit or stools. MAKE SURE YOU:  Understand these instructions.  Will watch your condition.  Will get help right away if you are not doing well or get worse. Document Released: 11/23/2005 Document Revised: 05/24/2012 Document Reviewed: 01/22/2012 [facility] Patient Information 2015 [employer] [place]. This information is not intended to replace advice given to you by your health care provider. Make sure you discuss any questions you have with your health care provider.

## 2014-07-26 ENCOUNTER — Encounter (HOSPITAL_COMMUNITY): Payer: Self-pay | Admitting: Emergency Medicine

## 2014-07-26 ENCOUNTER — Emergency Department (HOSPITAL_COMMUNITY): Payer: Medicare HMO

## 2014-07-26 ENCOUNTER — Inpatient Hospital Stay (HOSPITAL_COMMUNITY)
Admission: EM | Admit: 2014-07-26 | Discharge: 2014-07-29 | DRG: 682 | Disposition: A | Discharge status: Home / Self Care | Payer: Medicare HMO | Attending: Family Medicine | Admitting: Family Medicine

## 2014-07-26 DIAGNOSIS — Z794 Long term (current) use of insulin: Secondary | ICD-10-CM

## 2014-07-26 DIAGNOSIS — E860 Dehydration: Secondary | ICD-10-CM | POA: Diagnosis present

## 2014-07-26 DIAGNOSIS — N183 Chronic kidney disease, stage 3 unspecified: Secondary | ICD-10-CM

## 2014-07-26 DIAGNOSIS — I951 Orthostatic hypotension: Secondary | ICD-10-CM | POA: Diagnosis present

## 2014-07-26 DIAGNOSIS — I129 Hypertensive chronic kidney disease with stage 1 through stage 4 chronic kidney disease, or unspecified chronic kidney disease: Secondary | ICD-10-CM | POA: Diagnosis present

## 2014-07-26 DIAGNOSIS — R55 Syncope and collapse: Secondary | ICD-10-CM | POA: Diagnosis present

## 2014-07-26 DIAGNOSIS — Z8249 Family history of ischemic heart disease and other diseases of the circulatory system: Secondary | ICD-10-CM

## 2014-07-26 DIAGNOSIS — G9340 Encephalopathy, unspecified: Secondary | ICD-10-CM | POA: Diagnosis present

## 2014-07-26 DIAGNOSIS — E876 Hypokalemia: Secondary | ICD-10-CM

## 2014-07-26 DIAGNOSIS — IMO0001 Reserved for inherently not codable concepts without codable children: Secondary | ICD-10-CM | POA: Diagnosis present

## 2014-07-26 DIAGNOSIS — E039 Hypothyroidism, unspecified: Secondary | ICD-10-CM | POA: Diagnosis present

## 2014-07-26 DIAGNOSIS — IMO0002 Reserved for concepts with insufficient information to code with codable children: Secondary | ICD-10-CM

## 2014-07-26 DIAGNOSIS — E1165 Type 2 diabetes mellitus with hyperglycemia: Secondary | ICD-10-CM

## 2014-07-26 DIAGNOSIS — N184 Chronic kidney disease, stage 4 (severe): Secondary | ICD-10-CM | POA: Diagnosis present

## 2014-07-26 DIAGNOSIS — E119 Type 2 diabetes mellitus without complications: Secondary | ICD-10-CM

## 2014-07-26 DIAGNOSIS — D696 Thrombocytopenia, unspecified: Secondary | ICD-10-CM

## 2014-07-26 DIAGNOSIS — N179 Acute kidney failure, unspecified: Secondary | ICD-10-CM

## 2014-07-26 DIAGNOSIS — N400 Enlarged prostate without lower urinary tract symptoms: Secondary | ICD-10-CM

## 2014-07-26 DIAGNOSIS — R627 Adult failure to thrive: Secondary | ICD-10-CM

## 2014-07-26 LAB — CBG MONITORING, ED: Glucose-Capillary: 155 mg/dL — ABNORMAL HIGH (ref 70–99)

## 2014-07-26 NOTE — ED Notes (Signed)
Pt's blood sugar was 147 by ems.

## 2014-07-26 NOTE — ED Notes (Signed)
Per pt wife pt could not get pt to respond, had facial droop and foaming at the mouth. Pt was alert and oriented when ems arrived but blood pressure was in 80s/40s.

## 2014-07-26 NOTE — ED Provider Notes (Signed)
CSN: [id number]     Arrival date & time 07/26/14  2243 History   First MD Initiated Contact with Patient 07/26/14 2305    This chart was scribed for Joya Gaskins, MD by Marica Otter, ED Scribe. This patient was seen in room [facility] and the patient's care was started at 11:09 PM.  Chief Complaint  Patient presents with  . Altered Mental Status   The history is provided by the patient and the spouse. No language interpreter was used.   HPI Comments: Garrett Bradley is a 80 y.o. male, arriving via EMS, with medical hx noted below, who presents to the Emergency Department complaining of AMS. Per wife, pt came into the kitchen earlier today and he was staggering. Subsequently, pt took his insulin and "was out of it," for approximately 10 minutes. Per wife, pt had a facial droop, foaming at the mouth, was taking deep breaths and non-responsive. Pt was alert and oriented when EMS arrived though his pressure was in the 80s/40s and blood sugar was 147. Per wife, pt has not been doing well for the past couple of days. Pt states that his "sugar has been off for the past few days." Pt denies any prior similar episodes, chest pain, SOB, any pain, recent falls, numbness of the extremities, being on any blood thinners or head trauma.   Past Medical History  Diagnosis Date  . Hypertension   . Diabetes mellitus   . Hypothyroidism   . Depression   . Urinary problem   . Hypokalemia   . Enlarged prostate    Past Surgical History  Procedure Laterality Date  . Hernia repair    . Transurethral resection of prostate    . Transurethral resection of prostate  09/22/2011    Procedure: TRANSURETHRAL RESECTION OF THE PROSTATE (TURP);  Surgeon: Ky Barban;  Location: [facility];  Service: Urology;  Laterality: N/A;   Family History  Problem Relation Age of Onset  . Hypertension Mother   . Hypertension Brother   . Cerebral aneurysm Mother    History  Substance Use Topics  . Smoking status: Never  Smoker   . Smokeless tobacco: Never Used  . Alcohol Use: No    Review of Systems  Respiratory: Negative for shortness of breath.   Cardiovascular: Negative for chest pain.  Neurological: Positive for facial asymmetry and weakness. Negative for numbness.  Hematological: Does not bruise/bleed easily.  All other systems reviewed and are negative.     Allergies  Thiazide-type diuretics  Home Medications   Prior to Admission medications   Medication Sig Start Date End Date Taking? Authorizing Provider  ALPRAZolam Prudy Feeler) 1 MG tablet Take 1 mg by mouth at bedtime as needed for sleep.  10/02/11   Christiane Ha, MD  ciprofloxacin (CIPRO) 500 MG tablet Take 500 mg by mouth 2 (two) times daily. Completed course of medication on 07-03-14 06/25/14   Leatha Gilding, MD  clobetasol ointment (TEMOVATE) 0.05 % Apply 1 application topically daily as needed. For legs 02/09/14   Historical Provider, MD  diltiazem (CARDIZEM CD) 300 MG 24 hr capsule Take 300 mg by mouth daily.     Historical Provider, MD  ferrous sulfate 325 (65 FE) MG tablet Take 325 mg by mouth daily.  10/17/11   Vassie Loll, MD  fish oil-omega-3 fatty acids 1000 MG capsule Take 1 capsule (1 g total) by mouth 2 (two) times daily. 10/02/11   Christiane Ha, MD  furosemide (LASIX) 20 MG  tablet Take 40 mg by mouth 2 (two) times daily. 03/06/14   Donnetta Hutching, MD  insulin detemir (LEVEMIR FLEXPEN) 100 UNIT/ML injection Inject 50 Units into the skin at bedtime.     Historical Provider, MD  insulin lispro (HUMALOG KWIKPEN) 100 UNIT/ML injection Inject 5 Units into the skin 3 (three) times daily before meals.     Historical Provider, MD  levothyroxine (SYNTHROID, LEVOTHROID) 125 MCG tablet Take 125 mcg by mouth daily before breakfast.    Historical Provider, MD  metFORMIN (GLUCOPHAGE) 500 MG tablet Take 500 mg by mouth 2 (two) times daily with a meal.    Historical Provider, MD  polyethylene glycol (MIRALAX / GLYCOLAX) packet Take 17 g  by mouth daily as needed for mild constipation. 06/25/14   Costin Otelia Sergeant, MD  potassium chloride SA (K-DUR,KLOR-CON) 20 MEQ tablet Take 20 mEq by mouth 2 (two) times daily. Takes two tablets in the morning and takes one in the afternoon 02/15/13   Gwenyth Bender, NP  terazosin (HYTRIN) 10 MG capsule Take 10 mg by mouth daily. 01/29/14   Historical Provider, MD  Travoprost, BAK Free, (TRAVATAN) 0.004 % SOLN ophthalmic solution Place 1 drop into both eyes at bedtime.    Historical Provider, MD   Triage Vitals: BP 110/63  Pulse 71  Temp(Src) 98.1 F (36.7 C) (Oral)  Resp 20  Ht 6\' 4"  (1.93 m)  Wt 290 lb (131.543 kg)  BMI 35.31 kg/m2  SpO2 100% Physical Exam CONSTITUTIONAL: Well developed/well nourished HEAD: Normocephalic/atraumatic EYES: EOMI/PERRL ENMT: Mucous membranes moist NECK: supple no meningeal signs SPINE:entire spine nontender CV: S1/S2 noted, no murmurs/rubs/gallops noted LUNGS: Lungs are clear to auscultation bilaterally, no apparent distress ABDOMEN: soft, nontender, no rebound or guarding GU:no cva tenderness NEURO: Pt is awake/alert, moves all extremitiesx4, no facial droop, no arm or leg drift EXTREMITIES: pulses normal, full ROM SKIN: warm, color normal PSYCH: no abnormalities of mood noted  ED Course  Procedures  DIAGNOSTIC STUDIES: Oxygen Saturation is 100% on RA, nl by my interpretation.    COORDINATION OF CARE: 11:14 PM-Discussed treatment plan which includes imaging, EKG, and labs with pt and his family at bedside and they agreed to plan.  Pt without signs of acute CVA at this time He is back to baseline However he has worsening renal failure with episode of altered mental status Will admit D/w dr Onalee Hua, [facility] will admit   Labs Review Labs Reviewed  CBC WITH DIFFERENTIAL - Abnormal; Notable for the following:    Hemoglobin 12.5 (*)    HCT 37.2 (*)    Platelets 138 (*)    Neutrophils Relative % 79 (*)    Lymphocytes Relative 11 (*)    All other  components within normal limits  BASIC METABOLIC PANEL - Abnormal; Notable for the following:    Potassium 2.9 (*)    Glucose, Bld 150 (*)    BUN 43 (*)    Creatinine, Ser 2.41 (*)    GFR calc non Af Amer 24 (*)    GFR calc Af Amer 28 (*)    Anion gap 16 (*)    All other components within normal limits  CBG MONITORING, ED - Abnormal; Notable for the following:    Glucose-Capillary 155 (*)    All other components within normal limits  TROPONIN I  URINE RAPID DRUG SCREEN (HOSP PERFORMED)  URINALYSIS, ROUTINE W REFLEX MICROSCOPIC    Imaging Review Dg Chest 2 View  07/26/2014   CLINICAL DATA:  Altered  mental status, syncope.  EXAM: CHEST  2 VIEW  COMPARISON:  Chest radiograph June 24, 2014  FINDINGS: The cardiac silhouette is similarly enlarged from and mediastinal silhouette is nonsuspicious. Bibasilar strandy densities without pleural effusions or focal consolidation. Mildly elevated right hemidiaphragm. No pneumothorax. Soft tissue planes and included osseous structures are nonsuspicious.  IMPRESSION: Stable cardiomegaly, bibasilar atelectasis.   Electronically Signed   By: Awilda Metroourtnay  Bloomer   On: 07/26/2014 23:49   Ct Head Wo Contrast  07/27/2014   CLINICAL DATA:  Altered mental status.  Syncopal episode.  EXAM: CT HEAD WITHOUT CONTRAST  TECHNIQUE: Contiguous axial images were obtained from the base of the skull through the vertex without intravenous contrast.  COMPARISON:  None.  FINDINGS: The ventricles are normal in size and configuration. No extra-axial fluid collections are identified. The gray-white differentiation is normal. No CT findings for acute intracranial process such as hemorrhage or infarction. No mass lesions. The brainstem and cerebellum are grossly normal.  The bony structures are intact. The paranasal sinuses and mastoid air cells are clear. The globes are intact.  IMPRESSION: No acute intracranial findings or mass lesions. Mild age related cerebral atrophy,  ventriculomegaly and periventricular white matter disease.   Electronically Signed   By: Loralie ChampagneMark  Gallerani M.D.   On: 07/27/2014 00:13     EKG Interpretation None      MDM   Final diagnoses:  AKI (acute kidney injury)  Hypokalemia    Nursing notes including past medical history and social history reviewed and considered in documentation Labs/vital reviewed and considered xrays reviewed and considered   I personally performed the services described in this documentation, which was scribed in my presence. The recorded information has been reviewed and is accurate.      Joya Gaskinsonald W Adien Kimmel, MD 07/27/14 [phone]

## 2014-07-27 ENCOUNTER — Encounter (HOSPITAL_COMMUNITY): Payer: Self-pay | Admitting: *Deleted

## 2014-07-27 DIAGNOSIS — R55 Syncope and collapse: Secondary | ICD-10-CM | POA: Diagnosis present

## 2014-07-27 DIAGNOSIS — N183 Chronic kidney disease, stage 3 unspecified: Secondary | ICD-10-CM

## 2014-07-27 DIAGNOSIS — I951 Orthostatic hypotension: Secondary | ICD-10-CM | POA: Diagnosis present

## 2014-07-27 DIAGNOSIS — N184 Chronic kidney disease, stage 4 (severe): Secondary | ICD-10-CM | POA: Diagnosis present

## 2014-07-27 DIAGNOSIS — R627 Adult failure to thrive: Secondary | ICD-10-CM | POA: Diagnosis present

## 2014-07-27 DIAGNOSIS — Z794 Long term (current) use of insulin: Secondary | ICD-10-CM | POA: Diagnosis not present

## 2014-07-27 DIAGNOSIS — E876 Hypokalemia: Secondary | ICD-10-CM | POA: Diagnosis present

## 2014-07-27 DIAGNOSIS — E039 Hypothyroidism, unspecified: Secondary | ICD-10-CM | POA: Diagnosis present

## 2014-07-27 DIAGNOSIS — G9340 Encephalopathy, unspecified: Secondary | ICD-10-CM | POA: Diagnosis present

## 2014-07-27 DIAGNOSIS — IMO0001 Reserved for inherently not codable concepts without codable children: Secondary | ICD-10-CM | POA: Diagnosis present

## 2014-07-27 DIAGNOSIS — D696 Thrombocytopenia, unspecified: Secondary | ICD-10-CM

## 2014-07-27 DIAGNOSIS — N179 Acute kidney failure, unspecified: Secondary | ICD-10-CM | POA: Diagnosis present

## 2014-07-27 DIAGNOSIS — I129 Hypertensive chronic kidney disease with stage 1 through stage 4 chronic kidney disease, or unspecified chronic kidney disease: Secondary | ICD-10-CM | POA: Diagnosis present

## 2014-07-27 DIAGNOSIS — E860 Dehydration: Secondary | ICD-10-CM | POA: Diagnosis present

## 2014-07-27 DIAGNOSIS — N400 Enlarged prostate without lower urinary tract symptoms: Secondary | ICD-10-CM

## 2014-07-27 DIAGNOSIS — Z8249 Family history of ischemic heart disease and other diseases of the circulatory system: Secondary | ICD-10-CM | POA: Diagnosis not present

## 2014-07-27 LAB — BASIC METABOLIC PANEL
Anion gap: 14 (ref 5–15)
Anion gap: 16 — ABNORMAL HIGH (ref 5–15)
BUN: 42 mg/dL — ABNORMAL HIGH (ref 6–23)
BUN: 43 mg/dL — AB (ref 6–23)
CALCIUM: 9.2 mg/dL (ref 8.4–10.5)
CO2: 30 meq/L (ref 19–32)
CO2: 28 meq/L (ref 19–32)
CREATININE: 2.41 mg/dL — AB (ref 0.50–1.35)
Calcium: 9.3 mg/dL (ref 8.4–10.5)
Chloride: 98 mEq/L (ref 96–112)
Chloride: 99 mEq/L (ref 96–112)
Creatinine, Ser: 2.34 mg/dL — ABNORMAL HIGH (ref 0.50–1.35)
GFR calc Af Amer: 28 mL/min — ABNORMAL LOW (ref >90)
GFR calc non Af Amer: 24 mL/min — ABNORMAL LOW (ref >90)
GFR calc non Af Amer: 25 mL/min — ABNORMAL LOW (ref >90)
GFR, EST AFRICAN AMERICAN: 29 mL/min — AB (ref >90)
Glucose, Bld: 133 mg/dL — ABNORMAL HIGH (ref 70–99)
Glucose, Bld: 150 mg/dL — ABNORMAL HIGH (ref 70–99)
Potassium: 2.9 mEq/L — CL (ref 3.7–5.3)
Potassium: 3.3 mEq/L — ABNORMAL LOW (ref 3.7–5.3)
SODIUM: 143 meq/L (ref 137–147)
Sodium: 142 mEq/L (ref 137–147)

## 2014-07-27 LAB — CBC
HEMATOCRIT: 37.2 % — AB (ref 39.0–52.0)
Hemoglobin: 12.5 g/dL — ABNORMAL LOW (ref 13.0–17.0)
MCH: 28.3 pg (ref 26.0–34.0)
MCHC: 33.6 g/dL (ref 30.0–36.0)
MCV: 84.2 fL (ref 78.0–100.0)
PLATELETS: 128 10*3/uL — AB (ref 150–400)
RBC: 4.42 MIL/uL (ref 4.22–5.81)
RDW: 14.5 % (ref 11.5–15.5)
WBC: 5.8 10*3/uL (ref 4.0–10.5)

## 2014-07-27 LAB — CBC WITH DIFFERENTIAL/PLATELET
Basophils Absolute: 0 10*3/uL (ref 0.0–0.1)
Basophils Relative: 0 % (ref 0–1)
EOS PCT: 1 % (ref 0–5)
Eosinophils Absolute: 0 10*3/uL (ref 0.0–0.7)
HEMATOCRIT: 37.2 % — AB (ref 39.0–52.0)
HEMOGLOBIN: 12.5 g/dL — AB (ref 13.0–17.0)
LYMPHS PCT: 11 % — AB (ref 12–46)
Lymphs Abs: 0.7 10*3/uL (ref 0.7–4.0)
MCH: 28.1 pg (ref 26.0–34.0)
MCHC: 33.6 g/dL (ref 30.0–36.0)
MCV: 83.6 fL (ref 78.0–100.0)
MONO ABS: 0.5 10*3/uL (ref 0.1–1.0)
MONOS PCT: 9 % (ref 3–12)
NEUTROS ABS: 4.8 10*3/uL (ref 1.7–7.7)
Neutrophils Relative %: 79 % — ABNORMAL HIGH (ref 43–77)
Platelets: 138 10*3/uL — ABNORMAL LOW (ref 150–400)
RBC: 4.45 MIL/uL (ref 4.22–5.81)
RDW: 14.4 % (ref 11.5–15.5)
WBC: 6 10*3/uL (ref 4.0–10.5)

## 2014-07-27 LAB — TROPONIN I
Troponin I: <0.3 ng/mL (ref <0.30)
Troponin I: <0.3 ng/mL (ref <0.30)
Troponin I: <0.3 ng/mL (ref <0.30)

## 2014-07-27 LAB — GLUCOSE, CAPILLARY: Glucose-Capillary: 126 mg/dL — ABNORMAL HIGH (ref 70–99)

## 2014-07-27 MED ORDER — INSULIN ASPART 100 UNIT/ML ~~LOC~~ SOLN
0.0000 [IU] | Freq: Three times a day (TID) | SUBCUTANEOUS | Status: DC
  Administered 2014-07-28: 2 [IU] via SUBCUTANEOUS
  Administered 2014-07-29: 1 [IU] via SUBCUTANEOUS
  Administered 2014-07-29: 2 [IU] via SUBCUTANEOUS

## 2014-07-27 MED ORDER — INSULIN ASPART 100 UNIT/ML ~~LOC~~ SOLN: 0.0000 [IU] | Freq: Every day | SUBCUTANEOUS | Status: DC

## 2014-07-27 MED ORDER — ACETAMINOPHEN 325 MG PO TABS: 650.0000 mg | ORAL_TABLET | Freq: Four times a day (QID) | ORAL | Status: DC | PRN

## 2014-07-27 MED ORDER — SODIUM CHLORIDE 0.9 % IV SOLN
INTRAVENOUS | Status: DC
  Administered 2014-07-28 – 2014-07-29 (×4): via INTRAVENOUS

## 2014-07-27 MED ORDER — POTASSIUM CHLORIDE CRYS ER 20 MEQ PO TBCR
40.0000 meq | EXTENDED_RELEASE_TABLET | Freq: Once | ORAL | Status: AC
  Administered 2014-07-27: 40 meq via ORAL
  Filled 2014-07-27: qty 2

## 2014-07-27 MED ORDER — LATANOPROST 0.005 % OP SOLN
1.0000 [drp] | Freq: Every day | OPHTHALMIC | Status: DC
  Administered 2014-07-27 – 2014-07-28 (×2): 1 [drp] via OPHTHALMIC
  Filled 2014-07-27: qty 2.5

## 2014-07-27 MED ORDER — MEGESTROL ACETATE 400 MG/10ML PO SUSP
400.0000 mg | Freq: Every day | ORAL | Status: DC
  Administered 2014-07-27 – 2014-07-29 (×3): 400 mg via ORAL
  Filled 2014-07-27 (×3): qty 10

## 2014-07-27 MED ORDER — LATANOPROST 0.005 % OP SOLN
OPHTHALMIC | Status: AC
  Filled 2014-07-27: qty 2.5

## 2014-07-27 MED ORDER — LEVOTHYROXINE SODIUM 25 MCG PO TABS
125.0000 ug | ORAL_TABLET | Freq: Every day | ORAL | Status: DC
  Administered 2014-07-28 – 2014-07-29 (×2): 125 ug via ORAL
  Filled 2014-07-27 (×4): qty 1

## 2014-07-27 MED ORDER — POLYETHYLENE GLYCOL 3350 17 G PO PACK
17.0000 g | PACK | Freq: Two times a day (BID) | ORAL | Status: DC
  Administered 2014-07-27 – 2014-07-29 (×4): 17 g via ORAL
  Filled 2014-07-27 (×4): qty 1

## 2014-07-27 MED ORDER — POTASSIUM CHLORIDE IN NACL 20-0.9 MEQ/L-% IV SOLN
INTRAVENOUS | Status: AC
  Administered 2014-07-27: 04:00:00 via INTRAVENOUS

## 2014-07-27 MED ORDER — SODIUM CHLORIDE 0.9 % IJ SOLN
3.0000 mL | Freq: Two times a day (BID) | INTRAMUSCULAR | Status: DC
  Administered 2014-07-27: 3 mL via INTRAVENOUS

## 2014-07-27 MED ORDER — POTASSIUM CHLORIDE CRYS ER 20 MEQ PO TBCR
20.0000 meq | EXTENDED_RELEASE_TABLET | Freq: Two times a day (BID) | ORAL | Status: DC
  Administered 2014-07-28 – 2014-07-29 (×3): 20 meq via ORAL
  Filled 2014-07-27 (×3): qty 1

## 2014-07-27 MED ORDER — SODIUM CHLORIDE 0.9 % IV BOLUS (SEPSIS): 500.0000 mL | Freq: Once | INTRAVENOUS | Status: DC

## 2014-07-27 NOTE — Care Management Note (Signed)
    Page 1 of 1   07/27/2014     10:52:50 AM CARE MANAGEMENT NOTE 07/27/2014  Patient:  Garrett Bradley,Garrett Bradley   Account Number:  [id number]  Date Initiated:  07/27/2014  Documentation initiated by:  Kathyrn SheriffHILDRESS,JESSICA  Subjective/Objective Assessment:   Patient is from home with wife. Has strong family support with multiple children. Patient is independent with ADL's, has no HH services, DME's or medication needs prior to admission.     Action/Plan:   Pt plans to discharge home with self care. PT eval will be done prior to discharge to assess for any PT needs. [facility] if needed over the weekend.   Anticipated DC Date:  07/28/2014   Anticipated DC Plan:  HOME/SELF CARE      DC Planning Services  CM consult      Choice offered to / List presented to:             Status of service:  Completed, signed off Medicare Important Message given?   (If response is "NO", the following Medicare IM given date fields will be blank) Date Medicare IM given:   Medicare IM given by:   Date Additional Medicare IM given:   Additional Medicare IM given by:    Discharge Disposition:  HOME/SELF CARE  Per UR Regulation:    If discussed at Long Length of Stay Meetings, dates discussed:    Comments:  07/27/2014 1000 Kathyrn SheriffJessica Childress, RN, MSN, [facility]

## 2014-07-27 NOTE — H&P (Signed)
PCP:   Colette RibasGOLDING, [name], MD   Chief Complaint:  Passed out  HPI: 80 yo male h/o ckd, dm, htn comes in after a syncopal episode.  He was up in the kitchen walking around when he got very weak and dizzy, he walked to his chair in the living room, sat down and passed out.  No recent illnesses.  No fevers.  No n/v.  No focal neurological deficits after the syncopal episode.  There was no report of sz like activity by his wife who witnessed the event but is not currently present.  Pt states he has not had much of an appetite for months, and has lost 40 lbs in the last couple of months.    Review of Systems:  Positive and negative as per HPI otherwise all other systems are negative  Past Medical History: Past Medical History  Diagnosis Date  . Hypertension   . Diabetes mellitus   . Hypothyroidism   . Depression   . Urinary problem   . Hypokalemia   . Enlarged prostate    Past Surgical History  Procedure Laterality Date  . Hernia repair    . Transurethral resection of prostate    . Transurethral resection of prostate  09/22/2011    Procedure: TRANSURETHRAL RESECTION OF THE PROSTATE (TURP);  Surgeon: Ky BarbanMohammad I Javaid;  Location: [facility];  Service: Urology;  Laterality: N/A;    Medications: Prior to Admission medications   Medication Sig Start Date End Date Taking? Authorizing Provider  ALPRAZolam Prudy Feeler(XANAX) 1 MG tablet Take 1 mg by mouth at bedtime as needed for sleep.  10/02/11   Christiane Haorinna L Sullivan, MD  ciprofloxacin (CIPRO) 500 MG tablet Take 500 mg by mouth 2 (two) times daily. Completed course of medication on 07-03-14 06/25/14   Leatha Gildingostin M Gherghe, MD  clobetasol ointment (TEMOVATE) 0.05 % Apply 1 application topically daily as needed. For legs 02/09/14   Historical Provider, MD  diltiazem (CARDIZEM CD) 300 MG 24 hr capsule Take 300 mg by mouth daily.     Historical Provider, MD  ferrous sulfate 325 (65 FE) MG tablet Take 325 mg by mouth daily.  10/17/11   Vassie Lollarlos Madera, MD  fish  oil-omega-3 fatty acids 1000 MG capsule Take 1 capsule (1 g total) by mouth 2 (two) times daily. 10/02/11   Christiane Haorinna L Sullivan, MD  furosemide (LASIX) 20 MG tablet Take 40 mg by mouth 2 (two) times daily. 03/06/14   Donnetta HutchingBrian Cook, MD  insulin detemir (LEVEMIR FLEXPEN) 100 UNIT/ML injection Inject 50 Units into the skin at bedtime.     Historical Provider, MD  insulin lispro (HUMALOG KWIKPEN) 100 UNIT/ML injection Inject 5 Units into the skin 3 (three) times daily before meals.     Historical Provider, MD  levothyroxine (SYNTHROID, LEVOTHROID) 125 MCG tablet Take 125 mcg by mouth daily before breakfast.    Historical Provider, MD  metFORMIN (GLUCOPHAGE) 500 MG tablet Take 500 mg by mouth 2 (two) times daily with a meal.    Historical Provider, MD  polyethylene glycol (MIRALAX / GLYCOLAX) packet Take 17 g by mouth daily as needed for mild constipation. 06/25/14   Costin Otelia SergeantM Gherghe, MD  potassium chloride SA (K-DUR,KLOR-CON) 20 MEQ tablet Take 20 mEq by mouth 2 (two) times daily. Takes two tablets in the morning and takes one in the afternoon 02/15/13   Gwenyth BenderKaren M Black, NP  terazosin (HYTRIN) 10 MG capsule Take 10 mg by mouth daily. 01/29/14   Historical Provider, MD  Travoprost,  BAK Free, (TRAVATAN) 0.004 % SOLN ophthalmic solution Place 1 drop into both eyes at bedtime.    Historical Provider, MD    Allergies:   Allergies  Allergen Reactions  . Thiazide-Type Diuretics     Patient was just told not to take, not sure if an actual allergy    Social History:  reports that he has never smoked. He has never used smokeless tobacco. He reports that he does not drink alcohol or use illicit drugs.  Family History: Family History  Problem Relation Age of Onset  . Hypertension Mother   . Hypertension Brother   . Cerebral aneurysm Mother     Physical Exam: Filed Vitals:   07/26/14 2249 07/27/14 0021  BP: 110/63   Pulse: 71   Temp: 98.1 F (36.7 C) 98.1 F (36.7 C)  TempSrc: Oral   Resp: 20   Height:  6\' 4"  (1.93 m)   Weight: 131.543 kg (290 lb)   SpO2: 100%    General appearance: alert, cooperative and no distress Head: Normocephalic, without obvious abnormality, atraumatic Eyes: negative Nose: Nares normal. Septum midline. Mucosa normal. No drainage or sinus tenderness. Neck: no JVD and supple, symmetrical, trachea midline Lungs: clear to auscultation bilaterally Heart: regular rate and rhythm, S1, S2 normal, no murmur, click, rub or gallop Abdomen: soft, non-tender; bowel sounds normal; no masses,  no organomegaly Extremities: extremities normal, atraumatic, no cyanosis or edema Pulses: 2+ and symmetric Skin: Skin color, texture, turgor normal. No rashes or lesions Neurologic: Grossly normal  Labs on Admission:   Recent Labs  07/27/14 0021  NA 142  K 2.9*  CL 98  CO2 28  GLUCOSE 150*  BUN 43*  CREATININE 2.41*  CALCIUM 9.3     Recent Labs  07/27/14 0021  WBC 6.0  NEUTROABS 4.8  HGB 12.5*  HCT 37.2*  MCV 83.6  PLT 138*    Recent Labs  07/27/14 0021  TROPONINI <0.30    Radiological Exams on Admission: Dg Chest 2 View  07/26/2014   CLINICAL DATA:  Altered mental status, syncope.  EXAM: CHEST  2 VIEW  COMPARISON:  Chest radiograph June 24, 2014  FINDINGS: The cardiac silhouette is similarly enlarged from and mediastinal silhouette is nonsuspicious. Bibasilar strandy densities without pleural effusions or focal consolidation. Mildly elevated right hemidiaphragm. No pneumothorax. Soft tissue planes and included osseous structures are nonsuspicious.  IMPRESSION: Stable cardiomegaly, bibasilar atelectasis.   Electronically Signed   By: Awilda Metro   On: 07/26/2014 23:49   Ct Head Wo Contrast  07/27/2014   CLINICAL DATA:  Altered mental status.  Syncopal episode.  EXAM: CT HEAD WITHOUT CONTRAST  TECHNIQUE: Contiguous axial images were obtained from the base of the skull through the vertex without intravenous contrast.  COMPARISON:  None.  FINDINGS: The  ventricles are normal in size and configuration. No extra-axial fluid collections are identified. The gray-white differentiation is normal. No CT findings for acute intracranial process such as hemorrhage or infarction. No mass lesions. The brainstem and cerebellum are grossly normal.  The bony structures are intact. The paranasal sinuses and mastoid air cells are clear. The globes are intact.  IMPRESSION: No acute intracranial findings or mass lesions. Mild age related cerebral atrophy, ventriculomegaly and periventricular white matter disease.   Electronically Signed   By: Loralie Champagne M.D.   On: 07/27/2014 00:13    Assessment/Plan  80 yo male with syncopal episode, worsening renal failure and hypokalemia  Principal Problem:   Syncope-  Probable due  to orthostasis in the setting of dehydration/continued use of bp meds, diuretics with decreased po intake.  ivf overnight.  Ck orthostatics.  No focal neuro deficits.  Serial cardiac enzymes.  ekg nsr no acute issues.    Active Problems:  Stable unless o/w noted   ARF (acute renal failure)   CKD (chronic kidney disease), stage IV   Diabetes type 2, uncontrolled   Hypokalemia-  replete  Early satiety and recent report of wt loss should be followed as outpatient.    obs on tele.  Full code.  Ramses Klecka A 07/27/2014, 2:05 AM

## 2014-07-27 NOTE — Progress Notes (Signed)
PROGRESS NOTE  Garrett Bradley UJW:[id number] DOB: 05/05/1934 DOA: 07/26/2014 PCP: Colette RibasGOLDING, [name], MD  Summary: 80 year old man with history of diabetes mellitus presented to the emergency department with history of syncope at home, possible seizure-like activity, acute encephalopathy, hypotension. The patient reported he was in the kitchen walking around when he got weak, dizzy, sat down and passed out. Admitted for syncope.  Assessment/Plan: 1. Syncope. Suspect secondary to dehydration. Also consider  hypoglycemia or orthostasis. Orthostatics negative today. Chronic RBBB. Telemetry unremarkable. 2. Hypotension. Resolved. Likely secondary to very poor oral intake.  3. Hypokalemia  4. Acute renal failure superimposed on CKD stage III. no significant improvement yet. 5. Thrombocytopenia stable. Significance unclear. 6. DM type II. Start SSI, monitor CBG. Hold long-acting insulin and meal coverage for now. 7. Hypothyroidism. TSH within normal limits 06/24/2014.  8. Possible depression 9. Failure to thrive, Weight loss 40 lbs over last few months   Overall seems improved. Suspect syncope was related to very poor oral intake, dehydration, possibly hypoglycemia or orthostasis. Troponins and negative. 2-D echocardiogram from December 2014 unremarkable, will not repeat.   Continue IV fluids, replete potassium.  Check basic metabolic panel, CBC in the morning.  Discussed with wife at bedside  Code Status: full code DVT prophylaxis: SCDs Family Communication:  Disposition Plan: home  Brendia Sacksaniel Bayani Renteria, MD  [facility]  Pager [phone] If 7PM-7AM, please contact night-coverage at [email or web address], password [email or web address] 07/27/2014, 10:12 AM  LOS: 1 day   Consultants:    Procedures:    Antibiotics:    HPI/Subjective: Overall feeling better today. Still has poor appetite. Wife at bedside reports significant weight loss over the last several months with poor appetite and very poor  oral intake. Drinks very little fluid. He reports problems with his blood sugars at home.  He reports she got up yesterday felt lightheaded, like he was going to pass out, was able to sit down in a chair. No trauma. Wife reports he had some drooling and confusion but no seizure activity.  Objective: Filed Vitals:   07/27/14 0320 07/27/14 0323 07/27/14 0325 07/27/14 0540  BP: 125/68 126/70 129/67 129/63  Pulse: 64 70 63 62  Temp: 98.1 F (36.7 C)   98 F (36.7 C)  TempSrc: Oral   Oral  Resp: 20   20  Height:      Weight:    121.836 kg (268 lb 9.6 oz)  SpO2: 100% 99% 100% 100%    Intake/Output Summary (Last 24 hours) at 07/27/14 1012 Last data filed at 07/27/14 0948  Gross per 24 hour  Intake    120 ml  Output      0 ml  Net    120 ml     Filed Weights   07/26/14 2249 07/27/14 0540  Weight: 131.543 kg (290 lb) 121.836 kg (268 lb 9.6 oz)    Exam:     Afebrile, vital signs stable. No hypoxia. Gen. Appears calm and comfortable.  Psych. Alert. Speech fluent and clear.  Eyes. Pupils equal, round.  ENT. Lips and tongue appear unremarkable.  Cardiovascular. Regular rate and rhythm. No murmur, rub or gallop. No lower extremity edema. Telemetry sinus rhythm.  Respiratory. Clear to auscultation bilaterally. No wheezes, rales or rhonchi. Normal respiratory effort.  Abdomen. Soft  Musculoskeletal. Strength 5/5, symmetric all extremities.  Neurologic. Cranial nerves 2-12 intact. No upper extremity dysdiadochokinesis. No pronator drift.  Data Reviewed:  Potassium 3.3, BUN/creatinine without significant change. Troponins negative.  Platelet count 128, stable. Anemia stable, 12.5.  Scheduled Meds: . sodium chloride  3 mL Intravenous Q12H   Continuous Infusions: . 0.9 % NaCl with KCl 20 mEq / L 75 mL/hr at 07/27/14 4098    Principal Problem:   Syncope Active Problems:   ARF (acute renal failure)   Diabetes type 2, uncontrolled   Hypokalemia   CKD (chronic kidney  disease) stage 3, GFR 30-59 ml/min   Failure to thrive in adult   Thrombocytopenia, unspecified   Time spent 25 minutes

## 2014-07-27 NOTE — Progress Notes (Signed)
Marylu LundJanet, RN called and reported that patient's family had questions about insulins patient is taking as an outpatient. Spoke with the patient and his family regarding diabetes and home regimen for diabetes control. Patient states that he is followed by Dr. Fransico HimNida for diabetes management and that he has an appointment scheduled with him in October. Patient reports that he takes Levemir 50 units QHS, Humalog 5 units TID, and Metformin 500 mg BID as an outpatient for diabetes control. Patient's family concerned about patient taking insulin without eating. Patient states that he has lost over 40 lbs over the past couple of months and he has not had an appetite for months. Patient reports that he checks his glucose 2-3 times per day and it usually runs in the low 100's mg/dl. Inquired about low blood glucose and patient reports that he has "a few lows per month". Discussed signs and symptoms of hypoglycemia along with treatment. Discussed Levemir and Humalog (onset and duration). Patient reports that he always takes his Levemir insulin unless he forgets to take it (which he reports as an occasional incident). Informed patient that he needed to be sure to let Dr. Fransico HimNida know how his glucose was running, about lows he has been having, about recent weight loss, and that he had a poor appetite and was unable to eat most day. Encouraged patient to see Dr. Fransico HimNida before his next scheduled October appointment and patient requested I make an appointment with Dr. Fransico HimNida for him.  Called Dr. Isidoro DonningNida's office and made an appointment with Dr. Fransico HimNida for 08/01/14 @ 10:00am. Informed patient of appointment date and time. Patient and his family verbalized understanding of information discussed and stated they have no further questions related to diabetes at this time.  Thanks, Orlando PennerMarie Sundiata Ferrick, RN, MSN, CCRN Diabetes Coordinator [facility] [phone] (Team Pager) [phone] ([facility] office) [phone] [facility] office)

## 2014-07-27 NOTE — ED Notes (Signed)
CRITICAL VALUE ALERT  Critical value received:  Potassium 2.9   Date of notification:  07/27/2014  Time of notification: 0057  Critical value read back:Yes.    Nurse who received alert:  Lilia ProA Lizbet Cirrincione RN  MD notified (1st page):  Dr. Bebe ShaggyWickline  Time of first page:  0057  MD notified (2nd page):  Time of second page:  Responding MD:  Dr. Bebe ShaggyWickline  Time MD responded: [phone]

## 2014-07-28 DIAGNOSIS — R627 Adult failure to thrive: Secondary | ICD-10-CM

## 2014-07-28 DIAGNOSIS — IMO0001 Reserved for inherently not codable concepts without codable children: Secondary | ICD-10-CM

## 2014-07-28 DIAGNOSIS — E1165 Type 2 diabetes mellitus with hyperglycemia: Secondary | ICD-10-CM

## 2014-07-28 LAB — URINALYSIS, ROUTINE W REFLEX MICROSCOPIC
Bilirubin Urine: NEGATIVE
Glucose, UA: NEGATIVE mg/dL
Hgb urine dipstick: NEGATIVE
Ketones, ur: NEGATIVE mg/dL
LEUKOCYTES UA: NEGATIVE
Nitrite: NEGATIVE
PH: 5 (ref 5.0–8.0)
Protein, ur: NEGATIVE mg/dL
Specific Gravity, Urine: 1.015 (ref 1.005–1.030)
Urobilinogen, UA: 0.2 mg/dL (ref 0.0–1.0)

## 2014-07-28 LAB — GLUCOSE, CAPILLARY
GLUCOSE-CAPILLARY: 116 mg/dL — AB (ref 70–99)
Glucose-Capillary: 150 mg/dL — ABNORMAL HIGH (ref 70–99)
Glucose-Capillary: 170 mg/dL — ABNORMAL HIGH (ref 70–99)
Glucose-Capillary: 188 mg/dL — ABNORMAL HIGH (ref 70–99)

## 2014-07-28 LAB — RAPID URINE DRUG SCREEN, HOSP PERFORMED
Amphetamines: NOT DETECTED
BENZODIAZEPINES: NOT DETECTED
Barbiturates: NOT DETECTED
COCAINE: NOT DETECTED
OPIATES: NOT DETECTED
Tetrahydrocannabinol: NOT DETECTED

## 2014-07-28 LAB — CBC
HCT: 36.2 % — ABNORMAL LOW (ref 39.0–52.0)
Hemoglobin: 11.9 g/dL — ABNORMAL LOW (ref 13.0–17.0)
MCH: 28.2 pg (ref 26.0–34.0)
MCHC: 32.9 g/dL (ref 30.0–36.0)
MCV: 85.8 fL (ref 78.0–100.0)
Platelets: 154 10*3/uL (ref 150–400)
RBC: 4.22 MIL/uL (ref 4.22–5.81)
RDW: 15.1 % (ref 11.5–15.5)
WBC: 5.8 10*3/uL (ref 4.0–10.5)

## 2014-07-28 LAB — BASIC METABOLIC PANEL
ANION GAP: 10 (ref 5–15)
BUN: 28 mg/dL — ABNORMAL HIGH (ref 6–23)
CO2: 31 mEq/L (ref 19–32)
Calcium: 9 mg/dL (ref 8.4–10.5)
Chloride: 104 mEq/L (ref 96–112)
Creatinine, Ser: 1.64 mg/dL — ABNORMAL HIGH (ref 0.50–1.35)
GFR, EST AFRICAN AMERICAN: 45 mL/min — AB (ref >90)
GFR, EST NON AFRICAN AMERICAN: 38 mL/min — AB (ref >90)
GLUCOSE: 124 mg/dL — AB (ref 70–99)
POTASSIUM: 3.8 meq/L (ref 3.7–5.3)
SODIUM: 145 meq/L (ref 137–147)

## 2014-07-28 MED ORDER — DILTIAZEM HCL ER COATED BEADS 180 MG PO CP24
300.0000 mg | ORAL_CAPSULE | Freq: Every day | ORAL | Status: DC
  Administered 2014-07-28 – 2014-07-29 (×2): 300 mg via ORAL
  Filled 2014-07-28 (×4): qty 1

## 2014-07-28 MED ORDER — POLYETHYLENE GLYCOL 3350 17 G PO PACK: 17.0000 g | PACK | Freq: Every day | ORAL | Status: DC | PRN

## 2014-07-28 NOTE — Progress Notes (Signed)
PROGRESS NOTE  Garrett Bradley ZOX:[id number] DOB: 10-04-1934 DOA: 07/26/2014 PCP: Colette Ribas, MD  Summary: 80 year old man with history of diabetes mellitus presented to the emergency department with history of syncope at home, possible seizure-like activity, acute encephalopathy, hypotension. The patient reported he was in the kitchen walking around when he got weak, dizzy, sat down and passed out. Admitted for syncope.  Assessment/Plan: 1. Syncope. Suspect secondary to dehydration. Also consider  hypoglycemia or orthostasis. Orthostatics negative today. Chronic RBBB. Telemetry unremarkable. No further evaluation suggested at this point. 2. Hypotension. Resolved. Likely secondary to very poor oral intake.   3. Acute renal failure superimposed on CKD stage III. Improving with IVF. 4. DM type II. Stable with SSI alone. 5. Hypothyroidism. TSH within normal limits 06/24/2014.  6. Possible depression 7. Failure to thrive, Weight loss 40 lbs over last few months   Overall improved today, renal function is normalizing, adequate urine output. Blood sugars have been stable with minimal insulin. I suspect a lot of his symptoms related to fluctuations in blood sugar.  Plan additional IV fluids today, repeat basic metabolic panel in the morning, follow blood sugars. Likely discharge home 8/23 with close outpatient followup with endocrinology.  Code Status: full code DVT prophylaxis: SCDs Family Communication:  Disposition Plan: home  Brendia Sacks, MD  [facility]  Pager [phone] If 7PM-7AM, please contact night-coverage at [email or web address], password [facility] 07/28/2014, 1:47 PM  LOS: 2 days   Consultants:    Procedures:    Antibiotics:    HPI/Subjective: Eating a little better today. No new complaints.   Objective: Filed Vitals:   07/27/14 0540 07/27/14 1441 07/27/14 2113 07/28/14 0456  BP: 129/63 140/64 149/77 161/78  Pulse: 62 62 64 67  Temp: 98 F (36.7 C) 98.4  F (36.9 C) 98.4 F (36.9 C) 98.3 F (36.8 C)  TempSrc: Oral Oral Oral Oral  Resp: Height:      Weight: 121.836 kg (268 lb 9.6 oz)   124.966 kg (275 lb 8 oz)  SpO2: 100% 98% 99% 100%    Intake/Output Summary (Last 24 hours) at 07/28/14 1347 Last data filed at 07/28/14 1000  Gross per 24 hour  Intake   2557 ml  Output   1225 ml  Net   1332 ml     Filed Weights   07/26/14 2249 07/27/14 0540 07/28/14 0456  Weight: 131.543 kg (290 lb) 121.836 kg (268 lb 9.6 oz) 124.966 kg (275 lb 8 oz)    Exam:     Afebrile, vitals stable. No hypoxia.  Gen. Appears calm, comfortable.  Alert. Speech fluent and clear.  Respiratory clear to auscultation bilaterally. No wheezes, rales or rhonchi. Normal respiratory effort.  Cardiovascular regular rate and rhythm. No murmur, rub or gallop. No lower extremity edema.  Data Reviewed:  Urine output 1575. BUN and creatinine with significant improvement. Thrombocytopenia has resolved. Hemoglobin stable 11.9. Urine drug screen unremarkable. Urinalysis negative.  Scheduled Meds: . insulin aspart  0-5 Units Subcutaneous QHS  . insulin aspart  0-9 Units Subcutaneous TID WC  . latanoprost  1 drop Both Eyes QHS  . levothyroxine  125 mcg Oral QAC breakfast  . megestrol  400 mg Oral Daily  . polyethylene glycol  17 g Oral BID  . potassium chloride SA  20 mEq Oral BID  . sodium chloride  3 mL Intravenous Q12H   Continuous Infusions: . sodium chloride 125 mL/hr at 07/28/14 0602    Principal Problem:  Syncope Active Problems:   ARF (acute renal failure)   Diabetes type 2, uncontrolled   Hypokalemia   CKD (chronic kidney disease) stage 3, GFR 30-59 ml/min   Failure to thrive in adult   Thrombocytopenia, unspecified   Time spent 15 minutes

## 2014-07-29 DIAGNOSIS — E119 Type 2 diabetes mellitus without complications: Secondary | ICD-10-CM

## 2014-07-29 LAB — GLUCOSE, CAPILLARY
GLUCOSE-CAPILLARY: 146 mg/dL — AB (ref 70–99)
GLUCOSE-CAPILLARY: 166 mg/dL — AB (ref 70–99)

## 2014-07-29 LAB — BASIC METABOLIC PANEL
Anion gap: 11 (ref 5–15)
BUN: 18 mg/dL (ref 6–23)
CO2: 27 mEq/L (ref 19–32)
Calcium: 8.6 mg/dL (ref 8.4–10.5)
Chloride: 105 mEq/L (ref 96–112)
Creatinine, Ser: 1.42 mg/dL — ABNORMAL HIGH (ref 0.50–1.35)
GFR, EST AFRICAN AMERICAN: 53 mL/min — AB (ref >90)
GFR, EST NON AFRICAN AMERICAN: 46 mL/min — AB (ref >90)
Glucose, Bld: 183 mg/dL — ABNORMAL HIGH (ref 70–99)
POTASSIUM: 3.7 meq/L (ref 3.7–5.3)
SODIUM: 143 meq/L (ref 137–147)

## 2014-07-29 MED ORDER — INSULIN LISPRO 100 UNIT/ML ~~LOC~~ SOLN: 3.0000 [IU] | Freq: Three times a day (TID) | SUBCUTANEOUS | Status: DC

## 2014-07-29 MED ORDER — MEGESTROL ACETATE 400 MG/10ML PO SUSP: 400.0000 mg | Freq: Every day | ORAL | Status: DC

## 2014-07-29 NOTE — Discharge Summary (Signed)
Physician Discharge Summary  Garrett Bradley:[id number] DOB: [birth year] DOA: 07/26/2014  PCP: Garrett Ribas, MD  Admit date: 07/26/2014 Discharge date: 07/29/2014  Recommendations for Outpatient Follow-up:  1. Acute renal failure, consider repeat basic metabolic panel as an outpatient. Lasix currently on hold, potassium supplementation currently on hold; may need to resume based on clinical condition, deferred to primary care physician. 2. Diabetes mellitus. See discussion below. Currently long-acting insulin on hold. 3. Consider evaluation for possible depression 4. Failure to thrive with significant weight loss. Consider referral to gastroenterology as an outpatient for further evaluation. Trial of Megace   Follow-up Information   Follow up with NIDA,GEBRESELASSIE, MD On 08/01/2014. (at 10:00am)    Specialty:  Endocrinology   Contact information:   [address] [place] [address] [phone]       Follow up with Garrett Ribas, MD On 07/30/2014. (keep already scheduled appointment )    Specialty:  Family Medicine   Contact information:   [address] [place] [address] [phone]      Discharge Diagnoses:  1. Syncope 2. Orthostatic hypotension 3. Acute renal failure , Dehydration 4. Diabetes mellitus type 2 5. Chronic kidney disease stage III 6. Possible depression 7. Failure to thrive  Discharge Condition: Improved Disposition: Home  Diet recommendation: Diabetic diet, heart healthy  Filed Weights   07/26/14 2249 07/27/14 0540 07/28/14 0456  Weight: 131.543 kg (290 lb) 121.836 kg (268 lb 9.6 oz) 124.966 kg (275 lb 8 oz)    History of present illness:  80 year old man with history of diabetes mellitus presented to the emergency department with history of syncope at home, possible seizure-like activity, acute encephalopathy, hypotension. The patient reported he was in the kitchen walking around when he got weak, dizzy, sat down and  passed out. Admitted for syncope.  Hospital Course:  [name] was admitted for further evaluation for syncope, acute renal failure and hypotension. He had no recurrent syncope and telemetry and troponins were unremarkable. His history is most suggestive of vasovagal phenomenon, dehydration and possible hypoglycemia. No focal neurologic deficits were noted. He had no recurrent syncope. Hypotension resolved with fluids. Acute renal failure resolving rapidly with fluids and withholding of Lasix. His blood sugars have been very well-controlled with only a total of 5 units of short-acting insulin during this hospitalization, no long acting insulin. Suspect a component of his syncope secondary to low blood sugars. He is now stable for discharge, individual issues as below.  1. Syncope. Suspect secondary to dehydration. Also consider hypoglycemia or orthostasis. Orthostatics negative here. Chronic RBBB. Telemetry unremarkable. No further evaluation suggested at this point. 2. Hypotension. Resolved. Now hypertensive. Likely secondary to poor oral intake, dehydration. 3. Acute renal failure superimposed on CKD stage III. Very close to baseline at this point. 4. DM type II. Remains stable with SSI alone. 5. Hypothyroidism. TSH within normal limits 06/24/2014.  6. Possible depression 7. Failure to thrive, Weight loss 40 lbs over last few months Continues to improve. Oral intake fair. Blood sugars remain well controlled with minimal. Kidney function is nearly normalized.  Discussed in detail with wife and multiple family members at bedside. Thank main issue is fluctuations in blood sugar. He has required 5 total units of short-acting insulin during his hospitalization with blood sugars less than 200. Therefore I have instructed him to discontinue Levemir, and only take lispro 3 units with meals, IF he eats well. He was able to verbalize this plan to me on teachback with family  present.  I will discuss further  with his endocrinoogist 8/24.  He has an appt with his PCP already scheduled for 8/24. Hold Lasix for now.  Outpatient GI referral recommended for evaluation of weight loss, FTT, poor appetite.  Consultants: none Procedures: none Antibiotics: none  Discharge Instructions  Discharge Instructions   Diet - low sodium heart healthy    Complete by:  As directed      Diet Carb Modified    Complete by:  As directed      Discharge instructions    Complete by:  As directed   Call your physician or seek immediate medical attention for passing out, blood sugars higher than 300 or less than 70. GI office will contact you with appointment.     Increase activity slowly    Complete by:  As directed             Medication List    STOP taking these medications       furosemide 40 MG tablet  Commonly known as:  LASIX     LEVEMIR FLEXPEN 100 UNIT/ML injection  Generic drug:  insulin detemir     potassium chloride SA 20 MEQ tablet  Commonly known as:  K-DUR,KLOR-CON      TAKE these medications       ALPRAZolam 1 MG tablet  Commonly known as:  XANAX  Take 1 mg by mouth at bedtime as needed for sleep.     clobetasol ointment 0.05 %  Commonly known as:  TEMOVATE  Apply 1 application topically daily as needed. For legs     cyanocobalamin 2000 MCG tablet  Take 2,000 mcg by mouth daily.     diltiazem 300 MG 24 hr capsule  Commonly known as:  CARDIZEM CD  Take 300 mg by mouth daily.     ferrous sulfate 325 (65 FE) MG tablet  Take 325 mg by mouth daily.     fish oil-omega-3 fatty acids 1000 MG capsule  Take 1 capsule (1 g total) by mouth 2 (two) times daily.     insulin lispro 100 UNIT/ML injection  Commonly known as:  HUMALOG  Inject 0.03 mLs (3 Units total) into the skin 3 (three) times daily before meals.     levothyroxine 125 MCG tablet  Commonly known as:  SYNTHROID, LEVOTHROID  Take 125 mcg by mouth daily before breakfast.     megestrol 400 MG/10ML suspension  Commonly  known as:  MEGACE  Take 10 mLs (400 mg total) by mouth daily.     metFORMIN 500 MG tablet  Commonly known as:  GLUCOPHAGE  Take 500 mg by mouth 2 (two) times daily with a meal.     polyethylene glycol packet  Commonly known as:  MIRALAX / GLYCOLAX  Take 17 g by mouth daily as needed for mild constipation.     terazosin 10 MG capsule  Commonly known as:  HYTRIN  Take 10 mg by mouth daily.     Travoprost (BAK Free) 0.004 % Soln ophthalmic solution  Commonly known as:  TRAVATAN  Place 1 drop into both eyes at bedtime.       Allergies  Allergen Reactions  . Thiazide-Type Diuretics     Patient was just told not to take, not sure if an actual allergy    The results of significant diagnostics from this hospitalization (including imaging, microbiology, ancillary and laboratory) are listed below for reference.    Significant Diagnostic Studies: Dg Chest 2 View  07/26/2014  CLINICAL DATA:  Altered mental status, syncope.  EXAM: CHEST  2 VIEW  COMPARISON:  Chest radiograph June 24, 2014  FINDINGS: The cardiac silhouette is similarly enlarged from and mediastinal silhouette is nonsuspicious. Bibasilar strandy densities without pleural effusions or focal consolidation. Mildly elevated right hemidiaphragm. No pneumothorax. Soft tissue planes and included osseous structures are nonsuspicious.  IMPRESSION: Stable cardiomegaly, bibasilar atelectasis.   Electronically Signed   By: Awilda Metro   On: 07/26/2014 23:49   Ct Head Wo Contrast  07/27/2014   CLINICAL DATA:  Altered mental status.  Syncopal episode.  EXAM: CT HEAD WITHOUT CONTRAST  TECHNIQUE: Contiguous axial images were obtained from the base of the skull through the vertex without intravenous contrast.  COMPARISON:  None.  FINDINGS: The ventricles are normal in size and configuration. No extra-axial fluid collections are identified. The gray-white differentiation is normal. No CT findings for acute intracranial process such as  hemorrhage or infarction. No mass lesions. The brainstem and cerebellum are grossly normal.  The bony structures are intact. The paranasal sinuses and mastoid air cells are clear. The globes are intact.  IMPRESSION: No acute intracranial findings or mass lesions. Mild age related cerebral atrophy, ventriculomegaly and periventricular white matter disease.   Electronically Signed   By: Loralie Champagne M.D.   On: 07/27/2014 00:13    Labs: Basic Metabolic Panel:  Recent Labs Lab 07/27/14 0021 07/27/14 0343 07/28/14 0600 07/29/14 0902  NA 142 143 145 143  K 2.9* 3.3* 3.8 3.7  CL 98 99 104 105  CO2 GLUCOSE 150* 133* 124* 183*  BUN 43* 42* 28* 18  CREATININE 2.41* 2.34* 1.64* 1.42*  CALCIUM 9.3 9.2 9.0 8.6   CBC:  Recent Labs Lab 07/27/14 0021 07/27/14 0343 07/28/14 0600  WBC 6.0 5.8 5.8  NEUTROABS 4.8  --   --   HGB 12.5* 12.5* 11.9*  HCT 37.2* 37.2* 36.2*  MCV 83.6 84.2 85.8  PLT 138* 128* 154   Cardiac Enzymes:  Recent Labs Lab 07/27/14 0021 07/27/14 0343 07/27/14 0948 07/27/14 1527  TROPONINI <0.30 <0.30 <0.30 <0.30     Recent Labs  09/06/13 1420  PROBNP 216.4   CBG:  Recent Labs Lab 07/28/14 1125 07/28/14 1609 07/28/14 2038 07/29/14 0725 07/29/14 1145  GLUCAP 150* 170* 188* 146* 166*    Principal Problem:   Syncope Active Problems:   ARF (acute renal failure)   Diabetes type 2, uncontrolled   Hypokalemia   CKD (chronic kidney disease) stage 3, GFR 30-59 ml/min   Failure to thrive in adult   Thrombocytopenia, unspecified   Time coordinating discharge: 35 minutes  Signed:  Brendia Sacks, MD [facility] 07/29/2014, 2:36 PM

## 2014-07-29 NOTE — Progress Notes (Signed)
PROGRESS NOTE  Garrett Bradley QMV:[id number] DOB: 19-Jun-1934 DOA: 07/26/2014 PCP: Garrett Ribas, MD  Summary: 80 year old man with history of diabetes mellitus presented to the emergency department with history of syncope at home, possible seizure-like activity, acute encephalopathy, hypotension. The patient reported he was in the kitchen walking around when he got weak, dizzy, sat down and passed out. Admitted for syncope.  Assessment/Plan: 1. Syncope. Suspect secondary to dehydration. Also consider  hypoglycemia or orthostasis. Orthostatics negative here. Chronic RBBB. Telemetry unremarkable. No further evaluation suggested at this point. 2. Hypotension. Resolved. Now hypertensive. Likely secondary to poor oral intake, dehydration. 3. Acute renal failure superimposed on CKD stage III. very close to baseline at this point. 4. DM type II. Remains stable with SSI alone. 5. Hypothyroidism. TSH within normal limits 06/24/2014.  6. Possible depression 7. Failure to thrive, Weight loss 40 lbs over last few months   Continues to improve. Oral intake fair. Blood sugars remain well controlled with minimal. Kidney function is nearly normalized.  Discussed in detail with wife and multiple family members at bedside. Thank main issue is fluctuations in blood sugar. He has required 5 total units of short-acting insulin during his hospitalization with blood sugars less than 200. Therefore I have instructed him to discontinue Levemir, and only take lispro 3 units with meals, IF he eats well. He was able to verbalize this plan to me on teachback with family present.  I will discuss further with his endocrinoogist 8/24.  He has an appt with his PCP already scheduled for 8/24. Hold Lasix for now.  Outpatient GI referral recommended for evaluation of weight loss, FTT, poor appetitie.  Garrett Sacks, MD  [facility]  Pager [phone] If 7PM-7AM, please contact night-coverage at [email or web address],  password [facility] 07/29/2014, 2:22 PM  LOS: 3 days   Consultants:    Procedures:    Antibiotics:    HPI/Subjective: Overall feels okay. Eating fairly well. No new issues. No complaints of pain. No shortness of breath.  Objective: Filed Vitals:   07/28/14 0456 07/28/14 1500 07/28/14 2158 07/29/14 0524  BP: 161/78 163/89 149/76 158/84  Pulse: 67 94 75 68  Temp: 98.3 F (36.8 C) 100.3 F (37.9 C) 100.3 F (37.9 C) 99.6 F (37.6 C)  TempSrc: Oral Oral Oral Oral  Resp: Height:      Weight: 124.966 kg (275 lb 8 oz)     SpO2: 100% 99% 96% 100%    Intake/Output Summary (Last 24 hours) at 07/29/14 1422 Last data filed at 07/29/14 1342  Gross per 24 hour  Intake 1897.08 ml  Output    800 ml  Net 1097.08 ml     Filed Weights   07/26/14 2249 07/27/14 0540 07/28/14 0456  Weight: 131.543 kg (290 lb) 121.836 kg (268 lb 9.6 oz) 124.966 kg (275 lb 8 oz)    Exam:  Afebrile, Tmax 100.3, vital signs stable. No hypoxia.   Gen. Appears calm, comfortable.  Cardiovascular regular rate and rhythm. No murmur, rub or gallop. No lower extremity edema. Telemetry sinus rhythm.  Respiratory clear to auscultation bilaterally. No wheezes, rales or rhonchi. Normal respiratory effort.  Bilateral lower extremity strength excellent. Lifts both legs off the bed easily.  Data Reviewed:  Blood sugars stable. BUN has normalized. Creatinine continues to trend downward.  Scheduled Meds: . diltiazem  300 mg Oral Daily  . insulin aspart  0-5 Units Subcutaneous QHS  . insulin aspart  0-9 Units Subcutaneous TID WC  .  latanoprost  1 drop Both Eyes QHS  . levothyroxine  125 mcg Oral QAC breakfast  . megestrol  400 mg Oral Daily  . polyethylene glycol  17 g Oral BID  . potassium chloride SA  20 mEq Oral BID  . sodium chloride  3 mL Intravenous Q12H   Continuous Infusions: . sodium chloride 125 mL/hr at 07/29/14 1610    Principal Problem:   Syncope Active Problems:   ARF  (acute renal failure)   Diabetes type 2, uncontrolled   Hypokalemia   CKD (chronic kidney disease) stage 3, GFR 30-59 ml/min   Failure to thrive in adult   Thrombocytopenia, unspecified

## 2014-08-30 IMAGING — CR DG CHEST 2V
2 series · 2 of 2 positions shown · non-contrast
Comparison: 04/04/2014

CLINICAL DATA: Constipation.  Weakness.  Acute renal failure.

EXAM:
CHEST  2 VIEW

[view not recorded (1 of 2)]
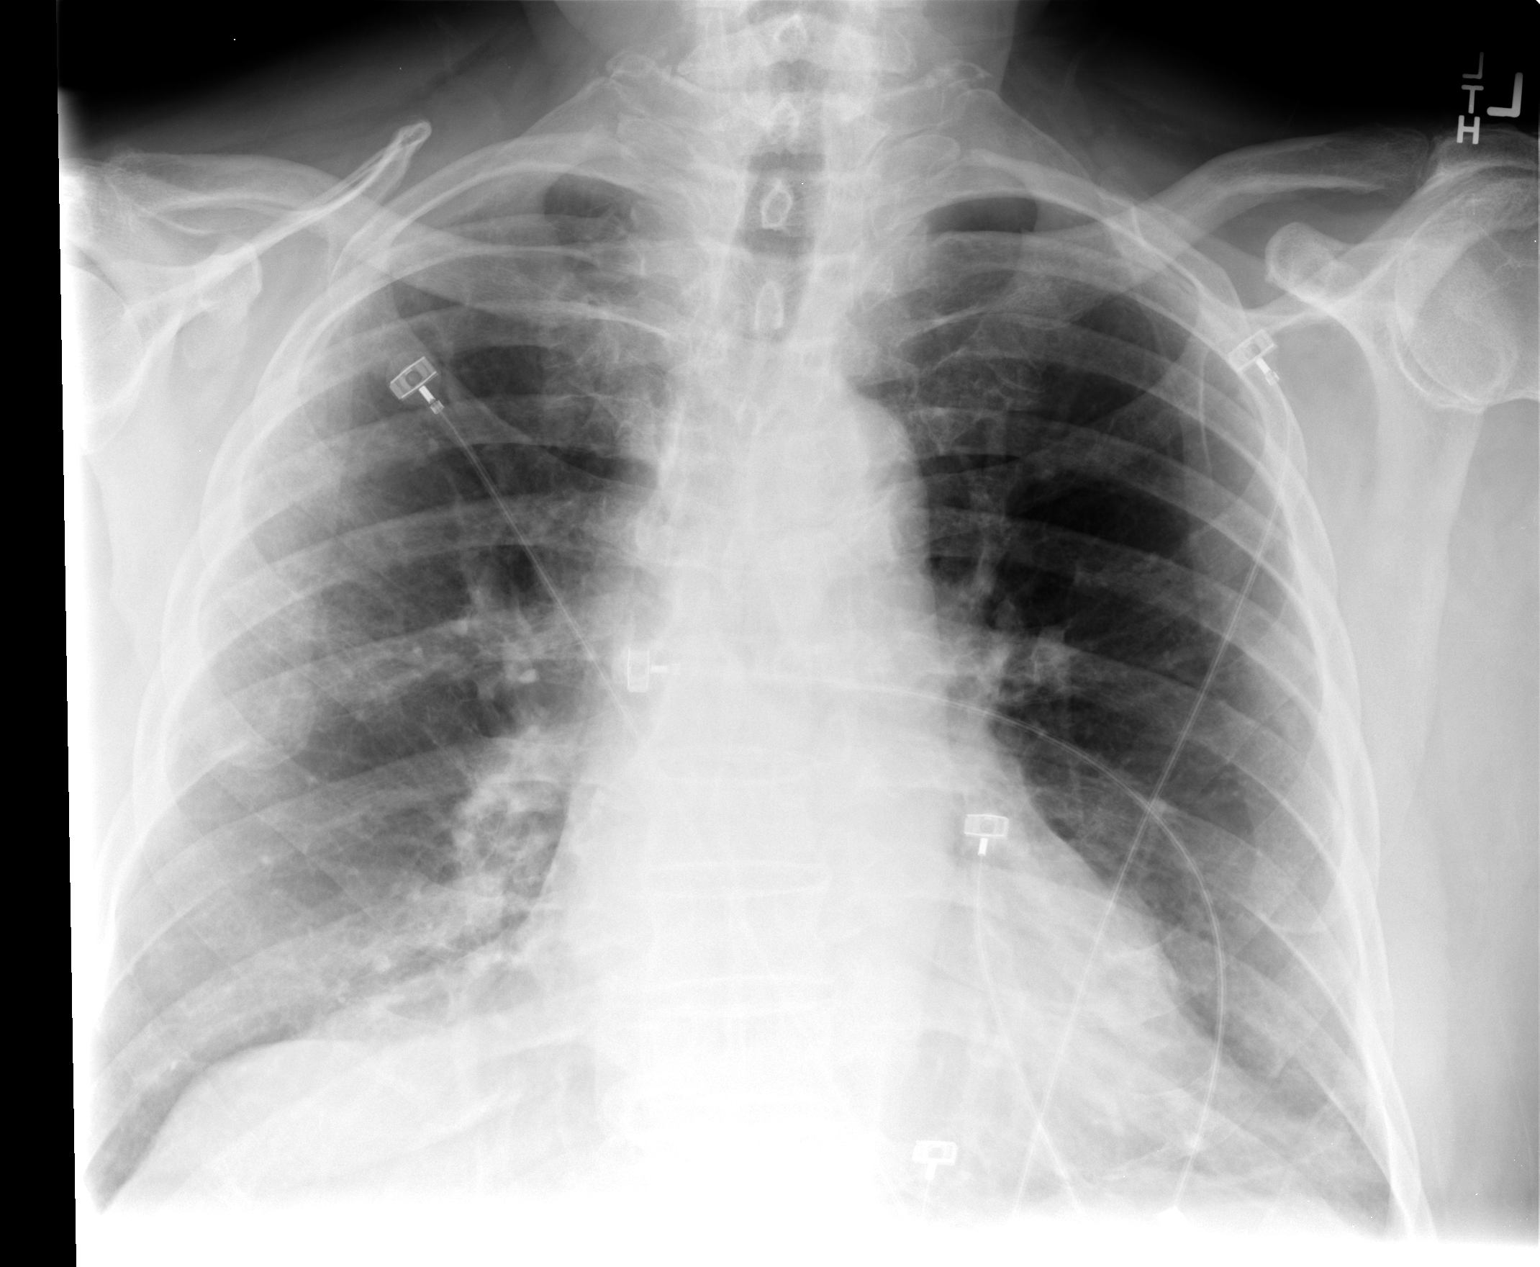

[view not recorded (2 of 2)]
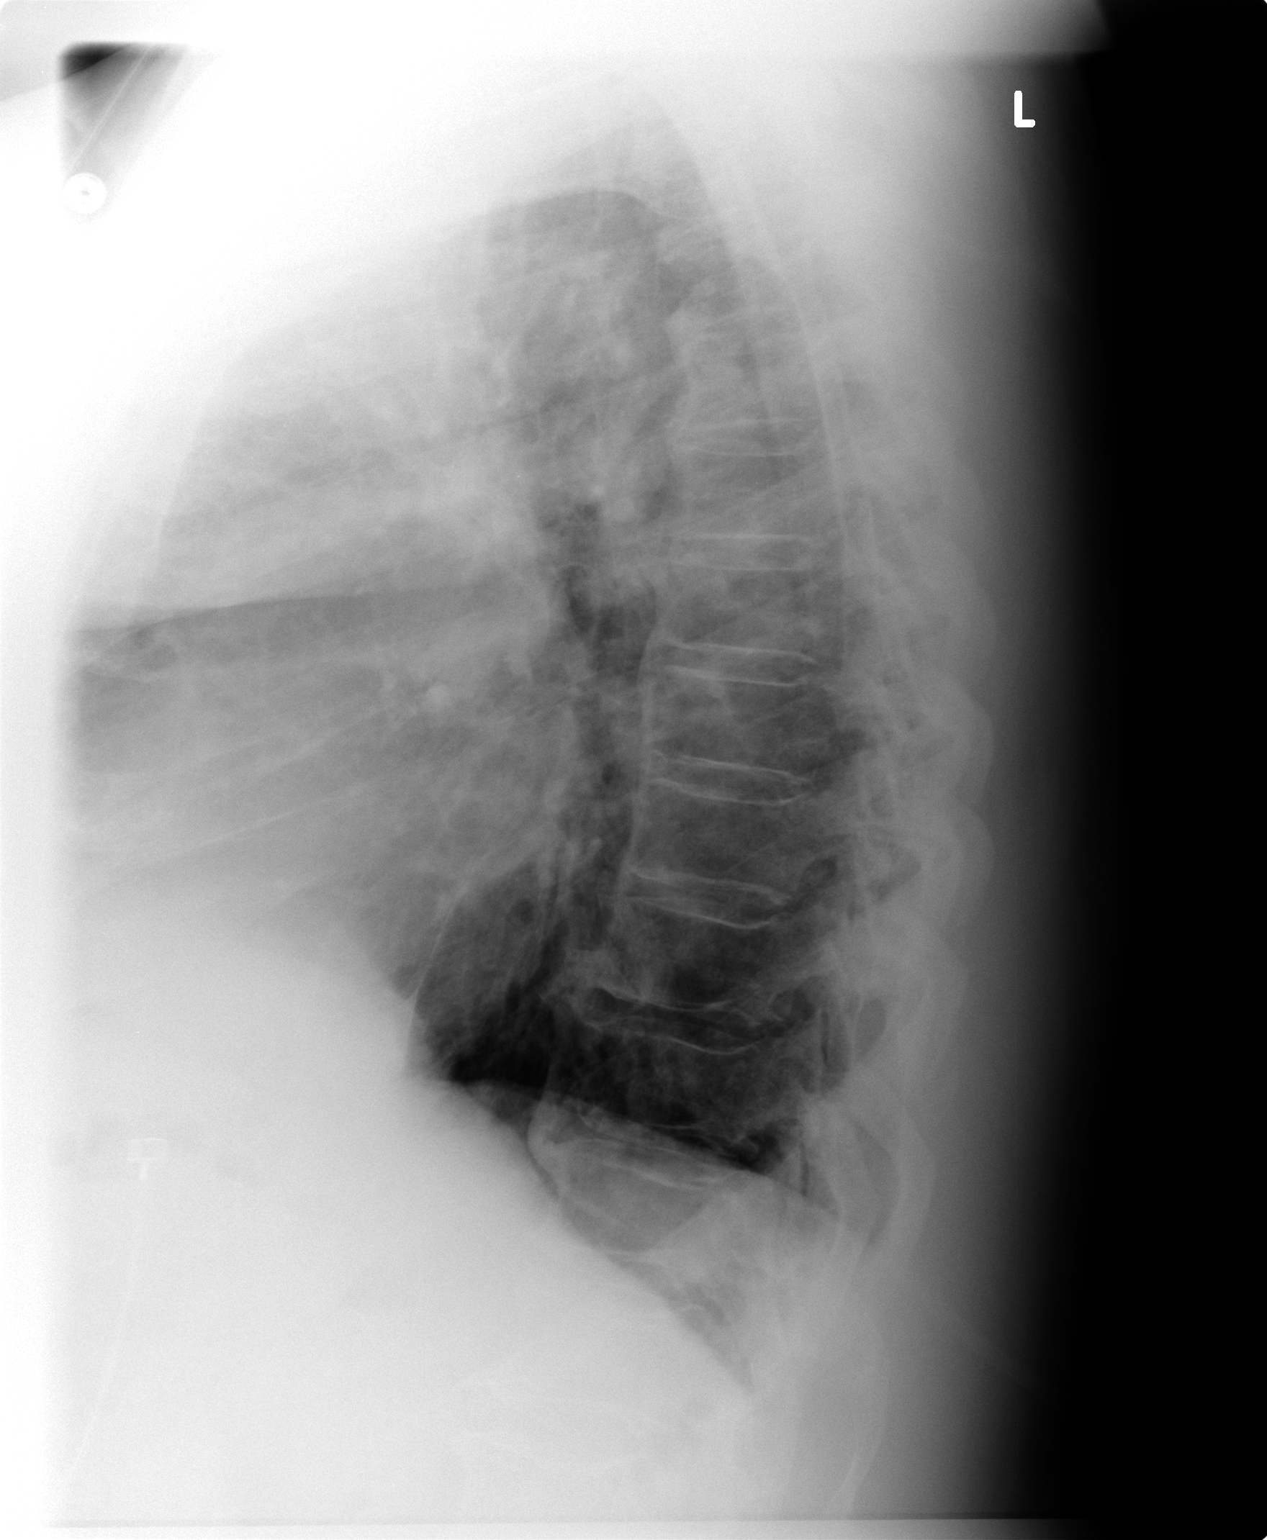

[2 of 2 positions shown; findings below may reference images not displayed]

FINDINGS: The heart size and mediastinal contours are stable. Both lungs are
clear. The visualized skeletal structures are unremarkable.
IMPRESSION: No active disease.

## 2014-08-30 IMAGING — CR DG ABDOMEN 2V
3 series · 3 of 3 positions shown · non-contrast
Comparison: Acute abdomen series 06/19/2013, 09/28/2011. One-view
abdomen x-ray 06/10/2013. CT abdomen and pelvis 09/16/2011.

CLINICAL DATA: Abdominal distention.  Constipation.

EXAM:
ABDOMEN - 2 VIEW

[view not recorded (1 of 3)]
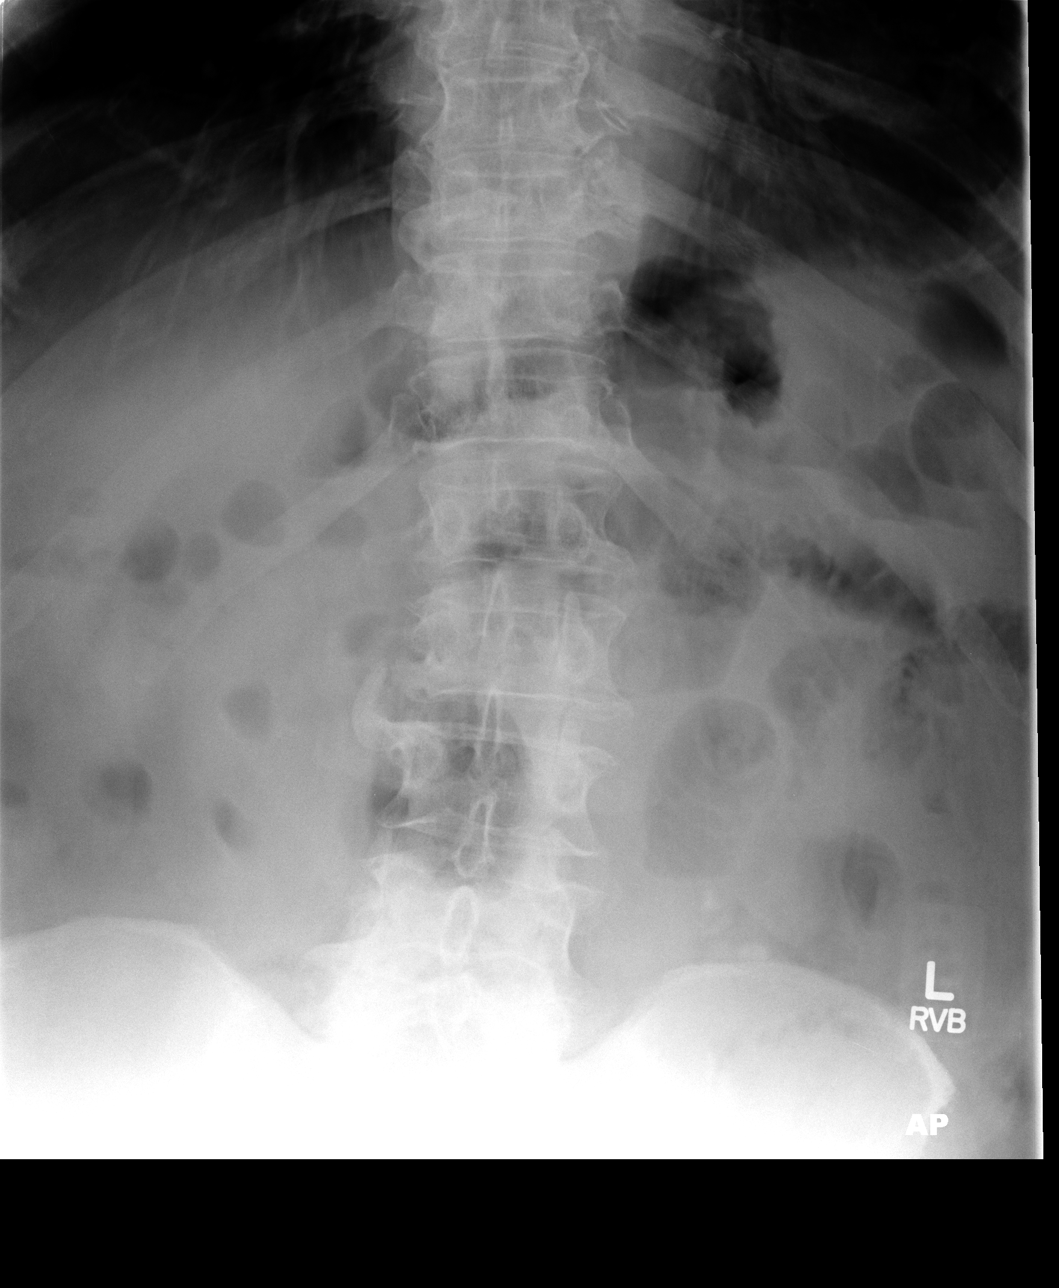

[view not recorded (2 of 3)]
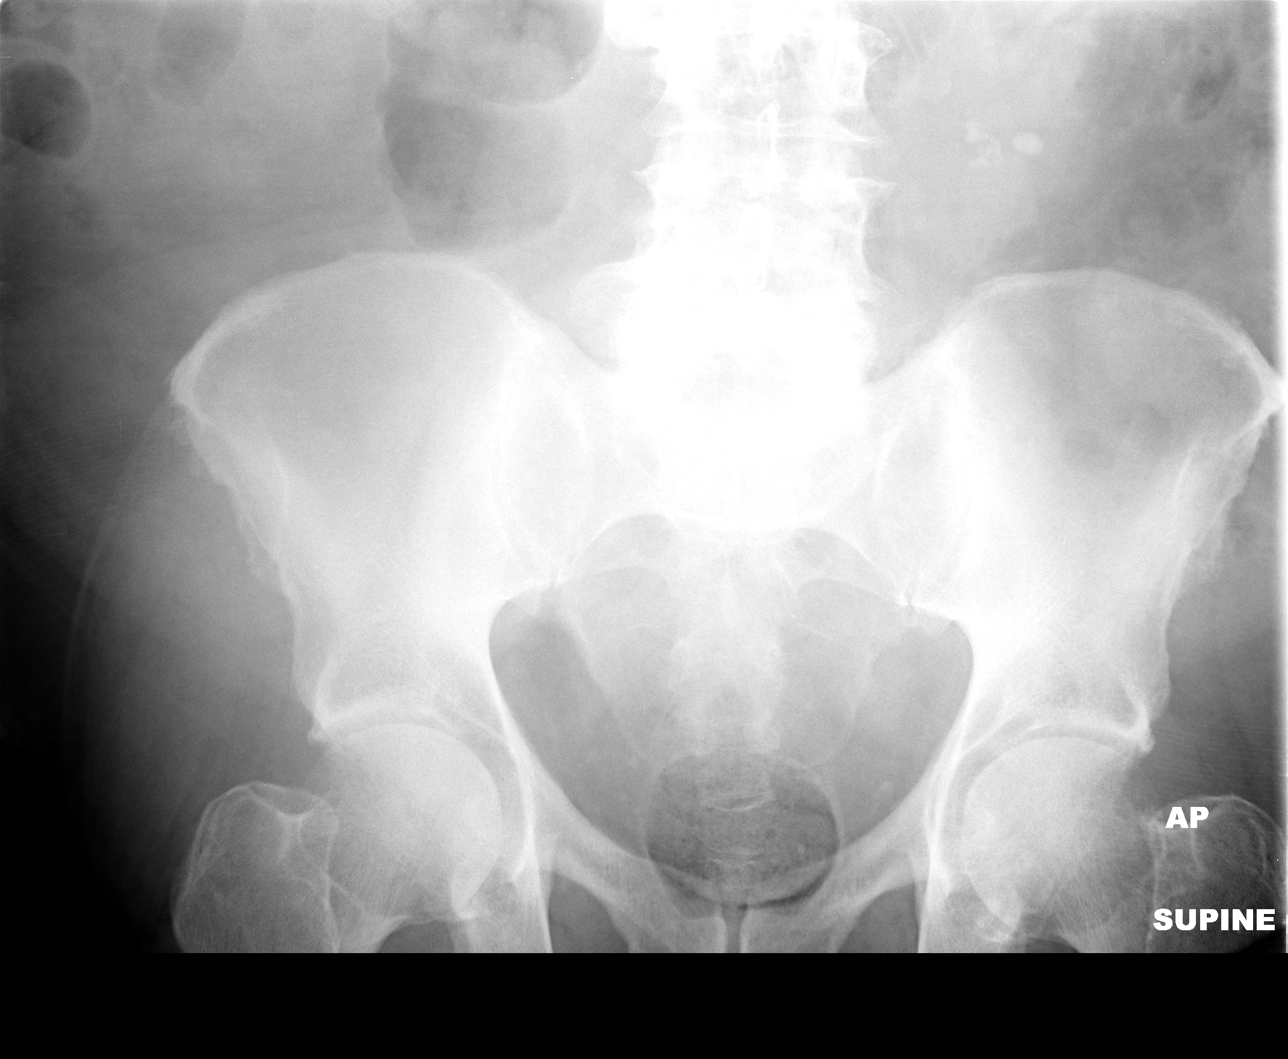

[view not recorded (3 of 3)]
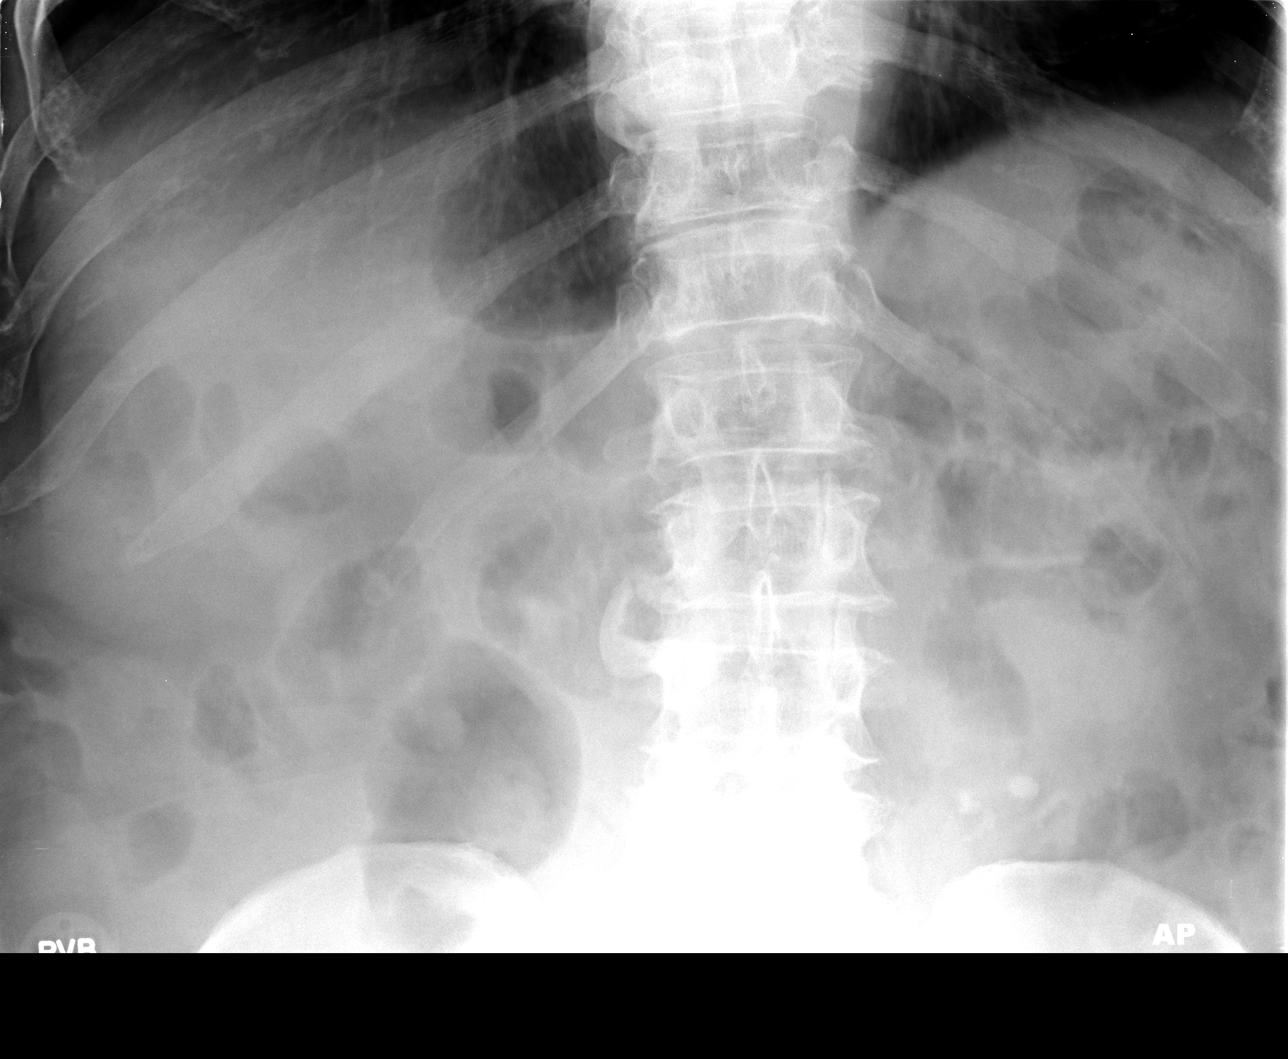

[3 of 3 positions shown; findings below may reference images not displayed]

FINDINGS: Borderline to mild gaseous distention of multiple loops of small
bowel in the left upper quadrant demonstrating air-fluid levels on
the erect image. Expected stool burden. Air- fluid levels in the
colon related to liquid stool. No free intraperitoneal air.

Prior CT demonstrated a horseshoe kidney. Numerous calculi projected
over the expected location of the left side of the horseshoe kidney.
Phleboliths in the pelvis. No visible ureteral calculi. Degenerative
changes involving the lumbar spine.
IMPRESSION: 1. Early partial small bowel obstruction suspected as there are
dilated loops of jejunum in the left upper quadrant demonstrating
air-fluid levels on the erect image.
2. No free intraperitoneal air.
3. Urinary tract calculi within the left side of the previously
identified horseshoe kidney.

## 2014-09-11 ENCOUNTER — Encounter (INDEPENDENT_AMBULATORY_CARE_PROVIDER_SITE_OTHER): Payer: Self-pay | Admitting: *Deleted

## 2014-09-18 ENCOUNTER — Ambulatory Visit (HOSPITAL_COMMUNITY): Payer: Medicare HMO | Admitting: Psychiatry

## 2014-10-01 ENCOUNTER — Ambulatory Visit (INDEPENDENT_AMBULATORY_CARE_PROVIDER_SITE_OTHER): Payer: Medicare HMO | Admitting: Internal Medicine

## 2015-03-14 ENCOUNTER — Ambulatory Visit (HOSPITAL_COMMUNITY)
Admission: RE | Admit: 2015-03-14 | Discharge: 2015-03-14 | Disposition: A | Discharge status: Home / Self Care | Payer: Medicare PPO | Source: Ambulatory Visit | Attending: Family Medicine | Admitting: Family Medicine

## 2015-03-14 ENCOUNTER — Other Ambulatory Visit (HOSPITAL_COMMUNITY): Payer: Self-pay | Admitting: Family Medicine

## 2015-03-14 DIAGNOSIS — E6609 Other obesity due to excess calories: Secondary | ICD-10-CM

## 2015-03-14 DIAGNOSIS — R05 Cough: Secondary | ICD-10-CM | POA: Diagnosis present

## 2015-03-14 DIAGNOSIS — Z0000 Encounter for general adult medical examination without abnormal findings: Secondary | ICD-10-CM

## 2015-09-04 ENCOUNTER — Other Ambulatory Visit: Payer: Self-pay | Admitting: "Endocrinology

## 2015-09-05 MED ORDER — LEVOTHYROXINE SODIUM 137 MCG PO CAPS: 137.0000 ug | ORAL_CAPSULE | Freq: Every day | ORAL | Status: DC

## 2015-09-06 ENCOUNTER — Other Ambulatory Visit: Payer: Self-pay

## 2015-09-26 ENCOUNTER — Other Ambulatory Visit: Payer: Self-pay | Admitting: "Endocrinology

## 2015-09-26 MED ORDER — LEVOTHYROXINE SODIUM 137 MCG PO TABS: 137.0000 ug | ORAL_TABLET | Freq: Every day | ORAL | Status: DC

## 2015-10-08 ENCOUNTER — Other Ambulatory Visit: Payer: Self-pay | Admitting: "Endocrinology

## 2015-10-17 ENCOUNTER — Other Ambulatory Visit: Payer: Self-pay | Admitting: "Endocrinology

## 2015-10-18 LAB — COMPREHENSIVE METABOLIC PANEL
ALBUMIN: 3.8 g/dL (ref 3.5–4.8)
ALT: 11 IU/L (ref 0–44)
AST: 18 IU/L (ref 0–40)
Albumin/Globulin Ratio: 1 — ABNORMAL LOW (ref 1.1–2.5)
Alkaline Phosphatase: 59 IU/L (ref 39–117)
BUN / CREAT RATIO: 17 (ref 10–22)
BUN: 30 mg/dL — AB (ref 8–27)
Bilirubin Total: 0.3 mg/dL (ref 0.0–1.2)
CO2: 27 mmol/L (ref 18–29)
CREATININE: 1.79 mg/dL — AB (ref 0.76–1.27)
Calcium: 8.8 mg/dL (ref 8.6–10.2)
Chloride: 100 mmol/L (ref 97–106)
GFR calc non Af Amer: 35 mL/min/{1.73_m2} — ABNORMAL LOW (ref >59)
GFR, EST AFRICAN AMERICAN: 41 mL/min/{1.73_m2} — AB (ref >59)
GLOBULIN, TOTAL: 3.7 g/dL (ref 1.5–4.5)
GLUCOSE: 49 mg/dL — AB (ref 65–99)
Potassium: 3.1 mmol/L — ABNORMAL LOW (ref 3.5–5.2)
SODIUM: 144 mmol/L (ref 136–144)
TOTAL PROTEIN: 7.5 g/dL (ref 6.0–8.5)

## 2015-10-18 LAB — HGB A1C W/O EAG: Hgb A1c MFr Bld: 8.1 % — ABNORMAL HIGH (ref 4.8–5.6)

## 2015-10-18 LAB — TSH+FREE T4
Free T4: 1.14 ng/dL (ref 0.82–1.77)
TSH: 5.62 u[IU]/mL — ABNORMAL HIGH (ref 0.450–4.500)

## 2015-10-22 ENCOUNTER — Telehealth: Payer: Self-pay | Admitting: Internal Medicine

## 2015-10-22 NOTE — Telephone Encounter (Signed)
RECALL FOR TCS °

## 2015-10-22 NOTE — Telephone Encounter (Signed)
Letter mailed to pt.  

## 2015-10-25 ENCOUNTER — Ambulatory Visit (INDEPENDENT_AMBULATORY_CARE_PROVIDER_SITE_OTHER): Payer: Medicare PPO | Admitting: "Endocrinology

## 2015-10-25 ENCOUNTER — Encounter: Payer: Self-pay | Admitting: "Endocrinology

## 2015-10-25 VITALS — BP 165/77 | HR 82 | Ht 76.0 in | Wt 336.0 lb

## 2015-10-25 DIAGNOSIS — N184 Chronic kidney disease, stage 4 (severe): Secondary | ICD-10-CM

## 2015-10-25 DIAGNOSIS — E038 Other specified hypothyroidism: Secondary | ICD-10-CM

## 2015-10-25 DIAGNOSIS — I10 Essential (primary) hypertension: Secondary | ICD-10-CM

## 2015-10-25 DIAGNOSIS — E1122 Type 2 diabetes mellitus with diabetic chronic kidney disease: Secondary | ICD-10-CM

## 2015-10-25 DIAGNOSIS — Z794 Long term (current) use of insulin: Secondary | ICD-10-CM

## 2015-10-25 MED ORDER — LEVOTHYROXINE SODIUM 137 MCG PO CAPS: 137.0000 ug | ORAL_CAPSULE | Freq: Every day | ORAL | Status: DC

## 2015-10-25 MED ORDER — INSULIN DETEMIR 100 UNIT/ML FLEXPEN: 70.0000 [IU] | PEN_INJECTOR | Freq: Every day | SUBCUTANEOUS | Status: DC

## 2015-10-25 NOTE — Patient Instructions (Signed)

## 2015-10-25 NOTE — Progress Notes (Signed)
Subjective:    Patient ID: Garrett Bradley, male    DOB: 11/09/1934, PCP Colette RibasGOLDING, [name], MD   Past Medical History  Diagnosis Date  . Hypertension   . Diabetes mellitus   . Hypothyroidism   . Depression   . Urinary problem   . Hypokalemia   . Enlarged prostate    Past Surgical History  Procedure Laterality Date  . Hernia repair    . Transurethral resection of prostate    . Transurethral resection of prostate  09/22/2011    Procedure: TRANSURETHRAL RESECTION OF THE PROSTATE (TURP);  Surgeon: Ky BarbanMohammad I Javaid;  Location: [facility];  Service: Urology;  Laterality: N/A;   Social History   Social History  . Marital Status: Married    Spouse Name: N/A  . Number of Children: N/A  . Years of Education: N/A   Social History Main Topics  . Smoking status: Never Smoker   . Smokeless tobacco: Never Used  . Alcohol Use: No  . Drug Use: No  . Sexual Activity: Not Currently   Other Topics Concern  . None   Social History Narrative   Outpatient Encounter Prescriptions as of 10/25/2015  Medication Sig  . ALPRAZolam (XANAX) 1 MG tablet Take 1 mg by mouth at bedtime as needed for sleep.   . clobetasol ointment (TEMOVATE) 0.05 % Apply 1 application topically daily as needed. For legs  . cyanocobalamin 2000 MCG tablet Take 2,000 mcg by mouth daily.  Marland Kitchen. diltiazem (CARDIZEM CD) 300 MG 24 hr capsule Take 300 mg by mouth daily.   . ferrous sulfate 325 (65 FE) MG tablet Take 325 mg by mouth daily.   . fish oil-omega-3 fatty acids 1000 MG capsule Take 1 capsule (1 g total) by mouth 2 (two) times daily.  . furosemide (LASIX) 40 MG tablet Take 40 mg by mouth 3 (three) times daily as needed.  . Insulin Detemir (LEVEMIR FLEXTOUCH) 100 UNIT/ML Pen Inject 70 Units into the skin at bedtime.  . megestrol (MEGACE) 400 MG/10ML suspension Take 10 mLs (400 mg total) by mouth daily.  . metFORMIN (GLUCOPHAGE) 500 MG tablet Take 500 mg by mouth 2 (two) times daily with a meal.  . NOVOLOG FLEXPEN  100 UNIT/ML FlexPen INJECT 10 UNITS SUBCUTANEOUSLY 3 TIMES DAILY  . polyethylene glycol (MIRALAX / GLYCOLAX) packet Take 17 g by mouth daily as needed for mild constipation.  Marland Kitchen. terazosin (HYTRIN) 10 MG capsule Take 10 mg by mouth daily.  . Travoprost, BAK Free, (TRAVATAN) 0.004 % SOLN ophthalmic solution Place 1 drop into both eyes at bedtime.  . [DISCONTINUED] Insulin Detemir (LEVEMIR FLEXTOUCH Forbes) Inject 70 Units into the skin at bedtime.  . [DISCONTINUED] insulin lispro (HUMALOG) 100 UNIT/ML injection Inject 0.03 mLs (3 Units total) into the skin 3 (three) times daily before meals.  . [DISCONTINUED] levothyroxine (SYNTHROID, LEVOTHROID) 150 MCG tablet TAKE ONE TABLET BY MOUTH ONCE DAILY IN THE MORNING  . levothyroxine (SYNTHROID, LEVOTHROID) 137 MCG tablet Take 1 tablet (137 mcg total) by mouth daily before breakfast. (Patient not taking: Reported on 10/25/2015)  . Levothyroxine Sodium 137 MCG CAPS Take 1 capsule (137 mcg total) by mouth daily before breakfast.  . [DISCONTINUED] Levothyroxine Sodium 137 MCG CAPS Take 1 capsule (137 mcg total) by mouth daily before breakfast. (Patient not taking: Reported on 10/25/2015)   No facility-administered encounter medications on file as of 10/25/2015.   ALLERGIES: Allergies  Allergen Reactions  . Thiazide-Type Diuretics     Patient was just  told not to take, not sure if an actual allergy   VACCINATION STATUS:  There is no immunization history on file for this patient.  Diabetes He presents for his follow-up diabetic visit. He has type 2 diabetes mellitus. Onset time: He was diagnosed at approximate age of 28 years. There are no hypoglycemic associated symptoms. There are no diabetic associated symptoms. There are no hypoglycemic complications. Symptoms are worsening. Diabetic complications include nephropathy. Risk factors for coronary artery disease include diabetes mellitus, dyslipidemia, hypertension, male sex, obesity, sedentary lifestyle and  tobacco exposure. Current diabetic treatment includes insulin injections. He is compliant with treatment most of the time. His weight is increasing steadily. He is following a generally unhealthy diet. He has had a previous visit with a dietitian. He never participates in exercise. Home blood sugar record trend: He did not bring the meter nor blood glucose profile to review. An ACE inhibitor/angiotensin II receptor blocker is not being taken. Eye exam is current.  Hyperlipidemia This is a chronic problem. The current episode started more than 1 year ago. Exacerbating diseases include diabetes and hypothyroidism. Current antihyperlipidemic treatment includes bile acid squestrants.  Hypertension This is a chronic problem. The current episode started more than 1 year ago. The problem is uncontrolled. Past treatments include calcium channel blockers. The current treatment provides no improvement. Hypertensive end-organ damage includes kidney disease and a thyroid problem.  Thyroid Problem Presents for follow-up visit. The symptoms have been stable. Past treatments include levothyroxine (He says he ran out of his levothyroxine for 6 weeks.). His past medical history is significant for diabetes and hyperlipidemia.     Review of Systems  Objective:    BP 165/77 mmHg  Pulse 82  Ht  (1.93 m)  Wt 336 lb (152.409 kg)  BMI 40.92 kg/m2  SpO2 94%  Wt Readings from Last 3 Encounters:  10/25/15 336 lb (152.409 kg)  07/28/14 275 lb 8 oz (124.966 kg)  07/09/14 294 lb (133.358 kg)    Physical Exam  Results for orders placed or performed in visit on 10/17/15  TSH + free T4  Result Value Ref Range   TSH 5.620 (H) 0.450 - 4.500 uIU/mL   Free T4 1.14 0.82 - 1.77 ng/dL  Comprehensive metabolic panel  Result Value Ref Range   Glucose 49 (L) 65 - 99 mg/dL   BUN 30 (H) 8 - 27 mg/dL   Creatinine, Ser 6.96 (H) 0.76 - 1.27 mg/dL   GFR calc non Af Amer 35 (L) >59 mL/min/1.73   GFR calc Af Amer 41 (L) >59  mL/min/1.73   BUN/Creatinine Ratio 17 10 - 22   Sodium 144 136 - 144 mmol/L   Potassium 3.1 (L) 3.5 - 5.2 mmol/L   Chloride 100 97 - 106 mmol/L   CO2 27 18 - 29 mmol/L   Calcium 8.8 8.6 - 10.2 mg/dL   Total Protein 7.5 6.0 - 8.5 g/dL   Albumin 3.8 3.5 - 4.8 g/dL   Globulin, Total 3.7 1.5 - 4.5 g/dL   Albumin/Globulin Ratio 1.0 (L) 1.1 - 2.5   Bilirubin Total 0.3 0.0 - 1.2 mg/dL   Alkaline Phosphatase 59 39 - 117 IU/L   AST 18 0 - 40 IU/L   ALT 11 0 - 44 IU/L  Hgb A1c w/o eAG  Result Value Ref Range   Hgb A1c MFr Bld 8.1 (H) 4.8 - 5.6 %   Complete Blood Count (Most recent): Lab Results  Component Value Date   WBC 5.8 07/28/2014  HGB 11.9* 07/28/2014   HCT 36.2* 07/28/2014   MCV 85.8 07/28/2014   PLT 154 07/28/2014   Chemistry (most recent): Lab Results  Component Value Date   NA 144 10/17/2015   K 3.1* 10/17/2015   CL 100 10/17/2015   CO2 27 10/17/2015   BUN 30* 10/17/2015   CREATININE 1.79* 10/17/2015   Diabetic Labs (most recent): Lab Results  Component Value Date   HGBA1C 8.1* 10/17/2015   HGBA1C 6.5* 06/24/2014   HGBA1C 10.6* 02/12/2013   Lipid profile (most recent): No results found for: TRIG, CHOL       Assessment & Plan:   1. Type 2 diabetes mellitus with stage 4 chronic kidney disease, with long-term current use of insulin (HCC)  -His diabetes is  complicated by CK D and patient remains at a high risk for more acute and chronic complications of diabetes which include CAD, CVA, CKD, retinopathy, and neuropathy. These are all discussed in detail with the patient.  Patient came with no meter nor glucose profile, and  recent A1c of 8.1 %.   Recent labs reviewed.   - I have re-counseled the patient on diet management and weight loss  by adopting a carbohydrate restricted / protein rich  Diet.  - Suggestion is made for patient to avoid simple carbohydrates   from their diet including Cakes , Desserts, Ice Cream,  Soda (  diet and regular) , Sweet Tea ,  Candies,  Chips, Cookies, Artificial Sweeteners,   and "Sugar-free" Products .  This will help patient to have stable blood glucose profile and potentially avoid unintended  Weight gain.  - Patient is advised to stick to a routine mealtimes to eat 3 meals  a day and avoid unnecessary snacks ( to snack only to correct hypoglycemia).  - The patient  has been  scheduled with Norm Salt, RDN, CDE for individualized DM education.  - I have approached patient with the following individualized plan to manage diabetes and patient agrees.  -I will continue levemir 70 units qhs, continue Novolog 10 units TIDAC plus correction dose for BG readings above 150. I gave him personalized chart to dose the insulin. He will continue to monitor BG AC and HS and return RTN in 12 weeks with labs, meter, and log book for reevaluation and meds adjustment. His type 2 DM is complicated by CKD , his GFR is 53.  He will be continued on low dose MTF 500mg  po BID.   - Patient specific target  for A1c; LDL, HDL, Triglycerides, and  Waist Circumference were discussed in detail.  2) BP/HTN: Uncontrolled. Continue current medications including. 3) Lipids/HPL: LDL levels unknown, advised him to continue fish oil.  4)  Weight/Diet: CDE consult in progress, exercise, and carbohydrates information provided.  5) hypothyroidism: I advised him to continue levothyroxine 137 g by mouth every morning.  - We discussed about correct intake of levothyroxine, at fasting, with water, separated by at least 30 minutes from breakfast, and separated by more than 4 hours from calcium, iron, multivitamins, acid reflux medications (PPIs). -Patient is made aware of the fact that thyroid hormone replacement is needed for life, dose to be adjusted by periodic monitoring of thyroid function tests.  6) Chronic Care/Health Maintenance:  -Patient is encouraged to continue to follow up with Ophthalmology, Podiatrist at least yearly or according to  recommendations, and advised to  stay away from smoking. I have recommended yearly flu vaccine and pneumonia vaccination at least every 5 years; moderate  intensity exercise for up to 150 minutes weekly; and  sleep for at least 7 hours a day.  - 25 minutes of time was spent on the care of this patient , 50% of which was applied for counseling on diabetes complications and their preventions.  - I advised patient to maintain close follow up with their PCP for primary care needs.  Patient is asked to bring meter and  blood glucose logs during their next visit.   Follow up plan: -Return in about 2 weeks (around 11/08/2015) for diabetes, high blood pressure, underactive thyroid, high cholesterol, follow up with meter and logs- no labs.  Marquis Lunch, MD Phone: [phone]  Fax: [phone]   10/25/2015, 8:26 PM

## 2015-11-11 ENCOUNTER — Ambulatory Visit: Payer: Medicare PPO | Admitting: "Endocrinology

## 2015-11-29 ENCOUNTER — Other Ambulatory Visit: Payer: Self-pay | Admitting: "Endocrinology

## 2016-01-21 DIAGNOSIS — Z1389 Encounter for screening for other disorder: Secondary | ICD-10-CM | POA: Diagnosis not present

## 2016-01-21 DIAGNOSIS — E118 Type 2 diabetes mellitus with unspecified complications: Secondary | ICD-10-CM | POA: Diagnosis not present

## 2016-01-21 DIAGNOSIS — I10 Essential (primary) hypertension: Secondary | ICD-10-CM | POA: Diagnosis not present

## 2016-01-21 DIAGNOSIS — E782 Mixed hyperlipidemia: Secondary | ICD-10-CM | POA: Diagnosis not present

## 2016-01-21 DIAGNOSIS — E1165 Type 2 diabetes mellitus with hyperglycemia: Secondary | ICD-10-CM | POA: Diagnosis not present

## 2016-02-24 ENCOUNTER — Emergency Department (HOSPITAL_COMMUNITY)
Admission: EM | Admit: 2016-02-24 | Discharge: 2016-02-24 | Disposition: A | Discharge status: Home / Self Care | Payer: Medicare Other | Attending: Emergency Medicine | Admitting: Emergency Medicine

## 2016-02-24 ENCOUNTER — Encounter (HOSPITAL_COMMUNITY): Payer: Self-pay | Admitting: Emergency Medicine

## 2016-02-24 DIAGNOSIS — Z7984 Long term (current) use of oral hypoglycemic drugs: Secondary | ICD-10-CM | POA: Diagnosis not present

## 2016-02-24 DIAGNOSIS — N390 Urinary tract infection, site not specified: Secondary | ICD-10-CM | POA: Insufficient documentation

## 2016-02-24 DIAGNOSIS — F329 Major depressive disorder, single episode, unspecified: Secondary | ICD-10-CM | POA: Diagnosis not present

## 2016-02-24 DIAGNOSIS — I10 Essential (primary) hypertension: Secondary | ICD-10-CM | POA: Diagnosis not present

## 2016-02-24 DIAGNOSIS — Z794 Long term (current) use of insulin: Secondary | ICD-10-CM | POA: Insufficient documentation

## 2016-02-24 DIAGNOSIS — E039 Hypothyroidism, unspecified: Secondary | ICD-10-CM | POA: Diagnosis not present

## 2016-02-24 DIAGNOSIS — K5900 Constipation, unspecified: Secondary | ICD-10-CM | POA: Insufficient documentation

## 2016-02-24 DIAGNOSIS — Z79899 Other long term (current) drug therapy: Secondary | ICD-10-CM | POA: Diagnosis not present

## 2016-02-24 DIAGNOSIS — E119 Type 2 diabetes mellitus without complications: Secondary | ICD-10-CM | POA: Insufficient documentation

## 2016-02-24 LAB — POC OCCULT BLOOD, ED: Fecal Occult Bld: NEGATIVE

## 2016-02-24 LAB — URINE MICROSCOPIC-ADD ON

## 2016-02-24 LAB — COMPREHENSIVE METABOLIC PANEL
ALBUMIN: 3.3 g/dL — AB (ref 3.5–5.0)
ALT: 26 U/L (ref 17–63)
AST: 21 U/L (ref 15–41)
Alkaline Phosphatase: 56 U/L (ref 38–126)
Anion gap: 10 (ref 5–15)
BILIRUBIN TOTAL: 0.9 mg/dL (ref 0.3–1.2)
BUN: 21 mg/dL — AB (ref 6–20)
CO2: 28 mmol/L (ref 22–32)
CREATININE: 1.6 mg/dL — AB (ref 0.61–1.24)
Calcium: 9 mg/dL (ref 8.9–10.3)
Chloride: 102 mmol/L (ref 101–111)
GFR calc Af Amer: 45 mL/min — ABNORMAL LOW (ref >60)
GFR calc non Af Amer: 39 mL/min — ABNORMAL LOW (ref >60)
GLUCOSE: 144 mg/dL — AB (ref 65–99)
POTASSIUM: 3.2 mmol/L — AB (ref 3.5–5.1)
Sodium: 140 mmol/L (ref 135–145)
TOTAL PROTEIN: 7.7 g/dL (ref 6.5–8.1)

## 2016-02-24 LAB — CBC WITH DIFFERENTIAL/PLATELET
BASOS ABS: 0 10*3/uL (ref 0.0–0.1)
BASOS PCT: 0 %
Eosinophils Absolute: 0 10*3/uL (ref 0.0–0.7)
Eosinophils Relative: 1 %
HEMATOCRIT: 35.7 % — AB (ref 39.0–52.0)
Hemoglobin: 11.7 g/dL — ABNORMAL LOW (ref 13.0–17.0)
Lymphocytes Relative: 16 %
Lymphs Abs: 1 10*3/uL (ref 0.7–4.0)
MCH: 29.1 pg (ref 26.0–34.0)
MCHC: 32.8 g/dL (ref 30.0–36.0)
MCV: 88.8 fL (ref 78.0–100.0)
MONO ABS: 0.6 10*3/uL (ref 0.1–1.0)
Monocytes Relative: 10 %
NEUTROS ABS: 4.4 10*3/uL (ref 1.7–7.7)
Neutrophils Relative %: 73 %
Platelets: 160 10*3/uL (ref 150–400)
RBC: 4.02 MIL/uL — ABNORMAL LOW (ref 4.22–5.81)
RDW: 14.6 % (ref 11.5–15.5)
WBC: 6 10*3/uL (ref 4.0–10.5)

## 2016-02-24 LAB — URINALYSIS, ROUTINE W REFLEX MICROSCOPIC
BILIRUBIN URINE: NEGATIVE
Glucose, UA: NEGATIVE mg/dL
KETONES UR: NEGATIVE mg/dL
NITRITE: POSITIVE — AB
PROTEIN: 30 mg/dL — AB
Specific Gravity, Urine: 1.01 (ref 1.005–1.030)
pH: 6.5 (ref 5.0–8.0)

## 2016-02-24 LAB — PROTIME-INR
INR: 1.19 (ref 0.00–1.49)
Prothrombin Time: 15.3 seconds — ABNORMAL HIGH (ref 11.6–15.2)

## 2016-02-24 MED ORDER — CEPHALEXIN 500 MG PO CAPS
500.0000 mg | ORAL_CAPSULE | Freq: Once | ORAL | Status: AC
  Administered 2016-02-24: 500 mg via ORAL
  Filled 2016-02-24: qty 1

## 2016-02-24 MED ORDER — POLYETHYLENE GLYCOL 3350 17 GM/SCOOP PO POWD: 17.0000 g | Freq: Two times a day (BID) | ORAL | Status: DC

## 2016-02-24 MED ORDER — CEPHALEXIN 500 MG PO CAPS: 500.0000 mg | ORAL_CAPSULE | Freq: Four times a day (QID) | ORAL | Status: DC

## 2016-02-24 NOTE — ED Notes (Signed)
Patient informed a urine sample is needed at this time. 

## 2016-02-24 NOTE — Discharge Instructions (Signed)

## 2016-02-24 NOTE — ED Provider Notes (Signed)
CSN: [id number]     Arrival date & time 02/24/16  1054 History   First MD Initiated Contact with Patient 02/24/16 1120     Chief Complaint  Patient presents with  . Constipation     (Consider location/radiation/quality/duration/timing/severity/associated sxs/prior Treatment) HPI Comments: The patient is an 82 year old patient who has a history of regular stools, approximately every 2 or 3 days. A couple of weeks ago he developed frequent stools one or 2 a day, over the last week he has had infrequent stools and now feels as though his abdomen is tight, he cannot have a bowel movement, he took a laxative without relief. He denies any urinary symptoms though he does state that he has to self catheterize to get a urine sample. There is no fevers, no nausea or vomiting, no back pain. The symptoms are progressive, gradually worsening, not associated with "abdominal pain"  Patient is a 82 y.o. male presenting with constipation. The history is provided by the patient.  Constipation   Past Medical History  Diagnosis Date  . Hypertension   . Diabetes mellitus   . Hypothyroidism   . Depression   . Urinary problem   . Hypokalemia   . Enlarged prostate    Past Surgical History  Procedure Laterality Date  . Hernia repair    . Transurethral resection of prostate    . Transurethral resection of prostate  09/22/2011    Procedure: TRANSURETHRAL RESECTION OF THE PROSTATE (TURP);  Surgeon: Ky BarbanMohammad I Javaid;  Location: [facility];  Service: Urology;  Laterality: N/A;   Family History  Problem Relation Age of Onset  . Hypertension Mother   . Hypertension Brother   . Cerebral aneurysm Mother    Social History  Substance Use Topics  . Smoking status: Never Smoker   . Smokeless tobacco: Never Used  . Alcohol Use: No    Review of Systems  Gastrointestinal: Positive for constipation.  All other systems reviewed and are negative.     Allergies  Thiazide-type diuretics  Home Medications    Prior to Admission medications   Medication Sig Start Date End Date Taking? Authorizing Provider  ALPRAZolam Prudy Feeler(XANAX) 1 MG tablet Take 1 mg by mouth at bedtime as needed for sleep.  10/02/11  Yes Christiane Haorinna L Sullivan, MD  clobetasol ointment (TEMOVATE) 0.05 % Apply 1 application topically daily as needed. For legs 02/09/14  Yes Historical Provider, MD  cyanocobalamin 2000 MCG tablet Take 2,000 mcg by mouth daily.   Yes Historical Provider, MD  diltiazem (CARDIZEM CD) 300 MG 24 hr capsule Take 300 mg by mouth daily.    Yes Historical Provider, MD  ferrous sulfate 325 (65 FE) MG tablet Take 325 mg by mouth daily.  10/17/11  Yes Vassie Lollarlos Madera, MD  fish oil-omega-3 fatty acids 1000 MG capsule Take 1 capsule (1 g total) by mouth 2 (two) times daily. 10/02/11  Yes Christiane Haorinna L Sullivan, MD  furosemide (LASIX) 40 MG tablet Take 40 mg by mouth 3 (three) times daily as needed.   Yes Historical Provider, MD  Insulin Detemir (LEVEMIR FLEXTOUCH) 100 UNIT/ML Pen Inject 70 Units into the skin at bedtime. 10/25/15  Yes Roma KayserGebreselassie W Nida, MD  Levothyroxine Sodium 137 MCG CAPS Take 1 capsule (137 mcg total) by mouth daily before breakfast. 10/25/15  Yes Roma KayserGebreselassie W Nida, MD  megestrol (MEGACE) 400 MG/10ML suspension Take 10 mLs (400 mg total) by mouth daily. 07/29/14  Yes Standley Brookinganiel P Goodrich, MD  metFORMIN (GLUCOPHAGE) 500 MG tablet Take 500 mg  by mouth 2 (two) times daily with a meal.   Yes Historical Provider, MD  NOVOLOG FLEXPEN 100 UNIT/ML FlexPen INJECT 10 UNITS SUBCUTANEOUSLY THREE TIMES DAILY 12/03/15  Yes Roma Kayser, MD  terazosin (HYTRIN) 10 MG capsule Take 10 mg by mouth daily. 01/29/14  Yes Historical Provider, MD  Travoprost, BAK Free, (TRAVATAN) 0.004 % SOLN ophthalmic solution Place 1 drop into both eyes at bedtime.   Yes Historical Provider, MD  cephALEXin (KEFLEX) 500 MG capsule Take 1 capsule (500 mg total) by mouth 4 (four) times daily. 02/24/16   Eber Hong, MD  polyethylene glycol  powder (GLYCOLAX/MIRALAX) powder Take 17 g by mouth 2 (two) times daily. Until daily soft stools  OTC 02/24/16   Eber Hong, MD  Potassium Chloride ER 20 MEQ TBCR Take 1 tablet by mouth 2 (two) times daily. 12/26/15   Historical Provider, MD   BP 152/88 mmHg  Pulse 62  Temp(Src) 98.2 F (36.8 C) (Oral)  Resp 21  Ht  (1.93 m)  Wt 310 lb (140.615 kg)  BMI 37.75 kg/m2  SpO2 98% Physical Exam  Constitutional: He appears well-developed and well-nourished. No distress.  HENT:  Head: Normocephalic and atraumatic.  Mouth/Throat: Oropharynx is clear and moist. No oropharyngeal exudate.  Eyes: Conjunctivae and EOM are normal. Pupils are equal, round, and reactive to light. Right eye exhibits no discharge. Left eye exhibits no discharge. No scleral icterus.  Neck: Normal range of motion. Neck supple. No JVD present. No thyromegaly present.  Cardiovascular: Normal rate, regular rhythm, normal heart sounds and intact distal pulses.  Exam reveals no gallop and no friction rub.   No murmur heard. Regular rhythm with occasional ectopy  Pulmonary/Chest: Effort normal and breath sounds normal. No respiratory distress. He has no wheezes. He has no rales.  Abdominal: Soft. Bowel sounds are normal. He exhibits distension. He exhibits no mass. There is no tenderness.  No tympanitic sounds to percussion, no tenderness, the patient is very obese  Musculoskeletal: Normal range of motion. He exhibits no edema or tenderness.  Lymphadenopathy:    He has no cervical adenopathy.  Neurological: He is alert. Coordination normal.  Skin: Skin is warm and dry. No rash noted. No erythema.  Psychiatric: He has a normal mood and affect. His behavior is normal.  Nursing note and vitals reviewed.   ED Course  Procedures (including critical care time) Labs Review Labs Reviewed  CBC WITH DIFFERENTIAL/PLATELET - Abnormal; Notable for the following:    RBC 4.02 (*)    Hemoglobin 11.7 (*)    HCT 35.7 (*)    All  other components within normal limits  COMPREHENSIVE METABOLIC PANEL - Abnormal; Notable for the following:    Potassium 3.2 (*)    Glucose, Bld 144 (*)    BUN 21 (*)    Creatinine, Ser 1.60 (*)    Albumin 3.3 (*)    GFR calc non Af Amer 39 (*)    GFR calc Af Amer 45 (*)    All other components within normal limits  PROTIME-INR - Abnormal; Notable for the following:    Prothrombin Time 15.3 (*)    All other components within normal limits  URINALYSIS, ROUTINE W REFLEX MICROSCOPIC (NOT AT [facility]) - Abnormal; Notable for the following:    Hgb urine dipstick LARGE (*)    Protein, ur 30 (*)    Nitrite POSITIVE (*)    Leukocytes, UA LARGE (*)    All other components within normal limits  URINE  MICROSCOPIC-ADD ON - Abnormal; Notable for the following:    Squamous Epithelial / LPF 0-5 (*)    Bacteria, UA MANY (*)    All other components within normal limits  URINE CULTURE  POC OCCULT BLOOD, ED    Imaging Review No results found. I have personally reviewed and evaluated these images and lab results as part of my medical decision-making.   EKG Interpretation   Date/Time:  Monday February 24 2016 11:23:21 EDT Ventricular Rate:  95 PR Interval:  220 QRS Duration: 173 QT Interval:  406 QTC Calculation: 510 R Axis:   -95 Text Interpretation:  Normal sinus rhythm Ventricular trigeminy Borderline  prolonged PR interval RBBB and LAFB Baseline wander in lead(s) V4 V5 V6  Since last tracing ectopy now present Confirmed by Dajanee Voorheis  MD, Gwendolyn Mclees  [phone]) on 02/24/2016 11:53:30 AM      MDM   Final diagnoses:  UTI (lower urinary tract infection)  Constipation, unspecified constipation type    The patient has no signs of cardiac illness, EKG was obtained secondary to a heart rate varying but no chest pain or shortness of breath. This was not ordered by the physician. The patient does have some abdominal distention with a feeling of constipation, he is reporting having dark stools, we'll check  Hemoccult, labs, the patient has a benign abdomen at this time.    Eber Hong, MD 02/24/16 [phone]

## 2016-02-24 NOTE — ED Notes (Signed)
Pt reports constipation. States his last bowel movement was on Friday. Pt states he has been using OTC medications with no relief. Denies any pain or urinary symptoms.

## 2016-02-24 NOTE — ED Notes (Signed)
Pt's HR fluctuated from 35 to 95 on dinamap. Same results when palpated manually.

## 2016-02-26 LAB — URINE CULTURE

## 2016-03-11 ENCOUNTER — Emergency Department (HOSPITAL_COMMUNITY): Payer: Medicare Other

## 2016-03-11 ENCOUNTER — Emergency Department (HOSPITAL_COMMUNITY)
Admission: EM | Admit: 2016-03-11 | Discharge: 2016-03-11 | Disposition: A | Discharge status: Home / Self Care | Payer: Medicare Other | Attending: Emergency Medicine | Admitting: Emergency Medicine

## 2016-03-11 ENCOUNTER — Encounter (HOSPITAL_COMMUNITY): Payer: Self-pay | Admitting: Emergency Medicine

## 2016-03-11 DIAGNOSIS — E119 Type 2 diabetes mellitus without complications: Secondary | ICD-10-CM | POA: Insufficient documentation

## 2016-03-11 DIAGNOSIS — R109 Unspecified abdominal pain: Secondary | ICD-10-CM

## 2016-03-11 DIAGNOSIS — F329 Major depressive disorder, single episode, unspecified: Secondary | ICD-10-CM | POA: Diagnosis not present

## 2016-03-11 DIAGNOSIS — K5900 Constipation, unspecified: Secondary | ICD-10-CM | POA: Insufficient documentation

## 2016-03-11 DIAGNOSIS — E039 Hypothyroidism, unspecified: Secondary | ICD-10-CM | POA: Diagnosis not present

## 2016-03-11 DIAGNOSIS — Z7984 Long term (current) use of oral hypoglycemic drugs: Secondary | ICD-10-CM | POA: Insufficient documentation

## 2016-03-11 DIAGNOSIS — Z794 Long term (current) use of insulin: Secondary | ICD-10-CM | POA: Insufficient documentation

## 2016-03-11 DIAGNOSIS — K8020 Calculus of gallbladder without cholecystitis without obstruction: Secondary | ICD-10-CM | POA: Diagnosis not present

## 2016-03-11 DIAGNOSIS — Z79899 Other long term (current) drug therapy: Secondary | ICD-10-CM | POA: Insufficient documentation

## 2016-03-11 DIAGNOSIS — I10 Essential (primary) hypertension: Secondary | ICD-10-CM | POA: Insufficient documentation

## 2016-03-11 DIAGNOSIS — N200 Calculus of kidney: Secondary | ICD-10-CM | POA: Diagnosis not present

## 2016-03-11 LAB — URINALYSIS, ROUTINE W REFLEX MICROSCOPIC
BILIRUBIN URINE: NEGATIVE
Glucose, UA: NEGATIVE mg/dL
Ketones, ur: 15 mg/dL — AB
Nitrite: NEGATIVE
PH: 8.5 — AB (ref 5.0–8.0)
Protein, ur: 100 mg/dL — AB
SPECIFIC GRAVITY, URINE: 1.01 (ref 1.005–1.030)

## 2016-03-11 LAB — CBC WITH DIFFERENTIAL/PLATELET
Basophils Absolute: 0 10*3/uL (ref 0.0–0.1)
Basophils Relative: 0 %
EOS PCT: 0 %
Eosinophils Absolute: 0 10*3/uL (ref 0.0–0.7)
HEMATOCRIT: 41.4 % (ref 39.0–52.0)
Hemoglobin: 13.2 g/dL (ref 13.0–17.0)
LYMPHS ABS: 1.3 10*3/uL (ref 0.7–4.0)
LYMPHS PCT: 18 %
MCH: 29.1 pg (ref 26.0–34.0)
MCHC: 31.9 g/dL (ref 30.0–36.0)
MCV: 91.2 fL (ref 78.0–100.0)
Monocytes Absolute: 0.6 10*3/uL (ref 0.1–1.0)
Monocytes Relative: 8 %
NEUTROS ABS: 5.6 10*3/uL (ref 1.7–7.7)
Neutrophils Relative %: 74 %
PLATELETS: 165 10*3/uL (ref 150–400)
RBC: 4.54 MIL/uL (ref 4.22–5.81)
RDW: 15.4 % (ref 11.5–15.5)
WBC: 7.6 10*3/uL (ref 4.0–10.5)

## 2016-03-11 LAB — COMPREHENSIVE METABOLIC PANEL
ALBUMIN: 3.6 g/dL (ref 3.5–5.0)
ALK PHOS: 57 U/L (ref 38–126)
ALT: 56 U/L (ref 17–63)
ANION GAP: 13 (ref 5–15)
AST: 37 U/L (ref 15–41)
BILIRUBIN TOTAL: 1.1 mg/dL (ref 0.3–1.2)
BUN: 40 mg/dL — AB (ref 6–20)
CALCIUM: 9.1 mg/dL (ref 8.9–10.3)
CO2: 28 mmol/L (ref 22–32)
CREATININE: 1.97 mg/dL — AB (ref 0.61–1.24)
Chloride: 110 mmol/L (ref 101–111)
GFR calc Af Amer: 35 mL/min — ABNORMAL LOW (ref >60)
GFR calc non Af Amer: 30 mL/min — ABNORMAL LOW (ref >60)
GLUCOSE: 145 mg/dL — AB (ref 65–99)
Potassium: 3.4 mmol/L — ABNORMAL LOW (ref 3.5–5.1)
Sodium: 151 mmol/L — ABNORMAL HIGH (ref 135–145)
TOTAL PROTEIN: 8.5 g/dL — AB (ref 6.5–8.1)

## 2016-03-11 LAB — URINE MICROSCOPIC-ADD ON

## 2016-03-11 LAB — LIPASE, BLOOD: Lipase: 28 U/L (ref 11–51)

## 2016-03-11 MED ORDER — FLEET ENEMA 7-19 GM/118ML RE ENEM
1.0000 | ENEMA | Freq: Once | RECTAL | Status: AC
  Administered 2016-03-11: 1 via RECTAL

## 2016-03-11 MED ORDER — MILK AND MOLASSES ENEMA
1.0000 | Freq: Once | RECTAL | Status: DC
  Filled 2016-03-11: qty 250

## 2016-03-11 MED ORDER — SENNA 8.6 MG PO TABS: 1.0000 | ORAL_TABLET | Freq: Every day | ORAL | Status: DC

## 2016-03-11 MED ORDER — MILK AND MOLASSES ENEMA
1.0000 | Freq: Once | RECTAL | Status: AC
  Administered 2016-03-11: 250 mL via RECTAL
  Filled 2016-03-11: qty 250

## 2016-03-11 MED ORDER — FLEET ENEMA 7-19 GM/118ML RE ENEM: 1.0000 | ENEMA | Freq: Once | RECTAL | Status: DC

## 2016-03-11 MED ORDER — MAGNESIUM CITRATE PO SOLN: 1.0000 | Freq: Once | ORAL | Status: DC

## 2016-03-11 MED ORDER — SODIUM CHLORIDE 0.9 % IV BOLUS (SEPSIS)
500.0000 mL | Freq: Once | INTRAVENOUS | Status: AC
  Administered 2016-03-11: 500 mL via INTRAVENOUS

## 2016-03-11 NOTE — ED Notes (Signed)
Pt states he has not had a bowel movement in 4 days. Pt has been taking stool softeners and miralax with out results. Pt state stomach hurting and get tight after her eats.

## 2016-03-11 NOTE — Discharge Instructions (Signed)
Your CT scan does not show anything serious today, just constipation. Continue to take your stool softener and miralax as prescribed. You are also given a new agent to start using. Start taking senna, and if you do not have a bowel movement in the next few days, take a bottle of the magnesium citrate. Follow-up very closely with your PCP. Return for worsening symptoms including worsening pain, fevers, vomiting and unable to keep down food or fluids, or any other symptoms concerning to you.  Constipation, Adult Constipation is when a person:  Poops (has a bowel movement) less than 3 times a week.  Has a hard time pooping.  Has poop that is dry, hard, or bigger than normal. HOME CARE   Eat foods with a lot of fiber in them. This includes fruits, vegetables, beans, and whole grains such as brown rice.  Avoid fatty foods and foods with a lot of sugar. This includes french fries, hamburgers, cookies, candy, and soda.  If you are not getting enough fiber from food, take products with added fiber in them (supplements).  Drink enough fluid to keep your pee (urine) clear or pale yellow.  Exercise on a regular basis, or as told by your doctor.  Go to the restroom when you feel like you need to poop. Do not hold it.  Only take medicine as told by your doctor. Do not take medicines that help you poop (laxatives) without talking to your doctor first. GET HELP RIGHT AWAY IF:   You have bright red blood in your poop (stool).  Your constipation lasts more than 4 days or gets worse.  You have belly (abdominal) or butt (rectal) pain.  You have thin poop (as thin as a pencil).  You lose weight, and it cannot be explained. MAKE SURE YOU:   Understand these instructions.  Will watch your condition.  Will get help right away if you are not doing well or get worse.   This information is not intended to replace advice given to you by your health care provider. Make sure you discuss any questions  you have with your health care provider.   Document Released: 05/11/2008 Document Revised: 12/14/2014 Document Reviewed: 09/04/2013 Elsevier Interactive Patient Education [employer]2016 [employer]

## 2016-03-11 NOTE — ED Notes (Signed)
Pt had medium results after enema, EDP notified.

## 2016-03-11 NOTE — ED Provider Notes (Signed)
CSN: [id number]     Arrival date & time 03/11/16  1358 History   First MD Initiated Contact with Patient 03/11/16 1813     Chief Complaint  Patient presents with  . Constipation     (Consider location/radiation/quality/duration/timing/severity/associated sxs/prior Treatment) HPI 82 year old male who presents with abdominal pain and constipation. History of hypertension, diabetes, and hernia repair. Was seen in the emergency department at the end of March for constipation and has been taking MiraLAX and Colace since then. Has only had 1 bowel movement during this period of time, which is 4 days ago. He has noted some abdominal distention and bloating after eating as well as intermittent abdominal cramping. No nausea or vomiting, chest pain or difficulty breathing, fevers or chills, dysuria or urinary frequency. Past Medical History  Diagnosis Date  . Hypertension   . Diabetes mellitus   . Hypothyroidism   . Depression   . Urinary problem   . Hypokalemia   . Enlarged prostate    Past Surgical History  Procedure Laterality Date  . Hernia repair    . Transurethral resection of prostate    . Transurethral resection of prostate  09/22/2011    Procedure: TRANSURETHRAL RESECTION OF THE PROSTATE (TURP);  Surgeon: Ky Barban;  Location: [facility];  Service: Urology;  Laterality: N/A;   Family History  Problem Relation Age of Onset  . Hypertension Mother   . Hypertension Brother   . Cerebral aneurysm Mother    Social History  Substance Use Topics  . Smoking status: Never Smoker   . Smokeless tobacco: Never Used  . Alcohol Use: No    Review of Systems 10/14 systems reviewed and are negative other than those stated in the HPI   Allergies  Thiazide-type diuretics  Home Medications   Prior to Admission medications   Medication Sig Start Date End Date Taking? Authorizing Provider  cyanocobalamin 2000 MCG tablet Take 2,000 mcg by mouth daily.   Yes Historical Provider, MD   diltiazem (CARDIZEM CD) 300 MG 24 hr capsule Take 300 mg by mouth daily.    Yes Historical Provider, MD  ferrous sulfate 325 (65 FE) MG tablet Take 325 mg by mouth daily.  10/17/11  Yes Vassie Loll, MD  fish oil-omega-3 fatty acids 1000 MG capsule Take 1 capsule (1 g total) by mouth 2 (two) times daily. 10/02/11  Yes Christiane Ha, MD  furosemide (LASIX) 40 MG tablet Take 40 mg by mouth 3 (three) times daily as needed.   Yes Historical Provider, MD  Insulin Detemir (LEVEMIR FLEXTOUCH) 100 UNIT/ML Pen Inject 70 Units into the skin at bedtime. 10/25/15  Yes Roma Kayser, MD  levothyroxine (SYNTHROID, LEVOTHROID) 137 MCG tablet Take 137 mcg by mouth daily. 01/30/16  Yes Historical Provider, MD  metFORMIN (GLUCOPHAGE) 500 MG tablet Take 500 mg by mouth 2 (two) times daily with a meal.   Yes Historical Provider, MD  NOVOLOG FLEXPEN 100 UNIT/ML FlexPen INJECT 10 UNITS SUBCUTANEOUSLY THREE TIMES DAILY 12/03/15  Yes Roma Kayser, MD  polyethylene glycol powder (GLYCOLAX/MIRALAX) powder Take 17 g by mouth 2 (two) times daily. Until daily soft stools  OTC 02/24/16  Yes Eber Hong, MD  Potassium Chloride ER 20 MEQ TBCR Take 1 tablet by mouth 2 (two) times daily. 12/26/15  Yes Historical Provider, MD  terazosin (HYTRIN) 10 MG capsule Take 10 mg by mouth daily. 01/29/14  Yes Historical Provider, MD  Travoprost, BAK Free, (TRAVATAN) 0.004 % SOLN ophthalmic solution Place 1 drop into  both eyes at bedtime.   Yes Historical Provider, MD  ALPRAZolam Prudy Feeler) 1 MG tablet Take 1 mg by mouth at bedtime as needed for sleep.  10/02/11   Christiane Ha, MD  cephALEXin (KEFLEX) 500 MG capsule Take 1 capsule (500 mg total) by mouth 4 (four) times daily. Patient not taking: Reported on 03/11/2016 02/24/16   Eber Hong, MD  clobetasol ointment (TEMOVATE) 0.05 % Apply 1 application topically daily as needed. For legs 02/09/14   Historical Provider, MD  Levothyroxine Sodium 137 MCG CAPS Take 1 capsule  (137 mcg total) by mouth daily before breakfast. Patient not taking: Reported on 03/11/2016 10/25/15   Roma Kayser, MD  magnesium citrate SOLN Take 296 mLs (1 Bottle total) by mouth once. 03/11/16   Lavera Guise, MD  megestrol (MEGACE) 400 MG/10ML suspension Take 10 mLs (400 mg total) by mouth daily. 07/29/14   Standley Brooking, MD  senna (SENOKOT) 8.6 MG TABS tablet Take 1 tablet (8.6 mg total) by mouth daily. 03/11/16   Lavera Guise, MD   BP 184/75 mmHg  Pulse 84  Temp(Src) 98.5 F (36.9 C) (Oral)  Resp 22  Ht  (1.93 m)  Wt 290 lb (131.543 kg)  BMI 35.31 kg/m2  SpO2 92% Physical Exam Physical Exam  Nursing note and vitals reviewed. Constitutional: Well developed, well nourished, non-toxic, and in no acute distress Head: Normocephalic and atraumatic.  Mouth/Throat: Oropharynx is clear and moist.  Neck: Normal range of motion. Neck supple.  Cardiovascular: Normal rate and regular rhythm.   Pulmonary/Chest: Effort normal and breath sounds normal.  Abdominal: Soft. There is mild upper abdominal tenderness. There is no rebound and no guarding. No fecal impaction Musculoskeletal: Normal range of motion.  Neurological: Alert, no facial droop, fluent speech, moves all extremities symmetrically Skin: Skin is warm and dry.  Psychiatric: Cooperative   ED Course  Procedures (including critical care time) Labs Review Labs Reviewed  URINALYSIS, ROUTINE W REFLEX MICROSCOPIC (NOT AT [facility]) - Abnormal; Notable for the following:    pH 8.5 (*)    Hgb urine dipstick SMALL (*)    Ketones, ur 15 (*)    Protein, ur 100 (*)    Leukocytes, UA MODERATE (*)    All other components within normal limits  COMPREHENSIVE METABOLIC PANEL - Abnormal; Notable for the following:    Sodium 151 (*)    Potassium 3.4 (*)    Glucose, Bld 145 (*)    BUN 40 (*)    Creatinine, Ser 1.97 (*)    Total Protein 8.5 (*)    GFR calc non Af Amer 30 (*)    GFR calc Af Amer 35 (*)    All other components  within normal limits  URINE MICROSCOPIC-ADD ON - Abnormal; Notable for the following:    Squamous Epithelial / LPF 0-5 (*)    Bacteria, UA MANY (*)    Casts GRANULAR CAST (*)    Crystals TRIPLE PHOSPHATE CRYSTALS (*)    All other components within normal limits  CBC WITH DIFFERENTIAL/PLATELET  LIPASE, BLOOD    Imaging Review Ct Abdomen Pelvis Wo Contrast  03/11/2016  CLINICAL DATA:  Abdominal pain.  No bowel movements for 4 days. EXAM: CT ABDOMEN AND PELVIS WITHOUT CONTRAST TECHNIQUE: Multidetector CT imaging of the abdomen and pelvis was performed following the standard protocol without IV contrast. COMPARISON:  07/09/2014 FINDINGS: No evidence of bowel obstruction. Oral contrast has progressed through to the ascending colon. Small bowel and stomach  are unremarkable. There is a mildly generous volume of colonic stool, without evidence of colonic obstruction. There are unremarkable unenhanced appearances of the liver. There are multiple calculi within the gallbladder lumen, measuring approximately 7 mm. No bile duct dilatation. The spleen is unremarkable. There probably is a 1.4 cm cystic lesion of the pancreatic tail and this is not significantly changed from 07/09/2014 for 09/16/2011. The pancreas is otherwise unremarkable. The adrenals are unremarkable. There is a horseshoe kidney. There are multiple collecting system calculi. The largest right sided calculus measures 5 mm. The largest left-sided calculus measures 1.5 cm, enlarged from 2015 when it measured 8 mm. There are no ureteral calculi. The urinary bladder is decompressed and cannot be evaluated. The abdominal aorta is normal in caliber with mild atherosclerotic calcification. No significant abnormality is evident in the lower chest. No significant musculoskeletal lesion is evident. Moderately severe degenerative lumbar facet disease is present from L4 through S1. IMPRESSION: 1. No acute findings are evident in the abdomen or pelvis. 2. There  is a generous volume of colonic stool, without evidence of obstruction. 3. Cholelithiasis. 4. Nephrolithiasis.  Horseshoe kidney. 5. Small cystic lesion of the pancreatic tail, unchanged from 2012. Electronically Signed   By: Ellery Plunkaniel R Mitchell M.D.   On: 03/11/2016 21:14   I have personally reviewed and evaluated these images and lab results as part of my medical decision-making.   EKG Interpretation None      MDM   Final diagnoses:  Constipation, unspecified constipation type    82 year old male with history of hernia repair, hypertension diabetes who presents with abdominal pain and constipation. He is nontoxic in no acute distress with non-concerning vital signs on presentation. Abdomen is soft and nonsurgical. With some mild abdominal tenderness in the upper abdomen. No evidence of fecal impaction. Second visit for constipation, and today with mild abdominal pain. Thus, a CT abdomen and pelvis was performed. No acute intra-abdominal processes. He did receive a enema here and was able to have a moderate size bowel movement and feels symptomatically improved. He was asked to continue MiraLAX and Colace at home, and given prescriptions for senna and magnesium citrate as needed. He will follow up very closely with his primary care provider for repeat evaluation and ongoing management.    Lavera Guiseana Duo Luciana Cammarata, MD 03/11/16 [phone]

## 2016-03-11 NOTE — ED Notes (Signed)
Pt had no results with fleet enema, edp notified and milk of molasses enema given.

## 2016-03-16 ENCOUNTER — Emergency Department (HOSPITAL_COMMUNITY): Payer: Medicare Other

## 2016-03-16 ENCOUNTER — Inpatient Hospital Stay (HOSPITAL_COMMUNITY)
Admission: EM | Admit: 2016-03-16 | Discharge: 2016-03-23 | DRG: 682 | Disposition: A | Discharge status: Skilled Nursing Facility | Payer: Medicare Other | Attending: Family Medicine | Admitting: Family Medicine

## 2016-03-16 ENCOUNTER — Encounter (HOSPITAL_COMMUNITY): Payer: Self-pay

## 2016-03-16 DIAGNOSIS — R600 Localized edema: Secondary | ICD-10-CM | POA: Diagnosis not present

## 2016-03-16 DIAGNOSIS — Z6834 Body mass index (BMI) 34.0-34.9, adult: Secondary | ICD-10-CM

## 2016-03-16 DIAGNOSIS — Z8249 Family history of ischemic heart disease and other diseases of the circulatory system: Secondary | ICD-10-CM | POA: Diagnosis not present

## 2016-03-16 DIAGNOSIS — E861 Hypovolemia: Secondary | ICD-10-CM | POA: Diagnosis present

## 2016-03-16 DIAGNOSIS — E860 Dehydration: Secondary | ICD-10-CM | POA: Diagnosis present

## 2016-03-16 DIAGNOSIS — E039 Hypothyroidism, unspecified: Secondary | ICD-10-CM | POA: Diagnosis present

## 2016-03-16 DIAGNOSIS — F329 Major depressive disorder, single episode, unspecified: Secondary | ICD-10-CM | POA: Diagnosis not present

## 2016-03-16 DIAGNOSIS — R52 Pain, unspecified: Secondary | ICD-10-CM

## 2016-03-16 DIAGNOSIS — F32A Depression, unspecified: Secondary | ICD-10-CM | POA: Diagnosis present

## 2016-03-16 DIAGNOSIS — E669 Obesity, unspecified: Secondary | ICD-10-CM | POA: Diagnosis present

## 2016-03-16 DIAGNOSIS — N400 Enlarged prostate without lower urinary tract symptoms: Secondary | ICD-10-CM | POA: Diagnosis present

## 2016-03-16 DIAGNOSIS — R262 Difficulty in walking, not elsewhere classified: Secondary | ICD-10-CM | POA: Diagnosis not present

## 2016-03-16 DIAGNOSIS — Z9079 Acquired absence of other genital organ(s): Secondary | ICD-10-CM | POA: Diagnosis not present

## 2016-03-16 DIAGNOSIS — E876 Hypokalemia: Secondary | ICD-10-CM | POA: Diagnosis not present

## 2016-03-16 DIAGNOSIS — B964 Proteus (mirabilis) (morganii) as the cause of diseases classified elsewhere: Secondary | ICD-10-CM | POA: Diagnosis present

## 2016-03-16 DIAGNOSIS — I951 Orthostatic hypotension: Secondary | ICD-10-CM | POA: Diagnosis not present

## 2016-03-16 DIAGNOSIS — Z794 Long term (current) use of insulin: Secondary | ICD-10-CM

## 2016-03-16 DIAGNOSIS — E870 Hyperosmolality and hypernatremia: Secondary | ICD-10-CM | POA: Diagnosis present

## 2016-03-16 DIAGNOSIS — N138 Other obstructive and reflux uropathy: Secondary | ICD-10-CM | POA: Diagnosis not present

## 2016-03-16 DIAGNOSIS — N401 Enlarged prostate with lower urinary tract symptoms: Secondary | ICD-10-CM | POA: Diagnosis not present

## 2016-03-16 DIAGNOSIS — I129 Hypertensive chronic kidney disease with stage 1 through stage 4 chronic kidney disease, or unspecified chronic kidney disease: Secondary | ICD-10-CM | POA: Diagnosis not present

## 2016-03-16 DIAGNOSIS — E1122 Type 2 diabetes mellitus with diabetic chronic kidney disease: Secondary | ICD-10-CM | POA: Diagnosis present

## 2016-03-16 DIAGNOSIS — N179 Acute kidney failure, unspecified: Secondary | ICD-10-CM | POA: Diagnosis not present

## 2016-03-16 DIAGNOSIS — I10 Essential (primary) hypertension: Secondary | ICD-10-CM | POA: Diagnosis not present

## 2016-03-16 DIAGNOSIS — D649 Anemia, unspecified: Secondary | ICD-10-CM | POA: Diagnosis present

## 2016-03-16 DIAGNOSIS — R404 Transient alteration of awareness: Secondary | ICD-10-CM | POA: Diagnosis not present

## 2016-03-16 DIAGNOSIS — M109 Gout, unspecified: Secondary | ICD-10-CM | POA: Diagnosis present

## 2016-03-16 DIAGNOSIS — N390 Urinary tract infection, site not specified: Secondary | ICD-10-CM | POA: Diagnosis present

## 2016-03-16 DIAGNOSIS — D6959 Other secondary thrombocytopenia: Secondary | ICD-10-CM | POA: Diagnosis not present

## 2016-03-16 DIAGNOSIS — N183 Chronic kidney disease, stage 3 unspecified: Secondary | ICD-10-CM | POA: Diagnosis present

## 2016-03-16 DIAGNOSIS — R278 Other lack of coordination: Secondary | ICD-10-CM | POA: Diagnosis not present

## 2016-03-16 DIAGNOSIS — R627 Adult failure to thrive: Secondary | ICD-10-CM | POA: Diagnosis present

## 2016-03-16 DIAGNOSIS — K5900 Constipation, unspecified: Secondary | ICD-10-CM | POA: Diagnosis present

## 2016-03-16 DIAGNOSIS — E118 Type 2 diabetes mellitus with unspecified complications: Secondary | ICD-10-CM | POA: Diagnosis not present

## 2016-03-16 DIAGNOSIS — Q631 Lobulated, fused and horseshoe kidney: Secondary | ICD-10-CM | POA: Diagnosis not present

## 2016-03-16 DIAGNOSIS — R531 Weakness: Secondary | ICD-10-CM | POA: Diagnosis not present

## 2016-03-16 DIAGNOSIS — E43 Unspecified severe protein-calorie malnutrition: Secondary | ICD-10-CM | POA: Diagnosis not present

## 2016-03-16 DIAGNOSIS — D696 Thrombocytopenia, unspecified: Secondary | ICD-10-CM | POA: Diagnosis present

## 2016-03-16 DIAGNOSIS — M25531 Pain in right wrist: Secondary | ICD-10-CM | POA: Diagnosis not present

## 2016-03-16 DIAGNOSIS — M6281 Muscle weakness (generalized): Secondary | ICD-10-CM | POA: Diagnosis not present

## 2016-03-16 DIAGNOSIS — M7989 Other specified soft tissue disorders: Secondary | ICD-10-CM | POA: Diagnosis not present

## 2016-03-16 DIAGNOSIS — F4321 Adjustment disorder with depressed mood: Secondary | ICD-10-CM | POA: Diagnosis not present

## 2016-03-16 LAB — CBC
HCT: 44.2 % (ref 39.0–52.0)
Hemoglobin: 14 g/dL (ref 13.0–17.0)
MCH: 29 pg (ref 26.0–34.0)
MCHC: 31.7 g/dL (ref 30.0–36.0)
MCV: 91.5 fL (ref 78.0–100.0)
PLATELETS: 124 10*3/uL — AB (ref 150–400)
RBC: 4.83 MIL/uL (ref 4.22–5.81)
RDW: 15.9 % — ABNORMAL HIGH (ref 11.5–15.5)
WBC: 9.1 10*3/uL (ref 4.0–10.5)

## 2016-03-16 LAB — COMPREHENSIVE METABOLIC PANEL
ALBUMIN: 3.5 g/dL (ref 3.5–5.0)
ALT: 42 U/L (ref 17–63)
AST: 44 U/L — AB (ref 15–41)
Alkaline Phosphatase: 53 U/L (ref 38–126)
Anion gap: 13 (ref 5–15)
BUN: 65 mg/dL — AB (ref 6–20)
CHLORIDE: 118 mmol/L — AB (ref 101–111)
CO2: 27 mmol/L (ref 22–32)
Calcium: 9 mg/dL (ref 8.9–10.3)
Creatinine, Ser: 2.45 mg/dL — ABNORMAL HIGH (ref 0.61–1.24)
GFR calc Af Amer: 27 mL/min — ABNORMAL LOW (ref >60)
GFR, EST NON AFRICAN AMERICAN: 23 mL/min — AB (ref >60)
Glucose, Bld: 181 mg/dL — ABNORMAL HIGH (ref 65–99)
POTASSIUM: 3.4 mmol/L — AB (ref 3.5–5.1)
SODIUM: 158 mmol/L — AB (ref 135–145)
Total Bilirubin: 1 mg/dL (ref 0.3–1.2)
Total Protein: 8.4 g/dL — ABNORMAL HIGH (ref 6.5–8.1)

## 2016-03-16 LAB — CBC WITH DIFFERENTIAL/PLATELET
Basophils Absolute: 0 10*3/uL (ref 0.0–0.1)
Basophils Relative: 0 %
EOS PCT: 1 %
Eosinophils Absolute: 0.1 10*3/uL (ref 0.0–0.7)
HCT: 42.7 % (ref 39.0–52.0)
HEMOGLOBIN: 13.5 g/dL (ref 13.0–17.0)
LYMPHS ABS: 1.2 10*3/uL (ref 0.7–4.0)
LYMPHS PCT: 12 %
MCH: 29.2 pg (ref 26.0–34.0)
MCHC: 31.6 g/dL (ref 30.0–36.0)
MCV: 92.4 fL (ref 78.0–100.0)
MONOS PCT: 7 %
Monocytes Absolute: 0.7 10*3/uL (ref 0.1–1.0)
Neutro Abs: 7.5 10*3/uL (ref 1.7–7.7)
Neutrophils Relative %: 80 %
PLATELETS: 141 10*3/uL — AB (ref 150–400)
RBC: 4.62 MIL/uL (ref 4.22–5.81)
RDW: 15.8 % — ABNORMAL HIGH (ref 11.5–15.5)
WBC: 9.5 10*3/uL (ref 4.0–10.5)

## 2016-03-16 LAB — TSH: TSH: 2.658 u[IU]/mL (ref 0.350–4.500)

## 2016-03-16 LAB — GLUCOSE, CAPILLARY
Glucose-Capillary: 164 mg/dL — ABNORMAL HIGH (ref 65–99)
Glucose-Capillary: 170 mg/dL — ABNORMAL HIGH (ref 65–99)

## 2016-03-16 LAB — CREATININE, SERUM
Creatinine, Ser: 2.33 mg/dL — ABNORMAL HIGH (ref 0.61–1.24)
GFR calc Af Amer: 29 mL/min — ABNORMAL LOW (ref >60)
GFR calc non Af Amer: 25 mL/min — ABNORMAL LOW (ref >60)

## 2016-03-16 LAB — ETHANOL: Alcohol, Ethyl (B): <5 mg/dL (ref <5)

## 2016-03-16 MED ORDER — POTASSIUM CHLORIDE CRYS ER 20 MEQ PO TBCR
20.0000 meq | EXTENDED_RELEASE_TABLET | Freq: Two times a day (BID) | ORAL | Status: DC
  Administered 2016-03-16 – 2016-03-19 (×6): 20 meq via ORAL
  Filled 2016-03-16 (×6): qty 1

## 2016-03-16 MED ORDER — POLYETHYLENE GLYCOL 3350 17 GM/SCOOP PO POWD: 17.0000 g | Freq: Two times a day (BID) | ORAL | Status: DC

## 2016-03-16 MED ORDER — VITAMIN B-12 1000 MCG PO TABS
2000.0000 ug | ORAL_TABLET | Freq: Every day | ORAL | Status: DC
Start: 2016-03-16 — End: 2016-03-23
  Administered 2016-03-16 – 2016-03-21 (×6): 2000 ug via ORAL
  Filled 2016-03-16 (×8): qty 2

## 2016-03-16 MED ORDER — CITALOPRAM HYDROBROMIDE 20 MG PO TABS
20.0000 mg | ORAL_TABLET | Freq: Every day | ORAL | Status: DC
Start: 2016-03-16 — End: 2016-03-23
  Administered 2016-03-16 – 2016-03-23 (×7): 20 mg via ORAL
  Filled 2016-03-16 (×7): qty 1

## 2016-03-16 MED ORDER — ALPRAZOLAM 1 MG PO TABS
1.0000 mg | ORAL_TABLET | Freq: Every evening | ORAL | Status: DC | PRN
  Filled 2016-03-16 (×2): qty 1

## 2016-03-16 MED ORDER — LEVOTHYROXINE SODIUM 25 MCG PO TABS
137.0000 ug | ORAL_TABLET | Freq: Every day | ORAL | Status: DC
  Administered 2016-03-17 – 2016-03-21 (×5): 137 ug via ORAL
  Filled 2016-03-16 (×6): qty 1

## 2016-03-16 MED ORDER — LATANOPROST 0.005 % OP SOLN
1.0000 [drp] | Freq: Every day | OPHTHALMIC | Status: DC
  Administered 2016-03-16 – 2016-03-22 (×7): 1 [drp] via OPHTHALMIC
  Filled 2016-03-16: qty 2.5

## 2016-03-16 MED ORDER — ACETAMINOPHEN 325 MG PO TABS
650.0000 mg | ORAL_TABLET | Freq: Four times a day (QID) | ORAL | Status: DC | PRN
  Administered 2016-03-22: 650 mg via ORAL
  Filled 2016-03-16: qty 2

## 2016-03-16 MED ORDER — DILTIAZEM HCL ER COATED BEADS 120 MG PO CP24
300.0000 mg | ORAL_CAPSULE | Freq: Every day | ORAL | Status: DC
  Administered 2016-03-16 – 2016-03-19 (×4): 300 mg via ORAL
  Filled 2016-03-16 (×5): qty 1

## 2016-03-16 MED ORDER — LATANOPROST 0.005 % OP SOLN
OPHTHALMIC | Status: AC
  Filled 2016-03-16: qty 2.5

## 2016-03-16 MED ORDER — SODIUM CHLORIDE 0.9 % IV BOLUS (SEPSIS)
1000.0000 mL | Freq: Once | INTRAVENOUS | Status: AC
  Administered 2016-03-16: 1000 mL via INTRAVENOUS

## 2016-03-16 MED ORDER — POTASSIUM CL IN DEXTROSE 5% 20 MEQ/L IV SOLN
20.0000 meq | INTRAVENOUS | Status: DC
  Administered 2016-03-16: 20 meq via INTRAVENOUS

## 2016-03-16 MED ORDER — SENNA 8.6 MG PO TABS
1.0000 | ORAL_TABLET | Freq: Every day | ORAL | Status: DC
  Administered 2016-03-16 – 2016-03-21 (×6): 8.6 mg via ORAL
  Filled 2016-03-16 (×8): qty 1

## 2016-03-16 MED ORDER — INSULIN ASPART 100 UNIT/ML ~~LOC~~ SOLN
0.0000 [IU] | Freq: Three times a day (TID) | SUBCUTANEOUS | Status: DC
Start: 2016-03-17 — End: 2016-03-23
  Administered 2016-03-17: 2 [IU] via SUBCUTANEOUS
  Administered 2016-03-17 (×2): 3 [IU] via SUBCUTANEOUS
  Administered 2016-03-18: 2 [IU] via SUBCUTANEOUS
  Administered 2016-03-18 – 2016-03-19 (×5): 3 [IU] via SUBCUTANEOUS
  Administered 2016-03-20: 2 [IU] via SUBCUTANEOUS
  Administered 2016-03-20: 3 [IU] via SUBCUTANEOUS
  Administered 2016-03-20: 1 [IU] via SUBCUTANEOUS
  Administered 2016-03-21 – 2016-03-22 (×3): 2 [IU] via SUBCUTANEOUS
  Administered 2016-03-22: 1 [IU] via SUBCUTANEOUS
  Administered 2016-03-23 (×3): 2 [IU] via SUBCUTANEOUS

## 2016-03-16 MED ORDER — INSULIN ASPART 100 UNIT/ML ~~LOC~~ SOLN
0.0000 [IU] | Freq: Every day | SUBCUTANEOUS | Status: DC
  Administered 2016-03-17: 3 [IU] via SUBCUTANEOUS
  Administered 2016-03-19: 2 [IU] via SUBCUTANEOUS

## 2016-03-16 MED ORDER — POLYETHYLENE GLYCOL 3350 17 G PO PACK
17.0000 g | PACK | Freq: Two times a day (BID) | ORAL | Status: DC
  Administered 2016-03-16 – 2016-03-23 (×11): 17 g via ORAL
  Filled 2016-03-16 (×13): qty 1

## 2016-03-16 MED ORDER — ONDANSETRON HCL 4 MG/2ML IJ SOLN
4.0000 mg | Freq: Four times a day (QID) | INTRAMUSCULAR | Status: DC | PRN
  Administered 2016-03-18 (×2): 4 mg via INTRAVENOUS
  Filled 2016-03-16 (×3): qty 2

## 2016-03-16 MED ORDER — TERAZOSIN HCL 5 MG PO CAPS
10.0000 mg | ORAL_CAPSULE | Freq: Every day | ORAL | Status: DC
  Administered 2016-03-16 – 2016-03-19 (×4): 10 mg via ORAL
  Filled 2016-03-16 (×5): qty 2

## 2016-03-16 MED ORDER — INSULIN DETEMIR 100 UNIT/ML ~~LOC~~ SOLN
10.0000 [IU] | Freq: Every day | SUBCUTANEOUS | Status: DC
  Administered 2016-03-16 – 2016-03-22 (×7): 10 [IU] via SUBCUTANEOUS
  Filled 2016-03-16 (×8): qty 0.1

## 2016-03-16 MED ORDER — ONDANSETRON HCL 4 MG PO TABS: 4.0000 mg | ORAL_TABLET | Freq: Four times a day (QID) | ORAL | Status: DC | PRN

## 2016-03-16 MED ORDER — ACETAMINOPHEN 650 MG RE SUPP: 650.0000 mg | Freq: Four times a day (QID) | RECTAL | Status: DC | PRN

## 2016-03-16 MED ORDER — MEGESTROL ACETATE 400 MG/10ML PO SUSP
400.0000 mg | Freq: Every day | ORAL | Status: DC
  Administered 2016-03-16 – 2016-03-21 (×6): 400 mg via ORAL
  Filled 2016-03-16 (×8): qty 10

## 2016-03-16 MED ORDER — FERROUS SULFATE 325 (65 FE) MG PO TABS
325.0000 mg | ORAL_TABLET | Freq: Every day | ORAL | Status: DC
  Administered 2016-03-17 – 2016-03-21 (×5): 325 mg via ORAL
  Filled 2016-03-16 (×7): qty 1

## 2016-03-16 MED ORDER — SODIUM CHLORIDE 0.9 % IV BOLUS (SEPSIS)
500.0000 mL | Freq: Once | INTRAVENOUS | Status: AC
Start: 2016-03-16 — End: 2016-03-17
  Administered 2016-03-17: 500 mL via INTRAVENOUS

## 2016-03-16 MED ORDER — ENOXAPARIN SODIUM 40 MG/0.4ML ~~LOC~~ SOLN
40.0000 mg | SUBCUTANEOUS | Status: DC
  Administered 2016-03-16 – 2016-03-17 (×2): 40 mg via SUBCUTANEOUS
  Filled 2016-03-16 (×2): qty 0.4

## 2016-03-16 MED ORDER — MILK AND MOLASSES ENEMA
1.0000 | Freq: Once | RECTAL | Status: AC
  Administered 2016-03-16: 250 mL via RECTAL

## 2016-03-16 NOTE — BH Assessment (Signed)
Per Vernona RiegerLaura, NP - patient does not meet criteria for inpatient hospitalization. CSW will fax outpatient resources for the patient.  Writer spoke with the ER MD regarding the patients disposition.

## 2016-03-16 NOTE — H&P (Signed)
[facility] History and Physical  THOMPSON MCKIM ZOX:[id number] DOB: 1933-12-15 DOA: 03/16/2016  Referring physician: Dr. Judd Lien PCP: Colette Ribas, MD   Chief Complaint: weakness  HPI: Garrett Bradley is a 82 y.o. male who is brought into the ER by his family due to constipation and weakness. Patient was seen in the ER earlier this week for constipation. He was treated with an enema and did have a bowel movement prior to discharge. He reports continued constipation, decreased by mouth intake. He says he becomes full very quickly after trying to eat. Denies any vomiting. No fever, cough or shortness of breath. He has chronic bladder outlet obstruction and self catheterizes at home. Family reports that he has not been eating well for over 3 weeks now. Patient does admit to feeling more depressed. He would normally enjoy watching NASCAR, but has not watched any races and quite some time. He reports losing his wife in September 2016. Since that time he feels that things have gone downhill. He has not contacted his physicians regarding any symptoms of depression. Denies any suicidal or homicidal ideations.  Evaluation the emergency room shows worsening renal failure and hypernatremia. He's been referred for admission.   Review of Systems:  Pertinent positives as per HPI, otherwise negative  Past Medical History  Diagnosis Date  . Hypertension   . Diabetes mellitus   . Hypothyroidism   . Depression   . Urinary problem   . Hypokalemia   . Enlarged prostate    Past Surgical History  Procedure Laterality Date  . Hernia repair    . Transurethral resection of prostate    . Transurethral resection of prostate  09/22/2011    Procedure: TRANSURETHRAL RESECTION OF THE PROSTATE (TURP);  Surgeon: Ky Barban;  Location: [facility];  Service: Urology;  Laterality: N/A;   Social History:  reports that he has never smoked. He has never used smokeless tobacco. He reports that he does not  drink alcohol or use illicit drugs.  Allergies  Allergen Reactions  . Thiazide-Type Diuretics     Patient was just told not to take, not sure if an actual allergy    Family History  Problem Relation Age of Onset  . Hypertension Mother   . Hypertension Brother   . Cerebral aneurysm Mother     Prior to Admission medications   Medication Sig Start Date End Date Taking? Authorizing Provider  ALPRAZolam Prudy Feeler) 1 MG tablet Take 1 mg by mouth at bedtime as needed for sleep.  10/02/11  Yes Christiane Ha, MD  clobetasol ointment (TEMOVATE) 0.05 % Apply 1 application topically daily as needed. For legs 02/09/14  Yes Historical Provider, MD  cyanocobalamin 2000 MCG tablet Take 2,000 mcg by mouth daily.   Yes Historical Provider, MD  diltiazem (CARDIZEM CD) 300 MG 24 hr capsule Take 300 mg by mouth daily.    Yes Historical Provider, MD  ferrous sulfate 325 (65 FE) MG tablet Take 325 mg by mouth daily.  10/17/11  Yes Vassie Loll, MD  fish oil-omega-3 fatty acids 1000 MG capsule Take 1 capsule (1 g total) by mouth 2 (two) times daily. 10/02/11  Yes Christiane Ha, MD  furosemide (LASIX) 40 MG tablet Take 40 mg by mouth 3 (three) times daily as needed.   Yes Historical Provider, MD  Insulin Detemir (LEVEMIR FLEXTOUCH) 100 UNIT/ML Pen Inject 70 Units into the skin at bedtime. 10/25/15  Yes Roma Kayser, MD  levothyroxine (SYNTHROID, LEVOTHROID) 137 MCG  tablet Take 137 mcg by mouth daily. 01/30/16  Yes Historical Provider, MD  magnesium citrate SOLN Take 296 mLs (1 Bottle total) by mouth once. 03/11/16  Yes Lavera Guise, MD  megestrol (MEGACE) 400 MG/10ML suspension Take 10 mLs (400 mg total) by mouth daily. 07/29/14  Yes Standley Brooking, MD  metFORMIN (GLUCOPHAGE) 500 MG tablet Take 500 mg by mouth 2 (two) times daily with a meal.   Yes Historical Provider, MD  NOVOLOG FLEXPEN 100 UNIT/ML FlexPen INJECT 10 UNITS SUBCUTANEOUSLY THREE TIMES DAILY 12/03/15  Yes Roma Kayser, MD    polyethylene glycol powder (GLYCOLAX/MIRALAX) powder Take 17 g by mouth 2 (two) times daily. Until daily soft stools  OTC 02/24/16  Yes Eber Hong, MD  Potassium Chloride ER 20 MEQ TBCR Take 1 tablet by mouth 2 (two) times daily. 12/26/15  Yes Historical Provider, MD  senna (SENOKOT) 8.6 MG TABS tablet Take 1 tablet (8.6 mg total) by mouth daily. 03/11/16  Yes Lavera Guise, MD  terazosin (HYTRIN) 10 MG capsule Take 10 mg by mouth daily. 01/29/14  Yes Historical Provider, MD  Travoprost, BAK Free, (TRAVATAN) 0.004 % SOLN ophthalmic solution Place 1 drop into both eyes at bedtime.   Yes Historical Provider, MD   Physical Exam: Filed Vitals:   03/16/16 1159 03/16/16 1406 03/16/16 1633 03/16/16 1800  BP: 145/87 148/84 152/84   Pulse: 87 60 84   Temp: 98.3 F (36.8 C)     TempSrc: Oral     Resp: Height:     (1.93 m)  Weight:    130 kg (286 lb 9.6 oz)  SpO2: 96% 96% 94%     Wt Readings from Last 3 Encounters:  03/16/16 130 kg (286 lb 9.6 oz)  03/11/16 131.543 kg (290 lb)  02/24/16 140.615 kg (310 lb)    General:  Appears calm and comfortable Eyes: PERRL, normal lids, irises & conjunctiva ENT: grossly normal hearing, lips & tongue Neck: no LAD, masses or thyromegaly Cardiovascular: RRR, no m/r/g. No LE edema. Telemetry: SR, no arrhythmias  Respiratory: CTA bilaterally, no w/r/r. Normal respiratory effort. Abdomen: soft, ntnd Skin: no rash or induration seen on limited exam Musculoskeletal: grossly normal tone BUE/BLE Psychiatric: flat affect, appears withdrawn Neurologic: grossly non-focal.          Labs on Admission:  Basic Metabolic Panel:  Recent Labs Lab 03/11/16 1854 03/16/16 1349  NA 151* 158*  K 3.4* 3.4*  CL 110 118*  CO2 28 27  GLUCOSE 145* 181*  BUN 40* 65*  CREATININE 1.97* 2.45*  CALCIUM 9.1 9.0   Liver Function Tests:  Recent Labs Lab 03/11/16 1854 03/16/16 1349  AST 37 44*  ALT 56 42  ALKPHOS 57 53  BILITOT 1.1 1.0  PROT 8.5*  8.4*  ALBUMIN 3.6 3.5    Recent Labs Lab 03/11/16 1854  LIPASE 28   No results for input(s): AMMONIA in the last 168 hours. CBC:  Recent Labs Lab 03/11/16 1854 03/16/16 1349  WBC 7.6 9.5  NEUTROABS 5.6 7.5  HGB 13.2 13.5  HCT 41.4 42.7  MCV 91.2 92.4  PLT 165 141*   Cardiac Enzymes: No results for input(s): CKTOTAL, CKMB, CKMBINDEX, TROPONINI in the last 168 hours.  BNP (last 3 results) No results for input(s): BNP in the last 8760 hours.  ProBNP (last 3 results) No results for input(s): PROBNP in the last 8760 hours.  CBG: No results for input(s): GLUCAP in the last  168 hours.  Radiological Exams on Admission: Dg Abd Acute W/chest  03/16/2016  CLINICAL DATA:  Constipation for 3 weeks. EXAM: DG ABDOMEN ACUTE W/ 1V CHEST COMPARISON:  Chest radiographs 03/14/2015. CT abdomen and pelvis 03/11/2016 FINDINGS: The cardiomediastinal silhouette is within normal limits. Slight chronic elevation of the right hemidiaphragm is noted. The lungs are well inflated without evidence of airspace consolidation, edema, pleural effusion, or pneumothorax. No intraperitoneal free air is identified. Residual oral contrast material is present in the proximal colon. A small to at most moderate amount of stool is present in the ascending, transverse, and proximal descending colon. Gas is present in predominantly decompressed distal colon. No dilated loops of bowel suggestive of obstruction are identified. Lumbar spondylosis and renal calculi are noted as demonstrated on recent CT. IMPRESSION: 1. No evidence of acute cardiopulmonary process. 2. Nonobstructed bowel gas pattern. Residual contrast in the proximal colon. Electronically Signed   By: Sebastian AcheAllen  Grady M.D.   On: 03/16/2016 13:30    Assessment/Plan Active Problems:   Hypothyroidism   BPH (benign prostatic hyperplasia)   Hypertension   Hypokalemia   Failure to thrive in adult   Dehydration   Hypernatremia   Acute renal failure superimposed  on stage 3 chronic kidney disease (HCC)   Diabetes mellitus type 2 with complications (HCC)   Depression   Constipation   1. Acute renal failure superimposed on stage III chronic kidney disease. Likely related to dehydration and poor by mouth intake. Start the patient on IV fluids and recheck labs in a.m . 2. Bladder outlet obstruction due to BPH. Will place Foley catheter while in the hospital. Continue Terazosin. 3. Hypernatremia. Related to dehydration. Start the patient on hypotonic fluids. 4. Diabetes. Patient is normally on large dose of insulin but reports that he has not taken any insulin in the past several days. Would reduce his Levemir to 10 units until he is reliably taking by mouth. Continue on sliding scale insulin. Hold metformin. 5. Hypertension. Continue on diltiazem. 6. Providers in. Continue Synthroid and check TSH. 7. Constipation. Will give milk and molasses enema. Continue on MiraLAX. 8. Depression. Patient was evaluated by psychiatry and was not felt to be candidate for inpatient treatment. We'll start the patient on Celexa. Follow-up with psychiatry as an outpatient.    Code Status: full code DVT Prophylaxis: lovenox Family Communication: discussed with patient and son at the bedside Disposition Plan: discharge home once improved  Time spent: 60mins  [facility]uther[facility] HospitalMEMON,JEHANZEB [facility] Pager [phone]

## 2016-03-16 NOTE — BH Assessment (Addendum)
Writer consulted with the ER MD regarding the patient denying SI/HI/Psychosis.  Writer attempted to obtain collateral information from the patient's son.  Writer was only able to leave a voice mail message on the phone [phone]).

## 2016-03-16 NOTE — Progress Notes (Signed)
#  16 Foley catheter inserted as ordered.300 ml of dark-yellow urine obtained. Pt. Tolerated well.

## 2016-03-16 NOTE — ED Notes (Signed)
Pt reports he self caths himself at home. Spoke with Dr. Deretha EmoryZackowski who gave verbal to in and out cath pt.

## 2016-03-16 NOTE — Progress Notes (Signed)
Patient called for the bedpan. After having a small bowel movement, patient states that he doesn't feel well. Moments prior, patient was alert and oriented. Now patient is drowsy and slow to respond. Checked vital signs, BP was 75/51. A few minutes later, patient becoming more responsive. Says he feels a little better but still not as alert as he was before this incident. Patient is still oriented. Contacted midlevel. Order received for 500cc NS bolus. Will give and continue to monitor.

## 2016-03-16 NOTE — ED Notes (Signed)
Son voiced to Dr. Judd Lienelo that he felt his father had " given up will to live" son on phone at present moment with [facility].

## 2016-03-16 NOTE — Progress Notes (Signed)
At request of [facility], faxed outpatient mental health treatment provider list to [facility] to supply to pt upon d/c.   Ilean SkillMeghan Ellisa Devivo, MSW, LCSW Clinical Social Work, Disposition  03/16/2016 [phone]

## 2016-03-16 NOTE — Progress Notes (Signed)
Milk and Molasses enema given. Patient tolerated well.

## 2016-03-16 NOTE — ED Provider Notes (Signed)
CSN: [id number]     Arrival date & time 03/16/16  1156 History  By signing my name below, I, Emmanuella Mensah, attest that this documentation has been prepared under the direction and in the presence of Geoffery Lyonsouglas Geri Hepler, MD. Electronically Signed: Angelene GiovanniEmmanuella Mensah, ED Scribe. 03/16/2016. 12:58 PM.    Chief Complaint  Patient presents with  . Constipation   Patient is a 82 y.o. male presenting with constipation. The history is provided by the patient. No language interpreter was used.  Constipation Severity:  Moderate Time since last bowel movement:  5 days Timing:  Constant Progression:  Worsening Chronicity:  New Context: dietary changes   Stool description:  None produced Relieved by:  None tried Worsened by:  Nothing tried Ineffective treatments:  None tried Associated symptoms: abdominal pain   Associated symptoms: no diarrhea, no fever, no nausea and no vomiting    HPI Comments: Garrett Bradley is a 82 y.o. male with a hx of HTN, DM, and who presents to the Emergency Department complaining of gradually worsening intermittent episodes of constipation onset 3 weeks ago. He reports associated mild generalized abdominal pain and decreased appetite. He adds that his latest episode started 5 days ago. No alleviating factors noted. He denies that he has tried any medications PTA, stating that he was unable to fill the prescription (Senna and Magnesium citrate). No fever, chills, or n/v.   Past Medical History  Diagnosis Date  . Hypertension   . Diabetes mellitus   . Hypothyroidism   . Depression   . Urinary problem   . Hypokalemia   . Enlarged prostate    Past Surgical History  Procedure Laterality Date  . Hernia repair    . Transurethral resection of prostate    . Transurethral resection of prostate  09/22/2011    Procedure: TRANSURETHRAL RESECTION OF THE PROSTATE (TURP);  Surgeon: Ky BarbanMohammad I Javaid;  Location: [facility];  Service: Urology;  Laterality: N/A;   Family History   Problem Relation Age of Onset  . Hypertension Mother   . Hypertension Brother   . Cerebral aneurysm Mother    Social History  Substance Use Topics  . Smoking status: Never Smoker   . Smokeless tobacco: Never Used  . Alcohol Use: No    Review of Systems  Constitutional: Negative for fever and chills.  Gastrointestinal: Positive for abdominal pain and constipation. Negative for nausea, vomiting and diarrhea.  All other systems reviewed and are negative.     Allergies  Thiazide-type diuretics  Home Medications   Prior to Admission medications   Medication Sig Start Date End Date Taking? Authorizing Provider  ALPRAZolam Prudy Feeler(XANAX) 1 MG tablet Take 1 mg by mouth at bedtime as needed for sleep.  10/02/11  Yes Christiane Haorinna L Sullivan, MD  clobetasol ointment (TEMOVATE) 0.05 % Apply 1 application topically daily as needed. For legs 02/09/14  Yes Historical Provider, MD  cyanocobalamin 2000 MCG tablet Take 2,000 mcg by mouth daily.   Yes Historical Provider, MD  diltiazem (CARDIZEM CD) 300 MG 24 hr capsule Take 300 mg by mouth daily.    Yes Historical Provider, MD  ferrous sulfate 325 (65 FE) MG tablet Take 325 mg by mouth daily.  10/17/11  Yes Vassie Lollarlos Madera, MD  fish oil-omega-3 fatty acids 1000 MG capsule Take 1 capsule (1 g total) by mouth 2 (two) times daily. 10/02/11  Yes Christiane Haorinna L Sullivan, MD  furosemide (LASIX) 40 MG tablet Take 40 mg by mouth 3 (three) times daily as needed.  Yes Historical Provider, MD  Insulin Detemir (LEVEMIR FLEXTOUCH) 100 UNIT/ML Pen Inject 70 Units into the skin at bedtime. 10/25/15  Yes Roma Kayser, MD  levothyroxine (SYNTHROID, LEVOTHROID) 137 MCG tablet Take 137 mcg by mouth daily. 01/30/16  Yes Historical Provider, MD  magnesium citrate SOLN Take 296 mLs (1 Bottle total) by mouth once. 03/11/16  Yes Lavera Guise, MD  megestrol (MEGACE) 400 MG/10ML suspension Take 10 mLs (400 mg total) by mouth daily. 07/29/14  Yes Standley Brooking, MD  metFORMIN  (GLUCOPHAGE) 500 MG tablet Take 500 mg by mouth 2 (two) times daily with a meal.   Yes Historical Provider, MD  NOVOLOG FLEXPEN 100 UNIT/ML FlexPen INJECT 10 UNITS SUBCUTANEOUSLY THREE TIMES DAILY 12/03/15  Yes Roma Kayser, MD  polyethylene glycol powder (GLYCOLAX/MIRALAX) powder Take 17 g by mouth 2 (two) times daily. Until daily soft stools  OTC 02/24/16  Yes Eber Hong, MD  Potassium Chloride ER 20 MEQ TBCR Take 1 tablet by mouth 2 (two) times daily. 12/26/15  Yes Historical Provider, MD  senna (SENOKOT) 8.6 MG TABS tablet Take 1 tablet (8.6 mg total) by mouth daily. 03/11/16  Yes Lavera Guise, MD  terazosin (HYTRIN) 10 MG capsule Take 10 mg by mouth daily. 01/29/14  Yes Historical Provider, MD  Travoprost, BAK Free, (TRAVATAN) 0.004 % SOLN ophthalmic solution Place 1 drop into both eyes at bedtime.   Yes Historical Provider, MD  cephALEXin (KEFLEX) 500 MG capsule Take 1 capsule (500 mg total) by mouth 4 (four) times daily. Patient not taking: Reported on 03/11/2016 02/24/16   Eber Hong, MD   BP 145/87 mmHg  Pulse 87  Temp(Src) 98.3 F (36.8 C) (Oral)  Resp 16  SpO2 96% Physical Exam  Constitutional: He is oriented to person, place, and time. He appears well-developed and well-nourished.  HENT:  Head: Normocephalic and atraumatic.  Eyes: EOM are normal.  Neck: Normal range of motion.  Cardiovascular: Normal rate, regular rhythm, normal heart sounds and intact distal pulses.   Pulmonary/Chest: Effort normal and breath sounds normal. No respiratory distress.  Abdominal: Soft. He exhibits no distension. There is no tenderness.  Genitourinary: Rectum normal and prostate normal.  No stool palpable in the rectal vault.   Musculoskeletal: Normal range of motion.  Neurological: He is alert and oriented to person, place, and time.  Skin: Skin is warm and dry.  Psychiatric: He has a normal mood and affect. Judgment normal.  Nursing note and vitals reviewed.   ED Course  Procedures  (including critical care time) DIAGNOSTIC STUDIES: Oxygen Saturation is 96% on RA, normal by my interpretation.    COORDINATION OF CARE: 12:57 PM- Pt advised of plan for treatment and pt agrees. Pt will receive x-ray for further evaluation. He will also talk to [facility].    Labs Review Labs Reviewed - No data to display  Imaging Review No results found.   Geoffery Lyons, MD has personally reviewed and evaluated these images and lab results as part of his medical decision-making.    MDM   Final diagnoses:  None   Patient presents for evaluation of weakness, fatigue, depression, and constipation. The son voices concerns that his father is depressed and does not want to live. He was evaluated by TTS for this who feels as though the patient has not meeting inpatient criteria and that this can be dealt with as an outpatient. I am in agreement with this assessment.  However, the patient's laboratory studies returned with  significantly worsening renal function and increasing hypernatremia consistent with the patient's stated decreased by mouth intake and dehydration. I feel as though he will require admission for IV hydration and correction of his electrolytes and renal function. I've spoken with Dr. Kerry Hough who agrees to admit.  I personally performed the services described in this documentation, which was scribed in my presence. The recorded information has been reviewed and is accurate.        Geoffery Lyons, MD 03/16/16 [phone]

## 2016-03-16 NOTE — ED Notes (Signed)
Attempted IV x2 with no success. Will have another nurse attempt. Pt resting in bed with no distress. Son at bsd.

## 2016-03-16 NOTE — BH Assessment (Signed)
Per the patients son, his father is feeling weak and he is not eating.  His denies that his father has stated SI/HI/Psychosis. His father was widowed from his first wife and he is separated from his second wife.  During the assessment the patient reported that he was widowed. His son reports that his mother died and he has been married to his current wife for over 20 years but they are currently separated.  His son could not remember how long they have been separated.    His son reports concerns that his father has been to the ED on two separate occassions for not eating and having constipation.  His son feels as if there is something medically wrong with his father to feel this way.

## 2016-03-16 NOTE — ED Notes (Signed)
Pt was here last week for constipation. Pt was given enema and had little relief. Pt reports constipation is still present. Denies any nausea.

## 2016-03-16 NOTE — BH Assessment (Addendum)
Tele Assessment Note   Patient is a 82 year old African American male that denies SI/HI/Psychosis/Substance Abuse.   Patient reports that he came to the ED by ambulance due to his niece calling the ambulance.  Patient reports that he came to the ED due to feeling weak.  Patient reports constipation for the past two weeks.  Patient reports that he was in the ED last week due to the constipation.   Patient denies prior inpatient psychiatric hospitalization.  Patient reports prior outpatient counseling or medication management.   Patient denies physical, sexual or emotional abuse.    Diagnosis: Adjustment Disorder   Past Medical History:  Past Medical History  Diagnosis Date  . Hypertension   . Diabetes mellitus   . Hypothyroidism   . Depression   . Urinary problem   . Hypokalemia   . Enlarged prostate     Past Surgical History  Procedure Laterality Date  . Hernia repair    . Transurethral resection of prostate    . Transurethral resection of prostate  09/22/2011    Procedure: TRANSURETHRAL RESECTION OF THE PROSTATE (TURP);  Surgeon: Ky Barban;  Location: [facility];  Service: Urology;  Laterality: N/A;    Family History:  Family History  Problem Relation Age of Onset  . Hypertension Mother   . Hypertension Brother   . Cerebral aneurysm Mother     Social History:  reports that he has never smoked. He has never used smokeless tobacco. He reports that he does not drink alcohol or use illicit drugs.  Additional Social History:  Alcohol / Drug Use History of alcohol / drug use?: No history of alcohol / drug abuse  CIWA: CIWA-Ar BP: 145/87 mmHg Pulse Rate: 87 COWS:    PATIENT STRENGTHS: (choose at least two) Average or above average intelligence Capable of independent living Communication skills Financial means Supportive family/friends  Allergies:  Allergies  Allergen Reactions  . Thiazide-Type Diuretics     Patient was just told not to take, not sure if an  actual allergy    Home Medications:  (Not in a hospital admission)  OB/GYN Status:  No LMP for male patient.  General Assessment Data Location of Assessment: [facility] TTS Assessment: In system Is this a Tele or Face-to-Face Assessment?: Tele Assessment Is this an Initial Assessment or a Re-assessment for this encounter?: Initial Assessment Marital status: Widowed [place] name: NA Is patient pregnant?: No Pregnancy Status: No Living Arrangements: Alone Can pt return to current living arrangement?: Yes Admission Status: Voluntary Is patient capable of signing voluntary admission?: Yes Referral Source: Self/Family/Friend Insurance type: uhc     Crisis Care Plan Living Arrangements: Alone Legal Guardian:  (NA) Name of Psychiatrist: None Reported Name of Therapist: None Reported  Education Status Is patient currently in school?: No Current Grade: NA Highest grade of school patient has completed: NA Name of school: NA Contact person: NA  Risk to self with the past 6 months Suicidal Ideation: No Has patient been a risk to self within the past 6 months prior to admission? : No Suicidal Intent: No Has patient had any suicidal intent within the past 6 months prior to admission? : No Is patient at risk for suicide?: No Suicidal Plan?: No Has patient had any suicidal plan within the past 6 months prior to admission? : No Access to Means: No What has been your use of drugs/alcohol within the last 12 months?: None  Previous Attempts/Gestures: No How many times?: 0 Other Self Harm  Risks: None Triggers for Past Attempts: None known Intentional Self Injurious Behavior: None Family Suicide History: No Recent stressful life event(s): Other (Comment) (Lives alone) Persecutory voices/beliefs?: No Depression: Yes Depression Symptoms: Despondent, Feeling worthless/self pity Substance abuse history and/or treatment for substance abuse?: No Suicide prevention information given to  non-admitted patients: Not applicable  Risk to Others within the past 6 months Homicidal Ideation: No Does patient have any lifetime risk of violence toward others beyond the six months prior to admission? : No Thoughts of Harm to Others: No Current Homicidal Intent: No Current Homicidal Plan: No Access to Homicidal Means: No Identified Victim: None Reported History of harm to others?: No Assessment of Violence: None Noted Violent Behavior Description: None Reported Does patient have access to weapons?: No Criminal Charges Pending?: No Does patient have a court date: No Is patient on probation?: No  Psychosis Hallucinations: None noted Delusions: None noted  Mental Status Report Appearance/Hygiene: Disheveled Eye Contact: Good Motor Activity: Freedom of movement Speech: Logical/coherent Level of Consciousness: Alert Mood: Depressed Affect: Appropriate to circumstance Anxiety Level: None Thought Processes: Coherent, Relevant Judgement: Unimpaired Orientation: Person, Place, Time, Situation Obsessive Compulsive Thoughts/Behaviors: None  Cognitive Functioning Concentration: Decreased Memory: Recent Intact, Remote Intact IQ: Average Insight: Fair Impulse Control: Fair Appetite: Fair Weight Loss: 0 Weight Gain: 0 Sleep: No Change Total Hours of Sleep: 8 Vegetative Symptoms: Staying in bed  ADLScreening [facility]) Patient's cognitive ability adequate to safely complete daily activities?: Yes Patient able to express need for assistance with ADLs?: Yes Independently performs ADLs?: Yes (appropriate for developmental age) (Patient uses a cath at home)  Prior Inpatient Therapy Prior Inpatient Therapy: No Prior Therapy Dates: NA Prior Therapy Facilty/Provider(s): NA Reason for Treatment: NA  Prior Outpatient Therapy Prior Outpatient Therapy: No Prior Therapy Dates: NA Prior Therapy Facilty/Provider(s): NA Reason for Treatment: NA Does patient have  an ACCT team?: No Does patient have Intensive In-House Services?  : No Does patient have [facility] services? : No Does patient have P4CC services?: No  ADL Screening (condition at time of admission) Patient's cognitive ability adequate to safely complete daily activities?: Yes Is the patient deaf or have difficulty hearing?: No Does the patient have difficulty seeing, even when wearing glasses/contacts?: No Does the patient have difficulty concentrating, remembering, or making decisions?: No Patient able to express need for assistance with ADLs?: Yes Does the patient have difficulty dressing or bathing?: No Independently performs ADLs?: Yes (appropriate for developmental age) (Patient uses a cath at home) Does the patient have difficulty walking or climbing stairs?: No Weakness of Legs: None Weakness of Arms/Hands: None  Home Assistive Devices/Equipment Home Assistive Devices/Equipment: None    Abuse/Neglect Assessment (Assessment to be complete while patient is alone) Physical Abuse: Denies Verbal Abuse: Denies Sexual Abuse: Denies Exploitation of patient/patient's resources: Denies Self-Neglect: Denies Values / Beliefs Cultural Requests During Hospitalization: None Spiritual Requests During Hospitalization: None Consults Spiritual Care Consult Needed: No Social Work Consult Needed: No Merchant navy officerAdvance Directives (For Healthcare) Does patient have an advance directive?: No Would patient like information on creating an advanced directive?: No - patient declined information    Additional Information 1:1 In Past 12 Months?: No CIRT Risk: No Elopement Risk: No Does patient have medical clearance?: Yes     Disposition: Per Vernona RiegerLaura, NP - patient meets criteria for inpatient hospitalization.  [facility]   Disposition Initial Assessment Completed for this Encounter: Yes  Phillip HealStevenson, Sundra Haddix LaVerne 03/16/2016 1:39 PM

## 2016-03-17 DIAGNOSIS — E43 Unspecified severe protein-calorie malnutrition: Secondary | ICD-10-CM | POA: Insufficient documentation

## 2016-03-17 DIAGNOSIS — E038 Other specified hypothyroidism: Secondary | ICD-10-CM

## 2016-03-17 LAB — BASIC METABOLIC PANEL
Anion gap: 12 (ref 5–15)
BUN: 62 mg/dL — AB (ref 6–20)
CO2: 27 mmol/L (ref 22–32)
CREATININE: 2.42 mg/dL — AB (ref 0.61–1.24)
Calcium: 8.8 mg/dL — ABNORMAL LOW (ref 8.9–10.3)
Chloride: 122 mmol/L — ABNORMAL HIGH (ref 101–111)
GFR calc Af Amer: 27 mL/min — ABNORMAL LOW (ref >60)
GFR, EST NON AFRICAN AMERICAN: 24 mL/min — AB (ref >60)
Glucose, Bld: 226 mg/dL — ABNORMAL HIGH (ref 65–99)
Potassium: 3.6 mmol/L (ref 3.5–5.1)
SODIUM: 161 mmol/L — AB (ref 135–145)

## 2016-03-17 LAB — GLUCOSE, CAPILLARY
GLUCOSE-CAPILLARY: 184 mg/dL — AB (ref 65–99)
Glucose-Capillary: 226 mg/dL — ABNORMAL HIGH (ref 65–99)
Glucose-Capillary: 210 mg/dL — ABNORMAL HIGH (ref 65–99)
Glucose-Capillary: 296 mg/dL — ABNORMAL HIGH (ref 65–99)

## 2016-03-17 LAB — URINE MICROSCOPIC-ADD ON: Squamous Epithelial / LPF: NONE SEEN

## 2016-03-17 LAB — URINALYSIS, ROUTINE W REFLEX MICROSCOPIC
GLUCOSE, UA: NEGATIVE mg/dL
Nitrite: NEGATIVE
PH: 7.5 (ref 5.0–8.0)
Protein, ur: 100 mg/dL — AB
Specific Gravity, Urine: 1.01 (ref 1.005–1.030)

## 2016-03-17 MED ORDER — GLUCERNA SHAKE PO LIQD
237.0000 mL | Freq: Two times a day (BID) | ORAL | Status: DC
  Administered 2016-03-17 – 2016-03-22 (×8): 237 mL via ORAL

## 2016-03-17 MED ORDER — DEXTROSE 5 % IV SOLN
INTRAVENOUS | Status: DC
  Administered 2016-03-17 (×3): via INTRAVENOUS

## 2016-03-17 MED ORDER — DEXTROSE 5 % IV SOLN: INTRAVENOUS | Status: DC

## 2016-03-17 NOTE — Progress Notes (Signed)
[facility] PROGRESS NOTE  Garrett PanderJames C Bradley [id number] DOB: 07/01/1934 DOA: 03/16/2016 PCP: Colette RibasGOLDING, Garrett [name], MD  Summary  [age] yom with complaints of constipation and generalized weakness, found to be likely due to dehydration and poor oral intake. Patient also noted to have acute renal failure superimposed on stage III CKD as well as significant hypernaremia. Started on IVF and foley placed due to chronic bladder outlet obstruction. His poor oral intake is likely being driven by underlying depression. He still needs continued IV hydration. Anticipate discharge in 1-2 days.     Assessment/Plan: 1. Acute renal failure superimposed on stage III chronic kidney disease. Likely related to dehydration and poor by mouth intake. Continue IVF and monitor urine output. 2. Bladder outlet obstruction due to BPH. Foley catheter placed. Continue Terazosin. 3. Hypernatremia. Related to dehydration. Sodium has trended up. Continue hypotonic fluids and recheck in AM 4. Diabetes. Patient is normally on large dose of insulin but reports that he has not taken any insulin in the past several days. Reduced his Levemir to 10 units until he is reliably taking by mouth. Continue on sliding scale insulin. Hold metformin. 5. Hypertension. Continue on diltiazem. 6. Hypothyroidism. Continue Synthroid and TSH wnl. 7. Constipation, improving. Given milk and molasses enema with bowel movement. Continue on MiraLAX. 8. Depression. Patient was evaluated by psychiatry and was not felt to be candidate for inpatient treatment. Patient started on Celexa. Follow-up with psychiatry as an outpatient.  Code Status: Full DVT prophylaxis:Lovenox Family Communication: no family present Disposition Plan: discharge home once improved   Consultants:  None  Procedures:  None  Antibiotics:  None  HPI/Subjective: No shortness of breath, no chest pain. Still feels weak  Objective: Filed Vitals:   03/16/16 2331 03/17/16  0440  BP: 75/51 116/62  Pulse: 83 73  Temp:  97.8 F (36.6 C)  Resp:  20    Intake/Output Summary (Last 24 hours) at 03/17/16 0729 Last data filed at 03/17/16 0440  Gross per 24 hour  Intake      0 ml  Output    950 ml  Net   -950 ml   Filed Weights   03/16/16 1800  Weight: 130 kg (286 lb 9.6 oz)    Exam: General: NAD, looks comfortable Cardiovascular: RRR, S1, S2  Respiratory: clear bilaterally, No wheezing, rales or rhonchi Abdomen: soft, non tender, no distention , bowel sounds normal Musculoskeletal: No edema b/l  Data Reviewed: Basic Metabolic Panel:  Recent Labs Lab 03/11/16 1854 03/16/16 1349 03/16/16 1943 03/17/16 0423  NA 151* 158*  --  161*  K 3.4* 3.4*  --  3.6  CL 110 118*  --  122*  CO2 28 27  --  27  GLUCOSE 145* 181*  --  226*  BUN 40* 65*  --  62*  CREATININE 1.97* 2.45* 2.33* 2.42*  CALCIUM 9.1 9.0  --  8.8*   Liver Function Tests:  Recent Labs Lab 03/11/16 1854 03/16/16 1349  AST 37 44*  ALT 56 42  ALKPHOS 57 53  BILITOT 1.1 1.0  PROT 8.5* 8.4*  ALBUMIN 3.6 3.5    Recent Labs Lab 03/11/16 1854  LIPASE 28   No results for input(s): AMMONIA in the last 168 hours. CBC:  Recent Labs Lab 03/11/16 1854 03/16/16 1349 03/16/16 1943  WBC 7.6 9.5 9.1  NEUTROABS 5.6 7.5  --   HGB 13.2 13.5 14.0  HCT 41.4 42.7 44.2  MCV 91.2 92.4 91.5  PLT 165 141* 124*  Cardiac Enzymes: No results for input(s): CKTOTAL, CKMB, CKMBINDEX, TROPONINI in the last 168 hours. BNP (last 3 results) No results for input(s): BNP in the last 8760 hours.  ProBNP (last 3 results) No results for input(s): PROBNP in the last 8760 hours.  CBG:  Recent Labs Lab 03/16/16 2149 03/16/16 2331  GLUCAP 164* 170*    No results found for this or any previous visit (from the past 240 hour(s)).   Studies: Dg Abd Acute W/chest  03/16/2016  CLINICAL DATA:  Constipation for 3 weeks. EXAM: DG ABDOMEN ACUTE W/ 1V CHEST COMPARISON:  Chest radiographs  03/14/2015. CT abdomen and pelvis 03/11/2016 FINDINGS: The cardiomediastinal silhouette is within normal limits. Slight chronic elevation of the right hemidiaphragm is noted. The lungs are well inflated without evidence of airspace consolidation, edema, pleural effusion, or pneumothorax. No intraperitoneal free air is identified. Residual oral contrast material is present in the proximal colon. A small to at most moderate amount of stool is present in the ascending, transverse, and proximal descending colon. Gas is present in predominantly decompressed distal colon. No dilated loops of bowel suggestive of obstruction are identified. Lumbar spondylosis and renal calculi are noted as demonstrated on recent CT. IMPRESSION: 1. No evidence of acute cardiopulmonary process. 2. Nonobstructed bowel gas pattern. Residual contrast in the proximal colon. Electronically Signed   By: Sebastian Ache M.D.   On: 03/16/2016 13:30    Scheduled Meds: . citalopram  20 mg Oral Daily  . diltiazem  300 mg Oral Daily  . enoxaparin (LOVENOX) injection  40 mg Subcutaneous Q24H  . ferrous sulfate  325 mg Oral Daily  . insulin aspart  0-5 Units Subcutaneous QHS  . insulin aspart  0-9 Units Subcutaneous TID WC  . insulin detemir  10 Units Subcutaneous QHS  . latanoprost  1 drop Both Eyes QHS  . levothyroxine  137 mcg Oral QAC breakfast  . megestrol  400 mg Oral Daily  . polyethylene glycol  17 g Oral BID  . potassium chloride SA  20 mEq Oral BID  . senna  1 tablet Oral Daily  . terazosin  10 mg Oral Daily  . cyanocobalamin  2,000 mcg Oral Daily   Continuous Infusions: . dextrose 5 % with KCl 20 mEq / L 20 mEq (03/16/16 2036)    Active Problems:   Hypothyroidism   BPH (benign prostatic hyperplasia)   Hypertension   Hypokalemia   Failure to thrive in adult   Dehydration   Hypernatremia   Acute renal failure superimposed on stage 3 chronic kidney disease (HCC)   Diabetes mellitus type 2 with complications (HCC)    Depression   Constipation    Time spent:   Erick Blinks, MD   [facility] Pager [phone]. If 7PM-7AM, please contact night-coverage at [email or web address], password [facility] 03/17/2016, 7:29 AM  LOS: 1 day

## 2016-03-17 NOTE — Consult Note (Signed)
   [facility] Consult   03/17/2016  Garrett Bradley 03/26/1934 [id number]  Spoke with patient at bedside regarding [name] services. Patient does not wish to participate with [name] at this time. [facility] given [name] broch[facility] and contact information to review, he voices appreciation of information. Inpatient case manager made aware that patient has not [name] services at this time, but they Were offered today. Of note, [name] services would not replace or interfere with any services that are arranged by inpatient case management or social work. For additional questions or referrals please contact:  Alben SpittleMary E. Albertha GheeNiemczura, RN, BSN, [facility]  [facility] Hospital Liaison [phone]

## 2016-03-17 NOTE — Progress Notes (Signed)
Initial Nutrition Assessment  DOCUMENTATION CODES:  Obesity unspecified, Severe malnutrition in context of chronic illness   Pt meets criteria for SEVERE MALNUTRITION in the context of CHRONIC ILLNESS as evidenced by Loss of >5% bw in 1 month and an estimated oral intake that met < or equal to 75% of needs for > or equal to 1 month.  INTERVENTION:  Glucerna Shake po BID, each supplement provides 220 kcal and 10 grams of protein  NUTRITION DIAGNOSIS:  Inadequate oral intake related to Early satiety as evidenced by Per pt report of only being able to eat 4-6 bites at each meal.  GOAL:  Patient will meet greater than or equal to 90% of their needs  MONITOR:  PO intake, Supplement acceptance, Labs  REASON FOR ASSESSMENT:  Malnutrition Screening Tool    ASSESSMENT:  82 y/o male PMHx HTN, DM, Depression who represents for constipation and weakess after being seen in ER for same reason earlier in the week. Pt reports continued constipation and decreased PO intake(family reports x 3 weeks). Pt Reports early satiety. Has been more depressed since losing wife in 9/16  Pt was only fair historian. His largest PO inhibitor is early satiety. He becomes satiated after only taking his medications. He states that recently he has only been eating "4-6 bites" of food at each one of his 3 meals. RD asked when last time he consumed an entire meal was and he reports that he "has not eaten a full meal in a very long time". He did not take any vitamins or mineral supplements nor any oral nutritional supplements such as glucerna. He states he followed a DM diet. He also admits to drinking minimal fluids. RD went over the importance of hydration and the best beverages for him to consume.   Another large issue patient has is his severe constipation, likely secondary to poor hydration. He says he has not had a BM in 4-5 days. Per documentation, it looks like he had small BM this morning.   Though patient has not  had Gllucerna, he was agreeable to trying. Explained these type of beverages are ideal for individuals who are only able to consume small amounts.   He states he checked his BG at home. His cbgs were usually 120-125. Noted hyperglycemia today though was receiving Dextrose infusion on arrival.  His weight measurements have been erratic and he did not know what his normal weight was. He said he weighed 290 a few days ago, but that is all the weight hx he could provide. Per EMR documentation, he weighed 310 in ED <1 month ago. He exhibits wt loss of >5% bw x 1 month  NFPE: WDL  Labs reviewed: profound hypernatremia/chloremia  Recent Labs Lab 03/11/16 1854 03/16/16 1349 03/16/16 1943 03/17/16 0423  NA 151* 158*  --  161*  K 3.4* 3.4*  --  3.6  CL 110 118*  --  122*  CO2 28 27  --  27  BUN 40* 65*  --  62*  CREATININE 1.97* 2.45* 2.33* 2.42*  CALCIUM 9.1 9.0  --  8.8*  GLUCOSE 145* 181*  --  226*   Diet Order:  Diet regular Room service appropriate?: Yes; Fluid consistency:: Thin  Skin:  Reviewed, no issues  Last BM:  4/11  Height:  Ht Readings from Last 1 Encounters:  03/16/16 6' 4"  (1.93 m)   Weight:  Wt Readings from Last 1 Encounters:  03/16/16 286 lb 9.6 oz (130 kg)   Wt  Readings from Last 10 Encounters:  03/16/16 286 lb 9.6 oz (130 kg)  03/11/16 290 lb (131.543 kg)  02/24/16 310 lb (140.615 kg)  10/25/15 336 lb (152.409 kg)  07/28/14 275 lb 8 oz (124.966 kg)  07/09/14 294 lb (133.358 kg)  07/04/14 294 lb (133.358 kg)  06/25/14 289 lb 14.4 oz (131.498 kg)  04/04/14 345 lb (156.491 kg)  03/06/14 333 lb (151.048 kg)   Ideal Body Weight:  91.82 kg  BMI:  Body mass index is 34.9 kg/(m^2).  Estimated Nutritional Needs:  Kcal:  1950-2200 (15-17 cal/kg bw) Protein:  92-110 kcals (1-1.2 g/kg bw) Fluid:  > 2 liters  EDUCATION NEEDS:  No education needs identified at this time  Burtis Junes RD, LDN Clinical Nutrition Pager: [phone] 03/17/2016 3:10 PM

## 2016-03-18 ENCOUNTER — Inpatient Hospital Stay (HOSPITAL_COMMUNITY): Payer: Medicare Other

## 2016-03-18 DIAGNOSIS — E870 Hyperosmolality and hypernatremia: Secondary | ICD-10-CM

## 2016-03-18 DIAGNOSIS — Z794 Long term (current) use of insulin: Secondary | ICD-10-CM

## 2016-03-18 DIAGNOSIS — N179 Acute kidney failure, unspecified: Principal | ICD-10-CM

## 2016-03-18 DIAGNOSIS — N183 Chronic kidney disease, stage 3 (moderate): Secondary | ICD-10-CM

## 2016-03-18 DIAGNOSIS — R627 Adult failure to thrive: Secondary | ICD-10-CM

## 2016-03-18 DIAGNOSIS — E118 Type 2 diabetes mellitus with unspecified complications: Secondary | ICD-10-CM

## 2016-03-18 DIAGNOSIS — D696 Thrombocytopenia, unspecified: Secondary | ICD-10-CM

## 2016-03-18 DIAGNOSIS — K5900 Constipation, unspecified: Secondary | ICD-10-CM

## 2016-03-18 LAB — GLUCOSE, CAPILLARY
GLUCOSE-CAPILLARY: 228 mg/dL — AB (ref 65–99)
GLUCOSE-CAPILLARY: 191 mg/dL — AB (ref 65–99)
GLUCOSE-CAPILLARY: 246 mg/dL — AB (ref 65–99)
Glucose-Capillary: 220 mg/dL — ABNORMAL HIGH (ref 65–99)

## 2016-03-18 LAB — BASIC METABOLIC PANEL
ANION GAP: 7 (ref 5–15)
BUN: 65 mg/dL — ABNORMAL HIGH (ref 6–20)
CALCIUM: 8.7 mg/dL — AB (ref 8.9–10.3)
CO2: 29 mmol/L (ref 22–32)
CREATININE: 2.88 mg/dL — AB (ref 0.61–1.24)
Chloride: 118 mmol/L — ABNORMAL HIGH (ref 101–111)
GFR, EST AFRICAN AMERICAN: 22 mL/min — AB (ref >60)
GFR, EST NON AFRICAN AMERICAN: 19 mL/min — AB (ref >60)
Glucose, Bld: 225 mg/dL — ABNORMAL HIGH (ref 65–99)
Potassium: 3.2 mmol/L — ABNORMAL LOW (ref 3.5–5.1)
Sodium: 154 mmol/L — ABNORMAL HIGH (ref 135–145)

## 2016-03-18 LAB — CBC
HCT: 35.8 % — ABNORMAL LOW (ref 39.0–52.0)
Hemoglobin: 11.5 g/dL — ABNORMAL LOW (ref 13.0–17.0)
MCH: 29.2 pg (ref 26.0–34.0)
MCHC: 32.1 g/dL (ref 30.0–36.0)
MCV: 90.9 fL (ref 78.0–100.0)
PLATELETS: 86 10*3/uL — AB (ref 150–400)
RBC: 3.94 MIL/uL — AB (ref 4.22–5.81)
RDW: 15.8 % — ABNORMAL HIGH (ref 11.5–15.5)
WBC: 6.7 10*3/uL (ref 4.0–10.5)

## 2016-03-18 MED ORDER — SODIUM CHLORIDE 0.9 % IV SOLN
INTRAVENOUS | Status: DC
  Administered 2016-03-18: 11:00:00 via INTRAVENOUS

## 2016-03-18 MED ORDER — SODIUM CHLORIDE 0.9 % IV BOLUS (SEPSIS)
500.0000 mL | Freq: Once | INTRAVENOUS | Status: AC
  Administered 2016-03-18: 500 mL via INTRAVENOUS

## 2016-03-18 MED ORDER — DEXTROSE 5 % IV SOLN
INTRAVENOUS | Status: DC
  Administered 2016-03-18 – 2016-03-19 (×4): via INTRAVENOUS

## 2016-03-18 NOTE — Progress Notes (Signed)
Report received. Assume care of pt. Garrett Bradley, SN  

## 2016-03-18 NOTE — Progress Notes (Signed)
Garrett Bradley  PROGRESS NOTE  JAMAURY GUMZ ZOX:[id number] DOB: September 13, 1934 DOA: 03/16/2016 PCP: Colette Ribas, MD  Summary: 82yom presented to the emergency department with history of constipation and weakness, poor oral intake and early satiety. Poor eating for 3 weeks, family concerned about depression. Admitted for acute kidney injury, hypernatremia, failure to thrive, constipation, depression. He was evaluated by psychiatry in felt to be candidate for outpatient treatment and started on Celexa.  Assessment/Plan: 1. Acute kidney injury superimposed on stage III chronic kidney disease. Likely related to dehydration and poor oral intake. Foley catheter placement which has decompressed bladder. Continue IVF and monitor urine output. 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 Medical renal disease is suggested based on appearance. 2. Bladder outlet obstruction due to BPH, chronic issue, patient self catheterizes at home. Foley catheter placed in the emergency department. Continue Terazosin. 3. Hypernatremia. Slightly improved. History suggests secondary to poor oral intake. Elevated chloride likely related to fluids.  4. Thrombocytopenia, etiology and significance unclear. Continues to trend down.  5. Diabetes normally on insulin. Blood sugars stable. Patient is normally on large dose of insulin. Levemir reduced to 10 units until he is reliably taking by mouth. Continue on sliding scale insulin. Hold metformin. 6. Transient hypotension with defecation 4/10.Transient hypotension again 4/12 when drinking soda. Etiology and significance unclear. No neurologic deficits. Follow clinically. 7. Hypertension. Continue on diltiazem. 8. Hypothyroidism. Continue Synthroid and TSH wnl. 9. Failure to thrive. 10. Constipation. Stable. Given milk and molasses enema with bowel movement. Continue on MiraLAX. 11. Depression. Patient was evaluated by psychiatry and was not felt to be candidate for inpatient  treatment. Patient started on Celexa. Follow-up with psychiatry as an outpatient. 12. Obesity unspecified 13. Severe malnutrition   Overall appears stable  Discontinue Lovenox. Check HIT panel   Follow up nephrology recommendations.   Check CBC and BMP in the morning  Replace potassium  Continue aggressive bowel regimen  Outpatient follow-up with psychiatry  Code Status: Full DVT prophylaxis: Lovenox Family Communication: No family present Disposition Plan: Anticipate discharge   Brendia Sacks, MD  [facility] Direct contact:  --Via amion app OR  --[email or web address]; password [email or web address] and click  7PM-7AM contact night coverage as above 03/18/2016, 6:58 AM  LOS: 2 days   Consultants:  None  Procedures:  None  Antibiotics:  None  HPI/Subjective: Nursing reports the patient had an episode where he "staring off" after returning from ultrasound this morning. SBP 80s. Patient asymptomatic at this point, nurse reports normal neurologic examination. She reports nephrology recommended bolus of IV fluids.  Feels generally weak. Denies any pain, nausea, vomiting, difficulty breathing, numbness or tingling. Felt strange when his blood pressure dropped. He recalls drinking a soda when the episode occurred. Denied chest pain or trouble breathing. Mild abdominal pain.   Objective: Filed Vitals:   03/17/16 0440 03/17/16 1301 03/17/16 2017 03/18/16 0533  BP: 116/62 111/50 107/53 120/59  Pulse: 73 65 56 61  Temp: 97.8 F (36.6 C) 97.6 F (36.4 C) 97.9 F (36.6 C) 97.8 F (36.6 C)  TempSrc: Oral Oral Oral Oral  Resp: Height:      Weight:      SpO2: 97% 93% 99% 98%    Intake/Output Summary (Last 24 hours) at 03/18/16 0658 Last data filed at 03/18/16 0600  Gross per 24 hour  Intake   2415 ml  Output    300 ml  Net   2115 ml     [employer]  03/16/16 1800  Weight: 130 kg (286 lb 9.6 oz)    Exam:    Constitutional:  Appears calm and  comfortable Eyes:  PERRL and irises appear normal Normal conjunctivae and lids ENMT:  grossly normal hearing Respiratory:  CTA bilaterally, no w/r/r.  Respiratory effort normal. No retractions or accessory muscle use Cardiovascular:  RRR, no m/r/g No LE extremity edema   Abdomen:  Abdomen appears normal; no tenderness or masses No hernias Well healed supraumbilical scar  Musculoskeletal:  RUE, LUE, RLE, LLE   strength and tone normal, no atrophy, no abnormal movements No tenderness, masses Normal ROM, no contractures  Neurologic:  CN 2-12 intact Psychiatric:  Mental status Mood, affect appropriate  New data reviewed:  Sodium, improved 154  Potassium 3.2  BUN and Creatinine elevated, 65/2.88  UA noted, WBC TNTC   Platelet down to 86  Renal US noted   Scheduled Meds: . citalopram  20 mg Oral Daily  . diltiazem  300 mg Oral Daily  . enoxaparin (LOVENOX) injection  40 mg Subcutaneous Q24H  . feeding supplement (GLUCERNA SHAKE)  237 mL Oral BID BM  . ferrous sulfate  325 mg Oral Daily  . insulin aspart  0-5 Units Subcutaneous QHS  . insulin aspart  0-9 Units Subcutaneous TID WC  . insulin detemir  10 Units Subcutaneous QHS  . latanoprost  1 drop Both Eyes QHS  . levothyroxine  137 mcg Oral QAC breakfast  . megestrol  400 mg Oral Daily  . polyethylene glycol  17 g Oral BID  . potassium chloride SA  20 mEq Oral BID  . senna  1 tablet Oral Daily  . terazosin  10 mg Oral Daily  . cyanocobalamin  2,000 mcg Oral Daily   Continuous Infusions: . dextrose 100 mL/hr at 03/17/16 2117    Active Problems:   Hypothyroidism   BPH (benign prostatic hyperplasia)   Hypertension   Hypokalemia   Failure to thrive in adult   Dehydration   Hypernatremia   Acute renal failure superimposed on stage 3 chronic kidney disease (HCC)   Diabetes mellitus type 2 with complications (HCC)   Depression   Constipation   Protein-calorie malnutrition, severe   Time spent 25  minutes   By signing my name below, I, Zadie CleverlyJessica Augustus attest that this documentation has been prepared under the direction and in the presence of Brendia Sacksaniel Allora Bains, MD Electronically signed: Zadie CleverlyJessica Augustus 03/18/2016 12:01pm   I personally performed the services described in this documentation. All medical record entries made by the scribe were at my direction. I have reviewed the chart and agree that the record reflects my personal performance and is accurate and complete. Brendia Sacksaniel Belita Warsame, MD

## 2016-03-18 NOTE — Progress Notes (Signed)
Report given to RN. Yuleni Burich, SN  

## 2016-03-18 NOTE — Progress Notes (Addendum)
Inpatient Diabetes Program Recommendations  AACE/ADA: New Consensus Statement on Inpatient Glycemic Control (2015)  Target Ranges:  Prepandial:   less than 140 mg/dL      Peak postprandial:   less than 180 mg/dL (1-2 hours)      Critically ill patients:  140 - 180 mg/dL   Review of Glycemic Control Results for Garrett Bradley, Garrett Bradley (MRN [id number]) as of 03/18/2016 09:40  Ref. Range 03/17/2016 07:36 03/17/2016 11:33 03/17/2016 16:34 03/17/2016 21:07 03/18/2016 08:37  Glucose-Capillary Latest Ref Range: 65-99 mg/dL 409184 (H) 811226 (H) 914210 (H) 296 (H) 228 (H)   Diabetes history: DM Type 2 Outpatient Diabetes medications: Levemir 70 units daily + Novolog 10 units tid with meals + Metformin 500 mg bid Current orders for Inpatient glycemic control: Levemir 10 units q hs + Novolog correction scale tid with meals  Inpatient Diabetes Program Recommendations:  Please consider increase in Levemir to 35 units (1/2 home dose).  Thank you, Billy FischerJudy E. Lukah Goswami, RN, MSN, CDE Inpatient Glycemic Control Team Team Pager [phone] (8am-5pm) 03/18/2016 9:44 AM

## 2016-03-18 NOTE — Progress Notes (Signed)
Patient back from radiology when moved from stretcher to chair vomited and had an episode of staring off into space and not responding to verbal stimuli for about a minute. Patient's neuro checks wnl, BP 85/48. MD aware and orders given. Patient without complaint at this time.

## 2016-03-18 NOTE — Consult Note (Signed)
Garrett Bradley MRN: [id number] DOB/AGE: 80/02/1934 80 y.o. Primary Care Physician:[name], Chancy HurterJOHN [name], MD Admit date: 03/16/2016 Chief Complaint:  Chief Complaint  Patient presents with  . Constipation   HPI: Pt is 82 year old male with past medical hxof HTN who came to ER with c/o constipation.    HPI dates back to one week ago when pt presented to ER for constipation. Pt was treated with an enema . Pt was later d/ced home. Pt says he continued constipation. Pt also c/o decreased by mouth intake. Marland Kitchen. Pt offers  No c/o fever/ cough or shortness of breath. Pt says everything is stopped up-" My stool and my bladder."  Pt ha chronic bladder outlet obstruction and self catheterizes at home.  As per data review Family reported that he has not been eating well for over 3 weeks now. At the time of H&P pt had an episode of emesis and was hypotensive.   Past Medical History  Diagnosis Date  . Hypertension   . Diabetes mellitus   . Hypothyroidism   . Depression   . Urinary problem   . Hypokalemia   . Enlarged prostate         Family History  Problem Relation Age of Onset  . Hypertension Mother   . Hypertension Brother   . Cerebral aneurysm Mother     Social History:  reports that he has never smoked. He has never used smokeless tobacco. He reports that he does not drink alcohol or use illicit drugs.   Allergies:  Allergies  Allergen Reactions  . Thiazide-Type Diuretics     Patient was just told not to take, not sure if an actual allergy    Medications Prior to Admission  Medication Sig Dispense Refill  . ALPRAZolam (XANAX) 1 MG tablet Take 1 mg by mouth at bedtime as needed for sleep.     . clobetasol ointment (TEMOVATE) 0.05 % Apply 1 application topically daily as needed. For legs    . cyanocobalamin 2000 MCG tablet Take 2,000 mcg by mouth daily.    Marland Kitchen. diltiazem (CARDIZEM CD) 300 MG 24 hr capsule Take 300 mg by mouth daily.     . ferrous sulfate 325 (65 FE) MG tablet  Take 325 mg by mouth daily.     . fish oil-omega-3 fatty acids 1000 MG capsule Take 1 capsule (1 g total) by mouth 2 (two) times daily.    . furosemide (LASIX) 40 MG tablet Take 40 mg by mouth 3 (three) times daily as needed.    . Insulin Detemir (LEVEMIR FLEXTOUCH) 100 UNIT/ML Pen Inject 70 Units into the skin at bedtime. 15 mL 3  . levothyroxine (SYNTHROID, LEVOTHROID) 137 MCG tablet Take 137 mcg by mouth daily.    . magnesium citrate SOLN Take 296 mLs (1 Bottle total) by mouth once. 195 mL 0  . megestrol (MEGACE) 400 MG/10ML suspension Take 10 mLs (400 mg total) by mouth daily. 240 mL 0  . metFORMIN (GLUCOPHAGE) 500 MG tablet Take 500 mg by mouth 2 (two) times daily with a meal.    . NOVOLOG FLEXPEN 100 UNIT/ML FlexPen INJECT 10 UNITS SUBCUTANEOUSLY THREE TIMES DAILY 15 mL 2  . polyethylene glycol powder (GLYCOLAX/MIRALAX) powder Take 17 g by mouth 2 (two) times daily. Until daily soft stools  OTC 225 g 0  . Potassium Chloride ER 20 MEQ TBCR Take 1 tablet by mouth 2 (two) times daily.    Marland Kitchen. senna (SENOKOT) 8.6 MG TABS tablet Take 1 tablet (  8.6 mg total) by mouth daily. 120 each 0  . terazosin (HYTRIN) 10 MG capsule Take 10 mg by mouth daily.    . Travoprost, BAK Free, (TRAVATAN) 0.004 % SOLN ophthalmic solution Place 1 drop into both eyes at bedtime.    . [DISCONTINUED] cephALEXin (KEFLEX) 500 MG capsule Take 1 capsule (500 mg total) by mouth 4 (four) times daily. (Patient not taking: Reported on 03/11/2016) 28 capsule 0       WUJ:WJXBJ from the symptoms mentioned above,there are no other symptoms referable to all systems reviewed.  . citalopram  20 mg Oral Daily  . diltiazem  300 mg Oral Daily  . enoxaparin (LOVENOX) injection  40 mg Subcutaneous Q24H  . feeding supplement (GLUCERNA SHAKE)  237 mL Oral BID BM  . ferrous sulfate  325 mg Oral Daily  . insulin aspart  0-5 Units Subcutaneous QHS  . insulin aspart  0-9 Units Subcutaneous TID WC  . insulin detemir  10 Units Subcutaneous QHS   . latanoprost  1 drop Both Eyes QHS  . levothyroxine  137 mcg Oral QAC breakfast  . megestrol  400 mg Oral Daily  . polyethylene glycol  17 g Oral BID  . potassium chloride SA  20 mEq Oral BID  . senna  1 tablet Oral Daily  . terazosin  10 mg Oral Daily  . cyanocobalamin  2,000 mcg Oral Daily     Physical Exam: Vital signs in last 24 hours: Temp:  [97.6 F (36.4 C)-97.9 F (36.6 C)] 97.8 F (36.6 C) (04/12 0533) Pulse Rate:  [56-65] 61 (04/12 0533) Resp:  [20] 20 (04/12 0533) BP: (107-120)/(50-59) 120/59 mmHg (04/12 0533) SpO2:  [93 %-99 %] 98 % (04/12 0533) Weight change:  Last BM Date: 03/13/16 (per pt)  Intake/Output from previous day: 04/11 0701 - 04/12 0700 In: 2415 [P.O.:360; I.V.:2055] Out: 300 [Urine:300]     Physical Exam: General- pt is awake,alert, oriented to time place and person Resp- No acute REsp distress,Rhonchi + CVS- S1S2 regular in rate and rhythm GIT- BS+, soft, NT, ND EXT- NO LE Edema, Cyanosis CNS- CN 2-12 grossly intact. Moving all 4 extremities     Lab Results: CBC  Recent Labs  03/16/16 1349 03/16/16 1943  WBC 9.5 9.1  HGB 13.5 14.0  HCT 42.7 44.2  PLT 141* 124*    BMET  Recent Labs  03/16/16 1349 03/16/16 1943 03/17/16 0423  NA 158*  --  161*  K 3.4*  --  3.6  CL 118*  --  122*  CO2 27  --  27  GLUCOSE 181*  --  226*  BUN 65*  --  62*  CREATININE 2.45* 2.33* 2.42*  CALCIUM 9.0  --  8.8*    Creat 2017   2.3--2.4 2016   1.8 2015   1.3--2.4 2014    1.1--1.7 2012   1.3--3.2 ( AKI ) 2010   1.1--1.7  ( AKI )    Free water deficit 160-140 /140 times 0.6 times 130 kgs=11  liters   MICRO No results found for this or any previous visit (from the past 240 hour(s)).    Lab Results  Component Value Date   CALCIUM 8.8* 03/17/2016   CAION 1.17 10/19/2011   PHOS 3.0 10/19/2011      Impression: 1)Renal  AKI secondary to Prerena/ATN/Post renal               AKI sec to Hypovolemia  AKI sec to  post renal issuies               AKI on CKD               CKD stage 3.               CKD since 2010               CKD secondary to                    Low glomerular mass- horse shoe kidney                   Nephrolithiasis                   Age associated decline                     Progression of CKD marked with multiple AKI               2)HTN  Medication- On Calcium Channel Blockers On Alpha blockers   3)Anemia HGb at goal (9--11)   4)GI- admitted with constipation   5)Electrolytes  Normokalemic  Hypernatremic   Free water deficit of 11 liters   20 meq of Na needs to be corrected in 3 days   11000/72=139ml/hr or less  6)Acid base Co2 at goal     Plan:  Will ask for IVf bolues as pt hypotensive Will ask for fena Will increase the rate to  135 Will add  kcl in IVF Will folow BMet       Mildred Tuccillo S 03/18/2016, 9:50 AM

## 2016-03-19 DIAGNOSIS — E43 Unspecified severe protein-calorie malnutrition: Secondary | ICD-10-CM

## 2016-03-19 DIAGNOSIS — N400 Enlarged prostate without lower urinary tract symptoms: Secondary | ICD-10-CM

## 2016-03-19 LAB — BASIC METABOLIC PANEL
ANION GAP: 8 (ref 5–15)
BUN: 58 mg/dL — ABNORMAL HIGH (ref 6–20)
CO2: 27 mmol/L (ref 22–32)
Calcium: 8.5 mg/dL — ABNORMAL LOW (ref 8.9–10.3)
Chloride: 115 mmol/L — ABNORMAL HIGH (ref 101–111)
Creatinine, Ser: 2.47 mg/dL — ABNORMAL HIGH (ref 0.61–1.24)
GFR calc non Af Amer: 23 mL/min — ABNORMAL LOW (ref >60)
GFR, EST AFRICAN AMERICAN: 27 mL/min — AB (ref >60)
Glucose, Bld: 244 mg/dL — ABNORMAL HIGH (ref 65–99)
Potassium: 3.4 mmol/L — ABNORMAL LOW (ref 3.5–5.1)
Sodium: 150 mmol/L — ABNORMAL HIGH (ref 135–145)

## 2016-03-19 LAB — GLUCOSE, CAPILLARY
GLUCOSE-CAPILLARY: 203 mg/dL — AB (ref 65–99)
GLUCOSE-CAPILLARY: 221 mg/dL — AB (ref 65–99)
GLUCOSE-CAPILLARY: 247 mg/dL — AB (ref 65–99)
Glucose-Capillary: 250 mg/dL — ABNORMAL HIGH (ref 65–99)

## 2016-03-19 LAB — CBC
HEMATOCRIT: 34.3 % — AB (ref 39.0–52.0)
HEMOGLOBIN: 10.9 g/dL — AB (ref 13.0–17.0)
MCH: 28.8 pg (ref 26.0–34.0)
MCHC: 31.8 g/dL (ref 30.0–36.0)
MCV: 90.7 fL (ref 78.0–100.0)
Platelets: 102 10*3/uL — ABNORMAL LOW (ref 150–400)
RBC: 3.78 MIL/uL — ABNORMAL LOW (ref 4.22–5.81)
RDW: 15.7 % — AB (ref 11.5–15.5)
WBC: 6.6 10*3/uL (ref 4.0–10.5)

## 2016-03-19 LAB — HEPARIN INDUCED PLATELET AB (HIT ANTIBODY): HEPARIN INDUCED PLT AB: 0.664 {OD_unit} — AB (ref 0.000–0.400)

## 2016-03-19 LAB — SODIUM, URINE, RANDOM: Sodium, Ur: 33 mmol/L

## 2016-03-19 LAB — CREATININE, URINE, RANDOM: Creatinine, Urine: 112.52 mg/dL

## 2016-03-19 MED ORDER — POTASSIUM CHLORIDE CRYS ER 20 MEQ PO TBCR
30.0000 meq | EXTENDED_RELEASE_TABLET | Freq: Two times a day (BID) | ORAL | Status: DC
  Administered 2016-03-19 – 2016-03-21 (×4): 30 meq via ORAL
  Filled 2016-03-19 (×4): qty 1

## 2016-03-19 MED ORDER — CEFUROXIME AXETIL 250 MG PO TABS
500.0000 mg | ORAL_TABLET | Freq: Two times a day (BID) | ORAL | Status: DC
  Administered 2016-03-19 – 2016-03-23 (×8): 500 mg via ORAL
  Filled 2016-03-19 (×9): qty 2

## 2016-03-19 NOTE — Progress Notes (Signed)
Subjective: Interval History: has complaints of feeling weak and poor appetite. He denies any nausea or vomiting. Patient also denies any diarrhea..  Objective: Vital signs in last 24 hours: Temp:  [97.3 F (36.3 C)-98.2 F (36.8 C)] 98.2 F (36.8 C) (04/13 29560638) Pulse Rate:  [52-70] 70 (04/13 0638) Resp:  [18-24] 18 (04/13 0638) BP: (85-118)/(45-68) 118/68 mmHg (04/13 0638) SpO2:  [98 %-100 %] 100 % (04/13 21300638) Weight change:   Intake/Output from previous day: 04/12 0701 - 04/13 0700 In: 1468.6 [I.V.:1468.6] Out: 850 [Urine:850] Intake/Output this shift:    General appearance: alert, cooperative and no distress Resp: clear to auscultation bilaterally Cardio: regular rate and rhythm, S1, S2 normal, no murmur, click, rub or gallop GI: soft, non-tender; bowel sounds normal; no masses,  no organomegaly Extremities: extremities normal, atraumatic, no cyanosis or edema  Lab Results:  Recent Labs  03/18/16 1123 03/19/16 0633  WBC 6.7 6.6  HGB 11.5* 10.9*  HCT 35.8* 34.3*  PLT 86* 102*   BMET:  Recent Labs  03/18/16 1123 03/19/16 0633  NA 154* 150*  K 3.2* 3.4*  CL 118* 115*  CO2 29 27  GLUCOSE 225* 244*  BUN 65* 58*  CREATININE 2.88* 2.47*  CALCIUM 8.7* 8.5*   No results for input(s): PTH in the last 72 hours. Iron Studies: No results for input(s): IRON, TIBC, TRANSFERRIN, FERRITIN in the last 72 hours.  Studies/Results: [facility]  03/18/2016  CLINICAL DATA:  Acute kidney insufficiency, horseshoe kidney EXAM: RENAL / URINARY TRACT ULTRASOUND COMPLETE COMPARISON:  CT scan 03/11/2016 FINDINGS: Kidneys: Again noted horseshoe kidney deformity. The visualized upper pole of the right kidney measures about 7.2 cm in length. Visualized upper pole and midpole of the left kidney measures 11.3 cm in length. No hydronephrosis. There is a calcified calculus in lower pole region of the left kidney measures 1.1 cm. There is a cyst in midpole of the left kidney measures 1.1 cm.  Mild increased renal echogenicity probable due to medical renal disease. Bladder: The urinary bladder is collapsed with Foley catheter. IMPRESSION: Again noted horseshoe kidney deformity. The visualized upper pole of the right kidney measures about 7.2 cm in length. Visualized upper pole and midpole of the left kidney measures 11.3 cm in length. No hydronephrosis. There is a calcified calculus in lower pole region of the left kidney measures 1.1 cm. There is a cyst in midpole of the left kidney measures 1.1 cm. Mild increased renal echogenicity probable due to medical renal disease. Foley catheter within decompressed urinary bladder. Electronically Signed   By: Natasha MeadLiviu  Pop M.D.   On: 03/18/2016 10:26    I have reviewed the patient's current medications.  Assessment/Plan: Problem #1 acute kidney injury superimposed on chronic. Presently his BUN and creatinine is improving. Patient is non-oliguric. Most likely secondary to prerenal syndrome however other etiologies cannot be ruled out. Problem #2 chronic renal failure: Stage III. Etiology was thought to be secondary to diabetes/hypertension/ischemic. Patient also with a horse shoe kidney. His right kidney is about 7.2 and left one  about 11.7 with slight increase in equal density. At this moment interstitial nephritis also need to thousand differential diagnosis. Problem #3 hyper natremia colon secondary to lack of free water. Presently his sodium is improving. Problem #4 hypokalemia: Potassium 3.4 low but improving Problem #5 history of diabetes: His blood sugar is high. Problem #6 anemia Problem #7 history of BPH status post TURP. Plan: We'll continue his present management 2] we'll check renal panel in the  morning 3] we'll do iron studies in the morning.  4] we will increase KCl 30 mEq by mouth twice a day     LOS: 3 days   Nevaeh Casillas S 03/19/2016,8:49 AM

## 2016-03-19 NOTE — Progress Notes (Signed)
Garrett Bradley.  PROGRESS NOTE  Charlynne PanderJames C Morine BJY:[id number] DOB: 05/13/1934 DOA: 03/16/2016 PCP: Colette RibasGOLDING, [name], MD  Summary: 82yom presented to the emergency department with history of constipation and weakness, poor oral intake and early satiety. Poor eating for 3 weeks, family concerned about depression. Admitted for acute kidney injury, hypernatremia, failure to thrive, constipation, depression. He was evaluated by psychiatry in felt to be candidate for outpatient treatment and started on Celexa.  Assessment/Plan: 1. Acute kidney injury superimposed on stage III chronic kidney disease. Some improvement today. Appreciate nephrology. Likely multifactorial including dehydration, poor oral intake, bladder outlet obstruction secondary to BPH. Renal ultrasound again shows horseshoe deformity, medical renal disease suggested based on appearance. 2. Bladder outlet obstruction secondary to BPH. Chronic issue. Patient self catheterizes at home. 3. Hypernatremia, slowly improving. 4. Thrombocytopenia, etiology and significance unclear. Improving, though still decreased. Improving. HIT panel pending. 5. Diabetes mellitus normally on insulin. Blood sugars stable.  6. Transient hypotension with defecation 4/10.Transient hypotension again 4/12 when drinking soda. Etiology and significance unclear. No neurologic deficits. Follow clinically. 7. Hypothyroidism. Continue Synthroid. 8. Failure to thrive. 9. Constipation. Last bowel movement 48 hours ago. Continue on MiraLAX. 10. Depression. Patient was evaluated by psychiatry and was not felt to be candidate for inpatient treatment. Continue on Celexa. Follow-up with psychiatry as an outpatient. 11. Obesity unspecified 12. Severe malnutrition   Overall appears stable  Hopefully home in 2-3 days as renal fxn and hyponatremia improve   Continue aggressive bowel regimen  Outpatient follow-up with psychiatry  Code Status: Full DVT prophylaxis: Lovenox Family  Communication: No family present  Disposition Plan: Anticipate discharge in 2-3 days  Brendia Sacksaniel Lennon Boutwell, MD  [facility] Direct contact:  --Via amion app OR  --[email or web address]; password [email or web address] and click  7PM-7AM contact night coverage as above 03/19/2016, 6:45 AM  LOS: 3 days   Consultants:  Nephro  Procedures:  None  Antibiotics:  None  HPI/Subjective: Feels very sleepy today, and slept well last night. He did eat some breakfast. Denies pain, nausea or vomiting today.   Objective: Filed Vitals:   03/18/16 1100 03/18/16 1413 03/18/16 2017 03/19/16 0638  BP: 97/55 107/45 101/54 118/68  Pulse: 64 52 62 70  Temp: 97.3 F (36.3 C) 98.1 F (36.7 C) 98.2 F (36.8 C) 98.2 F (36.8 C)  TempSrc: Oral Oral Oral Oral  Resp: 18 24 20 18   Height:      Weight:      SpO2: 98% 100% 100% 100%    Intake/Output Summary (Last 24 hours) at 03/19/16 0645 Last data filed at 03/19/16 0500  Gross per 24 hour  Intake 1468.58 ml  Output    850 ml  Net 618.58 ml     Filed Weights   03/16/16 1800  Weight: 130 kg (286 lb 9.6 oz)    Exam:    General:  Appears calm and comfortable Cardiovascular: RRR, no m/r/g. No LE edema. Respiratory: CTA bilaterally, no w/r/r. Normal respiratory effort. Musculoskeletal: grossly normal tone BUE/BLE Psychiatric: grossly normal mood and affect, speech fluent and appropriate Neurologic: grossly non-focal.   New data reviewed:  Sodium 150  Urine sodium 33  BUN 58, Creat 2.47, improved  Blood sugars stable  WBC wnl  Hgb 10.9, Htc 34.3, Platelets 102, improved   Scheduled Meds: . citalopram  20 mg Oral Daily  . diltiazem  300 mg Oral Daily  . feeding supplement (GLUCERNA SHAKE)  237 mL Oral BID BM  . ferrous sulfate  325 mg Oral Daily  .  insulin aspart  0-5 Units Subcutaneous QHS  . insulin aspart  0-9 Units Subcutaneous TID WC  . insulin detemir  10 Units Subcutaneous QHS  . latanoprost  1 drop Both Eyes QHS  . levothyroxine   137 mcg Oral QAC breakfast  . megestrol  400 mg Oral Daily  . polyethylene glycol  17 g Oral BID  . potassium chloride SA  20 mEq Oral BID  . senna  1 tablet Oral Daily  . terazosin  10 mg Oral Daily  . cyanocobalamin  2,000 mcg Oral Daily   Continuous Infusions: . dextrose 100 mL/hr at 03/17/16 2117  . dextrose 135 mL/hr at 03/19/16 0546    Active Problems:   Hypothyroidism   BPH (benign prostatic hyperplasia)   Hypertension   Hypokalemia   Failure to thrive in adult   Dehydration   Hypernatremia   Acute renal failure superimposed on stage 3 chronic kidney disease (HCC)   Diabetes mellitus type 2 with complications (HCC)   Depression   Constipation   Protein-calorie malnutrition, severe   Time spent 20 minutes   By signing my name below, I, Adron Bene, attest that this documentation has been prepared under the direction and in the presence of Harmony Sandell P. Irene Limbo, MD. Electronically Signed: Adron Bene, Scribe.  03/19/2016 1:15pm  I personally performed the services described in this documentation. All medical record entries made by the scribe were at my direction. I have reviewed the chart and agree that the record reflects my personal performance and is accurate and complete. Brendia Sacks, MD

## 2016-03-19 NOTE — Progress Notes (Signed)
Inpatient Diabetes Program Recommendations  AACE/ADA: New Consensus Statement on Inpatient Glycemic Control (2015)  Target Ranges:  Prepandial:   less than 140 mg/dL      Peak postprandial:   less than 180 mg/dL (1-2 hours)      Critically ill patients:  140 - 180 mg/dL   Review of Glycemic Control Results for Garrett PanderWITHERS, Shimshon C (MRN [id number]) as of 03/19/2016 12:43  Ref. Range 03/18/2016 11:43 03/18/2016 16:49 03/18/2016 20:17 03/19/2016 07:53 03/19/2016 11:05  Glucose-Capillary Latest Ref Range: 65-99 mg/dL 409191 (H) 811246 (H) 914220 (H) 203 (H) 221 (H)    Diabetes history: DM Type 2 Outpatient Diabetes medications: Levemir 70 units daily + Novolog 10 units tid with meals + Metformin 500 mg bid Current orders for Inpatient glycemic control: Levemir 10 units q hs + Novolog correction scale tid with meals  Inpatient Diabetes Program Recommendations:  Please consider increase in Levemir to 15 units and Novolog correction 0-5 @ hs.  Thank you, Billy FischerJudy E. Aizen Duval, RN, MSN, CDE Inpatient Glycemic Control Team Team Pager [phone] (8am-5pm) 03/19/2016 12:43 PM

## 2016-03-20 ENCOUNTER — Inpatient Hospital Stay (HOSPITAL_COMMUNITY): Payer: Medicare Other

## 2016-03-20 DIAGNOSIS — N183 Chronic kidney disease, stage 3 unspecified: Secondary | ICD-10-CM | POA: Insufficient documentation

## 2016-03-20 DIAGNOSIS — I951 Orthostatic hypotension: Secondary | ICD-10-CM

## 2016-03-20 DIAGNOSIS — N179 Acute kidney failure, unspecified: Secondary | ICD-10-CM

## 2016-03-20 DIAGNOSIS — N390 Urinary tract infection, site not specified: Secondary | ICD-10-CM

## 2016-03-20 DIAGNOSIS — E860 Dehydration: Secondary | ICD-10-CM

## 2016-03-20 LAB — CBC
HCT: 35.4 % — ABNORMAL LOW (ref 39.0–52.0)
Hemoglobin: 11.2 g/dL — ABNORMAL LOW (ref 13.0–17.0)
MCH: 28.4 pg (ref 26.0–34.0)
MCHC: 31.6 g/dL (ref 30.0–36.0)
MCV: 89.8 fL (ref 78.0–100.0)
PLATELETS: 95 10*3/uL — AB (ref 150–400)
RBC: 3.94 MIL/uL — AB (ref 4.22–5.81)
RDW: 15.5 % (ref 11.5–15.5)
WBC: 7 10*3/uL (ref 4.0–10.5)

## 2016-03-20 LAB — RENAL FUNCTION PANEL
ALBUMIN: 2.8 g/dL — AB (ref 3.5–5.0)
Anion gap: 9 (ref 5–15)
BUN: 46 mg/dL — AB (ref 6–20)
CALCIUM: 8.7 mg/dL — AB (ref 8.9–10.3)
CHLORIDE: 112 mmol/L — AB (ref 101–111)
CO2: 27 mmol/L (ref 22–32)
CREATININE: 2.01 mg/dL — AB (ref 0.61–1.24)
GFR, EST AFRICAN AMERICAN: 34 mL/min — AB (ref >60)
GFR, EST NON AFRICAN AMERICAN: 30 mL/min — AB (ref >60)
Glucose, Bld: 194 mg/dL — ABNORMAL HIGH (ref 65–99)
Phosphorus: 3.1 mg/dL (ref 2.5–4.6)
Potassium: 3.5 mmol/L (ref 3.5–5.1)
SODIUM: 148 mmol/L — AB (ref 135–145)

## 2016-03-20 LAB — SEROTONIN RELEASE ASSAY (SRA)
SRA .2 IU/mL UFH Ser-aCnc: 3 % (ref 0–20)
SRA 100IU/mL UFH Ser-aCnc: 1 % (ref 0–20)

## 2016-03-20 LAB — GLUCOSE, CAPILLARY
GLUCOSE-CAPILLARY: 150 mg/dL — AB (ref 65–99)
Glucose-Capillary: 158 mg/dL — ABNORMAL HIGH (ref 65–99)
Glucose-Capillary: 218 mg/dL — ABNORMAL HIGH (ref 65–99)

## 2016-03-20 LAB — IRON AND TIBC
IRON: 27 ug/dL — AB (ref 45–182)
Saturation Ratios: 18 % (ref 17.9–39.5)
TIBC: 148 ug/dL — AB (ref 250–450)
UIBC: 121 ug/dL

## 2016-03-20 LAB — FERRITIN: FERRITIN: 274 ng/mL (ref 24–336)

## 2016-03-20 MED ORDER — MAGNESIUM CITRATE PO SOLN
1.0000 | Freq: Once | ORAL | Status: AC
  Administered 2016-03-20: 1 via ORAL
  Filled 2016-03-20: qty 296

## 2016-03-20 NOTE — Progress Notes (Signed)
Subjective: Interval History: He still patient complains of poor appetite. He doesn't have any nausea or vomiting. Patient also denies any difficulty breathing.  Objective: Vital signs in last 24 hours: Temp:  [97.8 F (36.6 C)-98.9 F (37.2 C)] 97.9 F (36.6 C) (04/14 0531) Pulse Rate:  [40-66] 58 (04/14 0531) Resp:  [18-20] 20 (04/14 0531) BP: (128-134)/(58-64) 128/64 mmHg (04/14 0531) SpO2:  [95 %-99 %] 95 % (04/14 0531) Weight change:   Intake/Output from previous day: 04/13 0701 - 04/14 0700 In: -  Out: 975 [Urine:975] Intake/Output this shift: Total I/O In: -  Out: 900 [Urine:900]  General appearance: alert, cooperative and no distress Resp: clear to auscultation bilaterally Cardio: regular rate and rhythm, S1, S2 normal, no murmur, click, rub or gallop GI: soft, non-tender; bowel sounds normal; no masses,  no organomegaly Extremities: extremities normal, atraumatic, no cyanosis or edema  Lab Results:  Recent Labs  03/18/16 1123 03/19/16 0633  WBC 6.7 6.6  HGB 11.5* 10.9*  HCT 35.8* 34.3*  PLT 86* 102*   BMET:   Recent Labs  03/19/16 0633 03/20/16 0527  NA 150* 148*  K 3.4* 3.5  CL 115* 112*  CO2 27 27  GLUCOSE 244* 194*  BUN 58* 46*  CREATININE 2.47* 2.01*  CALCIUM 8.5* 8.7*   No results for input(s): PTH in the last 72 hours. Iron Studies: No results for input(s): IRON, TIBC, TRANSFERRIN, FERRITIN in the last 72 hours.  Studies/Results: US Renal  03/18/2016  CLINICAL DATA:  Acute kidney insufficiency, horseshoe kidney EXAM: RENAL / URINARY TRACT ULTRASOUND COMPLETE COMPARISON:  CT scan 03/11/2016 FINDINGS: Kidneys: Again noted horseshoe kidney deformity. The visualized upper pole of the right kidney measures about 7.2 cm in length. Visualized upper pole and midpole of the left kidney measures 11.3 cm in length. No hydronephrosis. There is a calcified calculus in lower pole region of the left kidney measures 1.1 cm. There is a cyst in midpole of the  left kidney measures 1.1 cm. Mild increased renal echogenicity probable due to medical renal disease. Bladder: The urinary bladder is collapsed with Foley catheter. IMPRESSION: Again noted horseshoe kidney deformity. The visualized upper pole of the right kidney measures about 7.2 cm in length. Visualized upper pole and midpole of the left kidney measures 11.3 cm in length. No hydronephrosis. There is a calcified calculus in lower pole region of the left kidney measures 1.1 cm. There is a cyst in midpole of the left kidney measures 1.1 cm. Mild increased renal echogenicity probable due to medical renal disease. Foley catheter within decompressed urinary bladder. Electronically Signed   By: Natasha Mead M.D.   On: 03/18/2016 10:26    I have reviewed the patient's current medications.  Assessment/Plan: Problem #1 acute kidney injury superimposed on chronic. His renal function is progressively improving. Patient is non-oliguric. Problem #2 chronic renal failure: Stage III. Etiology was thought to be secondary to diabetes/hypertension/ischemic. Patient also with a horse shoe kidney. His right kidney is about 7.2 and left one  about 11.7 with slight increase in equal density. At this moment interstitial nephritis also need to thousand differential diagnosis. Problem #3 hyper natremia colon secondary to lack of free water. His sodium is 148 and improving. Problem #4 hypokalemia: Potassium 3.5 has corrected. Problem #5 history of diabetes: His blood sugar is high. Problem #6 anemia Problem #7 history of BPH status post TURP. Plan: We'll encourage patient to increase his water intake. 2] we'll check renal panel in the morning 3] we'll  continue with present treatment.     LOS: 4 days   Queen Abbett S 03/20/2016,8:44 AM

## 2016-03-20 NOTE — Evaluation (Signed)
Physical Therapy Evaluation Patient Details Name: Garrett Bradley MRN: [id number] DOB: 1934/03/14 Today's Date: 03/20/2016   History of Present Illness  Garrett Bradley is a 82 y.o. male who is brought into the ER by his family due to constipation and weakness. Patient was seen in the ER earlier this week for constipation. He was treated with an enema and did have a bowel movement prior to discharge. He reports continued constipation, decreased by mouth intake. He says he becomes full very quickly after trying to eat. Denies any vomiting. No fever, cough or shortness of breath. He has chronic bladder outlet obstruction and self catheterizes at home. Family reports that he has not been eating well for over 3 weeks now. Patient does admit to feeling more depressed. He would normally enjoy watching NASCAR, but has not watched any races and quite some time. He reports losing his wife in September 2016. Since that time he feels that things have gone downhill. He has not contacted his physicians regarding any symptoms of depression. Denies any suicidal or homicidal ideations.  Clinical Impression  Pt received in bed, son present, and pt was agreeable to PT evaluation.  Pt able to perform bed mobility at Mod (I) level, but required Min A with RW for sit<>stand transfer.  Orthostatic vitals recorded during transfers, and are as follows: Supine: 143/61, HR: 60bpm, SpO2: 99% on 2L,  Sitting: 120/59, HR: 74bpm, and SpO2 99% on RA, and Standing: 87/44 with c/o dizziness and blurred vision, HR: 88bpm, and SpO2: 100% on RA, Return to supine: 131/58, HR: 60, SpO2: 97% on RA.  PT tx limited due to orthostatic findings, and RN notified.  Pt currently lives alone, and is only able to have daily checks from a neighbor, who also assists him with running errands since he does not drive.  At this time, he is recommended for HHPT upon d/c, but pending on his progress, and ability to receive assistance at home, he may need SNF.       Follow Up Recommendations Home health PT    Equipment Recommendations       Recommendations for Other Services       Precautions / Restrictions Restrictions Weight Bearing Restrictions: No      Mobility  Bed Mobility Overal bed mobility: Modified Independent             General bed mobility comments: Pt required increased time for supine<> sit and use of bed rail to perform transfer.   Transfers Overall transfer level: Needs assistance Equipment used: Rolling walker (2 wheeled) Transfers: Sit to/from Stand Sit to Stand: Min assist         General transfer comment: Pt rocked back and forth to gain momentum in order stand, however still required Min A to raise up, and to steady himself.  Pt able to stand long enough to complete orthostatic BP, which dropped to 87/44 in standing.  Tx then stopped due to BP, and pt returned to supine in bed with BP stabilizing at 131/58.  Ambulation/Gait Ambulation/Gait assistance:  (NT due to orthostatic hypotension)              Stairs            Wheelchair Mobility    Modified Rankin (Stroke Patients Only)       Balance Overall balance assessment: Needs assistance Sitting-balance support: No upper extremity supported Sitting balance-Leahy Scale: Good     Standing balance support: Single extremity supported Standing balance-Leahy Scale: Fair  Pertinent Vitals/Pain Pain Assessment: 0-10 Pain Score: 5  Pain Location: R UE - RN aware of swelling.  Pain Descriptors / Indicators: Tightness Pain Intervention(s): Limited activity within patient's tolerance;Repositioned    Home Living Family/patient expects to be discharged to:: Private residence Living Arrangements: Alone Available Help at Discharge: Neighbor (Pt no longer drives, and his neighbor assists with running errands and grocery shopping. ) Type of Home: House Home Access: Ramped entrance     Home Layout:  Two level;Laundry or work area in basement;Able to live on main level with bedroom/bathroom Home Equipment: Environmental consultantWalker - 2 wheels;Cane - single point;Crutches Additional Comments: but pt was not currenly using any of the AD's.     Prior Function Level of Independence: Independent         Comments: Pt relies of assistance from his neighbor to drive and help him run errands.      Hand Dominance        Extremity/Trunk Assessment   Upper Extremity Assessment: RUE deficits/detail;LUE deficits/detail RUE Deficits / Details: Not fully assessed due to painful swelling noted.  Per RN, doppler (-)     LUE Deficits / Details: WFL   Lower Extremity Assessment: Generalized weakness         Communication   Communication: No difficulties  Cognition Arousal/Alertness: Awake/alert Behavior During Therapy: WFL for tasks assessed/performed Overall Cognitive Status: Within Functional Limits for tasks assessed                      General Comments      Exercises        Assessment/Plan    PT Assessment Patient needs continued PT services  PT Diagnosis Generalized weakness   PT Problem List Decreased strength;Decreased range of motion;Decreased activity tolerance;Decreased balance;Decreased mobility;Decreased safety awareness;Decreased knowledge of use of DME;Cardiopulmonary status limiting activity;Pain  PT Treatment Interventions Gait training;Stair training;DME instruction;Functional mobility training;Therapeutic activities;Therapeutic exercise;Balance training;Patient/family education   PT Goals (Current goals can be found in the Care Plan section) Acute Rehab PT Goals Patient Stated Goal: Pt wants to feel better.  PT Goal Formulation: With patient/family Time For Goal Achievement: 04/03/16 Potential to Achieve Goals: Good    Frequency Min 3X/week   Barriers to discharge Decreased caregiver support Pt lives alone, and is only able to have a neighbor check in on him  daily.     Co-evaluation               End of Session Equipment Utilized During Treatment: Gait belt Activity Tolerance: Patient tolerated treatment well Patient left: in bed;with call bell/phone within reach;with family/visitor present Nurse Communication: Mobility status (RN notified of orthostatic BP's and pt's position. )    Functional Assessment Tool Used: Clincal Judgement.  Functional Limitation: Mobility: Walking and moving around Mobility: Walking and Moving Around Current Status [phone](G8978): At least 40 percent but less than 60 percent impaired, limited or restricted Mobility: Walking and Moving Around Goal Status [phone](G8979): At least 1 percent but less than 20 percent impaired, limited or restricted    Time: [id number] PT Time Calculation (min) (ACUTE ONLY): 25 min   Charges:   PT Evaluation $PT Eval Moderate Complexity: 1 Procedure PT Treatments $Therapeutic Activity: 8-22 mins   PT G Codes:   PT G-Codes **NOT FOR INPATIENT CLASS** Functional Assessment Tool Used: Clincal Judgement.  Functional Limitation: Mobility: Walking and moving around Mobility: Walking and Moving Around Current Status [phone](G8978): At least 40 percent but less than 60 percent impaired, limited or restricted  Mobility: Walking and Moving Around Goal Status [phone]): At least 1 percent but less than 20 percent impaired, limited or restricted    Carollee Herter, PT, DPT X: [id number]   03/20/2016, 12:30 PM

## 2016-03-20 NOTE — Progress Notes (Signed)
Garrett Bradley  PROGRESS NOTE  VANDER KUEKER ZOX:[id number] DOB: July 15, 1934 DOA: 03/16/2016 PCP: Colette Ribas, MD  Summary: 82yom presented to the emergency department with history of constipation and weakness, poor oral intake and early satiety. Poor eating for 3 weeks, family concerned about depression. Admitted for acute kidney injury, hypernatremia, failure to thrive, constipation, depression. He was evaluated by psychiatry in felt to be candidate for outpatient treatment and started on Celexa.  Assessment/Plan: 1. Acute kidney injury superimposed on stage III chronic kidney disease. Gradually improving with management, IV fluids as per nephrology. Likely multifactorial including dehydration, poor oral intake, bladder outlet obstruction secondary to BPH. Renal ultrasound revealed a horseshoe deformity, medical renal disease. 2. Bladder outlet obstruction secondary to BPH. Patient normally self catheters sinuses. At this point felt best to leave Foley catheter in place until outpatient follow-up. 3. Hypernatremia secondary to dehydration and poor oral intake. Gradually improving with fluids. 4. UTI. Culture pending. Continue Ceftin. 5. Thrombocytopenia. Remains stable. Etiology, significance unclear. HIT antibody is mildly positive but serotonin release assay is negative. Will discuss interpretation with hematology. 6. Diabetes mellitus. Stable. 7. Orthostatic hypotension. Third episode of orthostasis and transient encephalopathy, seems to be associated with either defecation or position change. Today he had significant blood pressure drop with change in position. No neurologic deficits. Possibly secondary to Hytrin which will be discontinued. We'll hold diltiazem today as well. 8. Right upper extremity edema secondary to IV infiltration. Ultrasound of the right upper extremity negative for DVT. 9. Hypothyroidism. Continue Synthroid. 10. Failure to thrive. 11. Constipation. Last bowel movement >48  hours ago.  12. Depression. Patient was evaluated by psychiatry and was not felt to be candidate for inpatient treatment. Continue on Celexa. Follow-up with psychiatry as an outpatient. 13. Obesity unspecified 14. Severe malnutrition   Appears stable, renal function and hypernatremia improving. Platelet count is stable.  We'll stop Hytrin given his recurrent orthostasis. Given his well-controlled blood pressure will also start diltiazem for now. Place TED hose. No history of dysrhythmias.  Magnesium citrate in addition of bowel regimen  Physical therapy evaluation  Outpatient follow-up with psychiatry  Code Status: Full DVT prophylaxis: Lovenox Family Communication: Discussed with son at bedside Disposition Plan: Anticipate discharge in 1-2 days  Brendia Sacks, MD  [facility] Direct contact:  --Via amion app OR  --[email or web address]; password [email or web address] and click  7PM-7AM contact night coverage as above 03/20/2016, 6:21 AM  LOS: 4 days   Consultants:  Nephro  Procedures:  None  Antibiotics:  None  HPI/Subjective: Patient reports that he does not feel well, that his left arm hurts. Also complains of swelling in the right arm, noted by nurses well.  Per nurse, pt began staring off and was unresponsive when changing positions from bed to chair. Systolic blood pressure was noted to have dropped 40 points at that time.  Objective: Filed Vitals:   03/19/16 0857 03/19/16 1510 03/19/16 2119 03/20/16 0531  BP:  134/58 131/59 128/64  Pulse: 40 66 63 58  Temp:  97.8 F (36.6 C) 98.9 F (37.2 C) 97.9 F (36.6 C)  TempSrc:  Oral Oral Oral  Resp:  Height:      Weight:      SpO2: 96% 99% 99% 95%    Intake/Output Summary (Last 24 hours) at 03/20/16 0621 Last data filed at 03/20/16 0532  Gross per 24 hour  Intake      0 ml  Output    975 ml  Net   -  975 ml     Filed Weights   03/16/16 1800  Weight: 130 kg (286 lb 9.6 oz)    Exam:  General:  Sleepy  but arousable, follow simple commands Eyes: PERRL, normal lids, irises ENT: grossly normal hearing, lips, tongue Cardiovascular: Regular rate and rhythm, no murmur, rub or gallop. 1+ BLE edema, 2-3+ RUE edema Respiratory: Clear to auscultation bilaterally, no wheezes, rales or rhonchi. Normal respiratory effort. Musculoskeletal: grossly normal tone bilateral upper and lower extremities Psychiatric: grossly normal mood and affect, speech fluent and appropriate Neurologic: grossly non-focal.  New data reviewed:  Sodium 148, Improved  BUN 46, Creat 2.01, significant improvement  Blood sugars stable  WBC wnl  Plat count 95 CBG stable   Scheduled Meds: . cefUROXime  500 mg Oral BID WC  . citalopram  20 mg Oral Daily  . diltiazem  300 mg Oral Daily  . feeding supplement (GLUCERNA SHAKE)  237 mL Oral BID BM  . ferrous sulfate  325 mg Oral Daily  . insulin aspart  0-5 Units Subcutaneous QHS  . insulin aspart  0-9 Units Subcutaneous TID WC  . insulin detemir  10 Units Subcutaneous QHS  . latanoprost  1 drop Both Eyes QHS  . levothyroxine  137 mcg Oral QAC breakfast  . megestrol  400 mg Oral Daily  . polyethylene glycol  17 g Oral BID  . potassium chloride SA  30 mEq Oral BID  . senna  1 tablet Oral Daily  . terazosin  10 mg Oral Daily  . cyanocobalamin  2,000 mcg Oral Daily   Continuous Infusions: . dextrose 135 mL/hr at 03/19/16 1247    Principal Problem:   AKI (acute kidney injury) (HCC) Active Problems:   Hypothyroidism   BPH (benign prostatic hyperplasia)   Hypertension   CKD (chronic kidney disease) stage 3, GFR 30-59 ml/min   UTI (lower urinary tract infection)   Failure to thrive in adult   Thrombocytopenia, unspecified (HCC)   Dehydration   Hypernatremia   Diabetes mellitus type 2 with complications (HCC)   Depression   Constipation   Protein-calorie malnutrition, severe   Orthostatic hypotension   Time spent 20 minutes   By signing my name below, I,  Adron BeneGreylon Gawaluck, attest that this documentation has been prepared under the direction and in the presence of Cing Bel-Ridge P. Irene LimboGoodrich, MD. Electronically Signed: Adron BeneGreylon Gawaluck, Scribe.  03/20/2016  9:54am  I personally performed the services described in this documentation. All medical record entries made by the scribe were at my direction. I have reviewed the chart and agree that the record reflects my personal performance and is accurate and complete. Brendia Sacksaniel Zyrell Carmean, MD

## 2016-03-20 NOTE — Care Management Important Message (Signed)
Important Message  Patient Details  Name: Garrett Bradley MRN: [id number] Date of Birth: 08/16/1934   Medicare Important Message Given:  Yes    Adonis HugueninBerkhead, Ian Cavey L, RN 03/20/2016, 11:34 AM

## 2016-03-20 NOTE — Progress Notes (Addendum)
Assisted patient from bed to chair. Once patient was seated in chair, he became unresponsive and had a blank stare. Patient came back around slowly and was able to answer questions appropriately in less than five minutes.  Vital signs HR 55 Bp 136/114. Paged to notify and he arrived to room to see patient.  Right arm swollen and painful also. Physician aware. Will hold Cardizem today per Dr. Irene LimboGoodrich verbal order.

## 2016-03-20 NOTE — Care Management (Signed)
[facility] to [facility]. Referral placed with Abby

## 2016-03-20 NOTE — Care Management Note (Addendum)
Case Management Note  Patient Details  Name: Charlynne PanderJames C Willhoite MRN: [id number] Date of Birth: 11/03/1934  Subjective/Objective:     Patient is alert and orient from home and answers questions appropriately.   Stated to me that he has a cane , crutches and a walker in the home but that he rarely uses them, unless he is "feeling weak" Stated that he does still drive but that it is only to the mailbox mostly. Patient has the support of his cousin and son and stated "they Treat me pretty good"  No Home o2. Last hospitalization was about a year ago and patient stated that he has been "going down" since.        Will need home health   PT and RN . Offered choice of HH providers. [facility]. [facility]  Action/Plan: Home with Home Health.  Expected Discharge Date:  03/18/16               Expected Discharge Plan:  Home w Home Health Services  In-House Referral:     Discharge planning Services  CM Consult  Post Acute Care Choice:    Choice offered to:  Patient  DME Arranged:    [facility]  HH Arranged[facility]  [facility]  Status of Service:  In process, will continue to follow  Medicare Important Message Given:  Yes Date Medicare IM Given:    Medicare IM give by:    Date Additional Medicare IM Given:    Additional Medicare Important Message give by:     If discussed at Long Length of Stay Meetings, dates discussed:    Additional Comments:  Adonis HugueninBerkhead, Hannelore Bova L, RN 03/20/2016, 3:54 PM

## 2016-03-21 LAB — CBC
HEMATOCRIT: 36.3 % — AB (ref 39.0–52.0)
Hemoglobin: 11.5 g/dL — ABNORMAL LOW (ref 13.0–17.0)
MCH: 28.4 pg (ref 26.0–34.0)
MCHC: 31.7 g/dL (ref 30.0–36.0)
MCV: 89.6 fL (ref 78.0–100.0)
PLATELETS: 103 10*3/uL — AB (ref 150–400)
RBC: 4.05 MIL/uL — ABNORMAL LOW (ref 4.22–5.81)
RDW: 15.6 % — AB (ref 11.5–15.5)
WBC: 8.3 10*3/uL (ref 4.0–10.5)

## 2016-03-21 LAB — GLUCOSE, CAPILLARY
GLUCOSE-CAPILLARY: 165 mg/dL — AB (ref 65–99)
GLUCOSE-CAPILLARY: 196 mg/dL — AB (ref 65–99)
Glucose-Capillary: 181 mg/dL — ABNORMAL HIGH (ref 65–99)
Glucose-Capillary: 159 mg/dL — ABNORMAL HIGH (ref 65–99)
Glucose-Capillary: 192 mg/dL — ABNORMAL HIGH (ref 65–99)

## 2016-03-21 LAB — BASIC METABOLIC PANEL
ANION GAP: 6 (ref 5–15)
BUN: 43 mg/dL — AB (ref 6–20)
CO2: 29 mmol/L (ref 22–32)
Calcium: 8.6 mg/dL — ABNORMAL LOW (ref 8.9–10.3)
Chloride: 113 mmol/L — ABNORMAL HIGH (ref 101–111)
Creatinine, Ser: 1.8 mg/dL — ABNORMAL HIGH (ref 0.61–1.24)
GFR calc Af Amer: 39 mL/min — ABNORMAL LOW (ref >60)
GFR, EST NON AFRICAN AMERICAN: 34 mL/min — AB (ref >60)
GLUCOSE: 192 mg/dL — AB (ref 65–99)
POTASSIUM: 4.2 mmol/L (ref 3.5–5.1)
Sodium: 148 mmol/L — ABNORMAL HIGH (ref 135–145)

## 2016-03-21 MED ORDER — MAGNESIUM CITRATE PO SOLN
1.0000 | Freq: Once | ORAL | Status: DC
  Filled 2016-03-21: qty 296

## 2016-03-21 MED ORDER — MILK AND MOLASSES ENEMA
1.0000 | Freq: Once | RECTAL | Status: AC
  Administered 2016-03-21: 250 mL via RECTAL

## 2016-03-21 NOTE — Progress Notes (Signed)
Garrett Bradley  PROGRESS NOTE  Garrett Bradley Bradley ZOX:[id number] DOB: 08-20-1934 DOA: 03/16/2016 PCP: Garrett Ribas, MD  Summary: 82yom presented to the emergency department with history of constipation and weakness, poor oral intake and early satiety. Poor eating for 3 weeks, family concerned about depression. Admitted for acute kidney injury, hypernatremia, failure to thrive, constipation, depression. He was evaluated by psychiatry in felt to be candidate for outpatient treatment and started on Celexa. Renal function has gradually improved with IV fluids and management per nephrology. He was evaluated by physical therapy with recommendations for home health. One issue complicating hospitalization has been recurrent orthostatic hypotension. Hopefully home next 48 hours.  Assessment/Plan: 1. Acute kidney injury superimposed on stage III chronic kidney disease. Continues to gradually improve. Appreciate nephrology intervention. 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 2. Bladder outlet obstruction secondary to BPH. Normally self catheterizes at home. Leave Foley in place. 3. Hypernatremia secondary to dehydration and poor oral intake. Continues to improve. 4. UTI. Culture pending. Continue Ceftin. 5. Orthostatic hypotension, severe. Hytrin stopped. Diltiazem discontinued. TED hose were placed. Monitor fluid intake. 6. Mild thrombocytopenia remains stable. Serotonin release assay was negative. Hit antibody mildly positive, significance unclear. We'll discuss with hematology. 7. Diabetes mellitus remains stable. 8. Right upper extremity edema secondary to IV infiltration. Improved. No evidence of complicating features. Ultrasound right upper extremity negative for DVT. 9. Hypothyroidism. Continue Synthroid. 10. Constipation. Has not yet responded, currently on MiraLAX, senna and dose of magnesium citrate 4/14. Some stool in vault. Trial enema. 11. Depression. Patient was evaluated  by psychiatry and was not felt to be candidate for inpatient treatment. Continue on Celexa. Follow-up with psychiatry as an outpatient. 12. Obesity unspecified 13. Severe malnutrition   Appears stable, renal function and hypernatremia improving. Platelet count is stable.  Continue management per nephro, check BMP in morning  Disimpaction, proceed again with magnesium citrate-patient refused. Will try an enema.-  Outpatient follow-up with psychiatryRecommended   Likely home next 48 hrs with Home healthPhysical therapy   Code Status: Full DVT prophylaxis: Lovenox Family Communication: Discussed with patient Disposition Plan: Anticipate discharge within 24 hrs  Garrett Sacks, MD  [facility] Direct contact:  --Via amion app OR  --[email or web address]; password [email or web address] and click  7PM-7AM contact night coverage as above 03/21/2016, 6:08 AM  LOS: 5 days   Consultants:  Nephro  Procedures:  None  Antibiotics:  None  HPI/Subjective: Feels slightly better today. Breathing feels poor, SOB. Is not eating much. Denies nausea. Arm is sore. Denies any BM and rare flatulance  Objective: Filed Vitals:   03/20/16 1500 03/20/16 1554 03/20/16 2237 03/21/16 0604  BP: 140/65  146/70 158/64  Pulse: 61  61 61  Temp: 98.5 F (36.9 C)  98.6 F (37 C) 98.9 F (37.2 C)  TempSrc: Oral  Oral Oral  Resp: Height:      Weight:      SpO2: 97% 100% 99% 100%    Intake/Output Summary (Last 24 hours) at 03/21/16 8119 Last data filed at 03/21/16 0604  Gross per 24 hour  Intake    600 ml  Output   2200 ml  Net  -1600 ml     Filed Weights   03/16/16 1800  Weight: 130 kg (286 lb 9.6 oz)    Exam:  General:  Appears calm and comfortabl Cardiovascular: RRR, no m/r/g. No LE edema. Respiratory: CTA bilaterally, no w/r/r. Normal respiratory effort. Abdomen: soft, ntnd Musculoskeletal: 2-3+ edema  right upper extremity, improved. Strong radial pulse, grip is intact. ROM  limited by pain. Psychiatric: grossly normal mood and affect, speech fluent and appropriate  New data reviewed:  Sodium 148, stable  BUN 43, Cr 1.8, continue to trend down  Glucose 192, CBG 159  WBC wnl  UOP 2200  Plat stable 103, hgb stable 11.5   Scheduled Meds: . cefUROXime  500 mg Oral BID WC  . citalopram  20 mg Oral Daily  . feeding supplement (GLUCERNA SHAKE)  237 mL Oral BID BM  . ferrous sulfate  325 mg Oral Daily  . insulin aspart  0-5 Units Subcutaneous QHS  . insulin aspart  0-9 Units Subcutaneous TID WC  . insulin detemir  10 Units Subcutaneous QHS  . latanoprost  1 drop Both Eyes QHS  . levothyroxine  137 mcg Oral QAC breakfast  . megestrol  400 mg Oral Daily  . polyethylene glycol  17 g Oral BID  . potassium chloride SA  30 mEq Oral BID  . senna  1 tablet Oral Daily  . cyanocobalamin  2,000 mcg Oral Daily   Continuous Infusions: . dextrose 135 mL/hr at 03/19/16 1247    Principal Problem:   AKI (acute kidney injury) (HCC) Active Problems:   Hypothyroidism   BPH (benign prostatic hyperplasia)   Hypertension   CKD (chronic kidney disease) stage 3, GFR 30-59 ml/min   UTI (lower urinary tract infection)   Failure to thrive in adult   Thrombocytopenia, unspecified (HCC)   Dehydration   Hypernatremia   Diabetes mellitus type 2 with complications (HCC)   Depression   Constipation   Protein-calorie malnutrition, severe   Orthostatic hypotension   Time spent 20 minutes   By signing my name below, I, Garrett Bradley, attest that this documentation has been prepared under the direction and in the presence of Garrett P. Irene LimboGoodrich, MD. Electronically Signed: Adron BeneGreylon Bradley, Scribe.  03/21/2016  1:00pm  I personally performed the services described in this documentation. All medical record entries made by the scribe were at my direction. I have reviewed the chart and agree that the record reflects my personal performance and is accurate and  complete. Garrett Sacksaniel Goodrich, MD

## 2016-03-21 NOTE — Progress Notes (Signed)
Subjective: Interval History: Patient is still complains of not feeling better but does not give any specific complaints. He denies any nausea or vomiting.  Objective: Vital signs in last 24 hours: Temp:  [98.5 F (36.9 C)-98.9 F (37.2 C)] 98.9 F (37.2 C) (04/15 0604) Pulse Rate:  [60-61] 61 (04/15 0604) Resp:  [20] 20 (04/15 0604) BP: (131-158)/(58-70) 158/64 mmHg (04/15 0604) SpO2:  [97 %-100 %] 100 % (04/15 0604) Weight change:   Intake/Output from previous day: 04/14 0701 - 04/15 0700 In: 600 [P.O.:600] Out: 2200 [Urine:2200] Intake/Output this shift: Total I/O In: 120 [P.O.:120] Out: -   General appearance: alert, cooperative and no distress Resp: clear to auscultation bilaterally Cardio: regular rate and rhythm, S1, S2 normal, no murmur, click, rub or gallop GI: soft, non-tender; bowel sounds normal; no masses,  no organomegaly Extremities: extremities normal, atraumatic, no cyanosis or edema  Lab Results:  Recent Labs  03/20/16 0527 03/21/16 0553  WBC 7.0 8.3  HGB 11.2* 11.5*  HCT 35.4* 36.3*  PLT 95* 103*   BMET:   Recent Labs  03/20/16 0527 03/21/16 0553  NA 148* 148*  K 3.5 4.2  CL 112* 113*  CO2 27 29  GLUCOSE 194* 192*  BUN 46* 43*  CREATININE 2.01* 1.80*  CALCIUM 8.7* 8.6*   No results for input(Bradley): PTH in the last 72 hours. Iron Studies:   Recent Labs  03/20/16 0527  IRON 27*  TIBC 148*  FERRITIN 274    Studies/Results: US Venous Img Upper Uni Right  03/20/2016  CLINICAL DATA:  82 year old male with a history of right upper extremity edema. EXAM: RIGHT UPPER EXTREMITY VENOUS DOPPLER ULTRASOUND TECHNIQUE: Gray-scale sonography with graded compression, as well as color Doppler and duplex ultrasound were performed to evaluate the upper extremity deep venous system from the level of the subclavian vein and including the jugular, axillary, basilic, radial, ulnar and upper cephalic vein. Spectral Doppler was utilized to evaluate flow at  rest and with distal augmentation maneuvers. COMPARISON:  None. FINDINGS: Contralateral Subclavian Vein: Respiratory phasicity is normal and symmetric with the symptomatic side. No evidence of thrombus. Normal compressibility. Internal Jugular Vein: No evidence of thrombus. Normal compressibility, respiratory phasicity and response to augmentation. Subclavian Vein: No evidence of thrombus. Normal compressibility, respiratory phasicity and response to augmentation. Axillary Vein: No evidence of thrombus. Normal compressibility, respiratory phasicity and response to augmentation. Cephalic Vein: No evidence of thrombus. Normal compressibility, respiratory phasicity and response to augmentation. Basilic Vein: No evidence of thrombus. Normal compressibility, respiratory phasicity and response to augmentation. Brachial Veins: No evidence of thrombus. Normal compressibility, respiratory phasicity and response to augmentation. Radial Veins: No evidence of thrombus. Normal compressibility, respiratory phasicity and response to augmentation. Ulnar Veins: No evidence of thrombus. Normal compressibility, respiratory phasicity and response to augmentation. Other Findings:  None visualized. IMPRESSION: Sonographic survey of the right upper extremity negative for DVT. Signed, Yvone Neu. Loreta Ave, DO [facility] Electronically Signed   By: Gilmer Mor D.O.   On: 03/20/2016 11:29    I have reviewed the patient'Bradley current medications.  Assessment/Plan: Problem #1 acute kidney injury superimposed on chronic. His renal function continued to improve. His creatinine is still above his baseline. Problem #2 chronic renal failure: Stage III. Etiology was thought to be secondary to diabetes/hypertension/ischemic. Patient also with a horse shoe kidney. His right kidney is about 7.2 and left one  about 11.7 with slight increase in equal density. At this moment interstitial  nephritis  also need to thousand differential diagnosis. Problem #3 hyper natremia : His sodium is still 148. His IV was discontinued because of lack of access. Patient by mouth intake still is very limited. Problem #4 hypokalemia: Potassium is normal. Patient is on potassium supplement. Problem #5 history of diabetes: His blood sugar is high. Problem #6 anemia: His hemoglobin is stable. Problem #7 history of BPH status post TURP. Plan: We'll encourage patient to increase his water intake. If not able to drink then we may need to start him back on his IV fluid. 2] we'll check renal panel in the morning 3] we'll DC potassium supplement.     LOS: 5 days   Garrett Bradley 03/21/2016,10:17 AM

## 2016-03-22 ENCOUNTER — Inpatient Hospital Stay (HOSPITAL_COMMUNITY): Payer: Medicare Other

## 2016-03-22 DIAGNOSIS — F4321 Adjustment disorder with depressed mood: Secondary | ICD-10-CM

## 2016-03-22 LAB — URINE CULTURE

## 2016-03-22 LAB — RENAL FUNCTION PANEL
ANION GAP: 8 (ref 5–15)
Albumin: 2.4 g/dL — ABNORMAL LOW (ref 3.5–5.0)
BUN: 35 mg/dL — ABNORMAL HIGH (ref 6–20)
CALCIUM: 9 mg/dL (ref 8.9–10.3)
CHLORIDE: 113 mmol/L — AB (ref 101–111)
CO2: 28 mmol/L (ref 22–32)
Creatinine, Ser: 1.57 mg/dL — ABNORMAL HIGH (ref 0.61–1.24)
GFR calc non Af Amer: 40 mL/min — ABNORMAL LOW (ref >60)
GFR, EST AFRICAN AMERICAN: 46 mL/min — AB (ref >60)
GLUCOSE: 160 mg/dL — AB (ref 65–99)
POTASSIUM: 4.4 mmol/L (ref 3.5–5.1)
Phosphorus: 2.7 mg/dL (ref 2.5–4.6)
SODIUM: 149 mmol/L — AB (ref 135–145)

## 2016-03-22 LAB — GLUCOSE, CAPILLARY
GLUCOSE-CAPILLARY: 138 mg/dL — AB (ref 65–99)
GLUCOSE-CAPILLARY: 157 mg/dL — AB (ref 65–99)
GLUCOSE-CAPILLARY: 172 mg/dL — AB (ref 65–99)
Glucose-Capillary: 150 mg/dL — ABNORMAL HIGH (ref 65–99)

## 2016-03-22 LAB — URIC ACID: Uric Acid, Serum: 10.4 mg/dL — ABNORMAL HIGH (ref 4.4–7.6)

## 2016-03-22 MED ORDER — DEXTROSE-NACL 5-0.45 % IV SOLN
INTRAVENOUS | Status: DC
Start: 2016-03-22 — End: 2016-03-23
  Administered 2016-03-22 – 2016-03-23 (×2): via INTRAVENOUS

## 2016-03-22 NOTE — Progress Notes (Signed)
[facility] PROGRESS NOTE  Garrett Bradley:[id number] DOB: [birth year] DOA: 03/16/2016 PCP: Colette Ribas, MD  Brief narrative 82 year old male with history of stage III CKD, bladder outlet obstruction secondary to BPH on chronic Foley, diabetes mellitus, presented to the ED with constipation, generalized weakness with poor by mouth intake for past few weeks. Patient found to be hypernatremic with acute kidney injury on presentation. Also found to be orthostatic. Renal following.  Assessment/Plan: Acute on chronic kidney disease stage III Prerenal secondary to poor by mouth intake and dehydration. Slowly improving and close to baseline. Still has poor by mouth intake. Appreciate renal consult. Renal ultrasound shows partial deformity in medical renal disease.  Hypernatremia Secondary to dehydration with poor by mouth intake. I would place him back on D5 with half-normal saline. Encouraged increased water intake.   UTI Cultures growing Proteus. On empiric Ceftin. Follow sensitivity.  Orthostatic hypotension, severe Discontinued Cardizem and Hytrin. Resumed IV fluids. Continue TED hose.  Failure to thrive with depression and poor by mouth intake Seen by telemetry psych. Recommended not a candidate for inpatient treatment. Started on Celexa. Needs outpatient psych follow-up.  Severe protein calorie malnutrition Added supplements.  Right upper extremity swelling and edema Initially thought to be due to IV infiltration. Doppler negative for DVT. Patient complains of pain in his wrist, forearm and weakness in his shoulder. Right wrist is tender to palpation. Check uric acid level and x-ray of the wrist.  Diabetes mellitus type 2 Continue bedtime Lantus (dose reduced due to poor by mouth intake) and sliding scale coverage.  Hypothyroidism Continue Synthroid. TSH normal.  Constipation Continue MiraLAX, senna and Colace. Received enema with good results on 4/15  Mild  thrombocytopenia Stable.   DVT prophylaxis: Subcutaneous Lovenox Diet: Regular with supplements.  Code Status: Full code Family Communication: Daughter at bedside Disposition Plan: PT recommended home health and was planned so initially but upon my discussion with daughter patient lives alone and has had very poor mobility and with his underlying depression his by mouth intake has been very poor for past few weeks. His ex-wife was helping him with his meals but is not longer doing so. Given his current condition and acute worsening he will benefit from going to skilled nursing facility until he is stable to live independently. Discussed with patient and his daughter both agree with short-term rehabilitation. Social work consulted   Consultants:  Renal  Procedures:  Ultrasound kidney  Doppler right upper extremity  Antibiotics:  Ceftin since 4/13  HPI/Subjective: Seen and examined. Still has poor by mouth intake. Complains of pain in his right arm (especially the wrist). Had enema with good bowel movement yesterday.  Objective: Filed Vitals:   03/21/16 2042 03/22/16 0621  BP: 172/76 160/89  Pulse: 60 65  Temp: 99.3 F (37.4 C) 99 F (37.2 C)  Resp: 16 16    Intake/Output Summary (Last 24 hours) at 03/22/16 1250 Last data filed at 03/22/16 0900  Gross per 24 hour  Intake    684 ml  Output   1751 ml  Net  -1067 ml   Filed Weights   03/16/16 1800  Weight: 130 kg (286 lb 9.6 oz)    Exam:   General:  Elderly male appears fatigued with poor effect  HEENT: No pallor, moist mucosa, supple neck  Cardiovascular: Normal S1 and S2, no murmurs rub or gallop  Respiratory: Clear bilaterally  Abdomen: Soft, nondistended, nontender, bowel sounds present, chronic Foley  Musculoskeletal: Warm, no edema, right arm  swelling with tenderness on the wrist  CNS: Flat affect, alert and oriented  Data Reviewed: Basic Metabolic Panel:  Recent Labs Lab 03/18/16 1123  03/19/16 0633 03/20/16 0527 03/21/16 0553 03/22/16 0543  NA 154* 150* 148* 148* 149*  K 3.2* 3.4* 3.5 4.2 4.4  CL 118* 115* 112* 113* 113*  CO2 29 27 27 29 28   GLUCOSE 225* 244* 194* 192* 160*  BUN 65* 58* 46* 43* 35*  CREATININE 2.88* 2.47* 2.01* 1.80* 1.57*  CALCIUM 8.7* 8.5* 8.7* 8.6* 9.0  PHOS  --   --  3.1  --  2.7   Liver Function Tests:  Recent Labs Lab 03/16/16 1349 03/20/16 0527 03/22/16 0543  AST 44*  --   --   ALT 42  --   --   ALKPHOS 53  --   --   BILITOT 1.0  --   --   PROT 8.4*  --   --   ALBUMIN 3.5 2.8* 2.4*   No results for input(s): LIPASE, AMYLASE in the last 168 hours. No results for input(s): AMMONIA in the last 168 hours. CBC:  Recent Labs Lab 03/16/16 1349 03/16/16 1943 03/18/16 1123 03/19/16 0633 03/20/16 0527 03/21/16 0553  WBC 9.5 9.1 6.7 6.6 7.0 8.3  NEUTROABS 7.5  --   --   --   --   --   HGB 13.5 14.0 11.5* 10.9* 11.2* 11.5*  HCT 42.7 44.2 35.8* 34.3* 35.4* 36.3*  MCV 92.4 91.5 90.9 90.7 89.8 89.6  PLT 141* 124* 86* 102* 95* 103*   Cardiac Enzymes: No results for input(s): CKTOTAL, CKMB, CKMBINDEX, TROPONINI in the last 168 hours. BNP (last 3 results) No results for input(s): BNP in the last 8760 hours.  ProBNP (last 3 results) No results for input(s): PROBNP in the last 8760 hours.  CBG:  Recent Labs Lab 03/21/16 1138 03/21/16 1646 03/21/16 2044 03/22/16 0744 03/22/16 1119  GLUCAP 165* 196* 192* 138* 150*    Recent Results (from the past 240 hour(s))  Culture, Urine     Status: Abnormal (Preliminary result)   Collection Time: 03/19/16  6:35 AM  Result Value Ref Range Status   Specimen Description URINE, CATHETERIZED  Final   Special Requests NONE  Final   Culture >=100,000 COLONIES/mL PROTEUS MIRABILIS (A)  Final   Report Status PENDING  Incomplete     Studies: No results found.  Scheduled Meds: . cefUROXime  500 mg Oral BID WC  . citalopram  20 mg Oral Daily  . feeding supplement (GLUCERNA SHAKE)  237  mL Oral BID BM  . ferrous sulfate  325 mg Oral Daily  . insulin aspart  0-5 Units Subcutaneous QHS  . insulin aspart  0-9 Units Subcutaneous TID WC  . insulin detemir  10 Units Subcutaneous QHS  . latanoprost  1 drop Both Eyes QHS  . levothyroxine  137 mcg Oral QAC breakfast  . magnesium citrate  1 Bottle Oral Once  . megestrol  400 mg Oral Daily  . polyethylene glycol  17 g Oral BID  . senna  1 tablet Oral Daily  . cyanocobalamin  2,000 mcg Oral Daily   Continuous Infusions: . dextrose 5 % and 0.45% NaCl        Time spent: 25 minutes    Masayo Fera  [facility] Pager [phone]. If 7PM-7AM, please contact night-coverage at [email or web address], password [email or web address] 03/22/2016, 12:50 PM  LOS: 6 days

## 2016-03-22 NOTE — Progress Notes (Signed)
Pt's daughter has indicated that she is interested in [facility] [place] for any rehabilitation services he may need.

## 2016-03-22 NOTE — Progress Notes (Signed)
Pt has refused his medications. Pt has been reeducated on the importance of his medications.

## 2016-03-22 NOTE — Progress Notes (Signed)
Garrett Bradley  MRN: [id number]  DOB/AGE: 12-28-1933 82 y.o.  Primary Care Physician:[name], [name], MD  Admit date: 03/16/2016  Chief Complaint:  Chief Complaint  Patient presents with  . Constipation    S-Pt presented on  03/16/2016 with  Chief Complaint  Patient presents with  . Constipation  .    Pt main concern is " I feel weak"    Meds  . cefUROXime  500 mg Oral BID WC  . citalopram  20 mg Oral Daily  . feeding supplement (GLUCERNA SHAKE)  237 mL Oral BID BM  . ferrous sulfate  325 mg Oral Daily  . insulin aspart  0-5 Units Subcutaneous QHS  . insulin aspart  0-9 Units Subcutaneous TID WC  . insulin detemir  10 Units Subcutaneous QHS  . latanoprost  1 drop Both Eyes QHS  . levothyroxine  137 mcg Oral QAC breakfast  . magnesium citrate  1 Bottle Oral Once  . megestrol  400 mg Oral Daily  . polyethylene glycol  17 g Oral BID  . senna  1 tablet Oral Daily  . cyanocobalamin  2,000 mcg Oral Daily     Physical Exam: Vital signs in last 24 hours: Temp:  [98.4 F (36.9 C)-99.3 F (37.4 C)] 98.4 F (36.9 C) (04/16 1447) Pulse Rate:  [50-65] 50 (04/16 1447) Resp:  [16] 16 (04/16 1447) BP: (160-172)/(71-89) 164/71 mmHg (04/16 1447) SpO2:  [100 %] 100 % (04/16 1447) Weight change:  Last BM Date: 03/21/16  Intake/Output from previous day: 04/15 0701 - 04/16 0700 In: 564 [P.O.:564] Out: 1751 [Urine:1750; Stool:1] Total I/O In: 240 [P.O.:240] Out: -    Physical Exam: General- pt is awake,alert, oriented to time place and person Resp- No acute REsp distress, decreased Bs at base. CVS- S1S2 regular in rate and rhythm GIT- BS+, soft, NT, ND EXT- NO LE Edema, Cyanosis   Lab Results: CBC  Recent Labs  03/20/16 0527 03/21/16 0553  WBC 7.0 8.3  HGB 11.2* 11.5*  HCT 35.4* 36.3*  PLT 95* 103*    BMET  Recent Labs  03/21/16 0553 03/22/16 0543  NA 148* 149*  K 4.2 4.4  CL 113* 113*  CO2 29 28  GLUCOSE 192* 160*  BUN 43* 35*  CREATININE  1.80* 1.57*  CALCIUM 8.6* 9.0    Creat 2017 2.3--2.4=>1.8=>1.57 2016 1.8 2015 1.3--2.4 2014 1.1--1.7 2012 1.3--3.2 ( AKI ) 2010 1.1--1.7 ( AKI )   Sodium 2017  160=>149    MICRO Recent Results (from the past 240 hour(s))  Culture, Urine     Status: Abnormal   Collection Time: 03/19/16  6:35 AM  Result Value Ref Range Status   Specimen Description URINE, CATHETERIZED  Final   Special Requests NONE  Final   Culture >=100,000 COLONIES/mL PROTEUS MIRABILIS (A)  Final   Report Status 03/22/2016 FINAL  Final   Organism ID, Bacteria PROTEUS MIRABILIS (A)  Final      Susceptibility   Proteus mirabilis - MIC*    AMPICILLIN <=2 SENSITIVE Sensitive     CEFAZOLIN <=4 SENSITIVE Sensitive     CEFTRIAXONE <=1 SENSITIVE Sensitive     CIPROFLOXACIN <=0.25 SENSITIVE Sensitive     GENTAMICIN <=1 SENSITIVE Sensitive     IMIPENEM <=0.25 SENSITIVE Sensitive     NITROFURANTOIN 64 RESISTANT Resistant     TRIMETH/SULFA <=20 SENSITIVE Sensitive     AMPICILLIN/SULBACTAM <=2 SENSITIVE Sensitive     PIP/TAZO <=4 SENSITIVE Sensitive     * >=100,000 COLONIES/mL  PROTEUS MIRABILIS      Lab Results  Component Value Date   CALCIUM 9.0 03/22/2016   CAION 1.17 10/19/2011   PHOS 2.7 03/22/2016               Impression: 1)Renal AKI secondary to Prerena/ATN/Post renal  AKI sec to Hypovolemia  AKI sec to post renal issuies  AKI on CKD  CKD stage 3.  CKD since 2010  CKD secondary to   Low glomerular mass- horse shoe kidney  Nephrolithiasis  Age associated decline    Progression of CKD marked with multiple AKI     2)HTN  BP at goal  3)Anemia HGb at goal (9--11)   4)GI- admitted with constipation Primary team following   5)Electrolytes  Normokalemic  Hypernatremic   On D5 1/2 NS  6)Acid base Co2  at goal    Plan:  Will continue current care Will follow BMET.     BHUTANI,MANPREET S 03/22/2016, 4:36 PM

## 2016-03-23 ENCOUNTER — Inpatient Hospital Stay
Admission: RE | Admit: 2016-03-23 | Discharge: 2016-05-08 | Disposition: A | Discharge status: Home / Self Care | Payer: Medicare Other | Source: Ambulatory Visit | Attending: Internal Medicine | Admitting: Internal Medicine

## 2016-03-23 DIAGNOSIS — E1165 Type 2 diabetes mellitus with hyperglycemia: Secondary | ICD-10-CM | POA: Diagnosis not present

## 2016-03-23 DIAGNOSIS — R600 Localized edema: Secondary | ICD-10-CM | POA: Diagnosis not present

## 2016-03-23 DIAGNOSIS — I519 Heart disease, unspecified: Secondary | ICD-10-CM | POA: Diagnosis not present

## 2016-03-23 DIAGNOSIS — D508 Other iron deficiency anemias: Secondary | ICD-10-CM | POA: Diagnosis not present

## 2016-03-23 DIAGNOSIS — K5900 Constipation, unspecified: Secondary | ICD-10-CM | POA: Diagnosis not present

## 2016-03-23 DIAGNOSIS — D696 Thrombocytopenia, unspecified: Secondary | ICD-10-CM | POA: Diagnosis not present

## 2016-03-23 DIAGNOSIS — M25531 Pain in right wrist: Secondary | ICD-10-CM | POA: Diagnosis not present

## 2016-03-23 DIAGNOSIS — I77810 Thoracic aortic ectasia: Secondary | ICD-10-CM | POA: Diagnosis not present

## 2016-03-23 DIAGNOSIS — H2513 Age-related nuclear cataract, bilateral: Secondary | ICD-10-CM | POA: Diagnosis not present

## 2016-03-23 DIAGNOSIS — E860 Dehydration: Secondary | ICD-10-CM | POA: Diagnosis not present

## 2016-03-23 DIAGNOSIS — D631 Anemia in chronic kidney disease: Secondary | ICD-10-CM | POA: Diagnosis not present

## 2016-03-23 DIAGNOSIS — M25532 Pain in left wrist: Secondary | ICD-10-CM | POA: Diagnosis not present

## 2016-03-23 DIAGNOSIS — M7989 Other specified soft tissue disorders: Secondary | ICD-10-CM

## 2016-03-23 DIAGNOSIS — R627 Adult failure to thrive: Secondary | ICD-10-CM | POA: Diagnosis not present

## 2016-03-23 DIAGNOSIS — R531 Weakness: Secondary | ICD-10-CM | POA: Diagnosis not present

## 2016-03-23 DIAGNOSIS — N179 Acute kidney failure, unspecified: Secondary | ICD-10-CM | POA: Diagnosis not present

## 2016-03-23 DIAGNOSIS — I429 Cardiomyopathy, unspecified: Secondary | ICD-10-CM | POA: Diagnosis not present

## 2016-03-23 DIAGNOSIS — N401 Enlarged prostate with lower urinary tract symptoms: Secondary | ICD-10-CM | POA: Diagnosis not present

## 2016-03-23 DIAGNOSIS — N400 Enlarged prostate without lower urinary tract symptoms: Secondary | ICD-10-CM | POA: Diagnosis not present

## 2016-03-23 DIAGNOSIS — N183 Chronic kidney disease, stage 3 (moderate): Secondary | ICD-10-CM | POA: Diagnosis not present

## 2016-03-23 DIAGNOSIS — I82432 Acute embolism and thrombosis of left popliteal vein: Secondary | ICD-10-CM | POA: Diagnosis not present

## 2016-03-23 DIAGNOSIS — E039 Hypothyroidism, unspecified: Secondary | ICD-10-CM | POA: Diagnosis not present

## 2016-03-23 DIAGNOSIS — E1121 Type 2 diabetes mellitus with diabetic nephropathy: Secondary | ICD-10-CM | POA: Diagnosis not present

## 2016-03-23 DIAGNOSIS — E119 Type 2 diabetes mellitus without complications: Secondary | ICD-10-CM | POA: Diagnosis not present

## 2016-03-23 DIAGNOSIS — N189 Chronic kidney disease, unspecified: Secondary | ICD-10-CM | POA: Diagnosis not present

## 2016-03-23 DIAGNOSIS — D649 Anemia, unspecified: Secondary | ICD-10-CM | POA: Diagnosis not present

## 2016-03-23 DIAGNOSIS — I82403 Acute embolism and thrombosis of unspecified deep veins of lower extremity, bilateral: Principal | ICD-10-CM

## 2016-03-23 DIAGNOSIS — F329 Major depressive disorder, single episode, unspecified: Secondary | ICD-10-CM | POA: Diagnosis not present

## 2016-03-23 DIAGNOSIS — M25571 Pain in right ankle and joints of right foot: Secondary | ICD-10-CM | POA: Diagnosis not present

## 2016-03-23 DIAGNOSIS — M1000 Idiopathic gout, unspecified site: Secondary | ICD-10-CM | POA: Diagnosis not present

## 2016-03-23 DIAGNOSIS — N390 Urinary tract infection, site not specified: Secondary | ICD-10-CM | POA: Diagnosis not present

## 2016-03-23 DIAGNOSIS — R609 Edema, unspecified: Secondary | ICD-10-CM | POA: Diagnosis not present

## 2016-03-23 DIAGNOSIS — Z794 Long term (current) use of insulin: Secondary | ICD-10-CM | POA: Diagnosis not present

## 2016-03-23 DIAGNOSIS — A192 Acute miliary tuberculosis, unspecified: Secondary | ICD-10-CM | POA: Diagnosis not present

## 2016-03-23 DIAGNOSIS — M79632 Pain in left forearm: Secondary | ICD-10-CM | POA: Diagnosis not present

## 2016-03-23 DIAGNOSIS — I10 Essential (primary) hypertension: Secondary | ICD-10-CM | POA: Diagnosis not present

## 2016-03-23 DIAGNOSIS — E870 Hyperosmolality and hypernatremia: Secondary | ICD-10-CM | POA: Diagnosis not present

## 2016-03-23 DIAGNOSIS — R262 Difficulty in walking, not elsewhere classified: Secondary | ICD-10-CM | POA: Diagnosis not present

## 2016-03-23 DIAGNOSIS — E118 Type 2 diabetes mellitus with unspecified complications: Secondary | ICD-10-CM | POA: Diagnosis not present

## 2016-03-23 DIAGNOSIS — R278 Other lack of coordination: Secondary | ICD-10-CM | POA: Diagnosis not present

## 2016-03-23 DIAGNOSIS — H401134 Primary open-angle glaucoma, bilateral, indeterminate stage: Secondary | ICD-10-CM | POA: Diagnosis not present

## 2016-03-23 DIAGNOSIS — Z8639 Personal history of other endocrine, nutritional and metabolic disease: Secondary | ICD-10-CM | POA: Diagnosis not present

## 2016-03-23 DIAGNOSIS — M6281 Muscle weakness (generalized): Secondary | ICD-10-CM | POA: Diagnosis not present

## 2016-03-23 DIAGNOSIS — I82402 Acute embolism and thrombosis of unspecified deep veins of left lower extremity: Secondary | ICD-10-CM | POA: Diagnosis not present

## 2016-03-23 LAB — GLUCOSE, CAPILLARY
GLUCOSE-CAPILLARY: 175 mg/dL — AB (ref 65–99)
GLUCOSE-CAPILLARY: 198 mg/dL — AB (ref 65–99)
Glucose-Capillary: 176 mg/dL — ABNORMAL HIGH (ref 65–99)

## 2016-03-23 LAB — BASIC METABOLIC PANEL
ANION GAP: 7 (ref 5–15)
BUN: 31 mg/dL — ABNORMAL HIGH (ref 6–20)
CALCIUM: 8.7 mg/dL — AB (ref 8.9–10.3)
CO2: 28 mmol/L (ref 22–32)
Chloride: 113 mmol/L — ABNORMAL HIGH (ref 101–111)
Creatinine, Ser: 1.33 mg/dL — ABNORMAL HIGH (ref 0.61–1.24)
GFR, EST AFRICAN AMERICAN: 57 mL/min — AB (ref >60)
GFR, EST NON AFRICAN AMERICAN: 49 mL/min — AB (ref >60)
GLUCOSE: 200 mg/dL
GLUCOSE: 206 mg/dL — AB (ref 65–99)
POTASSIUM: 3.9 mmol/L (ref 3.5–5.1)
SODIUM: 148 mmol/L — AB (ref 135–145)

## 2016-03-23 MED ORDER — CITALOPRAM HYDROBROMIDE 20 MG PO TABS: 20.0000 mg | ORAL_TABLET | Freq: Every day | ORAL | Status: DC

## 2016-03-23 MED ORDER — ALPRAZOLAM 1 MG PO TABS: 1.0000 mg | ORAL_TABLET | Freq: Every evening | ORAL | Status: DC | PRN

## 2016-03-23 MED ORDER — INSULIN DETEMIR 100 UNIT/ML FLEXPEN: 15.0000 [IU] | PEN_INJECTOR | Freq: Every day | SUBCUTANEOUS | Status: DC

## 2016-03-23 MED ORDER — PREDNISONE 20 MG PO TABS
40.0000 mg | ORAL_TABLET | Freq: Every day | ORAL | Status: DC
  Administered 2016-03-23: 40 mg via ORAL
  Filled 2016-03-23: qty 2

## 2016-03-23 MED ORDER — GLUCERNA SHAKE PO LIQD: 237.0000 mL | Freq: Two times a day (BID) | ORAL | Status: DC

## 2016-03-23 MED ORDER — PREDNISONE 20 MG PO TABS: 40.0000 mg | ORAL_TABLET | Freq: Every day | ORAL | Status: DC

## 2016-03-23 NOTE — Plan of Care (Signed)
Problem: Nutrition: Goal: Adequate nutrition will be maintained Outcome: Not Progressing Pt has very poor appetite. States that he does not want anything to eat. Currently eating less than 10% of food and very little fluid intake even with encouragement. Pt states that he does want to eat anything.

## 2016-03-23 NOTE — Progress Notes (Signed)
Marland Kitchen.  PROGRESS NOTE  Charlynne PanderJames C Forst WJX:[id number] DOB: 05/29/1934 DOA: 03/16/2016 PCP: Colette RibasGOLDING, [name], MD  Summary: 82yom presented to the emergency department with history of constipation and weakness, poor oral intake and early satiety. Poor eating for 3 weeks, family concerned about depression. Admitted for acute kidney injury, hypernatremia, failure to thrive, constipation, depression. He was evaluated by psychiatry in felt to be candidate for outpatient treatment and started on Celexa. Renal function has gradually improved with IV fluids and management per nephrology. He was evaluated by physical therapy with recommendations for home health. One issue complicating hospitalization has been recurrent orthostatic hypotension.   Assessment/Plan: 1. Acute kidney injury superimposed on stage III chronic kidney disease. Approaching baseline. 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 2. Bladder outlet obstruction secondary to BPH. Self catheterizes at home. Leave Foley in place. Outpatient urology follow-up. 3. Hypernatremia secondary to dehydration and poor oral intake. Overall improving. Near normal. 4. UTI Proteus mirabilis. Treated with Ceftin. 5. Right upper extremity swelling with right wrist pain; exam suggestive of gout. Uric acid elevated. Right upper extremity venous ultrasound was negative for DVT. Initially thought secondary to IV infiltration. No evidence of complicating features. 6. Orthostatic hypotension, severe. Appears resolved. Hytrin stopped. TED hose were placed. Monitor fluid intake. 7. Mild thrombocytopenia remains stable. Serotonin release assay was negative. Hit antibody mildly positive, significance unclear. He appears stable. 8. Diabetes mellitus remains stable. 9. Right upper extremity edema with right rist pain. secondary to IV infiltration. Improved. No evidence of complicating features. Ultrasound right upper extremity  negative for DVT. Suspect gout on exam as supported by elevated uric acid level. 10. Hypothyroidism. Stable. Continue Synthroid. 11. Constipation. Enema on 4/15 produced good results. 12. Depression. Patient was evaluated by psychiatry and was not felt to be candidate for inpatient treatment. Continue on Celexa. Follow-up with psychiatry as an outpatient. 13. Obesity unspecified 14. Severe malnutrition   Overall continues to improve, renal function appears to be a very close to baseline. Expect spontaneous resolution.  Continue management per nephro, check BMP in morning  Completes antibiotics today  Start on prednisone for empiric tx of gout. Follow response.  Continue aggressive bowel regimen  Resume diltiazem  Outpatient follow-up with psychiatry recommended   Transfer skilled nursing facility for short-term rehabilitation.  Code Status: Full DVT prophylaxis: Lovenox Family Communication: Discussed with patient Disposition Plan: Anticipate discharge within 24 hrs  Brendia Sacksaniel Saamir Armstrong, MD  [facility] Direct contact:  --Via amion app OR  --[email or web address]; password [email or web address] and click  7PM-7AM contact night coverage as above 03/23/2016, 6:22 AM  LOS: 7 days   Consultants:  Nephro  Procedures:  None  Antibiotics:  Ceftin 4/13 >> 4/17  HPI/Subjective: Does not feel well. He complains of nausea. Reports pain in his wrist. His breathing is fine.   Objective: Filed Vitals:   03/22/16 0621 03/22/16 1447 03/22/16 2226 03/23/16 0615  BP: 160/89 164/71 147/75 177/89  Pulse: 65 50 55 58  Temp: 99 F (37.2 C) 98.4 F (36.9 C) 98.3 F (36.8 C) 98 F (36.7 C)  TempSrc: Oral Oral Oral Oral  Resp: 16 16 16 16   Height:      Weight:      SpO2: 100% 100%  98%    Intake/Output Summary (Last 24 hours) at 03/23/16 0622 Last data filed at 03/23/16 0617  Gross per 24 hour  Intake  607.5 ml  Output    500 ml  Net  107.5 ml  Filed Weights   03/16/16 1800    Weight: 130 kg (286 lb 9.6 oz)    Exam: General:  Appears calm and comfortable Cardiovascular: RRR, no m/r/g. No LE edema.  Respiratory: CTA bilaterally, no w/r/r. Normal respiratory effort. Abdomen: soft, ntnd Musculoskeletal: 2-3+ edema in right upper extremity. No erythema. No pain in upper arm or elbow, significant pain with manipulation of wrist. Non-tender. Normal tone and strength in RUE. Strong radial pulse. ROM limited by pain. Able to lift arm at the shoulder and elbow although he is reluctant to do so. Psychiatric: Appears depressed. Neurologic: Grossly nonfocal  New data reviewed:  Blood sugars stable  BUN and creat continue to trend towards nml  Sodium minimally elevated  Right wrist xray no abnormalities noted.   Uric acid elevated.   Scheduled Meds: . cefUROXime  500 mg Oral BID WC  . citalopram  20 mg Oral Daily  . feeding supplement (GLUCERNA SHAKE)  237 mL Oral BID BM  . ferrous sulfate  325 mg Oral Daily  . insulin aspart  0-5 Units Subcutaneous QHS  . insulin aspart  0-9 Units Subcutaneous TID WC  . insulin detemir  10 Units Subcutaneous QHS  . latanoprost  1 drop Both Eyes QHS  . levothyroxine  137 mcg Oral QAC breakfast  . magnesium citrate  1 Bottle Oral Once  . megestrol  400 mg Oral Daily  . polyethylene glycol  17 g Oral BID  . senna  1 tablet Oral Daily  . cyanocobalamin  2,000 mcg Oral Daily   Continuous Infusions: . dextrose 5 % and 0.45% NaCl 75 mL/hr at 03/23/16 0249    Principal Problem:   AKI (acute kidney injury) (HCC) Active Problems:   Hypothyroidism   BPH (benign prostatic hyperplasia)   Hypertension   CKD (chronic kidney disease) stage 3, GFR 30-59 ml/min   UTI (lower urinary tract infection)   Failure to thrive in adult   Thrombocytopenia, unspecified (HCC)   Dehydration   Hypernatremia   Diabetes mellitus type 2 with complications (HCC)   Depression   Constipation   Protein-calorie malnutrition, severe    Orthostatic hypotension     By signing my name below, I, Adron Bene, attest that this documentation has been prepared under the direction and in the presence of Ulyses Panico P. Irene Limbo, MD. Electronically Signed: Adron Bene, Scribe.  03/23/2016  10:55am   I personally performed the services described in this documentation. All medical record entries made by the scribe were at my direction. I have reviewed the chart and agree that the record reflects my personal performance and is accurate and complete. Brendia Sacks, MD

## 2016-03-23 NOTE — Discharge Summary (Signed)
Physician Discharge Summary  Garrett Bradley EAV:[id number] DOB: 12-Apr-1934 DOA: 03/16/2016  PCP: Colette Ribas, MD  Admit date: 03/16/2016 Discharge date: 03/23/2016  Recommendations for Outpatient Follow-up:   1. Follow-up depression. Outpatient therapy recommended. Started on SSRI this admission. 2. Follow-up mild hypernatremia. Follow-up failure to thrive and poor oral intake. 3. Bladder outlet obstruction secondary to BPH, chronic. Foley catheter currently in place. 4. Recommend outpatient follow-up with urology. 5. Suspected right wrist gout with associated edema. Trial of prednisone. Follow clinically. 6. Follow-up orthostatic hypotension. Hytrin discontinued. Recommend compression hose. 7. Mild thrombocytopenia. Consider repeat CBC in 1 week. 8. Diabetes mellitus. Insulin dose significantly decreased given decreased oral intake. 9. Constipation. Needs aggressive bowel regimen.  Follow-up Information    Follow up with [facility].   Specialty:  Skilled Nursing Facility   Contact information:   [address] [place] [address] [phone]      Follow up with Colette Ribas, MD. Schedule an appointment as soon as possible for a visit in 2 weeks.   Specialty:  Family Medicine   Contact information:   [address] [place] [address] [phone]      Discharge Diagnoses:  1. AKI on CKD stage 3 2. Bladder outlet obstruction secondary to BPH 3. Hypernatremia, secondary to dehydration and poor PO intake. 4. UTI Proteus mirabilis 5. Orthostatic hypotenison 6. Right upper extremity edema, suspected gout 7. Mild thrombocytopenia 8. Diabetes mellitus type 2 9. Hypothyroidism 10. Constipation 11. Depression 12. Obesity 13. Severe malnutrition  Discharge Condition: improved Disposition: discharge home   Diet recommendation: carb modified  Filed Weights   03/16/16 1800  Weight: 130 kg (286 lb 9.6 oz)     History of present illness:  82yom presented to the emergency department with history of constipation and weakness, poor oral intake and early satiety. Poor eating for 3 weeks, family concerned about depression. Admitted for acute kidney injury, hypernatremia, failure to thrive, constipation, depression.   Hospital Course:  He was evaluated by psychiatry who felt he was a good candidate for outpatient treatment and he was started on Celexa. Renal function gradually improved with IV fluids and management per nephrology. He is found to have significant orthostatic hypotension and Hytrin was discontinued. Mild thrombocytopenia likely related to infection. Serotonin release assay was negative, discussed with hematology T. Kefalas who reported this rules out the diagnosis. Patient developed right upper extremity edema which was initially thought to be IV infiltration, however developed wrist pain suggestive of gout. Upper extremity venous Doppler was negative for DVT. He was assessed by physical therapy with recommendations for skilled nursing facility placement.  1. Acute kidney injury superimposed on stage III chronic kidney disease. Approaching baseline. 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 2. Bladder outlet obstruction secondary to BPH. Self catheterizes at home. Leave Foley in place. Outpatient urology follow-up. 3. Hypernatremia secondary to dehydration and poor oral intake. Overall improving. Near normal. 4. UTI Proteus mirabilis. Treated with Ceftin. 5. Right upper extremity swelling with right wrist pain; exam suggestive of gout. Uric acid elevated. Right upper extremity venous ultrasound was negative for DVT. Initially thought secondary to IV infiltration. No evidence of complicating features. 6. Orthostatic hypotension, severe. Appears resolved. Hytrin stopped. TED hose were placed. Monitor fluid intake. 7. Mild thrombocytopenia remains  stable. Serotonin release assay was negative. Hit antibody mildly positive, significance unclear. He appears stable. 8. Diabetes mellitus remains stable. 9. Right upper extremity edema with  right rist pain. secondary to IV infiltration. Improved. No evidence of complicating features. Ultrasound right upper extremity negative for DVT. Suspect gout on exam as supported by elevated uric acid level. 10. Hypothyroidism. Stable. Continue Synthroid. 11. Constipation. Enema on 4/15 produced good results. 12. Depression. Patient was evaluated by psychiatry and was not felt to be candidate for inpatient treatment. Continue on Celexa. Follow-up with psychiatry as an outpatient. 13. Obesity unspecified 14. Severe malnutrition  Consultants:  Nephrology  Procedures:  None  Antibiotics:  Ceftin 4/13 >> 4/17  Discharge Instructions   Current Discharge Medication List    START taking these medications   Details  citalopram (CELEXA) 20 MG tablet Take 1 tablet (20 mg total) by mouth daily.    feeding supplement, GLUCERNA SHAKE, (GLUCERNA SHAKE) LIQD Take 237 mLs by mouth 2 (two) times daily between meals. Refills: 0    predniSONE (DELTASONE) 20 MG tablet Take 2 tablets (40 mg total) by mouth daily with breakfast. Stop 4/22.      CONTINUE these medications which have CHANGED   Details  ALPRAZolam (XANAX) 1 MG tablet Take 1 tablet (1 mg total) by mouth at bedtime as needed for sleep. Qty: 10 tablet, Refills: 0    Insulin Detemir (LEVEMIR FLEXTOUCH) 100 UNIT/ML Pen Inject 15 Units into the skin at bedtime.      CONTINUE these medications which have NOT CHANGED   Details  clobetasol ointment (TEMOVATE) 0.05 % Apply 1 application topically daily as needed. For legs    cyanocobalamin 2000 MCG tablet Take 2,000 mcg by mouth daily.    diltiazem (CARDIZEM CD) 300 MG 24 hr capsule Take 300 mg by mouth daily.     ferrous sulfate 325 (65 FE) MG tablet Take 325 mg by mouth daily.     fish  oil-omega-3 fatty acids 1000 MG capsule Take 1 capsule (1 g total) by mouth 2 (two) times daily.    levothyroxine (SYNTHROID, LEVOTHROID) 137 MCG tablet Take 137 mcg by mouth daily.    megestrol (MEGACE) 400 MG/10ML suspension Take 10 mLs (400 mg total) by mouth daily. Qty: 240 mL, Refills: 0    polyethylene glycol powder (GLYCOLAX/MIRALAX) powder Take 17 g by mouth 2 (two) times daily. Until daily soft stools  OTC Qty: 225 g, Refills: 0    Potassium Chloride ER 20 MEQ TBCR Take 1 tablet by mouth 2 (two) times daily.    senna (SENOKOT) 8.6 MG TABS tablet Take 1 tablet (8.6 mg total) by mouth daily. Qty: 120 each, Refills: 0    Travoprost, BAK Free, (TRAVATAN) 0.004 % SOLN ophthalmic solution Place 1 drop into both eyes at bedtime.      STOP taking these medications     furosemide (LASIX) 40 MG tablet      magnesium citrate SOLN      metFORMIN (GLUCOPHAGE) 500 MG tablet      NOVOLOG FLEXPEN 100 UNIT/ML FlexPen      terazosin (HYTRIN) 10 MG capsule        Allergies  Allergen Reactions  . Thiazide-Type Diuretics     Patient was just told not to take, not sure if an actual allergy    The results of significant diagnostics from this hospitalization (including imaging, microbiology, ancillary and laboratory) are listed below for reference.    Significant Diagnostic Studies: Ct Abdomen Pelvis Wo Contrast  03/11/2016  CLINICAL DATA:  Abdominal pain.  No bowel movements for 4 days. EXAM: CT ABDOMEN AND PELVIS WITHOUT CONTRAST TECHNIQUE: Multidetector CT imaging  of the abdomen and pelvis was performed following the standard protocol without IV contrast. COMPARISON:  07/09/2014 FINDINGS: No evidence of bowel obstruction. Oral contrast has progressed through to the ascending colon. Small bowel and stomach are unremarkable. There is a mildly generous volume of colonic stool, without evidence of colonic obstruction. There are unremarkable unenhanced appearances of the liver. There are  multiple calculi within the gallbladder lumen, measuring approximately 7 mm. No bile duct dilatation. The spleen is unremarkable. There probably is a 1.4 cm cystic lesion of the pancreatic tail and this is not significantly changed from 07/09/2014 for 09/16/2011. The pancreas is otherwise unremarkable. The adrenals are unremarkable. There is a horseshoe kidney. There are multiple collecting system calculi. The largest right sided calculus measures 5 mm. The largest left-sided calculus measures 1.5 cm, enlarged from 2015 when it measured 8 mm. There are no ureteral calculi. The urinary bladder is decompressed and cannot be evaluated. The abdominal aorta is normal in caliber with mild atherosclerotic calcification. No significant abnormality is evident in the lower chest. No significant musculoskeletal lesion is evident. Moderately severe degenerative lumbar facet disease is present from L4 through S1. IMPRESSION: 1. No acute findings are evident in the abdomen or pelvis. 2. There is a generous volume of colonic stool, without evidence of obstruction. 3. Cholelithiasis. 4. Nephrolithiasis.  Horseshoe kidney. 5. Small cystic lesion of the pancreatic tail, unchanged from 2012. Electronically Signed   By: Ellery Plunkaniel R Mitchell M.D.   On: 03/11/2016 21:14   Dg Wrist Complete Right  03/22/2016  CLINICAL DATA:  82 year old male with pain and swelling in the right wrist for the past several days. No history of injury. EXAM: RIGHT WRIST - COMPLETE 3+ VIEW COMPARISON:  None. FINDINGS: Multiple views of the right wrist demonstrate no acute displaced fracture, subluxation, dislocation, or soft tissue abnormality. IMPRESSION: No acute radiographic abnormality of the right wrist. Electronically Signed   By: Trudie Reedaniel  Entrikin M.D.   On: 03/22/2016 14:47   [place] Renal  03/18/2016  CLINICAL DATA:  Acute kidney insufficiency, horseshoe kidney EXAM: RENAL / URINARY TRACT ULTRASOUND COMPLETE COMPARISON:  CT scan 03/11/2016 FINDINGS:  Kidneys: Again noted horseshoe kidney deformity. The visualized upper pole of the right kidney measures about 7.2 cm in length. Visualized upper pole and midpole of the left kidney measures 11.3 cm in length. No hydronephrosis. There is a calcified calculus in lower pole region of the left kidney measures 1.1 cm. There is a cyst in midpole of the left kidney measures 1.1 cm. Mild increased renal echogenicity probable due to medical renal disease. Bladder: The urinary bladder is collapsed with Foley catheter. IMPRESSION: Again noted horseshoe kidney deformity. The visualized upper pole of the right kidney measures about 7.2 cm in length. Visualized upper pole and midpole of the left kidney measures 11.3 cm in length. No hydronephrosis. There is a calcified calculus in lower pole region of the left kidney measures 1.1 cm. There is a cyst in midpole of the left kidney measures 1.1 cm. Mild increased renal echogenicity probable due to medical renal disease. Foley catheter within decompressed urinary bladder. Electronically Signed   By: Natasha MeadLiviu  Pop M.D.   On: 03/18/2016 10:26   [place] Venous Img Upper Uni Right  03/20/2016  CLINICAL DATA:  82 year old male with a history of right upper extremity edema. EXAM: RIGHT UPPER EXTREMITY VENOUS DOPPLER ULTRASOUND TECHNIQUE: Gray-scale sonography with graded compression, as well as color Doppler and duplex ultrasound were performed to evaluate the upper extremity deep venous system  from the level of the subclavian vein and including the jugular, axillary, basilic, radial, ulnar and upper cephalic vein. Spectral Doppler was utilized to evaluate flow at rest and with distal augmentation maneuvers. COMPARISON:  None. FINDINGS: Contralateral Subclavian Vein: Respiratory phasicity is normal and symmetric with the symptomatic side. No evidence of thrombus. Normal compressibility. Internal Jugular Vein: No evidence of thrombus. Normal compressibility, respiratory phasicity and response to  augmentation. Subclavian Vein: No evidence of thrombus. Normal compressibility, respiratory phasicity and response to augmentation. Axillary Vein: No evidence of thrombus. Normal compressibility, respiratory phasicity and response to augmentation. Cephalic Vein: No evidence of thrombus. Normal compressibility, respiratory phasicity and response to augmentation. Basilic Vein: No evidence of thrombus. Normal compressibility, respiratory phasicity and response to augmentation. Brachial Veins: No evidence of thrombus. Normal compressibility, respiratory phasicity and response to augmentation. Radial Veins: No evidence of thrombus. Normal compressibility, respiratory phasicity and response to augmentation. Ulnar Veins: No evidence of thrombus. Normal compressibility, respiratory phasicity and response to augmentation. Other Findings:  None visualized. IMPRESSION: Sonographic survey of the right upper extremity negative for DVT. Signed, Yvone Neu. Loreta Ave, DO [facility] Electronically Signed   By: Gilmer Mor D.O.   On: 03/20/2016 11:29   Dg Abd Acute W/chest  03/16/2016  CLINICAL DATA:  Constipation for 3 weeks. EXAM: DG ABDOMEN ACUTE W/ 1V CHEST COMPARISON:  Chest radiographs 03/14/2015. CT abdomen and pelvis 03/11/2016 FINDINGS: The cardiomediastinal silhouette is within normal limits. Slight chronic elevation of the right hemidiaphragm is noted. The lungs are well inflated without evidence of airspace consolidation, edema, pleural effusion, or pneumothorax. No intraperitoneal free air is identified. Residual oral contrast material is present in the proximal colon. A small to at most moderate amount of stool is present in the ascending, transverse, and proximal descending colon. Gas is present in predominantly decompressed distal colon. No dilated loops of bowel suggestive of obstruction are identified. Lumbar spondylosis and renal calculi are noted as  demonstrated on recent CT. IMPRESSION: 1. No evidence of acute cardiopulmonary process. 2. Nonobstructed bowel gas pattern. Residual contrast in the proximal colon. Electronically Signed   By: Sebastian Ache M.D.   On: 03/16/2016 13:30    Microbiology: Recent Results (from the past 240 hour(s))  Culture, Urine     Status: Abnormal   Collection Time: 03/19/16  6:35 AM  Result Value Ref Range Status   Specimen Description URINE, CATHETERIZED  Final   Special Requests NONE  Final   Culture >=100,000 COLONIES/mL PROTEUS MIRABILIS (A)  Final   Report Status 03/22/2016 FINAL  Final   Organism ID, Bacteria PROTEUS MIRABILIS (A)  Final      Susceptibility   Proteus mirabilis - MIC*    AMPICILLIN <=2 SENSITIVE Sensitive     CEFAZOLIN <=4 SENSITIVE Sensitive     CEFTRIAXONE <=1 SENSITIVE Sensitive     CIPROFLOXACIN <=0.25 SENSITIVE Sensitive     GENTAMICIN <=1 SENSITIVE Sensitive     IMIPENEM <=0.25 SENSITIVE Sensitive     NITROFURANTOIN 64 RESISTANT Resistant     TRIMETH/SULFA <=20 SENSITIVE Sensitive     AMPICILLIN/SULBACTAM <=2 SENSITIVE Sensitive     PIP/TAZO <=4 SENSITIVE Sensitive     * >=100,000 COLONIES/mL PROTEUS MIRABILIS     Labs: Basic Metabolic Panel:  Recent Labs Lab 03/19/16 0633 03/20/16 0527 03/21/16 0553 03/22/16 0543 03/23/16 0607  NA 150* 148* 148* 149* 148*  K 3.4* 3.5 4.2 4.4 3.9  CL 115* 112* 113* 113* 113*  CO2 27 27  29 28 28   GLUCOSE 244* 194* 192* 160* 206*  BUN 58* 46* 43* 35* 31*  CREATININE 2.47* 2.01* 1.80* 1.57* 1.33*  CALCIUM 8.5* 8.7* 8.6* 9.0 8.7*  PHOS  --  3.1  --  2.7  --    Liver Function Tests:  Recent Labs Lab 03/20/16 0527 03/22/16 0543  ALBUMIN 2.8* 2.4*   CBC:  Recent Labs Lab 03/16/16 1943 03/18/16 1123 03/19/16 0633 03/20/16 0527 03/21/16 0553  WBC 9.1 6.7 6.6 7.0 8.3  HGB 14.0 11.5* 10.9* 11.2* 11.5*  HCT 44.2 35.8* 34.3* 35.4* 36.3*  MCV 91.5 90.9 90.7 89.8 89.6  PLT 124* 86* 102* 95* 103*    CBG:  Recent  Labs Lab 03/22/16 1119 03/22/16 1557 03/22/16 2222 03/23/16 0759 03/23/16 1143  GLUCAP 150* 157* 172* 175* 198*    Principal Problem:   AKI (acute kidney injury) (HCC) Active Problems:   Hypothyroidism   BPH (benign prostatic hyperplasia)   Hypertension   CKD (chronic kidney disease) stage 3, GFR 30-59 ml/min   UTI (lower urinary tract infection)   Failure to thrive in adult   Thrombocytopenia, unspecified (HCC)   Dehydration   Hypernatremia   Diabetes mellitus type 2 with complications (HCC)   Depression   Constipation   Protein-calorie malnutrition, severe   Orthostatic hypotension   Time coordinating discharge: 35 minutes  Signed:  Brendia Sacks, MD [facility] 03/23/2016, 3:55 PM  By signing my name below, I, Adron Bene, attest that this documentation has been prepared under the direction and in the presence of Calissa Swenor P. Irene Limbo, MD. Electronically Signed: Adron Bene, Scribe.  03/23/2016 12:12pm  I personally performed the services described in this documentation. All medical record entries made by the scribe were at my direction. I have reviewed the chart and agree that the record reflects my personal performance and is accurate and complete. Brendia Sacks, MD

## 2016-03-23 NOTE — Progress Notes (Signed)
Discharged PT per MD order and protocol. [facility] VSS. IV catheter D/C.  Patient wheeled down by staff member. Lesly Dukesachel J Everett, RN

## 2016-03-23 NOTE — Clinical Social Work Note (Signed)
Clinical Social Work Assessment  Patient Details  Name: Garrett Bradley MRN: [id number] Date of Birth: 09/02/1934  Date of referral:  03/23/16               Reason for consult:  Facility Placement                Permission sought to share information with:    Permission granted to share information::     Name::        Agency::     Relationship::     Contact Information:     Housing/Transportation Living arrangements for the past 2 months:  Single Family Home Source of Information:  Patient, Adult Children Patient Interpreter Needed:  None Criminal Activity/Legal Involvement Pertinent to Current Situation/Hospitalization:  No - Comment as needed Significant Relationships:  Adult Children Lives with:  Self Do you feel safe going back to the place where you live?  Yes Need for family participation in patient care:  Yes (Comment)  Care giving concerns:  Patient lives alone.   Social Worker assessment / plan:  Patient's daughter, Garrett Bradley and son, Garrett Bradley were at bedside. They advised that patient lives alone and at baseline ambulates unassisted and completes ADLs unassisted. The advised that patient and family are interested in SNF due to patient's weakness since the onset of his recent ilness. They stated that [facility] in [facility], [facility] or [facility].  CSW advised that patient may discharge today.     Employment status:    Insurance informationAdvertising account executive:  Managed Medicare PT Recommendations:  Skilled Nursing Facility Information / Referral to community resources:  Skilled Nursing Facility  Patient/Family's Response to care:  Patient and family are agreeable to go to SNF.   Patient/Family's Understanding of and Emotional Response to Diagnosis, Current Treatment, and Prognosis: Patient and family are understanding of patient's diagnosis, treatment and prognosis.   Emotional Assessment Appearance:  Developmentally appropriate Attitude/Demeanor/Rapport:   (Patient was  cooperative but minimally verbal.) Affect (typically observed):  Calm, Accepting Orientation:  Oriented to Self, Oriented to Place, Oriented to Situation Alcohol / Substance use:  Not Applicable Psych involvement (Current and /or in the community):  No (Comment)  Discharge Needs  Concerns to be addressed:  Discharge Planning Concerns Readmission within the last 30 days:  No Current discharge risk:  Lives alone Barriers to Discharge:  No Barriers Identified   Annice NeedySettle, Garrett Macken D, LCSW 03/23/2016, 1:34 PM

## 2016-03-23 NOTE — Progress Notes (Signed)
Physical Therapy Treatment Patient Details Name: Garrett Bradley MRN: [id number] DOB: 12/29/1933 Today's Date: 03/23/2016    History of Present Illness Garrett Bradley is a 82 y.o. male who is brought into the ER by his family due to constipation and weakness. Patient was seen in the ER earlier this week for constipation. He was treated with an enema and did have a bowel movement prior to discharge. He reports continued constipation, decreased by mouth intake. He says he becomes full very quickly after trying to eat. Denies any vomiting. No fever, cough or shortness of breath. He has chronic bladder outlet obstruction and self catheterizes at home. Family reports that he has not been eating well for over 3 weeks now. Patient does admit to feeling more depressed. He would normally enjoy watching NASCAR, but has not watched any races and quite some time. He reports losing his wife in September 2016. Since that time he feels that things have gone downhill. He has not contacted his physicians regarding any symptoms of depression. Denies any suicidal or homicidal ideations.    PT Comments    Pt received in bed, and was marginally agreeable to PT tx.  Pt required increased encouragement, and education on importance of PT, and mobility.  Pt with flat affect throughout PT session.  Pt able to perform supine<>sit with supervision and HOB raised, and pt able to complete seated therapeutic exercises.  Pt not willing to attempt standing again today due to not feeling well.  When asked what he is feeling, he states "nothing." Vitals stable throughout today's session and were as follows: Resting BP supine: 154/76, HR: 55bpm, and SpO2: 98% on 1.5L, Sitting: BP: 144/84, HR: 57bpm, and SpO2: 98%, and BP at end of session in bed with HOB raised: 153/81, HR: 63bpm, and SpO2: 98%.  At this point, pt is demonstrating increased needs for assistance to complete ADL's including transfers, and functional mobility, therefore, he is  no longer safe to d/c home alone, but would benefit from further rehab at SNF level to promote balance, strength, and endurance to return back home.   Follow Up Recommendations  SNF (Pt with limited assistance at d/c, therefore SNF recommended. )     Equipment Recommendations  Other (comment) (TBD at next facility. )    Recommendations for Other Services       Precautions / Restrictions Restrictions Weight Bearing Restrictions: No    Mobility  Bed Mobility Overal bed mobility: Needs Assistance Bed Mobility: Supine to Sit     Supine to sit: HOB elevated;Supervision     General bed mobility comments: Pt required increased time, encouragement, and use of bed rails to perform supine<>sit.    Transfers Overall transfer level:  (Pt refused sit<>stand transfer today, stating that he "just can't.")                  Ambulation/Gait                 Stairs            Wheelchair Mobility    Modified Rankin (Stroke Patients Only)       Balance   Sitting-balance support: No upper extremity supported Sitting balance-Leahy Scale: Good                              Cognition Arousal/Alertness: Awake/alert Behavior During Therapy: Flat affect (Pt requires increased encouragement and education on purpose/importance of PT. )  Overall Cognitive Status: Within Functional Limits for tasks assessed                      Exercises General Exercises - Upper Extremity Shoulder Flexion: AAROM;Left;10 reps;Limitations;Right Shoulder Flexion Limitations: B shoulders are limited due to weakness, however R is also limited due to pain.  R UE with notable swelling and redness in hand/wrist area. XR (-).  Encouraged pt on mobilizing fingers to work fluid out of the R hand.  Elbow Flexion: AAROM;Right;Left;10 reps;Limitations Elbow Flexion Limitations: Pt is fearful due to pain in R hand/fingers.  General Exercises - Lower Extremity Long Arc Quad:  Strengthening;Both;10 reps Hip ABduction/ADduction: Strengthening;Both;10 reps;Other (comment) (with pillow squeeze. ) Hip Flexion/Marching: Strengthening;Both;10 reps Heel Raises: Strengthening;10 reps;Both    General Comments        Pertinent Vitals/Pain Pain Assessment: Faces Faces Pain Scale: Hurts little more Pain Location: R UE Pain Descriptors / Indicators: Tightness Pain Intervention(s): Limited activity within patient's tolerance;Repositioned    Home Living                      Prior Function            PT Goals (current goals can now be found in the care plan section) Acute Rehab PT Goals Patient Stated Goal: Pt wants to feel better.  PT Goal Formulation: With patient/family Time For Goal Achievement: 04/03/16 Potential to Achieve Goals: Fair Progress towards PT goals: Progressing toward goals    Frequency  Min 4X/week    PT Plan Frequency needs to be updated    Co-evaluation             End of Session   Activity Tolerance: Patient tolerated treatment well Patient left: in bed;Other (comment) (HOB raised as far as possible to what pt can tolerate. )     Time: 1610-9604 PT Time Calculation (min) (ACUTE ONLY): 35 min  Charges:  $Therapeutic Exercise: 8-22 mins $Therapeutic Activity: 8-22 mins                    G Codes:      Beth Roselee Tayloe, PT, DPT X: [id number]   03/23/2016, 12:30 PM

## 2016-03-23 NOTE — Clinical Social Work Placement (Signed)
   CLINICAL SOCIAL WORK PLACEMENT  NOTE  Date:  03/23/2016  Patient Details  Name: Garrett PanderJames C Longman MRN: [id number] Date of Birth: 03/17/1934  Clinical Social Work is seeking post-discharge placement for this patient at the Skilled  Nursing Facility level of care (*CSW will initial, date and re-position this form in  chart as items are completed):  Yes   Patient/family provided with [facility] Clinical Social Work Department's list of facilities offering this level of care within the geographic area requested by the patient (or if unable, by the patient's family).  Yes   Patient/family informed of their freedom to choose among providers that offer the needed level of care, that participate in Medicare, Medicaid or managed care program needed by the patient, have an available bed and are willing to accept the patient.  Yes   Patient/family informed of [facility]'s ownership interest in [facility] and [facility], as well as of the fact that they are under no obligation to receive care at these facilities.  PASRR submitted to EDS on 03/23/16     PASRR number received on 03/23/16     Existing PASRR number confirmed on       FL2 transmitted to all facilities in geographic area requested by pt/family on 03/23/16     FL2 transmitted to all facilities within larger geographic area on       Patient informed that his/her managed care company has contracts with or will negotiate with certain facilities, including the following:            Patient/family informed of bed offers received.  Patient chooses bed at       Physician recommends and patient chooses bed at      Patient to be transferred to   on  .  Patient to be transferred to facility by       Patient family notified on   of transfer.  Name of family member notified:        PHYSICIAN       Additional Comment:    _______________________________________________ Annice NeedySettle, Albie Bazin D, LCSW 03/23/2016, 1:29 PM

## 2016-03-23 NOTE — NC FL2 (Signed)
[place] MEDICAID FL2 LEVEL OF CARE SCREENING TOOL     IDENTIFICATION  Patient Name: Garrett Bradley Birthdate: 09/25/1934 Sex: male Admission Date (Current Location): 03/16/2016  [facility] [place]   Facility and Address:  [facility],  [address], [place] [id number]      Provider Number: [id number]  Attending Physician Name and Address:  Standley Brookinganiel P Goodrich, MD  Relative Name and Phone Number:       Current Level of Care: Hospital Recommended Level of Care: Skilled Nursing Facility Prior Approval Number:    Date Approved/Denied:   PASRR Number:  [id number])  Discharge Plan: SNF    Current Diagnoses: Patient Active Problem List   Diagnosis Date Noted  . AKI (acute kidney injury) (HCC) 03/20/2016  . Orthostatic hypotension 03/20/2016  . Protein-calorie malnutrition, severe 03/17/2016  . Dehydration 03/16/2016  . Hypernatremia 03/16/2016  . Diabetes mellitus type 2 with complications (HCC) 03/16/2016  . Depression 03/16/2016  . Constipation 03/16/2016  . Failure to thrive in adult 07/27/2014  . Thrombocytopenia, unspecified (HCC) 07/27/2014  . UTI (lower urinary tract infection) 06/24/2014  . CKD (chronic kidney disease) stage 3, GFR 30-59 ml/min 08/06/2013  . Chronic indwelling Foley catheter 08/06/2013  . Hypertension 10/02/2011  . S/P TURP 09/24/2011  . Hypothyroidism 09/24/2011  . Anemia 09/24/2011  . BPH (benign prostatic hyperplasia) 09/24/2011    Orientation RESPIRATION BLADDER Height & Weight     Self, Time, Situation, Place  Normal Continent (Patient self catherizes.) Weight: 286 lb 9.6 oz (130 kg) Height:  6\' 4"  (193 cm)  BEHAVIORAL SYMPTOMS/MOOD NEUROLOGICAL BOWEL NUTRITION STATUS      Continent  (Regular. Patient states that he is having difficulty chewing his food. )  AMBULATORY STATUS COMMUNICATION OF NEEDS Skin   Extensive Assist Verbally Normal                       Personal Care Assistance  Level of Assistance  Bathing, Dressing Bathing Assistance: Limited assistance   Dressing Assistance: Limited assistance     Functional Limitations Info  Sight, Hearing, Speech Sight Info: Adequate Hearing Info: Adequate Speech Info: Adequate    SPECIAL CARE FACTORS FREQUENCY  PT (By licensed PT)     PT Frequency:  (5x/week)              Contractures      Additional Factors Info  Psychotropic, Allergies, Insulin Sliding Scale, Code Status Code Status Info:  (Full Code) Allergies Info:  (Thiazide-type Diuretics) Psychotropic Info:  (Celexa, Xanax) Insulin Sliding Scale Info:  (3x daily with meals)       Current Medications (03/23/2016):  This is the current hospital active medication list Current Facility-Administered Medications  Medication Dose Route Frequency Provider Last Rate Last Dose  . acetaminophen (TYLENOL) tablet 650 mg  650 mg Oral Q6H PRN Erick BlinksJehanzeb Memon, MD   650 mg at 03/22/16 1255   Or  . acetaminophen (TYLENOL) suppository 650 mg  650 mg Rectal Q6H PRN Erick BlinksJehanzeb Memon, MD      . ALPRAZolam Prudy Feeler(XANAX) tablet 1 mg  1 mg Oral QHS PRN Erick BlinksJehanzeb Memon, MD      . cefUROXime (CEFTIN) tablet 500 mg  500 mg Oral BID WC Standley Brookinganiel P Goodrich, MD   500 mg at 03/23/16 0905  . citalopram (CELEXA) tablet 20 mg  20 mg Oral Daily Erick BlinksJehanzeb Memon, MD   20 mg at 03/23/16 0903  . dextrose 5 %-0.45 % sodium chloride  infusion   Intravenous Continuous Nishant Dhungel, MD 75 mL/hr at 03/23/16 0249    . feeding supplement (GLUCERNA SHAKE) (GLUCERNA SHAKE) liquid 237 mL  237 mL Oral BID BM Erick Blinks, MD   237 mL at 03/22/16 1237  . ferrous sulfate tablet 325 mg  325 mg Oral Daily Erick Blinks, MD   325 mg at 03/21/16 0808  . insulin aspart (novoLOG) injection 0-5 Units  0-5 Units Subcutaneous QHS Erick Blinks, MD   2 Units at 03/19/16 2123  . insulin aspart (novoLOG) injection 0-9 Units  0-9 Units Subcutaneous TID WC Erick Blinks, MD   2 Units at 03/23/16 1217  . insulin detemir  (LEVEMIR) injection 10 Units  10 Units Subcutaneous QHS Erick Blinks, MD   10 Units at 03/22/16 2224  . latanoprost (XALATAN) 0.005 % ophthalmic solution 1 drop  1 drop Both Eyes QHS Erick Blinks, MD   1 drop at 03/22/16 2224  . levothyroxine (SYNTHROID, LEVOTHROID) tablet 137 mcg  137 mcg Oral QAC breakfast Erick Blinks, MD   137 mcg at 03/21/16 6962  . magnesium citrate solution 1 Bottle  1 Bottle Oral Once Standley Brooking, MD   1 Bottle at 03/21/16 1315  . megestrol (MEGACE) 400 MG/10ML suspension 400 mg  400 mg Oral Daily Erick Blinks, MD   400 mg at 03/21/16 0806  . ondansetron (ZOFRAN) tablet 4 mg  4 mg Oral Q6H PRN Erick Blinks, MD       Or  . ondansetron (ZOFRAN) injection 4 mg  4 mg Intravenous Q6H PRN Erick Blinks, MD   4 mg at 03/18/16 1705  . polyethylene glycol (MIRALAX / GLYCOLAX) packet 17 g  17 g Oral BID Erick Blinks, MD   17 g at 03/23/16 0902  . predniSONE (DELTASONE) tablet 40 mg  40 mg Oral Q breakfast Standley Brooking, MD   40 mg at 03/23/16 1217  . senna (SENOKOT) tablet 8.6 mg  1 tablet Oral Daily Erick Blinks, MD   8.6 mg at 03/21/16 0807  . vitamin B-12 (CYANOCOBALAMIN) tablet 2,000 mcg  2,000 mcg Oral Daily Erick Blinks, MD   2,000 mcg at 03/21/16 0100     Discharge Medications: Please see discharge summary for a list of discharge medications.  Relevant Imaging Results:  Relevant Lab Results:   Additional Information    Presley Gora, Juleen China, LCSW

## 2016-03-23 NOTE — Plan of Care (Signed)
Problem: Bowel/Gastric: Goal: Will not experience complications related to bowel motility Pt having trouble with bowel movement. Miralax given to pt. RN will continue to monitor.

## 2016-03-23 NOTE — Progress Notes (Signed)
Charlynne PanderJames C Kinley  MRN: [id number]  DOB/AGE: 80/02/1934 80 y.o.  Primary Care Physician:[name], Chancy HurterJOHN [name], MD  Admit date: 03/16/2016  Chief Complaint:  Chief Complaint  Patient presents with  . Constipation    S-Pt presented on  03/16/2016 with  Chief Complaint  Patient presents with  . Constipation  .    Pt main concern is " I feel weak" But pt is not able to quantify any further.   Meds  . cefUROXime  500 mg Oral BID WC  . citalopram  20 mg Oral Daily  . feeding supplement (GLUCERNA SHAKE)  237 mL Oral BID BM  . ferrous sulfate  325 mg Oral Daily  . insulin aspart  0-5 Units Subcutaneous QHS  . insulin aspart  0-9 Units Subcutaneous TID WC  . insulin detemir  10 Units Subcutaneous QHS  . latanoprost  1 drop Both Eyes QHS  . levothyroxine  137 mcg Oral QAC breakfast  . magnesium citrate  1 Bottle Oral Once  . megestrol  400 mg Oral Daily  . polyethylene glycol  17 g Oral BID  . predniSONE  40 mg Oral Q breakfast  . senna  1 tablet Oral Daily  . cyanocobalamin  2,000 mcg Oral Daily     Physical Exam: Vital signs in last 24 hours: Temp:  [98 F (36.7 C)-98.3 F (36.8 C)] 98 F (36.7 C) (04/17 0615) Pulse Rate:  [55-58] 58 (04/17 0615) Resp:  [16] 16 (04/17 0615) BP: (147-177)/(75-89) 177/89 mmHg (04/17 0615) SpO2:  [98 %-99 %] 99 % (04/17 1323) Weight change:  Last BM Date: 03/21/16  Intake/Output from previous day: 04/16 0701 - 04/17 0700 In: 607.5 [P.O.:240; I.V.:367.5] Out: 500 [Urine:500] Total I/O In: 1551.3 [I.V.:1551.3] Out: -    Physical Exam: General- pt is awake,alert, oriented to time place and person Resp- No acute REsp distress, decreased Bs at base. CVS- S1S2 regular in rate and rhythm GIT- BS+, soft, NT, ND EXT- NO LE Edema, NO Cyanosis   Lab Results: CBC  Recent Labs  03/21/16 0553  WBC 8.3  HGB 11.5*  HCT 36.3*  PLT 103*    BMET  Recent Labs  03/22/16 0543 03/23/16 0607  NA 149* 148*  K 4.4 3.9  CL 113* 113*   CO2 28 28  GLUCOSE 160* 206*  BUN 35* 31*  CREATININE 1.57* 1.33*  CALCIUM 9.0 8.7*    Creat 2017 2.3--2.4=>1.8=>1.57=>1.33 2016 1.8 2015 1.3--2.4 2014 1.1--1.7 2012 1.3--3.2 ( AKI ) 2010 1.1--1.7 ( AKI )   Sodium 2017  160=>148    MICRO Recent Results (from the past 240 hour(s))  Culture, Urine     Status: Abnormal   Collection Time: 03/19/16  6:35 AM  Result Value Ref Range Status   Specimen Description URINE, CATHETERIZED  Final   Special Requests NONE  Final   Culture >=100,000 COLONIES/mL PROTEUS MIRABILIS (A)  Final   Report Status 03/22/2016 FINAL  Final   Organism ID, Bacteria PROTEUS MIRABILIS (A)  Final      Susceptibility   Proteus mirabilis - MIC*    AMPICILLIN <=2 SENSITIVE Sensitive     CEFAZOLIN <=4 SENSITIVE Sensitive     CEFTRIAXONE <=1 SENSITIVE Sensitive     CIPROFLOXACIN <=0.25 SENSITIVE Sensitive     GENTAMICIN <=1 SENSITIVE Sensitive     IMIPENEM <=0.25 SENSITIVE Sensitive     NITROFURANTOIN 64 RESISTANT Resistant     TRIMETH/SULFA <=20 SENSITIVE Sensitive     AMPICILLIN/SULBACTAM <=2 SENSITIVE Sensitive  PIP/TAZO <=4 SENSITIVE Sensitive     * >=100,000 COLONIES/mL PROTEUS MIRABILIS      Lab Results  Component Value Date   CALCIUM 8.7* 03/23/2016   CAION 1.17 10/19/2011   PHOS 2.7 03/22/2016               Impression: 1)Renal AKI secondary to Prerena/ATN/Post renal  AKI sec to Hypovolemia  AKI sec to post renal issuies  AKI on CKD  CKD stage 3.  CKD since 2010  CKD secondary to   Low glomerular mass- horse shoe kidney  Nephrolithiasis  Age associated decline    Progression of CKD marked with multiple AKI                  AKI now much better                Creat back to baseline.    2)HTN  BP at goal  3)Anemia HGb at goal  (9--11)   4)GI- admitted with constipation Primary team following   5)Electrolytes  Normokalemic  Hypernatremic   On D5 1/2 NS  6)Acid base Co2 at goal    Plan:  Will continue current care      BHUTANI,MANPREET S 03/23/2016, 3:11 PM

## 2016-03-23 NOTE — Clinical Social Work Placement (Signed)
   CLINICAL SOCIAL WORK PLACEMENT  NOTE  Date:  03/23/2016  Patient Details  Name: Charlynne PanderJames C Janeway MRN: [id number] Date of Birth: 07/11/1934  Clinical Social Work is seeking post-discharge placement for this patient at the Skilled  Nursing Facility level of care (*CSW will initial, date and re-position this form in  chart as items are completed):  Yes   Patient/family provided with [facility] Clinical Social Work Department's list of facilities offering this level of care within the geographic area requested by the patient (or if unable, by the patient's family).  Yes   Patient/family informed of their freedom to choose among providers that offer the needed level of care, that participate in Medicare, Medicaid or managed care program needed by the patient, have an available bed and are willing to accept the patient.  Yes   Patient/family informed of [facility]'s ownership interest in [facility] and [facility], as well as of the fact that they are under no obligation to receive care at these facilities.  PASRR submitted to EDS on 03/23/16     PASRR number received on 03/23/16     Existing PASRR number confirmed on       FL2 transmitted to all facilities in geographic area requested by pt/family on 03/23/16     FL2 transmitted to all facilities within larger geographic area on       Patient informed that his/her managed care company has contracts with or will negotiate with certain facilities, including the following:        Yes   Patient/family informed of bed offers received.  [facility]     Physician recommends and patient chooses bed at      [facility] on  .  Patient to be transferred to facility by       Patient family notified on   of transfer.  Name of family member notified:        PHYSICIAN       Additional Comment:    _______________________________________________ Annice NeedySettle,  Jenessa Gillingham D, LCSW 03/23/2016, 2:00 PM

## 2016-03-24 ENCOUNTER — Non-Acute Institutional Stay (SKILLED_NURSING_FACILITY): Payer: Medicare Other | Admitting: Internal Medicine

## 2016-03-24 ENCOUNTER — Encounter: Payer: Self-pay | Admitting: Internal Medicine

## 2016-03-24 DIAGNOSIS — D696 Thrombocytopenia, unspecified: Secondary | ICD-10-CM | POA: Diagnosis not present

## 2016-03-24 DIAGNOSIS — N400 Enlarged prostate without lower urinary tract symptoms: Secondary | ICD-10-CM | POA: Diagnosis not present

## 2016-03-24 DIAGNOSIS — Z794 Long term (current) use of insulin: Secondary | ICD-10-CM

## 2016-03-24 DIAGNOSIS — E118 Type 2 diabetes mellitus with unspecified complications: Secondary | ICD-10-CM

## 2016-03-24 DIAGNOSIS — F329 Major depressive disorder, single episode, unspecified: Secondary | ICD-10-CM | POA: Diagnosis not present

## 2016-03-24 DIAGNOSIS — F32A Depression, unspecified: Secondary | ICD-10-CM

## 2016-03-24 DIAGNOSIS — N390 Urinary tract infection, site not specified: Secondary | ICD-10-CM

## 2016-03-24 DIAGNOSIS — M25531 Pain in right wrist: Secondary | ICD-10-CM

## 2016-03-24 DIAGNOSIS — R627 Adult failure to thrive: Secondary | ICD-10-CM | POA: Diagnosis not present

## 2016-03-24 LAB — BASIC METABOLIC PANEL: GLUCOSE: 168 mg/dL

## 2016-03-24 NOTE — Assessment & Plan Note (Signed)
Full course of antibiotic completed 03/23/16 Urology OP follow up

## 2016-03-24 NOTE — Assessment & Plan Note (Signed)
Avoid excess aspirin , ibuprofen, naproxen etc .Repeat platelet count if  any abnormal bruising or bleeding occur. 

## 2016-03-24 NOTE — Assessment & Plan Note (Addendum)
Monitor nutritional status and multiple comorbidities at [facility]  Physical Therapy Continue SSRI

## 2016-03-24 NOTE — Assessment & Plan Note (Addendum)
Wean Prednisone as quickly as possible because of diabetes; scheduled until 4/22

## 2016-03-24 NOTE — Progress Notes (Signed)
Patient ID: Garrett Bradley, male   DOB: 02/28/1934, 82 y.o.   MRN: [id number]     This is a comprehensive admission note to [facility] personally performed by Marga MelnickWilliam Hopper MD on this date less than 30 days from date of admission. Included are preadmission medical/surgical history;reconciled medication list; family history; social history and comprehensive review of systems.  Corrections and additions to the records were documented . Comprehensive physical exam was also performed. Additionally a clinical summary was entered for each active diagnosis pertinent to this admission in the Problem List to enhance continuity of care.  PCP: Dr. Assunta FoundJohn [name]  HPI: His complicated hospital course 4/10-4/17/17 was reviewed in detail. He presented as adult failure to thrive with a three-week history of constipation, weakness, anorexia, and early satiety. Hospital course was Complicated by acute on chronic renal insufficiency; Proteus urinary tract infection; acute gout right wrist; and depression  Past medical and surgical history:He has insulin-dependent diabetes; hypothyroidism; hypertension; BPH with outlet obstruction; S/P TURP  Social history: verified   Family history: updated   Comprehensive review of systems: He continues to have poor appetite. He has some dysphagia with pills as well as food.  H he notices occasional skipping of his heart. His wrist pain has improved.  Constitutional: No fever,significant weight change  Eyes: No redness, discharge, pain, vision change ENT/mouth: No nasal congestion,  purulent discharge, earache,change in hearing ,sore throat  Cardiovascular: No chest pain, paroxysmal nocturnal dyspnea, claudication, edema  Respiratory: No cough, sputum production,hemoptysis, DOE , significant snoring,apnea   Gastrointestinal: No heartburn,abdominal pain, nausea / vomiting,rectal bleeding, melena,change in bowels Genitourinary: No dysuria,hematuria, pyuria,   incontinence, nocturia (Foley in place) Dermatologic: No rash, pruritus, change in appearance of skin Neurologic: No dizziness,headache,syncope, seizures, numbness , tingling Psychiatric: No significant anxiety , depression, insomnia Endocrine: No change in hair/skin/ nails, excessive thirst, excessive hunger, excessive urination  Hematologic/lymphatic: No significant bruising, lymphadenopathy,abnormal bleeding Allergy/immunology: No itchy/ watery eyes, significant sneezing, urticaria, angioedema  Physical exam:  Pertinent or positive findings: He has a full beard and mustache. Slight ptosis is present bilaterally. He has arcus senilis. Maxillary is edentulous. The few remaining mandibular teeth are plaque coated. Foley catheter is in place. He is wearing compression stockings. Dorsalis pedis pulses are decreased. Clubbing of the nailbeds is present. The nails are dark.  General appearance:Adequately nourished; no acute distress , increased work of breathing is present.   Lymphatic: No lymphadenopathy about the head, neck, axilla . Eyes: No conjunctival inflammation or lid edema is present. There is no scleral icterus. Ears:  External ear exam shows no significant lesions or deformities.   Nose:  External nasal examination shows no deformity or inflammation. Nasal mucosa are pink and moist without lesions ,exudates Oral exam: lips and gums are healthy appearing.There is no oropharyngeal erythema or exudate . Neck:  No thyromegaly, masses, tenderness noted.    Heart:  Normal rate and regular rhythm. S1 and S2 normal without gallop, murmur, click, rub .  Lungs:Chest clear to auscultation without wheezes, rhonchi,rales , rubs. Abdomen:Bowel sounds are normal. Abdomen is soft and nontender with no organomegaly, hernias,masses. GU: deferred as previously addressed. Extremities:  No cyanosis,edema  Neurologic exam : Strength equal  in upper & lower extremities Balance,Rhomberg,finger to nose  testing could not be completed due to clinical state Deep tendon reflexes are equal but 1/2+ Skin: Warm & dry w/o tenting. No significant lesions or rash.  See clinical summary under each active problem in the Problem List with  associated updated therapeutic plan

## 2016-03-24 NOTE — Assessment & Plan Note (Addendum)
Wean prednisone as quickly as possible because of diabetes A1c

## 2016-03-24 NOTE — Patient Instructions (Signed)
See summary under each active problem in the Problem List with associated updated therapeutic plan. Completed document placed in chart @ [facility].

## 2016-03-24 NOTE — Assessment & Plan Note (Signed)
[facility] Continue antidepressant

## 2016-03-24 NOTE — Assessment & Plan Note (Signed)
Follow-up with urology will be scheduled

## 2016-03-27 ENCOUNTER — Non-Acute Institutional Stay: Payer: Medicare Other | Admitting: Internal Medicine

## 2016-03-27 ENCOUNTER — Encounter: Payer: Self-pay | Admitting: Internal Medicine

## 2016-03-27 DIAGNOSIS — E118 Type 2 diabetes mellitus with unspecified complications: Secondary | ICD-10-CM | POA: Diagnosis not present

## 2016-03-27 DIAGNOSIS — R627 Adult failure to thrive: Secondary | ICD-10-CM | POA: Diagnosis not present

## 2016-03-27 NOTE — Progress Notes (Signed)
Patient ID: Garrett Bradley, male   DOB: 20-Apr-1934, 82 y.o.   MRN: [id number]  Location:  [facility]   Place of Service:  SNF (31)    Phillips Odor, [name], MD  Patient Care Team: Assunta Found, MD as PCP - General [facility])  Extended Emergency Contact Information Primary Emergency Contact: [place], [place] [address] [place] Home Phone: [phone] Mobile Phone: [phone] Relation: Son  Goals of care: Advanced Directive information Advanced Directives 03/27/2016  Does patient have an advance directive? Yes  Type of Advance Directive Living will  Does patient want to make changes to advanced directive? No - Patient declined  Copy of advanced directive(s) in chart? Yes     Chief Complaint  Patient presents with  . Acute Visit  Secondary to elevated CBGs-follow-up failure to thrive  HPI:  Pt is a 82 y.o. male seen today for an acute visit for elevated CBGs-he  does have history of type 2 diabetes And is on Levemir 15 units daily at bedtime-he is also completing a course of prednisone for gout flare which has improved-I suspect this is elevating his CBGs somewhat as well blood sugars in the morning appear to run mid 100s to mid 200s-however later in the day often jumps to the 300 range.  Last couple days a.m. readings have been well and to the 200s.  In regards of failure to thrive he is on nutritional supplements and actually has gained about 3 pounds during his stay here which is encouraging-he does continue on Megace for appetite stimulation.  He continues on Celexa 20 mg a day for depression  Currently he appears to be stable somewhat soft-spoken but appears to be speaking more more interactive which is encouraging he does have some mild edema of his right wrist and hand. He says this is feeling significantly better status post treatment for gout   Past Medical History  Diagnosis Date  . Hypertension   . Diabetes  mellitus   . Hypothyroidism   . Depression   . Enlarged prostate     Status post TURP; self caths @ home   Past Surgical History  Procedure Laterality Date  . Hernia repair    . Transurethral resection of prostate  09/22/2011    Procedure: TRANSURETHRAL RESECTION OF THE PROSTATE (TURP);  Surgeon: Ky Barban;  Location: [facility];  Service: Urology;  Laterality: N/A;    Allergies  Allergen Reactions  . Thiazide-Type Diuretics     Patient was just told not to take, not sure if an actual allergy   Medications.  Glucerna shake twice a day.  Xanax 1 mg daily at bedtime when necessary.  Vitamin B12 2000 g daily.  Diltiazem 300 mg daily.  Ferrous sulfate 325 mg every morning.  Fish oil thousand milligrams twice a day.  Levemir 15 units daily at bedtime.  Synthroid 137 g daily.  Megace 400 mg daily.  MiraLAX 17 g in 8 ounces fluid twice a day.  Omega-3 fatty acids Fish oil [phone] milligrams twice a day.  Potassium chloride 20 mEq twice a day.  Senokot daily.  Temovate ointment 0.05% to legs when necessary once a day.  Travel post eyedrops 0.004% 1 drop both eyes daily at bedtime.  Celexa 20 mg daily.  Prednisone 40 mg a day last dose 03/28/2016   Review of Systems   In general does not complain of fever or chills says left wrist  pain is significantly improved.--Thinks therapy is working him a bit hard  In general does not complaining of fever chills.  Resp- no complaint shortness breath or cough.  Cardiac no chest pain  . GI is not complaining currently of any abdominal discomfort or dysphagia no nausea or vomiting  Muscle skeletal right wrist pain has improved. Not complaining of acute joint pain otherwise-however feels therapy is somewhat intense  Neurologic is not complaining of dizziness or headache.  Psych-does have a history of depression appears to be speaking a bit more continues on Celexa does not complain of overt  depression     There is no immunization history on file for this patient. Pertinent  Health Maintenance Due  Topic Date Due  . FOOT EXAM  12/09/1945  . OPHTHALMOLOGY EXAM  12/09/1945  . URINE MICROALBUMIN  12/09/1945  . PNA vac Low Risk Adult (1 of 2 - PCV13) 12/09/2000  . HEMOGLOBIN A1C  04/15/2016  . INFLUENZA VACCINE  07/07/2016   No flowsheet data found. Functional Status Survey:    Filed Vitals:   03/27/16 1318  BP: 132/74  Pulse: 76  Temp: 98.1 F (36.7 C)  TempSrc: Oral  Resp: 18  Height:  (1.93 m)  Weight: 253 lb (114.76 kg)   Body mass index is 30.81 kg/(m^2). Physical Exam   In general this is a pleasant frail elderly male in no distress.  He does have somewhat of a flat affect  Skin is warm and dry.  Eyes does have slight proptosis bilaterally visual acuity appears grossly intact.  Chest clear to auscultation there is no labored breathing.  Heart is regular rate and rhythm without murmur gallop or rub.  She does abdomen soft nontender positive bowel sounds.  It is somewhat obese.  Muscle skeletal is ambulatory and wheelchair he does have some edema to his right wrist and hand apparently this has improved it is less tender less swollen.  Neurologic appears grossly intact no lateralizing findings his speech is clear.  Psych he is alert nor ended soft-spoken somewhat of a flat affect but continues to be pleasant and appropriate  Labs reviewed:  Recent Labs  03/20/16 0527 03/21/16 0553 03/22/16 0543 03/23/16 0607  NA 148* 148* 149* 148*  K 3.5 4.2 4.4 3.9  CL 112* 113* 113* 113*  CO2 GLUCOSE 194* 192* 160* 206*  BUN 46* 43* 35* 31*  CREATININE 2.01* 1.80* 1.57* 1.33*  CALCIUM 8.7* 8.6* 9.0 8.7*  PHOS 3.1  --  2.7  --     Recent Labs  02/24/16 1204 03/11/16 1854 03/16/16 1349 03/20/16 0527 03/22/16 0543  AST 21 37 44*  --   --   ALT 26 56 42  --   --   ALKPHOS 56 57 53  --   --   BILITOT 0.9 1.1 1.0  --   --    PROT 7.7 8.5* 8.4*  --   --   ALBUMIN 3.3* 3.6 3.5 2.8* 2.4*    Recent Labs  02/24/16 1204 03/11/16 1854 03/16/16 1349  03/19/16 0633 03/20/16 0527 03/21/16 0553  WBC 6.0 7.6 9.5  < > 6.6 7.0 8.3  NEUTROABS 4.4 5.6 7.5  --   --   --   --   HGB 11.7* 13.2 13.5  < > 10.9* 11.2* 11.5*  HCT 35.7* 41.4 42.7  < > 34.3* 35.4* 36.3*  MCV 88.8 91.2 92.4  < > 90.7 89.8 89.6  PLT 160 165  141*  < > 102* 95* 103*  < > = values in this interval not displayed. Lab Results  Component Value Date   TSH 2.658 03/16/2016   Lab Results  Component Value Date   HGBA1C 8.1* 10/17/2015   No results found for: CHOL, HDL, LDLCALC, LDLDIRECT, TRIG, CHOLHDL  Significant Diagnostic Results in last 30 days:  Ct Abdomen Pelvis Wo Contrast  03/11/2016  CLINICAL DATA:  Abdominal pain.  No bowel movements for 4 days. EXAM: CT ABDOMEN AND PELVIS WITHOUT CONTRAST TECHNIQUE: Multidetector CT imaging of the abdomen and pelvis was performed following the standard protocol without IV contrast. COMPARISON:  07/09/2014 FINDINGS: No evidence of bowel obstruction. Oral contrast has progressed through to the ascending colon. Small bowel and stomach are unremarkable. There is a mildly generous volume of colonic stool, without evidence of colonic obstruction. There are unremarkable unenhanced appearances of the liver. There are multiple calculi within the gallbladder lumen, measuring approximately 7 mm. No bile duct dilatation. The spleen is unremarkable. There probably is a 1.4 cm cystic lesion of the pancreatic tail and this is not significantly changed from 07/09/2014 for 09/16/2011. The pancreas is otherwise unremarkable. The adrenals are unremarkable. There is a horseshoe kidney. There are multiple collecting system calculi. The largest right sided calculus measures 5 mm. The largest left-sided calculus measures 1.5 cm, enlarged from 2015 when it measured 8 mm. There are no ureteral calculi. The urinary bladder is decompressed  and cannot be evaluated. The abdominal aorta is normal in caliber with mild atherosclerotic calcification. No significant abnormality is evident in the lower chest. No significant musculoskeletal lesion is evident. Moderately severe degenerative lumbar facet disease is present from L4 through S1. IMPRESSION: 1. No acute findings are evident in the abdomen or pelvis. 2. There is a generous volume of colonic stool, without evidence of obstruction. 3. Cholelithiasis. 4. Nephrolithiasis.  Horseshoe kidney. 5. Small cystic lesion of the pancreatic tail, unchanged from 2012. Electronically Signed   By: Ellery Plunk M.D.   On: 03/11/2016 21:14   Dg Wrist Complete Right  03/22/2016  CLINICAL DATA:  82 year old male with pain and swelling in the right wrist for the past several days. No history of injury. EXAM: RIGHT WRIST - COMPLETE 3+ VIEW COMPARISON:  None. FINDINGS: Multiple views of the right wrist demonstrate no acute displaced fracture, subluxation, dislocation, or soft tissue abnormality. IMPRESSION: No acute radiographic abnormality of the right wrist. Electronically Signed   By: Trudie Reed M.D.   On: 03/22/2016 14:47   US Renal  03/18/2016  CLINICAL DATA:  Acute kidney insufficiency, horseshoe kidney EXAM: RENAL / URINARY TRACT ULTRASOUND COMPLETE COMPARISON:  CT scan 03/11/2016 FINDINGS: Kidneys: Again noted horseshoe kidney deformity. The visualized upper pole of the right kidney measures about 7.2 cm in length. Visualized upper pole and midpole of the left kidney measures 11.3 cm in length. No hydronephrosis. There is a calcified calculus in lower pole region of the left kidney measures 1.1 cm. There is a cyst in midpole of the left kidney measures 1.1 cm. Mild increased renal echogenicity probable due to medical renal disease. Bladder: The urinary bladder is collapsed with Foley catheter. IMPRESSION: Again noted horseshoe kidney deformity. The visualized upper pole of the right kidney measures  about 7.2 cm in length. Visualized upper pole and midpole of the left kidney measures 11.3 cm in length. No hydronephrosis. There is a calcified calculus in lower pole region of the left kidney measures 1.1 cm. There is a cyst in  midpole of the left kidney measures 1.1 cm. Mild increased renal echogenicity probable due to medical renal disease. Foley catheter within decompressed urinary bladder. Electronically Signed   By: Natasha MeadLiviu  Pop M.D.   On: 03/18/2016 10:26   [place] Venous Img Upper Uni Right  03/20/2016  CLINICAL DATA:  82 year old male with a history of right upper extremity edema. EXAM: RIGHT UPPER EXTREMITY VENOUS DOPPLER ULTRASOUND TECHNIQUE: Gray-scale sonography with graded compression, as well as color Doppler and duplex ultrasound were performed to evaluate the upper extremity deep venous system from the level of the subclavian vein and including the jugular, axillary, basilic, radial, ulnar and upper cephalic vein. Spectral Doppler was utilized to evaluate flow at rest and with distal augmentation maneuvers. COMPARISON:  None. FINDINGS: Contralateral Subclavian Vein: Respiratory phasicity is normal and symmetric with the symptomatic side. No evidence of thrombus. Normal compressibility. Internal Jugular Vein: No evidence of thrombus. Normal compressibility, respiratory phasicity and response to augmentation. Subclavian Vein: No evidence of thrombus. Normal compressibility, respiratory phasicity and response to augmentation. Axillary Vein: No evidence of thrombus. Normal compressibility, respiratory phasicity and response to augmentation. Cephalic Vein: No evidence of thrombus. Normal compressibility, respiratory phasicity and response to augmentation. Basilic Vein: No evidence of thrombus. Normal compressibility, respiratory phasicity and response to augmentation. Brachial Veins: No evidence of thrombus. Normal compressibility, respiratory phasicity and response to augmentation. Radial Veins: No  evidence of thrombus. Normal compressibility, respiratory phasicity and response to augmentation. Ulnar Veins: No evidence of thrombus. Normal compressibility, respiratory phasicity and response to augmentation. Other Findings:  None visualized. IMPRESSION: Sonographic survey of the right upper extremity negative for DVT. Signed, Yvone NeuJaime S. Loreta AveWagner, DO [facility] Electronically Signed   By: Gilmer MorJaime  Wagner D.O.   On: 03/20/2016 11:29   Dg Abd Acute W/chest  03/16/2016  CLINICAL DATA:  Constipation for 3 weeks. EXAM: DG ABDOMEN ACUTE W/ 1V CHEST COMPARISON:  Chest radiographs 03/14/2015. CT abdomen and pelvis 03/11/2016 FINDINGS: The cardiomediastinal silhouette is within normal limits. Slight chronic elevation of the right hemidiaphragm is noted. The lungs are well inflated without evidence of airspace consolidation, edema, pleural effusion, or pneumothorax. No intraperitoneal free air is identified. Residual oral contrast material is present in the proximal colon. A small to at most moderate amount of stool is present in the ascending, transverse, and proximal descending colon. Gas is present in predominantly decompressed distal colon. No dilated loops of bowel suggestive of obstruction are identified. Lumbar spondylosis and renal calculi are noted as demonstrated on recent CT. IMPRESSION: 1. No evidence of acute cardiopulmonary process. 2. Nonobstructed bowel gas pattern. Residual contrast in the proximal colon. Electronically Signed   By: Sebastian AcheAllen  Grady M.D.   On: 03/16/2016 13:30    Assessment/Plan   #1-diabetes-blood sugars appear to be somewhat elevated I suspect this is aggravated somewhat by the prednisone-will increase his Levemir up to 17 units daily at bedtime-also will start Humalog insulin routine for any blood sugar above 300 to give 5 units before meals meals.  At this point no at bedtime coverage but call if greater than 300.  I  suspect we will have to titrate this accordingly but would like to see what he runs when he is off the prednisone.  #2 failure to thrive he continues on Megace and Celexa-he has slowly gaining some weight here which is encouraging supplements will have to be encouraged.  Renal function appears to be improved withr recent creatinine of 1.33 which appears to be trending  down.  Will update a BMP next week.  [id number]

## 2016-03-28 ENCOUNTER — Encounter (HOSPITAL_COMMUNITY)
Admission: RE | Admit: 2016-03-28 | Discharge: 2016-03-28 | Disposition: A | Discharge status: Home / Self Care | Payer: Medicare Other | Source: Skilled Nursing Facility | Attending: Internal Medicine | Admitting: Internal Medicine

## 2016-03-28 DIAGNOSIS — K5900 Constipation, unspecified: Secondary | ICD-10-CM | POA: Insufficient documentation

## 2016-03-28 DIAGNOSIS — F329 Major depressive disorder, single episode, unspecified: Secondary | ICD-10-CM | POA: Insufficient documentation

## 2016-03-28 DIAGNOSIS — E1165 Type 2 diabetes mellitus with hyperglycemia: Secondary | ICD-10-CM | POA: Insufficient documentation

## 2016-03-28 DIAGNOSIS — A300 Indeterminate leprosy: Secondary | ICD-10-CM | POA: Insufficient documentation

## 2016-03-28 DIAGNOSIS — M6281 Muscle weakness (generalized): Secondary | ICD-10-CM | POA: Insufficient documentation

## 2016-03-28 DIAGNOSIS — I10 Essential (primary) hypertension: Secondary | ICD-10-CM | POA: Insufficient documentation

## 2016-03-28 LAB — BASIC METABOLIC PANEL
Anion gap: 8 (ref 5–15)
BUN: 52 mg/dL — AB (ref 6–20)
BUN: 52 mg/dL — AB (ref 4–21)
CALCIUM: 8.7 mg/dL — AB (ref 8.9–10.3)
CHLORIDE: 112 mmol/L — AB (ref 101–111)
CO2: 26 mmol/L (ref 22–32)
CREATININE: 1.59 mg/dL — AB (ref 0.61–1.24)
Creatinine: 1.6 mg/dL — AB (ref 0.6–1.3)
GFR, EST AFRICAN AMERICAN: 46 mL/min — AB (ref >60)
GFR, EST NON AFRICAN AMERICAN: 39 mL/min — AB (ref >60)
GLUCOSE: 242 mg/dL
Glucose, Bld: 242 mg/dL — ABNORMAL HIGH (ref 65–99)
POTASSIUM: 4.5 mmol/L (ref 3.4–5.3)
Potassium: 4.5 mmol/L (ref 3.5–5.1)
SODIUM: 146 mmol/L — AB (ref 135–145)
Sodium: 146 mmol/L (ref 137–147)

## 2016-03-30 ENCOUNTER — Encounter (HOSPITAL_COMMUNITY)
Admission: RE | Admit: 2016-03-30 | Discharge: 2016-03-30 | Disposition: A | Discharge status: Home / Self Care | Payer: Medicare Other | Source: Skilled Nursing Facility | Attending: Internal Medicine | Admitting: Internal Medicine

## 2016-03-30 LAB — BASIC METABOLIC PANEL
ANION GAP: 9 (ref 5–15)
BUN: 37 mg/dL — AB (ref 6–20)
BUN: 37 mg/dL — AB (ref 4–21)
CHLORIDE: 108 mmol/L (ref 101–111)
CO2: 25 mmol/L (ref 22–32)
Calcium: 8.6 mg/dL — ABNORMAL LOW (ref 8.9–10.3)
Creatinine, Ser: 1.45 mg/dL — ABNORMAL HIGH (ref 0.61–1.24)
Creatinine: 1.4 mg/dL — AB (ref 0.6–1.3)
GFR calc Af Amer: 51 mL/min — ABNORMAL LOW (ref >60)
GFR calc non Af Amer: 44 mL/min — ABNORMAL LOW (ref >60)
GLUCOSE: 266 mg/dL — AB (ref 65–99)
Glucose: 266 mg/dL
POTASSIUM: 4.6 mmol/L (ref 3.5–5.1)
Potassium: 4.6 mmol/L (ref 3.4–5.3)
SODIUM: 142 mmol/L (ref 137–147)
Sodium: 142 mmol/L (ref 135–145)

## 2016-03-30 LAB — HEMOGLOBIN A1C
Hgb A1c MFr Bld: 7.2 % — ABNORMAL HIGH (ref 4.8–5.6)
Mean Plasma Glucose: 160 mg/dL

## 2016-03-30 LAB — CBC
HEMATOCRIT: 39.1 % (ref 39.0–52.0)
Hemoglobin: 12.8 g/dL — ABNORMAL LOW (ref 13.0–17.0)
MCH: 28.7 pg (ref 26.0–34.0)
MCHC: 32.7 g/dL (ref 30.0–36.0)
MCV: 87.7 fL (ref 78.0–100.0)
PLATELETS: 175 10*3/uL (ref 150–400)
RBC: 4.46 MIL/uL (ref 4.22–5.81)
RDW: 15.6 % — AB (ref 11.5–15.5)
WBC: 8.7 10*3/uL (ref 4.0–10.5)

## 2016-03-30 LAB — CBC AND DIFFERENTIAL: WBC: 8.7 10^3/mL

## 2016-04-01 ENCOUNTER — Encounter (HOSPITAL_COMMUNITY)
Admission: RE | Admit: 2016-04-01 | Discharge: 2016-04-01 | Disposition: A | Discharge status: Home / Self Care | Payer: Medicare Other | Source: Skilled Nursing Facility | Attending: Internal Medicine | Admitting: Internal Medicine

## 2016-04-01 DIAGNOSIS — M25531 Pain in right wrist: Secondary | ICD-10-CM

## 2016-04-01 LAB — BASIC METABOLIC PANEL
Anion gap: 7 (ref 5–15)
BUN: 33 mg/dL — AB (ref 4–21)
BUN: 33 mg/dL — AB (ref 6–20)
CALCIUM: 8.1 mg/dL — AB (ref 8.9–10.3)
CO2: 22 mmol/L (ref 22–32)
CREATININE: 1.4 mg/dL — AB (ref 0.6–1.3)
CREATININE: 1.37 mg/dL — AB (ref 0.61–1.24)
Chloride: 106 mmol/L (ref 101–111)
GFR calc Af Amer: 55 mL/min — ABNORMAL LOW (ref >60)
GFR calc non Af Amer: 47 mL/min — ABNORMAL LOW (ref >60)
GLUCOSE: 214 mg/dL
GLUCOSE: 214 mg/dL — AB (ref 65–99)
Potassium: 4.4 mmol/L (ref 3.4–5.3)
Potassium: 4.4 mmol/L (ref 3.5–5.1)
Sodium: 135 mmol/L (ref 135–145)
Sodium: 135 mmol/L — AB (ref 137–147)

## 2016-04-04 NOTE — Progress Notes (Signed)
Patient ID: Garrett PanderJames C Spellman, male   DOB: 03/28/1934, 82 y.o.   MRN: [id number]

## 2016-04-05 LAB — BASIC METABOLIC PANEL
GLUCOSE: 289 mg/dL
GLUCOSE: 336 mg/dL
Glucose: 252 mg/dL
Glucose: 332 mg/dL

## 2016-04-06 ENCOUNTER — Other Ambulatory Visit (HOSPITAL_COMMUNITY)
Admission: RE | Admit: 2016-04-06 | Discharge: 2016-04-06 | Disposition: A | Discharge status: Home / Self Care | Payer: Medicare Other | Source: Skilled Nursing Facility | Attending: Internal Medicine | Admitting: Internal Medicine

## 2016-04-06 DIAGNOSIS — A192 Acute miliary tuberculosis, unspecified: Secondary | ICD-10-CM | POA: Insufficient documentation

## 2016-04-06 DIAGNOSIS — N183 Chronic kidney disease, stage 3 (moderate): Secondary | ICD-10-CM | POA: Insufficient documentation

## 2016-04-06 DIAGNOSIS — F329 Major depressive disorder, single episode, unspecified: Secondary | ICD-10-CM | POA: Insufficient documentation

## 2016-04-06 DIAGNOSIS — I10 Essential (primary) hypertension: Secondary | ICD-10-CM | POA: Insufficient documentation

## 2016-04-06 DIAGNOSIS — E1165 Type 2 diabetes mellitus with hyperglycemia: Secondary | ICD-10-CM | POA: Insufficient documentation

## 2016-04-06 DIAGNOSIS — M6281 Muscle weakness (generalized): Secondary | ICD-10-CM | POA: Insufficient documentation

## 2016-04-06 LAB — BASIC METABOLIC PANEL
Anion gap: 8 (ref 5–15)
BUN: 20 mg/dL (ref 6–20)
CALCIUM: 8.3 mg/dL — AB (ref 8.9–10.3)
CHLORIDE: 102 mmol/L (ref 101–111)
CO2: 22 mmol/L (ref 22–32)
CREATININE: 1.28 mg/dL — AB (ref 0.61–1.24)
GFR calc non Af Amer: 51 mL/min — ABNORMAL LOW (ref >60)
GFR, EST AFRICAN AMERICAN: 59 mL/min — AB (ref >60)
GLUCOSE: 226 mg/dL
GLUCOSE: 251 mg/dL
GLUCOSE: 305 mg/dL — AB (ref 65–99)
Glucose: 297 mg/dL
Glucose: 309 mg/dL
Potassium: 4.6 mmol/L (ref 3.5–5.1)
Sodium: 132 mmol/L — ABNORMAL LOW (ref 135–145)

## 2016-04-06 LAB — BRAIN NATRIURETIC PEPTIDE: B Natriuretic Peptide: 113 pg/mL — ABNORMAL HIGH (ref 0.0–100.0)

## 2016-04-07 ENCOUNTER — Non-Acute Institutional Stay (SKILLED_NURSING_FACILITY): Payer: Medicare Other | Admitting: Internal Medicine

## 2016-04-07 ENCOUNTER — Encounter: Payer: Self-pay | Admitting: Internal Medicine

## 2016-04-07 DIAGNOSIS — F32A Depression, unspecified: Secondary | ICD-10-CM

## 2016-04-07 DIAGNOSIS — E118 Type 2 diabetes mellitus with unspecified complications: Secondary | ICD-10-CM | POA: Diagnosis not present

## 2016-04-07 DIAGNOSIS — F329 Major depressive disorder, single episode, unspecified: Secondary | ICD-10-CM

## 2016-04-07 DIAGNOSIS — R600 Localized edema: Secondary | ICD-10-CM | POA: Diagnosis not present

## 2016-04-07 DIAGNOSIS — R627 Adult failure to thrive: Secondary | ICD-10-CM | POA: Diagnosis not present

## 2016-04-07 DIAGNOSIS — R609 Edema, unspecified: Secondary | ICD-10-CM | POA: Insufficient documentation

## 2016-04-07 LAB — BASIC METABOLIC PANEL: GLUCOSE: 129 mg/dL

## 2016-04-07 NOTE — Assessment & Plan Note (Signed)
D/C Megace

## 2016-04-07 NOTE — Progress Notes (Signed)
Patient ID: Garrett Bradley, male   DOB: 02/19/1934, 82 y.o.   MRN: [id number]     This is a nursing facility follow up for specific acute issues of increasing edema for which he received Lasix with supplemental potassium and elevated glucoses.  Interim medical record and care since last [facility] visit was updated with review of diagnostic studies and change in clinical status since last visit were documented.  HPI: Serial BMETs revealed improvement in renal function but glucoses have ranged up to 309. BNP of 113 did not suggest heart failure. He had been on prednisone until 03/28/16 for acute wrist pain attributed to gout. His uric acid was 10.4.  With initiation of prednisone he has had variable glucoses with values ranging from  129 up to 309. On 03/28/16 his A1c was 7.2% indicating adequate control.   Comprehensive review of systems: He describes his edema as being variable and intermittent. He states that his meals do taste salty. The wrist pain has essentially resolved. Xanax helps his insomnia. He denies any other active cardiopulmonary, endocrine, musculoskeletal symptoms.  Constitutional: No fever,significant weight change, fatigue  Cardiovascular: No chest pain, palpitations,paroxysmal nocturnal dyspnea, claudication  Respiratory: No cough, sputum production,hemoptysis, DOE , significant snoring,apnea   Gastrointestinal: No heartburn,dysphagia,abdominal pain, nausea / vomiting,rectal bleeding, melena,change in bowels Genitourinary: No dysuria,hematuria, pyuria,  incontinence, nocturia Musculoskeletal: No joint stiffness, joint swelling, weakness,pain Dermatologic: No rash, pruritus, change in appearance of skin Neurologic: No dizziness,headache,syncope, seizures, numbness , tingling Psychiatric: No significant anxiety , depression Endocrine: No change in hair/skin/ nails, excessive thirst, excessive hunger, excessive urination  Hematologic/lymphatic: No significant  bruising, lymphadenopathy,abnormal bleeding Allergy/immunology: No itchy/ watery eyes, significant sneezing, urticaria, angioedema  Physical exam:  Pertinent or positive findings: He has a mustache and beard. Ptosis present bilaterally, greater on the right. He has a few lower anterior mandibular teeth He has minor rales at the bases, right lower lobe greater than left lower lobe. He has 1+ edema at the sock line. Pedal pulses are slightly decreased. Foley catheter is in place.  General appearance:Adequately nourished; no acute distress , increased work of breathing is present.   Lymphatic: No lymphadenopathy about the head, neck, axilla . Eyes: No conjunctival inflammation or lid edema is present. There is no scleral icterus. Ears:  External ear exam shows no significant lesions or deformities.   Nose:  External nasal examination shows no deformity or inflammation. Nasal mucosa are pink and moist without lesions ,exudates Oral exam: lips and gums are healthy appearing.There is no oropharyngeal erythema or exudate . Neck:  No thyromegaly, masses, tenderness noted.    Heart:  Normal rate and regular rhythm. S1 and S2 normal without gallop, murmur, click, rub .  Lungs: without wheezes, rhonchi, rubs. Abdomen:Bowel sounds are normal. Abdomen is soft and nontender with no organomegaly, hernias,masses. GU: deferred as previously addressed. Extremities:  No cyanosis, clubbing Neurologic exam : Oriented X3 Skin: Warm & dry w/o tenting. No significant lesions or rash.   #1 edema #2 CKD, improved #3 diabetes, A1c indicates adequate control. Hyperglycemia exacerbation due to recent oral steroids and ? Megace  See orders

## 2016-04-07 NOTE — Assessment & Plan Note (Addendum)
Sodium restriction in diet D/C Megace Consider venous Doppler if edema has not improved with sodium restriction and discontinuation of Megace

## 2016-04-07 NOTE — Patient Instructions (Signed)
D/C Megace as this can potentially aggravate edema, hyperglycemia, and depression. Additionally Megace has increased risk of thrombotic processes.

## 2016-04-07 NOTE — Assessment & Plan Note (Signed)
D/C Megace 

## 2016-04-09 ENCOUNTER — Non-Acute Institutional Stay (SKILLED_NURSING_FACILITY): Payer: Medicare Other | Admitting: Internal Medicine

## 2016-04-09 ENCOUNTER — Encounter: Payer: Self-pay | Admitting: Internal Medicine

## 2016-04-09 DIAGNOSIS — N183 Chronic kidney disease, stage 3 unspecified: Secondary | ICD-10-CM

## 2016-04-09 DIAGNOSIS — E118 Type 2 diabetes mellitus with unspecified complications: Secondary | ICD-10-CM | POA: Diagnosis not present

## 2016-04-09 DIAGNOSIS — R609 Edema, unspecified: Secondary | ICD-10-CM

## 2016-04-09 DIAGNOSIS — R627 Adult failure to thrive: Secondary | ICD-10-CM | POA: Diagnosis not present

## 2016-04-09 NOTE — Progress Notes (Signed)
Patient ID: Garrett Bradley, male   DOB: 03/09/1934, 82 y.o.   MRN: [id number]

## 2016-04-09 NOTE — Progress Notes (Signed)
Location:  [facility]   Place of Service:  SNF (31)   PCP: Colette Ribas, MD Patient Care Team: Assunta Found, MD as PCP - [facility])  Extended Emergency Contact Information Primary Emergency Contact: [place], [place] [address] [place] of [place] Home Phone: [phone] Mobile Phone: [phone] Relation: Son   Goals of care:  Advanced Directive information Advanced Directives 04/09/2016  Does patient have an advance directive? Yes  Type of Advance Directive Living will  Does patient want to make changes to advanced directive? No - Patient declined  Copy of advanced directive(s) in chart? Yes     Allergies  Allergen Reactions  . Thiazide-Type Diuretics     03/2016 acute wrist pain, presumed gout Uric acid 10.4    Chief Complaint  Patient presents with  . Discharge Note    HPI:  82 y.o. male  seeing today for discharge-[place] hospitalized for constipation and weakness with poor by mouth intake and concerns about depression.  He was started on Celexa his renal function improved with IV fluids and management by nephrology.  He also was treated for gout of the right hand and wrist which appears to have resolved status post prednisone.  Patient does have a history of bladder outlet obstruction he does have a chronic indwelling Foley catheter and will need followed by urology.  He also was treated for UTIs Proteus mirabilis with Ceftin.  Patient is a type II diabetic his Levemir was recently increased secondary to elevated blood sugars he also has orders for 5 units of Humalog with meals 3 times a day if CBG is greater than or equal to 300.  I suspect prednisone contributed to the elevated blood sugars.  He also had significant weight gain although he is eating very well now which is encouraging.  He has been taken off Megace earlier this week.  He does have a when necessary Lasix order when he first came in  [place] did receive 40 mg of Lasix earlier this week his weight did go down from 270 down to 262 however last couple days appears to be slowly trending up it is 268 today.  He does not complain of any shortness of breath or chest pain he does have compression hose on.  Patient will be going home-apparently he will be home with his niece-he states he does have a supportive family who visit him frequently.      Past Medical History  Diagnosis Date  . Hypertension   . Diabetes mellitus   . Hypothyroidism   . Depression   . Enlarged prostate     Status post TURP; self caths @ home    Past Surgical History  Procedure Laterality Date  . Hernia repair    . Transurethral resection of prostate  09/22/2011    Procedure: TRANSURETHRAL RESECTION OF THE PROSTATE (TURP);  Surgeon: Ky Barban;  Location: [facility];  Service: Urology;  Laterality: N/A;      reports that he has never smoked. He has never used smokeless tobacco. He reports that he does not drink alcohol or use illicit drugs. Social History   Social History  . Marital Status: Legally Separated    Spouse Name: N/A  . Number of Children: N/A  . Years of Education: N/A   Occupational History  . Not on file.   Social History Main Topics  . Smoking status: Never Smoker   . Smokeless tobacco: Never  Used  . Alcohol Use: No  . Drug Use: No  . Sexual Activity: Not Currently   Other Topics Concern  . Not on file   Social History Narrative   Functional Status Survey:    Allergies  Allergen Reactions  . Thiazide-Type Diuretics     03/2016 acute wrist pain, presumed gout Uric acid 10.4    Pertinent  Health Maintenance Due  Topic Date Due  . FOOT EXAM  12/09/1943  . OPHTHALMOLOGY EXAM  12/09/1943  . URINE MICROALBUMIN  12/09/1943  . PNA vac Low Risk Adult (1 of 2 - PCV13) 12/09/2000  . INFLUENZA VACCINE  07/07/2016  . HEMOGLOBIN A1C  09/27/2016    Medications: Current Outpatient Prescriptions on File Prior to  Visit  Medication Sig Dispense Refill  . acetaminophen (TYLENOL) 325 MG tablet Take two tablets by mouth every 4 hours as needed for pain/temp    . ALPRAZolam (XANAX) 1 MG tablet Take 1 tablet (1 mg total) by mouth at bedtime as needed for sleep. 10 tablet 0  . citalopram (CELEXA) 20 MG tablet Take 1 tablet (20 mg total) by mouth daily.    . clobetasol ointment (TEMOVATE) 0.05 % Apply 1 application topically daily as needed. For legs    . diltiazem (CARDIZEM CD) 300 MG 24 hr capsule Take 300 mg by mouth daily.     . ferrous sulfate 325 (65 FE) MG tablet Take 325 mg by mouth daily.     . fish oil-omega-3 fatty acids 1000 MG capsule Take 1 capsule (1 g total) by mouth 2 (two) times daily.    . Insulin Detemir (LEVEMIR FLEXTOUCH) 100 UNIT/ML Pen Inject 15 Units into the skin at bedtime. (Patient taking differently: Inject 17 units subcutaneous at bedtime)    . Insulin Lispro (HUMALOG KWIKPEN [place]) Give 5 units subcutaneous with meals three times daily if CBG> or = to 300 before meals    . levothyroxine (SYNTHROID, LEVOTHROID) 137 MCG tablet Take 137 mcg by mouth daily.    Marland Kitchen loperamide (IMODIUM) 2 MG capsule Take one tablet by mouth daily as needed    . polyethylene glycol powder (GLYCOLAX/MIRALAX) powder Take 17 g by mouth 2 (two) times daily. Until daily soft stools  OTC 225 g 0  . senna (SENOKOT) 8.6 MG TABS tablet Take 1 tablet (8.6 mg total) by mouth daily. 120 each 0  . Skin Protectants, Misc. (EUCERIN) cream Apply topical to BLE and feet for dry skin twice daily and as needed.    . Travoprost, BAK Free, (TRAVATAN) 0.004 % SOLN ophthalmic solution Place 1 drop into both eyes at bedtime.    . vitamin B-12 (CYANOCOBALAMIN) 1000 MCG tablet Take one tablet by mouth once daily     No current facility-administered medications on file prior to visit.   Of note Levemir is now 17 units daily  Review of Systems   Gen. he does not complain of fever or chills appears she's gained weight recently as  suspect at least some of this is appetite related weight gain.  Skin does not complain of rashes or itching currently.  Head ears eyes nose mouth and throat does not complain of visual changes sore throat.  Respirator does not complain of shortness breath or cough.  Cardiac no chest pain he does have lower extremity edema I would say one plus bilaterally.  GI does not complain of nausea vomiting diarrhea constipation appetite has significantly improved.  GU does have an indwelling Foley catheter.  Musculoskeletal still  has lower extremity weakness but has gained strength is using a walker at times feels he is progressing in this-appears to be more optimistic about this than when I saw him last time.  Neurologic is not complaining of dizziness headache or syncopal-type feelings.  Psych history of depression but this appears to be slowly improving he is more interactive and talking more than initially.   Filed Vitals:   04/09/16 1354  BP: 129/67  Pulse: 69  Temp: 98.7 F (37.1 C)  Resp: 18  Weight: 270 lb 8 oz (122.698 kg)   Body mass index is 32.94 kg/(m^2). Physical Exam   Temperature 98.7 pulse 69 respirations 18 blood pressure 129/67.  In general this is a pleasant elderly male in no distress his skin is warm and dry he does have a mustache and beard.  Skin warm and dry.  Oropharynx is clear mucous membranes moist she has numerous extractions.  Chest is clear to auscultation there is no labored breathing.  Heart is regular rate and rhythm without murmur gallop or rub he continues with 1-2 plus edema compression hose on-pedal pulses are palpable but somewhat reduced.  Abdomen is soft nontender positive bowel sounds somewhat obese.  GU he does have an indwelling Foley catheter draining light amber colored urine.  Musculoskeletal moves all extremities 4 with continued lower extremity weakness is able to ambulate with a walker with therapy but still has significant  weakness.  Neurologic is grossly intact to speech is clear no lateralizing findings.  Psych he is alert and oriented pleasant and appropriate  Labs reviewed: Basic Metabolic Panel:  Recent Labs  40/98/1104/14/17 0527  03/22/16 0543  03/30/16 0600 04/01/16 04/01/16 0740 04/06/16 1300  NA 148*  < > 149*  < > 142 135* 135 132*  K 3.5  < > 4.4  < > 4.6 4.4 4.4 4.6  CL 112*  < > 113*  < > 108  --  106 102  CO2 27  < > 28  < > 25  --  22 22  GLUCOSE 194*  < > 160*  < > 266*  --  214* 305*  BUN 46*  < > 35*  < > 37* 33* 33* 20  CREATININE 2.01*  < > 1.57*  < > 1.45* 1.4* 1.37* 1.28*  CALCIUM 8.7*  < > 9.0  < > 8.6*  --  8.1* 8.3*  PHOS 3.1  --  2.7  --   --   --   --   --   < > = values in this interval not displayed. Liver Function Tests:  Recent Labs  02/24/16 1204 03/11/16 1854 03/16/16 1349 03/20/16 0527 03/22/16 0543  AST 21 37 44*  --   --   ALT 26 56 42  --   --   ALKPHOS 56 57 53  --   --   BILITOT 0.9 1.1 1.0  --   --   PROT 7.7 8.5* 8.4*  --   --   ALBUMIN 3.3* 3.6 3.5 2.8* 2.4*    Recent Labs  03/11/16 1854  LIPASE 28   No results for input(s): AMMONIA in the last 8760 hours. CBC:  Recent Labs  02/24/16 1204 03/11/16 1854 03/16/16 1349  03/20/16 0527 03/21/16 0553 03/30/16 03/30/16 0600  WBC 6.0 7.6 9.5  < > 7.0 8.3 8.7 8.7  NEUTROABS 4.4 5.6 7.5  --   --   --   --   --   HGB 11.7* 13.2  13.5  < > 11.2* 11.5*  --  12.8*  HCT 35.7* 41.4 42.7  < > 35.4* 36.3*  --  39.1  MCV 88.8 91.2 92.4  < > 89.8 89.6  --  87.7  PLT 160 165 141*  < > 95* 103*  --  175  < > = values in this interval not displayed. Cardiac Enzymes: No results for input(s): CKTOTAL, CKMB, CKMBINDEX, TROPONINI in the last 8760 hours. BNP: Invalid input(s): POCBNP CBG:  Recent Labs  03/23/16 0759 03/23/16 1143 03/23/16 1701  GLUCAP 175* 198* 176*    Procedures and Imaging Studies During Stay: Ct Abdomen Pelvis Wo Contrast  03/11/2016  CLINICAL DATA:  Abdominal pain.  No bowel  movements for 4 days. EXAM: CT ABDOMEN AND PELVIS WITHOUT CONTRAST TECHNIQUE: Multidetector CT imaging of the abdomen and pelvis was performed following the standard protocol without IV contrast. COMPARISON:  07/09/2014 FINDINGS: No evidence of bowel obstruction. Oral contrast has progressed through to the ascending colon. Small bowel and stomach are unremarkable. There is a mildly generous volume of colonic stool, without evidence of colonic obstruction. There are unremarkable unenhanced appearances of the liver. There are multiple calculi within the gallbladder lumen, measuring approximately 7 mm. No bile duct dilatation. The spleen is unremarkable. There probably is a 1.4 cm cystic lesion of the pancreatic tail and this is not significantly changed from 07/09/2014 for 09/16/2011. The pancreas is otherwise unremarkable. The adrenals are unremarkable. There is a horseshoe kidney. There are multiple collecting system calculi. The largest right sided calculus measures 5 mm. The largest left-sided calculus measures 1.5 cm, enlarged from 2015 when it measured 8 mm. There are no ureteral calculi. The urinary bladder is decompressed and cannot be evaluated. The abdominal aorta is normal in caliber with mild atherosclerotic calcification. No significant abnormality is evident in the lower chest. No significant musculoskeletal lesion is evident. Moderately severe degenerative lumbar facet disease is present from L4 through S1. IMPRESSION: 1. No acute findings are evident in the abdomen or pelvis. 2. There is a generous volume of colonic stool, without evidence of obstruction. 3. Cholelithiasis. 4. Nephrolithiasis.  Horseshoe kidney. 5. Small cystic lesion of the pancreatic tail, unchanged from 2012. Electronically Signed   By: Ellery Plunk M.D.   On: 03/11/2016 21:14   Dg Wrist Complete Right  03/22/2016  CLINICAL DATA:  82 year old male with pain and swelling in the right wrist for the past several days. No  history of injury. EXAM: RIGHT WRIST - COMPLETE 3+ VIEW COMPARISON:  None. FINDINGS: Multiple views of the right wrist demonstrate no acute displaced fracture, subluxation, dislocation, or soft tissue abnormality. IMPRESSION: No acute radiographic abnormality of the right wrist. Electronically Signed   By: Trudie Reed M.D.   On: 03/22/2016 14:47   US Renal  03/18/2016  CLINICAL DATA:  Acute kidney insufficiency, horseshoe kidney EXAM: RENAL / URINARY TRACT ULTRASOUND COMPLETE COMPARISON:  CT scan 03/11/2016 FINDINGS: Kidneys: Again noted horseshoe kidney deformity. The visualized upper pole of the right kidney measures about 7.2 cm in length. Visualized upper pole and midpole of the left kidney measures 11.3 cm in length. No hydronephrosis. There is a calcified calculus in lower pole region of the left kidney measures 1.1 cm. There is a cyst in midpole of the left kidney measures 1.1 cm. Mild increased renal echogenicity probable due to medical renal disease. Bladder: The urinary bladder is collapsed with Foley catheter. IMPRESSION: Again noted horseshoe kidney deformity. The visualized upper pole of the right  kidney measures about 7.2 cm in length. Visualized upper pole and midpole of the left kidney measures 11.3 cm in length. No hydronephrosis. There is a calcified calculus in lower pole region of the left kidney measures 1.1 cm. There is a cyst in midpole of the left kidney measures 1.1 cm. Mild increased renal echogenicity probable due to medical renal disease. Foley catheter within decompressed urinary bladder. Electronically Signed   By: Natasha Mead M.D.   On: 03/18/2016 10:26   US Venous Img Upper Uni Right  03/20/2016  CLINICAL DATA:  82 year old male with a history of right upper extremity edema. EXAM: RIGHT UPPER EXTREMITY VENOUS DOPPLER ULTRASOUND TECHNIQUE: Gray-scale sonography with graded compression, as well as color Doppler and duplex ultrasound were performed to evaluate the upper  extremity deep venous system from the level of the subclavian vein and including the jugular, axillary, basilic, radial, ulnar and upper cephalic vein. Spectral Doppler was utilized to evaluate flow at rest and with distal augmentation maneuvers. COMPARISON:  None. FINDINGS: Contralateral Subclavian Vein: Respiratory phasicity is normal and symmetric with the symptomatic side. No evidence of thrombus. Normal compressibility. Internal Jugular Vein: No evidence of thrombus. Normal compressibility, respiratory phasicity and response to augmentation. Subclavian Vein: No evidence of thrombus. Normal compressibility, respiratory phasicity and response to augmentation. Axillary Vein: No evidence of thrombus. Normal compressibility, respiratory phasicity and response to augmentation. Cephalic Vein: No evidence of thrombus. Normal compressibility, respiratory phasicity and response to augmentation. Basilic Vein: No evidence of thrombus. Normal compressibility, respiratory phasicity and response to augmentation. Brachial Veins: No evidence of thrombus. Normal compressibility, respiratory phasicity and response to augmentation. Radial Veins: No evidence of thrombus. Normal compressibility, respiratory phasicity and response to augmentation. Ulnar Veins: No evidence of thrombus. Normal compressibility, respiratory phasicity and response to augmentation. Other Findings:  None visualized. IMPRESSION: Sonographic survey of the right upper extremity negative for DVT. Signed, Yvone Neu. Loreta Ave, DO [facility] Electronically Signed   By: Gilmer Mor D.O.   On: 03/20/2016 11:29   Dg Abd Acute W/chest  03/16/2016  CLINICAL DATA:  Constipation for 3 weeks. EXAM: DG ABDOMEN ACUTE W/ 1V CHEST COMPARISON:  Chest radiographs 03/14/2015. CT abdomen and pelvis 03/11/2016 FINDINGS: The cardiomediastinal silhouette is within normal limits. Slight chronic elevation of the right  hemidiaphragm is noted. The lungs are well inflated without evidence of airspace consolidation, edema, pleural effusion, or pneumothorax. No intraperitoneal free air is identified. Residual oral contrast material is present in the proximal colon. A small to at most moderate amount of stool is present in the ascending, transverse, and proximal descending colon. Gas is present in predominantly decompressed distal colon. No dilated loops of bowel suggestive of obstruction are identified. Lumbar spondylosis and renal calculi are noted as demonstrated on recent CT. IMPRESSION: 1. No evidence of acute cardiopulmonary process. 2. Nonobstructed bowel gas pattern. Residual contrast in the proximal colon. Electronically Signed   By: Sebastian Ache M.D.   On: 03/16/2016 13:30    Assessment/Plan:     History of failure to thrive thought possibly multifactorial with weakness poor appetite-depression.  This appears to be somewhat improved he is eating much better his plate was cleaned today-he has been taken off Megace.  He has had fairly significant weight gain here I suspect some of this is appetite related-h.  #2 history of edema-again he did receive a dose of Lasix earlier this week and BMP was unremarkable at 113-he is slowly gaining back weight lost apparently  with the Lasix.--Per. Assessment by Dr. Alwyn Ren consideration of venous Dopplers if edema persists will obtain this to rule out DVT-I note he had been on Megace.  Also will give Lasix 20 mg daily for 2 days with 20 mEq of potassium 1 dose-update a metabolic panel tomorrow he will need expedient follow-up by primary care provider--does not complain of any shortness of breath or chest pain clinically appears to be stable  #3 history of diabetes type 2-in his Levemir has recently been increased continue with NovoLog for CBGs greater than 300 at meals 5 units this appears to be moderating some now that he is off the prednisone this will need follow-up by  primary care provider.  #4 history of gout-right upper extremity edema pain at the wrist appears resolved status post prednisone.  #5 history depression-he was seen by psychiatry in the hospital was started on Celexa this appears to be helping some he is more interactive eating much better this will need follow-up by primary care provider #6 history of anemiashe is on ironrecent hemoglobin 12.8 appears to be stable he did have mild thrombocytopenia which appears resolved with a platelet count of 1 75,000 on lab done April 24.  #7-history of bladder outline obstruction knee is status post Foley catheter this will warm follow-up by urology.  #8 history of hypothyroidism-TSH was 2.658 on lab done 03/16/2016.  #9 history of orthostatic hypotension his Hytrin was discontinued in the hospital he is wearing compression hose this apparently has not been an issue during his stay here   Again patient will be going home he will need continued PT and OT for strengthening he tells me he has close family members who are with him much of the time he does have a walker at home  [phone] note greater than 30 minutes spent on this discharge summary-greater than 50% of time spent coordinating plan of care for numerous diagnoses.

## 2016-04-10 ENCOUNTER — Encounter (HOSPITAL_COMMUNITY)
Admission: RE | Admit: 2016-04-10 | Discharge: 2016-04-10 | Disposition: A | Discharge status: Home / Self Care | Payer: Medicare Other | Source: Skilled Nursing Facility | Attending: Internal Medicine | Admitting: Internal Medicine

## 2016-04-10 LAB — BASIC METABOLIC PANEL
Anion gap: 8 (ref 5–15)
BUN: 20 mg/dL (ref 6–20)
CALCIUM: 8.4 mg/dL — AB (ref 8.9–10.3)
CHLORIDE: 104 mmol/L (ref 101–111)
CO2: 23 mmol/L (ref 22–32)
CREATININE: 1.33 mg/dL — AB (ref 0.61–1.24)
GFR calc non Af Amer: 49 mL/min — ABNORMAL LOW (ref >60)
GFR, EST AFRICAN AMERICAN: 57 mL/min — AB (ref >60)
Glucose, Bld: 194 mg/dL — ABNORMAL HIGH (ref 65–99)
Potassium: 4.2 mmol/L (ref 3.5–5.1)
SODIUM: 135 mmol/L (ref 135–145)

## 2016-04-11 ENCOUNTER — Inpatient Hospital Stay (HOSPITAL_COMMUNITY): Payer: Medicare Other | Attending: Internal Medicine

## 2016-04-11 DIAGNOSIS — I82432 Acute embolism and thrombosis of left popliteal vein: Secondary | ICD-10-CM | POA: Diagnosis not present

## 2016-04-13 ENCOUNTER — Encounter (HOSPITAL_COMMUNITY)
Admission: RE | Admit: 2016-04-13 | Discharge: 2016-04-13 | Disposition: A | Discharge status: Home / Self Care | Payer: Medicare Other | Source: Skilled Nursing Facility | Attending: Internal Medicine | Admitting: Internal Medicine

## 2016-04-13 DIAGNOSIS — M25531 Pain in right wrist: Secondary | ICD-10-CM

## 2016-04-13 DIAGNOSIS — I82432 Acute embolism and thrombosis of left popliteal vein: Secondary | ICD-10-CM

## 2016-04-13 DIAGNOSIS — I5189 Other ill-defined heart diseases: Secondary | ICD-10-CM | POA: Insufficient documentation

## 2016-04-13 DIAGNOSIS — I82409 Acute embolism and thrombosis of unspecified deep veins of unspecified lower extremity: Secondary | ICD-10-CM | POA: Insufficient documentation

## 2016-04-13 DIAGNOSIS — R609 Edema, unspecified: Secondary | ICD-10-CM

## 2016-04-13 LAB — BASIC METABOLIC PANEL
ANION GAP: 7 (ref 5–15)
BUN: 15 mg/dL (ref 6–20)
BUN: 15 mg/dL (ref 4–21)
CHLORIDE: 103 mmol/L (ref 101–111)
CO2: 26 mmol/L (ref 22–32)
Calcium: 8.4 mg/dL — ABNORMAL LOW (ref 8.9–10.3)
Creatinine, Ser: 1.08 mg/dL (ref 0.61–1.24)
Creatinine: 1.1 mg/dL (ref <=1.3)
GFR calc Af Amer: >60 mL/min (ref >60)
GLUCOSE: 133 mg/dL — AB (ref 65–99)
Glucose: 133 mg/dL
POTASSIUM: 3.8 mmol/L (ref 3.5–5.1)
Potassium: 3.8 mmol/L (ref 3.4–5.3)
SODIUM: 136 mmol/L — AB (ref 137–147)
Sodium: 136 mmol/L (ref 135–145)

## 2016-04-14 ENCOUNTER — Encounter: Payer: Self-pay | Admitting: Internal Medicine

## 2016-04-14 ENCOUNTER — Non-Acute Institutional Stay (SKILLED_NURSING_FACILITY): Payer: Medicare Other | Admitting: Internal Medicine

## 2016-04-14 ENCOUNTER — Other Ambulatory Visit: Payer: Self-pay | Admitting: Internal Medicine

## 2016-04-14 DIAGNOSIS — R609 Edema, unspecified: Secondary | ICD-10-CM | POA: Diagnosis not present

## 2016-04-14 DIAGNOSIS — Z794 Long term (current) use of insulin: Secondary | ICD-10-CM

## 2016-04-14 DIAGNOSIS — E118 Type 2 diabetes mellitus with unspecified complications: Secondary | ICD-10-CM | POA: Diagnosis not present

## 2016-04-14 DIAGNOSIS — I5189 Other ill-defined heart diseases: Secondary | ICD-10-CM

## 2016-04-14 DIAGNOSIS — I519 Heart disease, unspecified: Secondary | ICD-10-CM | POA: Diagnosis not present

## 2016-04-14 DIAGNOSIS — I82432 Acute embolism and thrombosis of left popliteal vein: Secondary | ICD-10-CM | POA: Diagnosis not present

## 2016-04-14 LAB — HEMOGLOBIN A1C
Hgb A1c MFr Bld: 8.8 % — ABNORMAL HIGH (ref 4.8–5.6)
Mean Plasma Glucose: 206 mg/dL

## 2016-04-14 NOTE — Assessment & Plan Note (Signed)
Repeat 2 Dechocardiogram to assess current cardiac status

## 2016-04-14 NOTE — Assessment & Plan Note (Signed)
2-D echocardiogram will be ordered to assess status of cardiac function in view of the persistent, significant edema

## 2016-04-14 NOTE — Assessment & Plan Note (Signed)
Increase basal insulin to 20 units and sliding scale to 5 units before meals 3 times a day if ac glucose over 300

## 2016-04-14 NOTE — Progress Notes (Signed)
   Facility Location: [facility] Room Number: 127-W  PCP: Colette RibasGOLDING, [name], MD [address] [place] [id number]   This is a nursing facility follow up for specific acute issues of acute left popliteal thrombus in the context of recalcitrant lower extremity edema.  Because of  persistent edema; venous Doppler was performed 04/11/16 which revealed a nonocclusive left popliteal vein rhombus. Xarelto was initiated.   Interim medical record and care since last [facility] visit was updated with review of diagnostic studies and change in clinical status since last visit were documented.  He has had intermittent thrombocytopenia but this had resolved as of 4/24. Additionally he's had mild anemia which has been improving serially. At this time he denies active cardiopulmonary symptoms except for some cough with white sputum. He denies any bleeding dyscrasias. The etiology of his edema is unclear. He has had mild variable elevations of his creatinine as well as minimal elevations of liver function test. TSH was therapeutic. An echocardiogram in 2014 did reveal severe left ventricular hypertrophy. Ejection fraction was 55-60 percent .Moderate left atrial enlargement was present as was grade 1 diastolic dysfunction. Generic Demadex 20 mg daily has been initiated as of 04/13/16. His A1c was 8.8 percent on 17 units of Levemir. He was also receiving 3 units of sliding scale insulin before meals if the glucose was over 300.  Note: only family and social history pertinent to this assessment are included.Father had MI  Comprehensive review of systems: He states that his edema has improved since he's been on bedrest after the results of the Doppler. Denied are chest pain, palpitations, claudication,  paroxysmal nocturnal dyspnea, exertional dyspnea,  or hemoptysis. Epistaxis, hemoptysis, hematuria, melena, or rectal bleeding denied. No unexplained weight loss, significant  dyspepsia,dysphagia, or abdominal pain.  There is no abnormal bruising , bleeding, or difficulty stopping bleeding with injury.  Pertinent or positive findings include: Ptosis is present greater on the right than the left. Arcus senilis is noted. Heart rhythm and rate are slightly irregular. He has rales at both bases. Ventral hernias present.  There is hyperpigmentation over the shins which is bland in appearance. There is 1+ edema at the sock line.  Positive Denna HaggardHomans is suggested on the left.  General appearance :adequately nourished; in no distress. No increased work of breathing.  Abdomen: bowel sounds normal, soft and non-tender without masses, organomegaly or hernias noted.  No guarding or rebound.  Vascular : all pulses equal ; no bruits present.No HJR or NVD Skin:Warm & dry.  Intact without suspicious lesions or rashes ; no tenting or jaundice  Lymphatic: No lymphadenopathy is noted about the head, neck, axilla   See summary under each active problem in the Problem List with associated updated therapeutic plan

## 2016-04-14 NOTE — Assessment & Plan Note (Signed)
He will be monitored for any active bleeding on the Xarelto His thrombocytopenia with platelet count of 95,000 has resolved as of 4/24. Additionally his anemia has improved. His most recent hemoglobin was 12.8

## 2016-04-14 NOTE — Patient Instructions (Addendum)
See new orders for echocardiogram;CBC and differential; and new orders for basal and sliding scale insulin.

## 2016-04-15 ENCOUNTER — Other Ambulatory Visit (HOSPITAL_COMMUNITY)
Admission: AD | Admit: 2016-04-15 | Discharge: 2016-04-15 | Disposition: A | Discharge status: Home / Self Care | Payer: Medicare Other | Source: Skilled Nursing Facility | Attending: Internal Medicine | Admitting: Internal Medicine

## 2016-04-15 ENCOUNTER — Non-Acute Institutional Stay (SKILLED_NURSING_FACILITY): Payer: Medicare Other | Admitting: Internal Medicine

## 2016-04-15 DIAGNOSIS — E1165 Type 2 diabetes mellitus with hyperglycemia: Secondary | ICD-10-CM | POA: Insufficient documentation

## 2016-04-15 DIAGNOSIS — R609 Edema, unspecified: Secondary | ICD-10-CM

## 2016-04-15 DIAGNOSIS — D631 Anemia in chronic kidney disease: Secondary | ICD-10-CM

## 2016-04-15 DIAGNOSIS — N189 Chronic kidney disease, unspecified: Secondary | ICD-10-CM

## 2016-04-15 DIAGNOSIS — I82402 Acute embolism and thrombosis of unspecified deep veins of left lower extremity: Secondary | ICD-10-CM

## 2016-04-15 LAB — CBC
HEMATOCRIT: 26.3 % — AB (ref 39.0–52.0)
Hemoglobin: 9.1 g/dL — ABNORMAL LOW (ref 13.0–17.0)
MCH: 29.3 pg (ref 26.0–34.0)
MCHC: 34.6 g/dL (ref 30.0–36.0)
MCV: 84.6 fL (ref 78.0–100.0)
PLATELETS: 215 10*3/uL (ref 150–400)
RBC: 3.11 MIL/uL — ABNORMAL LOW (ref 4.22–5.81)
RDW: 15.4 % (ref 11.5–15.5)
WBC: 7.5 10*3/uL (ref 4.0–10.5)

## 2016-04-15 LAB — CBC AND DIFFERENTIAL
HEMATOCRIT: 26 % — AB (ref 41–53)
Hemoglobin: 9.1 g/dL — AB (ref 13.5–17.5)
Platelets: 215 10*3/uL (ref 150–399)
WBC: 7.5 10^3/mL

## 2016-04-15 NOTE — Progress Notes (Signed)
Patient ID: Garrett Bradley, male   DOB: March 04, 1934, 82 y.o.   MRN: [id number]   Location:  [facility] Room Number: 127 Place of Service:  SNF [phone]) Provider:  Georgeanna Lea, MD  Patient Care Team: Assunta Found, MD as PCP - [facility])  Extended Emergency Contact Information Primary Emergency Contact: [place], [place] [address] [place] of [place] Home Phone: [phone] Mobile Phone: [phone] Relation: Son  Code Status:  Full Code Goals of care: Advanced Directive information Advanced Directives 04/15/2016  Does patient have an advance directive? Yes  Type of Advance Directive (No Data)  Does patient want to make changes to advanced directive? No - Patient declined  Copy of advanced directive(s) in chart? Yes     Chief Complaint  Patient presents with  . Acute Visit    Anemia    HPI:  Pt is a 82 y.o. male seen today for an acute visit for 4 hemoglobin of 9.1-previous hemoglobins have been in the 11--12 range and actually appeared to be going up  He does have a history of chronic renal disease however most recent creatinine of 1.08 showed improvement.  He has just recently been started on Xarelto secondary to a history of left leg DVT appears to be tolerating this well.  He complains of generalized weakness but this does not appear to be acutely changed from baseline.  He does have a history of diastolic dysfunction edema appears to be relatively stable actually slightly improved.  He denies any acute rectal bleeding       Past Medical History  Diagnosis Date  . Hypertension   . Diabetes mellitus   . Hypothyroidism   . Depression   . Enlarged prostate     Status post TURP; self caths @ home   Past Surgical History  Procedure Laterality Date  . Hernia repair    . Transurethral resection of prostate  09/22/2011    Procedure: TRANSURETHRAL RESECTION OF THE PROSTATE (TURP);   Surgeon: Ky Barban;  Location: [facility];  Service: Urology;  Laterality: N/A;    Allergies  Allergen Reactions  . Thiazide-Type Diuretics     03/2016 acute wrist pain, presumed gout Uric acid 10.4      Medication List    Notice    This visit is during an admission. Changes to the med list made in this visit will be reflected in the After Visit Summary of the admission.     Outpatient Encounter Prescriptions as of 04/15/2016  Medication Sig  . acetaminophen (TYLENOL) 325 MG tablet Take two tablets by mouth every 4 hours as needed for pain/temp  . ALPRAZolam (XANAX) 1 MG tablet Take 1 tablet (1 mg total) by mouth at bedtime as needed for sleep.  . citalopram (CELEXA) 20 MG tablet Take 1 tablet (20 mg total) by mouth daily.  . clobetasol ointment (TEMOVATE) 0.05 % Apply 1 application topically daily as needed. For legs  . diltiazem (CARDIZEM CD) 300 MG 24 hr capsule Take 300 mg by mouth daily.   . ferrous sulfate 325 (65 FE) MG tablet Take 325 mg by mouth daily.   . fish oil-omega-3 fatty acids 1000 MG capsule Take 1 capsule (1 g total) by mouth 2 (two) times daily.  Marland Kitchen GLUCERNA (GLUCERNA) LIQD Take 237 mLs by mouth 2 (two) times daily between meals.  . insulin detemir (LEVEMIR) 100 UNIT/ML injection Inject 20  Units into the skin at bedtime.   Marland Kitchen. levothyroxine (SYNTHROID, LEVOTHROID) 137 MCG tablet Take 137 mcg by mouth daily.  Marland Kitchen. loperamide (IMODIUM) 2 MG capsule Take one tablet by mouth daily as needed  . polyethylene glycol powder (GLYCOLAX/MIRALAX) powder Take 17 g by mouth 2 (two) times daily. Until daily soft stools  OTC  . Potassium Chloride ER 20 MEQ TBCR Take 1 tablet by mouth twice a day  . Rivaroxaban (XARELTO) 15 MG TABS tablet Take 15 mg by mouth 2 (two) times daily with a meal. X 20 days starting 04/11/16  . senna (SENOKOT) 8.6 MG TABS tablet Take 1 tablet (8.6 mg total) by mouth daily.  . Skin Protectants, Misc. (EUCERIN) cream Apply topical to BLE and feet for dry skin  twice daily and as needed.  . Travoprost, BAK Free, (TRAVATAN) 0.004 % SOLN ophthalmic solution Place 1 drop into both eyes at bedtime.  . vitamin B-12 (CYANOCOBALAMIN) 1000 MCG tablet Take one tablet by mouth once daily  . [DISCONTINUED] Insulin Lispro (HUMALOG KWIKPEN [place]) Give 5 units subcutaneous with meals three times daily if CBG> or = to 300 before meals   No facility-administered encounter medications on file as of 04/15/2016.   Review of Systems   Is not complaining of fever chills says he continues to feel weak.  Skin does not complain of rashes or itching.  Head ears eyes nose mouth and throat does not complain of visual changes or sore throat.  Respirate does not complain of shortness breath or cough.  Cardiac no chest pain he does continue with fairly significant lower extremity edema although this appears slightly improved from previous exams.  GU is not complaining of dysuria has an indwelling Foley catheter.  GI is not complaining of abdominal pain nausea vomiting diarrhea constipation.  Musculoskeletal other than generalized weakness does not complain of pain currently.  Neurologic is not complaining of syncope dizziness or headache.  Psych was concern about depression still has somewhat of a flat affect but he is eating apparently significantly better.    Immunization History  Administered Date(s) Administered  . PPD Test 03/23/2016, 04/06/2016   Pertinent  Health Maintenance Due  Topic Date Due  . FOOT EXAM  12/09/1945  . OPHTHALMOLOGY EXAM  12/09/1945  . URINE MICROALBUMIN  12/09/1945  . PNA vac Low Risk Adult (1 of 2 - PCV13) 12/09/2000  . INFLUENZA VACCINE  07/07/2016  . HEMOGLOBIN A1C  10/14/2016   No flowsheet data found. Functional Status Survey:    Filed Vitals:   04/15/16 1145  BP: 153/76  Pulse: 67  Temp: 97.9 F (36.6 C)  TempSrc: Oral  Resp: 24  Height: 6\' 4"  (1.93 m)  Weight: 267 lb (121.11 kg)  SpO2: 98%   Body mass index is  32.51 kg/(m^2). Physical Exam   In general this is an somewhat obese elderly male in no distress sitting comfortably in his wheelchair.  His skin is warm and dry.  Eyes he does have arcus senile and slight  ptosis bilaterally visual acuity appears grossly intact.  Chest is clear to auscultation with somewhat shallow air entry there is no labored breathing.  Heart is regular rate and rhythm with occasional irregular beat I will see has 1+ lower extremity edema-venous stasis changes this appears slightly improved from previous exam.  Pedal pulses are reduced bilaterally.  Abdomen is soft obese nontender he does have ventral hernias bowel sounds are positive.  GU has an indwelling Foley catheter draining amber colored  urine  Muscle skeletal does have lower extremity weakness able to transfer to bed although still has some weakness here but did not note any deformities he does have some increased edema of his right arm versus the left radial pulses intact her nursing this is relatively baseline there is no tenderness to palpation of the edema.  Neurologic is grossly intact to speech is clear.  Psych he is alert and oriented pleasant and appropriate has somewhat of a flat also fact.  Of note a rectal exam was performed this was negative for occult blood.        Labs reviewed:  Recent Labs  03/20/16 0527  03/22/16 0543  04/06/16 1300 04/10/16 0800 04/13/16 04/13/16 0545  NA 148*  < > 149*  < > 132* 135 136* 136  K 3.5  < > 4.4  < > 4.6 4.2 3.8 3.8  CL 112*  < > 113*  < > 102 104  --  103  CO2 27  < > 28  < > 22 23  --  26  GLUCOSE 194*  < > 160*  < > 305* 194*  --  133*  BUN 46*  < > 35*  < > CREATININE 2.01*  < > 1.57*  < > 1.28* 1.33* 1.1 1.08  CALCIUM 8.7*  < > 9.0  < > 8.3* 8.4*  --  8.4*  PHOS 3.1  --  2.7  --   --   --   --   --   < > = values in this interval not displayed.  Recent Labs  02/24/16 1204 03/11/16 1854 03/16/16 1349 03/20/16 0527  03/22/16 0543  AST 21 37 44*  --   --   ALT 26 56 42  --   --   ALKPHOS 56 57 53  --   --   BILITOT 0.9 1.1 1.0  --   --   PROT 7.7 8.5* 8.4*  --   --   ALBUMIN 3.3* 3.6 3.5 2.8* 2.4*    Recent Labs  02/24/16 1204 03/11/16 1854 03/16/16 1349  03/21/16 0553  03/30/16 0600 04/15/16 04/15/16 0548  WBC 6.0 7.6 9.5  < > 8.3  < > 8.7 7.5 7.5  NEUTROABS 4.4 5.6 7.5  --   --   --   --   --   --   HGB 11.7* 13.2 13.5  < > 11.5*  --  12.8* 9.1* 9.1*  HCT 35.7* 41.4 42.7  < > 36.3*  --  39.1 26* 26.3*  MCV 88.8 91.2 92.4  < > 89.6  --  87.7  --  84.6  PLT 160 165 141*  < > 103*  --  175 215 215  < > = values in this interval not displayed. Lab Results  Component Value Date   TSH 2.658 03/16/2016   Lab Results  Component Value Date   HGBA1C 8.8* 04/13/2016   No results found for: CHOL, HDL, LDLCALC, LDLDIRECT, TRIG, CHOLHDL  Significant Diagnostic Results in last 30 days:  Dg Wrist Complete Right  03/22/2016  CLINICAL DATA:  82 year old male with pain and swelling in the right wrist for the past several days. No history of injury. EXAM: RIGHT WRIST - COMPLETE 3+ VIEW COMPARISON:  None. FINDINGS: Multiple views of the right wrist demonstrate no acute displaced fracture, subluxation, dislocation, or soft tissue abnormality. IMPRESSION: No acute radiographic abnormality of the right wrist. Electronically Signed   By: Reuel Boom  Entrikin M.D.   On: 03/22/2016 14:47   US Renal  03/18/2016  CLINICAL DATA:  Acute kidney insufficiency, horseshoe kidney EXAM: RENAL / URINARY TRACT ULTRASOUND COMPLETE COMPARISON:  CT scan 03/11/2016 FINDINGS: Kidneys: Again noted horseshoe kidney deformity. The visualized upper pole of the right kidney measures about 7.2 cm in length. Visualized upper pole and midpole of the left kidney measures 11.3 cm in length. No hydronephrosis. There is a calcified calculus in lower pole region of the left kidney measures 1.1 cm. There is a cyst in midpole of the left kidney  measures 1.1 cm. Mild increased renal echogenicity probable due to medical renal disease. Bladder: The urinary bladder is collapsed with Foley catheter. IMPRESSION: Again noted horseshoe kidney deformity. The visualized upper pole of the right kidney measures about 7.2 cm in length. Visualized upper pole and midpole of the left kidney measures 11.3 cm in length. No hydronephrosis. There is a calcified calculus in lower pole region of the left kidney measures 1.1 cm. There is a cyst in midpole of the left kidney measures 1.1 cm. Mild increased renal echogenicity probable due to medical renal disease. Foley catheter within decompressed urinary bladder. Electronically Signed   By: Natasha Mead M.D.   On: 03/18/2016 10:26   US Venous Img Lower Bilateral  04/11/2016  CLINICAL DATA:  BILATERAL deep venous thrombosis, leg swelling, diabetes mellitus, hypertension all EXAM: BILATERAL LOWER EXTREMITY VENOUS DOPPLER ULTRASOUND TECHNIQUE: Gray-scale sonography with graded compression, as well as color Doppler and duplex ultrasound were performed to evaluate the lower extremity deep venous systems from the level of the common femoral vein and including the common femoral, femoral, profunda femoral, popliteal and calf veins including the posterior tibial, peroneal and gastrocnemius veins when visible. The superficial great saphenous vein was also interrogated. Spectral Doppler was utilized to evaluate flow at rest and with distal augmentation maneuvers in the common femoral, femoral and popliteal veins. COMPARISON:  None. FINDINGS: RIGHT LOWER EXTREMITY Common Femoral Vein: No evidence of thrombus. Normal compressibility, respiratory phasicity and response to augmentation. Saphenofemoral Junction: No evidence of thrombus. Normal compressibility and flow on color Doppler imaging. Profunda Femoral Vein: No evidence of thrombus. Normal compressibility and flow on color Doppler imaging. Femoral Vein: No evidence of thrombus. Normal  compressibility, respiratory phasicity and response to augmentation. Popliteal Vein: No evidence of thrombus. Normal compressibility, respiratory phasicity and response to augmentation. Calf Veins: Limited visualization. No gross evidence of thrombus. Normal compressibility and flow on color Doppler imaging at the visualized vessels. Superficial Great Saphenous Vein: No evidence of thrombus. Normal compressibility and flow on color Doppler imaging. Venous Reflux:  None. Other Findings:  Subcutaneous edema RIGHT calf and ankle. LEFT LOWER EXTREMITY Common Femoral Vein: No evidence of thrombus. Normal compressibility, respiratory phasicity and response to augmentation. Saphenofemoral Junction: No evidence of thrombus. Normal compressibility and flow on color Doppler imaging. Profunda Femoral Vein: No evidence of thrombus. Normal compressibility and flow on color Doppler imaging. Femoral Vein: No evidence of thrombus. Normal compressibility, respiratory phasicity and response to augmentation. Popliteal Vein: Hypoechoic nonocclusive thrombus LEFT popliteal vein with impaired compressibility and spontaneous flow. Calf Veins: No evidence of thrombus. Normal compressibility and flow on color Doppler imaging. Superficial Great Saphenous Vein: No evidence of thrombus. Normal compressibility and flow on color Doppler imaging. Venous Reflux:  None. Other Findings:  Subcutaneous edema LEFT calf and ankle. IMPRESSION: Nonocclusive deep venous thrombosis LEFT popliteal vein. No evidence of deep venous thrombosis in RIGHT lower extremity. Subcutaneous edema at BILATERAL calves  and ankles. Findings called to [facility] RN at [facility] on 04/11/2016 at 1720 hours. Electronically Signed   By: Ulyses Southward M.D.   On: 04/11/2016 17:21   US Venous Img Upper Uni Right  03/20/2016  CLINICAL DATA:  82 year old male with a history of right upper extremity edema. EXAM: RIGHT UPPER EXTREMITY VENOUS DOPPLER ULTRASOUND TECHNIQUE: Gray-scale sonography  with graded compression, as well as color Doppler and duplex ultrasound were performed to evaluate the upper extremity deep venous system from the level of the subclavian vein and including the jugular, axillary, basilic, radial, ulnar and upper cephalic vein. Spectral Doppler was utilized to evaluate flow at rest and with distal augmentation maneuvers. COMPARISON:  None. FINDINGS: Contralateral Subclavian Vein: Respiratory phasicity is normal and symmetric with the symptomatic side. No evidence of thrombus. Normal compressibility. Internal Jugular Vein: No evidence of thrombus. Normal compressibility, respiratory phasicity and response to augmentation. Subclavian Vein: No evidence of thrombus. Normal compressibility, respiratory phasicity and response to augmentation. Axillary Vein: No evidence of thrombus. Normal compressibility, respiratory phasicity and response to augmentation. Cephalic Vein: No evidence of thrombus. Normal compressibility, respiratory phasicity and response to augmentation. Basilic Vein: No evidence of thrombus. Normal compressibility, respiratory phasicity and response to augmentation. Brachial Veins: No evidence of thrombus. Normal compressibility, respiratory phasicity and response to augmentation. Radial Veins: No evidence of thrombus. Normal compressibility, respiratory phasicity and response to augmentation. Ulnar Veins: No evidence of thrombus. Normal compressibility, respiratory phasicity and response to augmentation. Other Findings:  None visualized. IMPRESSION: Sonographic survey of the right upper extremity negative for DVT. Signed, Yvone Neu. Loreta Ave, DO [facility] Electronically Signed   By: Gilmer Mor D.O.   On: 03/20/2016 11:29   Dg Abd Acute W/chest  03/16/2016  CLINICAL DATA:  Constipation for 3 weeks. EXAM: DG ABDOMEN ACUTE W/ 1V CHEST COMPARISON:  Chest radiographs 03/14/2015. CT abdomen and pelvis 03/11/2016  FINDINGS: The cardiomediastinal silhouette is within normal limits. Slight chronic elevation of the right hemidiaphragm is noted. The lungs are well inflated without evidence of airspace consolidation, edema, pleural effusion, or pneumothorax. No intraperitoneal free air is identified. Residual oral contrast material is present in the proximal colon. A small to at most moderate amount of stool is present in the ascending, transverse, and proximal descending colon. Gas is present in predominantly decompressed distal colon. No dilated loops of bowel suggestive of obstruction are identified. Lumbar spondylosis and renal calculi are noted as demonstrated on recent CT. IMPRESSION: 1. No evidence of acute cardiopulmonary process. 2. Nonobstructed bowel gas pattern. Residual contrast in the proximal colon. Electronically Signed   By: Sebastian Ache M.D.   On: 03/16/2016 13:30    Assessment/Plan   #1 anemia-labs have been also suspected Dr. Alwyn Ren update lab as been ordered for tomorrow including a CBC--and B12 level.  This is chromic normocytic anemia-iron was low on lab done last month but ferritin was within normal range-occult blood testing today was negative.  Will await updated lab tomorrow clinically appears stable continues to complain of being weak but this is not a new complaint he is eating better.  #2 history of edema with diastolic CHF Dr. Alwyn Ren has ordered an updated cardiac echo-after initial weight gain this appears to have stabilized again weight gain could be due to a better appetite as well most recent weight I see is 267.5 this appears to be moderating it was as high as 270 this will have to be watched edema appears to be slowly  improving.  #3 history of left leg DVT again he is on Xarelto his hemoglobin will have to be monitored again edema appears to be improved somewhat.  [id number]

## 2016-04-16 ENCOUNTER — Encounter (HOSPITAL_COMMUNITY)
Admission: AD | Admit: 2016-04-16 | Discharge: 2016-04-16 | Disposition: A | Discharge status: Home / Self Care | Payer: Medicare Other | Source: Skilled Nursing Facility | Attending: Internal Medicine | Admitting: Internal Medicine

## 2016-04-16 LAB — CBC
HEMATOCRIT: 29.2 % — AB (ref 39.0–52.0)
HEMOGLOBIN: 9.9 g/dL — AB (ref 13.0–17.0)
MCH: 28.5 pg (ref 26.0–34.0)
MCHC: 33.9 g/dL (ref 30.0–36.0)
MCV: 84.1 fL (ref 78.0–100.0)
Platelets: 284 10*3/uL (ref 150–400)
RBC: 3.47 MIL/uL — ABNORMAL LOW (ref 4.22–5.81)
RDW: 15.5 % (ref 11.5–15.5)
WBC: 8.3 10*3/uL (ref 4.0–10.5)

## 2016-04-17 ENCOUNTER — Non-Acute Institutional Stay (SKILLED_NURSING_FACILITY): Payer: Medicare Other | Admitting: Internal Medicine

## 2016-04-17 ENCOUNTER — Encounter: Payer: Self-pay | Admitting: Internal Medicine

## 2016-04-17 DIAGNOSIS — D508 Other iron deficiency anemias: Secondary | ICD-10-CM | POA: Diagnosis not present

## 2016-04-17 DIAGNOSIS — I82402 Acute embolism and thrombosis of unspecified deep veins of left lower extremity: Secondary | ICD-10-CM

## 2016-04-17 DIAGNOSIS — M25532 Pain in left wrist: Secondary | ICD-10-CM

## 2016-04-17 DIAGNOSIS — M1000 Idiopathic gout, unspecified site: Secondary | ICD-10-CM

## 2016-04-17 NOTE — Progress Notes (Signed)
Location:  [facility] Room Number: 127/W Place of Service:  SNF [phone]) Provider:  Georgeanna Lea, MD  Patient Care Team: Assunta Found, MD as PCP - General [facility] Medicine)  Extended Emergency Contact Information Primary Emergency Contact: [place], [place] [address] [place] Home Phone: [phone] Mobile Phone: [phone] Relation: Son  Goals of care: Advanced Directive information Advanced Directives 04/17/2016  Does patient have an advance directive? Yes  Type of Advance Directive -  Does patient want to make changes to advanced directive? No - Patient declined  Copy of advanced directive(s) in chart? Yes     Chief complaint acute visit secondary to left wrist discomfort  HPI:  Pt is a 82 y.o. male seen today for an acute visit for pain of his left wrist-apparently this is developed the last day-he does have a history of suspected gout flare most recently of his right arm and wrist-this did respond to a prolonged course of prednisone.  He is also being treated with Xarelto for a left leg DVT which appears to be stable.  Currently does not complain of any shortness of breath does complain of discomfort of his left wrist he describes as gout-like pain.     Past Medical History  Diagnosis Date  . Hypertension   . Diabetes mellitus   . Hypothyroidism   . Depression   . Enlarged prostate     Status post TURP; self caths @ home   Past Surgical History  Procedure Laterality Date  . Hernia repair    . Transurethral resection of prostate  09/22/2011    Procedure: TRANSURETHRAL RESECTION OF THE PROSTATE (TURP);  Surgeon: Ky Barban;  Location: [facility];  Service: Urology;  Laterality: N/A;    Allergies  Allergen Reactions  . Thiazide-Type Diuretics     03/2016 acute wrist pain, presumed gout Uric acid 10.4    Current Outpatient Prescriptions on File Prior to Visit  Medication Sig  Dispense Refill  . acetaminophen (TYLENOL) 325 MG tablet Take two tablets by mouth every 4 hours as needed for pain/temp    . ALPRAZolam (XANAX) 1 MG tablet Take 1 tablet (1 mg total) by mouth at bedtime as needed for sleep. 10 tablet 0  . citalopram (CELEXA) 20 MG tablet Take 1 tablet (20 mg total) by mouth daily.    . clobetasol ointment (TEMOVATE) 0.05 % Apply 1 application topically daily as needed. For legs    . diltiazem (CARDIZEM CD) 300 MG 24 hr capsule Take 300 mg by mouth daily.     . ferrous sulfate 325 (65 FE) MG tablet Take 325 mg by mouth daily.     . fish oil-omega-3 fatty acids 1000 MG capsule Take 1 capsule (1 g total) by mouth 2 (two) times daily.    Marland Kitchen GLUCERNA (GLUCERNA) LIQD Take 237 mLs by mouth 2 (two) times daily between meals.    . insulin detemir (LEVEMIR) 100 UNIT/ML injection Inject 20 Units into the skin at bedtime.     Marland Kitchen levothyroxine (SYNTHROID, LEVOTHROID) 137 MCG tablet Take 137 mcg by mouth daily.    Marland Kitchen loperamide (IMODIUM) 2 MG capsule Take 2 tablet by mouth daily as needed    . polyethylene glycol powder (GLYCOLAX/MIRALAX) powder Take 17 g by mouth 2 (two) times daily. Until daily soft stools  OTC 225 g 0  . Potassium Chloride ER 20 MEQ TBCR Take 1 tablet  by mouth twice a day    . Rivaroxaban (XARELTO) 15 MG TABS tablet Take 15 mg by mouth 2 (two) times daily with a meal. X 20 days starting 04/11/16    . senna (SENOKOT) 8.6 MG TABS tablet Take 1 tablet (8.6 mg total) by mouth daily. 120 each 0  . Skin Protectants, Misc. (EUCERIN) cream Apply topical to BLE and feet for dry skin twice daily and as needed.    . Travoprost, BAK Free, (TRAVATAN) 0.004 % SOLN ophthalmic solution Place 1 drop into both eyes at bedtime.    . vitamin B-12 (CYANOCOBALAMIN) 1000 MCG tablet Take one tablet by mouth once daily     No current facility-administered medications on file prior to visit.     Review of Systems   General does not complaining of any fever or chills.  Skin  does not complain of rashes or itching.  Head ears eyes nose mouth and throat does not complaining of any visual changes or sore throat.  Respiratory denies any shortness of breath or cough. Her cardiac does not clinic chest pain has baseline lower extremity edema again being treated for DVT left leg.  GI does not complaining of any nausea vomiting diarrhea constipation or abdominal discomfort.  Muscle skeletal is complaining of left arm and wrist discomfort no history of trauma.  Neurologic is not complaining of dizziness or syncope or numbness.  Psych is being treated for depression this appears to be doing better his appetite has improved.    Immunization History  Administered Date(s) Administered  . PPD Test 03/23/2016, 04/06/2016   Pertinent  Health Maintenance Due  Topic Date Due  . FOOT EXAM  04/17/2017 (Originally 12/09/1943)  . OPHTHALMOLOGY EXAM  04/17/2017 (Originally 12/09/1943)  . URINE MICROALBUMIN  04/17/2017 (Originally 12/09/1943)  . PNA vac Low Risk Adult (1 of 2 - PCV13) 04/17/2017 (Originally 12/09/2000)  . INFLUENZA VACCINE  07/07/2016  . HEMOGLOBIN A1C  10/14/2016   No flowsheet data found. Functional Status Survey:    Filed Vitals:   04/17/16 1446  BP: 139/64  Pulse: 80  Temp: 98 F (36.7 C)  TempSrc: Oral  Resp: 18  Height: 6\' 4"  (1.93 m)  Weight: 261 lb 9.6 oz (118.661 kg)   Body mass index is 31.86 kg/(m^2). Physical Exam In general this is a pleasant obese elderly male in no distress   His skin is warm and dry.  Eyes pupils appear reactive to light he does have some arcus senilis and slight proptosis bilaterally.  Oropharynx clear mucous membranes moist.  Chest is clear to auscultation there is no labored breathing.  Heart is regular rate and rhythm with distant heart sounds with a 2+ lower extremity edema bilaterally pedal pulses are palpable but reduced.  Abdomen soft nontender positive bowel sounds.  Musculoskeletal he does have some  residual edema of his right arm this actually appears somewhat improved from previous exams.  He has minimal edema of his left arm radial pulse is intact-grip strength appears to be intact there is tenderness to palpation of the wrist area and lower arm area---range of motion appears to be intact of the wrist there is positive capillary refill.--I did not note any deformities of the left wrist or left arm Neurologic is grossly intact to speech is clear no lateralizing findings.  Psych he appears alert and oriented pleasant and appropriate Labs reviewed:  Recent Labs  03/20/16 0527  03/22/16 0543  04/06/16 1300 04/10/16 0800 04/13/16 04/13/16 0545  NA 148*  < >  149*  < > 132* 135 136* 136  K 3.5  < > 4.4  < > 4.6 4.2 3.8 3.8  CL 112*  < > 113*  < > 102 104  --  103  CO2 27  < > 28  < > 22 23  --  26  GLUCOSE 194*  < > 160*  < > 305* 194*  --  133*  BUN 46*  < > 35*  < > 20 20 15 15   CREATININE 2.01*  < > 1.57*  < > 1.28* 1.33* 1.1 1.08  CALCIUM 8.7*  < > 9.0  < > 8.3* 8.4*  --  8.4*  PHOS 3.1  --  2.7  --   --   --   --   --   < > = values in this interval not displayed.  Recent Labs  02/24/16 1204 03/11/16 1854 03/16/16 1349 03/20/16 0527 03/22/16 0543  AST 21 37 44*  --   --   ALT 26 56 42  --   --   ALKPHOS 56 57 53  --   --   BILITOT 0.9 1.1 1.0  --   --   PROT 7.7 8.5* 8.4*  --   --   ALBUMIN 3.3* 3.6 3.5 2.8* 2.4*    Recent Labs  02/24/16 1204 03/11/16 1854 03/16/16 1349  03/30/16 0600 04/15/16 04/15/16 0548 04/16/16 0710  WBC 6.0 7.6 9.5  < > 8.7 7.5 7.5 8.3  NEUTROABS 4.4 5.6 7.5  --   --   --   --   --   HGB 11.7* 13.2 13.5  < > 12.8* 9.1* 9.1* 9.9*  HCT 35.7* 41.4 42.7  < > 39.1 26* 26.3* 29.2*  MCV 88.8 91.2 92.4  < > 87.7  --  84.6 84.1  PLT 160 165 141*  < > 175 215 215 284  < > = values in this interval not displayed. Lab Results  Component Value Date   TSH 2.658 03/16/2016   Lab Results  Component Value Date   HGBA1C 8.8* 04/13/2016   No  results found for: CHOL, HDL, LDLCALC, LDLDIRECT, TRIG, CHOLHDL  Significant Diagnostic Results in last 30 days:  Dg Wrist Complete Right  03/22/2016  CLINICAL DATA:  82 year old male with pain and swelling in the right wrist for the past several days. No history of injury. EXAM: RIGHT WRIST - COMPLETE 3+ VIEW COMPARISON:  None. FINDINGS: Multiple views of the right wrist demonstrate no acute displaced fracture, subluxation, dislocation, or soft tissue abnormality. IMPRESSION: No acute radiographic abnormality of the right wrist. Electronically Signed   By: Trudie Reed M.D.   On: 03/22/2016 14:47   US Venous Img Lower Bilateral  04/11/2016  CLINICAL DATA:  BILATERAL deep venous thrombosis, leg swelling, diabetes mellitus, hypertension all EXAM: BILATERAL LOWER EXTREMITY VENOUS DOPPLER ULTRASOUND TECHNIQUE: Gray-scale sonography with graded compression, as well as color Doppler and duplex ultrasound were performed to evaluate the lower extremity deep venous systems from the level of the common femoral vein and including the common femoral, femoral, profunda femoral, popliteal and calf veins including the posterior tibial, peroneal and gastrocnemius veins when visible. The superficial great saphenous vein was also interrogated. Spectral Doppler was utilized to evaluate flow at rest and with distal augmentation maneuvers in the common femoral, femoral and popliteal veins. COMPARISON:  None. FINDINGS: RIGHT LOWER EXTREMITY Common Femoral Vein: No evidence of thrombus. Normal compressibility, respiratory phasicity and response to augmentation. Saphenofemoral Junction: No evidence  of thrombus. Normal compressibility and flow on color Doppler imaging. Profunda Femoral Vein: No evidence of thrombus. Normal compressibility and flow on color Doppler imaging. Femoral Vein: No evidence of thrombus. Normal compressibility, respiratory phasicity and response to augmentation. Popliteal Vein: No evidence of thrombus.  Normal compressibility, respiratory phasicity and response to augmentation. Calf Veins: Limited visualization. No gross evidence of thrombus. Normal compressibility and flow on color Doppler imaging at the visualized vessels. Superficial Great Saphenous Vein: No evidence of thrombus. Normal compressibility and flow on color Doppler imaging. Venous Reflux:  None. Other Findings:  Subcutaneous edema RIGHT calf and ankle. LEFT LOWER EXTREMITY Common Femoral Vein: No evidence of thrombus. Normal compressibility, respiratory phasicity and response to augmentation. Saphenofemoral Junction: No evidence of thrombus. Normal compressibility and flow on color Doppler imaging. Profunda Femoral Vein: No evidence of thrombus. Normal compressibility and flow on color Doppler imaging. Femoral Vein: No evidence of thrombus. Normal compressibility, respiratory phasicity and response to augmentation. Popliteal Vein: Hypoechoic nonocclusive thrombus LEFT popliteal vein with impaired compressibility and spontaneous flow. Calf Veins: No evidence of thrombus. Normal compressibility and flow on color Doppler imaging. Superficial Great Saphenous Vein: No evidence of thrombus. Normal compressibility and flow on color Doppler imaging. Venous Reflux:  None. Other Findings:  Subcutaneous edema LEFT calf and ankle. IMPRESSION: Nonocclusive deep venous thrombosis LEFT popliteal vein. No evidence of deep venous thrombosis in RIGHT lower extremity. Subcutaneous edema at BILATERAL calves and ankles. Findings [facility] [place] on 04/11/2016 at 1720 hours. Electronically Signed   By: Ulyses SouthwardMark  Boles M.D.   On: 04/11/2016 17:21   [place] Venous Img Upper Uni Right  03/20/2016  CLINICAL DATA:  82 year old male with a history of right upper extremity edema. EXAM: RIGHT UPPER EXTREMITY VENOUS DOPPLER ULTRASOUND TECHNIQUE: Gray-scale sonography with graded compression, as well as color Doppler and duplex ultrasound were performed to evaluate the upper  extremity deep venous system from the level of the subclavian vein and including the jugular, axillary, basilic, radial, ulnar and upper cephalic vein. Spectral Doppler was utilized to evaluate flow at rest and with distal augmentation maneuvers. COMPARISON:  None. FINDINGS: Contralateral Subclavian Vein: Respiratory phasicity is normal and symmetric with the symptomatic side. No evidence of thrombus. Normal compressibility. Internal Jugular Vein: No evidence of thrombus. Normal compressibility, respiratory phasicity and response to augmentation. Subclavian Vein: No evidence of thrombus. Normal compressibility, respiratory phasicity and response to augmentation. Axillary Vein: No evidence of thrombus. Normal compressibility, respiratory phasicity and response to augmentation. Cephalic Vein: No evidence of thrombus. Normal compressibility, respiratory phasicity and response to augmentation. Basilic Vein: No evidence of thrombus. Normal compressibility, respiratory phasicity and response to augmentation. Brachial Veins: No evidence of thrombus. Normal compressibility, respiratory phasicity and response to augmentation. Radial Veins: No evidence of thrombus. Normal compressibility, respiratory phasicity and response to augmentation. Ulnar Veins: No evidence of thrombus. Normal compressibility, respiratory phasicity and response to augmentation. Other Findings:  None visualized. IMPRESSION: Sonographic survey of the right upper extremity negative for DVT. Signed, Yvone NeuJaime S. Loreta AveWagner, DO [facility] Electronically Signed   By: Gilmer MorJaime  Wagner D.O.   On: 03/20/2016 11:29    Assessment/Plan . Left wrist pain-per physical exam and with patient's history would be suspicious this is a gout attack-we will do a prednisone taper starting at 40 mg for 2 days 30 mg daily 20 mg for 2 days and 10 mg for 2 days and monitor.  Also will update a uric acid level tomorrow also  will update a metabolic panel.  We will x-ray the left arm and wrist as well to rule out any possible bony pathology.  History of left leg DVT this appears to have stabilized he is on Xarelto for anticoagulation.  #3 history of anemia hemoglobin did drop to 9.1 has rebounded up to 9.9 this is a normocytic anemia-again will update CBC tomorrow-he is on iron.  [id number]     London Sheer, CMA [phone]

## 2016-04-18 ENCOUNTER — Encounter (HOSPITAL_COMMUNITY)
Admission: RE | Admit: 2016-04-18 | Discharge: 2016-04-18 | Disposition: A | Discharge status: Home / Self Care | Payer: Medicare Other | Source: Skilled Nursing Facility | Attending: Internal Medicine | Admitting: Internal Medicine

## 2016-04-18 LAB — URIC ACID: URIC ACID, SERUM: 6.2 mg/dL (ref 4.4–7.6)

## 2016-04-18 LAB — BASIC METABOLIC PANEL
ANION GAP: 7 (ref 5–15)
BUN: 26 mg/dL — ABNORMAL HIGH (ref 6–20)
CHLORIDE: 100 mmol/L — AB (ref 101–111)
CO2: 24 mmol/L (ref 22–32)
Calcium: 8.3 mg/dL — ABNORMAL LOW (ref 8.9–10.3)
Creatinine, Ser: 1.37 mg/dL — ABNORMAL HIGH (ref 0.61–1.24)
GFR calc non Af Amer: 47 mL/min — ABNORMAL LOW (ref >60)
GFR, EST AFRICAN AMERICAN: 55 mL/min — AB (ref >60)
Glucose, Bld: 306 mg/dL — ABNORMAL HIGH (ref 65–99)
Potassium: 4.7 mmol/L (ref 3.5–5.1)
Sodium: 131 mmol/L — ABNORMAL LOW (ref 135–145)

## 2016-04-18 LAB — CBC WITH DIFFERENTIAL/PLATELET
BASOS ABS: 0 10*3/uL (ref 0.0–0.1)
BASOS PCT: 0 %
Eosinophils Absolute: 0 10*3/uL (ref 0.0–0.7)
Eosinophils Relative: 0 %
HEMATOCRIT: 27.4 % — AB (ref 39.0–52.0)
HEMOGLOBIN: 9.3 g/dL — AB (ref 13.0–17.0)
Lymphocytes Relative: 14 %
Lymphs Abs: 1.2 10*3/uL (ref 0.7–4.0)
MCH: 28.8 pg (ref 26.0–34.0)
MCHC: 33.9 g/dL (ref 30.0–36.0)
MCV: 84.8 fL (ref 78.0–100.0)
MONOS PCT: 8 %
Monocytes Absolute: 0.7 10*3/uL (ref 0.1–1.0)
NEUTROS ABS: 7.1 10*3/uL (ref 1.7–7.7)
NEUTROS PCT: 78 %
Platelets: 356 10*3/uL (ref 150–400)
RBC: 3.23 MIL/uL — AB (ref 4.22–5.81)
RDW: 15.3 % (ref 11.5–15.5)
WBC: 9 10*3/uL (ref 4.0–10.5)

## 2016-04-23 ENCOUNTER — Encounter: Payer: Self-pay | Admitting: Internal Medicine

## 2016-04-23 ENCOUNTER — Non-Acute Institutional Stay (SKILLED_NURSING_FACILITY): Payer: Medicare Other | Admitting: Internal Medicine

## 2016-04-23 DIAGNOSIS — N400 Enlarged prostate without lower urinary tract symptoms: Secondary | ICD-10-CM | POA: Diagnosis not present

## 2016-04-23 DIAGNOSIS — R609 Edema, unspecified: Secondary | ICD-10-CM

## 2016-04-23 DIAGNOSIS — I82402 Acute embolism and thrombosis of unspecified deep veins of left lower extremity: Secondary | ICD-10-CM | POA: Diagnosis not present

## 2016-04-23 DIAGNOSIS — I519 Heart disease, unspecified: Secondary | ICD-10-CM

## 2016-04-23 DIAGNOSIS — I5189 Other ill-defined heart diseases: Secondary | ICD-10-CM

## 2016-04-23 NOTE — Progress Notes (Signed)
Garrett PanderJames C Bradley male,  MRN: [id number] DOB: 06/02/1934       Facility Location: [facility] Room Number:127-W  WUJ:[id number]:[name], Chancy HurterJOHN [name], MD [address] [place] [id number]    This is a nursing facility follow up for chronic medical diagnoses  Interim medical record and care since last [facility] visit was updated with review of diagnostic studies and change in clinical status since last visit were documented.   HPI: He's had persistent edema; he does believe that this is improving since Megace was discontinued.  On 04/11/16 a nonocclusive left popliteal vein thrombosis was present. Xarelto was initiated as clot propagation in the context of signifcant peripheral edema was anticipated. He denies any bleeding dyscrasias on the Xarelto.  Echocardiogram 11/13/2013 did reveal severe LVH; moderate LAE; grade 1 diastolic dysfunction. Ejection fraction at that time was described as 55- 60% Follow-up ECHO 04/14/16 by [facility] revealed ejection fraction of 45-50 percent abnormal relaxation was present and the aortic root was dilated.    Comprehensive review of systems: Epistaxis, hemoptysis, hematuria, melena, or rectal bleeding denied. No unexplained weight loss, significant dyspepsia,dysphagia, or abdominal pain.  There is no abnormal bruising , bleeding, or difficulty stopping bleeding with injury. Cardiovascular: No chest pain, palpitations,paroxysmal nocturnal dyspnea, claudication  Respiratory: No cough, sputum production,DOE , significant snoring,apnea   Gastrointestinal: No  nausea / vomiting Genitourinary: No dysuria,hematuria, pyuria,  incontinence, nocturia Dermatologic: No rash, pruritus, change in appearance of skin Neurologic: No dizziness,headache,syncope, seizures, numbness , tingling Endocrine: No change in hair/skin/ nails, excessive thirst, excessive hunger, excessive urination  Hematologic/lymphatic: No lymphadenopathy   Physical  exam:  Pertinent or positive findings:Ptosis bilaterally, greater on the right.  Maxilla is edentulous. He has a few lower teeth which are plaque crusted.  S4 is noted Slight gynecomastia suggested. Dorsalis pedis pulses are decreased. He has compression hose in place. Foley catheter present.  Slight clubbing of the nailbeds.  General appearance:Adequately nourished; no acute distress , increased work of breathing is present.   Lymphatic: No lymphadenopathy about the head, neck, axilla . Eyes: No conjunctival inflammation or lid edema is present. There is no scleral icterus. Ears:  External ear exam shows no significant lesions or deformities.   Nose:  External nasal examination shows no deformity or inflammation. Nasal mucosa are pink and moist without lesions ,exudates Oral exam: lips and gums are healthy appearing.There is no oropharyngeal erythema or exudate . Neck:  No thyromegaly, masses, tenderness noted.    Heart:  Normal rate and regular rhythm. S1 and S2 normal without gallop, murmur, click, rub .  Lungs:Chest clear to auscultation without wheezes, rhonchi,rales , rubs. Abdomen:Bowel sounds are normal. Abdomen is soft and nontender with no organomegaly, hernias,masses. GU: deferred  Extremities:  No cyanosis  Neurologic exam : Strength equal  in upper & lower extremities Deep tendon reflexes are equal Skin: Warm & dry w/o tenting. No significant lesions or rash.    See summary under each active problem in the Problem List with associated updated therapeutic plan

## 2016-04-23 NOTE — Assessment & Plan Note (Signed)
Cardiology assessment of edema; left ventricular function; and diastolic dysfunction

## 2016-04-23 NOTE — Patient Instructions (Signed)
See orders for consultations

## 2016-04-23 NOTE — Assessment & Plan Note (Signed)
Cardiology assessment

## 2016-04-23 NOTE — Assessment & Plan Note (Signed)
Neurology consult with Dr Jerre SimonJavaid

## 2016-04-23 NOTE — Assessment & Plan Note (Signed)
Continue Xarelto 3 months and then reassess DVT risk status

## 2016-04-24 ENCOUNTER — Encounter (HOSPITAL_COMMUNITY)
Admission: AD | Admit: 2016-04-24 | Discharge: 2016-04-24 | Disposition: A | Discharge status: Home / Self Care | Payer: Medicare Other | Source: Skilled Nursing Facility | Attending: Internal Medicine | Admitting: Internal Medicine

## 2016-04-24 LAB — CBC WITH DIFFERENTIAL/PLATELET
BASOS ABS: 0 10*3/uL (ref 0.0–0.1)
BASOS PCT: 0 %
EOS ABS: 0.1 10*3/uL (ref 0.0–0.7)
EOS PCT: 1 %
HCT: 26.3 % — ABNORMAL LOW (ref 39.0–52.0)
HEMOGLOBIN: 9 g/dL — AB (ref 13.0–17.0)
LYMPHS ABS: 2.2 10*3/uL (ref 0.7–4.0)
Lymphocytes Relative: 28 %
MCH: 29.1 pg (ref 26.0–34.0)
MCHC: 34.2 g/dL (ref 30.0–36.0)
MCV: 85.1 fL (ref 78.0–100.0)
Monocytes Absolute: 0.6 10*3/uL (ref 0.1–1.0)
Monocytes Relative: 7 %
NEUTROS PCT: 64 %
Neutro Abs: 5 10*3/uL (ref 1.7–7.7)
PLATELETS: 377 10*3/uL (ref 150–400)
RBC: 3.09 MIL/uL — AB (ref 4.22–5.81)
RDW: 16.1 % — ABNORMAL HIGH (ref 11.5–15.5)
WBC: 7.8 10*3/uL (ref 4.0–10.5)

## 2016-04-24 LAB — BASIC METABOLIC PANEL
ANION GAP: 5 (ref 5–15)
BUN: 21 mg/dL — AB (ref 6–20)
CHLORIDE: 105 mmol/L (ref 101–111)
CO2: 26 mmol/L (ref 22–32)
Calcium: 8 mg/dL — ABNORMAL LOW (ref 8.9–10.3)
Creatinine, Ser: 1.09 mg/dL (ref 0.61–1.24)
Glucose, Bld: 149 mg/dL — ABNORMAL HIGH (ref 65–99)
POTASSIUM: 3.5 mmol/L (ref 3.5–5.1)
SODIUM: 136 mmol/L (ref 135–145)

## 2016-04-28 ENCOUNTER — Non-Acute Institutional Stay (SKILLED_NURSING_FACILITY): Payer: Medicare Other | Admitting: Internal Medicine

## 2016-04-28 ENCOUNTER — Encounter: Payer: Self-pay | Admitting: Internal Medicine

## 2016-04-28 DIAGNOSIS — Z8639 Personal history of other endocrine, nutritional and metabolic disease: Secondary | ICD-10-CM | POA: Diagnosis not present

## 2016-04-28 DIAGNOSIS — M25571 Pain in right ankle and joints of right foot: Secondary | ICD-10-CM

## 2016-04-28 DIAGNOSIS — E1121 Type 2 diabetes mellitus with diabetic nephropathy: Secondary | ICD-10-CM | POA: Diagnosis not present

## 2016-04-28 DIAGNOSIS — Z8739 Personal history of other diseases of the musculoskeletal system and connective tissue: Secondary | ICD-10-CM

## 2016-04-28 NOTE — Progress Notes (Signed)
Location:  [facility] Room Number: 127/W Place of Service:  SNF [phone]) Provider:  Leana Roe, MD  Patient Care Team: Sharilyn Sites, MD as PCP - [facility])  Extended Emergency Contact Information Primary Emergency Contact: [place], [place] [address] Johnnette Litter of [place] Phone: [phone] Mobile Phone: [phone] Relation: Son  Code Status:  Full Code Goals of care: Advanced Directive information Advanced Directives 04/23/2016  Does patient have an advance directive? Yes  Type of Advance Directive (No Data)  Does patient want to make changes to advanced directive? -  Copy of advanced directive(s) in chart? -     Chief Complaint  Patient presents with  . Annual Exam    Patients c/o Right ankle pain    HPI:  Pt is a 82 y.o. male seen today for an acute visit for Right ankle pain-apparentlyinitial the weekend when he went to the bathroom.  He denies any trauma or fall.  He just noted it while ambulating from the bathroom.  Says the pain has gradually eased off-she is receiving Tylenol as needed.  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  These appear to have moderated morning sugars appear to be more in the mid 100s.  Later in the day recently appears to be more mid 100s at noon as well later in the day at 4N at at bedtime appears blood sugars are more in the 200s range occasionally 300s all I suspect some of this may be  dietary related.  Currently he denies any fever or chills shortness of breath or chest painshe does have a cardiology consult pending secondary to edema.     Past Medical History  Diagnosis Date  . Hypertension   . Diabetes mellitus   . Hypothyroidism   . Depression   . Enlarged prostate     Status post TURP; self caths @ home   Past Surgical History  Procedure Laterality Date  . Hernia repair    . Transurethral resection of prostate  09/22/2011    Procedure: TRANSURETHRAL RESECTION OF THE PROSTATE (TURP);  Surgeon: Marissa Nestle;  Location: [facility];  Service: Urology;  Laterality: N/A;    Allergies  Allergen Reactions  . Thiazide-Type Diuretics     03/2016 acute wrist pain, presumed gout Uric acid 10.4    Current Outpatient Prescriptions on File Prior to Visit  Medication Sig Dispense Refill  . acetaminophen (TYLENOL) 325 MG tablet Take two tablets by mouth every 4 hours as needed for pain/temp    . ALPRAZolam (XANAX) 1 MG tablet Take 1 tablet (1 mg total) by mouth at bedtime as needed for sleep. 10 tablet 0  . citalopram (CELEXA) 20 MG tablet Take 1 tablet (20 mg total) by mouth daily.    . clobetasol ointment (TEMOVATE) 4.85 % Apply 1 application topically daily as needed. For rash on legs    . diltiazem (CARDIZEM CD) 300 MG  24 hr capsule Take 300 mg by mouth daily.     . ferrous sulfate 325 (65 FE) MG tablet Take 325 mg by mouth daily.     . fish oil-omega-3 fatty acids 1000 MG capsule Take 1 capsule (1 g total) by mouth 2 (two) times daily.    Marland Kitchen GLUCERNA (GLUCERNA) LIQD Take 237 mLs by mouth 2 (two) times daily between meals.    . insulin detemir (LEVEMIR) 100 UNIT/ML injection Inject 20 Units into the skin at bedtime.     Marland Kitchen levothyroxine (SYNTHROID, LEVOTHROID) 137 MCG tablet Take 137 mcg by mouth daily.    Marland Kitchen loperamide (IMODIUM) 2 MG capsule Take 2 tablet by mouth daily as needed    . polyethylene glycol powder (GLYCOLAX/MIRALAX) powder Take 17 g by mouth 2 (two)  times daily. Until daily soft stools  OTC 225 g 0  . Potassium Chloride ER 20 MEQ TBCR Take 1 tablet by mouth twice a day    . Rivaroxaban (XARELTO) 15 MG TABS tablet Take 15 mg by mouth 2 (two) times daily with a meal. X 20 days starting 04/11/16    . senna (SENOKOT) 8.6 MG TABS tablet Take 1 tablet (8.6 mg total) by mouth daily. 120 each 0  . Skin Protectants, Misc. (EUCERIN) cream Apply topical to BLE and feet for dry skin twice daily and as needed.    . Travoprost, BAK Free, (TRAVATAN) 0.004 % SOLN ophthalmic solution Place 1 drop into both eyes at bedtime.    . vitamin B-12 (CYANOCOBALAMIN) 1000 MCG tablet Take one tablet by mouth once daily     No current facility-administered medications on file prior to visit.     Review of Systems   In general no complaints of fever or chills says he is feeling stronger.  Skin does not complain of rashes or itching.  Head ears eyes nose mouth and throat does not complaining of any visual changes sore throat.  Respiratory denies shortness breath or cough.  Cardiac no chest pain continues with lower extremity edema he has compression hose on.  GI does not complain of abdominal discomfort nausea vomiting diarrhea or constipation.  GU he does have an indwelling Foley catheter he does have urinary consult pending.  Musculoskeletal only complaint is right ankle pain he says this has subsided somewhat as noted above.  Neurologic is not complaining of dizziness headache or increased numbness.  Psych some history of depression but this appears to be improving he has been started on Xarelto  Immunization History  Administered Date(s) Administered  . PPD Test 03/23/2016, 04/06/2016   Pertinent  Health Maintenance Due  Topic Date Due  . FOOT EXAM  04/17/2017 (Originally 12/09/1943)  . OPHTHALMOLOGY EXAM  04/17/2017 (Originally 12/09/1943)  . URINE MICROALBUMIN  04/17/2017 (Originally 12/09/1943)  . PNA vac Low Risk Adult (1 of 2 - PCV13) 04/17/2017  (Originally 12/09/2000)  . INFLUENZA VACCINE  07/07/2016  . HEMOGLOBIN A1C  10/14/2016   No flowsheet data found. Functional Status Survey:    Filed Vitals:   04/28/16 1145  BP: 142/75  Pulse: 82  Temp: 98.8 F (37.1 C)  TempSrc: Oral  Resp: 18  Height: _0  (1.93 m)  Weight: 252 lb (114.306 kg)   Body mass index is 30.69 kg/(m^2). Physical Exam  In general this is a pleasant elderly male in no distress lying comfortably in bed.  His skin is warm and dry.  Oropharynx clear he has numerous extractions.  Chest is clear to auscultation  there is no labored breathing.  Heart is regular rate and rhythm without murmur gallop or rub he continues with I would say 2+ lower extremity edema compression hose in place.  Abdomen is obese soft nontender positive bowel sounds.  Muscle skeletal does move all extremities 4 has gained strength with ambulation-right ankle I did not note any deformity there is some mild tenderness to palpation of the ankle area I do not really note any increased edema or erythema of the area with flexion and extension there is some mild discomfort but he does not appear to have acute pain with this I do not note any swelling.  In regards to the rest of the foot I do not see evidence of erythema or increased edema from baseline.  Neurologic is grossly intact strength appears intact upper and lower extremities cranial nerves intact speech is clear.  Psych he is alert and oriented pleasant and appropriate more talkative than when I initially met him   Labs reviewed:  Recent Labs  03/20/16 0527  03/22/16 0543  04/13/16 0545 04/18/16 1400 04/24/16 0630  NA 148*  < > 149*  < > 136 131* 136  K 3.5  < > 4.4  < > 3.8 4.7 3.5  CL 112*  < > 113*  < > 103 100* 105  CO2 27  < > 28  < > _0 GLUCOSE 194*  < > 160*  < > 133* 306* 149*  BUN 46*  < > 35*  < > 15 26* 21*  CREATININE 2.01*  < > 1.57*  < > 1.08 1.37* 1.09  CALCIUM 8.7*  < > 9.0  < > 8.4* 8.3*  8.0*  PHOS 3.1  --  2.7  --   --   --   --   < > = values in this interval not displayed.  Recent Labs  02/24/16 1204 03/11/16 1854 03/16/16 1349 03/20/16 0527 03/22/16 0543  AST 21 37 44*  --   --   ALT 26 56 42  --   --   ALKPHOS 56 57 53  --   --   BILITOT 0.9 1.1 1.0  --   --   PROT 7.7 8.5* 8.4*  --   --   ALBUMIN 3.3* 3.6 3.5 2.8* 2.4*    Recent Labs  03/16/16 1349  04/16/16 0710 04/18/16 1400 04/24/16 0630  WBC 9.5  < > 8.3 9.0 7.8  NEUTROABS 7.5  --   --  7.1 5.0  HGB 13.5  < > 9.9* 9.3* 9.0*  HCT 42.7  < > 29.2* 27.4* 26.3*  MCV 92.4  < > 84.1 84.8 85.1  PLT 141*  < > 284 356 377  < > = values in this interval not displayed. Lab Results  Component Value Date   TSH 2.658 03/16/2016   Lab Results  Component Value Date   HGBA1C 8.8* 04/13/2016   No results found for: CHOL, HDL, LDLCALC, LDLDIRECT, TRIG, CHOLHDL  Significant Diagnostic Results in last 30 days:  US Venous Img Lower Bilateral  04/11/2016  CLINICAL DATA:  BILATERAL deep venous thrombosis, leg swelling, diabetes mellitus, hypertension all EXAM: BILATERAL LOWER EXTREMITY VENOUS DOPPLER ULTRASOUND TECHNIQUE: Gray-scale sonography with graded compression, as well as color Doppler and duplex ultrasound were performed to evaluate the lower extremity deep venous systems from the level of the common femoral vein and including the common femoral, femoral, profunda femoral, popliteal and calf veins including the posterior tibial, peroneal  and gastrocnemius veins when visible. The superficial great saphenous vein was also interrogated. Spectral Doppler was utilized to evaluate flow at rest and with distal augmentation maneuvers in the common femoral, femoral and popliteal veins. COMPARISON:  None. FINDINGS: RIGHT LOWER EXTREMITY Common Femoral Vein: No evidence of thrombus. Normal compressibility, respiratory phasicity and response to augmentation. Saphenofemoral Junction: No evidence of thrombus. Normal  compressibility and flow on color Doppler imaging. Profunda Femoral Vein: No evidence of thrombus. Normal compressibility and flow on color Doppler imaging. Femoral Vein: No evidence of thrombus. Normal compressibility, respiratory phasicity and response to augmentation. Popliteal Vein: No evidence of thrombus. Normal compressibility, respiratory phasicity and response to augmentation. Calf Veins: Limited visualization. No gross evidence of thrombus. Normal compressibility and flow on color Doppler imaging at the visualized vessels. Superficial Great Saphenous Vein: No evidence of thrombus. Normal compressibility and flow on color Doppler imaging. Venous Reflux:  None. Other Findings:  Subcutaneous edema RIGHT calf and ankle. LEFT LOWER EXTREMITY Common Femoral Vein: No evidence of thrombus. Normal compressibility, respiratory phasicity and response to augmentation. Saphenofemoral Junction: No evidence of thrombus. Normal compressibility and flow on color Doppler imaging. Profunda Femoral Vein: No evidence of thrombus. Normal compressibility and flow on color Doppler imaging. Femoral Vein: No evidence of thrombus. Normal compressibility, respiratory phasicity and response to augmentation. Popliteal Vein: Hypoechoic nonocclusive thrombus LEFT popliteal vein with impaired compressibility and spontaneous flow. Calf Veins: No evidence of thrombus. Normal compressibility and flow on color Doppler imaging. Superficial Great Saphenous Vein: No evidence of thrombus. Normal compressibility and flow on color Doppler imaging. Venous Reflux:  None. Other Findings:  Subcutaneous edema LEFT calf and ankle. IMPRESSION: Nonocclusive deep venous thrombosis LEFT popliteal vein. No evidence of deep venous thrombosis in RIGHT lower extremity. Subcutaneous edema at BILATERAL calves and ankles. Findings called to [facility] RN at [facility] on 04/11/2016 at 1720 hours. Electronically Signed   By: Lavonia Dana M.D.   On: 04/11/2016 17:21     Assessment/Plan  #1 right ankle pain-at this point does not appear to be in acute pain apparently did have some discomfort ambulating to the bathroom recently-still is having some discomfort-Tylenol appears to be helping with the pain-will order an x-ray of the area to further assess-with his history gout would also be in the back of one small will order a uric acid level as well although this does not really give a gout-like presentation per physical exam at this point will monitor.  #2-history of diabetes type 2--Dr. Linna Darner recently did increase his Levemir to 20 units daily-blood sugars appear to be improved I suspect this may be due to the increased dose Levemir as well as being off the prednisone still has some elevated readings later in the day he is on Humalog as well as 5 units if CBG is greater than 300-at this point will monitor sugars this morning was 113 at noon was 183 --sugars later in the day especially at bedtime appear to be somewhat more elevated but these come down overnight would be hesitant to be too aggressive here with risk of hypoglycemia-at this point monitor he  Hemoglobin A1c on May 8 was 8.8 again Dr. Linna Darner did increase his Levemir and this appears to have had a beneficial effect I suspect the prednisone may have been contributed this previously as well  [place], [place], [place]

## 2016-04-29 ENCOUNTER — Encounter (HOSPITAL_COMMUNITY)
Admission: RE | Admit: 2016-04-29 | Discharge: 2016-04-29 | Disposition: A | Discharge status: Home / Self Care | Payer: Medicare Other | Source: Skilled Nursing Facility | Attending: Internal Medicine | Admitting: Internal Medicine

## 2016-04-29 LAB — URIC ACID: Uric Acid, Serum: 5.5 mg/dL (ref 4.4–7.6)

## 2016-04-30 ENCOUNTER — Ambulatory Visit (INDEPENDENT_AMBULATORY_CARE_PROVIDER_SITE_OTHER): Payer: Medicare Other | Admitting: Cardiovascular Disease

## 2016-04-30 ENCOUNTER — Encounter: Payer: Self-pay | Admitting: Cardiovascular Disease

## 2016-04-30 VITALS — BP 160/76 | HR 70 | Ht 76.0 in | Wt 248.0 lb

## 2016-04-30 DIAGNOSIS — R600 Localized edema: Secondary | ICD-10-CM | POA: Diagnosis not present

## 2016-04-30 DIAGNOSIS — I10 Essential (primary) hypertension: Secondary | ICD-10-CM

## 2016-04-30 DIAGNOSIS — I77810 Thoracic aortic ectasia: Secondary | ICD-10-CM

## 2016-04-30 DIAGNOSIS — I519 Heart disease, unspecified: Secondary | ICD-10-CM

## 2016-04-30 DIAGNOSIS — I429 Cardiomyopathy, unspecified: Secondary | ICD-10-CM

## 2016-04-30 MED ORDER — FUROSEMIDE 20 MG PO TABS: ORAL_TABLET | ORAL | Status: DC

## 2016-04-30 NOTE — Progress Notes (Signed)
Patient ID: Garrett Bradley, male   DOB: 08-11-1934, 82 y.o.   MRN: [id number]       CARDIOLOGY CONSULT NOTE  Patient ID: Garrett Bradley MRN: [id number] DOB/AGE: [birth year] 82 y.o.  Admit date: (Not on file) Primary Physician: Colette Ribas, MD Referring Physician: Alwyn Ren MD  Reason for Consultation: edema, diastolic dysfunction, cardiomyopathy  HPI: The patient is an 82 year old male with a past medical history significant for edema and nonocclusive left popliteal vein thrombosis for which he takes Xarelto. Ultrasonography demonstrated subcutaneous edema in the bilateral calves and ankles.  Echocardiogram 11/13/13 demonstrated normal left ventricular systolic function with severe LVH, LVEF 55-60%, normal regional wall motion, grade 1 diastolic dysfunction with elevated filling pressures, and mildly dilated aortic root and ascending aorta.  He reportedly had a follow-up echo on 04/14/16 by a company called [facility] which reportedly revealed an ejection fraction of 45-50% with abnormal relaxation and aortic root dilatation. I do not have access to these images.  Labs 04/24/16 showed BUN 21, creatinine 1.09, sodium 136, potassium 3.5, hemoglobin 9, platelets 377.  ECG March 2017 which I personally interpreted demonstrated sinus rhythm with right bundle-branch block, left anterior fascicular block, and frequent PVCs.  He describes leg swelling over the past 4-6 weeks. He denies chest pain, orthopnea, shortness of breath, and palpitations. He has occasional dizziness when going from the sitting to standing position after long periods of lying down.  He tells me his PCP previously prescribed Lasix. He is currently wearing compression stockings.     Allergies  Allergen Reactions  . Thiazide-Type Diuretics     03/2016 acute wrist pain, presumed gout Uric acid 10.4    No current outpatient prescriptions on file.   No current facility-administered medications for this  visit.    Past Medical History  Diagnosis Date  . Hypertension   . Diabetes mellitus   . Hypothyroidism   . Depression   . Enlarged prostate     Status post TURP; self caths @ home    Past Surgical History  Procedure Laterality Date  . Hernia repair    . Transurethral resection of prostate  09/22/2011    Procedure: TRANSURETHRAL RESECTION OF THE PROSTATE (TURP);  Surgeon: Ky Barban;  Location: [facility];  Service: Urology;  Laterality: N/A;    Social History   Social History  . Marital Status: Legally Separated    Spouse Name: N/A  . Number of Children: N/A  . Years of Education: N/A   Occupational History  . Not on file.   Social History Main Topics  . Smoking status: Never Smoker   . Smokeless tobacco: Never Used  . Alcohol Use: No  . Drug Use: No  . Sexual Activity: Not Currently   Other Topics Concern  . Not on file   Social History Narrative     No family history of premature CAD in 1st degree relatives.  Prior to Admission medications   Medication Sig Start Date End Date Taking? Authorizing Provider  ALPRAZolam Prudy Feeler) 1 MG tablet Take 1 tablet (1 mg total) by mouth at bedtime as needed for sleep. 03/23/16  Yes Standley Brooking, MD  citalopram (CELEXA) 20 MG tablet Take 1 tablet (20 mg total) by mouth daily. 03/23/16  Yes Standley Brooking, MD  clobetasol ointment (TEMOVATE) 0.05 % Apply 1 application topically daily as needed. For rash on legs 02/09/14  Yes Historical Provider, MD  diltiazem (CARDIZEM CD) 300 MG 24 hr capsule Take  300 mg by mouth daily.    Yes Historical Provider, MD  ferrous sulfate 325 (65 FE) MG tablet Take 325 mg by mouth daily.  10/17/11  Yes Vassie Loll, MD  fish oil-omega-3 fatty acids 1000 MG capsule Take 1 capsule (1 g total) by mouth 2 (two) times daily. 10/02/11  Yes Christiane Ha, MD  GLUCERNA (GLUCERNA) LIQD Take 237 mLs by mouth 2 (two) times daily between meals.   Yes Historical Provider, MD  insulin detemir  (LEVEMIR) 100 UNIT/ML injection Inject 20 Units into the skin at bedtime.    Yes Historical Provider, MD  levothyroxine (SYNTHROID, LEVOTHROID) 137 MCG tablet Take 137 mcg by mouth daily. 01/30/16  Yes Historical Provider, MD  loperamide (IMODIUM) 2 MG capsule Take 2 tablet by mouth daily as needed   Yes Historical Provider, MD  megestrol (MEGACE) 400 MG/10ML suspension Take 40 mg by mouth daily.   Yes Historical Provider, MD  Potassium Chloride ER 20 MEQ TBCR Take 1 tablet by mouth twice a day   Yes Historical Provider, MD  Rivaroxaban (XARELTO) 15 MG TABS tablet Take 15 mg by mouth 2 (two) times daily with a meal. X 20 days starting 04/11/16   Yes Historical Provider, MD  senna (SENOKOT) 8.6 MG TABS tablet Take 1 tablet (8.6 mg total) by mouth daily. 03/11/16  Yes Lavera Guise, MD  Skin Protectants, Misc. (EUCERIN) cream Apply topical to BLE and feet for dry skin twice daily and as needed.   Yes Historical Provider, MD  Travoprost, BAK Free, (TRAVATAN) 0.004 % SOLN ophthalmic solution Place 1 drop into both eyes at bedtime.   Yes Historical Provider, MD  vitamin B-12 (CYANOCOBALAMIN) 1000 MCG tablet Take one tablet by mouth once daily   Yes Historical Provider, MD  acetaminophen (TYLENOL) 325 MG tablet Take two tablets by mouth every 4 hours as needed for pain/temp    Historical Provider, MD     Review of systems complete and found to be negative unless listed above in HPI     Physical exam Blood pressure 160/76, pulse 70, height 6\' 4"  (1.93 m), weight 248 lb (112.492 kg), SpO2 97 %. General: NAD Neck: No JVD, no thyromegaly or thyroid nodule.  Lungs: Clear to auscultation bilaterally with normal respiratory effort. CV: Nondisplaced PMI. Regular rate and rhythm with premature contractions, normal S1/S2, no S3/S4, no murmur.  Trace pretibial and 1+ perinakle edema.     Abdomen: Soft, nontender, no distention.  Skin: Intact without lesions or rashes.  Neurologic: Alert and oriented.  Psych:  Normal affect. Extremities: B/l finger clubbing.  HEENT: Normal.   ECG: Most recent ECG reviewed.  Labs:   Lab Results  Component Value Date   WBC 7.8 04/24/2016   HGB 9.0* 04/24/2016   HCT 26.3* 04/24/2016   MCV 85.1 04/24/2016   PLT 377 04/24/2016    Recent Labs Lab 04/24/16 0630  NA 136  K 3.5  CL 105  CO2 26  BUN 21*  CREATININE 1.09  CALCIUM 8.0*  GLUCOSE 149*   Lab Results  Component Value Date   CKTOTAL 14 10/20/2011   CKMB 1.8 10/20/2011   TROPONINI <0.30 07/27/2014   No results found for: CHOL No results found for: HDL No results found for: LDLCALC No results found for: TRIG No results found for: CHOLHDL No results found for: LDLDIRECT       Studies: No results found.  ASSESSMENT AND PLAN:  1. Bilateral leg edema: I would continue to use  compression stockings and I will prescribe Lasix 20 mg as needed. This very well may be due to diastolic dysfunction but I am uncertain of what grade. I will repeat a 2-D echocardiogram with Doppler and possibly use contrast enhancement if needed to more accurately define diastolic parameters. Would aim to control BP. May need additional antihypertensive therapy.  2. Cardiomyopathy, EF 45-50%: I will hold off on stress testing. He denies symptoms of chest pain and shortness of breath.  I will repeat a 2-D echocardiogram with Doppler and possibly use contrast enhancement if needed to more accurately define diastolic parameters as well as LVEF and regional wall motion.  3. Aortic root dilatation: Will reassess with echocardi[facility].  4. Essential HTN: Elevated today. May need additional antihypertensive therapy.  Dispo: fu 2 months.   Signed: Prentice DockerSuresh Koneswaran, M.D., F.A.C.C.  04/30/2016, 9:11 AM

## 2016-04-30 NOTE — Patient Instructions (Signed)
Medication Instructions:  START LASIX 20 MG DAILY AS NEEDED FOR LEG SWELLING   Labwork: NONE  Testing/Procedures: Your physician has requested that you have an echocardiogram. Echocardiography is a painless test that uses sound waves to create images of your heart. It provides your doctor with information about the size and shape of your heart and how well your heart's chambers and valves are working. This procedure takes approximately one hour. There are no restrictions for this procedure.    Follow-Up: Your physician recommends that you schedule a follow-up appointment in: 2 MONTHS   Any Other Special Instructions Will Be Listed Below (If Applicable).     If you need a refill on your cardiac medications before your next appointment, please call your pharmacy.

## 2016-05-01 ENCOUNTER — Encounter (HOSPITAL_COMMUNITY)
Admission: RE | Admit: 2016-05-01 | Discharge: 2016-05-01 | Disposition: A | Discharge status: Home / Self Care | Payer: Medicare Other | Source: Skilled Nursing Facility | Attending: Internal Medicine | Admitting: Internal Medicine

## 2016-05-01 LAB — CBC
HEMATOCRIT: 29.6 % — AB (ref 39.0–52.0)
HEMOGLOBIN: 9.7 g/dL — AB (ref 13.0–17.0)
MCH: 28 pg (ref 26.0–34.0)
MCHC: 32.8 g/dL (ref 30.0–36.0)
MCV: 85.5 fL (ref 78.0–100.0)
Platelets: 328 10*3/uL (ref 150–400)
RBC: 3.46 MIL/uL — AB (ref 4.22–5.81)
RDW: 16.3 % — AB (ref 11.5–15.5)
WBC: 8.2 10*3/uL (ref 4.0–10.5)

## 2016-05-06 ENCOUNTER — Inpatient Hospital Stay (HOSPITAL_COMMUNITY)
Admit: 2016-05-06 | Discharge: 2016-05-06 | Disposition: A | Discharge status: Home / Self Care | Payer: Medicare Other | Attending: Cardiovascular Disease | Admitting: Cardiovascular Disease

## 2016-05-06 ENCOUNTER — Encounter: Payer: Self-pay | Admitting: Internal Medicine

## 2016-05-06 ENCOUNTER — Non-Acute Institutional Stay (SKILLED_NURSING_FACILITY): Payer: Medicare Other | Admitting: Internal Medicine

## 2016-05-06 ENCOUNTER — Telehealth: Payer: Self-pay | Admitting: *Deleted

## 2016-05-06 DIAGNOSIS — R600 Localized edema: Secondary | ICD-10-CM | POA: Diagnosis not present

## 2016-05-06 DIAGNOSIS — I519 Heart disease, unspecified: Secondary | ICD-10-CM | POA: Diagnosis not present

## 2016-05-06 DIAGNOSIS — R531 Weakness: Secondary | ICD-10-CM

## 2016-05-06 DIAGNOSIS — I82402 Acute embolism and thrombosis of unspecified deep veins of left lower extremity: Secondary | ICD-10-CM | POA: Diagnosis not present

## 2016-05-06 DIAGNOSIS — E118 Type 2 diabetes mellitus with unspecified complications: Secondary | ICD-10-CM

## 2016-05-06 DIAGNOSIS — I5189 Other ill-defined heart diseases: Secondary | ICD-10-CM

## 2016-05-06 LAB — ECHOCARDIOGRAM COMPLETE
Height: 76 in
Weight: 3968 oz

## 2016-05-06 NOTE — Telephone Encounter (Signed)
-----   Message from Laqueta LindenSuresh A Koneswaran, MD sent at 05/06/2016  3:06 PM EDT ----- Normal pumping function.

## 2016-05-06 NOTE — Telephone Encounter (Signed)
Called patient with test results. No answer. Left message to call back.  

## 2016-05-06 NOTE — Progress Notes (Signed)
Location:  [facility] Room Number: 127/W Place of Service:  SNF [phone] Provider:  Georgeanna LeaArlo Daishia Bradley  Garrett Bradley, Garrett [name], MD  Patient Care Team: Garrett FoundJohn [name], MD as PCP - General [facility](Family Medicine)  Extended Emergency Contact Information Primary Emergency Contact: StearnsWithers,Garrett          [place], [place] [address] [place] Home Phone: [phone] Mobile Phone: [phone] Relation: Son  Code Status:  Full Code Goals of care: Advanced Directive information Advanced Directives 05/06/2016  Does patient have an advance directive? Yes  Type of Advance Directive -  Does patient want to make changes to advanced directive? No - Patient declined  Copy of advanced directive(s) in chart? Yes     Chief Complaint  Patient presents with  . Discharge Note    HPI:  Pt is a 82 y.o. male seen today for Discharge from facility-patient originally came here for rehabilitation after hospitalization for weakness and failure to thrive-with acute kidney injury on top of chronic kidney disease.  He had hypernatremia secondary to dehydration and poor oral intake.  Also was treated for UTI.  He also had a gout flare right upper extremity that was treated with prednisone-also had significant depression he is on Celexa.  Patient's clinical status has improved according nursing has he eaten better initially gained a significant amount of weight but this appears to have moderated although we have quite a bit of scale variation actually appears he's lost about 23 pounds since beginning of the month although when he came here he had significant weight gain-somewhat this may be fluid related he does have a history of diastolic dysfunction is now on when necessary Lasix per cardiology.  In fact he had a cardiac echo today which showed a normal left ventricular ejection fraction of 55-60%-with grade 1 diastolic dysfunction with orders for when necessary Lasix.  He has gained  strength is now ambulating with a walker he has no acute complaints.  He has had some suspicions of Gout l flares during his stay here and at one point did receive another prednisone course.  Most recently complained of right ankle discomfort but he says this is improved as well.  Does have a history of anemia but hemoglobin appears improved at 9.7 on lab done on May 26 he is on iron.  He also is on Xarelto was a history of left leg DVT diagnosed earlier this month-recommendation to continue this for 3 month course and then await reevaluation the edema of the leg has improved IHe has completed the  initial course of 15 mg twice a day for 20 days---this will need to continue at the maintenance level of 20 mg a day and will write an order to start this.  He is a type II diabetic and is on Levemir 20 units daily at bedtime CBGs initially were elevated but these appear to have improved with morning CBGs more in the mid 100 range-this appears to be the case more at noon as well with occasional readings above 200 but these do not appear consistent-more the higher 100s to lower 200s at 4:30 and does have frequent readings in the 200s range at at bedtime-since he is about to go home will not be aggressive changing medication for fears of hypoglycemia but will need follow-up by primary care provider  He also has a history of bladder with obstruction with a chronic indwelling Foley catheter-he will need urology follow-up as an outpatient.      Past  Medical History  Diagnosis Date  . Hypertension   . Diabetes mellitus   . Hypothyroidism   . Depression   . Enlarged prostate     Status post TURP; self caths @ home   Past Surgical History  Procedure Laterality Date  . Hernia repair    . Transurethral resection of prostate  09/22/2011    Procedure: TRANSURETHRAL RESECTION OF THE PROSTATE (TURP);  Surgeon: Ky Barban;  Location: [facility];  Service: Urology;  Laterality: N/A;    Allergies    Allergen Reactions  . Thiazide-Type Diuretics     03/2016 acute wrist pain, presumed gout Uric acid 10.4    Current Outpatient Prescriptions on File Prior to Visit  Medication Sig Dispense Refill  . acetaminophen (TYLENOL) 325 MG tablet Take two tablets by mouth every 4 hours as needed for pain/temp    . ALPRAZolam (XANAX) 1 MG tablet Take 1 tablet (1 mg total) by mouth at bedtime as needed for sleep. 10 tablet 0  . citalopram (CELEXA) 20 MG tablet Take 1 tablet (20 mg total) by mouth daily.    . clobetasol ointment (TEMOVATE) 0.05 % Apply 1 application topically daily as needed. For rash on legs    . diltiazem (CARDIZEM CD) 300 MG 24 hr capsule Take 300 mg by mouth daily.     . ferrous sulfate 325 (65 FE) MG tablet Take 325 mg by mouth daily.     . fish oil-omega-3 fatty acids 1000 MG capsule Take 1 capsule (1 g total) by mouth 2 (two) times daily.    . furosemide (LASIX) 20 MG tablet TAKE 20 MG DAILY AS NEEDED FOR LEG SWELLING. 90 tablet 3  . GLUCERNA (GLUCERNA) LIQD Take 237 mLs by mouth 2 (two) times daily between meals.    . insulin detemir (LEVEMIR) 100 UNIT/ML injection Inject 20 Units into the skin at bedtime.     Marland Kitchen levothyroxine (SYNTHROID, LEVOTHROID) 137 MCG tablet Take 137 mcg by mouth daily.    Marland Kitchen loperamide (IMODIUM) 2 MG capsule Take 2 tablet by mouth daily as needed    . Potassium Chloride ER 20 MEQ TBCR Take 1 tablet by mouth twice a day    . senna (SENOKOT) 8.6 MG TABS tablet Take 1 tablet (8.6 mg total) by mouth daily. 120 each 0  . Skin Protectants, Misc. (EUCERIN) cream Apply topical to BLE and feet for dry skin twice daily and as needed.    . Travoprost, BAK Free, (TRAVATAN) 0.004 % SOLN ophthalmic solution Place 1 drop into both eyes at bedtime.    . vitamin B-12 (CYANOCOBALAMIN) 1000 MCG tablet Take one tablet by mouth once daily     No current facility-administered medications on file prior to visit.     Review of Systems   In general does not complain of  fever or chills says he feels stronger.  Skin does not complain of rashes or itching.  Head ears eyes nose mouth and throat (visual changes or sore throat.  Respiratory denies shortness of breath or cough.  Cardiac does not complain of chest pain continues with lower extremity edema that appears to have stabilized somewhat he does have her was on by mouth pulses are somewhat reduced bilaterally.  GU is not complaining of dysuria continues with indwelling Foley catheter with a history of BPH urinary outlet obstruction.  [email or web address] of any abdominal discomfort nausea or vomiting diarrhea or constipation.  Muscle skeletal does not complain of joint pain currently has had gout flares  in the past as noted above.  Neurologic is not complaining of dizziness headache or overt numbness at this time.  Psych history of depression and anxiety this appears to be stabilized more interactive and talkative looking forward to going home   Immunization History  Administered Date(s) Administered  . PPD Test 03/23/2016, 04/06/2016   Pertinent  Health Maintenance Due  Topic Date Due  . FOOT EXAM  04/17/2017 (Originally 12/09/1943)  . OPHTHALMOLOGY EXAM  04/17/2017 (Originally 12/09/1943)  . URINE MICROALBUMIN  04/17/2017 (Originally 12/09/1943)  . PNA vac Low Risk Adult (1 of 2 - PCV13) 04/17/2017 (Originally 12/09/1998)  . INFLUENZA VACCINE  07/07/2016  . HEMOGLOBIN A1C  10/14/2016   No flowsheet data Bradley. Functional Status Survey:    Filed Vitals:   05/06/16 1428  BP: 142/75  Pulse: 82  Temp: 98.1 F (36.7 C)  TempSrc: Oral  Resp: 18  Height: 6\' 4"  (1.93 m)  Weight: 247 lb 3.2 oz (112.129 kg)   Body mass index is 30.1 kg/(m^2). Physical Exam In general this is a pleasant elderly male in no distress.  His skin is warm and dry.  Eyes he does have arcus in now is slight ptosis bilaterally visual acuity appears preserved  Oropharynx is clear mucous membranes moist he has numerous  extractions.  Chest is clear to auscultation with somewhat shallow air entry there is no labored breathing.  Heart is regular rate and rhythm without murmur gallop or rub does continue with his chronic lower extremity edema venous stasis changes I would say 1-2+ although this appears to be stable with previous exams pedal pulses are somewhat reduced he does have Cozaar.  Abdomen is obese soft nontender positive bowel sounds with ventral hernias bowel sounds again are positive.  GU has an indwelling Foley catheter draining amber colored urine.  Muscle skeletal continues with some lower extremity weakness but is ambulatory with his walker I do not note any deformities other than arthritic.  The edema of his right arm appears significant improved from when he first came to the facility strength appears preserved upper extremities bilaterally.  Neurologic is grossly intact to speech is clear no lateralizing findings.  Psych he is alert and oriented pleasant and appropriate.   Labs reviewed:  05/01/2016.  WBC 8.2 hemoglobin 9.7 platelets 328.  Apr 29 2016  Uric acid 5.5.  04/24/2016.  Sodium 136 potassium 3.5 BUN 21 creatinine 1.09.      Recent Labs  03/20/16 0527  03/22/16 0543  04/13/16 0545 04/18/16 1400 04/24/16 0630  NA 148*  < > 149*  < > 136 131* 136  K 3.5  < > 4.4  < > 3.8 4.7 3.5  CL 112*  < > 113*  < > 103 100* 105  CO2 27  < > 28  < > 26 24 26   GLUCOSE 194*  < > 160*  < > 133* 306* 149*  BUN 46*  < > 35*  < > 15 26* 21*  CREATININE 2.01*  < > 1.57*  < > 1.08 1.37* 1.09  CALCIUM 8.7*  < > 9.0  < > 8.4* 8.3* 8.0*  PHOS 3.1  --  2.7  --   --   --   --   < > = values in this interval not displayed.  Recent Labs  02/24/16 1204 03/11/16 1854 03/16/16 1349 03/20/16 0527 03/22/16 0543  AST 21 37 44*  --   --   ALT 26 56 42  --   --  ALKPHOS 56 57 53  --   --   BILITOT 0.9 1.1 1.0  --   --   PROT 7.7 8.5* 8.4*  --   --   ALBUMIN 3.3* 3.6 3.5 2.8* 2.4*     Recent Labs  03/16/16 1349  04/18/16 1400 04/24/16 0630 05/01/16 0730  WBC 9.5  < > 9.0 7.8 8.2  NEUTROABS 7.5  --  7.1 5.0  --   HGB 13.5  < > 9.3* 9.0* 9.7*  HCT 42.7  < > 27.4* 26.3* 29.6*  MCV 92.4  < > 84.8 85.1 85.5  PLT 141*  < > 356 377 328  < > = values in this interval not displayed. Lab Results  Component Value Date   TSH 2.658 03/16/2016   Lab Results  Component Value Date   HGBA1C 8.8* 04/13/2016   No results Bradley for: CHOL, HDL, LDLCALC, LDLDIRECT, TRIG, CHOLHDL  Significant Diagnostic Results in last 30 days:  US Venous Img Lower Bilateral  04/11/2016  CLINICAL DATA:  BILATERAL deep venous thrombosis, leg swelling, diabetes mellitus, hypertension all EXAM: BILATERAL LOWER EXTREMITY VENOUS DOPPLER ULTRASOUND TECHNIQUE: Gray-scale sonography with graded compression, as well as color Doppler and duplex ultrasound were performed to evaluate the lower extremity deep venous systems from the level of the common femoral vein and including the common femoral, femoral, profunda femoral, popliteal and calf veins including the posterior tibial, peroneal and gastrocnemius veins when visible. The superficial great saphenous vein was also interrogated. Spectral Doppler was utilized to evaluate flow at rest and with distal augmentation maneuvers in the common femoral, femoral and popliteal veins. COMPARISON:  None. FINDINGS: RIGHT LOWER EXTREMITY Common Femoral Vein: No evidence of thrombus. Normal compressibility, respiratory phasicity and response to augmentation. Saphenofemoral Junction: No evidence of thrombus. Normal compressibility and flow on color Doppler imaging. Profunda Femoral Vein: No evidence of thrombus. Normal compressibility and flow on color Doppler imaging. Femoral Vein: No evidence of thrombus. Normal compressibility, respiratory phasicity and response to augmentation. Popliteal Vein: No evidence of thrombus. Normal compressibility, respiratory phasicity and response  to augmentation. Calf Veins: Limited visualization. No gross evidence of thrombus. Normal compressibility and flow on color Doppler imaging at the visualized vessels. Superficial Great Saphenous Vein: No evidence of thrombus. Normal compressibility and flow on color Doppler imaging. Venous Reflux:  None. Other Findings:  Subcutaneous edema RIGHT calf and ankle. LEFT LOWER EXTREMITY Common Femoral Vein: No evidence of thrombus. Normal compressibility, respiratory phasicity and response to augmentation. Saphenofemoral Junction: No evidence of thrombus. Normal compressibility and flow on color Doppler imaging. Profunda Femoral Vein: No evidence of thrombus. Normal compressibility and flow on color Doppler imaging. Femoral Vein: No evidence of thrombus. Normal compressibility, respiratory phasicity and response to augmentation. Popliteal Vein: Hypoechoic nonocclusive thrombus LEFT popliteal vein with impaired compressibility and spontaneous flow. Calf Veins: No evidence of thrombus. Normal compressibility and flow on color Doppler imaging. Superficial Great Saphenous Vein: No evidence of thrombus. Normal compressibility and flow on color Doppler imaging. Venous Reflux:  None. Other Findings:  Subcutaneous edema LEFT calf and ankle. IMPRESSION: Nonocclusive deep venous thrombosis LEFT popliteal vein. No evidence of deep venous thrombosis in RIGHT lower extremity. Subcutaneous edema at BILATERAL calves and ankles. Findings called to [facility] RN at [facility] on 04/11/2016 at 1720 hours. Electronically Signed   By: Ulyses Southward M.D.   On: 04/11/2016 17:21    Assessment/Plan  #1 history of weakness with failure to thrive-he has gained strength-he will be going home with family-he is  looking forward to this-continues on Celexa which appears to be helping with his depression.  He had been eating better  but he's had some weight loss over the past month which causes some concern and this will need follow-up by primary care  provider initially had significant weight gain and appetite improved significantly.--I suspect some of this weight loss this edema related  At one point he was on Megace this was discontinued and would be a questionable choice with his history of DVT  #2 history of left leg DVT-he will need to continue Xarelto to complete a three-month course this will need reevaluation approximately 2 months.  #3-history diabetes type 2 as noted above sugars appear to be somewhat improved he is on Levemir 20 units daily at bedtime-will defer follow-up to primary care provider.  #4-history of BPH with urinary obstruction-he will need urology follow-up as an outpatient this is been relatively stable with his catheter during his stay here.  #5-anemia-suspect there is an element of chronic disease here he is on iron this appears to be stable with a hemoglobin of 9.7 on lab done on May 26 at this point will defer follow-up to primary care provider.  #6-history hypothyroidism he continues on Synthroid--TSH was 2.658 on 03/16/2016 which is within normal range.  #7 history of diastolic CHF-again echo results as noted above she does have rate 1 diastolic dysfunction with termination by cardiology for Lasix as needed-.  #8 hypertension-he appears to have variable blood pressures ranging from 142/75-125/74-134/67-172/83-I do not see consistent elevations with this will need monitoring-cardiology is also noted this-he is on diltiazem extended release 300 mg daily.  #9-I do note he is on potassium supplementation potassium was 3.5 on May 19 he does have an order for Lasix when necessary this will need updated before discharge will update this tomorrow.  Patient will need continued PT and OT for strengthening as well as nursing support for his multiple medical issues including diabetes failure to thrive as well as catheter maintenance.  Again he does have urology follow up scheduledshe will also need a rolling walker to  help with his ambulation.  [id number] note greater than 30 minutes spent on this discharge summary-greater than 50% of time spent coordinating a plan of care for numerous diagnoses     London Sheer, CMA [phone]

## 2016-05-07 ENCOUNTER — Encounter (HOSPITAL_COMMUNITY)
Admission: AD | Admit: 2016-05-07 | Discharge: 2016-05-07 | Disposition: A | Discharge status: Home / Self Care | Payer: Medicare Other | Source: Skilled Nursing Facility | Attending: Internal Medicine | Admitting: Internal Medicine

## 2016-05-07 DIAGNOSIS — M6281 Muscle weakness (generalized): Secondary | ICD-10-CM | POA: Insufficient documentation

## 2016-05-07 DIAGNOSIS — N183 Chronic kidney disease, stage 3 (moderate): Secondary | ICD-10-CM | POA: Insufficient documentation

## 2016-05-07 DIAGNOSIS — I82403 Acute embolism and thrombosis of unspecified deep veins of lower extremity, bilateral: Secondary | ICD-10-CM | POA: Diagnosis not present

## 2016-05-07 DIAGNOSIS — E1165 Type 2 diabetes mellitus with hyperglycemia: Secondary | ICD-10-CM | POA: Insufficient documentation

## 2016-05-07 LAB — BASIC METABOLIC PANEL
ANION GAP: 6 (ref 5–15)
BUN: 21 mg/dL — ABNORMAL HIGH (ref 6–20)
CHLORIDE: 108 mmol/L (ref 101–111)
CO2: 25 mmol/L (ref 22–32)
Calcium: 8.3 mg/dL — ABNORMAL LOW (ref 8.9–10.3)
Creatinine, Ser: 1.09 mg/dL (ref 0.61–1.24)
GFR calc Af Amer: >60 mL/min (ref >60)
Glucose, Bld: 100 mg/dL — ABNORMAL HIGH (ref 65–99)
POTASSIUM: 3.5 mmol/L (ref 3.5–5.1)
SODIUM: 139 mmol/L (ref 135–145)

## 2016-05-11 DIAGNOSIS — I82409 Acute embolism and thrombosis of unspecified deep veins of unspecified lower extremity: Secondary | ICD-10-CM | POA: Diagnosis not present

## 2016-05-11 DIAGNOSIS — E108 Type 1 diabetes mellitus with unspecified complications: Secondary | ICD-10-CM | POA: Diagnosis not present

## 2016-05-11 DIAGNOSIS — K5900 Constipation, unspecified: Secondary | ICD-10-CM | POA: Diagnosis not present

## 2016-05-12 DIAGNOSIS — E1122 Type 2 diabetes mellitus with diabetic chronic kidney disease: Secondary | ICD-10-CM | POA: Diagnosis not present

## 2016-05-12 DIAGNOSIS — Z794 Long term (current) use of insulin: Secondary | ICD-10-CM | POA: Diagnosis not present

## 2016-05-12 DIAGNOSIS — M109 Gout, unspecified: Secondary | ICD-10-CM | POA: Diagnosis not present

## 2016-05-12 DIAGNOSIS — R627 Adult failure to thrive: Secondary | ICD-10-CM | POA: Diagnosis not present

## 2016-05-12 DIAGNOSIS — Z7901 Long term (current) use of anticoagulants: Secondary | ICD-10-CM | POA: Diagnosis not present

## 2016-05-12 DIAGNOSIS — I129 Hypertensive chronic kidney disease with stage 1 through stage 4 chronic kidney disease, or unspecified chronic kidney disease: Secondary | ICD-10-CM | POA: Diagnosis not present

## 2016-05-12 DIAGNOSIS — N401 Enlarged prostate with lower urinary tract symptoms: Secondary | ICD-10-CM | POA: Diagnosis not present

## 2016-05-12 DIAGNOSIS — N183 Chronic kidney disease, stage 3 (moderate): Secondary | ICD-10-CM | POA: Diagnosis not present

## 2016-05-12 DIAGNOSIS — N320 Bladder-neck obstruction: Secondary | ICD-10-CM | POA: Diagnosis not present

## 2016-05-12 DIAGNOSIS — I82402 Acute embolism and thrombosis of unspecified deep veins of left lower extremity: Secondary | ICD-10-CM | POA: Diagnosis not present

## 2016-05-12 DIAGNOSIS — E1165 Type 2 diabetes mellitus with hyperglycemia: Secondary | ICD-10-CM | POA: Diagnosis not present

## 2016-05-13 DIAGNOSIS — N401 Enlarged prostate with lower urinary tract symptoms: Secondary | ICD-10-CM | POA: Diagnosis not present

## 2016-05-13 DIAGNOSIS — Z7901 Long term (current) use of anticoagulants: Secondary | ICD-10-CM | POA: Diagnosis not present

## 2016-05-13 DIAGNOSIS — N183 Chronic kidney disease, stage 3 (moderate): Secondary | ICD-10-CM | POA: Diagnosis not present

## 2016-05-13 DIAGNOSIS — I129 Hypertensive chronic kidney disease with stage 1 through stage 4 chronic kidney disease, or unspecified chronic kidney disease: Secondary | ICD-10-CM | POA: Diagnosis not present

## 2016-05-13 DIAGNOSIS — Z794 Long term (current) use of insulin: Secondary | ICD-10-CM | POA: Diagnosis not present

## 2016-05-13 DIAGNOSIS — E1122 Type 2 diabetes mellitus with diabetic chronic kidney disease: Secondary | ICD-10-CM | POA: Diagnosis not present

## 2016-05-13 DIAGNOSIS — M109 Gout, unspecified: Secondary | ICD-10-CM | POA: Diagnosis not present

## 2016-05-13 DIAGNOSIS — R627 Adult failure to thrive: Secondary | ICD-10-CM | POA: Diagnosis not present

## 2016-05-13 DIAGNOSIS — E1165 Type 2 diabetes mellitus with hyperglycemia: Secondary | ICD-10-CM | POA: Diagnosis not present

## 2016-05-13 DIAGNOSIS — I82402 Acute embolism and thrombosis of unspecified deep veins of left lower extremity: Secondary | ICD-10-CM | POA: Diagnosis not present

## 2016-05-13 DIAGNOSIS — N320 Bladder-neck obstruction: Secondary | ICD-10-CM | POA: Diagnosis not present

## 2016-05-14 DIAGNOSIS — N183 Chronic kidney disease, stage 3 (moderate): Secondary | ICD-10-CM | POA: Diagnosis not present

## 2016-05-14 DIAGNOSIS — N320 Bladder-neck obstruction: Secondary | ICD-10-CM | POA: Diagnosis not present

## 2016-05-14 DIAGNOSIS — R627 Adult failure to thrive: Secondary | ICD-10-CM | POA: Diagnosis not present

## 2016-05-14 DIAGNOSIS — N401 Enlarged prostate with lower urinary tract symptoms: Secondary | ICD-10-CM | POA: Diagnosis not present

## 2016-05-14 DIAGNOSIS — M109 Gout, unspecified: Secondary | ICD-10-CM | POA: Diagnosis not present

## 2016-05-14 DIAGNOSIS — Z794 Long term (current) use of insulin: Secondary | ICD-10-CM | POA: Diagnosis not present

## 2016-05-14 DIAGNOSIS — Z7901 Long term (current) use of anticoagulants: Secondary | ICD-10-CM | POA: Diagnosis not present

## 2016-05-14 DIAGNOSIS — E1165 Type 2 diabetes mellitus with hyperglycemia: Secondary | ICD-10-CM | POA: Diagnosis not present

## 2016-05-14 DIAGNOSIS — I129 Hypertensive chronic kidney disease with stage 1 through stage 4 chronic kidney disease, or unspecified chronic kidney disease: Secondary | ICD-10-CM | POA: Diagnosis not present

## 2016-05-14 DIAGNOSIS — I82402 Acute embolism and thrombosis of unspecified deep veins of left lower extremity: Secondary | ICD-10-CM | POA: Diagnosis not present

## 2016-05-14 DIAGNOSIS — E1122 Type 2 diabetes mellitus with diabetic chronic kidney disease: Secondary | ICD-10-CM | POA: Diagnosis not present

## 2016-05-15 DIAGNOSIS — I82402 Acute embolism and thrombosis of unspecified deep veins of left lower extremity: Secondary | ICD-10-CM | POA: Diagnosis not present

## 2016-05-15 DIAGNOSIS — M109 Gout, unspecified: Secondary | ICD-10-CM | POA: Diagnosis not present

## 2016-05-15 DIAGNOSIS — N401 Enlarged prostate with lower urinary tract symptoms: Secondary | ICD-10-CM | POA: Diagnosis not present

## 2016-05-15 DIAGNOSIS — N320 Bladder-neck obstruction: Secondary | ICD-10-CM | POA: Diagnosis not present

## 2016-05-15 DIAGNOSIS — N183 Chronic kidney disease, stage 3 (moderate): Secondary | ICD-10-CM | POA: Diagnosis not present

## 2016-05-15 DIAGNOSIS — E1122 Type 2 diabetes mellitus with diabetic chronic kidney disease: Secondary | ICD-10-CM | POA: Diagnosis not present

## 2016-05-15 DIAGNOSIS — Z794 Long term (current) use of insulin: Secondary | ICD-10-CM | POA: Diagnosis not present

## 2016-05-15 DIAGNOSIS — I129 Hypertensive chronic kidney disease with stage 1 through stage 4 chronic kidney disease, or unspecified chronic kidney disease: Secondary | ICD-10-CM | POA: Diagnosis not present

## 2016-05-15 DIAGNOSIS — E1165 Type 2 diabetes mellitus with hyperglycemia: Secondary | ICD-10-CM | POA: Diagnosis not present

## 2016-05-15 DIAGNOSIS — R627 Adult failure to thrive: Secondary | ICD-10-CM | POA: Diagnosis not present

## 2016-05-15 DIAGNOSIS — Z7901 Long term (current) use of anticoagulants: Secondary | ICD-10-CM | POA: Diagnosis not present

## 2016-05-18 DIAGNOSIS — I129 Hypertensive chronic kidney disease with stage 1 through stage 4 chronic kidney disease, or unspecified chronic kidney disease: Secondary | ICD-10-CM | POA: Diagnosis not present

## 2016-05-18 DIAGNOSIS — Z7901 Long term (current) use of anticoagulants: Secondary | ICD-10-CM | POA: Diagnosis not present

## 2016-05-18 DIAGNOSIS — R627 Adult failure to thrive: Secondary | ICD-10-CM | POA: Diagnosis not present

## 2016-05-18 DIAGNOSIS — M109 Gout, unspecified: Secondary | ICD-10-CM | POA: Diagnosis not present

## 2016-05-18 DIAGNOSIS — N183 Chronic kidney disease, stage 3 (moderate): Secondary | ICD-10-CM | POA: Diagnosis not present

## 2016-05-18 DIAGNOSIS — E1122 Type 2 diabetes mellitus with diabetic chronic kidney disease: Secondary | ICD-10-CM | POA: Diagnosis not present

## 2016-05-18 DIAGNOSIS — E1165 Type 2 diabetes mellitus with hyperglycemia: Secondary | ICD-10-CM | POA: Diagnosis not present

## 2016-05-18 DIAGNOSIS — Z794 Long term (current) use of insulin: Secondary | ICD-10-CM | POA: Diagnosis not present

## 2016-05-18 DIAGNOSIS — N320 Bladder-neck obstruction: Secondary | ICD-10-CM | POA: Diagnosis not present

## 2016-05-18 DIAGNOSIS — I82402 Acute embolism and thrombosis of unspecified deep veins of left lower extremity: Secondary | ICD-10-CM | POA: Diagnosis not present

## 2016-05-18 DIAGNOSIS — N401 Enlarged prostate with lower urinary tract symptoms: Secondary | ICD-10-CM | POA: Diagnosis not present

## 2016-05-21 DIAGNOSIS — Z794 Long term (current) use of insulin: Secondary | ICD-10-CM | POA: Diagnosis not present

## 2016-05-21 DIAGNOSIS — I129 Hypertensive chronic kidney disease with stage 1 through stage 4 chronic kidney disease, or unspecified chronic kidney disease: Secondary | ICD-10-CM | POA: Diagnosis not present

## 2016-05-21 DIAGNOSIS — M109 Gout, unspecified: Secondary | ICD-10-CM | POA: Diagnosis not present

## 2016-05-21 DIAGNOSIS — N183 Chronic kidney disease, stage 3 (moderate): Secondary | ICD-10-CM | POA: Diagnosis not present

## 2016-05-21 DIAGNOSIS — E1165 Type 2 diabetes mellitus with hyperglycemia: Secondary | ICD-10-CM | POA: Diagnosis not present

## 2016-05-21 DIAGNOSIS — Z7901 Long term (current) use of anticoagulants: Secondary | ICD-10-CM | POA: Diagnosis not present

## 2016-05-21 DIAGNOSIS — R627 Adult failure to thrive: Secondary | ICD-10-CM | POA: Diagnosis not present

## 2016-05-21 DIAGNOSIS — N401 Enlarged prostate with lower urinary tract symptoms: Secondary | ICD-10-CM | POA: Diagnosis not present

## 2016-05-21 DIAGNOSIS — E1122 Type 2 diabetes mellitus with diabetic chronic kidney disease: Secondary | ICD-10-CM | POA: Diagnosis not present

## 2016-05-21 DIAGNOSIS — N320 Bladder-neck obstruction: Secondary | ICD-10-CM | POA: Diagnosis not present

## 2016-05-21 DIAGNOSIS — I82402 Acute embolism and thrombosis of unspecified deep veins of left lower extremity: Secondary | ICD-10-CM | POA: Diagnosis not present

## 2016-05-25 DIAGNOSIS — N401 Enlarged prostate with lower urinary tract symptoms: Secondary | ICD-10-CM | POA: Diagnosis not present

## 2016-05-25 DIAGNOSIS — I129 Hypertensive chronic kidney disease with stage 1 through stage 4 chronic kidney disease, or unspecified chronic kidney disease: Secondary | ICD-10-CM | POA: Diagnosis not present

## 2016-05-25 DIAGNOSIS — M109 Gout, unspecified: Secondary | ICD-10-CM | POA: Diagnosis not present

## 2016-05-25 DIAGNOSIS — Z794 Long term (current) use of insulin: Secondary | ICD-10-CM | POA: Diagnosis not present

## 2016-05-25 DIAGNOSIS — N320 Bladder-neck obstruction: Secondary | ICD-10-CM | POA: Diagnosis not present

## 2016-05-25 DIAGNOSIS — R627 Adult failure to thrive: Secondary | ICD-10-CM | POA: Diagnosis not present

## 2016-05-25 DIAGNOSIS — E1122 Type 2 diabetes mellitus with diabetic chronic kidney disease: Secondary | ICD-10-CM | POA: Diagnosis not present

## 2016-05-25 DIAGNOSIS — E1165 Type 2 diabetes mellitus with hyperglycemia: Secondary | ICD-10-CM | POA: Diagnosis not present

## 2016-05-25 DIAGNOSIS — Z7901 Long term (current) use of anticoagulants: Secondary | ICD-10-CM | POA: Diagnosis not present

## 2016-05-25 DIAGNOSIS — N183 Chronic kidney disease, stage 3 (moderate): Secondary | ICD-10-CM | POA: Diagnosis not present

## 2016-05-25 DIAGNOSIS — I82402 Acute embolism and thrombosis of unspecified deep veins of left lower extremity: Secondary | ICD-10-CM | POA: Diagnosis not present

## 2016-05-27 DIAGNOSIS — Z794 Long term (current) use of insulin: Secondary | ICD-10-CM | POA: Diagnosis not present

## 2016-05-27 DIAGNOSIS — M109 Gout, unspecified: Secondary | ICD-10-CM | POA: Diagnosis not present

## 2016-05-27 DIAGNOSIS — N320 Bladder-neck obstruction: Secondary | ICD-10-CM | POA: Diagnosis not present

## 2016-05-27 DIAGNOSIS — E1165 Type 2 diabetes mellitus with hyperglycemia: Secondary | ICD-10-CM | POA: Diagnosis not present

## 2016-05-27 DIAGNOSIS — N401 Enlarged prostate with lower urinary tract symptoms: Secondary | ICD-10-CM | POA: Diagnosis not present

## 2016-05-27 DIAGNOSIS — I129 Hypertensive chronic kidney disease with stage 1 through stage 4 chronic kidney disease, or unspecified chronic kidney disease: Secondary | ICD-10-CM | POA: Diagnosis not present

## 2016-05-27 DIAGNOSIS — R627 Adult failure to thrive: Secondary | ICD-10-CM | POA: Diagnosis not present

## 2016-05-27 DIAGNOSIS — E1122 Type 2 diabetes mellitus with diabetic chronic kidney disease: Secondary | ICD-10-CM | POA: Diagnosis not present

## 2016-05-27 DIAGNOSIS — I82402 Acute embolism and thrombosis of unspecified deep veins of left lower extremity: Secondary | ICD-10-CM | POA: Diagnosis not present

## 2016-05-27 DIAGNOSIS — Z7901 Long term (current) use of anticoagulants: Secondary | ICD-10-CM | POA: Diagnosis not present

## 2016-05-27 DIAGNOSIS — N183 Chronic kidney disease, stage 3 (moderate): Secondary | ICD-10-CM | POA: Diagnosis not present

## 2016-05-29 DIAGNOSIS — N401 Enlarged prostate with lower urinary tract symptoms: Secondary | ICD-10-CM | POA: Diagnosis not present

## 2016-05-29 DIAGNOSIS — Z7901 Long term (current) use of anticoagulants: Secondary | ICD-10-CM | POA: Diagnosis not present

## 2016-05-29 DIAGNOSIS — M109 Gout, unspecified: Secondary | ICD-10-CM | POA: Diagnosis not present

## 2016-05-29 DIAGNOSIS — E1122 Type 2 diabetes mellitus with diabetic chronic kidney disease: Secondary | ICD-10-CM | POA: Diagnosis not present

## 2016-05-29 DIAGNOSIS — R627 Adult failure to thrive: Secondary | ICD-10-CM | POA: Diagnosis not present

## 2016-05-29 DIAGNOSIS — Z794 Long term (current) use of insulin: Secondary | ICD-10-CM | POA: Diagnosis not present

## 2016-05-29 DIAGNOSIS — N320 Bladder-neck obstruction: Secondary | ICD-10-CM | POA: Diagnosis not present

## 2016-05-29 DIAGNOSIS — N183 Chronic kidney disease, stage 3 (moderate): Secondary | ICD-10-CM | POA: Diagnosis not present

## 2016-05-29 DIAGNOSIS — I129 Hypertensive chronic kidney disease with stage 1 through stage 4 chronic kidney disease, or unspecified chronic kidney disease: Secondary | ICD-10-CM | POA: Diagnosis not present

## 2016-05-29 DIAGNOSIS — E1165 Type 2 diabetes mellitus with hyperglycemia: Secondary | ICD-10-CM | POA: Diagnosis not present

## 2016-05-29 DIAGNOSIS — I82402 Acute embolism and thrombosis of unspecified deep veins of left lower extremity: Secondary | ICD-10-CM | POA: Diagnosis not present

## 2016-06-01 DIAGNOSIS — R627 Adult failure to thrive: Secondary | ICD-10-CM | POA: Diagnosis not present

## 2016-06-01 DIAGNOSIS — M109 Gout, unspecified: Secondary | ICD-10-CM | POA: Diagnosis not present

## 2016-06-01 DIAGNOSIS — N401 Enlarged prostate with lower urinary tract symptoms: Secondary | ICD-10-CM | POA: Diagnosis not present

## 2016-06-01 DIAGNOSIS — N320 Bladder-neck obstruction: Secondary | ICD-10-CM | POA: Diagnosis not present

## 2016-06-01 DIAGNOSIS — E1165 Type 2 diabetes mellitus with hyperglycemia: Secondary | ICD-10-CM | POA: Diagnosis not present

## 2016-06-01 DIAGNOSIS — E1122 Type 2 diabetes mellitus with diabetic chronic kidney disease: Secondary | ICD-10-CM | POA: Diagnosis not present

## 2016-06-01 DIAGNOSIS — N183 Chronic kidney disease, stage 3 (moderate): Secondary | ICD-10-CM | POA: Diagnosis not present

## 2016-06-01 DIAGNOSIS — Z7901 Long term (current) use of anticoagulants: Secondary | ICD-10-CM | POA: Diagnosis not present

## 2016-06-01 DIAGNOSIS — I129 Hypertensive chronic kidney disease with stage 1 through stage 4 chronic kidney disease, or unspecified chronic kidney disease: Secondary | ICD-10-CM | POA: Diagnosis not present

## 2016-06-01 DIAGNOSIS — I82402 Acute embolism and thrombosis of unspecified deep veins of left lower extremity: Secondary | ICD-10-CM | POA: Diagnosis not present

## 2016-06-01 DIAGNOSIS — Z794 Long term (current) use of insulin: Secondary | ICD-10-CM | POA: Diagnosis not present

## 2016-06-02 DIAGNOSIS — N183 Chronic kidney disease, stage 3 (moderate): Secondary | ICD-10-CM | POA: Diagnosis not present

## 2016-06-02 DIAGNOSIS — Z794 Long term (current) use of insulin: Secondary | ICD-10-CM | POA: Diagnosis not present

## 2016-06-02 DIAGNOSIS — I82402 Acute embolism and thrombosis of unspecified deep veins of left lower extremity: Secondary | ICD-10-CM | POA: Diagnosis not present

## 2016-06-02 DIAGNOSIS — I129 Hypertensive chronic kidney disease with stage 1 through stage 4 chronic kidney disease, or unspecified chronic kidney disease: Secondary | ICD-10-CM | POA: Diagnosis not present

## 2016-06-02 DIAGNOSIS — N401 Enlarged prostate with lower urinary tract symptoms: Secondary | ICD-10-CM | POA: Diagnosis not present

## 2016-06-02 DIAGNOSIS — E1122 Type 2 diabetes mellitus with diabetic chronic kidney disease: Secondary | ICD-10-CM | POA: Diagnosis not present

## 2016-06-02 DIAGNOSIS — M109 Gout, unspecified: Secondary | ICD-10-CM | POA: Diagnosis not present

## 2016-06-02 DIAGNOSIS — E1165 Type 2 diabetes mellitus with hyperglycemia: Secondary | ICD-10-CM | POA: Diagnosis not present

## 2016-06-02 DIAGNOSIS — R627 Adult failure to thrive: Secondary | ICD-10-CM | POA: Diagnosis not present

## 2016-06-02 DIAGNOSIS — Z7901 Long term (current) use of anticoagulants: Secondary | ICD-10-CM | POA: Diagnosis not present

## 2016-06-02 DIAGNOSIS — N320 Bladder-neck obstruction: Secondary | ICD-10-CM | POA: Diagnosis not present

## 2016-06-03 ENCOUNTER — Ambulatory Visit (INDEPENDENT_AMBULATORY_CARE_PROVIDER_SITE_OTHER): Payer: Medicare Other | Admitting: Urology

## 2016-06-03 DIAGNOSIS — R339 Retention of urine, unspecified: Secondary | ICD-10-CM | POA: Diagnosis not present

## 2016-06-04 DIAGNOSIS — R627 Adult failure to thrive: Secondary | ICD-10-CM | POA: Diagnosis not present

## 2016-06-04 DIAGNOSIS — N320 Bladder-neck obstruction: Secondary | ICD-10-CM | POA: Diagnosis not present

## 2016-06-04 DIAGNOSIS — I129 Hypertensive chronic kidney disease with stage 1 through stage 4 chronic kidney disease, or unspecified chronic kidney disease: Secondary | ICD-10-CM | POA: Diagnosis not present

## 2016-06-04 DIAGNOSIS — M109 Gout, unspecified: Secondary | ICD-10-CM | POA: Diagnosis not present

## 2016-06-04 DIAGNOSIS — E1165 Type 2 diabetes mellitus with hyperglycemia: Secondary | ICD-10-CM | POA: Diagnosis not present

## 2016-06-04 DIAGNOSIS — I82402 Acute embolism and thrombosis of unspecified deep veins of left lower extremity: Secondary | ICD-10-CM | POA: Diagnosis not present

## 2016-06-04 DIAGNOSIS — Z7901 Long term (current) use of anticoagulants: Secondary | ICD-10-CM | POA: Diagnosis not present

## 2016-06-04 DIAGNOSIS — Z794 Long term (current) use of insulin: Secondary | ICD-10-CM | POA: Diagnosis not present

## 2016-06-04 DIAGNOSIS — N401 Enlarged prostate with lower urinary tract symptoms: Secondary | ICD-10-CM | POA: Diagnosis not present

## 2016-06-04 DIAGNOSIS — N183 Chronic kidney disease, stage 3 (moderate): Secondary | ICD-10-CM | POA: Diagnosis not present

## 2016-06-04 DIAGNOSIS — E1122 Type 2 diabetes mellitus with diabetic chronic kidney disease: Secondary | ICD-10-CM | POA: Diagnosis not present

## 2016-06-07 ENCOUNTER — Emergency Department (HOSPITAL_COMMUNITY)
Admission: EM | Admit: 2016-06-07 | Discharge: 2016-06-07 | Disposition: A | Discharge status: Home / Self Care | Payer: Medicare Other | Attending: Emergency Medicine | Admitting: Emergency Medicine

## 2016-06-07 ENCOUNTER — Encounter (HOSPITAL_COMMUNITY): Payer: Self-pay | Admitting: Emergency Medicine

## 2016-06-07 DIAGNOSIS — E119 Type 2 diabetes mellitus without complications: Secondary | ICD-10-CM | POA: Insufficient documentation

## 2016-06-07 DIAGNOSIS — I10 Essential (primary) hypertension: Secondary | ICD-10-CM | POA: Insufficient documentation

## 2016-06-07 DIAGNOSIS — F329 Major depressive disorder, single episode, unspecified: Secondary | ICD-10-CM | POA: Insufficient documentation

## 2016-06-07 DIAGNOSIS — E039 Hypothyroidism, unspecified: Secondary | ICD-10-CM | POA: Diagnosis not present

## 2016-06-07 DIAGNOSIS — Y69 Unspecified misadventure during surgical and medical care: Secondary | ICD-10-CM | POA: Diagnosis not present

## 2016-06-07 DIAGNOSIS — Z794 Long term (current) use of insulin: Secondary | ICD-10-CM | POA: Insufficient documentation

## 2016-06-07 DIAGNOSIS — T83091A Other mechanical complication of indwelling urethral catheter, initial encounter: Secondary | ICD-10-CM

## 2016-06-07 DIAGNOSIS — N390 Urinary tract infection, site not specified: Secondary | ICD-10-CM

## 2016-06-07 DIAGNOSIS — Z79899 Other long term (current) drug therapy: Secondary | ICD-10-CM | POA: Diagnosis not present

## 2016-06-07 DIAGNOSIS — T83198A Other mechanical complication of other urinary devices and implants, initial encounter: Secondary | ICD-10-CM | POA: Diagnosis not present

## 2016-06-07 DIAGNOSIS — T83098A Other mechanical complication of other indwelling urethral catheter, initial encounter: Secondary | ICD-10-CM | POA: Insufficient documentation

## 2016-06-07 DIAGNOSIS — T83498A Other mechanical complication of other prosthetic devices, implants and grafts of genital tract, initial encounter: Secondary | ICD-10-CM | POA: Diagnosis not present

## 2016-06-07 DIAGNOSIS — T83038A Leakage of other indwelling urethral catheter, initial encounter: Secondary | ICD-10-CM | POA: Diagnosis not present

## 2016-06-07 DIAGNOSIS — R339 Retention of urine, unspecified: Secondary | ICD-10-CM | POA: Diagnosis present

## 2016-06-07 LAB — URINE MICROSCOPIC-ADD ON

## 2016-06-07 LAB — URINALYSIS, ROUTINE W REFLEX MICROSCOPIC
Bilirubin Urine: NEGATIVE
Glucose, UA: NEGATIVE mg/dL
KETONES UR: NEGATIVE mg/dL
NITRITE: NEGATIVE
PROTEIN: 100 mg/dL — AB
Specific Gravity, Urine: 1.01 (ref 1.005–1.030)
pH: 8.5 — ABNORMAL HIGH (ref 5.0–8.0)

## 2016-06-07 MED ORDER — CEPHALEXIN 500 MG PO CAPS: 500.0000 mg | ORAL_CAPSULE | Freq: Four times a day (QID) | ORAL | Status: DC

## 2016-06-07 MED ORDER — CEPHALEXIN 500 MG PO CAPS
500.0000 mg | ORAL_CAPSULE | Freq: Once | ORAL | Status: AC
  Administered 2016-06-07: 500 mg via ORAL
  Filled 2016-06-07: qty 1

## 2016-06-07 NOTE — ED Notes (Signed)
Emptied Foley prior to d/c.

## 2016-06-07 NOTE — Discharge Instructions (Signed)

## 2016-06-07 NOTE — ED Notes (Signed)
Patient has foley catheter for approximately one month, empty foley bag around 9pm last night, currently no drainage noted, having burning sensation in urethra.

## 2016-06-07 NOTE — ED Provider Notes (Signed)
CSN: [id number]     Arrival date & time 06/07/16  0539 History   First MD Initiated Contact with Patient 06/07/16 [phone]     Chief Complaint  Patient presents with  . Urinary Retention     (Consider location/radiation/quality/duration/timing/severity/associated sxs/prior Treatment) HPI  The patient is an 82 year old male, he has a known indwelling Foley catheter for at least the last 3 months, he was diagnosed with benign prostatic hypertrophy that was causing significant urinary outlet obstruction as well as constipation. Since having the catheter placed he has generally done well though he has needed the catheter to be replaced several times. He reports that over the last 12 hours the patient has not had much in the way of urinary output. He does have some drainage around the catheter through the penis but is not filling the bag. He has also had a progressive pain with urination, he has a fullness to his lower abdomen, no fevers nausea or vomiting.  Past Medical History  Diagnosis Date  . Hypertension   . Diabetes mellitus   . Hypothyroidism   . Depression   . Enlarged prostate     Status post TURP; self caths @ home   Past Surgical History  Procedure Laterality Date  . Hernia repair    . Transurethral resection of prostate  09/22/2011    Procedure: TRANSURETHRAL RESECTION OF THE PROSTATE (TURP);  Surgeon: Ky BarbanMohammad I Javaid;  Location: [facility];  Service: Urology;  Laterality: N/A;   Family History  Problem Relation Age of Onset  . Hypertension Mother   . Cerebral aneurysm Mother   . Hypertension Brother   . Heart disease Father     Heart attack  . Cancer Neg Hx   . Diabetes Neg Hx     only among cousins   Social History  Substance Use Topics  . Smoking status: Never Smoker   . Smokeless tobacco: Never Used  . Alcohol Use: No    Review of Systems  All other systems reviewed and are negative.     Allergies  Thiazide-type diuretics  Home Medications   Prior to  Admission medications   Medication Sig Start Date End Date Taking? Authorizing Provider  ALPRAZolam Prudy Feeler(XANAX) 1 MG tablet Take 1 tablet (1 mg total) by mouth at bedtime as needed for sleep. 03/23/16  Yes Standley Brookinganiel P Goodrich, MD  citalopram (CELEXA) 20 MG tablet Take 1 tablet (20 mg total) by mouth daily. 03/23/16  Yes Standley Brookinganiel P Goodrich, MD  clobetasol ointment (TEMOVATE) 0.05 % Apply 1 application topically daily as needed. For rash on legs 02/09/14  Yes Historical Provider, MD  diltiazem (CARDIZEM CD) 300 MG 24 hr capsule Take 300 mg by mouth daily.    Yes Historical Provider, MD  fish oil-omega-3 fatty acids 1000 MG capsule Take 1 capsule (1 g total) by mouth 2 (two) times daily. 10/02/11  Yes Christiane Haorinna L Sullivan, MD  furosemide (LASIX) 20 MG tablet TAKE 20 MG DAILY AS NEEDED FOR LEG SWELLING. 04/30/16  Yes Laqueta LindenSuresh A Koneswaran, MD  GLUCERNA (GLUCERNA) LIQD Take 237 mLs by mouth 2 (two) times daily between meals.   Yes Historical Provider, MD  insulin detemir (LEVEMIR) 100 UNIT/ML injection Inject 20 Units into the skin at bedtime.    Yes Historical Provider, MD  levothyroxine (SYNTHROID, LEVOTHROID) 137 MCG tablet Take 137 mcg by mouth daily. 01/30/16  Yes Historical Provider, MD  loperamide (IMODIUM) 2 MG capsule Take 2 tablet by mouth daily as needed   Yes Historical  Provider, MD  polyethylene glycol (MIRALAX / GLYCOLAX) packet Take 17 g by mouth 2 (two) times daily. Give until he has daily soft stools.   Yes Historical Provider, MD  Potassium Chloride ER 20 MEQ TBCR Take 1 tablet by mouth twice a day   Yes Historical Provider, MD  senna (SENOKOT) 8.6 MG TABS tablet Take 1 tablet (8.6 mg total) by mouth daily. 03/11/16  Yes Lavera Guiseana Duo Liu, MD  Skin Protectants, Misc. (EUCERIN) cream Apply topical to BLE and feet for dry skin twice daily and as needed.   Yes Historical Provider, MD  Travoprost, BAK Free, (TRAVATAN) 0.004 % SOLN ophthalmic solution Place 1 drop into both eyes at bedtime.   Yes Historical  Provider, MD  vitamin B-12 (CYANOCOBALAMIN) 1000 MCG tablet Take one tablet by mouth once daily   Yes Historical Provider, MD  acetaminophen (TYLENOL) 325 MG tablet Take two tablets by mouth every 4 hours as needed for pain/temp    Historical Provider, MD  cephALEXin (KEFLEX) 500 MG capsule Take 1 capsule (500 mg total) by mouth 4 (four) times daily. 06/07/16   Eber HongBrian Lealand Elting, MD  ferrous sulfate 325 (65 FE) MG tablet Take 325 mg by mouth daily.  10/17/11   Vassie Lollarlos Madera, MD   BP 128/70 mmHg  Pulse 90  Temp(Src) 98.9 F (37.2 C) (Oral)  Resp 20  Ht 6\' 4"  (1.93 m)  Wt 250 lb (113.399 kg)  BMI 30.44 kg/m2  SpO2 96% Physical Exam  Constitutional: He appears well-developed and well-nourished. No distress.  HENT:  Head: Normocephalic and atraumatic.  Mouth/Throat: Oropharynx is clear and moist. No oropharyngeal exudate.  Eyes: Conjunctivae and EOM are normal. Pupils are equal, round, and reactive to light. Right eye exhibits no discharge. Left eye exhibits no discharge. No scleral icterus.  Neck: Normal range of motion. Neck supple. No JVD present. No thyromegaly present.  Cardiovascular: Normal rate, regular rhythm, normal heart sounds and intact distal pulses.  Exam reveals no gallop and no friction rub.   No murmur heard. Pulmonary/Chest: Effort normal and breath sounds normal. No respiratory distress. He has no wheezes. He has no rales.  Abdominal: Soft. Bowel sounds are normal. He exhibits no distension and no mass. There is no tenderness.  Genitourinary:  Penis normal appearing with urinary catheter in place - No drainage of bleeding  Musculoskeletal: Normal range of motion. He exhibits no edema or tenderness.  Lymphadenopathy:    He has no cervical adenopathy.  Neurological: He is alert. Coordination normal.  Skin: Skin is warm and dry. No rash noted. No erythema.  Psychiatric: He has a normal mood and affect. His behavior is normal.  Nursing note and vitals reviewed.   ED Course   Procedures (including critical care time) Labs Review Labs Reviewed  URINALYSIS, ROUTINE W REFLEX MICROSCOPIC (NOT AT [facility]) - Abnormal; Notable for the following:    APPearance CLOUDY (*)    pH 8.5 (*)    Hgb urine dipstick LARGE (*)    Protein, ur 100 (*)    Leukocytes, UA MODERATE (*)    All other components within normal limits  URINE MICROSCOPIC-ADD ON - Abnormal; Notable for the following:    Squamous Epithelial / LPF 0-5 (*)    Bacteria, UA MANY (*)    Crystals TRIPLE PHOSPHATE CRYSTALS (*)    All other components within normal limits  URINE CULTURE    Imaging Review No results found. I have personally reviewed and evaluated these images and lab results as  part of my medical decision-making.    MDM   Final diagnoses:  UTI (lower urinary tract infection)  Obstructed Foley catheter, initial encounter (HCC)    Well appearing, no distress, replace catheter - check UA for possible infection. There is thick sediment in the tubing.  UA with many bacteria - Keflex  Meds given in ED:  Medications  cephALEXin (KEFLEX) capsule 500 mg (not administered)    New Prescriptions   CEPHALEXIN (KEFLEX) 500 MG CAPSULE    Take 1 capsule (500 mg total) by mouth 4 (four) times daily.       Keion Neels MillEber Hong/02/17 [phone]

## 2016-06-09 DIAGNOSIS — E1165 Type 2 diabetes mellitus with hyperglycemia: Secondary | ICD-10-CM | POA: Diagnosis not present

## 2016-06-09 DIAGNOSIS — N401 Enlarged prostate with lower urinary tract symptoms: Secondary | ICD-10-CM | POA: Diagnosis not present

## 2016-06-09 DIAGNOSIS — E1122 Type 2 diabetes mellitus with diabetic chronic kidney disease: Secondary | ICD-10-CM | POA: Diagnosis not present

## 2016-06-09 DIAGNOSIS — N183 Chronic kidney disease, stage 3 (moderate): Secondary | ICD-10-CM | POA: Diagnosis not present

## 2016-06-09 DIAGNOSIS — N320 Bladder-neck obstruction: Secondary | ICD-10-CM | POA: Diagnosis not present

## 2016-06-09 DIAGNOSIS — Z794 Long term (current) use of insulin: Secondary | ICD-10-CM | POA: Diagnosis not present

## 2016-06-09 DIAGNOSIS — M109 Gout, unspecified: Secondary | ICD-10-CM | POA: Diagnosis not present

## 2016-06-09 DIAGNOSIS — I129 Hypertensive chronic kidney disease with stage 1 through stage 4 chronic kidney disease, or unspecified chronic kidney disease: Secondary | ICD-10-CM | POA: Diagnosis not present

## 2016-06-09 DIAGNOSIS — R627 Adult failure to thrive: Secondary | ICD-10-CM | POA: Diagnosis not present

## 2016-06-09 DIAGNOSIS — Z7901 Long term (current) use of anticoagulants: Secondary | ICD-10-CM | POA: Diagnosis not present

## 2016-06-09 DIAGNOSIS — I82402 Acute embolism and thrombosis of unspecified deep veins of left lower extremity: Secondary | ICD-10-CM | POA: Diagnosis not present

## 2016-06-09 LAB — URINE CULTURE

## 2016-06-10 ENCOUNTER — Ambulatory Visit (INDEPENDENT_AMBULATORY_CARE_PROVIDER_SITE_OTHER): Payer: Self-pay | Admitting: Urology

## 2016-06-10 DIAGNOSIS — R339 Retention of urine, unspecified: Secondary | ICD-10-CM

## 2016-06-15 DIAGNOSIS — R627 Adult failure to thrive: Secondary | ICD-10-CM | POA: Diagnosis not present

## 2016-06-15 DIAGNOSIS — I82402 Acute embolism and thrombosis of unspecified deep veins of left lower extremity: Secondary | ICD-10-CM | POA: Diagnosis not present

## 2016-06-15 DIAGNOSIS — E1165 Type 2 diabetes mellitus with hyperglycemia: Secondary | ICD-10-CM | POA: Diagnosis not present

## 2016-06-15 DIAGNOSIS — Z7901 Long term (current) use of anticoagulants: Secondary | ICD-10-CM | POA: Diagnosis not present

## 2016-06-15 DIAGNOSIS — N401 Enlarged prostate with lower urinary tract symptoms: Secondary | ICD-10-CM | POA: Diagnosis not present

## 2016-06-15 DIAGNOSIS — N183 Chronic kidney disease, stage 3 (moderate): Secondary | ICD-10-CM | POA: Diagnosis not present

## 2016-06-15 DIAGNOSIS — M109 Gout, unspecified: Secondary | ICD-10-CM | POA: Diagnosis not present

## 2016-06-15 DIAGNOSIS — I129 Hypertensive chronic kidney disease with stage 1 through stage 4 chronic kidney disease, or unspecified chronic kidney disease: Secondary | ICD-10-CM | POA: Diagnosis not present

## 2016-06-15 DIAGNOSIS — E1122 Type 2 diabetes mellitus with diabetic chronic kidney disease: Secondary | ICD-10-CM | POA: Diagnosis not present

## 2016-06-15 DIAGNOSIS — N320 Bladder-neck obstruction: Secondary | ICD-10-CM | POA: Diagnosis not present

## 2016-06-15 DIAGNOSIS — Z794 Long term (current) use of insulin: Secondary | ICD-10-CM | POA: Diagnosis not present

## 2016-06-17 ENCOUNTER — Observation Stay (HOSPITAL_COMMUNITY)
Admission: EM | Admit: 2016-06-17 | Discharge: 2016-06-19 | Disposition: A | Discharge status: Home / Self Care | Payer: Medicare Other | Attending: Internal Medicine | Admitting: Internal Medicine

## 2016-06-17 ENCOUNTER — Encounter (HOSPITAL_COMMUNITY): Payer: Self-pay | Admitting: *Deleted

## 2016-06-17 DIAGNOSIS — R1909 Other intra-abdominal and pelvic swelling, mass and lump: Secondary | ICD-10-CM | POA: Diagnosis present

## 2016-06-17 DIAGNOSIS — E039 Hypothyroidism, unspecified: Secondary | ICD-10-CM | POA: Insufficient documentation

## 2016-06-17 DIAGNOSIS — N5089 Other specified disorders of the male genital organs: Secondary | ICD-10-CM

## 2016-06-17 DIAGNOSIS — M7989 Other specified soft tissue disorders: Secondary | ICD-10-CM | POA: Insufficient documentation

## 2016-06-17 DIAGNOSIS — N183 Chronic kidney disease, stage 3 unspecified: Secondary | ICD-10-CM | POA: Diagnosis present

## 2016-06-17 DIAGNOSIS — E876 Hypokalemia: Secondary | ICD-10-CM | POA: Diagnosis present

## 2016-06-17 DIAGNOSIS — I10 Essential (primary) hypertension: Secondary | ICD-10-CM | POA: Insufficient documentation

## 2016-06-17 DIAGNOSIS — E118 Type 2 diabetes mellitus with unspecified complications: Secondary | ICD-10-CM | POA: Diagnosis present

## 2016-06-17 DIAGNOSIS — I5189 Other ill-defined heart diseases: Secondary | ICD-10-CM | POA: Diagnosis present

## 2016-06-17 DIAGNOSIS — Z79899 Other long term (current) drug therapy: Secondary | ICD-10-CM | POA: Diagnosis not present

## 2016-06-17 DIAGNOSIS — Z794 Long term (current) use of insulin: Secondary | ICD-10-CM | POA: Diagnosis not present

## 2016-06-17 DIAGNOSIS — E119 Type 2 diabetes mellitus without complications: Secondary | ICD-10-CM | POA: Diagnosis not present

## 2016-06-17 DIAGNOSIS — N179 Acute kidney failure, unspecified: Principal | ICD-10-CM | POA: Diagnosis present

## 2016-06-17 DIAGNOSIS — F329 Major depressive disorder, single episode, unspecified: Secondary | ICD-10-CM | POA: Diagnosis not present

## 2016-06-17 DIAGNOSIS — N400 Enlarged prostate without lower urinary tract symptoms: Secondary | ICD-10-CM | POA: Diagnosis present

## 2016-06-17 DIAGNOSIS — E43 Unspecified severe protein-calorie malnutrition: Secondary | ICD-10-CM | POA: Diagnosis present

## 2016-06-17 DIAGNOSIS — I82409 Acute embolism and thrombosis of unspecified deep veins of unspecified lower extremity: Secondary | ICD-10-CM | POA: Diagnosis present

## 2016-06-17 MED ORDER — POTASSIUM CHLORIDE 10 MEQ/100ML IV SOLN
10.0000 meq | Freq: Once | INTRAVENOUS | Status: AC
  Administered 2016-06-18: 10 meq via INTRAVENOUS
  Filled 2016-06-17: qty 100

## 2016-06-17 NOTE — ED Provider Notes (Signed)
CSN: [id number]     Arrival date & time 06/17/16  1957 History  By signing my name below, I, Garrett Bradley, attest that this documentation has been prepared under the direction and in the presence of Shon Baton, MD. Electronically Signed: Angelene Giovanni, ED Scribe. 06/17/2016. 11:54 PM.      Chief Complaint  Patient presents with  . Groin Swelling   The history is provided by the patient. No language interpreter was used.   HPI Comments: Garrett Bradley is a 82 y.o. male  With a hx of HTN, DM, and Enlarged prostate who presents to the Emergency Department complaining of gradually worsening bilateral scrotal swelling onset 3 days ago. Pt denies any pain in the area. Denies fevers. He reports associated bilateral ankle swelling. He states that he is here because his home health aid advised him to come to the ED for an evaluation. Patient has a chronic indwelling Foley that he reports has been draining well. Pt is currently on Lasix. He denies any fever, chills, or SOB.   Past Medical History  Diagnosis Date  . Hypertension   . Diabetes mellitus   . Hypothyroidism   . Depression   . Enlarged prostate     Status post TURP; self caths @ home   Past Surgical History  Procedure Laterality Date  . Hernia repair    . Transurethral resection of prostate  09/22/2011    Procedure: TRANSURETHRAL RESECTION OF THE PROSTATE (TURP);  Surgeon: Ky Barban;  Location: [facility];  Service: Urology;  Laterality: N/A;   Family History  Problem Relation Age of Onset  . Hypertension Mother   . Cerebral aneurysm Mother   . Hypertension Brother   . Heart disease Father     Heart attack  . Cancer Neg Hx   . Diabetes Neg Hx     only among cousins   Social History  Substance Use Topics  . Smoking status: Never Smoker   . Smokeless tobacco: Never Used  . Alcohol Use: No    Review of Systems  Constitutional: Negative for fever.  Respiratory: Negative for shortness of breath.    Cardiovascular: Positive for leg swelling.  Gastrointestinal: Negative for abdominal pain.  Genitourinary: Positive for scrotal swelling. Negative for testicular pain.  Musculoskeletal: Negative for back pain.  All other systems reviewed and are negative.     Allergies  Thiazide-type diuretics  Home Medications   Prior to Admission medications   Medication Sig Start Date End Date Taking? Authorizing Provider  acetaminophen (TYLENOL) 325 MG tablet Take two tablets by mouth every 4 hours as needed for pain/temp   Yes Historical Provider, MD  ALPRAZolam (XANAX) 1 MG tablet Take 1 tablet (1 mg total) by mouth at bedtime as needed for sleep. 03/23/16  Yes Standley Brooking, MD  cephALEXin (KEFLEX) 500 MG capsule Take 1 capsule (500 mg total) by mouth 4 (four) times daily. 06/07/16  Yes Eber Hong, MD  citalopram (CELEXA) 20 MG tablet Take 1 tablet (20 mg total) by mouth daily. 03/23/16  Yes Standley Brooking, MD  clobetasol ointment (TEMOVATE) 0.05 % Apply 1 application topically daily as needed. For rash on legs 02/09/14  Yes Historical Provider, MD  diltiazem (CARDIZEM CD) 300 MG 24 hr capsule Take 300 mg by mouth daily.    Yes Historical Provider, MD  fish oil-omega-3 fatty acids 1000 MG capsule Take 1 capsule (1 g total) by mouth 2 (two) times daily. 10/02/11  Yes Corinna L  Lendell CapriceSullivan, MD  furosemide (LASIX) 20 MG tablet TAKE 20 MG DAILY AS NEEDED FOR LEG SWELLING. 04/30/16  Yes Laqueta LindenSuresh A Koneswaran, MD  GLUCERNA (GLUCERNA) LIQD Take 237 mLs by mouth 2 (two) times daily between meals.   Yes Historical Provider, MD  insulin detemir (LEVEMIR) 100 UNIT/ML injection Inject 20 Units into the skin at bedtime.    Yes Historical Provider, MD  levothyroxine (SYNTHROID, LEVOTHROID) 137 MCG tablet Take 137 mcg by mouth daily. 01/30/16  Yes Historical Provider, MD  loperamide (IMODIUM) 2 MG capsule Take 2 tablet by mouth daily as needed   Yes Historical Provider, MD  polyethylene glycol (MIRALAX / GLYCOLAX)  packet Take 17 g by mouth 2 (two) times daily. Give until he has daily soft stools.   Yes Historical Provider, MD  Potassium Chloride ER 20 MEQ TBCR Take 1 tablet by mouth twice a day   Yes Historical Provider, MD  senna (SENOKOT) 8.6 MG TABS tablet Take 1 tablet (8.6 mg total) by mouth daily. 03/11/16  Yes Lavera Guiseana Duo Liu, MD  Skin Protectants, Misc. (EUCERIN) cream Apply topical to BLE and feet for dry skin twice daily and as needed.   Yes Historical Provider, MD  tamsulosin (FLOMAX) 0.4 MG CAPS capsule Take 0.4 mg by mouth daily.  06/03/16  Yes Historical Provider, MD  Travoprost, BAK Free, (TRAVATAN) 0.004 % SOLN ophthalmic solution Place 1 drop into both eyes at bedtime.   Yes Historical Provider, MD  vitamin B-12 (CYANOCOBALAMIN) 1000 MCG tablet Take one tablet by mouth once daily   Yes Historical Provider, MD  XARELTO 15 MG TABS tablet Take 1 tablet once daily 04/12/16  Yes Historical Provider, MD   BP 129/53 mmHg  Pulse 61  Temp(Src) 99.3 F (37.4 C) (Oral)  Resp 17  Ht 6\' 4"  (1.93 m)  Wt 250 lb (113.399 kg)  BMI 30.44 kg/m2  SpO2 100% Physical Exam  Constitutional: He is oriented to person, place, and time. No distress.  Elderly  HENT:  Head: Normocephalic and atraumatic.  Cardiovascular: Normal rate, regular rhythm and normal heart sounds.   Pulmonary/Chest: Effort normal and breath sounds normal. No respiratory distress. He has no wheezes.  Abdominal: Soft. There is no tenderness. There is no rebound.  Genitourinary: Penis normal.  Foley catheter in place and will draining, there is diffuse dependent swelling of the scrotum, no tenderness, testicles palpable, positive cremasteric reflex, no overlying skin changes or erythema, no crepitus noted  Musculoskeletal: Normal range of motion. He exhibits no edema.  1+ bilateral lower extremities edema  Neurological: He is alert and oriented to person, place, and time.  Skin: Skin is warm and dry.  Psychiatric: He has a normal mood and  affect.  Nursing note and vitals reviewed.   ED Course  Procedures (including critical care time) DIAGNOSTIC STUDIES: Oxygen Saturation is 100% on RA, normal by my interpretation.    COORDINATION OF CARE: 11:24 PM- Pt advised of plan for treatment and pt agrees. Pt will receive lab work for further evaluation. Pt will return for US tomorrow.    Labs Review Labs Reviewed  CBC WITH DIFFERENTIAL/PLATELET - Abnormal; Notable for the following:    RBC 3.30 (*)    Hemoglobin 9.5 (*)    HCT 28.9 (*)    All other components within normal limits  BASIC METABOLIC PANEL - Abnormal; Notable for the following:    Potassium 2.8 (*)    Glucose, Bld 144 (*)    BUN 27 (*)  Creatinine, Ser 1.79 (*)    Calcium 8.3 (*)    GFR calc non Af Amer 34 (*)    GFR calc Af Amer 40 (*)    All other components within normal limits  I-STAT CHEM 8, ED - Abnormal; Notable for the following:    Potassium 2.7 (*)    Chloride 99 (*)    BUN 25 (*)    Creatinine, Ser 1.80 (*)    Glucose, Bld 154 (*)    Calcium, Ion 1.09 (*)    Hemoglobin 10.5 (*)    HCT 31.0 (*)    All other components within normal limits  HEPATIC FUNCTION PANEL    Imaging Review No results found.   Shon Baton, MD has personally reviewed and evaluated these images and lab results as part of her medical decision-making.   EKG Interpretation   Date/Time:  Thursday June 18 2016 01:08:50 EDT Ventricular Rate:  66 PR Interval:    QRS Duration: 166 QT Interval:  435 QTC Calculation: 456 R Axis:   -81 Text Interpretation:  Sinus rhythm Multiple premature complexes, vent &  supraven Short PR interval RBBB and LAFB Probable left ventricular  hypertrophy Continued ectopy Confirmed by Josiyah Tozzi  MD, Tariq Pernell ([id number]) on  06/18/2016 1:30:08 AM      MDM   Final diagnoses:  Acute kidney injury (HCC)  Hypokalemia  Scrotal swelling    Patient presents with a chief complaint of groin swelling. Ongoing for the last 4 days. No  clinical signs or symptoms of infection. No crepitus suggestive for fourniers. This appears to be dependent in nature. Patient reports that he is ambulatory.  Screening lab work was notable for hypokalemia with potassium of 2.7. Also noted to have acute kidney injury with a creatinine of 1.8. Baseline is 1.0. Patient was given 2 runs of IV potassium and oral potassium. He was also given 500 mL of fluid. Additional screening lab work confirms AKI and hypokalemia. EKG shows no evidence of ischemia but he does have ectopy which may be reflective of his metabolites. Given his age, severity of hyperkalemia in the setting of acute kidney injury, will admit for potassium replacement and gentle hydration. Regarding his groin swelling, this appears dependent. Low suspicion at this time for infections.  Patient can have an elective outpatient ultrasound of swelling persists.  Discussed with Dr. Onalee Hua.  Obs, Med-Surg.  I personally performed the services described in this documentation, which was scribed in my presence. The recorded information has been reviewed and is accurate.   Shon Baton, MD 06/18/16 [phone]

## 2016-06-17 NOTE — ED Notes (Signed)
Pt c/o swelling to testicles that started yesterday

## 2016-06-18 ENCOUNTER — Encounter (HOSPITAL_COMMUNITY): Payer: Self-pay

## 2016-06-18 DIAGNOSIS — E43 Unspecified severe protein-calorie malnutrition: Secondary | ICD-10-CM | POA: Diagnosis not present

## 2016-06-18 DIAGNOSIS — N400 Enlarged prostate without lower urinary tract symptoms: Secondary | ICD-10-CM

## 2016-06-18 DIAGNOSIS — I519 Heart disease, unspecified: Secondary | ICD-10-CM | POA: Diagnosis not present

## 2016-06-18 DIAGNOSIS — E876 Hypokalemia: Secondary | ICD-10-CM | POA: Diagnosis present

## 2016-06-18 DIAGNOSIS — N5089 Other specified disorders of the male genital organs: Secondary | ICD-10-CM | POA: Insufficient documentation

## 2016-06-18 DIAGNOSIS — N179 Acute kidney failure, unspecified: Secondary | ICD-10-CM | POA: Insufficient documentation

## 2016-06-18 DIAGNOSIS — N183 Chronic kidney disease, stage 3 (moderate): Secondary | ICD-10-CM

## 2016-06-18 LAB — HEPATIC FUNCTION PANEL
ALT: 8 U/L — AB (ref 17–63)
AST: 13 U/L — ABNORMAL LOW (ref 15–41)
Albumin: 2.8 g/dL — ABNORMAL LOW (ref 3.5–5.0)
Alkaline Phosphatase: 46 U/L (ref 38–126)
BILIRUBIN DIRECT: 0.1 mg/dL (ref 0.1–0.5)
BILIRUBIN INDIRECT: 0.5 mg/dL (ref 0.3–0.9)
TOTAL PROTEIN: 7.2 g/dL (ref 6.5–8.1)
Total Bilirubin: 0.6 mg/dL (ref 0.3–1.2)

## 2016-06-18 LAB — BASIC METABOLIC PANEL
Anion gap: 7 (ref 5–15)
Anion gap: 7 (ref 5–15)
BUN: 25 mg/dL — AB (ref 6–20)
BUN: 27 mg/dL — AB (ref 6–20)
CALCIUM: 8.3 mg/dL — AB (ref 8.9–10.3)
CHLORIDE: 102 mmol/L (ref 101–111)
CO2: 30 mmol/L (ref 22–32)
CO2: 31 mmol/L (ref 22–32)
CREATININE: 1.79 mg/dL — AB (ref 0.61–1.24)
Calcium: 8.1 mg/dL — ABNORMAL LOW (ref 8.9–10.3)
Chloride: 101 mmol/L (ref 101–111)
Creatinine, Ser: 1.65 mg/dL — ABNORMAL HIGH (ref 0.61–1.24)
GFR calc Af Amer: 40 mL/min — ABNORMAL LOW (ref >60)
GFR calc Af Amer: 44 mL/min — ABNORMAL LOW (ref >60)
GFR calc non Af Amer: 38 mL/min — ABNORMAL LOW (ref >60)
GFR, EST NON AFRICAN AMERICAN: 34 mL/min — AB (ref >60)
GLUCOSE: 144 mg/dL — AB (ref 65–99)
GLUCOSE: 273 mg/dL — AB (ref 65–99)
POTASSIUM: 2.9 mmol/L — AB (ref 3.5–5.1)
Potassium: 2.8 mmol/L — ABNORMAL LOW (ref 3.5–5.1)
Sodium: 138 mmol/L (ref 135–145)
Sodium: 140 mmol/L (ref 135–145)

## 2016-06-18 LAB — CBC WITH DIFFERENTIAL/PLATELET
BASOS PCT: 0 %
Basophils Absolute: 0 10*3/uL (ref 0.0–0.1)
Eosinophils Absolute: 0.1 10*3/uL (ref 0.0–0.7)
Eosinophils Relative: 2 %
HEMATOCRIT: 28.9 % — AB (ref 39.0–52.0)
Hemoglobin: 9.5 g/dL — ABNORMAL LOW (ref 13.0–17.0)
Lymphocytes Relative: 24 %
Lymphs Abs: 1.5 10*3/uL (ref 0.7–4.0)
MCH: 28.8 pg (ref 26.0–34.0)
MCHC: 32.9 g/dL (ref 30.0–36.0)
MCV: 87.6 fL (ref 78.0–100.0)
MONO ABS: 0.6 10*3/uL (ref 0.1–1.0)
MONOS PCT: 10 %
NEUTROS ABS: 4 10*3/uL (ref 1.7–7.7)
Neutrophils Relative %: 64 %
Platelets: 183 10*3/uL (ref 150–400)
RBC: 3.3 MIL/uL — ABNORMAL LOW (ref 4.22–5.81)
RDW: 14.7 % (ref 11.5–15.5)
WBC: 6.2 10*3/uL (ref 4.0–10.5)

## 2016-06-18 LAB — I-STAT CHEM 8, ED
BUN: 25 mg/dL — AB (ref 6–20)
CALCIUM ION: 1.09 mmol/L — AB (ref 1.12–1.23)
CREATININE: 1.8 mg/dL — AB (ref 0.61–1.24)
Chloride: 99 mmol/L — ABNORMAL LOW (ref 101–111)
GLUCOSE: 154 mg/dL — AB (ref 65–99)
HCT: 31 % — ABNORMAL LOW (ref 39.0–52.0)
Hemoglobin: 10.5 g/dL — ABNORMAL LOW (ref 13.0–17.0)
Potassium: 2.7 mmol/L — CL (ref 3.5–5.1)
Sodium: 143 mmol/L (ref 135–145)
TCO2: 29 mmol/L (ref 0–100)

## 2016-06-18 LAB — GLUCOSE, CAPILLARY
Glucose-Capillary: 212 mg/dL — ABNORMAL HIGH (ref 65–99)
Glucose-Capillary: 173 mg/dL — ABNORMAL HIGH (ref 65–99)

## 2016-06-18 LAB — CBC
HEMATOCRIT: 29.8 % — AB (ref 39.0–52.0)
Hemoglobin: 9.6 g/dL — ABNORMAL LOW (ref 13.0–17.0)
MCH: 28.7 pg (ref 26.0–34.0)
MCHC: 32.2 g/dL (ref 30.0–36.0)
MCV: 89 fL (ref 78.0–100.0)
Platelets: 185 10*3/uL (ref 150–400)
RBC: 3.35 MIL/uL — ABNORMAL LOW (ref 4.22–5.81)
RDW: 14.3 % (ref 11.5–15.5)
WBC: 6.6 10*3/uL (ref 4.0–10.5)

## 2016-06-18 LAB — MAGNESIUM: Magnesium: 1.6 mg/dL — ABNORMAL LOW (ref 1.7–2.4)

## 2016-06-18 LAB — TSH: TSH: 1.961 u[IU]/mL (ref 0.350–4.500)

## 2016-06-18 MED ORDER — SODIUM CHLORIDE 0.9 % IV BOLUS (SEPSIS)
500.0000 mL | Freq: Once | INTRAVENOUS | Status: AC
  Administered 2016-06-18: 500 mL via INTRAVENOUS

## 2016-06-18 MED ORDER — SODIUM CHLORIDE 0.9% FLUSH
3.0000 mL | Freq: Two times a day (BID) | INTRAVENOUS | Status: DC
  Administered 2016-06-18: 3 mL via INTRAVENOUS

## 2016-06-18 MED ORDER — POTASSIUM CHLORIDE 10 MEQ/100ML IV SOLN
10.0000 meq | Freq: Once | INTRAVENOUS | Status: AC
  Administered 2016-06-18: 10 meq via INTRAVENOUS
  Filled 2016-06-18: qty 100

## 2016-06-18 MED ORDER — OMEGA-3-ACID ETHYL ESTERS 1 G PO CAPS
1.0000 g | ORAL_CAPSULE | Freq: Two times a day (BID) | ORAL | Status: DC
Start: 2016-06-18 — End: 2016-06-19
  Administered 2016-06-18 – 2016-06-19 (×3): 1 g via ORAL
  Filled 2016-06-18 (×9): qty 1

## 2016-06-18 MED ORDER — LEVOTHYROXINE SODIUM 25 MCG PO TABS
137.0000 ug | ORAL_TABLET | Freq: Every day | ORAL | Status: DC
  Administered 2016-06-18 – 2016-06-19 (×2): 137 ug via ORAL
  Filled 2016-06-18 (×2): qty 1

## 2016-06-18 MED ORDER — POTASSIUM CHLORIDE CRYS ER 20 MEQ PO TBCR
40.0000 meq | EXTENDED_RELEASE_TABLET | Freq: Once | ORAL | Status: AC
  Administered 2016-06-18: 40 meq via ORAL
  Filled 2016-06-18: qty 2

## 2016-06-18 MED ORDER — MAGNESIUM SULFATE 2 GM/50ML IV SOLN
2.0000 g | Freq: Once | INTRAVENOUS | Status: AC
  Administered 2016-06-18: 2 g via INTRAVENOUS
  Filled 2016-06-18: qty 50

## 2016-06-18 MED ORDER — TAMSULOSIN HCL 0.4 MG PO CAPS
0.4000 mg | ORAL_CAPSULE | Freq: Every day | ORAL | Status: DC
  Administered 2016-06-18 – 2016-06-19 (×2): 0.4 mg via ORAL
  Filled 2016-06-18 (×2): qty 1

## 2016-06-18 MED ORDER — RIVAROXABAN 15 MG PO TABS
15.0000 mg | ORAL_TABLET | Freq: Every day | ORAL | Status: DC
  Administered 2016-06-18 – 2016-06-19 (×2): 15 mg via ORAL
  Filled 2016-06-18 (×2): qty 1

## 2016-06-18 MED ORDER — DILTIAZEM HCL ER COATED BEADS 180 MG PO CP24
300.0000 mg | ORAL_CAPSULE | Freq: Every day | ORAL | Status: DC
  Administered 2016-06-18 – 2016-06-19 (×2): 300 mg via ORAL
  Filled 2016-06-18 (×2): qty 1

## 2016-06-18 MED ORDER — SODIUM CHLORIDE 0.9 % IV SOLN: 250.0000 mL | INTRAVENOUS | Status: DC | PRN

## 2016-06-18 MED ORDER — SODIUM CHLORIDE 0.9 % IV SOLN
INTRAVENOUS | Status: DC
  Filled 2016-06-18 (×3): qty 1000

## 2016-06-18 MED ORDER — CEPHALEXIN 500 MG PO CAPS
500.0000 mg | ORAL_CAPSULE | Freq: Four times a day (QID) | ORAL | Status: DC
  Administered 2016-06-18 – 2016-06-19 (×6): 500 mg via ORAL
  Filled 2016-06-18 (×6): qty 1

## 2016-06-18 MED ORDER — INSULIN DETEMIR 100 UNIT/ML ~~LOC~~ SOLN
20.0000 [IU] | Freq: Every day | SUBCUTANEOUS | Status: DC
  Administered 2016-06-18: 20 [IU] via SUBCUTANEOUS
  Filled 2016-06-18 (×4): qty 0.2

## 2016-06-18 MED ORDER — ENSURE ENLIVE PO LIQD
237.0000 mL | Freq: Two times a day (BID) | ORAL | Status: DC
  Administered 2016-06-18 – 2016-06-19 (×3): 237 mL via ORAL
  Filled 2016-06-18 (×8): qty 237

## 2016-06-18 MED ORDER — POLYETHYLENE GLYCOL 3350 17 G PO PACK
17.0000 g | PACK | Freq: Two times a day (BID) | ORAL | Status: DC
  Administered 2016-06-18 (×2): 17 g via ORAL
  Filled 2016-06-18 (×3): qty 1

## 2016-06-18 MED ORDER — SODIUM CHLORIDE 0.9% FLUSH: 3.0000 mL | INTRAVENOUS | Status: DC | PRN

## 2016-06-18 MED ORDER — ALPRAZOLAM 1 MG PO TABS: 1.0000 mg | ORAL_TABLET | Freq: Every evening | ORAL | Status: DC | PRN

## 2016-06-18 MED ORDER — CITALOPRAM HYDROBROMIDE 20 MG PO TABS
20.0000 mg | ORAL_TABLET | Freq: Every day | ORAL | Status: DC
Start: 2016-06-18 — End: 2016-06-19
  Administered 2016-06-18 – 2016-06-19 (×2): 20 mg via ORAL
  Filled 2016-06-18 (×2): qty 1

## 2016-06-18 MED ORDER — FUROSEMIDE 20 MG PO TABS
20.0000 mg | ORAL_TABLET | Freq: Every day | ORAL | Status: DC
  Administered 2016-06-18 – 2016-06-19 (×2): 20 mg via ORAL
  Filled 2016-06-18 (×3): qty 1

## 2016-06-18 MED ORDER — POTASSIUM CHLORIDE CRYS ER 20 MEQ PO TBCR: 40.0000 meq | EXTENDED_RELEASE_TABLET | Freq: Once | ORAL | Status: DC

## 2016-06-18 MED ORDER — INSULIN ASPART 100 UNIT/ML ~~LOC~~ SOLN
0.0000 [IU] | Freq: Three times a day (TID) | SUBCUTANEOUS | Status: DC
  Administered 2016-06-18: 5 [IU] via SUBCUTANEOUS
  Administered 2016-06-19: 8 [IU] via SUBCUTANEOUS
  Administered 2016-06-19 (×2): 2 [IU] via SUBCUTANEOUS

## 2016-06-18 MED ORDER — INSULIN ASPART 100 UNIT/ML ~~LOC~~ SOLN: 0.0000 [IU] | Freq: Every day | SUBCUTANEOUS | Status: DC

## 2016-06-18 MED ORDER — POTASSIUM CHLORIDE 20 MEQ PO PACK
40.0000 meq | PACK | Freq: Two times a day (BID) | ORAL | Status: DC
  Administered 2016-06-18 – 2016-06-19 (×3): 40 meq via ORAL
  Filled 2016-06-18 (×4): qty 2

## 2016-06-18 MED ORDER — SENNA 8.6 MG PO TABS
1.0000 | ORAL_TABLET | Freq: Every day | ORAL | Status: DC
  Administered 2016-06-18 – 2016-06-19 (×2): 8.6 mg via ORAL
  Filled 2016-06-18 (×2): qty 1

## 2016-06-18 NOTE — Care Management Obs Status (Signed)
MEDICARE OBSERVATION STATUS NOTIFICATION   Patient Details  Name: Garrett PanderJames C Beem MRN: [id number] Date of Birth: 04/04/1934   Medicare Observation Status Notification Given:  Yes    Malcolm MetroChildress, Shyne Resch Demske, RN 06/18/2016, 2:11 PM

## 2016-06-18 NOTE — Progress Notes (Signed)
Patient admitted to the hospital earlier this morning by Dr. Onalee Huaavid.  Patient seen and examined. He is feeling better. Feels swelling in legs and scrotum is improving. No tenderness on exam  He has been admitted with AKI, felt to be related to excessive lasix use. He is also hypokalemia felt to be related to lasix. His po intake has been poor the last several days since the people that normally prepare his food have been unable to do so. This is likely contributing to above issues. Will provide gentle hydration overnight and replace potassium. He has chronic bladder outlet obstruction and intermittently self catheterizes himself.   Anticipate discharge home tomorrow if labs are improved.  Garrett Bradley

## 2016-06-18 NOTE — Progress Notes (Signed)
Initial Nutrition Assessment  DOCUMENTATION CODES:  Obese     INTERVENTION:  Ensure Enlive po BID, each supplement provides 350 kcal and 20 grams of protein   NUTRITION DIAGNOSIS:  Inadequate oral intake related to changes in usual meal pattern AEB pt report of change in usual food choices due to family sickness and his unplanned wt loss 7% in 60 d.  GOAL: Pt to meet >/= 90% of their estimated nutrition needs      MONITOR: Po intake, labs and wt trends    REASON FOR ASSESSMENT:   Consult Assessment of nutrition requirement/status  ASSESSMENT:   Patient is an 82 y.o. male with medical history significant of BPH, chronic indwelling foley, htn, dm comes [facility] abnormal lab k level. Pt lives alone and has been having a indwelling foley for couple of months due to BPH. He has been having some scotal swelling and le edema for over a week now with no sob. No fevers. Pt eats poorly. Per son reports he eats tv dinners and does not watch his salt intake. Patient says he's been eating frozen meals more due to changes in family assistance with care. He says they have been sick themselves and not able to help him. Expect that he is choosing foods of convienence due to limited functional capacity. He ambulates with a walker and lives alone. He has experienced significant amount of unplanned weight loss 7% in 60 days.  His appetite reported as good and currently he's eating 50-65% of his meals and drinking 100% of chocolate Ensure. Expect his weight loss contributed at least in part to the change in his availability of care assistance. We talked about foods he could prepare in the crock pot which would help him decreased his sodium intake and increase the protein he gets daily. Provided and reviewed several handouts with him such as: 30 ways to high protein, List of Sodium Content of common foods, 5 Facts about Malnutrition and MyPlate for Older Adults.  NFPE:  mild muscle wasting expected for his age, ample fat mass. Lower extremity edema (mild).  Recent Labs Lab 06/17/16 2341 06/18/16 0030 06/18/16 0551  NA 143 138 140  K 2.7* 2.8* 2.9*  CL 99* 101 102  CO2  --  30 31  BUN 25* 27* 25*  CREATININE 1.80* 1.79* 1.65*  CALCIUM  --  8.3* 8.1*  MG  --   --  1.6*  GLUCOSE 154* 144* 273*   Labs: hypokalemia, hyperglycemia    Diet Order:  Diet Heart Room service appropriate?: Yes; Fluid consistency:: Thin  Skin:  Reviewed, no issues  Last BM:  7/11   Height:   Ht Readings from Last 1 Encounters:  06/17/16 6\' 4"  (1.93 m)    Weight:   Wt Readings from Last 1 Encounters:  06/17/16 250 lb (113.399 kg)    Ideal Body Weight:  92 kg  BMI:  Body mass index is 30.44 kg/(m^2).  Estimated Nutritional Needs:   Kcal:  2100-2200  Protein:  138 gr  Fluid:  2.1-2.2 liters daily  EDUCATION NEEDS: addressed- noted above    Royann ShiversLynn Josie Mesa MS,RD,CSG,LDN Office: [phone] Pager: [phone]

## 2016-06-18 NOTE — Consult Note (Signed)
   [facility]   06/18/2016  Garrett Bradley 03/04/1934 [id number]  Spoke with patient at bedside [facility]. [facility] not [facility] at [facility]. Patient given [facility] brochure and contact information for future reference.  Of note, [facility] Care Management services would not replace or interfere with any services that are arranged by inpatient case management or social work. For additional questions or referrals please contact:  Alben SpittleMary E. Albertha GheeNiemczura, RN, BSN, [facility]  [facility] Hospital Liaison [phone]

## 2016-06-18 NOTE — Care Management Note (Signed)
Case Management Note  Patient Details  Name: Garrett Bradley MRN: [id number] Date of Birth: 03/07/1934  Subjective/Objective:                  Pt is from home, lives alone and is ind with ADL's. Pt has PCP, transportation to appointments and no difficulty affording medications. Pt ambulates with a walker, which is at his bedside. [facility]. Pt plans to return home with resumption of HH services.   Action/Plan: Will cont to follow and will notify [facility] when pt discharges.   Expected Discharge Date:      06/19/2016            Expected Discharge Plan:  Home w Home Health Services  In-House Referral:  NA  Discharge planning Services  CM Consult  Post Acute Care Choice:  Home Health, Resumption of Svcs/PTA Provider Choice offered to:  Patient  DME Arranged:    DME Agency:     HH Arranged:  RN [facility]  Status of Service:  In process, will continue to follow  If discussed at Long Length of Stay Meetings, dates discussed:    Additional Comments:  Malcolm MetroChildress, Jamerica Snavely Demske, RN 06/18/2016, 2:35 PM

## 2016-06-18 NOTE — Progress Notes (Signed)
Inpatient Diabetes Program Recommendations  AACE/ADA: New Consensus Statement on Inpatient Glycemic Control (2015)  Target Ranges:  Prepandial:   less than 140 mg/dL      Peak postprandial:   less than 180 mg/dL (1-2 hours)      Critically ill patients:  140 - 180 mg/dL   Results for Garrett Bradley, Garrett Bradley (MRN [id number]) as of 06/18/2016 08:48  Ref. Range 06/17/2016 23:41 06/18/2016 00:30 06/18/2016 05:51  Glucose Latest Ref Range: 65-99 mg/dL 409154 (H) 811144 (H) 914273 (H)   Review of Glycemic Control  Diabetes history: DM2 Outpatient Diabetes medications: Levemir 20 units QHS Current orders for Inpatient glycemic control: Levemir 20 units QHS  Inpatient Diabetes Program Recommendations: Insulin - Basal: Noted no basal given last night and fasting glucose 273 mg/dl this morning. Please consider changing frequency of Levemir to daily so that patient will receive this morning. Correction (SSI): Please consider ordering CBGS with Novolog correction scale ACHS.  Thanks, Orlando PennerMarie Lamerle Jabs, RN, MSN, CDE Diabetes Coordinator Inpatient Diabetes Program [phone] (Team Pager from 8am to 5pm) [phone] ([facility] office) [phone] [facility] office) [phone] [facility] office)

## 2016-06-18 NOTE — H&P (Signed)
History and Physical    Garrett Bradley ZOX:[id number] DOB: 08/08/1934 DOA: 06/17/2016  PCP: Colette Ribas, MD  Patient coming from: home  Chief Complaint:  Low potassium level  HPI: Garrett Bradley is a 82 y.o. male with medical history significant of BPH, chronic indwelling foley, htn, dm comes in at the advisement of his [facility] agency for abnormal lab k level.  Pt lives alone and has been having a indwelling foley for couple of months due to BPH.  He has been having some scotal swelling and le edema for over a week now with no sob.  No fevers.  Pt eats poorly.  Per son reports he eats tv dinners and does not watch his salt intake.  Pt per med list is suppose to take lasix prn but he says he has increased this to two to three times a day in the last week but he is not sure.  Pt denies any scrotal pain, or testicular pain althought there is some swelling bilaterally.  Pt referred for admission for low k level and  AKI.    ED Course: 500cc ivf bolus.  kcl iv.  Review of Systems: As per HPI otherwise 10 point review of systems negative.   Past Medical History  Diagnosis Date  . Hypertension   . Diabetes mellitus   . Hypothyroidism   . Depression   . Enlarged prostate     Status post TURP; self caths @ home    Past Surgical History  Procedure Laterality Date  . Hernia repair    . Transurethral resection of prostate  09/22/2011    Procedure: TRANSURETHRAL RESECTION OF THE PROSTATE (TURP);  Surgeon: Ky Barban;  Location: [facility];  Service: Urology;  Laterality: N/A;     reports that he has never smoked. He has never used smokeless tobacco. He reports that he does not drink alcohol or use illicit drugs.  Allergies  Allergen Reactions  . Thiazide-Type Diuretics     03/2016 acute wrist pain, presumed gout Uric acid 10.4    Family History  Problem Relation Age of Onset  . Hypertension Mother   . Cerebral aneurysm Mother   . Hypertension Brother   . Heart disease Father      Heart attack  . Cancer Neg Hx   . Diabetes Neg Hx     only among cousins    Prior to Admission medications   Medication Sig Start Date End Date Taking? Authorizing Provider  acetaminophen (TYLENOL) 325 MG tablet Take two tablets by mouth every 4 hours as needed for pain/temp   Yes Historical Provider, MD  ALPRAZolam (XANAX) 1 MG tablet Take 1 tablet (1 mg total) by mouth at bedtime as needed for sleep. 03/23/16  Yes Standley Brooking, MD  cephALEXin (KEFLEX) 500 MG capsule Take 1 capsule (500 mg total) by mouth 4 (four) times daily. 06/07/16  Yes Eber Hong, MD  citalopram (CELEXA) 20 MG tablet Take 1 tablet (20 mg total) by mouth daily. 03/23/16  Yes Standley Brooking, MD  clobetasol ointment (TEMOVATE) 0.05 % Apply 1 application topically daily as needed. For rash on legs 02/09/14  Yes Historical Provider, MD  diltiazem (CARDIZEM CD) 300 MG 24 hr capsule Take 300 mg by mouth daily.    Yes Historical Provider, MD  fish oil-omega-3 fatty acids 1000 MG capsule Take 1 capsule (1 g total) by mouth 2 (two) times daily. 10/02/11  Yes Christiane Ha, MD  furosemide (LASIX) 20  MG tablet TAKE 20 MG DAILY AS NEEDED FOR LEG SWELLING. 04/30/16  Yes Laqueta LindenSuresh A Koneswaran, MD  GLUCERNA (GLUCERNA) LIQD Take 237 mLs by mouth 2 (two) times daily between meals.   Yes Historical Provider, MD  insulin detemir (LEVEMIR) 100 UNIT/ML injection Inject 20 Units into the skin at bedtime.    Yes Historical Provider, MD  levothyroxine (SYNTHROID, LEVOTHROID) 137 MCG tablet Take 137 mcg by mouth daily. 01/30/16  Yes Historical Provider, MD  loperamide (IMODIUM) 2 MG capsule Take 2 tablet by mouth daily as needed   Yes Historical Provider, MD  polyethylene glycol (MIRALAX / GLYCOLAX) packet Take 17 g by mouth 2 (two) times daily. Give until he has daily soft stools.   Yes Historical Provider, MD  Potassium Chloride ER 20 MEQ TBCR Take 1 tablet by mouth twice a day   Yes Historical Provider, MD  senna (SENOKOT) 8.6 MG TABS  tablet Take 1 tablet (8.6 mg total) by mouth daily. 03/11/16  Yes Lavera Guiseana Duo Liu, MD  Skin Protectants, Misc. (EUCERIN) cream Apply topical to BLE and feet for dry skin twice daily and as needed.   Yes Historical Provider, MD  tamsulosin (FLOMAX) 0.4 MG CAPS capsule Take 0.4 mg by mouth daily.  06/03/16  Yes Historical Provider, MD  Travoprost, BAK Free, (TRAVATAN) 0.004 % SOLN ophthalmic solution Place 1 drop into both eyes at bedtime.   Yes Historical Provider, MD  vitamin B-12 (CYANOCOBALAMIN) 1000 MCG tablet Take one tablet by mouth once daily   Yes Historical Provider, MD  XARELTO 15 MG TABS tablet Take 1 tablet once daily 04/12/16  Yes Historical Provider, MD    Physical Exam: Filed Vitals:   06/17/16 2007 06/17/16 2155 06/18/16 0130  BP: 111/42 129/53 147/83  Pulse: 77 61 71  Temp: 99.3 F (37.4 C)    TempSrc: Oral    Resp: 18 17 0  Height: 6\' 4"  (1.93 m)    Weight: 113.399 kg (250 lb)    SpO2: 97% 100% 97%      Constitutional: NAD, calm, comfortable Filed Vitals:   06/17/16 2007 06/17/16 2155 06/18/16 0130  BP: 111/42 129/53 147/83  Pulse: 77 61 71  Temp: 99.3 F (37.4 C)    TempSrc: Oral    Resp: 18 17 0  Height: 6\' 4"  (1.93 m)    Weight: 113.399 kg (250 lb)    SpO2: 97% 100% 97%   Eyes: PERRL, lids and conjunctivae normal ENMT: Mucous membranes are moist. Posterior pharynx clear of any exudate or lesions.Normal dentition.  Neck: normal, supple, no masses, no thyromegaly Respiratory: clear to auscultation bilaterally, no wheezing, no crackles. Normal respiratory effort. No accessory muscle use.  Cardiovascular: Regular rate and rhythm, no murmurs / rubs / gallops. extremity edema pitting ble. 2+ pedal pulses. No carotid bruits.  Abdomen: no tenderness, no masses palpated. No hepatosplenomegaly. Bowel sounds positive.  Musculoskeletal: no clubbing / cyanosis. No joint deformity upper and lower extremities. Good ROM, no contractures. Normal muscle tone.  Skin: no rashes,  lesions, ulcers. No induration, dependent scrotal edema noted Neurologic: CN 2-12 grossly intact. Sensation intact, DTR normal. Strength 5/5 in all 4.  Psychiatric: Normal judgment and insight. Alert and oriented x 3. Normal mood.    Labs on Admission: I have personally reviewed following labs and imaging studies  CBC:  Recent Labs Lab 06/17/16 2341 06/18/16 0030  WBC  --  6.2  NEUTROABS  --  4.0  HGB 10.5* 9.5*  HCT 31.0* 28.9*  MCV  --  87.6  PLT  --  183   Basic Metabolic Panel:  Recent Labs Lab 06/17/16 2341 06/18/16 0030  NA 143 138  K 2.7* 2.8*  CL 99* 101  CO2  --  30  GLUCOSE 154* 144*  BUN 25* 27*  CREATININE 1.80* 1.79*  CALCIUM  --  8.3*   GFR: Estimated Creatinine Clearance: 45.3 mL/min (by C-G formula based on Cr of 1.79). Liver Function Tests:  Recent Labs Lab 06/18/16 0140  AST 13*  ALT 8*  ALKPHOS 46  BILITOT 0.6  PROT 7.2  ALBUMIN 2.8*   No results for input(s): LIPASE, AMYLASE in the last 168 hours. No results for input(s): AMMONIA in the last 168 hours. Coagulation Profile: No results for input(s): INR, PROTIME in the last 168 hours. Cardiac Enzymes: No results for input(s): CKTOTAL, CKMB, CKMBINDEX, TROPONINI in the last 168 hours. BNP (last 3 results) No results for input(s): PROBNP in the last 8760 hours. HbA1C: No results for input(s): HGBA1C in the last 72 hours. CBG: No results for input(s): GLUCAP in the last 168 hours. Lipid Profile: No results for input(s): CHOL, HDL, LDLCALC, TRIG, CHOLHDL, LDLDIRECT in the last 72 hours. Thyroid Function Tests: No results for input(s): TSH, T4TOTAL, FREET4, T3FREE, THYROIDAB in the last 72 hours. Anemia Panel: No results for input(s): VITAMINB12, FOLATE, FERRITIN, TIBC, IRON, RETICCTPCT in the last 72 hours. Urine analysis:    Component Value Date/Time   COLORURINE YELLOW 06/07/2016 0611   APPEARANCEUR CLOUDY* 06/07/2016 0611   LABSPEC 1.010 06/07/2016 0611   PHURINE 8.5*  06/07/2016 0611   GLUCOSEU NEGATIVE 06/07/2016 0611   HGBUR LARGE* 06/07/2016 0611   BILIRUBINUR NEGATIVE 06/07/2016 0611   KETONESUR NEGATIVE 06/07/2016 0611   PROTEINUR 100* 06/07/2016 0611   UROBILINOGEN 0.2 07/28/2014 0736   NITRITE NEGATIVE 06/07/2016 0611   LEUKOCYTESUR MODERATE* 06/07/2016 0611   Sepsis Labs: !!!!!!!!!!!!!!!!!!!!!!!!!!!!!!!!!!!!!!!!!!!! (procalcitonin:4,lacticidven:4) )No results found for this or any previous visit (from the past 240 hour(s)).   Radiological Exams on Admission: No results found.  Assessment/Plan 82 yo male with AKI, hypokalemia, malnourishement and dependent edema in legs and scrotum due to poor nutritional effort and salt intake  Principal Problem:   Hypokalemia- likely due to recent increase in his diuretics.  Replete through iv route.  Check mag level.  Active Problems:   AKI (acute kidney injury) (HCC)- again likely due to increase in lasix at home, given 500cc ivf bolus in ED.  Place on lasix  daily po and clarify what he is really taking in the am.   BPH (benign prostatic hyperplasia)- noted, follow up with urology already scheduled in next couple of weeks   CKD (chronic kidney disease) stage 3, GFR 30-59 ml/min- noted, cr up to 1.7.  Decrease diuretics, given fluid bolus, repeat in am   Diabetes mellitus type 2 with complications (HCC)- noted, cont home regimen   Protein-calorie malnutrition, severe- alb is 2.8.  Obtain dietician consult   Diastolic dysfunction- noted   Scrotal swelling bilateral - mild, no redness, nonpainful on exam.  Due to dependent edema.  Low salt diet.  Dietician consult.   obs on medical.    DVT prophylaxis: scds Code Status: full code  Bryana Froemming A MD [facility]  If 7PM-7AM, please contact night-coverage [email or web address] Password [email or web address]  06/18/2016, 2:21 AM

## 2016-06-19 DIAGNOSIS — N179 Acute kidney failure, unspecified: Secondary | ICD-10-CM | POA: Diagnosis not present

## 2016-06-19 DIAGNOSIS — N400 Enlarged prostate without lower urinary tract symptoms: Secondary | ICD-10-CM | POA: Diagnosis not present

## 2016-06-19 DIAGNOSIS — N183 Chronic kidney disease, stage 3 (moderate): Secondary | ICD-10-CM | POA: Diagnosis not present

## 2016-06-19 DIAGNOSIS — E876 Hypokalemia: Secondary | ICD-10-CM | POA: Diagnosis not present

## 2016-06-19 LAB — BASIC METABOLIC PANEL
Anion gap: 6 (ref 5–15)
BUN: 19 mg/dL (ref 6–20)
CHLORIDE: 105 mmol/L (ref 101–111)
CO2: 30 mmol/L (ref 22–32)
CREATININE: 1.16 mg/dL (ref 0.61–1.24)
Calcium: 8.3 mg/dL — ABNORMAL LOW (ref 8.9–10.3)
GFR calc Af Amer: >60 mL/min (ref >60)
GFR, EST NON AFRICAN AMERICAN: 58 mL/min — AB (ref >60)
Glucose, Bld: 147 mg/dL — ABNORMAL HIGH (ref 65–99)
Potassium: 3 mmol/L — ABNORMAL LOW (ref 3.5–5.1)
SODIUM: 141 mmol/L (ref 135–145)

## 2016-06-19 LAB — GLUCOSE, CAPILLARY
GLUCOSE-CAPILLARY: 137 mg/dL — AB (ref 65–99)
GLUCOSE-CAPILLARY: 285 mg/dL — AB (ref 65–99)
Glucose-Capillary: 135 mg/dL — ABNORMAL HIGH (ref 65–99)

## 2016-06-19 MED ORDER — FUROSEMIDE 20 MG PO TABS: ORAL_TABLET | ORAL | Status: DC

## 2016-06-19 MED ORDER — POTASSIUM CHLORIDE CRYS ER 20 MEQ PO TBCR
40.0000 meq | EXTENDED_RELEASE_TABLET | ORAL | Status: AC
  Administered 2016-06-19 (×2): 40 meq via ORAL
  Filled 2016-06-19 (×2): qty 2

## 2016-06-19 MED ORDER — POTASSIUM CHLORIDE IN NACL 40-0.9 MEQ/L-% IV SOLN
INTRAVENOUS | Status: DC
  Administered 2016-06-19: 100 mL/h via INTRAVENOUS

## 2016-06-19 NOTE — Progress Notes (Signed)
Patient's IV removed.  Site WNL.  AVS reviewed with patient.  Patient verbalized understanding of discharge instructions, physician follow-up, medications. Patient reports belongings intact and in possession at time of discharge.  Patient stable and awaiting son for transport home.

## 2016-06-19 NOTE — Care Management Note (Signed)
Case Management Note  Patient Details  Name: Charlynne PanderJames C Reaney MRN: [id number] Date of Birth: 02/11/1934     Expected Discharge Date:        06/19/2016          Expected Discharge Plan:  Home w Home Health Services  In-House Referral:  NA  Discharge planning Services  CM Consult  Post Acute Care Choice:  Home Health, Resumption of Svcs/PTA Provider Choice offered to:  Patient  DME Arranged:    DME Agency:     HH Arranged:  RN HH [facility]  Status of Service:  Completed, signed off  If discussed at [employer] Length of Stay Meetings, dates discussed:    Additional Comments: Patient discharging today with resumption of home health services. [employer] contacted, will call patient but they have a year's waitlist to receive meals. [facility] notified of discharge.   Kengo Sturges, Chrystine OilerSharley Diane, RN 06/19/2016, 3:16 PM

## 2016-06-19 NOTE — Progress Notes (Signed)
Patient transported by NT via wheelchair to main entrance for discharge.  Patient's foley catheter intact.  Home care to resume with [facility].  Patient stable at time of discharge.

## 2016-06-19 NOTE — Discharge Summary (Signed)
Physician Discharge Summary  Garrett Bradley:[id number] DOB: 29-Dec-1933 DOA: 06/17/2016  PCP: Colette Ribas, MD  Admit date: 06/17/2016 Discharge date: 06/19/2016   Admitted From: Home Disposition:  Home  Recommendations for Outpatient Follow-up:  1. Follow up with PCP in 1-2 weeks 2. Please obtain BMP/CBC in one week 3. Follow up with out patient urologist as plan 7/26.  Home Health: Yes Equipment/Devices: None  Discharge Condition: Stable CODE STATUS: FULL Diet recommendation: Heart Healthy  Brief/Interim Summary: 82 yom was admitted for scrotal swelling and LE edema. Patient was referred to the ED by home health agency for abnormally low potassium. While in the ED he was found to be hypokalemic and was noted to have AKI. Patient was referred for admission.   Discharge Diagnoses:  Principal Problem:   Hypokalemia Active Problems:   BPH (benign prostatic hyperplasia)   CKD (chronic kidney disease) stage 3, GFR 30-59 ml/min   Diabetes mellitus type 2 with complications (HCC)   Protein-calorie malnutrition, severe   AKI (acute kidney injury) (HCC)   DVT (deep venous thrombosis) (HCC)   Diastolic dysfunction   Acute kidney injury (HCC)   During evaluation in the ED, patient was noted to be hypokalemic. Hypokalemia felt to be due to excessive lasix use. On admission he was switched to a low dose lasix and was placed on gentle hydration. On discharge his potassium was noted to have improved. On discharge his lasix was switched to 40mg  once a day as needed. 1. BPH. Patient presented with complaints of scrotal swelling and LE edema. He was given lasix in the hospital and his edema has improved. Instructed to follow-up with outpatient urologist as planned. Continue flomax.  2. Protein calorie malnutrition. Nutritionist following. Patient states he has difficulty obtaining food as outpatient. SW will provided food source options 3. AKI on CKD stage 3.Likely due to excessive  lasix. He was placed on a low-dose lasix and  provided with gentle hydration. Creatinine noted to have improved with treatment.  4. DM type 2. Blood sugars stable.    Discharge Instructions      Discharge Instructions    Diet - low sodium heart healthy    Complete by:  As directed      Increase activity slowly    Complete by:  As directed             Medication List    TAKE these medications        acetaminophen 325 MG tablet  Commonly known as:  TYLENOL  Take two tablets by mouth every 4 hours as needed for pain/temp     ALPRAZolam 1 MG tablet  Commonly known as:  XANAX  Take 1 tablet (1 mg total) by mouth at bedtime as needed for sleep.     cephALEXin 500 MG capsule  Commonly known as:  KEFLEX  Take 1 capsule (500 mg total) by mouth 4 (four) times daily.     citalopram 20 MG tablet  Commonly known as:  CELEXA  Take 1 tablet (20 mg total) by mouth daily.     clobetasol ointment 0.05 %  Commonly known as:  TEMOVATE  Apply 1 application topically daily as needed. For rash on legs     diltiazem 300 MG 24 hr capsule  Commonly known as:  CARDIZEM CD  Take 300 mg by mouth daily.     eucerin cream  Apply topical to BLE and feet for dry skin twice daily and as needed.  fish oil-omega-3 fatty acids 1000 MG capsule  Take 1 capsule (1 g total) by mouth 2 (two) times daily.     furosemide 20 MG tablet  Commonly known as:  LASIX  TAKE 40 MG DAILY AS NEEDED FOR LEG SWELLING.     GLUCERNA Liqd  Take 237 mLs by mouth 2 (two) times daily between meals.     insulin detemir 100 UNIT/ML injection  Commonly known as:  LEVEMIR  Inject 20 Units into the skin at bedtime.     levothyroxine 137 MCG tablet  Commonly known as:  SYNTHROID, LEVOTHROID  Take 137 mcg by mouth daily.     loperamide 2 MG capsule  Commonly known as:  IMODIUM  Take 2 tablet by mouth daily as needed     polyethylene glycol packet  Commonly known as:  MIRALAX / GLYCOLAX  Take 17 g by mouth 2  (two) times daily. Give until he has daily soft stools.     Potassium Chloride ER 20 MEQ Tbcr  Take 1 tablet by mouth twice a day     senna 8.6 MG Tabs tablet  Commonly known as:  SENOKOT  Take 1 tablet (8.6 mg total) by mouth daily.     tamsulosin 0.4 MG Caps capsule  Commonly known as:  FLOMAX  Take 0.4 mg by mouth daily.     Travoprost (BAK Free) 0.004 % Soln ophthalmic solution  Commonly known as:  TRAVATAN  Place 1 drop into both eyes at bedtime.     vitamin B-12 1000 MCG tablet  Commonly known as:  CYANOCOBALAMIN  Take one tablet by mouth once daily     XARELTO 15 MG Tabs tablet  Generic drug:  Rivaroxaban  Take 1 tablet once daily       Follow-up Information    [facility]   Contact information:   [address] [place] [id number] [phone]      Allergies  Allergen Reactions  . Thiazide-Type Diuretics     03/2016 acute wrist pain, presumed gout Uric acid 10.4    Consultations:  None    Procedures/Studies: No results found.     Subjective: Feels well. Swelling and breathing are well. Noticeable improvement in scrotal swelling with placement of cath.   Discharge Exam: Filed Vitals:   06/19/16 0600 06/19/16 1028  BP: 139/65   Pulse: 57 60  Temp: 98.8 F (37.1 C)   Resp: 18    Filed Vitals:   06/18/16 1952 06/18/16 2141 06/19/16 0600 06/19/16 1028  BP:  132/70 139/65   Pulse:  61 57 60  Temp:  98.7 F (37.1 C) 98.8 F (37.1 C)   TempSrc:  Oral Oral   Resp:  20 18   Height:      Weight:      SpO2: 97% 98% 100%     General: Pt is alert, awake, not in acute distress Cardiovascular: RRR, S1/S2 +, no rubs, no gallops Respiratory: CTA bilaterally, no wheezing, no rhonchi Abdominal: Soft, NT, ND, bowel sounds + Extremities: trace edema BLE scrotal edema is improving, no cyanosis    The results of significant diagnostics from this hospitalization (including imaging, microbiology, ancillary and  laboratory) are listed below for reference.     Microbiology: No results found for this or any previous visit (from the past 240 hour(s)).   Labs: BNP (last 3 results)  Recent Labs  04/06/16 1440  BNP 113.0*   Basic Metabolic Panel:  Recent  Labs Lab 06/17/16 2341 06/18/16 0030 06/18/16 0551 06/19/16 0503  NA 143 138 140 141  K 2.7* 2.8* 2.9* 3.0*  CL 99* 101 102 105  CO2  --  GLUCOSE 154* 144* 273* 147*  BUN 25* 27* 25* 19  CREATININE 1.80* 1.79* 1.65* 1.16  CALCIUM  --  8.3* 8.1* 8.3*  MG  --   --  1.6*  --    Liver Function Tests:  Recent Labs Lab 06/18/16 0140  AST 13*  ALT 8*  ALKPHOS 46  BILITOT 0.6  PROT 7.2  ALBUMIN 2.8*   No results for input(s): LIPASE, AMYLASE in the last 168 hours. No results for input(s): AMMONIA in the last 168 hours. CBC:  Recent Labs Lab 06/17/16 2341 06/18/16 0030 06/18/16 0551  WBC  --  6.2 6.6  NEUTROABS  --  4.0  --   HGB 10.5* 9.5* 9.6*  HCT 31.0* 28.9* 29.8*  MCV  --  87.6 89.0  PLT  --  183 185   Cardiac Enzymes: No results for input(s): CKTOTAL, CKMB, CKMBINDEX, TROPONINI in the last 168 hours. BNP: Invalid input(s): POCBNP CBG:  Recent Labs Lab 06/18/16 1732 06/18/16 2132 06/19/16 0817 06/19/16 1114  GLUCAP 212* 173* 137* 285*   D-Dimer No results for input(s): DDIMER in the last 72 hours. Hgb A1c No results for input(s): HGBA1C in the last 72 hours. Lipid Profile No results for input(s): CHOL, HDL, LDLCALC, TRIG, CHOLHDL, LDLDIRECT in the last 72 hours. Thyroid function studies  Recent Labs  06/18/16 0140  TSH 1.961   Anemia work up No results for input(s): VITAMINB12, FOLATE, FERRITIN, TIBC, IRON, RETICCTPCT in the last 72 hours. Urinalysis    Component Value Date/Time   COLORURINE YELLOW 06/07/2016 0611   APPEARANCEUR CLOUDY* 06/07/2016 0611   LABSPEC 1.010 06/07/2016 0611   PHURINE 8.5* 06/07/2016 0611   GLUCOSEU NEGATIVE 06/07/2016 0611   HGBUR LARGE* 06/07/2016  0611   BILIRUBINUR NEGATIVE 06/07/2016 0611   KETONESUR NEGATIVE 06/07/2016 0611   PROTEINUR 100* 06/07/2016 0611   UROBILINOGEN 0.2 07/28/2014 0736   NITRITE NEGATIVE 06/07/2016 0611   LEUKOCYTESUR MODERATE* 06/07/2016 0611   Sepsis Labs Invalid input(s): PROCALCITONIN,  WBC,  LACTICIDVEN Microbiology No results found for this or any previous visit (from the past 240 hour(s)).   Time coordinating discharge: Over 30 minutes  SIGNED:   Erick Blinks, MD  [facility] 06/19/2016, 1:16 PM Pager   If 7PM-7AM, please contact night-coverage [email or web address] Password [email or web address]   By signing my name below, I, Zadie Cleverly, attest that this documentation has been prepared under the direction and in the presence of Erick Blinks, MD. Electronically signed: Zadie Cleverly, Scribe. 07/14/20171:00  I, Dr. Erick Blinks, personally performed the services described in this documentaiton. All medical record entries made by the scribe were at my direction and in my presence. I have reviewed the chart and agree that the record reflects my personal performance and is accurate and complete  Erick Blinks, MD, 06/19/2016 1:16 PM

## 2016-06-21 DIAGNOSIS — N320 Bladder-neck obstruction: Secondary | ICD-10-CM | POA: Diagnosis not present

## 2016-06-21 DIAGNOSIS — R627 Adult failure to thrive: Secondary | ICD-10-CM | POA: Diagnosis not present

## 2016-06-21 DIAGNOSIS — Z794 Long term (current) use of insulin: Secondary | ICD-10-CM | POA: Diagnosis not present

## 2016-06-21 DIAGNOSIS — M109 Gout, unspecified: Secondary | ICD-10-CM | POA: Diagnosis not present

## 2016-06-21 DIAGNOSIS — E1165 Type 2 diabetes mellitus with hyperglycemia: Secondary | ICD-10-CM | POA: Diagnosis not present

## 2016-06-21 DIAGNOSIS — N183 Chronic kidney disease, stage 3 (moderate): Secondary | ICD-10-CM | POA: Diagnosis not present

## 2016-06-21 DIAGNOSIS — I82402 Acute embolism and thrombosis of unspecified deep veins of left lower extremity: Secondary | ICD-10-CM | POA: Diagnosis not present

## 2016-06-21 DIAGNOSIS — I129 Hypertensive chronic kidney disease with stage 1 through stage 4 chronic kidney disease, or unspecified chronic kidney disease: Secondary | ICD-10-CM | POA: Diagnosis not present

## 2016-06-21 DIAGNOSIS — Z7901 Long term (current) use of anticoagulants: Secondary | ICD-10-CM | POA: Diagnosis not present

## 2016-06-21 DIAGNOSIS — E1122 Type 2 diabetes mellitus with diabetic chronic kidney disease: Secondary | ICD-10-CM | POA: Diagnosis not present

## 2016-06-21 DIAGNOSIS — N401 Enlarged prostate with lower urinary tract symptoms: Secondary | ICD-10-CM | POA: Diagnosis not present

## 2016-06-23 DIAGNOSIS — Z1389 Encounter for screening for other disorder: Secondary | ICD-10-CM | POA: Diagnosis not present

## 2016-06-23 DIAGNOSIS — E876 Hypokalemia: Secondary | ICD-10-CM | POA: Diagnosis not present

## 2016-06-23 DIAGNOSIS — D638 Anemia in other chronic diseases classified elsewhere: Secondary | ICD-10-CM | POA: Diagnosis not present

## 2016-06-25 DIAGNOSIS — M109 Gout, unspecified: Secondary | ICD-10-CM | POA: Diagnosis not present

## 2016-06-25 DIAGNOSIS — I82402 Acute embolism and thrombosis of unspecified deep veins of left lower extremity: Secondary | ICD-10-CM | POA: Diagnosis not present

## 2016-06-25 DIAGNOSIS — N320 Bladder-neck obstruction: Secondary | ICD-10-CM | POA: Diagnosis not present

## 2016-06-25 DIAGNOSIS — R627 Adult failure to thrive: Secondary | ICD-10-CM | POA: Diagnosis not present

## 2016-06-25 DIAGNOSIS — N401 Enlarged prostate with lower urinary tract symptoms: Secondary | ICD-10-CM | POA: Diagnosis not present

## 2016-06-25 DIAGNOSIS — E1165 Type 2 diabetes mellitus with hyperglycemia: Secondary | ICD-10-CM | POA: Diagnosis not present

## 2016-06-25 DIAGNOSIS — Z7901 Long term (current) use of anticoagulants: Secondary | ICD-10-CM | POA: Diagnosis not present

## 2016-06-25 DIAGNOSIS — I129 Hypertensive chronic kidney disease with stage 1 through stage 4 chronic kidney disease, or unspecified chronic kidney disease: Secondary | ICD-10-CM | POA: Diagnosis not present

## 2016-06-25 DIAGNOSIS — N183 Chronic kidney disease, stage 3 (moderate): Secondary | ICD-10-CM | POA: Diagnosis not present

## 2016-06-25 DIAGNOSIS — Z794 Long term (current) use of insulin: Secondary | ICD-10-CM | POA: Diagnosis not present

## 2016-06-25 DIAGNOSIS — E1122 Type 2 diabetes mellitus with diabetic chronic kidney disease: Secondary | ICD-10-CM | POA: Diagnosis not present

## 2016-07-01 ENCOUNTER — Ambulatory Visit (INDEPENDENT_AMBULATORY_CARE_PROVIDER_SITE_OTHER): Payer: Medicare Other | Admitting: Urology

## 2016-07-01 DIAGNOSIS — R339 Retention of urine, unspecified: Secondary | ICD-10-CM

## 2016-07-01 DIAGNOSIS — N401 Enlarged prostate with lower urinary tract symptoms: Secondary | ICD-10-CM | POA: Diagnosis not present

## 2016-07-02 DIAGNOSIS — E1165 Type 2 diabetes mellitus with hyperglycemia: Secondary | ICD-10-CM | POA: Diagnosis not present

## 2016-07-02 DIAGNOSIS — N401 Enlarged prostate with lower urinary tract symptoms: Secondary | ICD-10-CM | POA: Diagnosis not present

## 2016-07-02 DIAGNOSIS — I82402 Acute embolism and thrombosis of unspecified deep veins of left lower extremity: Secondary | ICD-10-CM | POA: Diagnosis not present

## 2016-07-02 DIAGNOSIS — E1122 Type 2 diabetes mellitus with diabetic chronic kidney disease: Secondary | ICD-10-CM | POA: Diagnosis not present

## 2016-07-02 DIAGNOSIS — R627 Adult failure to thrive: Secondary | ICD-10-CM | POA: Diagnosis not present

## 2016-07-02 DIAGNOSIS — N320 Bladder-neck obstruction: Secondary | ICD-10-CM | POA: Diagnosis not present

## 2016-07-02 DIAGNOSIS — N183 Chronic kidney disease, stage 3 (moderate): Secondary | ICD-10-CM | POA: Diagnosis not present

## 2016-07-02 DIAGNOSIS — M109 Gout, unspecified: Secondary | ICD-10-CM | POA: Diagnosis not present

## 2016-07-02 DIAGNOSIS — Z794 Long term (current) use of insulin: Secondary | ICD-10-CM | POA: Diagnosis not present

## 2016-07-02 DIAGNOSIS — Z7901 Long term (current) use of anticoagulants: Secondary | ICD-10-CM | POA: Diagnosis not present

## 2016-07-02 DIAGNOSIS — I129 Hypertensive chronic kidney disease with stage 1 through stage 4 chronic kidney disease, or unspecified chronic kidney disease: Secondary | ICD-10-CM | POA: Diagnosis not present

## 2016-07-08 ENCOUNTER — Ambulatory Visit (INDEPENDENT_AMBULATORY_CARE_PROVIDER_SITE_OTHER): Payer: Medicare Other | Admitting: Urology

## 2016-07-08 DIAGNOSIS — R339 Retention of urine, unspecified: Secondary | ICD-10-CM

## 2016-07-09 ENCOUNTER — Ambulatory Visit: Payer: Medicare Other | Admitting: Cardiovascular Disease

## 2016-07-10 ENCOUNTER — Emergency Department (HOSPITAL_COMMUNITY)
Admission: EM | Admit: 2016-07-10 | Discharge: 2016-07-10 | Disposition: A | Discharge status: Home / Self Care | Payer: Medicare Other | Attending: Emergency Medicine | Admitting: Emergency Medicine

## 2016-07-10 ENCOUNTER — Encounter (HOSPITAL_COMMUNITY): Payer: Self-pay | Admitting: Emergency Medicine

## 2016-07-10 DIAGNOSIS — Z794 Long term (current) use of insulin: Secondary | ICD-10-CM | POA: Diagnosis not present

## 2016-07-10 DIAGNOSIS — E860 Dehydration: Secondary | ICD-10-CM

## 2016-07-10 DIAGNOSIS — E1122 Type 2 diabetes mellitus with diabetic chronic kidney disease: Secondary | ICD-10-CM | POA: Insufficient documentation

## 2016-07-10 DIAGNOSIS — I129 Hypertensive chronic kidney disease with stage 1 through stage 4 chronic kidney disease, or unspecified chronic kidney disease: Secondary | ICD-10-CM | POA: Insufficient documentation

## 2016-07-10 DIAGNOSIS — Z79899 Other long term (current) drug therapy: Secondary | ICD-10-CM | POA: Insufficient documentation

## 2016-07-10 DIAGNOSIS — E039 Hypothyroidism, unspecified: Secondary | ICD-10-CM | POA: Insufficient documentation

## 2016-07-10 DIAGNOSIS — R531 Weakness: Secondary | ICD-10-CM | POA: Diagnosis not present

## 2016-07-10 DIAGNOSIS — N183 Chronic kidney disease, stage 3 (moderate): Secondary | ICD-10-CM | POA: Diagnosis not present

## 2016-07-10 DIAGNOSIS — R42 Dizziness and giddiness: Secondary | ICD-10-CM | POA: Diagnosis not present

## 2016-07-10 DIAGNOSIS — N390 Urinary tract infection, site not specified: Secondary | ICD-10-CM

## 2016-07-10 DIAGNOSIS — R404 Transient alteration of awareness: Secondary | ICD-10-CM | POA: Diagnosis not present

## 2016-07-10 LAB — URINALYSIS, ROUTINE W REFLEX MICROSCOPIC
BILIRUBIN URINE: NEGATIVE
Glucose, UA: NEGATIVE mg/dL
KETONES UR: NEGATIVE mg/dL
NITRITE: NEGATIVE
PH: 5.5 (ref 5.0–8.0)
Protein, ur: 30 mg/dL — AB
Specific Gravity, Urine: 1.02 (ref 1.005–1.030)

## 2016-07-10 LAB — CBC WITH DIFFERENTIAL/PLATELET
Basophils Absolute: 0 10*3/uL (ref 0.0–0.1)
Basophils Relative: 1 %
Eosinophils Absolute: 0.1 10*3/uL (ref 0.0–0.7)
Eosinophils Relative: 2 %
HCT: 31.1 % — ABNORMAL LOW (ref 39.0–52.0)
Hemoglobin: 10.2 g/dL — ABNORMAL LOW (ref 13.0–17.0)
Lymphocytes Relative: 30 %
Lymphs Abs: 1.4 10*3/uL (ref 0.7–4.0)
MCH: 28.9 pg (ref 26.0–34.0)
MCHC: 32.8 g/dL (ref 30.0–36.0)
MCV: 88.1 fL (ref 78.0–100.0)
Monocytes Absolute: 0.5 10*3/uL (ref 0.1–1.0)
Monocytes Relative: 10 %
Neutro Abs: 2.6 10*3/uL (ref 1.7–7.7)
Neutrophils Relative %: 57 %
Platelets: 135 10*3/uL — ABNORMAL LOW (ref 150–400)
RBC: 3.53 MIL/uL — ABNORMAL LOW (ref 4.22–5.81)
RDW: 14.8 % (ref 11.5–15.5)
WBC: 4.6 10*3/uL (ref 4.0–10.5)

## 2016-07-10 LAB — COMPREHENSIVE METABOLIC PANEL
ALT: 11 U/L — ABNORMAL LOW (ref 17–63)
AST: 14 U/L — ABNORMAL LOW (ref 15–41)
Albumin: 3 g/dL — ABNORMAL LOW (ref 3.5–5.0)
Alkaline Phosphatase: 48 U/L (ref 38–126)
Anion gap: 8 (ref 5–15)
BUN: 31 mg/dL — ABNORMAL HIGH (ref 6–20)
CO2: 29 mmol/L (ref 22–32)
Calcium: 8.2 mg/dL — ABNORMAL LOW (ref 8.9–10.3)
Chloride: 98 mmol/L — ABNORMAL LOW (ref 101–111)
Creatinine, Ser: 1.59 mg/dL — ABNORMAL HIGH (ref 0.61–1.24)
GFR calc Af Amer: 46 mL/min — ABNORMAL LOW (ref >60)
GFR calc non Af Amer: 39 mL/min — ABNORMAL LOW (ref >60)
Glucose, Bld: 201 mg/dL — ABNORMAL HIGH (ref 65–99)
Potassium: 3 mmol/L — ABNORMAL LOW (ref 3.5–5.1)
Sodium: 135 mmol/L (ref 135–145)
Total Bilirubin: 0.7 mg/dL (ref 0.3–1.2)
Total Protein: 7.1 g/dL (ref 6.5–8.1)

## 2016-07-10 LAB — URINE MICROSCOPIC-ADD ON: SQUAMOUS EPITHELIAL / LPF: NONE SEEN

## 2016-07-10 LAB — LACTIC ACID, PLASMA: Lactic Acid, Venous: 1.2 mmol/L (ref 0.5–1.9)

## 2016-07-10 MED ORDER — CEPHALEXIN 500 MG PO CAPS
500.0000 mg | ORAL_CAPSULE | Freq: Once | ORAL | Status: AC
  Administered 2016-07-10: 500 mg via ORAL
  Filled 2016-07-10: qty 1

## 2016-07-10 MED ORDER — SODIUM CHLORIDE 0.9 % IV BOLUS (SEPSIS)
500.0000 mL | Freq: Once | INTRAVENOUS | Status: AC
  Administered 2016-07-10: 500 mL via INTRAVENOUS

## 2016-07-10 MED ORDER — CIPROFLOXACIN HCL 500 MG PO TABS: 500.0000 mg | ORAL_TABLET | Freq: Two times a day (BID) | ORAL | 0 refills | Status: DC

## 2016-07-10 MED ORDER — POTASSIUM CHLORIDE CRYS ER 20 MEQ PO TBCR
40.0000 meq | EXTENDED_RELEASE_TABLET | Freq: Once | ORAL | Status: AC
  Administered 2016-07-10: 40 meq via ORAL
  Filled 2016-07-10: qty 2

## 2016-07-10 NOTE — ED Provider Notes (Signed)
[facility]-EMER[facility]Y [facility]PT Provider Note   CSN: [id number] Arrival date & time: 07/10/16  0800  First Provider Contact:  First MD Initiated Contact with Patient 07/10/16 0840        History   Chief Complaint Chief Complaint  Patient presents with  . Weakness    HPI Garrett Bradley is a 82 y.o. male.  Patient is an 82 year old male who presents to the emergency department with a complaint of blood in the urine and some weakness.  The patient states that he was at his urology office approximately a week ago. He had his catheter changed. He states that he had some discomfort on this time with the catheter change. He states that it was more than usual. He requires catheter changes because of prostate enlargement. On yesterday he noticed a change in the color of the urine in his Foley bag. Today he noted a small amount of blood present and he thought he should come to the emergency department for evaluation. He denies fever, chills, nausea, or vomiting. He states he does not have any pain on male. He complained of feeling some weakness on yesterday, but states it feels some better today.   The history is provided by the patient.  Weakness     Past Medical History:  Diagnosis Date  . Depression   . Diabetes mellitus   . Enlarged prostate    Status post TURP; self caths @ home  . Hypertension   . Hypothyroidism     Patient Active Problem List   Diagnosis Date Noted  . Hypokalemia 06/18/2016  . Scrotal swelling   . Acute kidney injury (HCC)   . DVT (deep venous thrombosis) (HCC) 04/13/2016  . Diastolic dysfunction 04/13/2016  . Edema 04/07/2016  . Wrist pain, right 03/24/2016  . Adult failure to thrive 03/24/2016  . AKI (acute kidney injury) (HCC) 03/20/2016  . Orthostatic hypotension 03/20/2016  . Protein-calorie malnutrition, severe 03/17/2016  . Dehydration 03/16/2016  . Hypernatremia 03/16/2016  . Diabetes mellitus type 2 with complications (HCC) 03/16/2016  . Depression  03/16/2016  . Thrombocytopenia, unspecified (HCC) 07/27/2014  . UTI (lower urinary tract infection) 06/24/2014  . CKD (chronic kidney disease) stage 3, GFR 30-59 ml/min 08/06/2013  . Chronic indwelling Foley catheter 08/06/2013  . Hypertension 10/02/2011  . Hypothyroidism 09/24/2011  . Anemia 09/24/2011  . BPH (benign prostatic hyperplasia) 09/24/2011    Past Surgical History:  Procedure Laterality Date  . HERNIA REPAIR    . TRANSURETHRAL RESECTION OF PROSTATE  09/22/2011   Procedure: TRANSURETHRAL RESECTION OF THE PROSTATE (TURP);  Surgeon: Ky Barban;  Location: [facility];  Service: Urology;  Laterality: N/A;       Home Medications    Prior to Admission medications   Medication Sig Start Date End Date Taking? Authorizing Provider  acetaminophen (TYLENOL) 325 MG tablet Take two tablets by mouth every 4 hours as needed for pain/temp    Historical Provider, MD  ALPRAZolam (XANAX) 1 MG tablet Take 1 tablet (1 mg total) by mouth at bedtime as needed for sleep. 03/23/16   Standley Brooking, MD  cephALEXin (KEFLEX) 500 MG capsule Take 1 capsule (500 mg total) by mouth 4 (four) times daily. 06/07/16   Eber Hong, MD  citalopram (CELEXA) 20 MG tablet Take 1 tablet (20 mg total) by mouth daily. 03/23/16   Standley Brooking, MD  clobetasol ointment (TEMOVATE) 0.05 % Apply 1 application topically daily as needed. For rash on legs 02/09/14   Historical Provider,  MD  diltiazem (CARDIZEM CD) 300 MG 24 hr capsule Take 300 mg by mouth daily.     Historical Provider, MD  fish oil-omega-3 fatty acids 1000 MG capsule Take 1 capsule (1 g total) by mouth 2 (two) times daily. 10/02/11   Christiane Ha, MD  furosemide (LASIX) 20 MG tablet TAKE 40 MG DAILY AS NEEDED FOR LEG SWELLING. 06/19/16   Erick Blinks, MD  GLUCERNA (GLUCERNA) LIQD Take 237 mLs by mouth 2 (two) times daily between meals.    Historical Provider, MD  insulin detemir (LEVEMIR) 100 UNIT/ML injection Inject 20 Units into the skin at  bedtime.     Historical Provider, MD  levothyroxine (SYNTHROID, LEVOTHROID) 137 MCG tablet Take 137 mcg by mouth daily. 01/30/16   Historical Provider, MD  loperamide (IMODIUM) 2 MG capsule Take 2 tablet by mouth daily as needed    Historical Provider, MD  polyethylene glycol (MIRALAX / GLYCOLAX) packet Take 17 g by mouth 2 (two) times daily. Give until he has daily soft stools.    Historical Provider, MD  Potassium Chloride ER 20 MEQ TBCR Take 1 tablet by mouth twice a day    Historical Provider, MD  senna (SENOKOT) 8.6 MG TABS tablet Take 1 tablet (8.6 mg total) by mouth daily. 03/11/16   Lavera Guise, MD  Skin Protectants, Misc. (EUCERIN) cream Apply topical to BLE and feet for dry skin twice daily and as needed.    Historical Provider, MD  tamsulosin (FLOMAX) 0.4 MG CAPS capsule Take 0.4 mg by mouth daily.  06/03/16   Historical Provider, MD  Travoprost, BAK Free, (TRAVATAN) 0.004 % SOLN ophthalmic solution Place 1 drop into both eyes at bedtime.    Historical Provider, MD  vitamin B-12 (CYANOCOBALAMIN) 1000 MCG tablet Take one tablet by mouth once daily    Historical Provider, MD  XARELTO 15 MG TABS tablet Take 1 tablet once daily 04/12/16   Historical Provider, MD    Family History Family History  Problem Relation Age of Onset  . Hypertension Mother   . Cerebral aneurysm Mother   . Hypertension Brother   . Heart disease Father     Heart attack  . Cancer Neg Hx   . Diabetes Neg Hx     only among cousins    Social History Social History  Substance Use Topics  . Smoking status: Never Smoker  . Smokeless tobacco: Never Used  . Alcohol use No     Allergies   Thiazide-type diuretics   Review of Systems Review of Systems  Constitutional: Negative for activity change, appetite change, chills and fever.  Gastrointestinal: Negative for nausea and vomiting.  Genitourinary: Positive for difficulty urinating and hematuria.  Neurological: Positive for weakness.  All other systems  reviewed and are negative.    Physical Exam Updated Vital Signs BP 132/80 (BP Location: Left Arm)   Pulse 84   Temp 98.1 F (36.7 C) (Oral)   Resp 16   Ht 6\' 4"  (1.93 m)   Wt 112 kg   SpO2 100%   BMI 30.07 kg/m   Physical Exam  Constitutional: He is oriented to person, place, and time. He appears well-developed and well-nourished.  Non-toxic appearance.  HENT:  Head: Normocephalic.  Right Ear: Tympanic membrane and external ear normal.  Left Ear: Tympanic membrane and external ear normal.  Eyes: EOM and lids are normal. Pupils are equal, round, and reactive to light.  Neck: Normal range of motion. Neck supple. Carotid bruit is not  present.  Cardiovascular: Normal rate, intact distal pulses and normal pulses.  An irregularly irregular rhythm present.  Pulmonary/Chest: Breath sounds normal. No respiratory distress.  Abdominal: Soft. Bowel sounds are normal. There is no tenderness. There is no guarding and no CVA tenderness.  No evidence for bladder distention.  Genitourinary:  Genitourinary Comments: Foley cath in place.  Musculoskeletal: Normal range of motion.  1-2+ edema of the lower extremities.  Lymphadenopathy:       Head (right side): No submandibular adenopathy present.       Head (left side): No submandibular adenopathy present.    He has no cervical adenopathy.  Neurological: He is alert and oriented to person, place, and time. He has normal strength. No cranial nerve deficit or sensory deficit.  Skin: Skin is warm and dry.  Psychiatric: He has a normal mood and affect. His speech is normal.  Nursing note and vitals reviewed.    ED Treatments / Results  Labs (all labs ordered are listed, but only abnormal results are displayed) Labs Reviewed  URINALYSIS, ROUTINE W REFLEX MICROSCOPIC (NOT AT [facility]) - Abnormal; Notable for the following:       Result Value   APPearance HAZY (*)    Hgb urine dipstick LARGE (*)    Protein, ur 30 (*)    Leukocytes, UA MODERATE  (*)    All other components within normal limits  URINE MICROSCOPIC-ADD ON - Abnormal; Notable for the following:    Bacteria, UA FEW (*)    All other components within normal limits  COMPREHENSIVE METABOLIC PANEL  CBC WITH DIFFERENTIAL/PLATELET  LACTIC ACID, PLASMA  LACTIC ACID, PLASMA    EKG  EKG Interpretation None       Radiology No results found.  Procedures Procedures (including critical care time)  Medications Ordered in ED Medications - No data to display   Initial Impression / Assessment and Plan / ED Course  I have reviewed the triage vital signs and the nursing notes.  Pertinent labs & imaging results that were available during my care of the patient were reviewed by me and considered in my medical decision making (see chart for details).  Clinical Course    *I have reviewed nursing notes, vital signs, and all appropriate lab and imaging results for this patient.**  Final Clinical Impressions(s) / ED Diagnoses  The comprehensive metabolic panel shows potassium to be low at 3. The patient has had previous evaluations of his chemistries with low potassium. Oral potassium given in the emergency department. The glucose is slightly elevated at 201. The BUN is elevated at 31, and the creatinine is elevated at 1.59. The albumin is low at 3.0. The complete blood count shows a white blood cells to be normal at 4600. The hemoglobin is slightly low at 10.2, and the hematocrit is low at 31.1. There is no evidence of a shift to the left. The lactic acid is normal at 1.2. The urinalysis shows moderate leukocyte esterase, with large hemoglobin. There are too many to count white blood cells and red blood cells present. Blood pressure is well within normal limits at 132/71, the temperature is normal at 98, the heart rate is normal at 84, the patient does not appear to be in any distress or shock. The patient will be treated with oral Keflex. Doses been given in the emergency  department. The patient appears clinically mild to moderately dry. A soft bolus of saline of 500 mL will be given intravenously .  Patient tolerated the  fluids without problem. Tolerated Keflex without problem. Patient can stand on without problem, states that he feels stronger now than he did previously. Orthostatic blood pressure on checks are negative for orthostatic changes.  The patient will be discharged home with Cipro 2 times daily. I've given him instructions to increase fluids, and to follow with his urology specialist in the next 7-10 days for recheck of his urinary tract infection. The patient is in agreement with this discharge plan.    Final diagnoses:  None    New Prescriptions New Prescriptions   No medications on file     Ivery Quale, PA-C 07/10/16 1125

## 2016-07-10 NOTE — ED Provider Notes (Signed)
Medical screening examination/treatment/procedure(s) were conducted as a shared visit with non-physician practitioner(s) and myself.  I personally evaluated the patient during the encounter.   EKG Interpretation  Date/Time:  Friday July 10 2016 08:03:46 EDT Ventricular Rate:  71 PR Interval:    QRS Duration: 166 QT Interval:  441 QTC Calculation: 480 R Axis:   -91 Text Interpretation:  Sinus rhythm Multiple ventricular premature complexes RBBB and LAFB Probable left ventricular hypertrophy No significant change since last tracing Confirmed by Jerzi Tigert  MD, Doral Ventrella [phone]) on 07/10/2016 9:01:02 AM      Results for orders placed or performed during the hospital encounter of 07/10/16  Urinalysis, Routine w reflex microscopic (not at [facility])  Result Value Ref Range   Color, Urine YELLOW YELLOW   APPearance HAZY (A) CLEAR   Specific Gravity, Urine 1.020 1.005 - 1.030   pH 5.5 5.0 - 8.0   Glucose, UA NEGATIVE NEGATIVE mg/dL   Hgb urine dipstick LARGE (A) NEGATIVE   Bilirubin Urine NEGATIVE NEGATIVE   Ketones, ur NEGATIVE NEGATIVE mg/dL   Protein, ur 30 (A) NEGATIVE mg/dL   Nitrite NEGATIVE NEGATIVE   Leukocytes, UA MODERATE (A) NEGATIVE  Urine microscopic-add on  Result Value Ref Range   Squamous Epithelial / LPF NONE SEEN NONE SEEN   WBC, UA TOO NUMEROUS TO COUNT 0 - 5 WBC/hpf   RBC / HPF TOO NUMEROUS TO COUNT 0 - 5 RBC/hpf   Bacteria, UA FEW (A) NONE SEEN  Comprehensive metabolic panel  Result Value Ref Range   Sodium 135 135 - 145 mmol/L   Potassium 3.0 (L) 3.5 - 5.1 mmol/L   Chloride 98 (L) 101 - 111 mmol/L   CO2 29 22 - 32 mmol/L   Glucose, Bld 201 (H) 65 - 99 mg/dL   BUN 31 (H) 6 - 20 mg/dL   Creatinine, Ser 6.04 (H) 0.61 - 1.24 mg/dL   Calcium 8.2 (L) 8.9 - 10.3 mg/dL   Total Protein 7.1 6.5 - 8.1 g/dL   Albumin 3.0 (L) 3.5 - 5.0 g/dL   AST 14 (L) 15 - 41 U/L   ALT 11 (L) 17 - 63 U/L   Alkaline Phosphatase 48 38 - 126 U/L   Total Bilirubin 0.7 0.3 - 1.2 mg/dL   GFR  calc non Af Amer 39 (L) >60 mL/min   GFR calc Af Amer 46 (L) >60 mL/min   Anion gap 8 5 - 15  CBC with Differential  Result Value Ref Range   WBC 4.6 4.0 - 10.5 K/uL   RBC 3.53 (L) 4.22 - 5.81 MIL/uL   Hemoglobin 10.2 (L) 13.0 - 17.0 g/dL   HCT 54.0 (L) 98.1 - 19.1 %   MCV 88.1 78.0 - 100.0 fL   MCH 28.9 26.0 - 34.0 pg   MCHC 32.8 30.0 - 36.0 g/dL   RDW 47.8 29.5 - 62.1 %   Platelets 135 (L) 150 - 400 K/uL   Neutrophils Relative % 57 %   Neutro Abs 2.6 1.7 - 7.7 K/uL   Lymphocytes Relative 30 %   Lymphs Abs 1.4 0.7 - 4.0 K/uL   Monocytes Relative 10 %   Monocytes Absolute 0.5 0.1 - 1.0 K/uL   Eosinophils Relative 2 %   Eosinophils Absolute 0.1 0.0 - 0.7 K/uL   Basophils Relative 1 %   Basophils Absolute 0.0 0.0 - 0.1 K/uL  Lactic acid, plasma  Result Value Ref Range   Lactic Acid, Venous 1.2 0.5 - 1.9 mmol/L  Patient seen by me. Patient not feeling quite right for the past couple days. Has had a little bit of generalized weakness lobe of the dizziness no true room spinning. He noted some blood in his urine today. Patient's had a Foley catheter in place since June. It was replaced earlier this week. Patient's lab workup and all looks like there is probably a true urinary tract infection. Patient's white blood count is normal kidney function shows a little bit of increased elevation of BUN which may be related to a little bit of dehydration. Patient did receive some IV fluids here. Patient did receive antibiotics here. Patient is able to ambulate patient is probably stable for discharge home. The patient gets up and feels like he's got pass out he probably will require admission.  Patient's vital signs without any significant abnormalities blood pressure is good.     Vanetta Mulders, MD 07/10/16 1011

## 2016-07-10 NOTE — ED Triage Notes (Signed)
Per EMS, pt from home c/o generalized weakness, dizziness, and hematuria. Pt states he went to bed feeling normal, but when he woke up this morning, he felt bad. Pt had a Foley placed earlier this week.

## 2016-07-10 NOTE — Discharge Instructions (Signed)
Your examination questions a urinary tract infection, and mild dehydration. Please increase your water, juices, Gatorade's. Please use cipro with a meal two times daily. Please see your urology specialist for recheck of your urine in 7-10 days. Please return to the emergency department if any high fever, unusual weakness, difficulty with your usual activities of daily living.

## 2016-07-10 NOTE — ED Notes (Signed)
EDP at bedside updating patient and family. 

## 2016-07-11 LAB — URINE CULTURE: Culture: NO GROWTH

## 2016-07-13 DIAGNOSIS — Z6831 Body mass index (BMI) 31.0-31.9, adult: Secondary | ICD-10-CM | POA: Diagnosis not present

## 2016-07-13 DIAGNOSIS — F419 Anxiety disorder, unspecified: Secondary | ICD-10-CM | POA: Diagnosis not present

## 2016-07-20 ENCOUNTER — Encounter: Payer: Self-pay | Admitting: Cardiovascular Disease

## 2016-07-20 ENCOUNTER — Ambulatory Visit (INDEPENDENT_AMBULATORY_CARE_PROVIDER_SITE_OTHER): Payer: Medicare Other | Admitting: Cardiovascular Disease

## 2016-07-20 VITALS — BP 144/62 | HR 52 | Ht 76.0 in | Wt 252.0 lb

## 2016-07-20 DIAGNOSIS — I10 Essential (primary) hypertension: Secondary | ICD-10-CM

## 2016-07-20 DIAGNOSIS — R600 Localized edema: Secondary | ICD-10-CM | POA: Diagnosis not present

## 2016-07-20 DIAGNOSIS — I519 Heart disease, unspecified: Secondary | ICD-10-CM

## 2016-07-20 DIAGNOSIS — Z87898 Personal history of other specified conditions: Secondary | ICD-10-CM

## 2016-07-20 DIAGNOSIS — I77810 Thoracic aortic ectasia: Secondary | ICD-10-CM

## 2016-07-20 DIAGNOSIS — I429 Cardiomyopathy, unspecified: Secondary | ICD-10-CM

## 2016-07-20 DIAGNOSIS — Z9289 Personal history of other medical treatment: Secondary | ICD-10-CM

## 2016-07-20 NOTE — Patient Instructions (Signed)
Your physician wants you to follow-up in: 1 year You will receive a reminder letter in the mail two months in advance. If you don't receive a letter, please call our office to schedule the follow-up appointment.   Your physician recommends that you continue on your current medications as directed. Please refer to the Current Medication list given to you today.    If you need a refill on your cardiac medications before your next appointment, please call your pharmacy.      Thank you for choosing [facility] !        

## 2016-07-20 NOTE — Progress Notes (Signed)
SUBJECTIVE: Patient returns for follow-up of bilateral leg edema and cardiomyopathy. EF previously 45-50%. Repeat echocardiogram 05/06/16 showed normal left ventricular systolic function, EF 55-60%, grade 1 diastolic dysfunction.  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 Hospitalized in July for scrotal swelling and lower extremity edema. He was hypokalemic.  Feels well since having Foley catheter changed. Leg swelling has improved. Takes Lasix 20 mg up to 2 and 3 times a day with potassium replacement therapy. Denies chest pain and exertional dyspnea.   Review of Systems: As per "subjective", otherwise negative.  Allergies  Allergen Reactions  . Thiazide-Type Diuretics     03/2016 acute wrist pain, presumed gout Uric acid 10.4    Current Outpatient Prescriptions  Medication Sig Dispense Refill  . acetaminophen (TYLENOL) 325 MG tablet Take two tablets by mouth every 4 hours as needed for pain/temp    . ALPRAZolam (XANAX) 1 MG tablet Take 1 tablet (1 mg total) by mouth at bedtime as needed for sleep. 10 tablet 0  . cephALEXin (KEFLEX) 500 MG capsule Take 1 capsule (500 mg total) by mouth 4 (four) times daily. 40 capsule 0  . ciprofloxacin (CIPRO) 500 MG tablet Take 1 tablet (500 mg total) by mouth 2 (two) times daily. 14 tablet 0  . citalopram (CELEXA) 20 MG tablet Take 1 tablet (20 mg total) by mouth daily.    . clobetasol ointment (TEMOVATE) 0.05 % Apply 1 application topically daily as needed. For rash on legs    . diltiazem (CARDIZEM CD) 300 MG 24 hr capsule Take 300 mg by mouth daily.     . fish oil-omega-3 fatty acids 1000 MG capsule Take 1 capsule (1 g total) by mouth 2 (two) times daily.    . furosemide (LASIX) 20 MG tablet TAKE 40 MG DAILY AS NEEDED FOR LEG SWELLING. (Patient taking differently: Take 20 mg by mouth daily as needed for fluid. May take up to 3 times a day if needed.) 90 tablet 3  . GLUCERNA (GLUCERNA) LIQD Take 237 mLs by  mouth 2 (two) times daily between meals.    . insulin detemir (LEVEMIR) 100 UNIT/ML injection Inject 20 Units into the skin at bedtime.     Marland Kitchen. levothyroxine (SYNTHROID, LEVOTHROID) 137 MCG tablet Take 137 mcg by mouth daily.    Marland Kitchen. loperamide (IMODIUM) 2 MG capsule Take 4 mg by mouth as needed for diarrhea or loose stools. Take 2 tablet by mouth daily as needed    . polyethylene glycol (MIRALAX / GLYCOLAX) packet Take 17 g by mouth 2 (two) times daily. Give until he has daily soft stools.    . Potassium Chloride ER 20 MEQ TBCR Take 1 tablet by mouth twice a day    . senna (SENOKOT) 8.6 MG TABS tablet Take 1 tablet (8.6 mg total) by mouth daily. 120 each 0  . Skin Protectants, Misc. (EUCERIN) cream Apply topical to BLE and feet for dry skin twice daily and as needed.    . tamsulosin (FLOMAX) 0.4 MG CAPS capsule Take 0.4 mg by mouth daily after supper.     . Travoprost, BAK Free, (TRAVATAN) 0.004 % SOLN ophthalmic solution Place 1 drop into both eyes at bedtime.    . vitamin B-12 (CYANOCOBALAMIN) 1000 MCG tablet Take one tablet by mouth once daily    . XARELTO 15 MG TABS tablet Take 1 tablet once daily  0   No current facility-administered medications for this visit.  Past Medical History:  Diagnosis Date  . Depression   . Diabetes mellitus   . Enlarged prostate    Status post TURP; self caths @ home  . Hypertension   . Hypothyroidism     Past Surgical History:  Procedure Laterality Date  . HERNIA REPAIR    . TRANSURETHRAL RESECTION OF PROSTATE  09/22/2011   Procedure: TRANSURETHRAL RESECTION OF THE PROSTATE (TURP);  Surgeon: Ky BarbanMohammad I Javaid;  Location: [facility];  Service: Urology;  Laterality: N/A;    Social History   Social History  . Marital status: Legally Separated    Spouse name: N/A  . Number of children: N/A  . Years of education: N/A   Occupational History  . Not on file.   Social History Main Topics  . Smoking status: Never Smoker  . Smokeless tobacco: Never Used   . Alcohol use No  . Drug use: No  . Sexual activity: Not Currently   Other Topics Concern  . Not on file   Social History Narrative  . No narrative on file     Vitals:   07/20/16 1116  BP: (!) 144/62  Pulse: (!) 52  SpO2: 97%  Weight: 252 lb (114.3 kg)  Height: 6\' 4"  (1.93 m)    PHYSICAL EXAM General: NAD Neck: No JVD, no thyromegaly or thyroid nodule.  Lungs: Clear to auscultation bilaterally with normal respiratory effort. CV: Nondisplaced PMI. Regular rate and rhythm with premature contractions, normal S1/S2, no S3/S4, no murmur.  Trace pretibial and 1+ perinakle edema.     Abdomen: Soft, nontender, no distention.  Skin: Intact without lesions or rashes.  Neurologic: Alert and oriented.  Psych: Normal affect. Extremities: B/l finger clubbing.  HEENT: Normal.     ECG: Most recent ECG reviewed.      ASSESSMENT AND PLAN: 1. Bilateral leg edema: Stable. I would continue to use compression stockings and Lasix 20 mg as needed (2-3 times daily with KCl). This very well may be due to diastolic dysfunction.  2. Cardiomyopathy: EF has normalized. Continue present therapy.  3. Aortic root dilatation: Mild by most recent echo.  4. Essential HTN: Mildly elevated today. May need additional antihypertensive therapy. No changes at present.  Dispo: fu 6 months.  Prentice DockerSuresh Dusan Lipford, M.D., F.A.C.C.

## 2016-07-29 ENCOUNTER — Ambulatory Visit (INDEPENDENT_AMBULATORY_CARE_PROVIDER_SITE_OTHER): Payer: Medicare Other | Admitting: Urology

## 2016-07-29 DIAGNOSIS — R339 Retention of urine, unspecified: Secondary | ICD-10-CM | POA: Diagnosis not present

## 2016-08-14 ENCOUNTER — Emergency Department (HOSPITAL_COMMUNITY)
Admission: EM | Admit: 2016-08-14 | Discharge: 2016-08-14 | Disposition: A | Discharge status: Home / Self Care | Payer: Medicare Other | Attending: Emergency Medicine | Admitting: Emergency Medicine

## 2016-08-14 ENCOUNTER — Encounter (HOSPITAL_COMMUNITY): Payer: Self-pay | Admitting: Emergency Medicine

## 2016-08-14 DIAGNOSIS — N183 Chronic kidney disease, stage 3 (moderate): Secondary | ICD-10-CM | POA: Insufficient documentation

## 2016-08-14 DIAGNOSIS — Z794 Long term (current) use of insulin: Secondary | ICD-10-CM | POA: Insufficient documentation

## 2016-08-14 DIAGNOSIS — B379 Candidiasis, unspecified: Secondary | ICD-10-CM | POA: Diagnosis not present

## 2016-08-14 DIAGNOSIS — E039 Hypothyroidism, unspecified: Secondary | ICD-10-CM | POA: Insufficient documentation

## 2016-08-14 DIAGNOSIS — E1122 Type 2 diabetes mellitus with diabetic chronic kidney disease: Secondary | ICD-10-CM | POA: Insufficient documentation

## 2016-08-14 DIAGNOSIS — Z79899 Other long term (current) drug therapy: Secondary | ICD-10-CM | POA: Insufficient documentation

## 2016-08-14 DIAGNOSIS — I129 Hypertensive chronic kidney disease with stage 1 through stage 4 chronic kidney disease, or unspecified chronic kidney disease: Secondary | ICD-10-CM | POA: Diagnosis not present

## 2016-08-14 DIAGNOSIS — B370 Candidal stomatitis: Secondary | ICD-10-CM

## 2016-08-14 LAB — CBG MONITORING, ED: Glucose-Capillary: 349 mg/dL — ABNORMAL HIGH (ref 65–99)

## 2016-08-14 MED ORDER — NYSTATIN 100000 UNIT/ML MT SUSP: 500000.0000 [IU] | Freq: Four times a day (QID) | OROMUCOSAL | 0 refills | Status: DC

## 2016-08-14 NOTE — ED Provider Notes (Signed)
[facility]-EMER[facility]Y DEPT Provider Note   CSN: [id number] Arrival date & time: 08/14/16  1243     History   Chief Complaint Chief Complaint  Patient presents with  . Thrush    HPI Garrett Bradley is a 82 y.o. male.  Patient complains of irritated tongue for approximately 1 day. He states tongue is sore and red in color. No trouble swallowing. No masses. No fever, sweats, chills. He is eating and talking.      Past Medical History:  Diagnosis Date  . Depression   . Diabetes mellitus   . Enlarged prostate    Status post TURP; self caths @ home  . Hypertension   . Hypothyroidism     Patient Active Problem List   Diagnosis Date Noted  . Hypokalemia 06/18/2016  . Scrotal swelling   . Acute kidney injury (HCC)   . DVT (deep venous thrombosis) (HCC) 04/13/2016  . Diastolic dysfunction 04/13/2016  . Edema 04/07/2016  . Wrist pain, right 03/24/2016  . Adult failure to thrive 03/24/2016  . AKI (acute kidney injury) (HCC) 03/20/2016  . Orthostatic hypotension 03/20/2016  . Protein-calorie malnutrition, severe 03/17/2016  . Dehydration 03/16/2016  . Hypernatremia 03/16/2016  . Diabetes mellitus type 2 with complications (HCC) 03/16/2016  . Depression 03/16/2016  . Thrombocytopenia, unspecified (HCC) 07/27/2014  . UTI (lower urinary tract infection) 06/24/2014  . CKD (chronic kidney disease) stage 3, GFR 30-59 ml/min 08/06/2013  . Chronic indwelling Foley catheter 08/06/2013  . Hypertension 10/02/2011  . Hypothyroidism 09/24/2011  . Anemia 09/24/2011  . BPH (benign prostatic hyperplasia) 09/24/2011    Past Surgical History:  Procedure Laterality Date  . HERNIA REPAIR    . TRANSURETHRAL RESECTION OF PROSTATE  09/22/2011   Procedure: TRANSURETHRAL RESECTION OF THE PROSTATE (TURP);  Surgeon: Ky Barban;  Location: [facility];  Service: Urology;  Laterality: N/A;       Home Medications    Prior to Admission medications   Medication Sig Start Date End Date Taking?  Authorizing Provider  acetaminophen (TYLENOL) 325 MG tablet Take two tablets by mouth every 4 hours as needed for pain/temp    Historical Provider, MD  ALPRAZolam (XANAX) 1 MG tablet Take 1 tablet (1 mg total) by mouth at bedtime as needed for sleep. 03/23/16   Standley Brooking, MD  cephALEXin (KEFLEX) 500 MG capsule Take 1 capsule (500 mg total) by mouth 4 (four) times daily. 06/07/16   Eber Hong, MD  ciprofloxacin (CIPRO) 500 MG tablet Take 1 tablet (500 mg total) by mouth 2 (two) times daily. 07/10/16   Ivery Quale, PA-C  citalopram (CELEXA) 20 MG tablet Take 1 tablet (20 mg total) by mouth daily. 03/23/16   Standley Brooking, MD  clobetasol ointment (TEMOVATE) 0.05 % Apply 1 application topically daily as needed. For rash on legs 02/09/14   Historical Provider, MD  diltiazem (CARDIZEM CD) 300 MG 24 hr capsule Take 300 mg by mouth daily.     Historical Provider, MD  fish oil-omega-3 fatty acids 1000 MG capsule Take 1 capsule (1 g total) by mouth 2 (two) times daily. 10/02/11   Christiane Ha, MD  furosemide (LASIX) 20 MG tablet TAKE 40 MG DAILY AS NEEDED FOR LEG SWELLING. Patient taking differently: Take 20 mg by mouth daily as needed for fluid. May take up to 3 times a day if needed. 06/19/16   Erick Blinks, MD  GLUCERNA (GLUCERNA) LIQD Take 237 mLs by mouth 2 (two) times daily between meals.  Historical Provider, MD  insulin detemir (LEVEMIR) 100 UNIT/ML injection Inject 20 Units into the skin at bedtime.     Historical Provider, MD  levothyroxine (SYNTHROID, LEVOTHROID) 137 MCG tablet Take 137 mcg by mouth daily. 01/30/16   Historical Provider, MD  loperamide (IMODIUM) 2 MG capsule Take 4 mg by mouth as needed for diarrhea or loose stools. Take 2 tablet by mouth daily as needed    Historical Provider, MD  nystatin (MYCOSTATIN) 100000 UNIT/ML suspension Take 5 mLs (500,000 Units total) by mouth 4 (four) times daily. 08/14/16   Donnetta Hutching, MD  polyethylene glycol [facility] / Ethelene Hal) packet  Take 17 g by mouth 2 (two) times daily. Give until he has daily soft stools.    Historical Provider, MD  Potassium Chloride ER 20 MEQ TBCR Take 1 tablet by mouth twice a day    Historical Provider, MD  senna (SENOKOT) 8.6 MG TABS tablet Take 1 tablet (8.6 mg total) by mouth daily. 03/11/16   Lavera Guise, MD  Skin Protectants, Misc. (EUCERIN) cream Apply topical to BLE and feet for dry skin twice daily and as needed.    Historical Provider, MD  tamsulosin (FLOMAX) 0.4 MG CAPS capsule Take 0.4 mg by mouth daily after supper.  06/03/16   Historical Provider, MD  Travoprost, BAK Free, (TRAVATAN) 0.004 % SOLN ophthalmic solution Place 1 drop into both eyes at bedtime.    Historical Provider, MD  vitamin B-12 (CYANOCOBALAMIN) 1000 MCG tablet Take one tablet by mouth once daily    Historical Provider, MD  XARELTO 15 MG TABS tablet Take 1 tablet once daily 04/12/16   Historical Provider, MD    Family History Family History  Problem Relation Age of Onset  . Hypertension Mother   . Cerebral aneurysm Mother   . Hypertension Brother   . Heart disease Father     Heart attack  . Cancer Neg Hx   . Diabetes Neg Hx     only among cousins    Social History Social History  Substance Use Topics  . Smoking status: Never Smoker  . Smokeless tobacco: Never Used  . Alcohol use No     Allergies   Thiazide-type diuretics   Review of Systems Review of Systems  All other systems reviewed and are negative.    Physical Exam Updated Vital Signs BP 127/71 (BP Location: Left Arm)   Pulse 69   Temp 98.9 F (37.2 C) (Oral)   Resp 16   Ht 6\' 4"  (1.93 m)   Wt 250 lb (113.4 kg)   SpO2 99%   BMI 30.43 kg/m   Physical Exam  Constitutional: He is oriented to person, place, and time. He appears well-developed and well-nourished.  HENT:  Head: Normocephalic and atraumatic.  Tongue is slightly erythematous toes, but no whitish plaques. No masses noted  Eyes: Conjunctivae are normal.  Neck: Neck supple.    Cardiovascular: Normal rate and regular rhythm.   Pulmonary/Chest: Effort normal and breath sounds normal.  Abdominal: Soft. Bowel sounds are normal.  Musculoskeletal: Normal range of motion.  Neurological: He is alert and oriented to person, place, and time.  Skin: Skin is warm and dry.  Psychiatric: He has a normal mood and affect. His behavior is normal.  Nursing note and vitals reviewed.    ED Treatments / Results  Labs (all labs ordered are listed, but only abnormal results are displayed) Labs Reviewed  CBG MONITORING, ED - Abnormal; Notable for the following:  Result Value   Glucose-Capillary 349 (*)    All other components within normal limits    EKG  EKG Interpretation None       Radiology No results found.  Procedures Procedures (including critical care time)  Medications Ordered in ED Medications - No data to display   Initial Impression / Assessment and Plan / ED Course  I have reviewed the triage vital signs and the nursing notes.  Pertinent labs & imaging results that were available during my care of the patient were reviewed by me and considered in my medical decision making (see chart for details).  Clinical Course    Will Rx for potential thrush with nystatin suspension. Patient is in no acute distress.  Final Clinical Impressions(s) / ED Diagnoses   Final diagnoses:  Thrush    New Prescriptions New Prescriptions   NYSTATIN (MYCOSTATIN) 100000 UNIT/ML SUSPENSION    Take 5 mLs (500,000 Units total) by mouth 4 (four) times daily.     Donnetta HutchingBrian Carolin Quang, MD 08/14/16 1705

## 2016-08-14 NOTE — Discharge Instructions (Signed)
Medication for thrush.  Follow up your doctor

## 2016-08-14 NOTE — ED Notes (Signed)
Warm salt water feels mouth and throat.

## 2016-08-14 NOTE — ED Triage Notes (Signed)
Per patient blood sugar 140's yesterday. Patient reports eating only small amount of food this morning. Blood sugar 349 in triage.

## 2016-08-14 NOTE — ED Triage Notes (Signed)
Patient c/o mouth being "slightly sore" and red in color. Denies any other symptoms. Denies any pain with talking or eating. Patient reports lips also being sore and red. Per patient used warm, salt water with some relief.

## 2016-08-19 ENCOUNTER — Ambulatory Visit (INDEPENDENT_AMBULATORY_CARE_PROVIDER_SITE_OTHER): Payer: Medicare Other | Admitting: Urology

## 2016-08-19 DIAGNOSIS — R339 Retention of urine, unspecified: Secondary | ICD-10-CM | POA: Diagnosis not present

## 2016-09-16 ENCOUNTER — Ambulatory Visit (INDEPENDENT_AMBULATORY_CARE_PROVIDER_SITE_OTHER): Payer: Medicare Other | Admitting: Urology

## 2016-09-16 DIAGNOSIS — R339 Retention of urine, unspecified: Secondary | ICD-10-CM | POA: Diagnosis not present

## 2016-10-21 ENCOUNTER — Ambulatory Visit (INDEPENDENT_AMBULATORY_CARE_PROVIDER_SITE_OTHER): Payer: Medicare Other | Admitting: Urology

## 2016-10-21 DIAGNOSIS — R339 Retention of urine, unspecified: Secondary | ICD-10-CM

## 2016-11-07 ENCOUNTER — Encounter (HOSPITAL_COMMUNITY): Payer: Self-pay | Admitting: Emergency Medicine

## 2016-11-07 ENCOUNTER — Emergency Department (HOSPITAL_COMMUNITY)
Admission: EM | Admit: 2016-11-07 | Discharge: 2016-11-07 | Disposition: A | Discharge status: Home / Self Care | Payer: Medicare Other | Attending: Emergency Medicine | Admitting: Emergency Medicine

## 2016-11-07 DIAGNOSIS — Z794 Long term (current) use of insulin: Secondary | ICD-10-CM | POA: Diagnosis not present

## 2016-11-07 DIAGNOSIS — I129 Hypertensive chronic kidney disease with stage 1 through stage 4 chronic kidney disease, or unspecified chronic kidney disease: Secondary | ICD-10-CM | POA: Insufficient documentation

## 2016-11-07 DIAGNOSIS — E1122 Type 2 diabetes mellitus with diabetic chronic kidney disease: Secondary | ICD-10-CM | POA: Diagnosis not present

## 2016-11-07 DIAGNOSIS — T83098A Other mechanical complication of other indwelling urethral catheter, initial encounter: Secondary | ICD-10-CM | POA: Diagnosis not present

## 2016-11-07 DIAGNOSIS — Z79899 Other long term (current) drug therapy: Secondary | ICD-10-CM | POA: Insufficient documentation

## 2016-11-07 DIAGNOSIS — N183 Chronic kidney disease, stage 3 (moderate): Secondary | ICD-10-CM | POA: Diagnosis not present

## 2016-11-07 DIAGNOSIS — R300 Dysuria: Secondary | ICD-10-CM | POA: Diagnosis not present

## 2016-11-07 DIAGNOSIS — E039 Hypothyroidism, unspecified: Secondary | ICD-10-CM | POA: Insufficient documentation

## 2016-11-07 DIAGNOSIS — T83031A Leakage of indwelling urethral catheter, initial encounter: Secondary | ICD-10-CM | POA: Diagnosis not present

## 2016-11-07 DIAGNOSIS — Y828 Other medical devices associated with adverse incidents: Secondary | ICD-10-CM | POA: Insufficient documentation

## 2016-11-07 DIAGNOSIS — Z8546 Personal history of malignant neoplasm of prostate: Secondary | ICD-10-CM | POA: Insufficient documentation

## 2016-11-07 DIAGNOSIS — T839XXA Unspecified complication of genitourinary prosthetic device, implant and graft, initial encounter: Secondary | ICD-10-CM

## 2016-11-07 LAB — URINALYSIS, ROUTINE W REFLEX MICROSCOPIC
BILIRUBIN URINE: NEGATIVE
Glucose, UA: NEGATIVE mg/dL
Nitrite: POSITIVE — AB
PROTEIN: 30 mg/dL — AB
SPECIFIC GRAVITY, URINE: 1.015 (ref 1.005–1.030)
pH: 6 (ref 5.0–8.0)

## 2016-11-07 LAB — URINE MICROSCOPIC-ADD ON

## 2016-11-07 NOTE — ED Triage Notes (Addendum)
Patient has foley cath placed. Per patient cath started leaking yesterday around penis. Patient also reports dysuria when fluid pass. Strong odor noted to urine and urine dark in color. Per patient had foley placed at urologist on November 15 and is to go back for appointment on December 15.

## 2016-11-07 NOTE — ED Notes (Signed)
Bladder scan revealed .  Attempted to irrigate existing foley with no success.  Began leaking around penis.  Catheter removed and new one placed without difficulty.

## 2016-11-07 NOTE — Discharge Instructions (Signed)
Your Foley catheter has been changed. He may have been a little bit clogged. Urine culture sent and urology can follow it and decide if it needs treatment

## 2016-11-07 NOTE — ED Provider Notes (Signed)
[facility] Provider Note   CSN: [id number] Arrival date & time: 11/07/16  1633     History   Chief Complaint Chief Complaint  Patient presents with  . Dysuria    HPI LYNETTE NOAH is a 82 y.o. male.  HPI Patient has a chronic foley catheter. He states that yesterday he began to have urine draining around his catheter. Some burning also. No fevers. Foley was changed 2 weeks ago. States the Foley has been able to be changed in the office and has been change in the ER before. No real abdominal pain.   Past Medical History:  Diagnosis Date  . Depression   . Diabetes mellitus   . Enlarged prostate    Status post TURP; self caths @ home  . Hypertension   . Hypothyroidism     Patient Active Problem List   Diagnosis Date Noted  . Hypokalemia 06/18/2016  . Scrotal swelling   . Acute kidney injury (HCC)   . DVT (deep venous thrombosis) (HCC) 04/13/2016  . Diastolic dysfunction 04/13/2016  . Edema 04/07/2016  . Wrist pain, right 03/24/2016  . Adult failure to thrive 03/24/2016  . AKI (acute kidney injury) (HCC) 03/20/2016  . Orthostatic hypotension 03/20/2016  . Protein-calorie malnutrition, severe 03/17/2016  . Dehydration 03/16/2016  . Hypernatremia 03/16/2016  . Diabetes mellitus type 2 with complications (HCC) 03/16/2016  . Depression 03/16/2016  . Thrombocytopenia, unspecified (HCC) 07/27/2014  . UTI (lower urinary tract infection) 06/24/2014  . CKD (chronic kidney disease) stage 3, GFR 30-59 ml/min 08/06/2013  . Chronic indwelling Foley catheter 08/06/2013  . Hypertension 10/02/2011  . Hypothyroidism 09/24/2011  . Anemia 09/24/2011  . BPH (benign prostatic hyperplasia) 09/24/2011    Past Surgical History:  Procedure Laterality Date  . HERNIA REPAIR    . TRANSURETHRAL RESECTION OF PROSTATE  09/22/2011   Procedure: TRANSURETHRAL RESECTION OF THE PROSTATE (TURP);  Surgeon: Ky Barban;  Location: [facility];  Service: Urology;  Laterality: N/A;        Home Medications    Prior to Admission medications   Medication Sig Start Date End Date Taking? Authorizing Provider  ALPRAZolam Prudy Feeler) 1 MG tablet Take 1 tablet (1 mg total) by mouth at bedtime as needed for sleep. 03/23/16  Yes Standley Brooking, MD  citalopram (CELEXA) 20 MG tablet Take 1 tablet (20 mg total) by mouth daily. 03/23/16  Yes Standley Brooking, MD  diltiazem (CARDIZEM CD) 300 MG 24 hr capsule Take 300 mg by mouth daily.    Yes Historical Provider, MD  furosemide (LASIX) 20 MG tablet TAKE 40 MG DAILY AS NEEDED FOR LEG SWELLING. Patient taking differently: Take 20 mg by mouth daily as needed for fluid. May take up to 3 times a day if needed. 06/19/16  Yes Erick Blinks, MD  insulin detemir (LEVEMIR) 100 UNIT/ML injection Inject 20 Units into the skin at bedtime.    Yes Historical Provider, MD  levothyroxine (SYNTHROID, LEVOTHROID) 137 MCG tablet Take 137 mcg by mouth daily. 01/30/16  Yes Historical Provider, MD  loperamide (IMODIUM) 2 MG capsule Take 4 mg by mouth as needed for diarrhea or loose stools. Take 2 tablet by mouth daily as needed   Yes Historical Provider, MD  Potassium Chloride ER 20 MEQ TBCR Take 1 tablet by mouth twice a day   Yes Historical Provider, MD  vitamin B-12 (CYANOCOBALAMIN) 1000 MCG tablet Take one tablet by mouth once daily   Yes Historical Provider, MD  XARELTO 15 MG TABS  tablet Take 1 tablet once daily 04/12/16  Yes Historical Provider, MD  fish oil-omega-3 fatty acids 1000 MG capsule Take 1 capsule (1 g total) by mouth 2 (two) times daily. 10/02/11   Christiane Haorinna L Sullivan, MD  senna (SENOKOT) 8.6 MG TABS tablet Take 1 tablet (8.6 mg total) by mouth daily. 03/11/16   Lavera Guiseana Duo Liu, MD  tamsulosin (FLOMAX) 0.4 MG CAPS capsule Take 0.4 mg by mouth daily after supper.  06/03/16   Historical Provider, MD  Travoprost, BAK Free, (TRAVATAN) 0.004 % SOLN ophthalmic solution Place 1 drop into both eyes at bedtime.    Historical Provider, MD    Family  History Family History  Problem Relation Age of Onset  . Hypertension Mother   . Cerebral aneurysm Mother   . Hypertension Brother   . Heart disease Father     Heart attack  . Cancer Neg Hx   . Diabetes Neg Hx     only among cousins    Social History Social History  Substance Use Topics  . Smoking status: Never Smoker  . Smokeless tobacco: Never Used  . Alcohol use No     Allergies   Thiazide-type diuretics   Review of Systems Review of Systems  Constitutional: Negative for fatigue.  Respiratory: Negative for shortness of breath.   Gastrointestinal: Negative for abdominal pain.  Genitourinary: Positive for dysuria. Negative for scrotal swelling and testicular pain.  Musculoskeletal: Negative for joint swelling.  Skin: Negative for wound.     Physical Exam Updated Vital Signs BP 140/75 (BP Location: Left Arm)   Pulse 85   Temp 98.3 F (36.8 C) (Oral)   Resp 18   Ht 6\' 4"  (1.93 m)   Wt 234 lb (106.1 kg)   SpO2 97%   BMI 28.48 kg/m   Physical Exam  Constitutional: He appears well-developed.  Cardiovascular: Normal rate.   Abdominal: There is no tenderness.  Genitourinary:  Genitourinary Comments: Foley catheter in place. No drainage from around Foley seen. Uncircumcised.  Musculoskeletal: He exhibits no tenderness.  Neurological: He is alert.  Skin: Skin is warm.     ED Treatments / Results  Labs (all labs ordered are listed, but only abnormal results are displayed) Labs Reviewed  URINALYSIS, ROUTINE W REFLEX MICROSCOPIC (NOT AT [facility]) - Abnormal; Notable for the following:       Result Value   Hgb urine dipstick LARGE (*)    Ketones, ur TRACE (*)    Protein, ur 30 (*)    Nitrite POSITIVE (*)    Leukocytes, UA LARGE (*)    All other components within normal limits  URINE MICROSCOPIC-ADD ON - Abnormal; Notable for the following:    Squamous Epithelial / LPF 0-5 (*)    Bacteria, UA MANY (*)    All other components within normal limits  URINE  CULTURE    EKG  EKG Interpretation None       Radiology No results found.  Procedures Procedures (including critical care time)  Medications Ordered in ED Medications - No data to display   Initial Impression / Assessment and Plan / ED Course  I have reviewed the triage vital signs and the nursing notes.  Pertinent labs & imaging results that were available during my care of the patient were reviewed by me and considered in my medical decision making (see chart for details).  Clinical Course     Patient with likely partially occluded Foley catheter. Had been draining around it. Changed and no more  drainage. Denies fevers. Urine culture sent and can be followed by urology if needed. doubt severe obstruction as there was still urine draining into the bag.  Final Clinical Impressions(s) / ED Diagnoses   Final diagnoses:  Foley catheter problem, initial encounter [facility](HCC)    New Prescriptions New Prescriptions   No medications on file     Benjiman CoreNathan Avondre Richens, MD 11/07/16 1801

## 2016-11-11 LAB — URINE CULTURE

## 2016-11-12 ENCOUNTER — Telehealth (HOSPITAL_BASED_OUTPATIENT_CLINIC_OR_DEPARTMENT_OTHER): Payer: Self-pay

## 2016-11-12 NOTE — Telephone Encounter (Signed)
Post ED Visit - Positive Culture Follow-up: Successful Patient Follow-Up  Culture assessed and recommendations reviewed by: []  Enzo BiNathan Batchelder, Pharm.D. []  Celedonio MiyamotoJeremy Frens, Pharm.D., BCPS []  Garvin FilaMike Maccia, Pharm.D. [x]  Georgina PillionElizabeth [name], Pharm.D., BCPS []  [place] FallsMinh Pham, [address] BoulevardPharm.D., BCPS, AAHIVP []  Estella HuskMichelle Turner, Pharm.D., BCPS, AAHIVP []  Tennis Mustassie Stewart, Pharm.D. []  Sherle Poeob Vincent, [address]Pharm[address]  Positive urine culture, >/= 100,000 colonies -> MRSA  [x]  Patient discharged without antimicrobial prescription and treatment is now indicated []  Organism is resistant to prescribed ED discharge antimicrobial []  Patient with positive blood cultures  Changes discussed with ED provider: Audry Piliyler Mohr PA-C Call patient to determine urologist -> Fax results over.  Will need repeat UCx at completion of therapy New antibiotic prescription Doxy 100 mg bid x 14 d Called to   Contacted patient, date 11/12/2016, time 1152  Left VM requesting callback.  [facility]    Garrett Bradley, Garrett Bradley 11/12/2016, 11:58 AM

## 2016-11-12 NOTE — Progress Notes (Signed)
ED Antimicrobial Stewardship Positive Culture Follow Up   Garrett Bradley is an 82 y.o. male who [facility] with a chief complaint of  Chief Complaint  Patient presents with  . Dysuria    Recent Results (from the past 720 hour(s))  Urine culture     Status: Abnormal   Collection Time: 11/07/16  4:48 PM  Result Value Ref Range Status   Specimen Description URINE, RANDOM  Final   Special Requests NONE  Final   Culture (A)  Final    >=100,000 COLONIES/mL METHICILLIN RESISTANT STAPHYLOCOCCUS AUREUS   Report Status 11/11/2016 FINAL  Final   Organism ID, Bacteria METHICILLIN RESISTANT STAPHYLOCOCCUS AUREUS (A)  Final      Susceptibility   Methicillin resistant staphylococcus aureus - MIC*    CIPROFLOXACIN >=8 RESISTANT Resistant     GENTAMICIN <=0.5 SENSITIVE Sensitive     NITROFURANTOIN <=16 SENSITIVE Sensitive     OXACILLIN >=4 RESISTANT Resistant     TETRACYCLINE <=1 SENSITIVE Sensitive     VANCOMYCIN <=0.5 SENSITIVE Sensitive     TRIMETH/SULFA <=10 SENSITIVE Sensitive     CLINDAMYCIN <=0.25 SENSITIVE Sensitive     RIFAMPIN <=0.5 SENSITIVE Sensitive     Inducible Clindamycin NEGATIVE Sensitive     * >=100,000 COLONIES/mL METHICILLIN RESISTANT STAPHYLOCOCCUS AUREUS    [x]  Patient discharged originally without antimicrobial agent and treatment is now indicated  [age] YOM with foley placed 11/15 d/t BPH who presented on 12/2 with dysuria, leaking, and burning around foley. Unable to irrigate, so foley removed/replaced. UCx grew MRSA - likely related to foley.   Plan:  - Call patient to determine urologist, fax results over, and have patient make a follow-up appointment - Start Doxy 100 mg bid x 14 days - Will need repeat urine cultures at the completion of therapy.   ED Provider: Audry Piliyler Mohr, PA-C  Rolley SimsMartin, Amina Menchaca Ann 11/12/2016, 10:21 AM Infectious Diseases Pharmacist Phone# [phone]

## 2016-11-25 ENCOUNTER — Ambulatory Visit (INDEPENDENT_AMBULATORY_CARE_PROVIDER_SITE_OTHER): Payer: Medicare Other | Admitting: Urology

## 2016-11-25 DIAGNOSIS — R339 Retention of urine, unspecified: Secondary | ICD-10-CM | POA: Diagnosis not present

## 2016-11-26 ENCOUNTER — Encounter (HOSPITAL_COMMUNITY): Payer: Self-pay

## 2016-11-26 ENCOUNTER — Emergency Department (HOSPITAL_COMMUNITY)
Admission: EM | Admit: 2016-11-26 | Discharge: 2016-11-26 | Disposition: A | Discharge status: Home / Self Care | Payer: Medicare Other | Attending: Emergency Medicine | Admitting: Emergency Medicine

## 2016-11-26 DIAGNOSIS — Z79899 Other long term (current) drug therapy: Secondary | ICD-10-CM | POA: Insufficient documentation

## 2016-11-26 DIAGNOSIS — I129 Hypertensive chronic kidney disease with stage 1 through stage 4 chronic kidney disease, or unspecified chronic kidney disease: Secondary | ICD-10-CM | POA: Diagnosis not present

## 2016-11-26 DIAGNOSIS — Z794 Long term (current) use of insulin: Secondary | ICD-10-CM | POA: Diagnosis not present

## 2016-11-26 DIAGNOSIS — N183 Chronic kidney disease, stage 3 (moderate): Secondary | ICD-10-CM | POA: Diagnosis not present

## 2016-11-26 DIAGNOSIS — E039 Hypothyroidism, unspecified: Secondary | ICD-10-CM | POA: Insufficient documentation

## 2016-11-26 DIAGNOSIS — Y733 Surgical instruments, materials and gastroenterology and urology devices (including sutures) associated with adverse incidents: Secondary | ICD-10-CM | POA: Diagnosis not present

## 2016-11-26 DIAGNOSIS — E1122 Type 2 diabetes mellitus with diabetic chronic kidney disease: Secondary | ICD-10-CM | POA: Diagnosis not present

## 2016-11-26 DIAGNOSIS — T83098A Other mechanical complication of other indwelling urethral catheter, initial encounter: Secondary | ICD-10-CM | POA: Diagnosis not present

## 2016-11-26 DIAGNOSIS — T839XXA Unspecified complication of genitourinary prosthetic device, implant and graft, initial encounter: Secondary | ICD-10-CM

## 2016-11-26 HISTORY — DX: Disorder of kidney and ureter, unspecified: N28.9

## 2016-11-26 NOTE — ED Notes (Signed)
Bladder scan show 452 ML of urine in bladder at this time.

## 2016-11-26 NOTE — ED Notes (Signed)
Patient states that he was placed on an antibiotic yesterday for a UTI, but he has not picked it up from the pharmacy.

## 2016-11-26 NOTE — ED Triage Notes (Signed)
Patient states that the urologist placed the cath around 2 pm yesterday and that his cath has only drained 200 ml since then.  Patient states that he is not having any discomfort, he is concerned that it stopped draining around midnight.  Patient states that he was drinking a lot of fluids when it was first placed yesterday, but he cut back when if felt like he was getting fullness in his bladder.

## 2016-11-26 NOTE — ED Provider Notes (Signed)
[facility]PT Provider Note   CSN: [id number] Arrival date & time: 11/26/16  0246     History   Chief Complaint Chief Complaint  Patient presents with  . Urinary Retention    HPI Garrett Bradley is a 82 y.o. male.  The history is provided by the patient.  patient presents with foley catheter complication He uses chronic indwelling foley He had it replaced yesterday at urologist office Later in the evening it appeared to stop flowing and he began having abd pain No other complaints Nothing improved his symptoms No fever/vomiting/back pain   Past Medical History:  Diagnosis Date  . Depression   . Diabetes mellitus   . Enlarged prostate    Status post TURP; self caths @ home  . Hypertension   . Hypothyroidism   . Renal disorder     Patient Active Problem List   Diagnosis Date Noted  . Hypokalemia 06/18/2016  . Scrotal swelling   . Acute kidney injury (HCC)   . DVT (deep venous thrombosis) (HCC) 04/13/2016  . Diastolic dysfunction 04/13/2016  . Edema 04/07/2016  . Wrist pain, right 03/24/2016  . Adult failure to thrive 03/24/2016  . AKI (acute kidney injury) (HCC) 03/20/2016  . Orthostatic hypotension 03/20/2016  . Protein-calorie malnutrition, severe 03/17/2016  . Dehydration 03/16/2016  . Hypernatremia 03/16/2016  . Diabetes mellitus type 2 with complications (HCC) 03/16/2016  . Depression 03/16/2016  . Thrombocytopenia, unspecified (HCC) 07/27/2014  . UTI (lower urinary tract infection) 06/24/2014  . CKD (chronic kidney disease) stage 3, GFR 30-59 ml/min 08/06/2013  . Chronic indwelling Foley catheter 08/06/2013  . Hypertension 10/02/2011  . Hypothyroidism 09/24/2011  . Anemia 09/24/2011  . BPH (benign prostatic hyperplasia) 09/24/2011    Past Surgical History:  Procedure Laterality Date  . HERNIA REPAIR    . TRANSURETHRAL RESECTION OF PROSTATE  09/22/2011   Procedure: TRANSURETHRAL RESECTION OF THE PROSTATE (TURP);  Surgeon: Ky BarbanMohammad I  Javaid;  Location: [facility];  Service: Urology;  Laterality: N/A;       Home Medications    Prior to Admission medications   Medication Sig Start Date End Date Taking? Authorizing Provider  ALPRAZolam Prudy Feeler(XANAX) 1 MG tablet Take 1 tablet (1 mg total) by mouth at bedtime as needed for sleep. 03/23/16   Standley Brookinganiel P Goodrich, MD  citalopram (CELEXA) 20 MG tablet Take 1 tablet (20 mg total) by mouth daily. 03/23/16   Standley Brookinganiel P Goodrich, MD  diltiazem (CARDIZEM CD) 300 MG 24 hr capsule Take 300 mg by mouth daily.     Historical Provider, MD  fish oil-omega-3 fatty acids 1000 MG capsule Take 1 capsule (1 g total) by mouth 2 (two) times daily. 10/02/11   Christiane Haorinna L Sullivan, MD  furosemide (LASIX) 20 MG tablet TAKE 40 MG DAILY AS NEEDED FOR LEG SWELLING. Patient taking differently: Take 20 mg by mouth daily as needed for fluid. May take up to 3 times a day if needed. 06/19/16   Erick BlinksJehanzeb Memon, MD  insulin detemir (LEVEMIR) 100 UNIT/ML injection Inject 20 Units into the skin at bedtime.     Historical Provider, MD  levothyroxine (SYNTHROID, LEVOTHROID) 137 MCG tablet Take 137 mcg by mouth daily. 01/30/16   Historical Provider, MD  loperamide (IMODIUM) 2 MG capsule Take 4 mg by mouth as needed for diarrhea or loose stools. Take 2 tablet by mouth daily as needed    Historical Provider, MD  Potassium Chloride ER 20 MEQ TBCR Take 1 tablet by mouth twice a day  Historical Provider, MD  senna (SENOKOT) 8.6 MG TABS tablet Take 1 tablet (8.6 mg total) by mouth daily. 03/11/16   Lavera Guiseana Duo Liu, MD  tamsulosin (FLOMAX) 0.4 MG CAPS capsule Take 0.4 mg by mouth daily after supper.  06/03/16   Historical Provider, MD  Travoprost, BAK Free, (TRAVATAN) 0.004 % SOLN ophthalmic solution Place 1 drop into both eyes at bedtime.    Historical Provider, MD  vitamin B-12 (CYANOCOBALAMIN) 1000 MCG tablet Take one tablet by mouth once daily    Historical Provider, MD  XARELTO 15 MG TABS tablet Take 1 tablet once daily 04/12/16   Historical  Provider, MD    Family History Family History  Problem Relation Age of Onset  . Hypertension Mother   . Cerebral aneurysm Mother   . Hypertension Brother   . Heart disease Father     Heart attack  . Cancer Neg Hx   . Diabetes Neg Hx     only among cousins    Social History Social History  Substance Use Topics  . Smoking status: Never Smoker  . Smokeless tobacco: Never Used  . Alcohol use No     Allergies   Thiazide-type diuretics   Review of Systems Review of Systems  Constitutional: Negative for fever.  Gastrointestinal: Negative for vomiting.  Genitourinary: Positive for difficulty urinating.  Musculoskeletal: Negative for back pain.     Physical Exam Updated Vital Signs BP 156/63 (BP Location: Left Arm)   Pulse 66   Temp 98.2 F (36.8 C) (Oral)   Ht 6\' 4"  (1.93 m)   Wt 106.1 kg   SpO2 98%   BMI 28.48 kg/m   Physical Exam CONSTITUTIONAL: elderly, no acute distress HEAD: Normocephalic/atraumatic EYES: EOMI/PERRL ENMT: Mucous membranes moist NECK: supple no meningeal signs SPINE/BACK:entire spine nontender CV: S1/S2 noted, no murmurs/rubs/gallops noted LUNGS: Lungs are clear to auscultation bilaterally, no apparent distress ABDOMEN: soft, nontender GU:no cva tenderness, foley cath in place.  No bleeding noted.  No signs of trauma.  No penile erosions noted NEURO: Pt is awake/alert/appropriate, moves all extremitiesx4.   EXTREMITIES:full ROM SKIN: warm, color normal PSYCH: no abnormalities of mood noted, alert and oriented to situation   ED Treatments / Results  Labs (all labs ordered are listed, but only abnormal results are displayed) Labs Reviewed - No data to display  EKG  EKG Interpretation None       Radiology No results found.  Procedures Procedures (including critical care time)  Medications Ordered in ED Medications - No data to display   Initial Impression / Assessment and Plan / ED Course  I have reviewed the triage  vital signs and the nursing notes.    Clinical Course     When I evaluated patient nurse had already flushed catheter Urine is now flowing without difficulty Pt feels improved He is scheduled to pick up antibiotics later today   Final Clinical Impressions(s) / ED Diagnoses   Final diagnoses:  Complication of Foley catheter, initial encounter [facility](HCC)    New Prescriptions New Prescriptions   No medications on file     Zadie Rhineonald Spike Desilets, MD 11/26/16 [phone]

## 2017-01-19 ENCOUNTER — Telehealth: Payer: Self-pay | Admitting: Emergency Medicine

## 2017-01-19 NOTE — Telephone Encounter (Signed)
Lost to followup 

## 2017-07-05 ENCOUNTER — Encounter (HOSPITAL_COMMUNITY): Payer: Self-pay | Admitting: Emergency Medicine

## 2017-07-05 ENCOUNTER — Emergency Department (HOSPITAL_COMMUNITY): Payer: Medicare Other

## 2017-07-05 ENCOUNTER — Other Ambulatory Visit: Payer: Self-pay | Admitting: Internal Medicine

## 2017-07-05 ENCOUNTER — Inpatient Hospital Stay (HOSPITAL_COMMUNITY)
Admission: EM | Admit: 2017-07-05 | Discharge: 2017-08-07 | DRG: 064 | Disposition: E | Payer: Medicare Other | Attending: Internal Medicine | Admitting: Internal Medicine

## 2017-07-05 DIAGNOSIS — R627 Adult failure to thrive: Secondary | ICD-10-CM | POA: Diagnosis not present

## 2017-07-05 DIAGNOSIS — R414 Neurologic neglect syndrome: Secondary | ICD-10-CM | POA: Diagnosis not present

## 2017-07-05 DIAGNOSIS — D638 Anemia in other chronic diseases classified elsewhere: Secondary | ICD-10-CM | POA: Diagnosis present

## 2017-07-05 DIAGNOSIS — R410 Disorientation, unspecified: Secondary | ICD-10-CM | POA: Diagnosis not present

## 2017-07-05 DIAGNOSIS — D696 Thrombocytopenia, unspecified: Secondary | ICD-10-CM | POA: Diagnosis not present

## 2017-07-05 DIAGNOSIS — I619 Nontraumatic intracerebral hemorrhage, unspecified: Secondary | ICD-10-CM | POA: Diagnosis not present

## 2017-07-05 DIAGNOSIS — K869 Disease of pancreas, unspecified: Secondary | ICD-10-CM | POA: Diagnosis present

## 2017-07-05 DIAGNOSIS — R0902 Hypoxemia: Secondary | ICD-10-CM

## 2017-07-05 DIAGNOSIS — Z9079 Acquired absence of other genital organ(s): Secondary | ICD-10-CM

## 2017-07-05 DIAGNOSIS — L89150 Pressure ulcer of sacral region, unstageable: Secondary | ICD-10-CM | POA: Diagnosis not present

## 2017-07-05 DIAGNOSIS — Z888 Allergy status to other drugs, medicaments and biological substances status: Secondary | ICD-10-CM

## 2017-07-05 DIAGNOSIS — Z978 Presence of other specified devices: Secondary | ICD-10-CM

## 2017-07-05 DIAGNOSIS — N179 Acute kidney failure, unspecified: Secondary | ICD-10-CM | POA: Diagnosis present

## 2017-07-05 DIAGNOSIS — E870 Hyperosmolality and hypernatremia: Secondary | ICD-10-CM | POA: Diagnosis not present

## 2017-07-05 DIAGNOSIS — I63432 Cerebral infarction due to embolism of left posterior cerebral artery: Principal | ICD-10-CM | POA: Diagnosis present

## 2017-07-05 DIAGNOSIS — E038 Other specified hypothyroidism: Secondary | ICD-10-CM

## 2017-07-05 DIAGNOSIS — Z9119 Patient's noncompliance with other medical treatment and regimen: Secondary | ICD-10-CM

## 2017-07-05 DIAGNOSIS — B370 Candidal stomatitis: Secondary | ICD-10-CM | POA: Diagnosis not present

## 2017-07-05 DIAGNOSIS — I10 Essential (primary) hypertension: Secondary | ICD-10-CM | POA: Diagnosis present

## 2017-07-05 DIAGNOSIS — B3781 Candidal esophagitis: Secondary | ICD-10-CM | POA: Diagnosis present

## 2017-07-05 DIAGNOSIS — E875 Hyperkalemia: Secondary | ICD-10-CM | POA: Diagnosis not present

## 2017-07-05 DIAGNOSIS — J90 Pleural effusion, not elsewhere classified: Secondary | ICD-10-CM | POA: Diagnosis not present

## 2017-07-05 DIAGNOSIS — J9601 Acute respiratory failure with hypoxia: Secondary | ICD-10-CM

## 2017-07-05 DIAGNOSIS — E43 Unspecified severe protein-calorie malnutrition: Secondary | ICD-10-CM | POA: Diagnosis present

## 2017-07-05 DIAGNOSIS — R29818 Other symptoms and signs involving the nervous system: Secondary | ICD-10-CM | POA: Diagnosis not present

## 2017-07-05 DIAGNOSIS — R404 Transient alteration of awareness: Secondary | ICD-10-CM | POA: Diagnosis not present

## 2017-07-05 DIAGNOSIS — L8990 Pressure ulcer of unspecified site, unspecified stage: Secondary | ICD-10-CM | POA: Insufficient documentation

## 2017-07-05 DIAGNOSIS — Z9289 Personal history of other medical treatment: Secondary | ICD-10-CM | POA: Diagnosis not present

## 2017-07-05 DIAGNOSIS — B9562 Methicillin resistant Staphylococcus aureus infection as the cause of diseases classified elsewhere: Secondary | ICD-10-CM | POA: Diagnosis present

## 2017-07-05 DIAGNOSIS — R6251 Failure to thrive (child): Secondary | ICD-10-CM

## 2017-07-05 DIAGNOSIS — R778 Other specified abnormalities of plasma proteins: Secondary | ICD-10-CM | POA: Diagnosis present

## 2017-07-05 DIAGNOSIS — E1121 Type 2 diabetes mellitus with diabetic nephropathy: Secondary | ICD-10-CM | POA: Diagnosis present

## 2017-07-05 DIAGNOSIS — E440 Moderate protein-calorie malnutrition: Secondary | ICD-10-CM | POA: Diagnosis not present

## 2017-07-05 DIAGNOSIS — R791 Abnormal coagulation profile: Secondary | ICD-10-CM

## 2017-07-05 DIAGNOSIS — I6340 Cerebral infarction due to embolism of unspecified cerebral artery: Secondary | ICD-10-CM | POA: Diagnosis not present

## 2017-07-05 DIAGNOSIS — E1165 Type 2 diabetes mellitus with hyperglycemia: Secondary | ICD-10-CM | POA: Diagnosis not present

## 2017-07-05 DIAGNOSIS — R531 Weakness: Secondary | ICD-10-CM | POA: Diagnosis not present

## 2017-07-05 DIAGNOSIS — E860 Dehydration: Secondary | ICD-10-CM | POA: Diagnosis present

## 2017-07-05 DIAGNOSIS — R4182 Altered mental status, unspecified: Secondary | ICD-10-CM | POA: Diagnosis present

## 2017-07-05 DIAGNOSIS — Z7189 Other specified counseling: Secondary | ICD-10-CM

## 2017-07-05 DIAGNOSIS — I4891 Unspecified atrial fibrillation: Secondary | ICD-10-CM | POA: Diagnosis not present

## 2017-07-05 DIAGNOSIS — A419 Sepsis, unspecified organism: Secondary | ICD-10-CM | POA: Diagnosis not present

## 2017-07-05 DIAGNOSIS — I480 Paroxysmal atrial fibrillation: Secondary | ICD-10-CM | POA: Diagnosis not present

## 2017-07-05 DIAGNOSIS — G9340 Encephalopathy, unspecified: Secondary | ICD-10-CM | POA: Diagnosis not present

## 2017-07-05 DIAGNOSIS — I2489 Other forms of acute ischemic heart disease: Secondary | ICD-10-CM

## 2017-07-05 DIAGNOSIS — Z6824 Body mass index (BMI) 24.0-24.9, adult: Secondary | ICD-10-CM

## 2017-07-05 DIAGNOSIS — G8250 Quadriplegia, unspecified: Secondary | ICD-10-CM | POA: Diagnosis present

## 2017-07-05 DIAGNOSIS — R748 Abnormal levels of other serum enzymes: Secondary | ICD-10-CM

## 2017-07-05 DIAGNOSIS — E039 Hypothyroidism, unspecified: Secondary | ICD-10-CM | POA: Diagnosis present

## 2017-07-05 DIAGNOSIS — Z79899 Other long term (current) drug therapy: Secondary | ICD-10-CM

## 2017-07-05 DIAGNOSIS — Z960 Presence of urogenital implants: Secondary | ICD-10-CM

## 2017-07-05 DIAGNOSIS — I639 Cerebral infarction, unspecified: Secondary | ICD-10-CM | POA: Diagnosis present

## 2017-07-05 DIAGNOSIS — E785 Hyperlipidemia, unspecified: Secondary | ICD-10-CM | POA: Diagnosis present

## 2017-07-05 DIAGNOSIS — I130 Hypertensive heart and chronic kidney disease with heart failure and stage 1 through stage 4 chronic kidney disease, or unspecified chronic kidney disease: Secondary | ICD-10-CM | POA: Diagnosis not present

## 2017-07-05 DIAGNOSIS — J189 Pneumonia, unspecified organism: Secondary | ICD-10-CM | POA: Diagnosis not present

## 2017-07-05 DIAGNOSIS — I248 Other forms of acute ischemic heart disease: Secondary | ICD-10-CM | POA: Diagnosis not present

## 2017-07-05 DIAGNOSIS — G9341 Metabolic encephalopathy: Secondary | ICD-10-CM | POA: Diagnosis present

## 2017-07-05 DIAGNOSIS — E118 Type 2 diabetes mellitus with unspecified complications: Secondary | ICD-10-CM | POA: Diagnosis present

## 2017-07-05 DIAGNOSIS — N183 Chronic kidney disease, stage 3 unspecified: Secondary | ICD-10-CM | POA: Diagnosis present

## 2017-07-05 DIAGNOSIS — Z7901 Long term (current) use of anticoagulants: Secondary | ICD-10-CM

## 2017-07-05 DIAGNOSIS — D649 Anemia, unspecified: Secondary | ICD-10-CM | POA: Diagnosis not present

## 2017-07-05 DIAGNOSIS — R7989 Other specified abnormal findings of blood chemistry: Secondary | ICD-10-CM | POA: Diagnosis not present

## 2017-07-05 DIAGNOSIS — Z515 Encounter for palliative care: Secondary | ICD-10-CM | POA: Diagnosis not present

## 2017-07-05 DIAGNOSIS — E876 Hypokalemia: Secondary | ICD-10-CM | POA: Diagnosis present

## 2017-07-05 DIAGNOSIS — Z66 Do not resuscitate: Secondary | ICD-10-CM | POA: Diagnosis present

## 2017-07-05 DIAGNOSIS — Z8249 Family history of ischemic heart disease and other diseases of the circulatory system: Secondary | ICD-10-CM

## 2017-07-05 DIAGNOSIS — R1310 Dysphagia, unspecified: Secondary | ICD-10-CM | POA: Diagnosis not present

## 2017-07-05 DIAGNOSIS — I429 Cardiomyopathy, unspecified: Secondary | ICD-10-CM | POA: Diagnosis present

## 2017-07-05 DIAGNOSIS — I5043 Acute on chronic combined systolic (congestive) and diastolic (congestive) heart failure: Secondary | ICD-10-CM | POA: Diagnosis present

## 2017-07-05 DIAGNOSIS — I452 Bifascicular block: Secondary | ICD-10-CM | POA: Diagnosis not present

## 2017-07-05 DIAGNOSIS — E1122 Type 2 diabetes mellitus with diabetic chronic kidney disease: Secondary | ICD-10-CM | POA: Diagnosis present

## 2017-07-05 DIAGNOSIS — N390 Urinary tract infection, site not specified: Secondary | ICD-10-CM | POA: Diagnosis present

## 2017-07-05 DIAGNOSIS — Z86718 Personal history of other venous thrombosis and embolism: Secondary | ICD-10-CM

## 2017-07-05 DIAGNOSIS — I6300 Cerebral infarction due to thrombosis of unspecified precerebral artery: Secondary | ICD-10-CM | POA: Diagnosis not present

## 2017-07-05 DIAGNOSIS — N400 Enlarged prostate without lower urinary tract symptoms: Secondary | ICD-10-CM | POA: Diagnosis not present

## 2017-07-05 DIAGNOSIS — I340 Nonrheumatic mitral (valve) insufficiency: Secondary | ICD-10-CM | POA: Diagnosis not present

## 2017-07-05 DIAGNOSIS — Z794 Long term (current) use of insulin: Secondary | ICD-10-CM

## 2017-07-05 DIAGNOSIS — Z431 Encounter for attention to gastrostomy: Secondary | ICD-10-CM

## 2017-07-05 DIAGNOSIS — G7281 Critical illness myopathy: Secondary | ICD-10-CM | POA: Diagnosis not present

## 2017-07-05 LAB — LACTIC ACID, PLASMA
Lactic Acid, Venous: 2.1 mmol/L (ref 0.5–1.9)
Lactic Acid, Venous: 2.2 mmol/L (ref 0.5–1.9)

## 2017-07-05 LAB — COMPREHENSIVE METABOLIC PANEL
ALT: 17 U/L (ref 17–63)
AST: 29 U/L (ref 15–41)
Albumin: 2.4 g/dL — ABNORMAL LOW (ref 3.5–5.0)
Alkaline Phosphatase: 64 U/L (ref 38–126)
Anion gap: 10 (ref 5–15)
BUN: 31 mg/dL — AB (ref 6–20)
CHLORIDE: 99 mmol/L — AB (ref 101–111)
CO2: 31 mmol/L (ref 22–32)
CREATININE: 1.31 mg/dL — AB (ref 0.61–1.24)
Calcium: 8.5 mg/dL — ABNORMAL LOW (ref 8.9–10.3)
GFR calc Af Amer: 57 mL/min — ABNORMAL LOW (ref >60)
GFR, EST NON AFRICAN AMERICAN: 49 mL/min — AB (ref >60)
GLUCOSE: 198 mg/dL — AB (ref 65–99)
Potassium: 3.2 mmol/L — ABNORMAL LOW (ref 3.5–5.1)
SODIUM: 140 mmol/L (ref 135–145)
Total Bilirubin: 0.9 mg/dL (ref 0.3–1.2)
Total Protein: 9.3 g/dL — ABNORMAL HIGH (ref 6.5–8.1)

## 2017-07-05 LAB — CBC WITH DIFFERENTIAL/PLATELET
Basophils Absolute: 0 10*3/uL (ref 0.0–0.1)
Basophils Relative: 0 %
Eosinophils Absolute: 0.2 10*3/uL (ref 0.0–0.7)
Eosinophils Relative: 2 %
HEMATOCRIT: 31.9 % — AB (ref 39.0–52.0)
Hemoglobin: 10.3 g/dL — ABNORMAL LOW (ref 13.0–17.0)
LYMPHS ABS: 1.2 10*3/uL (ref 0.7–4.0)
LYMPHS PCT: 12 %
MCH: 28.5 pg (ref 26.0–34.0)
MCHC: 32.3 g/dL (ref 30.0–36.0)
MCV: 88.4 fL (ref 78.0–100.0)
MONOS PCT: 8 %
Monocytes Absolute: 0.8 10*3/uL (ref 0.1–1.0)
NEUTROS ABS: 8 10*3/uL — AB (ref 1.7–7.7)
Neutrophils Relative %: 78 %
Platelets: 134 10*3/uL — ABNORMAL LOW (ref 150–400)
RBC: 3.61 MIL/uL — ABNORMAL LOW (ref 4.22–5.81)
RDW: 13.7 % (ref 11.5–15.5)
WBC: 10.2 10*3/uL (ref 4.0–10.5)

## 2017-07-05 LAB — TROPONIN I: Troponin I: 0.34 ng/mL (ref <0.03)

## 2017-07-05 LAB — URINALYSIS, ROUTINE W REFLEX MICROSCOPIC
Bilirubin Urine: NEGATIVE
GLUCOSE, UA: 50 mg/dL — AB
Ketones, ur: NEGATIVE mg/dL
Nitrite: NEGATIVE
PROTEIN: 100 mg/dL — AB
Specific Gravity, Urine: 1.015 (ref 1.005–1.030)
pH: 6 (ref 5.0–8.0)

## 2017-07-05 LAB — BRAIN NATRIURETIC PEPTIDE: B Natriuretic Peptide: 432 pg/mL — ABNORMAL HIGH (ref 0.0–100.0)

## 2017-07-05 MED ORDER — SODIUM CHLORIDE 0.9 % IV SOLN
INTRAVENOUS | Status: DC
  Administered 2017-07-05: 18:00:00 via INTRAVENOUS

## 2017-07-05 MED ORDER — DEXTROSE 5 % IV SOLN
1.0000 g | INTRAVENOUS | Status: DC
  Administered 2017-07-05 – 2017-07-08 (×4): 1 g via INTRAVENOUS
  Filled 2017-07-05 (×5): qty 10

## 2017-07-05 MED ORDER — SODIUM CHLORIDE 0.9 % IV BOLUS (SEPSIS)
250.0000 mL | Freq: Once | INTRAVENOUS | Status: AC
  Administered 2017-07-05: 250 mL via INTRAVENOUS

## 2017-07-05 MED ORDER — FLUCONAZOLE 100 MG PO TABS
100.0000 mg | ORAL_TABLET | Freq: Every day | ORAL | Status: AC
  Administered 2017-07-06: 100 mg via ORAL
  Filled 2017-07-05 (×3): qty 1

## 2017-07-05 MED ORDER — VANCOMYCIN HCL IN DEXTROSE 1-5 GM/200ML-% IV SOLN
1000.0000 mg | Freq: Two times a day (BID) | INTRAVENOUS | Status: DC
  Administered 2017-07-06: 1000 mg via INTRAVENOUS
  Filled 2017-07-05: qty 200

## 2017-07-05 MED ORDER — VANCOMYCIN HCL 10 G IV SOLR
1500.0000 mg | Freq: Once | INTRAVENOUS | Status: DC
  Filled 2017-07-05 (×2): qty 1500

## 2017-07-05 MED ORDER — ATORVASTATIN CALCIUM 40 MG PO TABS
40.0000 mg | ORAL_TABLET | Freq: Every day | ORAL | Status: DC
  Administered 2017-07-05: 40 mg via ORAL
  Filled 2017-07-05 (×4): qty 1

## 2017-07-05 NOTE — ED Notes (Addendum)
Date and time results received: 06/15/2017 8:20 PM  Test: Lactic Acid Critical Value: 2.2 Name of Provider Notified:Dr Clarene DukeMcManus Orders Received? Or Actions Taken?: Actions Taken: MD notified.

## 2017-07-05 NOTE — ED Notes (Addendum)
Undressed pt fully and gave pt bed bath.  Pt had feces from legs to waist, socks were soiled and crusted with exudate from swollen legs.  Pt had large amounts of stool in pants and in perineal area.  Shirt appeared dirty.  Foley catheter that was still in from December 2017 was still in pt and catheter itself was soiled.  Pt had large amount of sediment in tubing of foley catheter.  Pt also had armband on from his visit in December 2017.  Armband was cut off at time of arrival. Pt states that his brother occasionally stays with him and his family visits about once per week. Pt states he has not been changed in 2 weeks.

## 2017-07-05 NOTE — ED Notes (Signed)
Report given to Morgan, RN

## 2017-07-05 NOTE — ED Notes (Signed)
EKG given to Dr. McManus.  

## 2017-07-05 NOTE — H&P (Addendum)
History and Physical    Garrett Bradley:[id number] DOB: 04-08-1934 DOA: 07/25/2017  PCP: Assunta Found, MD Consultants:  Preminger, Urology Patient coming from: Home - lives alone; NOK: ?  Chief Complaint:  AMS  HPI: Garrett Bradley is a 83 y.o. male with medical history significant of hypothyroidism; HTN; BPH; and DM.   The patient has confusion and reports that he is here "Cause I got sick".  He thinks he is "at the doctor".  He denies pain.  No dysphagia.  No dysarthria.  Maybe some trouble remembering things.  HPI per Dr. Clarene Duke:  Pt was seen at 1425. Per EMS report and pt's family: Family called EMS because he did not answer the door today. EMS states pt was sitting in a chair, soiled in urine and stool, foley bag full of urine, hospital armband on pt's arm dated Dec 2017, meds bottles were dated with same date and were empty. Pt states he has not been changed in 2 weeks. Pt denies CP/SOB/abd pain.   ED Course: Patient filthy and neglected in appearance; APS consulted.  BNP mildly elevated with mild CHF on CKR, but patient appeared clinically dehydrated and so given judicious IVF.  EKG with new aifb but MRI with diffuse petechial hemorrhage and apparent cardioembolic CVA.  Needs admission to [facility] for neuro consult and thorough evaluation.  Review of Systems: As per HPI; otherwise 10 point review of systems reviewed and negative.  This is quite suspect given the patient's mental status.  Ambulatory Status:  Ambulates with a walker  Past Medical History:  Diagnosis Date  . Depression   . Diabetes mellitus   . Enlarged prostate    Status post TURP; self caths @ home  . Hypertension   . Hypothyroidism   . Renal disorder     Past Surgical History:  Procedure Laterality Date  . HERNIA REPAIR    . TRANSURETHRAL RESECTION OF PROSTATE  09/22/2011   Procedure: TRANSURETHRAL RESECTION OF THE PROSTATE (TURP);  Surgeon: Ky Barban;  Location: [facility];  Service: Urology;   Laterality: N/A;    Social History   Social History  . Marital status: Legally Separated    Spouse name: N/A  . Number of children: N/A  . Years of education: N/A   Occupational History  . Not on file.   Social History Main Topics  . Smoking status: Never Smoker  . Smokeless tobacco: Never Used  . Alcohol use No  . Drug use: No  . Sexual activity: Not Currently   Other Topics Concern  . Not on file   Social History Narrative  . No narrative on file    Allergies  Allergen Reactions  . Thiazide-Type Diuretics     03/2016 acute wrist pain, presumed gout Uric acid 10.4    Family History  Problem Relation Age of Onset  . Hypertension Mother   . Cerebral aneurysm Mother   . Hypertension Brother   . Heart disease Father        Heart attack  . Cancer Neg Hx   . Diabetes Neg Hx        only among cousins    Prior to Admission medications   Medication Sig Start Date End Date Taking? Authorizing Provider  ALPRAZolam Prudy Feeler) 1 MG tablet Take 1 tablet (1 mg total) by mouth at bedtime as needed for sleep. 03/23/16   Standley Brooking, MD  citalopram (CELEXA) 20 MG tablet Take 1 tablet (20 mg total) by mouth  daily. 03/23/16   Standley BrookingGoodrich, Daniel P, MD  diltiazem (CARDIZEM CD) 300 MG 24 hr capsule Take 300 mg by mouth daily.     [provider]  fish oil-omega-3 fatty acids 1000 MG capsule Take 1 capsule (1 g total) by mouth 2 (two) times daily. 10/02/11   Christiane HaSullivan, Corinna L, MD  furosemide (LASIX) 20 MG tablet TAKE 40 MG DAILY AS NEEDED FOR LEG SWELLING. Patient taking differently: Take 20 mg by mouth daily as needed for fluid. May take up to 3 times a day if needed. 06/19/16   Erick BlinksMemon, Jehanzeb, MD  insulin detemir (LEVEMIR) 100 UNIT/ML injection Inject 20 Units into the skin at bedtime.     [provider]  levothyroxine (SYNTHROID, LEVOTHROID) 137 MCG tablet Take 137 mcg by mouth daily. 01/30/16   [provider]  loperamide (IMODIUM) 2 MG capsule Take 4  mg by mouth as needed for diarrhea or loose stools. Take 2 tablet by mouth daily as needed    [provider]  Potassium Chloride ER 20 MEQ TBCR Take 1 tablet by mouth twice a day    [provider]  senna (SENOKOT) 8.6 MG TABS tablet Take 1 tablet (8.6 mg total) by mouth daily. 03/11/16   Lavera GuiseLiu, Dana Duo, MD  tamsulosin (FLOMAX) 0.4 MG CAPS capsule Take 0.4 mg by mouth daily after supper.  06/03/16   [provider]  Travoprost, BAK Free, (TRAVATAN) 0.004 % SOLN ophthalmic solution Place 1 drop into both eyes at bedtime.    [provider]  vitamin B-12 (CYANOCOBALAMIN) 1000 MCG tablet Take one tablet by mouth once daily    [provider]  XARELTO 15 MG TABS tablet Take 1 tablet once daily 04/12/16   [provider]    Physical Exam: Vitals:   12-22-2016 2130 12-22-2016 2223 12-22-2016 2230 12-22-2016 2300  BP: (!) 152/91 (!) 169/101 (!) 148/90   Pulse: (!) 26 78  (!) 41  Resp:  16    Temp:  98.2 F (36.8 C)    TempSrc:  Oral    SpO2: 96% 99%  100%  Weight:      Height:         General:  Appears disheveled and confused; he remains with very poor hygiene even after cleaning by nursing staff Eyes:  PERRL, EOMI, normal lids, iris ENT:  grossly normal hearing, significant oral thrush Neck:  no LAD, masses or thyromegaly Cardiovascular:  Irregularly irregular, rate controlled, no m/r/g. No LE edema.  Respiratory:  CTA bilaterally, no w/r/r. Normal respiratory effort. Abdomen:  soft, ntnd, NABS Skin:  no rash or induration seen on limited exam.  He had diffuse fecal crusting all over his B lower extremities. Musculoskeletal:  grossly normal tone BUE/BLE, good ROM, no bony abnormality Psychiatric:  Blunted mood and affect, speech slowed without apparent slurring, AOx2 Neurologic:  CN 2-12 grossly intact, moves all extremities in coordinated fashion, sensation intact  Labs on Admission: I have personally reviewed following labs and imaging  studies  CBC:  Recent Labs Lab 12-22-2016 1605  WBC 10.2  NEUTROABS 8.0*  HGB 10.3*  HCT 31.9*  MCV 88.4  PLT 134*   Basic Metabolic Panel:  Recent Labs Lab 12-22-2016 1605  NA 140  K 3.2*  CL 99*  CO2 31  GLUCOSE 198*  BUN 31*  CREATININE 1.31*  CALCIUM 8.5*   GFR: Estimated Creatinine Clearance: 58.7 mL/min (A) (by C-G formula based on SCr of 1.31 mg/dL (H)). Liver Function Tests:  Recent Labs Lab 07/11/17 1605  AST 29  ALT 17  ALKPHOS 64  BILITOT 0.9  PROT 9.3*  ALBUMIN 2.4*   No results for input(s): LIPASE, AMYLASE in the last 168 hours. No results for input(s): AMMONIA in the last 168 hours. Coagulation Profile: No results for input(s): INR, PROTIME in the last 168 hours. Cardiac Enzymes:  Recent Labs Lab 11-Jul-2017 1605  TROPONINI 0.34*   BNP (last 3 results) No results for input(s): PROBNP in the last 8760 hours. HbA1C: No results for input(s): HGBA1C in the last 72 hours. CBG: No results for input(s): GLUCAP in the last 168 hours. Lipid Profile: No results for input(s): CHOL, HDL, LDLCALC, TRIG, CHOLHDL, LDLDIRECT in the last 72 hours. Thyroid Function Tests: No results for input(s): TSH, T4TOTAL, FREET4, T3FREE, THYROIDAB in the last 72 hours. Anemia Panel: No results for input(s): VITAMINB12, FOLATE, FERRITIN, TIBC, IRON, RETICCTPCT in the last 72 hours. Urine analysis:    Component Value Date/Time   COLORURINE YELLOW 07/11/17 1439   APPEARANCEUR CLOUDY (A) July 11, 2017 1439   LABSPEC 1.015 Jul 11, 2017 1439   PHURINE 6.0 2017/07/11 1439   GLUCOSEU 50 (A) 07/11/2017 1439   HGBUR LARGE (A) 2017-07-11 1439   BILIRUBINUR NEGATIVE Jul 11, 2017 1439   KETONESUR NEGATIVE 07/11/17 1439   PROTEINUR 100 (A) 07-11-17 1439   UROBILINOGEN 0.2 07/28/2014 0736   NITRITE NEGATIVE 07/11/2017 1439   LEUKOCYTESUR LARGE (A) 2017/07/11 1439    Creatinine Clearance: Estimated Creatinine Clearance: 58.7 mL/min (A) (by C-G formula based on SCr of 1.31  mg/dL (H)).  Sepsis Labs: @LABRCNTIP (procalcitonin:4,lacticidven:4) )No results found for this or any previous visit (from the past 240 hour(s)).   Radiological Exams on Admission: Dg Chest 1 View  Result Date: 07-11-17 CLINICAL DATA:  Mental status change, incontinence. EXAM: CHEST 1 VIEW COMPARISON:  Chest x-ray of March 16, 2016 FINDINGS: The lungs are adequately inflated. The interstitial markings are mildly increased. There is no alveolar infiltrate or pleural effusion. The heart is top-normal in size. The central pulmonary vascularity is mildly prominent. There are degenerative changes of both shoulders. IMPRESSION: The findings suggest low-grade CHF.  There is no alveolar pneumonia. Electronically Signed   By: David  [place] M.D.   On: 2017/07/11 15:41   Ct Head Wo Contrast  Result Date: 2017/07/11 CLINICAL DATA:  Confusion with incontinence EXAM: CT HEAD WITHOUT CONTRAST TECHNIQUE: Contiguous axial images were obtained from the base of the skull through the vertex without intravenous contrast. COMPARISON:  July 26, 2014 FINDINGS: Brain: There is mild diffuse atrophy. There is a recent appearing infarct in the medial left occipital lobe with decreased attenuation. There is sulcal effacement in the posterior left parietal lobe, potentially a second focus of early infarct. There is no edema in this area appreciable. There is no mass, hemorrhage, or extra-axial fluid collection. No midline shift. Elsewhere, there is slight small vessel disease in the centra semiovale bilaterally. Vascular: There is subtle increased attenuation in the proximal left middle cerebral artery compared to prior study. This appearance may represent early hyperdense vessel and may be indicative of early acute infarct in the left middle cerebral artery distribution. There is calcification in each carotid siphon region. Skull: Bony calvarium appears intact. Sinuses/Orbits: Visualized paranasal sinuses are clear. Visualized  orbits appear symmetric bilaterally. Other: Mastoid air cells are clear. IMPRESSION: Recent appearing infarct in the left medial occipital lobe. Suspect early acute infarct in the left parietal lobe with questionable early hyperdense vessel change in the left middle cerebral artery. Given  these findings, correlation with MR pre and post-contrast advised to further evaluate. No mass or hemorrhage evident. No midline shift. Foci of calcification noted in each carotid siphon. Electronically Signed   By: Bretta Bang III M.D.   On: 06/18/2017 16:04   Mr Brain Wo Contrast (neuro Protocol)  Result Date: 06/19/2017 CLINICAL DATA:  Altered mental status and weakness EXAM: MRI HEAD WITHOUT CONTRAST MRA HEAD WITHOUT CONTRAST TECHNIQUE: Multiplanar, multiecho pulse sequences of the brain and surrounding structures were obtained without intravenous contrast. Angiographic images of the head were obtained using MRA technique without contrast. COMPARISON:  None. FINDINGS: MRI HEAD FINDINGS Brain: The midline structures are normal. There is a large area of diffusion restriction within the left posterior cerebral artery distribution. There are scattered other small foci of diffusion restriction within both cerebellar hemispheres, the left thalamus and anterior temporal lobe and multiple sites in the left parietal lobe and both frontal lobes. There is multifocal hyperintense T2-weighted signal within the periventricular white matter, most often seen in the setting of chronic microvascular ischemia. No mass lesion. There is petechial hemorrhage throughout the left PCA distribution. No hydrocephalus, age advanced atrophy or lobar predominant volume loss. No dural abnormality or extra-axial collection. Skull and upper cervical spine: The visualized skull base, calvarium, upper cervical spine and extracranial soft tissues are normal. Sinuses/Orbits: No fluid levels or advanced mucosal thickening. No mastoid effusion. Normal  orbits. MRA HEAD FINDINGS Intracranial internal carotid arteries: Normal. Anterior cerebral arteries: Normal. Middle cerebral arteries: Normal. Posterior communicating arteries: Present on the right. Posterior cerebral arteries: The left PCA is occluded in its mid P2 segment. The right PCA is normal. Basilar artery: Normal. Vertebral arteries: Codominant Normal. Superior cerebellar arteries: Normal. Anterior inferior cerebellar arteries: Normal. Posterior inferior cerebellar arteries: Normal. IMPRESSION: 1. Left PCA territory acute infarct with associated diffuse petechial hemorrhage throughout the infarct site, without mass effect. 2. Occlusion of left posterior cerebral artery at the mid P2 segment. 3. Scattered subcentimeter foci of acute ischemia within both cerebellar hemispheres, left thalamus, left anterior temporal lobe, left parietal lobe and both frontal lobes. The distribution suggests a central cardioembolic process. Electronically Signed   By: Deatra Robinson M.D.   On: 06/11/2017 17:46   Mr Maxine Glenn Head (cerebral Arteries)  Result Date: 06/18/2017 CLINICAL DATA:  Altered mental status and weakness EXAM: MRI HEAD WITHOUT CONTRAST MRA HEAD WITHOUT CONTRAST TECHNIQUE: Multiplanar, multiecho pulse sequences of the brain and surrounding structures were obtained without intravenous contrast. Angiographic images of the head were obtained using MRA technique without contrast. COMPARISON:  None. FINDINGS: MRI HEAD FINDINGS Brain: The midline structures are normal. There is a large area of diffusion restriction within the left posterior cerebral artery distribution. There are scattered other small foci of diffusion restriction within both cerebellar hemispheres, the left thalamus and anterior temporal lobe and multiple sites in the left parietal lobe and both frontal lobes. There is multifocal hyperintense T2-weighted signal within the periventricular white matter, most often seen in the setting of chronic  microvascular ischemia. No mass lesion. There is petechial hemorrhage throughout the left PCA distribution. No hydrocephalus, age advanced atrophy or lobar predominant volume loss. No dural abnormality or extra-axial collection. Skull and upper cervical spine: The visualized skull base, calvarium, upper cervical spine and extracranial soft tissues are normal. Sinuses/Orbits: No fluid levels or advanced mucosal thickening. No mastoid effusion. Normal orbits. MRA HEAD FINDINGS Intracranial internal carotid arteries: Normal. Anterior cerebral arteries: Normal. Middle cerebral arteries: Normal. Posterior communicating arteries: Present on the  right. Posterior cerebral arteries: The left PCA is occluded in its mid P2 segment. The right PCA is normal. Basilar artery: Normal. Vertebral arteries: Codominant Normal. Superior cerebellar arteries: Normal. Anterior inferior cerebellar arteries: Normal. Posterior inferior cerebellar arteries: Normal. IMPRESSION: 1. Left PCA territory acute infarct with associated diffuse petechial hemorrhage throughout the infarct site, without mass effect. 2. Occlusion of left posterior cerebral artery at the mid P2 segment. 3. Scattered subcentimeter foci of acute ischemia within both cerebellar hemispheres, left thalamus, left anterior temporal lobe, left parietal lobe and both frontal lobes. The distribution suggests a central cardioembolic process. Electronically Signed   By: Deatra Robinson M.D.   On: 07/03/2017 17:46    EKG: Independently reviewed.  Afib with rate 88; RBBB and LAFB, nonspecific ST changes with no evidence of acute ischemia but there is a lot of artifact noted  Assessment/Plan Principal Problem:   CVA (cerebral vascular accident) (HCC) Active Problems:   Hypothyroidism   Anemia   Thrush of mouth and esophagus (HCC)   Hypertension   CKD (chronic kidney disease) stage 3, GFR 30-59 ml/min   Chronic indwelling Foley catheter   Lower urinary tract infectious  disease   Diabetes mellitus type 2 with complications (HCC)   Protein-calorie malnutrition, severe   Adult failure to thrive   Atrial fibrillation (HCC)   Elevated troponin   CVA -Patient presenting with failure to thrive with confusion but discovered to have new-onset afib and on further evaluation appears to have cardioembolic strokes, likely related to his afib (see below) -Based on his multiple strokes and yet inability to anticoagulate due to petechial hemorrhaging, will admit to SDU at [facility] for CVA evaluation -He has left PCA occlusion on MRA; unlikely to be a candidate for neurosurgical intervention -Telemetry monitoring -Carotid dopplers -Echo -Risk stratification with FLP, A1c; will also check TSH and UDS -No ASA due to hemorrhaging -PT/OT/ST/Nutrition Consults -SW consult for placement  HTN -Allow permissive HTN -Treat BP only if >220/120, and then with goal of 15% reduction -While he has Cardizem listed on his medication list, it is not clear if he has been taking any medications since his pill bottles did not appear to have been filled since 12/17 -Suggest addition of ACE and/or beta blocker once appropriate to resume HTN medication  HLD -Check FLP -Will plan to start statin therapy empirically with Lipitor 40 mg for now -Previously taking fish oil; will hold  Failure to thrive/neglect -Despite report that family had been caring for him, the patient has been cleaned and is still covered in feces -His hospital bracelet from December remains on his arm -His pill bottles have not been refilled since December -It appears obvious that this man has not been effectively cared for -APS consult -He is likely to need placement at the time of discharge  DM -Glucose 198 -Last A1c was 8.8 in 5/17; will recheck -Continue Levemir -Cover with moderate-scale SSI  CKD -BUN 31/Creatinine 1.31/GFR 57 - improved from prior -Consideration addition of ACE at the time of  discharge  Malnutrition -Albumin 2.4; prior 3.0 in 8/17 -Nutrition consult  Afib, possible volume overload; elevated troponin -Most recent EKGs in Epic do not indicate afib -Current EKG and PE do indicate afib -Patient with apparent cardioembolic strokes -He is likely to need TEE but will start with Echo -BNP 432; he likely does have mild volume overload associated with chronic afib but will not diurese for now due to likely mild dehydration as well -Troponin 0.34; seems unlikely ACS  and is more likely associated with chronic new afib but will trend troponin and plan to consult cardiology if continuing to uptrend  UTI -UA: rare bacteria, 50 glucose, large Hgb, large LE, negative nitrite, 100 protein, TNTC RBC and WBC -Prior urine culture positive for MRSA in 12/17 -Lactate 2.1, repeat 2.2 -He has a chronic indwelling Foley which was filthy -Septic emboli from MRSA is another consideration; will cover with Vanc and Rocephin for now -Urine culture is pending -Will trend lactate, but patient does not have SIRS criteria at this time  Anemia -Hgb 10.3; stable -Will follow  Hypothyroidism -Check TSH and free T4 -Normal TSH in 7/17 -Continue Synthroid at current dose for now  Thrush -PO Diflucan daily x 3 days  DVT prophylaxis: SCDs Code Status: Full - confirmed with patient Family Communication: None present Disposition Plan: Very likely to need placement Consults called: Neurology; APS; PT/OT/ST/CM/SW/Nutrition Admission status: [facility] - It is my clinical opinion that admission to INPATIENT is reasonable and necessary because this patient will require at least 2 midnights in the hospital to treat this condition based on the medical complexity of the problems presented.  Given the aforementioned information, the predictability of an adverse outcome is felt to be significant.     Jonah BlueJennifer Ilia Engelbert MD [facility]  If 7PM-7AM, please contact  night-coverage [email or web address] Password [email or web address]  06/09/2017, 11:19 PM
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
## 2017-07-05 NOTE — ED Notes (Signed)
Still attempting to obtain IV access. Other ED RN attempting with ultrasound. Lab aware unable to get IV at this time and blood sample still needs to be collected.

## 2017-07-05 NOTE — ED Notes (Addendum)
Unable to reach Child psychotherapistsocial worker or case Production designer, theatre/television/filmmanager. Contacted [facility] [employer]. Left Voicemail with call back number.

## 2017-07-05 NOTE — Progress Notes (Addendum)
Pharmacy Antibiotic Note  Garrett Bradley is Bradley 83 y.o. male admitted on 23-May-2017 with h/o MRSA uti.  [name]  Plan: Vancomycin 1500mg  x 1 then 1000mg  IV q12hrs Rocephin 1gm IV q24hrs Monitor labs, progress, c/s  Height: 6\' 4"  (193 cm) Weight: 230 lb (104.3 kg) IBW/kg (Calculated) : 86.8  Temp (24hrs), Avg:98 F (36.7 C), Min:97.6 F (36.4 C), Max:98.2 F (36.8 C)   Recent Labs Lab 2017/01/05 1605 2017/01/05 1917  WBC 10.2  --   CREATININE 1.31*  --   LATICACIDVEN 2.1* 2.2*    Estimated Creatinine Clearance: 58.7 mL/min (Bradley) (by C-G formula based on SCr of 1.31 mg/dL (H)).    Allergies  Allergen Reactions  . Thiazide-Type Diuretics     03/2016 acute wrist pain, presumed gout Uric acid 10.4   Antimicrobials this admission: Vancomycin 7/30 >>  Rocephin 7/30  Dose adjustments this admission:  Microbiology results:  BCx:   UCx:  pending  Sputum:    MRSA PCR:   Thank you for allowing pharmacy to be Bradley part of this patient's care.  Garrett Bradley, Garrett Bradley 23-May-2017 9:38 PM

## 2017-07-05 NOTE — ED Notes (Signed)
Report given to Garrett Bradley with [facility] 

## 2017-07-05 NOTE — ED Notes (Addendum)
Pt son at bedside and reports "i live out of town and my sister lives in [place]." We have family and friends that look in on him." Pt son tearful and talking to pt and states "daddy you cant stay by yourself anymore, with the way you were today, i'm sure we are going to have to talk to social work and everything."   Pt son went to waiting room while obtaining chest xray and attempting to obtain IV access.

## 2017-07-05 NOTE — ED Notes (Signed)
Pt unable to sign pad for transfer

## 2017-07-05 NOTE — ED Triage Notes (Addendum)
Pt brought in via [facility], pt found in chair soiled with urine and stool. Pt alert and aware of place, self. cbg en route 155. Existing foley noted at time of arrival. Severe sediment noted to foley tubing.EMS reported foley bag full at time of there arrival. ED armband noted from 12.21.17 at time of arrival. Old armband removed and new armband placed. Pt denies pain.   Pt medication provided by EMS, prescriptions have not been filled since Dec 2017.

## 2017-07-05 NOTE — ED Notes (Signed)
Pt in MRI.

## 2017-07-05 NOTE — ED Notes (Addendum)
CRITICAL VALUE ALERT  Critical Value:  Troponin 0.34, Lactic Acid 2.1  Date & Time Notied:  06/07/2017 1715  Provider Notified: Dr. Clarene DukeMcManus  Orders Received/Actions taken: No new orders, EDP aware

## 2017-07-05 NOTE — ED Notes (Signed)
Dr. McManus giveClarene Duken "v-tach" alarm strips for pt.  Per Dr. Clarene DukeMcManus, pt is in A-fib.

## 2017-07-06 ENCOUNTER — Inpatient Hospital Stay (HOSPITAL_COMMUNITY): Payer: Medicare Other

## 2017-07-06 ENCOUNTER — Encounter (HOSPITAL_COMMUNITY): Payer: Self-pay | Admitting: Cardiology

## 2017-07-06 DIAGNOSIS — I480 Paroxysmal atrial fibrillation: Secondary | ICD-10-CM

## 2017-07-06 DIAGNOSIS — B370 Candidal stomatitis: Secondary | ICD-10-CM

## 2017-07-06 DIAGNOSIS — I6340 Cerebral infarction due to embolism of unspecified cerebral artery: Secondary | ICD-10-CM

## 2017-07-06 DIAGNOSIS — I639 Cerebral infarction, unspecified: Secondary | ICD-10-CM

## 2017-07-06 DIAGNOSIS — I429 Cardiomyopathy, unspecified: Secondary | ICD-10-CM

## 2017-07-06 DIAGNOSIS — B3781 Candidal esophagitis: Secondary | ICD-10-CM

## 2017-07-06 DIAGNOSIS — R6251 Failure to thrive (child): Secondary | ICD-10-CM

## 2017-07-06 DIAGNOSIS — I340 Nonrheumatic mitral (valve) insufficiency: Secondary | ICD-10-CM

## 2017-07-06 LAB — GLUCOSE, CAPILLARY
GLUCOSE-CAPILLARY: 92 mg/dL (ref 65–99)
GLUCOSE-CAPILLARY: 80 mg/dL (ref 65–99)
GLUCOSE-CAPILLARY: 99 mg/dL (ref 65–99)
GLUCOSE-CAPILLARY: 147 mg/dL — AB (ref 65–99)
Glucose-Capillary: 118 mg/dL — ABNORMAL HIGH (ref 65–99)

## 2017-07-06 LAB — PROCALCITONIN: PROCALCITONIN: 0.28 ng/mL

## 2017-07-06 LAB — TROPONIN I
TROPONIN I: 0.44 ng/mL — AB (ref <0.03)
Troponin I: 0.39 ng/mL (ref <0.03)
Troponin I: 0.48 ng/mL (ref <0.03)

## 2017-07-06 LAB — LACTIC ACID, PLASMA
LACTIC ACID, VENOUS: 1.1 mmol/L (ref 0.5–1.9)
LACTIC ACID, VENOUS: 0.9 mmol/L (ref 0.5–1.9)

## 2017-07-06 LAB — RAPID URINE DRUG SCREEN, HOSP PERFORMED
Amphetamines: NOT DETECTED
BARBITURATES: NOT DETECTED
BENZODIAZEPINES: NOT DETECTED
Cocaine: NOT DETECTED
Opiates: NOT DETECTED
TETRAHYDROCANNABINOL: NOT DETECTED

## 2017-07-06 LAB — PROTIME-INR
INR: 1.49
PROTHROMBIN TIME: 18.1 s — AB (ref 11.4–15.2)

## 2017-07-06 LAB — LIPID PANEL
CHOL/HDL RATIO: 4 ratio
CHOLESTEROL: 101 mg/dL (ref 0–200)
HDL: 25 mg/dL — ABNORMAL LOW (ref >40)
LDL Cholesterol: 62 mg/dL (ref 0–99)
Triglycerides: 69 mg/dL (ref <150)
VLDL: 14 mg/dL (ref 0–40)

## 2017-07-06 LAB — ECHOCARDIOGRAM COMPLETE
HEIGHTINCHES: 76 in
Weight: 3680 oz

## 2017-07-06 LAB — T4, FREE: FREE T4: 0.91 ng/dL (ref 0.61–1.12)

## 2017-07-06 LAB — APTT: APTT: 33 s (ref 24–36)

## 2017-07-06 LAB — TSH: TSH: 6.284 u[IU]/mL — ABNORMAL HIGH (ref 0.350–4.500)

## 2017-07-06 LAB — CK: CK TOTAL: 41 U/L — AB (ref 49–397)

## 2017-07-06 LAB — MRSA PCR SCREENING: MRSA by PCR: POSITIVE — AB

## 2017-07-06 MED ORDER — LEVOTHYROXINE SODIUM 25 MCG PO TABS
137.0000 ug | ORAL_TABLET | Freq: Every day | ORAL | Status: DC
  Administered 2017-07-07 – 2017-07-09 (×2): 137 ug via ORAL
  Filled 2017-07-06 (×3): qty 1

## 2017-07-06 MED ORDER — TAMSULOSIN HCL 0.4 MG PO CAPS
0.4000 mg | ORAL_CAPSULE | Freq: Every day | ORAL | Status: DC
  Administered 2017-07-06 – 2017-07-20 (×9): 0.4 mg via ORAL
  Filled 2017-07-06 (×11): qty 1

## 2017-07-06 MED ORDER — ACETAMINOPHEN 325 MG PO TABS
650.0000 mg | ORAL_TABLET | ORAL | Status: DC | PRN
  Filled 2017-07-06 (×3): qty 2

## 2017-07-06 MED ORDER — CHLORHEXIDINE GLUCONATE CLOTH 2 % EX PADS
6.0000 | MEDICATED_PAD | Freq: Every day | CUTANEOUS | Status: AC
  Administered 2017-07-08 – 2017-07-10 (×3): 6 via TOPICAL

## 2017-07-06 MED ORDER — INSULIN ASPART 100 UNIT/ML ~~LOC~~ SOLN: 0.0000 [IU] | Freq: Three times a day (TID) | SUBCUTANEOUS | Status: DC

## 2017-07-06 MED ORDER — ACETAMINOPHEN 650 MG RE SUPP
650.0000 mg | RECTAL | Status: DC | PRN
  Administered 2017-07-09: 650 mg via RECTAL
  Filled 2017-07-06: qty 1

## 2017-07-06 MED ORDER — POTASSIUM CHLORIDE CRYS ER 10 MEQ PO TBCR
20.0000 meq | EXTENDED_RELEASE_TABLET | Freq: Two times a day (BID) | ORAL | Status: DC
  Administered 2017-07-06 – 2017-07-07 (×5): 20 meq via ORAL
  Filled 2017-07-06 (×6): qty 2

## 2017-07-06 MED ORDER — MUPIROCIN 2 % EX OINT
1.0000 "application " | TOPICAL_OINTMENT | Freq: Two times a day (BID) | CUTANEOUS | Status: AC
  Administered 2017-07-06 – 2017-07-10 (×9): 1 via NASAL
  Filled 2017-07-06 (×2): qty 22

## 2017-07-06 MED ORDER — SILVER SULFADIAZINE 1 % EX CREA
TOPICAL_CREAM | Freq: Every day | CUTANEOUS | Status: DC
  Administered 2017-07-07 – 2017-07-08 (×2): 1 via TOPICAL
  Administered 2017-07-09 – 2017-07-21 (×14): via TOPICAL
  Administered 2017-07-22: 1 via TOPICAL
  Administered 2017-07-23: 11:00:00 via TOPICAL
  Administered 2017-07-24: 1 via TOPICAL
  Administered 2017-07-25: 10:00:00 via TOPICAL
  Filled 2017-07-06 (×4): qty 85

## 2017-07-06 MED ORDER — SODIUM CHLORIDE 0.9 % IV SOLN
1250.0000 mg | INTRAVENOUS | Status: DC
  Administered 2017-07-06 – 2017-07-08 (×3): 1250 mg via INTRAVENOUS
  Filled 2017-07-06 (×4): qty 1250

## 2017-07-06 MED ORDER — ACETAMINOPHEN 160 MG/5ML PO SOLN
650.0000 mg | ORAL | Status: DC | PRN
  Administered 2017-07-14 – 2017-07-25 (×8): 650 mg
  Filled 2017-07-06 (×10): qty 20.3

## 2017-07-06 MED ORDER — ATORVASTATIN CALCIUM 20 MG PO TABS
20.0000 mg | ORAL_TABLET | Freq: Every day | ORAL | Status: DC
  Administered 2017-07-09: 20 mg via ORAL
  Filled 2017-07-06: qty 1

## 2017-07-06 MED ORDER — INSULIN DETEMIR 100 UNIT/ML ~~LOC~~ SOLN
20.0000 [IU] | Freq: Every day | SUBCUTANEOUS | Status: DC
  Administered 2017-07-06 (×2): 20 [IU] via SUBCUTANEOUS
  Filled 2017-07-06 (×3): qty 0.2

## 2017-07-06 MED ORDER — CITALOPRAM HYDROBROMIDE 20 MG PO TABS
20.0000 mg | ORAL_TABLET | Freq: Every day | ORAL | Status: DC
  Administered 2017-07-06 – 2017-07-21 (×12): 20 mg via ORAL
  Filled 2017-07-06 (×14): qty 1

## 2017-07-06 MED ORDER — ALPRAZOLAM 0.5 MG PO TABS: 1.0000 mg | ORAL_TABLET | Freq: Every evening | ORAL | Status: DC | PRN

## 2017-07-06 MED ORDER — ORAL CARE MOUTH RINSE
15.0000 mL | Freq: Two times a day (BID) | OROMUCOSAL | Status: DC
  Administered 2017-07-06 – 2017-07-25 (×36): 15 mL via OROMUCOSAL

## 2017-07-06 MED ORDER — SENNA 8.6 MG PO TABS
1.0000 | ORAL_TABLET | Freq: Every day | ORAL | Status: DC
  Administered 2017-07-06 – 2017-07-21 (×11): 8.6 mg via ORAL
  Filled 2017-07-06 (×13): qty 1

## 2017-07-06 MED ORDER — STROKE: EARLY STAGES OF RECOVERY BOOK
Freq: Once | Status: AC
  Administered 2017-07-06: 1
  Filled 2017-07-06: qty 1

## 2017-07-06 MED ORDER — SODIUM CHLORIDE 0.9 % IV SOLN
INTRAVENOUS | Status: DC
  Administered 2017-07-06 – 2017-07-07 (×3): via INTRAVENOUS

## 2017-07-06 MED ORDER — LATANOPROST 0.005 % OP SOLN
1.0000 [drp] | Freq: Every day | OPHTHALMIC | Status: DC
  Administered 2017-07-06 – 2017-07-25 (×21): 1 [drp] via OPHTHALMIC
  Filled 2017-07-06 (×2): qty 2.5

## 2017-07-06 MED ORDER — ASPIRIN EC 325 MG PO TBEC
325.0000 mg | DELAYED_RELEASE_TABLET | Freq: Every day | ORAL | Status: DC
  Filled 2017-07-06 (×2): qty 1

## 2017-07-06 MED ORDER — ENSURE ENLIVE PO LIQD
237.0000 mL | Freq: Two times a day (BID) | ORAL | Status: DC
  Administered 2017-07-09: 237 mL via ORAL

## 2017-07-06 MED ORDER — INSULIN ASPART 100 UNIT/ML ~~LOC~~ SOLN: 0.0000 [IU] | Freq: Every day | SUBCUTANEOUS | Status: DC

## 2017-07-06 MED ORDER — SENNOSIDES-DOCUSATE SODIUM 8.6-50 MG PO TABS: 1.0000 | ORAL_TABLET | Freq: Every evening | ORAL | Status: DC | PRN

## 2017-07-06 NOTE — Progress Notes (Signed)
*  PRELIMINARY RESULTS* Vascular Ultrasound Carotid Duplex (Doppler) has been completed.  Preliminary findings: Bilateral: No significant (1-39%) ICA stenosis. Antegrade vertebral flow.    Farrel DemarkJill Eunice, RDMS, RVT  07/06/2017, 11:05 AM

## 2017-07-06 NOTE — Progress Notes (Addendum)
STROKE TEAM PROGRESS NOTE   HISTORY OF PRESENT ILLNESS (per record) Garrett Bradley is a 83 y.o. male with a history of diabetes, hypertension, hypothyroidism, and history of acute kidney injury, and DVT on Xarelto, also on diltiazem (no documented afib hx) who presented with approximately two days of vision problem for a couple of days.  He was found by his family to be confused, disheveled, and soiled. He was brought into the emergency department where a head CT was performed which showed a hypodensity in the left occipital lobe. MRI with large L PCA territory infarct and multifocal embolic infarcts.    LKW: Unclear  Patient was not administered IV t-PA secondary to unknown LKW.  He was admitted to the neuro ICU for further evaluation and treatment.   SUBJECTIVE (INTERVAL HISTORY) His TTE technologist is at the bedside performing TTE. On tele he appears to be in afib. I have asked for stat EKG but apparently not done yet.    OBJECTIVE Temp:  [97.2 F (36.2 C)-98.4 F (36.9 C)] 98.4 F (36.9 C) (07/31 0752) Pulse Rate:  [25-90] 67 (07/31 0752) Cardiac Rhythm: Atrial fibrillation;Bundle branch block (07/31 0600) Resp:  [16-23] 22 (07/31 0752) BP: (132-169)/(72-112) 138/107 (07/31 0752) SpO2:  [91 %-100 %] 91 % (07/31 0600) Weight:  [104.3 kg (230 lb)] 104.3 kg (230 lb) (07/30 1416)  CBC:  Recent Labs Lab Apr 22, 2017 1605  WBC 10.2  NEUTROABS 8.0*  HGB 10.3*  HCT 31.9*  MCV 88.4  PLT 134*    Basic Metabolic Panel:  Recent Labs Lab Apr 22, 2017 1605  NA 140  K 3.2*  CL 99*  CO2 31  GLUCOSE 198*  BUN 31*  CREATININE 1.31*  CALCIUM 8.5*    Lipid Panel:    Component Value Date/Time   CHOL 101 07/06/2017 0428   TRIG 69 07/06/2017 0428   HDL 25 (L) 07/06/2017 0428   CHOLHDL 4.0 07/06/2017 0428   VLDL 14 07/06/2017 0428   LDLCALC 62 07/06/2017 0428   HgbA1c:  Lab Results  Component Value Date   HGBA1C 8.8 (H) 04/13/2016   Urine Drug Screen:    Component Value  Date/Time   LABOPIA NONE DETECTED 07/28/2014 0736   COCAINSCRNUR NONE DETECTED 07/28/2014 0736   LABBENZ NONE DETECTED 07/28/2014 0736   AMPHETMU NONE DETECTED 07/28/2014 0736   THCU NONE DETECTED 07/28/2014 0736   LABBARB NONE DETECTED 07/28/2014 0736    Alcohol Level     Component Value Date/Time   ETH <5 03/16/2016 1358    IMAGING I have personally reviewed the radiological images below and agree with the radiology interpretations.  Dg Chest 1 View 01-24-17 IMPRESSION: The findings suggest low-grade CHF.  There is no alveolar pneumonia.    Ct Head Wo Contrast 01-24-17 IMPRESSION: Recent appearing infarct in the left medial occipital lobe. Suspect early acute infarct in the left parietal lobe with questionable early hyperdense vessel change in the left middle cerebral artery. Given these findings, correlation with MR pre and post-contrast advised to further evaluate. No mass or hemorrhage evident. No midline shift. Foci of calcification noted in each carotid siphon.  Mr Brain Wo Contrast (neuro Protocol) Mr Shirlee LatchMra Head (cerebral Arteries) 01-24-17 IMPRESSION: 1. Left PCA territory acute infarct with associated diffuse petechial hemorrhage throughout the infarct site, without mass effect. 2. Occlusion of left posterior cerebral artery at the mid P2 segment. 3. Scattered subcentimeter foci of acute ischemia within both cerebellar hemispheres, left thalamus, left anterior temporal lobe, left parietal lobe and both  frontal lobes. The distribution suggests a central cardioembolic process.   Cartoid US 07/06/2017 B ICA 1-39% stenosis, VAs antegrade  2D Echo 07/06/2017 - Left ventricle: The cavity size was normal. There was moderate   concentric hypertrophy. Systolic function was moderately reduced.   The estimated ejection fraction was in the range of 35% to 40%.   Moderate diffuse hypokinesis with no identifiable regional   variations. - Ventricular septum: Septal motion showed  abnormal function,   dyssynergy, and paradox. These changes are consistent with   intraventricular conduction delay. - Aortic valve: There was trivial regurgitation. - Mitral valve: There was mild regurgitation. - Left atrium: The atrium was mildly dilated. - Right atrium: The atrium was mildly dilated. - Pericardium, extracardiac: A trivial pericardial effusion was   identified. There was a left pleural effusion.   PHYSICAL EXAM  Temp:  [97.2 F (36.2 C)-98.7 F (37.1 C)] 98.6 F (37 C) (07/31 1908) Pulse Rate:  [25-78] 67 (07/31 0752) Resp:  [16-24] 20 (07/31 1908) BP: (132-169)/(72-107) 165/79 (07/31 1908) SpO2:  [91 %-100 %] 91 % (07/31 0600)  General - Well nourished, well developed, lethargic.  Ophthalmologic - Fundi not visualized due to noncooperation.  Cardiovascular - irregularly irregular heart rate and rhythm.  Neuro - lethargic, but eyes open and following commands, dysarthric, not orientated to place, time or age, but know his own name. Not able to name but able to repeat. Paucity of speech. Right upper quadrantanopia. No significant facial droop and tongue midline. Moving all extremities symmetrically, at least 3+/5. DTR 1+ and no babinski. Sensation symmetrical, coordination not cooperative and gait not tested.    ASSESSMENT/PLAN Mr. Garrett PanderJames C Bradley is a 83 y.o. male with history of diabetes, hypertension, hypothyroidism, and history of acute kidney injury who presented with approximately two days of vision problem for a couple of days. He did not receive IV t-PA due to unknown time LKW.   Stroke: multifocal infarcts bilateral anterior and posterior infarcts, cardioembolic pattern likely due to newly recognized afib.   Resultant  Right upper quadrantanopia, lethargy  CT head: Recent L medial occipital lobe infarct, acute L parietal infarct with questionable hyperdense L MCA.  MRI head:  L PCA infarcts with diffuse petechial hemorrhage, and scattered bilateral  subcentimeter acute infarcts in L thalamus, L anterior temporal lobe, and L parietal lobe and both frontal lobes  MRA head:  L P2 occlusion  Carotid Doppler: B ICA 1-39% stenosis, VAs antegrade  2D Echo  EF 35-40%  LDL 62   HgbA1c 8.8  SCDs for VTE prophylaxis  Diet Carb Modified Fluid consistency: Thin; Room service appropriate? Yes  No antithrombotic prior to admission, now on No antithrombotic. Will initiate ASA 325mg  at this time. Due to afib, we recommend anticoagulation for stroke prevention in 7 days due to moderate sized infarct and petechial hemorrhage. Once anticoagulatoin started, ASA can be discontinued.  Ongoing aggressive stroke risk factor management  Therapy recommendations:  pending  Disposition:  Pending  afib newly recognized  EKG and tele showed afib  Cardiology on board and help appreciated  Initiate on ASA, will recommend anticoagulation in 7 days due to moderate sized infarct and petechial hemorrhage. Once anticoagulation started, ASA can be stopped from stroke standpoint.  MRSA UTI  UA showed WBC TNTC  Urine culture positive for MRSA  On rocephin and vanco  Hx of DVT on Xarelto  Hx of DVT in 03/2016  Was on Xarelto  pt seems not on Xarelto PTA  Not  sure why his INR was 1.49 on admission  Repeat INR in am  Hypertension  Stable  Permissive hypertension (OK if < 220/120) but gradually normalize in 5-7 days  Long-term BP goal normotensive  Hyperlipidemia  Home meds:  none  LDL 62, goal < 70  Add atorvastatin 20mg  PO daily  Continue statin at discharge  Diabetes  HgbA1c 8.8, goal < 7.0  Uncontrolled  Hyperglycemia  On levemir  Likely not taking meds at home  Other Stroke Risk Factors  Advanced age  Other Active Problems  Elevated troponin: 0.3 ->0.39 ->0.44  Elevated Cre 1.31  Noncompliance   Neglect at home??  Hospital day # 1  Marvel Plan, MD PhD Stroke Neurology 07/06/2017 9:00 PM    To contact  Stroke Continuity provider, please refer to [email or web address]. After hours, contact General Neurology

## 2017-07-06 NOTE — Progress Notes (Addendum)
Initial Nutrition Assessment  DOCUMENTATION CODES:   Non-severe (moderate) malnutrition in context of chronic illness  INTERVENTION:    Ensure Enlive po BID, each supplement provides 350 kcal and 20 grams of protein  NUTRITION DIAGNOSIS:   Malnutrition (moderate) related to chronic illness (CKD, failure to thrive) as evidenced by moderate depletion of body fat, moderate depletions of muscle mass  GOAL:   Patient will meet greater than or equal to 90% of their needs  MONITOR:   PO intake, Supplement acceptance, Labs, Weight trends, Skin, I & O's  REASON FOR ASSESSMENT:   Consult Assessment of nutrition requirement/status (CVA)  ASSESSMENT:   83 y.o. Male with a history of diabetes, hypertension who states that he has had some trouble with his vision for a couple of days. Apparently was found by his family to be confused. He was found disheveled, and soiled. He was brought into the emergency department where a head CT was performed which showed a hypodensity in the left occipital lobe. An MRI was therefore performed which shows a large left PCA territory infarct as well as multifocal other likely embolic infarcts.   Pt eating breakfast upon RD visit. States his appetite is ok. Typically consumes 3 meals per day. He reports he drinks a "nutrtion supplement," however, unable to name.  Also feels he's lost weight but he is unable to quantify amount or time frame. Medications reviewed and include Levemir and ABX. Labs reviewed. CBG's [id number].  Nutrition-Focused physical exam completed. Findings are moderate fat depletion, moderate muscle depletion, and no edema.   Diet Order:  Diet Carb Modified Fluid consistency: Thin; Room service appropriate? Yes  Skin:  Wound (see comment) (brawny skin changes with poor hygiene)  Last BM:  7/30  Height:   Ht Readings from Last 1 Encounters:  26-Aug-2017 6\' 4"  (1.93 m)   Weight:   Wt Readings from Last 1 Encounters:  26-Aug-2017 230  lb (104.3 kg)   Ideal Body Weight:  86 kg  BMI:  Body mass index is 28 kg/m.  Estimated Nutritional Needs:   Kcal:  1800-2000  Protein:  90-100 gm  Fluid:  1.8-2.0 L  EDUCATION NEEDS:   No education needs identified at this time  Maureen ChattersKatie Marabelle Cushman, RD, LDN Pager #: [phone] After-Hours Pager #: [phone]

## 2017-07-06 NOTE — Evaluation (Signed)
Physical Therapy Evaluation Patient Details Name: Garrett PanderJames C Figler MRN: [id number] DOB: 03/21/1934 Today's Date: 07/06/2017   History of Present Illness  Pt adm from home with confusion. MRI showed large left PCA territory infarct as well as multifocal other likely embolic infarcts. When EMS called to the home pt found to have hospital armband on from 12/17. PMH - hypothyroidism; HTN; DM  Clinical Impression  Pt admitted with above diagnosis and presents to PT with functional limitations due to deficits listed below (See PT problem list). Pt needs skilled PT to maximize independence and safety to allow discharge to ST-SNF. Pt with poor social support at home and will need continued rehab at SNF to assist with recovery from CVA.     Follow Up Recommendations SNF    Equipment Recommendations  None recommended by PT    Recommendations for Other Services       Precautions / Restrictions Precautions Precautions: Fall Restrictions Weight Bearing Restrictions: No      Mobility  Bed Mobility Overal bed mobility: Needs Assistance Bed Mobility: Supine to Sit     Supine to sit: Max assist     General bed mobility comments: Assist to bring legs off bed, elevate trunk into sitting and bring hips to EOB. Seemed to need much of the assist due to cognitive impairment  Transfers Overall transfer level: Needs assistance Equipment used: Rolling walker (2 wheeled) Transfers: Sit to/from Stand Sit to Stand: +2 physical assistance;Min assist         General transfer comment: Assist to bring hips up and for balance. Verbal/tactile cues for hand placement  Ambulation/Gait Ambulation/Gait assistance: Min assist;+2 safety/equipment Ambulation Distance (Feet): 12 Feet Assistive device: Rolling walker (2 wheeled) Gait Pattern/deviations: Step-through pattern;Decreased step length - right;Decreased step length - left;Shuffle;Trunk flexed Gait velocity: decr Gait velocity interpretation: <1.8  ft/sec, indicative of risk for recurrent falls General Gait Details: Assist for balance and support and verbal cues for initial use of walker. Verbal cues to stand more erect. Pt with stable vital signs throughout.  Stairs            Wheelchair Mobility    Modified Rankin (Stroke Patients Only) Modified Rankin (Stroke Patients Only) Pre-Morbid Rankin Score: Moderately severe disability Modified Rankin: Slight disability     Balance Overall balance assessment: Needs assistance Sitting-balance support: Bilateral upper extremity supported;Feet supported Sitting balance-Leahy Scale: Poor Sitting balance - Comments: Initially pt with heavy rt lean in sitting. Then pt able to sit with supervision with UE support Postural control: Right lateral lean (on initial sitting) Standing balance support: Bilateral upper extremity supported Standing balance-Leahy Scale: Poor Standing balance comment: walker and min assist for static standing                             Pertinent Vitals/Pain Pain Assessment: No/denies pain    Home Living Family/patient expects to be discharged to:: Skilled nursing facility Living Arrangements: Alone   Type of Home: House Home Access: Ramped entrance     Home Layout: Two level;Laundry or work area in basement;Able to live on main level with bedroom/bathroom Home Equipment: Environmental consultantWalker - 2 wheels;Cane - single point;Crutches Additional Comments: Information from prior encounter with PT    Prior Function Level of Independence: Needs assistance   Gait / Transfers Assistance Needed: Amb with rolling walker           Hand Dominance   Dominant Hand: Right    Extremity/Trunk Assessment  Upper Extremity Assessment Upper Extremity Assessment: Defer to OT evaluation    Lower Extremity Assessment Lower Extremity Assessment: Generalized weakness       Communication   Communication: Expressive difficulties  Cognition Arousal/Alertness:  Awake/alert Behavior During Therapy: Flat affect Overall Cognitive Status: Impaired/Different from baseline Area of Impairment: Orientation;Following commands;Safety/judgement;Problem solving                 Orientation Level: Disoriented to;Place;Time;Situation     Following Commands: Follows one step commands with increased time;Follows one step commands inconsistently Safety/Judgement: Decreased awareness of safety;Decreased awareness of deficits   Problem Solving: Slow processing;Decreased initiation;Requires verbal cues;Requires tactile cues;Difficulty sequencing        General Comments      Exercises     Assessment/Plan    PT Assessment Patient needs continued PT services  PT Problem List Decreased strength;Decreased activity tolerance;Decreased balance;Decreased mobility;Decreased cognition;Decreased knowledge of use of DME;Decreased safety awareness       PT Treatment Interventions DME instruction;Gait training;Functional mobility training;Therapeutic activities;Therapeutic exercise;Balance training;Neuromuscular re-education;Cognitive remediation;Patient/family education    PT Goals (Current goals can be found in the Care Plan section)  Acute Rehab PT Goals Patient Stated Goal: Pt did not state. Agreeable to work toward incr mobility PT Goal Formulation: With patient Time For Goal Achievement: 07/20/17 Potential to Achieve Goals: Good    Frequency Min 3X/week   Barriers to discharge Decreased caregiver support Lives alone    Co-evaluation               AM-PAC PT "6 Clicks" Daily Activity  Outcome Measure Difficulty turning over in bed (including adjusting bedclothes, sheets and blankets)?: Total Difficulty moving from lying on back to sitting on the side of the bed? : Total Difficulty sitting down on and standing up from a chair with arms (e.g., wheelchair, bedside commode, etc,.)?: Total Help needed moving to and from a bed to chair (including  a wheelchair)?: A Little Help needed walking in hospital room?: A Little Help needed climbing 3-5 steps with a railing? : Total 6 Click Score: 10    End of Session Equipment Utilized During Treatment: Gait belt Activity Tolerance: Patient tolerated treatment well Patient left: in chair;with call bell/phone within reach;with chair alarm set Nurse Communication: Mobility status PT Visit Diagnosis: Unsteadiness on feet (R26.81);Muscle weakness (generalized) (M62.81);Other symptoms and signs involving the nervous system (R29.898)    Time: [id number] PT Time Calculation (min) (ACUTE ONLY): 25 min   Charges:   PT Evaluation $PT Eval Moderate Complexity: 1 Mod PT Treatments $Gait Training: 8-22 mins   PT G Codes:        [facility] Angy Swearengin PT [phone]   Angelina OkCary W [facility] 07/06/2017, 5:02 PM

## 2017-07-06 NOTE — Consult Note (Signed)
Cardiology Consultation:   Patient ID: TAIM WURM; [id number]; [birth year]   Admit date: Jul 06, 2017 Date of Consult: 07/06/2017  Primary Care Provider: Assunta Found, MD Primary Cardiologist: Dr. Darl Householder Primary Electrophysiologist: none    Patient Profile:   Garrett Bradley is a 83 y.o. male with a hx of cardiomyopathy with normalized EF, mild aortic root dilatation, HTN, DM, hypothyroidism and G1DD who is being seen today for the evaluation of atrial fib at the request of Dr. Catha Gosselin.  History of Present Illness:   Garrett Bradley with a hx of cardiomyopathy with normalized EF, mild aortic root dilatation, HTN,  DM, hypothyroidism, and G1DD presented to ER 2017/07/06 with confusion- he thought he was the doctor.  EMS had been called to his home as pt would not come to the door and he was found sitting in a chair soiled in urine and stool with foley bag full of urine.   His son thinks he was in chair since Sat until Monday.  (pt has had a foley cath for a year due to prostate issues.)  Also with nonocclusive left popliteal vein thrombosis for which he did take Xarelto a year ago.   Initial EKG read as AFib rate 88 but regular rhythm, artifact at baseline + RBBB and LAFB  Previous EKG 07/13/17 with SR and RBBB and LAFB rate 75.   Since this initial EKG - two other with SR with PACs and RBBB and LAFB.   It is at times difficult to see his P wave in some leads.   Tele:is irregular and does appear a fib.     EKGs and tele I personally reviewed.  CT head + recent infarct in Lt medial occipital lobe.  MRI Left PCA territory acute infarct with associated diffuse petechial hemorrhage throughout the infarct site, without mass effect.  Occlusion of left posterior cerebral artery at the mid P2 Segment.  Scattered subcentimeter foci of acute ischemia within both cerebellar hemispheres, left thalamus, left anterior temporal lobe, left parietal lobe and both frontal lobes. The distribution suggests a  central cardioembolic process.  Labs, with elevated Troponin pk 0.48, BNP 432 on admit  H/H 10.3/31.9 Cr 1.31, BUN 31, K+ 3.2   TSH  6.284, free T4 0.91    Currently sitting in chair answering questions but confused and poor historian with details. He denies chest pain but tells me he has SOB at night when he lies down.   He cannot answer questions about sitting in chair over weekend.    Past Medical History:  Diagnosis Date  . Depression   . Diabetes mellitus   . Enlarged prostate    Status post TURP; self caths @ home  . Hypertension   . Hypothyroidism   . Renal disorder     Past Surgical History:  Procedure Laterality Date  . HERNIA REPAIR    . TRANSURETHRAL RESECTION OF PROSTATE  09/22/2011   Procedure: TRANSURETHRAL RESECTION OF THE PROSTATE (TURP);  Surgeon: Ky Barban;  Location: [facility];  Service: Urology;  Laterality: N/A;     Inpatient Medications: Scheduled Meds: . atorvastatin  40 mg Oral q1800  . Chlorhexidine Gluconate Cloth  6 each Topical Q0600  . citalopram  20 mg Oral Daily  . feeding supplement (ENSURE ENLIVE)  237 mL Oral BID BM  . fluconazole  100 mg Oral Daily  . insulin aspart  0-15 Units Subcutaneous TID WC  . insulin aspart  0-5 Units Subcutaneous QHS  . insulin detemir  20 Units Subcutaneous QHS  . latanoprost  1 drop Both Eyes QHS  . levothyroxine  137 mcg Oral QAC breakfast  . mouth rinse  15 mL Mouth Rinse BID  . mupirocin ointment  1 application Nasal BID  . potassium chloride  20 mEq Oral BID  . senna  1 tablet Oral Daily  . silver sulfADIAZINE   Topical Daily  . tamsulosin  0.4 mg Oral QPC supper   Continuous Infusions: . sodium chloride 50 mL/hr at 07/06/17 0617  . cefTRIAXone (ROCEPHIN)  IV 1 g (Jun 29, 2017 2204)  . vancomycin     PRN Meds: acetaminophen **OR** acetaminophen (TYLENOL) oral liquid 160 mg/5 mL **OR** acetaminophen, ALPRAZolam, senna-docusate  Allergies:    Allergies  Allergen Reactions  . Thiazide-Type  Diuretics Other (See Comments)    03/2016 acute wrist pain, presumed gout Uric acid 10.4    Social History:   Social History   Social History  . Marital status: Legally Separated    Spouse name: N/A  . Number of children: N/A  . Years of education: N/A   Occupational History  . Not on file.   Social History Main Topics  . Smoking status: Never Smoker  . Smokeless tobacco: Never Used  . Alcohol use No  . Drug use: No  . Sexual activity: Not Currently   Other Topics Concern  . Not on file   Social History Narrative  . No narrative on file    Family History:   The patient's family history includes Cerebral aneurysm in his mother; Heart disease in his father; Hypertension in his brother and mother. There is no history of Cancer or Diabetes.  ROS:  Please see the history of present illness.  ROS  General:no colds or fevers,  weight down 8 lbs in a year. Skin:no rashes or ulcers HEENT:no blurred vision, no congestion CV:see HPI PUL:see HPI GI:no diarrhea constipation or melena, no indigestion GU:no hematuria, no dysuria MS:no joint pain, no claudication Neuro:no syncope, no lightheadedness Endo:+ diabetes, + thyroid disease      Physical Exam/Data:   Vitals:   07/06/17 0529 07/06/17 0600 07/06/17 0752 07/06/17 1233  BP: 136/76 (!) 132/91 (!) 138/107   Pulse: 68 (!) 25 67   Resp: 16 18 (!) 22   Temp: (!) 97.2 F (36.2 C)  98.4 F (36.9 C) 98.7 F (37.1 C)  TempSrc: Oral  Oral Oral  SpO2:  91%    Weight:      Height:        Intake/Output Summary (Last 24 hours) at 07/06/17 1542 Last data filed at 07/06/17 1300  Gross per 24 hour  Intake           469.17 ml  Output              200 ml  Net           269.17 ml   Filed Weights   Jun 29, 2017 1416  Weight: 230 lb (104.3 kg)   Body mass index is 28 kg/m.  General:  Gentleman sitting in chair looks somewhat confused but in no acute distress, odor of being unkept HEENT: normal Lymph: no adenopathy Neck: no  JVD Endocrine:  No thryomegaly Vascular: No carotid bruits; feet swollen  Cardiac:  irregular; RRR; no murmur gallup or rub  Lungs:  clear to auscultation bilaterally, no wheezing, rhonchi or rales  Abd: soft, nontender, no hepatomegaly  Ext: + edema, both legs wrapped, due to cracked feet and legs, brawny skin  and drainage, minimal per wound care nurse.  Also lymphedema. Musculoskeletal:  No deformities, BUE and BLE strength normal and equal Skin: warm and dry  Neuro:  Alert to person and place, follow commands, poor memory  Psych:  Normal to flat affect   Relevant CV Studies: ECHO 07/06/17  Study Conclusions  - Left ventricle: The cavity size was normal. There was moderate   concentric hypertrophy. Systolic function was moderately reduced.   The estimated ejection fraction was in the range of 35% to 40%.   Moderate diffuse hypokinesis with no identifiable regional   variations. - Ventricular septum: Septal motion showed abnormal function,   dyssynergy, and paradox. These changes are consistent with   intraventricular conduction delay. - Aortic valve: There was trivial regurgitation. - Mitral valve: There was mild regurgitation. - Left atrium: The atrium was mildly dilated. - Right atrium: The atrium was mildly dilated. - Pericardium, extracardiac: A trivial pericardial effusion was   identified. There was a left pleural effusion. NEW decrease in EF    Echo:05/06/16  Study Conclusions  - Left ventricle: The cavity size was normal. Wall thickness was   increased in a pattern of moderate LVH. Systolic function was   normal. The estimated ejection fraction was in the range of 55%   to 60%. Wall motion was normal; there were no regional wall   motion abnormalities. Doppler parameters are consistent with   abnormal left ventricular relaxation (grade 1 diastolic   dysfunction). - Aortic valve: Valve area (VTI): 4.3 cm^2. Valve area (Vmax): 3.8   cm^2. - Left atrium: The  atrium was mildly dilated. - Atrial septum: No defect or patent foramen ovale was identified. - Systemic veins: IVC is small, suggesting low RA pressure and   hypovolemia. - Technically adequate study.  Laboratory Data:  Chemistry Recent Labs Lab 2017/07/25 1605  NA 140  K 3.2*  CL 99*  CO2 31  GLUCOSE 198*  BUN 31*  CREATININE 1.31*  CALCIUM 8.5*  GFRNONAA 49*  GFRAA 57*  ANIONGAP 10     Recent Labs Lab Jul 25, 2017 1605  PROT 9.3*  ALBUMIN 2.4*  AST 29  ALT 17  ALKPHOS 64  BILITOT 0.9   Hematology Recent Labs Lab Jul 25, 2017 1605  WBC 10.2  RBC 3.61*  HGB 10.3*  HCT 31.9*  MCV 88.4  MCH 28.5  MCHC 32.3  RDW 13.7  PLT 134*   Cardiac Enzymes Recent Labs Lab July 25, 2017 1605 07/06/17 0146 07/06/17 0830 07/06/17 1337  TROPONINI 0.34* 0.39* 0.44* 0.48*   No results for input(s): TROPIPOC in the last 168 hours.  BNP Recent Labs Lab 07/25/2017 1605  BNP 432.0*    DDimer No results for input(s): DDIMER in the last 168 hours.  Radiology/Studies:  Dg Chest 1 View  Result Date: Jul 25, 2017 CLINICAL DATA:  Mental status change, incontinence. EXAM: CHEST 1 VIEW COMPARISON:  Chest x-ray of March 16, 2016 FINDINGS: The lungs are adequately inflated. The interstitial markings are mildly increased. There is no alveolar infiltrate or pleural effusion. The heart is top-normal in size. The central pulmonary vascularity is mildly prominent. There are degenerative changes of both shoulders. IMPRESSION: The findings suggest low-grade CHF.  There is no alveolar pneumonia. Electronically Signed   By: David  [place] M.D.   On: 2017/07/25 15:41   Ct Head Wo Contrast  Result Date: 07/25/17 CLINICAL DATA:  Confusion with incontinence EXAM: CT HEAD WITHOUT CONTRAST TECHNIQUE: Contiguous axial images were obtained from the base of the skull through the vertex  without intravenous contrast. COMPARISON:  July 26, 2014 FINDINGS: Brain: There is mild diffuse atrophy. There is a recent  appearing infarct in the medial left occipital lobe with decreased attenuation. There is sulcal effacement in the posterior left parietal lobe, potentially a second focus of early infarct. There is no edema in this area appreciable. There is no mass, hemorrhage, or extra-axial fluid collection. No midline shift. Elsewhere, there is slight small vessel disease in the centra semiovale bilaterally. Vascular: There is subtle increased attenuation in the proximal left middle cerebral artery compared to prior study. This appearance may represent early hyperdense vessel and may be indicative of early acute infarct in the left middle cerebral artery distribution. There is calcification in each carotid siphon region. Skull: Bony calvarium appears intact. Sinuses/Orbits: Visualized paranasal sinuses are clear. Visualized orbits appear symmetric bilaterally. Other: Mastoid air cells are clear. IMPRESSION: Recent appearing infarct in the left medial occipital lobe. Suspect early acute infarct in the left parietal lobe with questionable early hyperdense vessel change in the left middle cerebral artery. Given these findings, correlation with MR pre and post-contrast advised to further evaluate. No mass or hemorrhage evident. No midline shift. Foci of calcification noted in each carotid siphon. Electronically Signed   By: Bretta Bang III M.D.   On: 07/12/2017 16:04   Mr Brain Wo Contrast (neuro Protocol)  Result Date: Jul 12, 2017 CLINICAL DATA:  Altered mental status and weakness EXAM: MRI HEAD WITHOUT CONTRAST MRA HEAD WITHOUT CONTRAST TECHNIQUE: Multiplanar, multiecho pulse sequences of the brain and surrounding structures were obtained without intravenous contrast. Angiographic images of the head were obtained using MRA technique without contrast. COMPARISON:  None. FINDINGS: MRI HEAD FINDINGS Brain: The midline structures are normal. There is a large area of diffusion restriction within the left posterior cerebral  artery distribution. There are scattered other small foci of diffusion restriction within both cerebellar hemispheres, the left thalamus and anterior temporal lobe and multiple sites in the left parietal lobe and both frontal lobes. There is multifocal hyperintense T2-weighted signal within the periventricular white matter, most often seen in the setting of chronic microvascular ischemia. No mass lesion. There is petechial hemorrhage throughout the left PCA distribution. No hydrocephalus, age advanced atrophy or lobar predominant volume loss. No dural abnormality or extra-axial collection. Skull and upper cervical spine: The visualized skull base, calvarium, upper cervical spine and extracranial soft tissues are normal. Sinuses/Orbits: No fluid levels or advanced mucosal thickening. No mastoid effusion. Normal orbits. MRA HEAD FINDINGS Intracranial internal carotid arteries: Normal. Anterior cerebral arteries: Normal. Middle cerebral arteries: Normal. Posterior communicating arteries: Present on the right. Posterior cerebral arteries: The left PCA is occluded in its mid P2 segment. The right PCA is normal. Basilar artery: Normal. Vertebral arteries: Codominant Normal. Superior cerebellar arteries: Normal. Anterior inferior cerebellar arteries: Normal. Posterior inferior cerebellar arteries: Normal. IMPRESSION: 1. Left PCA territory acute infarct with associated diffuse petechial hemorrhage throughout the infarct site, without mass effect. 2. Occlusion of left posterior cerebral artery at the mid P2 segment. 3. Scattered subcentimeter foci of acute ischemia within both cerebellar hemispheres, left thalamus, left anterior temporal lobe, left parietal lobe and both frontal lobes. The distribution suggests a central cardioembolic process. Electronically Signed   By: Deatra Robinson M.D.   On: 2017-07-12 17:46   Mr Maxine Glenn Head (cerebral Arteries)  Result Date: 07-12-2017 CLINICAL DATA:  Altered mental status and weakness  EXAM: MRI HEAD WITHOUT CONTRAST MRA HEAD WITHOUT CONTRAST TECHNIQUE: Multiplanar, multiecho pulse sequences of the brain and surrounding  structures were obtained without intravenous contrast. Angiographic images of the head were obtained using MRA technique without contrast. COMPARISON:  None. FINDINGS: MRI HEAD FINDINGS Brain: The midline structures are normal. There is a large area of diffusion restriction within the left posterior cerebral artery distribution. There are scattered other small foci of diffusion restriction within both cerebellar hemispheres, the left thalamus and anterior temporal lobe and multiple sites in the left parietal lobe and both frontal lobes. There is multifocal hyperintense T2-weighted signal within the periventricular white matter, most often seen in the setting of chronic microvascular ischemia. No mass lesion. There is petechial hemorrhage throughout the left PCA distribution. No hydrocephalus, age advanced atrophy or lobar predominant volume loss. No dural abnormality or extra-axial collection. Skull and upper cervical spine: The visualized skull base, calvarium, upper cervical spine and extracranial soft tissues are normal. Sinuses/Orbits: No fluid levels or advanced mucosal thickening. No mastoid effusion. Normal orbits. MRA HEAD FINDINGS Intracranial internal carotid arteries: Normal. Anterior cerebral arteries: Normal. Middle cerebral arteries: Normal. Posterior communicating arteries: Present on the right. Posterior cerebral arteries: The left PCA is occluded in its mid P2 segment. The right PCA is normal. Basilar artery: Normal. Vertebral arteries: Codominant Normal. Superior cerebellar arteries: Normal. Anterior inferior cerebellar arteries: Normal. Posterior inferior cerebellar arteries: Normal. IMPRESSION: 1. Left PCA territory acute infarct with associated diffuse petechial hemorrhage throughout the infarct site, without mass effect. 2. Occlusion of left posterior cerebral  artery at the mid P2 segment. 3. Scattered subcentimeter foci of acute ischemia within both cerebellar hemispheres, left thalamus, left anterior temporal lobe, left parietal lobe and both frontal lobes. The distribution suggests a central cardioembolic process. Electronically Signed   By: Deatra RobinsonKevin  Herman M.D.   On: 2017/10/21 17:46    Assessment and Plan:   1. Acute CVA - also diffuse petechial hemorrhage through the infarct site - best not to anticoagulate  Per neuro for now.  2.   PAF- EKG on  Admit computer read as A fib but on tele does appear to be a fib- seems to be going in and out initially.  At times appears SR with PACs.  Rate is controlled.  CAH2DS2VASc  Is 7 and we would like to anticoagulate - will defer to neurology.  3.   Cardiomyopathy new with EF 35-40% down from 55-60%. , Will hold therapy for now until acute stroke improved.  Then ACE/ARB diuretic and BB.  4.    Acute low grade of CHF after sitting in stool and urine with CVA for unknown time euvolemic currently -chronic lower ext edema.  Not on diuretic here  5.    Elevated troponin sitting for unknown length of time and with CVA, though certainly multiple risk factors for CAD.  Outpt ischemic work up once improves.   6. DM-2 per IM  7.  HLD LDL 62 no statin as outpt. Now on lipitor 40.   8. HTN elevated here 141/101            Signed, Nada BoozerLaura Ingold, NP  07/06/2017 3:42 PM   Attending Note:   The patient was seen and examined.  Agree with assessment and plan as noted above.  Changes made to the above note as needed.  Patient seen and independently examined with Nada BoozerLaura Ingold, NP.   We discussed all aspects of the encounter. I agree with the assessment and plan as stated above.  Pt was found in a chair - had possibly been there for 1-2 days.    1.  Atrial fib:  ECG shows atrial fib.   He has what appears to be occasional NSR. He has had a stroke and will eventually need to be started on oral anticoagulation.  He  has evidence of hemorraghic transformation of his stroke so we will defer to neuro as to the timing of initiation of anticoagulation.  2.  Elevated Troponin:   Likely due to demand ischemia.  No symptoms that would suggest ACS. Would not pursue further evaluation given his current condition  3.   Acute systolic CHF:   May be rate related.  Unless he makes significant improvement, he is a poor candidate for invasive cardiac procedures.   Would treat with standard medical management for CHF.    I have spent a total of 40 minutes with patient reviewing hospital  notes , telemetry, EKGs, labs and examining patient as well as establishing an assessment and plan that was discussed with the patient. > 50% of time was spent in direct patient care.    Vesta Mixer, Montez Hageman., MD, [facility] 07/06/2017, 8:37 PM [address]  [address] Office [phone] Pager [phone]

## 2017-07-06 NOTE — Progress Notes (Signed)
   07/06/17 1235  Clinical Encounter Type  Visited With Patient  Visit Type Other (Comment) ([place] consult)  Spiritual Encounters  Spiritual Needs Emotional  Stress Factors  Patient Stress Factors Health changes  Introduction to Pt. Pt stated he did not request AD. Floor chaplain available should further services be requested.

## 2017-07-06 NOTE — Progress Notes (Signed)
PROGRESS NOTE    Garrett PanderJames C Bradley  HQI:[id number] DOB: 07/11/1934 DOA: 06/26/2017 PCP: [name], [name], MD   Chief Complaint  Patient presents with  . Failure To Thrive     Brief Narrative:  HPI on 06/08/2017 by Dr. [name] Garrett PanderJames C Bradley is a 83 y.o. male with medical history significant of hypothyroidism; HTN; BPH; and DM. The patient has confusion and reports that he is here "Cause I got sick".  He thinks he is "at the doctor". He denies pain.  No dysphagia.  No dysarthria.  Maybe some trouble remembering things. HPI per Dr. Clarene DukeMcManus:  Pt was seen at 1425. Per EMS report and pt's family: Family called EMS because he did not answer the door today. EMS states pt was sitting in a chair, soiled in urine and stool, foley bag full of urine, hospital armband on pt's arm dated Dec 2017, meds bottles were dated with same date and were empty. Pt states he has not been changed in 2 weeks. Pt denies CP/SOB/abd pain.   Assessment & Plan   Acute CVA -CT head showed recent appearing infarct in the left medial occipital lobe. Suspect early acute infarct in the left parietal lobe with questionable early hypodense vessel change in the left middle cerebral artery -MRI showed left PCA territory acute infarct with associated diffuse petechial hemorrhage through the infarct site. Occlusion of the left posterior cerebral artery at the mid P2 segment. Scattered subcentimeter foci of acute ischemia within both cerebral hemispheres, left thalamus, left anterior temporal lobe, parietal lobe and both frontal lobes, suggesting central cardioembolic process -Neurology consulted and appreciated, recommended using aspirin and avoiding anticoagulation at this time due to petechial hemorrhage -Echocardiogram: EF of 35-40%, moderate diffuse hypokinesis with no identifiable regional variations. Ventricular septum showed abnormal function, dssynergy and paradox- intraventricular conduction delay -Carotid Doppler: No  significant ICA stenosis, antegrade vertebral flow -Discussed with Dr. Roda ShuttersXu, who recommended cardiology consult for possible loop recorder.  Reviewed EKG, does not appear to be Afib, likely sinus rhythm with poor baseline, and sinus arrhythmia -LDL 62 -Continue aspirin, statin  Systolic CHF -Last echocardiogram May 2017 showed an EF of 55-60%, grade 1 diastolic dysfunction -Echocardiogram this admission showing an EF of 35-40% -Cardiology consulted and appreciated -BNP 432  Elevated troponin -Likely secondary to the above  -Suspect demand -Cardiology consulted and appreciated  Questionable atrial fibrillation versus sinus arrhythmia -Reviewed EKG, does not appear to have atrial fibrillation, appears to have poor baseline -As above, cardiology consulted and appreciated  Urinary tract infection -UA showed rare bacteria, large leukocytes, negative nitrates, TNTC WBC -UA pending -Patient also had an indwelling Foley catheter which appeared filthy on admission (as per HPI) -Continue vancomycin, ceftriaxone until cultures return  Essential hypertension -Allow for permissive hypertension given acute CVA -Seems the patient was taking Cardizem for hypertension in the past  Failure to thrive/neglect -Patient reported to been found in feces as well as his urine -Supposedly had his hospital bracelet on from December 2017. Patient also had pill bottles had not been filled since December, it is unknown whether he was taking his medications or not. -APS consulted -Will likely need SNF on discharge  Diabetes mellitus, type II -Continue Levemir, ISS, CBG monitoring -Hemoglobin A1c pending   Hypokalemia -Replaced, continue to monitor BMP  Moderate malnutrition  -nutrition consulted, continue supplements  Abnormal TSH/hypothyroidism -TSH 6.284, however FT4 0.91 -Patient should have repeat thyroid testing in 4-6 weeks once acute illnesses have resolved -Continue Synthroid  Chronic  normocytic  Anemia -hemoglobin appears to be at baseline, continue monitor CBC  Thrush -Continue Diflucan  DVT Prophylaxis  SCDs  Code Status: Full  Family Communication: None at bedside  Disposition Plan: Admitted. Pending cardiology and neurology recommendations/work up  Consultants Cardiology Neurology  Procedures  Echocardiogram Carotid doppler  Antibiotics   Anti-infectives    Start     Dose/Rate Route Frequency Ordered Stop   07/06/17 2200  vancomycin (VANCOCIN) 1,250 mg in sodium chloride 0.9 % 250 mL IVPB     1,250 mg 166.7 mL/hr over 90 Minutes Intravenous Every 24 hours 07/06/17 1020     07/06/17 1000  vancomycin (VANCOCIN) IVPB 1000 mg/200 mL premix  Status:  Discontinued     1,000 mg 200 mL/hr over 60 Minutes Intravenous Every 12 hours July 23, 2017 2138 07/06/17 1017   07/06/17 1000  fluconazole (DIFLUCAN) tablet 100 mg     100 mg Oral Daily 07-23-2017 2319 07/09/17 0959   2017-07-23 2200  vancomycin (VANCOCIN) 1,500 mg in sodium chloride 0.9 % 500 mL IVPB  Status:  Discontinued     1,500 mg 250 mL/hr over 120 Minutes Intravenous  Once 23-Jul-2017 2135 07/06/17 1017   Jul 23, 2017 2145  cefTRIAXone (ROCEPHIN) 1 g in dextrose 5 % 50 mL IVPB     1 g 100 mL/hr over 30 Minutes Intravenous Every 24 hours 23-Jul-2017 2144        Subjective:   [name] seen and examined today.  Patient currently confused. Denies chest pain, shortness of breath abdominal pain, nausea or vomiting, diarrhea or constipation.   Objective:   Vitals:   07/06/17 0529 07/06/17 0600 07/06/17 0752 07/06/17 1233  BP: 136/76 (!) 132/91 (!) 138/107   Pulse: 68 (!) 25 67   Resp: 16 18 (!) 22   Temp: (!) 97.2 F (36.2 C)  98.4 F (36.9 C) 98.7 F (37.1 C)  TempSrc: Oral  Oral Oral  SpO2:  91%    Weight:      Height:        Intake/Output Summary (Last 24 hours) at 07/06/17 1438 Last data filed at 07/06/17 1300  Gross per 24 hour  Intake           469.17 ml  Output              200 ml  Net            269.17 ml   Filed Weights   Jul 23, 2017 1416  Weight: 104.3 kg (230 lb)    Exam  General: Well developed, ill-appearing, NAD  HEENT: NCAT, PERRLA, EOMI, Anicteic Sclera, mucous membranes moist.   Neck: Supple, no JVD, no masses  Cardiovascular: S1 S2 auscultated, Irregular (however monitor shows SR with PVC)  Respiratory: Clear to auscultation bilaterally with equal chest rise  Abdomen: Soft, nontender, nondistended, + bowel sounds  Extremities: warm dry without cyanosis clubbing. Trace LE edema  Neuro: AAOx1 (self only), Right sided weakness 4/5, slow to respond  Skin: Venous stasis changes on lower extremities, fungal?, with odor   Psych: Appropriate, confused   Data Reviewed: I have personally reviewed following labs and imaging studies  CBC:  Recent Labs Lab 07-23-17 1605  WBC 10.2  NEUTROABS 8.0*  HGB 10.3*  HCT 31.9*  MCV 88.4  PLT 134*   Basic Metabolic Panel:  Recent Labs Lab July 23, 2017 1605  NA 140  K 3.2*  CL 99*  CO2 31  GLUCOSE 198*  BUN 31*  CREATININE 1.31*  CALCIUM 8.5*   GFR: Estimated  Creatinine Clearance: 58.7 mL/min (A) (by C-G formula based on SCr of 1.31 mg/dL (H)). Liver Function Tests:  Recent Labs Lab 06/08/2017 1605  AST 29  ALT 17  ALKPHOS 64  BILITOT 0.9  PROT 9.3*  ALBUMIN 2.4*   No results for input(s): LIPASE, AMYLASE in the last 168 hours. No results for input(s): AMMONIA in the last 168 hours. Coagulation Profile:  Recent Labs Lab 07/06/17 0146  INR 1.49   Cardiac Enzymes:  Recent Labs Lab 06/18/2017 1605 07/06/17 0146 07/06/17 0830  TROPONINI 0.34* 0.39* 0.44*   BNP (last 3 results) No results for input(s): PROBNP in the last 8760 hours. HbA1C: No results for input(s): HGBA1C in the last 72 hours. CBG:  Recent Labs Lab 07/06/17 0109 07/06/17 0752 07/06/17 1232  GLUCAP 118* 92 80   Lipid Profile:  Recent Labs  07/06/17 0428  CHOL 101  HDL 25*  LDLCALC 62  TRIG 69  CHOLHDL 4.0     Thyroid Function Tests:  Recent Labs  07/06/17 0146  TSH 6.284*  FREET4 0.91   Anemia Panel: No results for input(s): VITAMINB12, FOLATE, FERRITIN, TIBC, IRON, RETICCTPCT in the last 72 hours. Urine analysis:    Component Value Date/Time   COLORURINE YELLOW 06/30/2017 1439   APPEARANCEUR CLOUDY (A) 07/02/2017 1439   LABSPEC 1.015 06/21/2017 1439   PHURINE 6.0 06/07/2017 1439   GLUCOSEU 50 (A) 06/08/2017 1439   HGBUR LARGE (A) 06/29/2017 1439   BILIRUBINUR NEGATIVE 06/28/2017 1439   KETONESUR NEGATIVE 06/15/2017 1439   PROTEINUR 100 (A) 06/30/2017 1439   UROBILINOGEN 0.2 07/28/2014 0736   NITRITE NEGATIVE 06/17/2017 1439   LEUKOCYTESUR LARGE (A) 06/25/2017 1439   Sepsis Labs: @LABRCNTIP (procalcitonin:4,lacticidven:4)  ) Recent Results (from the past 240 hour(s))  MRSA PCR Screening     Status: Abnormal   Collection Time: 07/06/17  1:00 AM  Result Value Ref Range Status   MRSA by PCR POSITIVE (A) NEGATIVE Final    Comment:        The GeneXpert MRSA Assay (FDA approved for NASAL specimens only), is one component of a comprehensive MRSA colonization surveillance program. It is not intended to diagnose MRSA infection nor to guide or monitor treatment for MRSA infections. RESULT CALLED TO, READ BACK BY AND VERIFIED WITH: M. Lucretia RoersWOOD [phone] 07.31.2018 N. MORRIS       Radiology Studies: Dg Chest 1 View  Result Date: 06/16/2017 CLINICAL DATA:  Mental status change, incontinence. EXAM: CHEST 1 VIEW COMPARISON:  Chest x-ray of March 16, 2016 FINDINGS: The lungs are adequately inflated. The interstitial markings are mildly increased. There is no alveolar infiltrate or pleural effusion. The heart is top-normal in size. The central pulmonary vascularity is mildly prominent. There are degenerative changes of both shoulders. IMPRESSION: The findings suggest low-grade CHF.  There is no alveolar pneumonia. Electronically Signed   By: David  SwazilandJordan M.D.   On: 06/27/2017 15:41   Ct  Head Wo Contrast  Result Date: 06/20/2017 CLINICAL DATA:  Confusion with incontinence EXAM: CT HEAD WITHOUT CONTRAST TECHNIQUE: Contiguous axial images were obtained from the base of the skull through the vertex without intravenous contrast. COMPARISON:  July 26, 2014 FINDINGS: Brain: There is mild diffuse atrophy. There is a recent appearing infarct in the medial left occipital lobe with decreased attenuation. There is sulcal effacement in the posterior left parietal lobe, potentially a second focus of early infarct. There is no edema in this area appreciable. There is no mass, hemorrhage, or extra-axial fluid collection. No midline  shift. Elsewhere, there is slight small vessel disease in the centra semiovale bilaterally. Vascular: There is subtle increased attenuation in the proximal left middle cerebral artery compared to prior study. This appearance may represent early hyperdense vessel and may be indicative of early acute infarct in the left middle cerebral artery distribution. There is calcification in each carotid siphon region. Skull: Bony calvarium appears intact. Sinuses/Orbits: Visualized paranasal sinuses are clear. Visualized orbits appear symmetric bilaterally. Other: Mastoid air cells are clear. IMPRESSION: Recent appearing infarct in the left medial occipital lobe. Suspect early acute infarct in the left parietal lobe with questionable early hyperdense vessel change in the left middle cerebral artery. Given these findings, correlation with MR pre and post-contrast advised to further evaluate. No mass or hemorrhage evident. No midline shift. Foci of calcification noted in each carotid siphon. Electronically Signed   By: Bretta Bang III M.D.   On: 06/12/2017 16:04   Mr Brain Wo Contrast (neuro Protocol)  Result Date: 06/22/2017 CLINICAL DATA:  Altered mental status and weakness EXAM: MRI HEAD WITHOUT CONTRAST MRA HEAD WITHOUT CONTRAST TECHNIQUE: Multiplanar, multiecho pulse sequences of  the brain and surrounding structures were obtained without intravenous contrast. Angiographic images of the head were obtained using MRA technique without contrast. COMPARISON:  None. FINDINGS: MRI HEAD FINDINGS Brain: The midline structures are normal. There is a large area of diffusion restriction within the left posterior cerebral artery distribution. There are scattered other small foci of diffusion restriction within both cerebellar hemispheres, the left thalamus and anterior temporal lobe and multiple sites in the left parietal lobe and both frontal lobes. There is multifocal hyperintense T2-weighted signal within the periventricular white matter, most often seen in the setting of chronic microvascular ischemia. No mass lesion. There is petechial hemorrhage throughout the left PCA distribution. No hydrocephalus, age advanced atrophy or lobar predominant volume loss. No dural abnormality or extra-axial collection. Skull and upper cervical spine: The visualized skull base, calvarium, upper cervical spine and extracranial soft tissues are normal. Sinuses/Orbits: No fluid levels or advanced mucosal thickening. No mastoid effusion. Normal orbits. MRA HEAD FINDINGS Intracranial internal carotid arteries: Normal. Anterior cerebral arteries: Normal. Middle cerebral arteries: Normal. Posterior communicating arteries: Present on the right. Posterior cerebral arteries: The left PCA is occluded in its mid P2 segment. The right PCA is normal. Basilar artery: Normal. Vertebral arteries: Codominant Normal. Superior cerebellar arteries: Normal. Anterior inferior cerebellar arteries: Normal. Posterior inferior cerebellar arteries: Normal. IMPRESSION: 1. Left PCA territory acute infarct with associated diffuse petechial hemorrhage throughout the infarct site, without mass effect. 2. Occlusion of left posterior cerebral artery at the mid P2 segment. 3. Scattered subcentimeter foci of acute ischemia within both cerebellar  hemispheres, left thalamus, left anterior temporal lobe, left parietal lobe and both frontal lobes. The distribution suggests a central cardioembolic process. Electronically Signed   By: Deatra Robinson M.D.   On: 06/21/2017 17:46   Mr Maxine Glenn Head (cerebral Arteries)  Result Date: 06/28/2017 CLINICAL DATA:  Altered mental status and weakness EXAM: MRI HEAD WITHOUT CONTRAST MRA HEAD WITHOUT CONTRAST TECHNIQUE: Multiplanar, multiecho pulse sequences of the brain and surrounding structures were obtained without intravenous contrast. Angiographic images of the head were obtained using MRA technique without contrast. COMPARISON:  None. FINDINGS: MRI HEAD FINDINGS Brain: The midline structures are normal. There is a large area of diffusion restriction within the left posterior cerebral artery distribution. There are scattered other small foci of diffusion restriction within both cerebellar hemispheres, the left thalamus and anterior temporal lobe and  multiple sites in the left parietal lobe and both frontal lobes. There is multifocal hyperintense T2-weighted signal within the periventricular white matter, most often seen in the setting of chronic microvascular ischemia. No mass lesion. There is petechial hemorrhage throughout the left PCA distribution. No hydrocephalus, age advanced atrophy or lobar predominant volume loss. No dural abnormality or extra-axial collection. Skull and upper cervical spine: The visualized skull base, calvarium, upper cervical spine and extracranial soft tissues are normal. Sinuses/Orbits: No fluid levels or advanced mucosal thickening. No mastoid effusion. Normal orbits. MRA HEAD FINDINGS Intracranial internal carotid arteries: Normal. Anterior cerebral arteries: Normal. Middle cerebral arteries: Normal. Posterior communicating arteries: Present on the right. Posterior cerebral arteries: The left PCA is occluded in its mid P2 segment. The right PCA is normal. Basilar artery: Normal. Vertebral  arteries: Codominant Normal. Superior cerebellar arteries: Normal. Anterior inferior cerebellar arteries: Normal. Posterior inferior cerebellar arteries: Normal. IMPRESSION: 1. Left PCA territory acute infarct with associated diffuse petechial hemorrhage throughout the infarct site, without mass effect. 2. Occlusion of left posterior cerebral artery at the mid P2 segment. 3. Scattered subcentimeter foci of acute ischemia within both cerebellar hemispheres, left thalamus, left anterior temporal lobe, left parietal lobe and both frontal lobes. The distribution suggests a central cardioembolic process. Electronically Signed   By: Deatra Robinson M.D.   On: 06/06/2017 17:46     Scheduled Meds: . atorvastatin  40 mg Oral q1800  . Chlorhexidine Gluconate Cloth  6 each Topical Q0600  . citalopram  20 mg Oral Daily  . feeding supplement (ENSURE ENLIVE)  237 mL Oral BID BM  . fluconazole  100 mg Oral Daily  . insulin aspart  0-15 Units Subcutaneous TID WC  . insulin aspart  0-5 Units Subcutaneous QHS  . insulin detemir  20 Units Subcutaneous QHS  . latanoprost  1 drop Both Eyes QHS  . levothyroxine  137 mcg Oral QAC breakfast  . mouth rinse  15 mL Mouth Rinse BID  . mupirocin ointment  1 application Nasal BID  . potassium chloride  20 mEq Oral BID  . senna  1 tablet Oral Daily  . silver sulfADIAZINE   Topical Daily  . tamsulosin  0.4 mg Oral QPC supper   Continuous Infusions: . sodium chloride 50 mL/hr at 07/06/17 0617  . cefTRIAXone (ROCEPHIN)  IV 1 g (06/09/2017 2204)  . vancomycin       LOS: 1 day   Time Spent in minutes   45 minutes  Jaksen Fiorella D.O. on 07/06/2017 at 2:38 PM  Between 7am to 7pm - Pager - [phone]  After 7pm go to [email or web address] - password [email or web address]  And look for the night coverage person covering for me after hours  [facility] Office  [phone]

## 2017-07-06 NOTE — Progress Notes (Signed)
  Echocardiogram 2D Echocardiogram has been performed.  Delcie RochENNINGTON, Laban Orourke 07/06/2017, 10:55 AM

## 2017-07-06 NOTE — Consult Note (Signed)
Neurology Consultation Reason for Consult: Stroke Referring Physician: Basil DessYates, J  CC: Stroke  History is obtained from: Patient, medical record  HPI: Garrett Bradley is a 83 y.o. male with a history of diabetes, hypertension who states that he has had some trouble with his vision for a couple of days. Apparently was found by his family to be confused. He was found disheveled, and soiled. He was brought into the emergency department where a head CT was performed which showed a hypodensity in the left occipital lobe. An MRI was therefore performed which shows a large left PCA territory infarct as well as multifocal other likely embolic infarcts.   LKW: Unclear tpa given?: no, unclear time of onset   ROS: A 14 point ROS was performed and is negative except as noted in the HPI.   Past Medical History:  Diagnosis Date  . Depression   . Diabetes mellitus   . Enlarged prostate    Status post TURP; self caths @ home  . Hypertension   . Hypothyroidism   . Renal disorder      Family History  Problem Relation Age of Onset  . Hypertension Mother   . Cerebral aneurysm Mother   . Hypertension Brother   . Heart disease Father        Heart attack  . Cancer Neg Hx   . Diabetes Neg Hx        only among cousins     Social History:  reports that he has never smoked. He has never used smokeless tobacco. He reports that he does not drink alcohol or use drugs.   Exam: Current vital signs: BP 138/72   Pulse (!) 41   Temp 98.2 F (36.8 C) (Oral)   Resp 16   Ht 6\' 4"  (1.93 m)   Wt 104.3 kg (230 lb)   SpO2 100%   BMI 28.00 kg/m   Vital signs in last 24 hours: Temp:  [97.6 F (36.4 C)-98.2 F (36.8 C)] 98.2 F (36.8 C) (07/30 2320) Pulse Rate:  [26-90] 41 (07/30 2300) Resp:  [16-23] 16 (07/30 2223) BP: (138-169)/(72-112) 138/72 (07/30 2300) SpO2:  [95 %-100 %] 100 % (07/30 2300) Weight:  [104.3 kg (230 lb)] 104.3 kg (230 lb) (07/30 1416)   Physical Exam  Constitutional:  Appears Elderly Psych: Affect appropriate to situation Eyes: No scleral injection HENT: No OP obstrucion Head: Normocephalic.  Cardiovascular: Irregular Respiratory: Effort normal and breath sounds normal to anterior ascultation GI: Soft.  No distension. There is no tenderness.  Skin: The skin on his lower legs are indurated, with a fungating area on the right leg  Neuro: Mental Status: Patient is awake, alert, oriented to person, unable to give the month and one for the year he answers "80" rather than 18 He has slow and deliberate speech, only answers a few words at a time but is not clear if this is a aphasia versus volitional Cranial Nerves: II:  Dense right hemianopia. Pupils are equal, round, and reactive to light.   III,IV, VI: EOMI without ptosis or diploplia.  V: Facial sensation is symmetric to temperature VII: Facial movement is symmetric.  VIII: hearing is intact to voice X: Uvula elevates symmetrically XI: Shoulder shrug is symmetric. XII: tongue is midline without atrophy or fasciculations.  Motor: Tone is normal. Bulk is normal. He has 4/5 strength the right arm and leg Sensory: Sensation is diminished to light touch in the right arm, but he reports symmetric sensation in the legs  Deep Tendon Reflexes: 2+ and symmetric in the biceps and patellae.  Cerebellar: He does not accurately touch my finger, but does not have any clear ataxia when reaching with either upper extremity    I have reviewed labs in epic and the results pertinent to this consultation are: Creatinine 1.3, elevated glucose, hypokalemia  I have reviewed the images obtained: MRI brain-multifocal infarcts likely result of emboli.  Impression: 83 year old male with new onset atrial fibrillation and infarcts. Atrial fibrillation would certainly be an explanation. Given hemorrhagic conversion, would hold on antithrombotics for now  Recommendations: 1. HgbA1c, fasting lipid panel 2. Frequent neuro  checks 3. Echocardiogram 4. Carotid dopplers 5. Prophylactic therapy-Antiplatelet med: Aspirin - dose 325mg  PO or 300mg  PR 6. Risk factor modification 7. Telemetry monitoring 8. PT consult, OT consult, Speech consult 9. please page stroke NP  Or  PA  Or MD  from 8am -4 pm as this patient will be followed by the stroke team at this point.   You can look them up on [email or web address]      [name], MD [facility] [phone]  If 7pm- 7am, please page neurology on call as listed in AMION.

## 2017-07-06 NOTE — Progress Notes (Signed)
Pharmacy Antibiotic Note  Garrett Bradley is a 83 y.o. male admitted on 06/21/2017 with UTI.  Pharmacy has been consulted for Vancomycin and Ceftriaxone dosing. Note he has a urine culture from December 2017 with MRSA and empiric MRSA coverage is desired. SCr is elevated at 1.31, estimated normalized CrCl 40-45 ml/min. He received a Vancomycin 1500mg  loading dose at 2300 on 7/30  Plan: Change Vancomycin to 1250mg  IV q24h - next dose at 2200 Continue Ceftriaxone 1g IV q24h Follow available micro data Monitor renal function - check BMET with AM labs 8/1  Height: 6\' 4"  (193 cm) Weight: 230 lb (104.3 kg) IBW/kg (Calculated) : 86.8  Temp (24hrs), Avg:98 F (36.7 C), Min:97.2 F (36.2 C), Max:98.4 F (36.9 C)   Recent Labs Lab 06/23/2017 1605 06/30/2017 1917 07/06/17 0146 07/06/17 0428  WBC 10.2  --   --   --   CREATININE 1.31*  --   --   --   LATICACIDVEN 2.1* 2.2* 1.1 0.9    Estimated Creatinine Clearance: 58.7 mL/min (A) (by C-G formula based on SCr of 1.31 mg/dL (H)).    Allergies  Allergen Reactions  . Thiazide-Type Diuretics Other (See Comments)    03/2016 acute wrist pain, presumed gout Uric acid 10.4    Antimicrobials this admission: Ceftriaxone 7/30>> Fluconazole 7/31>>(8/2) Vanc 7/30>>  Dose adjustments this admission:   Microbiology results: 7/30 Urine>> 7/31 MRSA PCR positive  Thank you for allowing pharmacy to be a part of this patient's care.  Sallee Provencalurner, Jhamir Pickup S 07/06/2017 10:17 AM

## 2017-07-06 NOTE — Evaluation (Signed)
Speech Language Pathology Evaluation Patient Details Name: Garrett PanderJames C Bradley MRN: [id number] DOB: 02/02/1934 Today's Date: 07/06/2017 Time: [id number] SLP Time Calculation (min) (ACUTE ONLY): 34 min  Problem List:  Patient Active Problem List   Diagnosis Date Noted  . CVA (cerebral vascular accident) (HCC) 02/03/17  . Atrial fibrillation (HCC) 02/03/17  . Elevated troponin 02/03/17  . Hypokalemia 06/18/2016  . Scrotal swelling   . Acute kidney injury (HCC)   . DVT (deep venous thrombosis) (HCC) 04/13/2016  . Diastolic dysfunction 04/13/2016  . Edema 04/07/2016  . Wrist pain, right 03/24/2016  . Adult failure to thrive 03/24/2016  . Orthostatic hypotension 03/20/2016  . Protein-calorie malnutrition, severe 03/17/2016  . Dehydration 03/16/2016  . Hypernatremia 03/16/2016  . Diabetes mellitus type 2 with complications (HCC) 03/16/2016  . Depression 03/16/2016  . Thrombocytopenia, unspecified (HCC) 07/27/2014  . Lower urinary tract infectious disease 06/24/2014  . CKD (chronic kidney disease) stage 3, GFR 30-59 ml/min 08/06/2013  . Chronic indwelling Foley catheter 08/06/2013  . Thrush of mouth and esophagus (HCC) 10/02/2011  . Hypertension 10/02/2011  . Hypothyroidism 09/24/2011  . Anemia 09/24/2011  . BPH (benign prostatic hyperplasia) 09/24/2011   Past Medical History:  Past Medical History:  Diagnosis Date  . Depression   . Diabetes mellitus   . Enlarged prostate    Status post TURP; self caths @ home  . Hypertension   . Hypothyroidism   . Renal disorder    Past Surgical History:  Past Surgical History:  Procedure Laterality Date  . HERNIA REPAIR    . TRANSURETHRAL RESECTION OF PROSTATE  09/22/2011   Procedure: TRANSURETHRAL RESECTION OF THE PROSTATE (TURP);  Surgeon: Ky BarbanMohammad I Javaid;  Location: [facility];  Service: Urology;  Laterality: N/A;   HPI:  Garrett Bradley is a 83 y.o. male with a history of diabetes, hypertension who states that he has had some  trouble with his vision for a couple of days. Apparently was found by his family to be confused. He was found disheveled, and soiled. He was brought into the emergency department where a head CT was performed which showed a hypodensity in the left occipital lobe. An MRI was therefore performed which shows a large left PCA territory infarct as well as multifocal other likely embolic infarcts.   Assessment / Plan / Recommendation Clinical Impression  Pt demonstrates expressive aphasia with word finding impairment. Word finding with familiar objects relatively good, but worsening with more abstract language and tasks. When naming three tools, neologisms and perseveration present. Difficult to assess memory based on language function as pt cannot participate in basic conversation without prolonged hesitations when searching for words, however, suspect baseline memory impariment with exacerbation from recent CVA. Will follow to facilitate expression of wants and needs in acute setting and provide further education to pt/family. Recommend SNF at d/c.     SLP Assessment  SLP Recommendation/Assessment: Patient needs continued Speech Lanaguage Pathology Services SLP Visit Diagnosis: Aphasia (R47.01)    Follow Up Recommendations       Frequency and Duration min 2x/week  2 weeks      SLP Evaluation Cognition  Overall Cognitive Status: Impaired/Different from baseline Arousal/Alertness: Awake/alert Orientation Level: Oriented to person;Disoriented to time;Disoriented to situation;Disoriented to place Attention: Focused;Sustained Focused Attention: Appears intact Sustained Attention: Appears intact Memory: Impaired Memory Impairment: Retrieval deficit Awareness: Impaired Awareness Impairment: Emergent impairment Problem Solving: Impaired Problem Solving Impairment: Verbal basic;Functional complex       Comprehension  Auditory Comprehension Overall Auditory Comprehension: Appears  within  functional limits for tasks assessed Yes/No Questions: Within Functional Limits Commands: Within Functional Limits Conversation: Simple Reading Comprehension Reading Status: Not tested    Expression Verbal Expression Overall Verbal Expression: Impaired Initiation: No impairment Automatic Speech: Name;Social Response;Counting Level of Generative/Spontaneous Verbalization: Phrase Repetition: No impairment Naming: Impairment Responsive: 76-100% accurate Confrontation: Within functional limits Convergent: 0-24% accurate Divergent: 0-24% accurate Verbal Errors: Perseveration;Not aware of errors;Neologisms Pragmatics: No impairment Effective Techniques: Sentence completion;Phonemic cues   Oral / Motor  Oral Motor/Sensory Function Overall Oral Motor/Sensory Function: Within functional limits Motor Speech Overall Motor Speech: Appears within functional limits for tasks assessed   GO                   Harlon DittyBonnie Seena Ritacco, MA CCC-SLP [phone]  Claudine MoutonDeBlois, Donivan Thammavong Caroline 07/06/2017, 12:33 PM

## 2017-07-06 NOTE — Progress Notes (Signed)
CRITICAL VALUE ALERT  Critical Value:  Troponin 0.39  Date & Time Notied:  07/06/17 at 0314  Provider Notified: Donnamarie PoagK. Kirby, NP  Orders Received/Actions taken: no new orders given at this time. Will continue to monitor.
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
## 2017-07-06 NOTE — Progress Notes (Signed)
Verbal order received by Neurology MD at bedside for stat EKG

## 2017-07-06 NOTE — Consult Note (Signed)
WOC Nurse wound consult note Reason for Consult: cracked feet/legs Wound type: brawny skin changes with poor hygiene Patient unable to give much history, reports when I asked about his legs and swelling "yes they have been that way".  Skin changes consistent with venous stasis changes and possible lymphedema, but patient is not able to report any of this to be confirmed Pressure Injury POA: NA Measurement:one area lateral right leg that hardened with excessive skin growth, but not open and does not appear fluctuant, inner right calf has one small open area approx. 1cm x 1cm x 0.1cm, clean and pink, no significant drainage. Additional hardened, brawny areas over the left medial calf with small open area and small amount of drainage.   Wound bed: see above Drainage (amount, consistency, odor) minimal, but has odor that is typical for poor hygiene in the presence of this type of skin changes on the LEs Periwound: palpable pulses bilateral, not significantly edematous at this time.  Dressing procedure/placement/frequency: Silvadene cream to the open areas for antimicrobial coverage and treatment would not be as cost prohibitive. Cover with kerlix.  Patient really needs evaluation in a wound care center with lymphedema services as well long term. Aggressive skin debridement for the dry skin would be best accomplished in center that can offer whirlpool services.  Discussed POC with patient and bedside nurse.  Re consult if needed, will not follow at this time. Thanks  Fremon Zacharia M.D.C. [employer] MSN, RN,CWOCN, CNS, CWON-[facility] [phone])

## 2017-07-07 DIAGNOSIS — R791 Abnormal coagulation profile: Secondary | ICD-10-CM

## 2017-07-07 LAB — GLUCOSE, CAPILLARY
GLUCOSE-CAPILLARY: 37 mg/dL — AB (ref 65–99)
GLUCOSE-CAPILLARY: 71 mg/dL (ref 65–99)
GLUCOSE-CAPILLARY: 87 mg/dL (ref 65–99)
GLUCOSE-CAPILLARY: 81 mg/dL (ref 65–99)
Glucose-Capillary: 103 mg/dL — ABNORMAL HIGH (ref 65–99)
Glucose-Capillary: 99 mg/dL (ref 65–99)
Glucose-Capillary: 45 mg/dL — ABNORMAL LOW (ref 65–99)
Glucose-Capillary: 44 mg/dL — CL (ref 65–99)
Glucose-Capillary: 78 mg/dL (ref 65–99)
Glucose-Capillary: 75 mg/dL (ref 65–99)

## 2017-07-07 LAB — BASIC METABOLIC PANEL
ANION GAP: 5 (ref 5–15)
BUN: 20 mg/dL (ref 6–20)
CHLORIDE: 104 mmol/L (ref 101–111)
CO2: 29 mmol/L (ref 22–32)
Calcium: 7.7 mg/dL — ABNORMAL LOW (ref 8.9–10.3)
Creatinine, Ser: 1.21 mg/dL (ref 0.61–1.24)
GFR calc Af Amer: >60 mL/min (ref >60)
GFR calc non Af Amer: 54 mL/min — ABNORMAL LOW (ref >60)
GLUCOSE: 67 mg/dL (ref 65–99)
POTASSIUM: 3 mmol/L — AB (ref 3.5–5.1)
Sodium: 138 mmol/L (ref 135–145)

## 2017-07-07 LAB — VAS US CAROTID
LCCAPDIAS: 13 cm/s
LEFT ECA DIAS: -6 cm/s
LEFT VERTEBRAL DIAS: 4 cm/s
LICADDIAS: -20 cm/s
LICAPDIAS: -18 cm/s
LICAPSYS: -52 cm/s
Left CCA dist dias: -11 cm/s
Left CCA dist sys: -70 cm/s
Left CCA prox sys: 83 cm/s
Left ICA dist sys: -70 cm/s
RCCAPDIAS: 18 cm/s
RIGHT ECA DIAS: -13 cm/s
RIGHT VERTEBRAL DIAS: 14 cm/s
Right CCA prox sys: 215 cm/s
Right cca dist sys: -68 cm/s

## 2017-07-07 LAB — PROTIME-INR
INR: 1.54
Prothrombin Time: 18.6 seconds — ABNORMAL HIGH (ref 11.4–15.2)

## 2017-07-07 LAB — HEMOGLOBIN A1C
Hgb A1c MFr Bld: 8 % — ABNORMAL HIGH (ref 4.8–5.6)
Mean Plasma Glucose: 183 mg/dL

## 2017-07-07 LAB — CBC
HEMATOCRIT: 26.6 % — AB (ref 39.0–52.0)
HEMOGLOBIN: 8.5 g/dL — AB (ref 13.0–17.0)
MCH: 27.5 pg (ref 26.0–34.0)
MCHC: 32 g/dL (ref 30.0–36.0)
MCV: 86.1 fL (ref 78.0–100.0)
Platelets: 100 10*3/uL — ABNORMAL LOW (ref 150–400)
RBC: 3.09 MIL/uL — ABNORMAL LOW (ref 4.22–5.81)
RDW: 13.8 % (ref 11.5–15.5)
WBC: 8.3 10*3/uL (ref 4.0–10.5)

## 2017-07-07 MED ORDER — INSULIN ASPART 100 UNIT/ML ~~LOC~~ SOLN
0.0000 [IU] | Freq: Three times a day (TID) | SUBCUTANEOUS | Status: DC
  Administered 2017-07-08: 1 [IU] via SUBCUTANEOUS
  Administered 2017-07-09 – 2017-07-10 (×5): 2 [IU] via SUBCUTANEOUS
  Administered 2017-07-10: 3 [IU] via SUBCUTANEOUS
  Administered 2017-07-11 – 2017-07-13 (×8): 2 [IU] via SUBCUTANEOUS

## 2017-07-07 MED ORDER — DEXTROSE 50 % IV SOLN
INTRAVENOUS | Status: AC
  Administered 2017-07-07: 50 mL
  Filled 2017-07-07: qty 50

## 2017-07-07 MED ORDER — INSULIN DETEMIR 100 UNIT/ML ~~LOC~~ SOLN: 8.0000 [IU] | Freq: Every day | SUBCUTANEOUS | Status: DC

## 2017-07-07 MED ORDER — DEXTROSE-NACL 5-0.45 % IV SOLN
INTRAVENOUS | Status: DC
  Administered 2017-07-07: 50 mL/h via INTRAVENOUS
  Administered 2017-07-10 – 2017-07-13 (×6): via INTRAVENOUS

## 2017-07-07 MED ORDER — DEXTROSE 50 % IV SOLN
INTRAVENOUS | Status: AC
Start: 2017-07-07 — End: 2017-07-07
  Administered 2017-07-07: 50 mL
  Filled 2017-07-07: qty 50

## 2017-07-07 NOTE — Progress Notes (Signed)
STROKE TEAM PROGRESS NOTE   SUBJECTIVE (INTERVAL HISTORY) No family at bedside. Pt lying in bed, lethargic but arousable. Tele showed Afib but no RVR. He still not orientated but moving all extremities.     OBJECTIVE Temp:  [97.6 F (36.4 C)-98.7 F (37.1 C)] 97.6 F (36.4 C) (08/01 0819) Pulse Rate:  [25-79] 68 (08/01 0819) Cardiac Rhythm: Atrial fibrillation;Bundle branch block (08/01 1137) Resp:  [18-25] 18 (08/01 0819) BP: (113-165)/(43-101) 132/63 (08/01 0819) SpO2:  [69 %-98 %] 97 % (08/01 0819)  CBC:   Recent Labs Lab 07/03/2017 1605 07/07/17 0335  WBC 10.2 8.3  NEUTROABS 8.0*  --   HGB 10.3* 8.5*  HCT 31.9* 26.6*  MCV 88.4 86.1  PLT 134* 100*    Basic Metabolic Panel:   Recent Labs Lab 06/23/2017 1605 07/07/17 0335  NA 140 138  K 3.2* 3.0*  CL 99* 104  CO2 31 29  GLUCOSE 198* 67  BUN 31* 20  CREATININE 1.31* 1.21  CALCIUM 8.5* 7.7*    Lipid Panel:     Component Value Date/Time   CHOL 101 07/06/2017 0428   TRIG 69 07/06/2017 0428   HDL 25 (L) 07/06/2017 0428   CHOLHDL 4.0 07/06/2017 0428   VLDL 14 07/06/2017 0428   LDLCALC 62 07/06/2017 0428   HgbA1c:  Lab Results  Component Value Date   HGBA1C 8.0 (H) 07/06/2017   Urine Drug Screen:     Component Value Date/Time   LABOPIA NONE DETECTED 07/06/2017 1811   COCAINSCRNUR NONE DETECTED 07/06/2017 1811   LABBENZ NONE DETECTED 07/06/2017 1811   AMPHETMU NONE DETECTED 07/06/2017 1811   THCU NONE DETECTED 07/06/2017 1811   LABBARB NONE DETECTED 07/06/2017 1811    Alcohol Level     Component Value Date/Time   ETH <5 03/16/2016 1358    IMAGING I have personally reviewed the radiological images below and agree with the radiology interpretations.  Dg Chest 1 View 06/17/2017 IMPRESSION: The findings suggest low-grade CHF.  There is no alveolar pneumonia.    Ct Head Wo Contrast 06/30/2017 IMPRESSION: Recent appearing infarct in the left medial occipital lobe. Suspect early acute infarct in the  left parietal lobe with questionable early hyperdense vessel change in the left middle cerebral artery. Given these findings, correlation with MR pre and post-contrast advised to further evaluate. No mass or hemorrhage evident. No midline shift. Foci of calcification noted in each carotid siphon.  Mr Brain Wo Contrast (neuro Protocol) Mr Shirlee Latch (cerebral Arteries) 06/10/2017 IMPRESSION: 1. Left PCA territory acute infarct with associated diffuse petechial hemorrhage throughout the infarct site, without mass effect. 2. Occlusion of left posterior cerebral artery at the mid P2 segment. 3. Scattered subcentimeter foci of acute ischemia within both cerebellar hemispheres, left thalamus, left anterior temporal lobe, left parietal lobe and both frontal lobes. The distribution suggests a central cardioembolic process.   Cartoid [place] 07/06/2017 B ICA 1-39% stenosis, VAs antegrade  2D Echo 07/06/2017 - Left ventricle: The cavity size was normal. There was moderate   concentric hypertrophy. Systolic function was moderately reduced.   The estimated ejection fraction was in the range of 35% to 40%.   Moderate diffuse hypokinesis with no identifiable regional   variations. - Ventricular septum: Septal motion showed abnormal function,   dyssynergy, and paradox. These changes are consistent with   intraventricular conduction delay. - Aortic valve: There was trivial regurgitation. - Mitral valve: There was mild regurgitation. - Left atrium: The atrium was mildly dilated. - Right atrium: The  atrium was mildly dilated. - Pericardium, extracardiac: A trivial pericardial effusion was   identified. There was a left pleural effusion.   PHYSICAL EXAM  Temp:  [97.6 F (36.4 C)-98.7 F (37.1 C)] 97.6 F (36.4 C) (08/01 0819) Pulse Rate:  [25-79] 68 (08/01 0819) Resp:  [18-25] 18 (08/01 0819) BP: (113-165)/(43-101) 132/63 (08/01 0819) SpO2:  [69 %-98 %] 97 % (08/01 0819)  General - Well nourished, well  developed, lethargic but arousable.  Ophthalmologic - Fundi not visualized due to noncooperation.  Cardiovascular - irregularly irregular heart rate and rhythm.  Neuro - lethargic, but eyes open and following some commands, dysarthric, not orientated to place, time or age, but know his own name. Not able to name but able to repeat. Paucity of speech. Right upper quadrantanopia. No significant facial droop and tongue midline. Moving all extremities symmetrically, at least 3+/5. DTR 1+ and no babinski. Sensation symmetrical, coordination not cooperative and gait not tested.    ASSESSMENT/PLAN [name] is a 83 y.o. male with history of diabetes, hypertension, hypothyroidism, and history of acute kidney injury who presented with approximately two days of vision problem for a couple of days. He did not receive IV t-PA due to unknown time LKW.   Stroke: multifocal infarcts bilateral anterior and posterior infarcts, cardioembolic pattern likely due to newly recognized afib.   Resultant  Right upper quadrantanopia, lethargy  CT head: Recent L medial occipital lobe infarct, acute L parietal infarct with questionable hyperdense L MCA.  MRI head:  L PCA infarcts with diffuse petechial hemorrhage, and scattered bilateral subcentimeter acute infarcts in L thalamus, L anterior temporal lobe, and L parietal lobe and both frontal lobes  MRA head:  L P2 occlusion  Carotid Doppler: B ICA 1-39% stenosis, VAs antegrade  2D Echo  EF 35-40%  LDL 62   HgbA1c 8.8  SCDs for VTE prophylaxis Diet Carb Modified Fluid consistency: Thin; Room service appropriate? Yes  No antithrombotic prior to admission, now on ASA 325mg . Due to afib, we recommend anticoagulation for stroke prevention in 7 days due to moderate sized infarct and petechial hemorrhage. Once anticoagulatoin started, ASA can be discontinued.  Ongoing aggressive stroke risk factor management  Therapy recommendations:   pending  Disposition:  Pending  afib newly recognized  EKG and tele showed afib  Cardiology on board and help appreciated  Initiate on ASA, will recommend anticoagulation in 7 days due to moderate sized infarct and petechial hemorrhage. Once anticoagulation started, ASA can be stopped from stroke standpoint.  MRSA UTI  UA showed WBC TNTC  Urine culture positive for MRSA  On rocephin and vanco  Hx of DVT on Xarelto  Hx of DVT in 03/2016  Was on Xarelto, but reported not on Xarelto PTA  INR was 1.49 -> 1.54 - etiology unclear, ?? Is pt taking Xarelto at home?? vs. Chronic elevation ??  Hypertension  Stable  Permissive hypertension (OK if < 220/120) but gradually normalize in 5-7 days  Long-term BP goal normotensive  Hyperlipidemia  Home meds:  none  LDL 62, goal < 70  Add atorvastatin 20mg  PO daily  Continue statin at discharge  Diabetes  HgbA1c 8.8, goal < 7.0  Uncontrolled  Hyperglycemia  On levemir  Likely not taking meds at home  Other Stroke Risk Factors  Advanced age  Other Active Problems  Elevated troponin: 0.3 ->0.39 ->0.44  Elevated Cre 1.31  Noncompliance   Neglect at home??  Hospital day # 2  Neurology will sign off.  Please call with questions. [facility]yn [name]. Thanks for the consult.   Marvel PlanJindong Carlton Sweaney, MD PhD Stroke Neurology 07/07/2017 3:15 PM    To contact Stroke Continuity provider, please refer to [email or web address]. After hours, contact General Neurology

## 2017-07-07 NOTE — Evaluation (Signed)
Occupational Therapy Evaluation Patient Details Name: Garrett Bradley MRN: [id number] DOB: 01/05/1934 Today's Date: 07/07/2017    History of Present Illness Pt adm from home with confusion. MRI showed large left PCA territory infarct as well as multifocal other likely embolic infarcts. When EMS called to the home pt found to have hospital armband on from 12/17. PMH - hypothyroidism; HTN; DM   Clinical Impression   Pt presents with lethargy with impaired cognition, poor sitting balance and dependence in all ADL. Pt with minimal verbalizations and oriented only to self. Unable to assess vision due to pt with eyes closed most of session, but he denies diplopia. Pt requires total assist for ADL Will need post acute rehab in SNF. Will follow.    Follow Up Recommendations  SNF;Supervision/Assistance - 24 hour    Equipment Recommendations       Recommendations for Other Services       Precautions / Restrictions Precautions Precautions: Fall Restrictions Weight Bearing Restrictions: No      Mobility Bed Mobility Overal bed mobility: Needs Assistance Bed Mobility: Supine to Sit;Sit to Supine     Supine to sit: Total assist Sit to supine: Total assist   General bed mobility comments: pt without initiation of movement, assisted for all aspects  Transfers                 General transfer comment: did not attempt due to lethargy    Balance Overall balance assessment: Needs assistance Sitting-balance support: Bilateral upper extremity supported;Feet supported Sitting balance-Leahy Scale: Poor Sitting balance - Comments: heavy lean R and posterior                                   ADL either performed or assessed with clinical judgement   ADL                                         General ADL Comments: Dependent in all ADL.     Vision   Additional Comments: pt with eyes closed for most of session, unable to assess formally, pt denied  diplopia     Perception     Praxis      Pertinent Vitals/Pain Faces Pain Scale: Hurts little more Pain Location: generalized Pain Descriptors / Indicators: Grimacing;Guarding Pain Intervention(s): Monitored during session     Hand Dominance Right   Extremity/Trunk Assessment Upper Extremity Assessment Upper Extremity Assessment: Difficult to assess due to impaired cognition (resistant to shoulder ROM, gross grip, 4/5 on L, 3/5 on R)   Lower Extremity Assessment Lower Extremity Assessment: Defer to PT evaluation       Communication Communication Communication: Expressive difficulties   Cognition Arousal/Alertness: Lethargic Behavior During Therapy: Flat affect Overall Cognitive Status: Difficult to assess                                 General Comments: pt answering uh-huh to most questions, oriented to self only, inconsistently following commands   General Comments       Exercises     Shoulder Instructions      Home Living Family/patient expects to be discharged to:: Skilled nursing facility Living Arrangements: Alone   Type of Home: House Home Access: Ramped entrance     Home  Layout: Two level;Laundry or work area in basement;Able to live on main level with bedroom/bathroom     Bathroom Shower/Tub: Chief Strategy OfficerTub/shower unit   Bathroom Toilet: Standard     Home Equipment: Environmental consultantWalker - 2 wheels;Cane - single point;Crutches   Additional Comments: Information from prior encounter with PT      Prior Functioning/Environment Level of Independence: Needs assistance        Comments: pt unable to report PLOF other than he lived alone        OT Problem List: Decreased strength;Decreased activity tolerance;Impaired balance (sitting and/or standing);Decreased coordination;Decreased cognition;Decreased knowledge of use of DME or AE;Decreased safety awareness;Impaired UE functional use;Pain      OT Treatment/Interventions: Self-care/ADL  training;Neuromuscular education;DME and/or AE instruction;Therapeutic activities;Cognitive remediation/compensation;Patient/family education;Balance training    OT Goals(Current goals can be found in the care plan section) Acute Rehab OT Goals Patient Stated Goal: pt unable to state OT Goal Formulation: Patient unable to participate in goal setting Time For Goal Achievement: 07/21/17 Potential to Achieve Goals: Fair ADL Goals Pt Will Perform Eating: with min assist;sitting (when cleared to begin POs) Pt Will Perform Grooming: with mod assist;sitting Pt Will Transfer to Toilet: with mod assist;stand pivot transfer;bedside commode Pt/caregiver will Perform Home Exercise Program: Increased strength;Both right and left upper extremity;With minimal assist (A/AAROM) Additional ADL Goal #1: Pt will perform bed mobility with moderate assistance. Additional ADL Goal #2: Pt will follow one step commands with 50% of time.  OT Frequency: Min 3X/week   Barriers to D/C: Decreased caregiver support          Co-evaluation              AM-PAC PT "6 Clicks" Daily Activity     Outcome Measure Help from another person eating meals?: Total Help from another person taking care of personal grooming?: Total Help from another person toileting, which includes using toliet, bedpan, or urinal?: Total Help from another person bathing (including washing, rinsing, drying)?: Total Help from another person to put on and taking off regular upper body clothing?: Total Help from another person to put on and taking off regular lower body clothing?: Total 6 Click Score: 6   End of Session    Activity Tolerance: Patient limited by lethargy Patient left: in bed;with call bell/phone within reach;with bed alarm set  OT Visit Diagnosis: Muscle weakness (generalized) (M62.81);Pain;Other symptoms and signs involving cognitive function;Cognitive communication deficit (R41.841) Symptoms and signs involving cognitive  functions: Cerebral infarction                Time: 1526-1540 OT Time Calculation (min): 14 min Charges:  OT General Charges $OT Visit: 1 Procedure OT Evaluation $OT Eval Moderate Complexity: 1 Procedure G-Codes:     Evern BioMayberry, Ericberto Padget Lynn 07/07/2017, 3:53 PM  [phone]

## 2017-07-07 NOTE — [place] FL2 (Signed)
[place] MEDICAID FL2 LEVEL OF CARE SCREENING TOOL     IDENTIFICATION  Patient Name: Garrett Bradley Birthdate: 01/06/1934 Sex: male Admission Date (Current Location): Nov 07, 2017  [facility] [place]  [place]   Facility and Address:  [facility], [address], [place], [place] [address]      Provider Number: [id number]  Attending Physician Name and Address:  Cathren Harshai, Ripudeep K, MD  Relative Name and Phone Number:       Current Level of Care: Hospital Recommended Level of Care: Skilled Nursing Facility Prior Approval Number:    Date Approved/Denied:   PASRR Number: [id number]  Discharge Plan: SNF    Current Diagnoses: Patient Active Problem List   Diagnosis Date Noted  . Failure to thrive (0-17)   . CVA (cerebral vascular accident) (HCC) 0Dec 02, 2018  . New onset atrial fibrillation (HCC) 0Dec 02, 2018  . Elevated troponin 0Dec 02, 2018  . Hypokalemia 06/18/2016  . Scrotal swelling   . Acute kidney injury (HCC)   . DVT (deep venous thrombosis) (HCC) 04/13/2016  . Diastolic dysfunction 04/13/2016  . Edema 04/07/2016  . Wrist pain, right 03/24/2016  . Adult failure to thrive 03/24/2016  . Orthostatic hypotension 03/20/2016  . Protein-calorie malnutrition, severe 03/17/2016  . Dehydration 03/16/2016  . Hypernatremia 03/16/2016  . Diabetes mellitus type 2 with complications (HCC) 03/16/2016  . Depression 03/16/2016  . Thrombocytopenia, unspecified (HCC) 07/27/2014  . Lower urinary tract infectious disease 06/24/2014  . CKD (chronic kidney disease) stage 3, GFR 30-59 ml/min 08/06/2013  . Chronic indwelling Foley catheter 08/06/2013  . Thrush of mouth and esophagus (HCC) 10/02/2011  . Hypertension 10/02/2011  . Hypothyroidism 09/24/2011  . Anemia 09/24/2011  . BPH (benign prostatic hyperplasia) 09/24/2011    Orientation RESPIRATION BLADDER Height & Weight     Self  Normal Incontinent, Indwelling catheter Weight: 230 lb (104.3  kg) Height:  6\' 4"  (193 cm)  BEHAVIORAL SYMPTOMS/MOOD NEUROLOGICAL BOWEL NUTRITION STATUS      Incontinent Diet (carb modified)  AMBULATORY STATUS COMMUNICATION OF NEEDS Skin   Extensive Assist Verbally Other (Comment) (cracked feet/legs- cover with kerlix)                       Personal Care Assistance Level of Assistance  Bathing, Dressing, Feeding Bathing Assistance: Maximum assistance Feeding assistance: Limited assistance Dressing Assistance: Maximum assistance     Functional Limitations Info             SPECIAL CARE FACTORS FREQUENCY  PT (By licensed PT), OT (By licensed OT)     PT Frequency: 5/wk OT Frequency: 5/wk            Contractures      Additional Factors Info  Code Status, Allergies, Psychotropic, Insulin Sliding Scale, Isolation Precautions Code Status Info: FULL Allergies Info: thiazide-type diuretics Psychotropic Info: celexa Insulin Sliding Scale Info: 5/day Isolation Precautions Info: MRSA     Current Medications (07/07/2017):  This is the current hospital active medication list Current Facility-Administered Medications  Medication Dose Route Frequency Provider Last Rate Last Dose  . 0.9 %  sodium chloride infusion   Intravenous Continuous Jonah BlueYates, Jennifer, MD 50 mL/hr at 07/07/17 0254    . acetaminophen (TYLENOL) tablet 650 mg  650 mg Oral Q4H PRN Jonah BlueYates, Jennifer, MD       Or  . acetaminophen (TYLENOL) solution 650 mg  650 mg Per Tube Q4H PRN Jonah BlueYates, Jennifer, MD       Or  . acetaminophen (  TYLENOL) suppository 650 mg  650 mg Rectal Q4H PRN Jonah BlueYates, Jennifer, MD      . ALPRAZolam Prudy Feeler(XANAX) tablet 1 mg  1 mg Oral QHS PRN Jonah BlueYates, Jennifer, MD      . aspirin EC tablet 325 mg  325 mg Oral Daily Marvel PlanXu, Jindong, MD      . atorvastatin (LIPITOR) tablet 20 mg  20 mg Oral q1800 Marvel PlanXu, Jindong, MD      . cefTRIAXone (ROCEPHIN) 1 g in dextrose 5 % 50 mL IVPB  1 g Intravenous Q24H Jonah BlueYates, Jennifer, MD   Stopped at 07/06/17 2130  . Chlorhexidine Gluconate Cloth 2 %  PADS 6 each  6 each Topical Q0600 Edsel PetrinMikhail, Maryann, DO      . citalopram (CELEXA) tablet 20 mg  20 mg Oral Daily Jonah BlueYates, Jennifer, MD   20 mg at 07/06/17 1134  . feeding supplement (ENSURE ENLIVE) (ENSURE ENLIVE) liquid 237 mL  237 mL Oral BID BM Mikhail, Nita SellsMaryann, DO      . fluconazole (DIFLUCAN) tablet 100 mg  100 mg Oral Daily Jonah BlueYates, Jennifer, MD   100 mg at 07/06/17 1134  . insulin aspart (novoLOG) injection 0-15 Units  0-15 Units Subcutaneous TID WC Jonah BlueYates, Jennifer, MD      . insulin aspart (novoLOG) injection 0-5 Units  0-5 Units Subcutaneous QHS Jonah BlueYates, Jennifer, MD      . insulin detemir (LEVEMIR) injection 20 Units  20 Units Subcutaneous QHS Jonah BlueYates, Jennifer, MD   20 Units at 07/06/17 2107  . latanoprost (XALATAN) 0.005 % ophthalmic solution 1 drop  1 drop Both Eyes QHS Jonah BlueYates, Jennifer, MD   1 drop at 07/06/17 2108  . levothyroxine (SYNTHROID, LEVOTHROID) tablet 137 mcg  137 mcg Oral QAC breakfast Jonah BlueYates, Jennifer, MD      . MEDLINE mouth rinse  15 mL Mouth Rinse BID Jonah BlueYates, Jennifer, MD   15 mL at 07/06/17 2150  . mupirocin ointment (BACTROBAN) 2 % 1 application  1 application Nasal BID Edsel PetrinMikhail, Maryann, DO   1 application at 07/06/17 2150  . potassium chloride (K-DUR,KLOR-CON) CR tablet 20 mEq  20 mEq Oral BID Jonah BlueYates, Jennifer, MD   20 mEq at 07/06/17 2105  . senna (SENOKOT) tablet 8.6 mg  1 tablet Oral Daily Jonah BlueYates, Jennifer, MD   8.6 mg at 07/06/17 1135  . senna-docusate (Senokot-S) tablet 1 tablet  1 tablet Oral QHS PRN Jonah BlueYates, Jennifer, MD      . silver sulfADIAZINE (SILVADENE) 1 % cream   Topical Daily Edsel PetrinMikhail, Maryann, DO      . tamsulosin [facility](FLOMAX) capsule 0.4 mg  0.4 mg Oral QPC supper Jonah BlueYates, Jennifer, MD   0.4 mg at 07/06/17 2059  . vancomycin (VANCOCIN) 1,250 mg in sodium chloride 0.9 % 250 mL IVPB  1,250 mg Intravenous Q24H Sallee Provencalurner, Michelle S, [facility] System[facility]PH   Stopped at 07/06/17 2320     Discharge Medications: Please see discharge summary for a list of discharge medications.  Relevant  Imaging Results:  Relevant Lab Results:   Additional Information SS#: [id number]  Burna SisUris, Deral Schellenberg H, LCSW

## 2017-07-07 NOTE — Progress Notes (Signed)
Inpatient Diabetes Program Recommendations  AACE/ADA: New Consensus Statement on Inpatient Glycemic Control (2015)  Target Ranges:  Prepandial:   less than 140 mg/dL      Peak postprandial:   less than 180 mg/dL (1-2 hours)      Critically ill patients:  140 - 180 mg/dL   Results for Garrett Bradley, [name] (MRN [id number]) as of 07/07/2017 13:23  Ref. Range 07/06/2017 07:52 07/06/2017 12:32 07/06/2017 16:43 07/06/2017 22:00 07/07/2017 08:41 07/07/2017 09:01 07/07/2017 09:32 07/07/2017 10:24  Glucose-Capillary Latest Ref Range: 65 - 99 mg/dL 92 80 99 [id number] (H) 37 (LL) 103 (H) 71 99   Review of Glycemic Control  Diabetes history: DM 2 Outpatient Diabetes medications: Levemir 20 units QHS Current orders for Inpatient glycemic control: Levemir 20 units QHS, Novolog Moderate 0-15 units tid, Novolog HS scale 0-5 units  Inpatient Diabetes Program Recommendations:    Patient with Hypoglycemia 37 mg/dl this am.  Please d/c current Levemir dose and Monitor glucose levels with Novolog Correction.   If glucose reaches above inpatient goal of >180 mg/dl consider adding a portion of patient's Levemir.  A1c 8% on 07/06/17  Thanks,  Christena DeemShannon Kimberli Winne RN, MSN, [facility] Inpatient Diabetes Coordinator Team Pager [phone] (8a-5p)

## 2017-07-07 NOTE — Progress Notes (Signed)
Patient has had intermittent episodes of hypoglycemia, ranging from 37-45.  Followed hypoglycemia protocol, and corrected with D50 and orange juice ( ). Paged MD to discuss changing IV fluid. Patient is too lethargic for food intake, with exception of orange juice.

## 2017-07-07 NOTE — Progress Notes (Signed)
  Speech Language Pathology Treatment: Cognitive-Linquistic  Patient Details Name: Garrett Bradley MRN: [id number] DOB: 12/20/1933 Today's Date: 07/07/2017 Time: [id number] SLP Time Calculation (min) (ACUTE ONLY): 24 min  Assessment / Plan / Recommendation Clinical Impression  Per nursing, pt has been lethargic and hypoglycemic all day. Pt opens eyes when name called and shuts again. Intent of session was facilitation of language. He appeared to be masticating something therefore oral care provided. SLP proceeded to use numerous toothettes, suction, mouth rinse to remove copious and diffuse scrambled egg residue pt held from breakfast and pill. . Attempted further language tasks however pt continued with lethargy. Notified nursing of session. [place]. Recommend no po's until he is alert and aware. RN reported pt self feeding without swallow difficulty yesterday. Educated RN to request ST order for swallow if oral pocketing persists tomorrow.    HPI HPI: Garrett PanderJames C Mazon is a 83 y.o. male with a history of diabetes, hypertension who states that he has had some trouble with his vision for a couple of days. Apparently was found by his family to be confused. He was found disheveled, and soiled. He was brought into the emergency department where a head CT was performed which showed a hypodensity in the left occipital lobe. An MRI was therefore performed which shows a large left PCA territory infarct as well as multifocal other likely embolic infarcts.      SLP Plan  Continue with current plan of care       Recommendations                   Oral Care Recommendations: Oral care QID Follow up Recommendations:  (TBD) SLP Visit Diagnosis: Aphasia (R47.01) Plan: Continue with current plan of care       GO                Royce MacadamiaLitaker, Jareth Pardee Willis 07/07/2017, 2:28 PM  Breck CoonsLisa Willis Lonell FaceLitaker M.Ed [employer]-SLP Pager [phone]

## 2017-07-07 NOTE — Progress Notes (Signed)
[place]. RN informed her of patients family members that have visited or called, and his overall condition, CVA, AFIB, Lymphedema in bilateral lower extremities. Rep [place].

## 2017-07-07 NOTE — Progress Notes (Signed)
Patient CBG was 37. Administered 1/5 amp of dextrose 50%. CBG recheck was 103

## 2017-07-07 NOTE — Progress Notes (Signed)
[facility]                                                                              Patient Demographics  Garrett Bradley, is a 83 y.o. male, DOB - 04/01/1934, ZOX:[id number]  Admit date - 06/23/2017   Admitting Physician [name], MD  Outpatient Primary MD for the patient is Assunta Bradley, [name], MD  Outpatient specialists:   LOS - 2  days   Medical records reviewed and are as summarized below:    Chief Complaint  Patient presents with  . Failure To Thrive       Brief summary   Patient is a 83 year old male with hypothyroidism, hypertension, BPH, diabetes presented due to confusion, acute encephalopathy. Family had called EMS because patient did not answer the door. Per EMS patient was sitting in the chair, soiled in urine and stool. Patient had Hospital on the end on patient's arm dated December 2017 and med bottles were dated the same date and were empty. Patient reported that he had not been changed in 2 weeks. CT head on admission showed recent appearing infarct in the left medial occipital lobe, early acute infarct in the left parietal lobe. Patient was admitted for further workup.  Assessment & Plan    Principal Problem:   Acute CVA (cerebral vascular accident) (HCC) - CT head showed recent appearing infarct in the left medial occipital lobe, early acute infarct in the left parietal lobe,  early hypodense vessel change in the left middle cerebral artery - MRI showed a left PCA territory acute infarct with associated diffuse petechial hemorrhage throughout the infarct site. Occlusion of the left posterior cerebral artery at the mid P2 segment. Scattered subcentimeter foci of acute ischemia within both cerebral hemispheres, left thalamus and left anterior temporal lobe, parietal lobe and both frontal lobes suggesting central cardioembolic process - 2-D echo showed EF 35-40%, moderate diffuse hypokinesis, intraventricular conduction delay  - Carotid Doppler  showed no significant ICA stenosis - Neurology was consulted, recommended aspirin and avoiding and evaluation due to petechial hemorrhage - LDL 62, continue aspirin, statin  - Cardiology was consulted, patient seen by Dr Elease HashimotoNahser, per recommendations ECG showed atrial fibrillation, occasional normal sinus rhythm, had stroke and will eventually need to be started on oral anticoagulation. Evidence of hemorrhagic transformation of the stroke so we'll defer to neurology as to timing of initiation of AC.  Active Problems:   Mildly elevated troponins - Likely secondary to above, cardiology consulted, per cardiology likely due to demand ischemia - 2-D echo showed EF of 35-40% with moderate diffuse hypokinesis - No invasive workup recommended by cardiology at this time  Acute Systolic CHF - 2-D echo showed EF of 35-40%, cardiology consulted - Per cardiology poor candidate for invasive cardiac procedures, will treat with standard medical management for CHF    Failure to thrive (adult) - Patient reportedly found in feces and urine with Hospital bracelet from December 2017, his medications were not filled since December -  will need skilled nursing facility on discharge  UTI - Continue vancomycin, ceftriaxone, patient also had indwelling Foley catheter on admission which had not been changed -  Urine culture showing more than 100,000 colonies of staph aureus   Essential hypertension - Allow for permissive hypertension given the acute stroke - will continue Cardizem for hypertension at discharge   Diabetes mellitus type 2 - Continue Levemir, sliding scale insulin, CBGs controlled -Hemoglobin A1c 8.0   Hypothyroidism - TSH 6.2, continue Synthroid. Unclear if patient was taking his medications, does not appear to have filled after December 2017 - Synthroid restarted, repeat thyroid testing in 4-6 weeks   Moderate malnutrition - Nutrition consulted, continue supplementation   Thrush -  continue Diflucan  Chronic normocytic anemia - Hemoglobin appears to be at baseline  Code Status: * full   DVT Prophylaxis:   SCD's Family Communication: No family member at the bedside   Disposition Plan:  Will need SNF  Time Spent in minutes   25 minutes  Procedures:  MRI/MRA brain   2-D echo  Consultants:   Neurology   Antimicrobials:      Medications  Scheduled Meds: . aspirin EC  325 mg Oral Daily  . atorvastatin  20 mg Oral q1800  . Chlorhexidine Gluconate Cloth  6 each Topical Q0600  . citalopram  20 mg Oral Daily  . feeding supplement (ENSURE ENLIVE)  237 mL Oral BID BM  . fluconazole  100 mg Oral Daily  . insulin aspart  0-15 Units Subcutaneous TID WC  . insulin aspart  0-5 Units Subcutaneous QHS  . insulin detemir  20 Units Subcutaneous QHS  . latanoprost  1 drop Both Eyes QHS  . levothyroxine  137 mcg Oral QAC breakfast  . mouth rinse  15 mL Mouth Rinse BID  . mupirocin ointment  1 application Nasal BID  . potassium chloride  20 mEq Oral BID  . senna  1 tablet Oral Daily  . silver sulfADIAZINE   Topical Daily  . tamsulosin  0.4 mg Oral QPC supper   Continuous Infusions: . sodium chloride 50 mL/hr at 07/07/17 0254  . cefTRIAXone (ROCEPHIN)  IV Stopped (07/06/17 2130)  . vancomycin Stopped (07/06/17 2320)   PRN Meds:.acetaminophen **OR** acetaminophen (TYLENOL) oral liquid 160 mg/5 mL **OR** acetaminophen, ALPRAZolam, senna-docusate   Antibiotics   Anti-infectives    Start     Dose/Rate Route Frequency Ordered Stop   07/06/17 2200  vancomycin (VANCOCIN) 1,250 mg in sodium chloride 0.9 % 250 mL IVPB     1,250 mg 166.7 mL/hr over 90 Minutes Intravenous Every 24 hours 07/06/17 1020     07/06/17 1000  vancomycin (VANCOCIN) IVPB 1000 mg/200 mL premix  Status:  Discontinued     1,000 mg 200 mL/hr over 60 Minutes Intravenous Every 12 hours 03-27-17 2138 07/06/17 1017   07/06/17 1000  fluconazole (DIFLUCAN) tablet 100 mg     100 mg Oral Daily 03-27-17  2319 07/09/17 0959   03-27-17 2200  vancomycin (VANCOCIN) 1,500 mg in sodium chloride 0.9 % 500 mL IVPB  Status:  Discontinued     1,500 mg 250 mL/hr over 120 Minutes Intravenous  Once 03-27-17 2135 07/06/17 1017   03-27-17 2145  cefTRIAXone (ROCEPHIN) 1 g in dextrose 5 % 50 mL IVPB     1 g 100 mL/hr over 30 Minutes Intravenous Every 24 hours 03-27-17 2144          Subjective:   Garrett CourtJames Gemmill was seen and examined today. Confused, unable to provide any review of systems, has dementia. No fevers or chills. No acute events overnight.   Objective:   Vitals:   07/06/17 2302 07/07/17  0322 07/07/17 0326 07/07/17 0819  BP:   (!) 113/43 132/63  Pulse: 79  (!) 38 68  Resp: (!) 23  20 18   Temp:   98.7 F (37.1 C) 97.6 F (36.4 C)  TempSrc:  Oral Oral Oral  SpO2: 98%  98% 97%  Weight:      Height:        Intake/Output Summary (Last 24 hours) at 07/07/17 1213 Last data filed at 07/07/17 0819  Gross per 24 hour  Intake          1615.83 ml  Output              700 ml  Net           915.83 ml     Wt Readings from Last 3 Encounters:  06/18/2017 104.3 kg (230 lb)  11/26/16 106.1 kg (234 lb)  11/07/16 106.1 kg (234 lb)     Exam  General:  Confused , alert and oriented to himself  Eyes: PERRLA, EOMI, Anicteric Sclera,  HEENT:  Atraumatic, normocephalic, normal oropharynx  Cardiovascular: S1 S2 auscultated, no rubs, murmurs or gallops.   Respiratory: Clear to auscultation bilaterally, no wheezing, rales or rhonchi  Gastrointestinal: Soft, nontender, nondistended, + bowel sounds  Ext: trace pedal edema bilaterally  Neuro: not following commands   Musculoskeletal: No digital cyanosis, clubbing  Skin: chronic venous stasis changes on the lower extremities   Psych: oriented to self, confused   Data Reviewed:  I have personally reviewed following labs and imaging studies  Micro Results Recent Results (from the past 240 hour(s))  Urine culture     Status: Abnormal  (Preliminary result)   Collection Time: 06/09/2017  2:39 PM  Result Value Ref Range Status   Specimen Description URINE, RANDOM  Final   Special Requests NONE  Final   Culture >=100,000 COLONIES/mL STAPHYLOCOCCUS AUREUS (A)  Final   Report Status PENDING  Incomplete  MRSA PCR Screening     Status: Abnormal   Collection Time: 07/06/17  1:00 AM  Result Value Ref Range Status   MRSA by PCR POSITIVE (A) NEGATIVE Final    Comment:        The GeneXpert MRSA Assay (FDA approved for NASAL specimens only), is one component of a comprehensive MRSA colonization surveillance program. It is not intended to diagnose MRSA infection nor to guide or monitor treatment for MRSA infections. RESULT CALLED TO, READ BACK BY AND VERIFIED WITH: M. Lucretia Roers [phone] 07.31.2018 N. MORRIS     Radiology Reports Dg Chest 1 View  Result Date: 06/30/2017 CLINICAL DATA:  Mental status change, incontinence. EXAM: CHEST 1 VIEW COMPARISON:  Chest x-ray of March 16, 2016 FINDINGS: The lungs are adequately inflated. The interstitial markings are mildly increased. There is no alveolar infiltrate or pleural effusion. The heart is top-normal in size. The central pulmonary vascularity is mildly prominent. There are degenerative changes of both shoulders. IMPRESSION: The findings suggest low-grade CHF.  There is no alveolar pneumonia. Electronically Signed   By: David  [place] M.D.   On: 06/25/2017 15:41   Ct Head Wo Contrast  Result Date: 07/02/2017 CLINICAL DATA:  Confusion with incontinence EXAM: CT HEAD WITHOUT CONTRAST TECHNIQUE: Contiguous axial images were obtained from the base of the skull through the vertex without intravenous contrast. COMPARISON:  July 26, 2014 FINDINGS: Brain: There is mild diffuse atrophy. There is a recent appearing infarct in the medial left occipital lobe with decreased attenuation. There is sulcal effacement in the posterior left  parietal lobe, potentially a second focus of early infarct. There is no  edema in this area appreciable. There is no mass, hemorrhage, or extra-axial fluid collection. No midline shift. Elsewhere, there is slight small vessel disease in the centra semiovale bilaterally. Vascular: There is subtle increased attenuation in the proximal left middle cerebral artery compared to prior study. This appearance may represent early hyperdense vessel and may be indicative of early acute infarct in the left middle cerebral artery distribution. There is calcification in each carotid siphon region. Skull: Bony calvarium appears intact. Sinuses/Orbits: Visualized paranasal sinuses are clear. Visualized orbits appear symmetric bilaterally. Other: Mastoid air cells are clear. IMPRESSION: Recent appearing infarct in the left medial occipital lobe. Suspect early acute infarct in the left parietal lobe with questionable early hyperdense vessel change in the left middle cerebral artery. Given these findings, correlation with MR pre and post-contrast advised to further evaluate. No mass or hemorrhage evident. No midline shift. Foci of calcification noted in each carotid siphon. Electronically Signed   By: Bretta Bang III M.D.   On: 15-Jul-2017 16:04   Mr Brain Wo Contrast (neuro Protocol)  Result Date: 07/15/2017 CLINICAL DATA:  Altered mental status and weakness EXAM: MRI HEAD WITHOUT CONTRAST MRA HEAD WITHOUT CONTRAST TECHNIQUE: Multiplanar, multiecho pulse sequences of the brain and surrounding structures were obtained without intravenous contrast. Angiographic images of the head were obtained using MRA technique without contrast. COMPARISON:  None. FINDINGS: MRI HEAD FINDINGS Brain: The midline structures are normal. There is a large area of diffusion restriction within the left posterior cerebral artery distribution. There are scattered other small foci of diffusion restriction within both cerebellar hemispheres, the left thalamus and anterior temporal lobe and multiple sites in the left parietal  lobe and both frontal lobes. There is multifocal hyperintense T2-weighted signal within the periventricular white matter, most often seen in the setting of chronic microvascular ischemia. No mass lesion. There is petechial hemorrhage throughout the left PCA distribution. No hydrocephalus, age advanced atrophy or lobar predominant volume loss. No dural abnormality or extra-axial collection. Skull and upper cervical spine: The visualized skull base, calvarium, upper cervical spine and extracranial soft tissues are normal. Sinuses/Orbits: No fluid levels or advanced mucosal thickening. No mastoid effusion. Normal orbits. MRA HEAD FINDINGS Intracranial internal carotid arteries: Normal. Anterior cerebral arteries: Normal. Middle cerebral arteries: Normal. Posterior communicating arteries: Present on the right. Posterior cerebral arteries: The left PCA is occluded in its mid P2 segment. The right PCA is normal. Basilar artery: Normal. Vertebral arteries: Codominant Normal. Superior cerebellar arteries: Normal. Anterior inferior cerebellar arteries: Normal. Posterior inferior cerebellar arteries: Normal. IMPRESSION: 1. Left PCA territory acute infarct with associated diffuse petechial hemorrhage throughout the infarct site, without mass effect. 2. Occlusion of left posterior cerebral artery at the mid P2 segment. 3. Scattered subcentimeter foci of acute ischemia within both cerebellar hemispheres, left thalamus, left anterior temporal lobe, left parietal lobe and both frontal lobes. The distribution suggests a central cardioembolic process. Electronically Signed   By: Deatra Robinson M.D.   On: 07/15/2017 17:46   Mr Maxine Glenn Head (cerebral Arteries)  Result Date: 2017-07-15 CLINICAL DATA:  Altered mental status and weakness EXAM: MRI HEAD WITHOUT CONTRAST MRA HEAD WITHOUT CONTRAST TECHNIQUE: Multiplanar, multiecho pulse sequences of the brain and surrounding structures were obtained without intravenous contrast. Angiographic  images of the head were obtained using MRA technique without contrast. COMPARISON:  None. FINDINGS: MRI HEAD FINDINGS Brain: The midline structures are normal. There is a large area of diffusion  restriction within the left posterior cerebral artery distribution. There are scattered other small foci of diffusion restriction within both cerebellar hemispheres, the left thalamus and anterior temporal lobe and multiple sites in the left parietal lobe and both frontal lobes. There is multifocal hyperintense T2-weighted signal within the periventricular white matter, most often seen in the setting of chronic microvascular ischemia. No mass lesion. There is petechial hemorrhage throughout the left PCA distribution. No hydrocephalus, age advanced atrophy or lobar predominant volume loss. No dural abnormality or extra-axial collection. Skull and upper cervical spine: The visualized skull base, calvarium, upper cervical spine and extracranial soft tissues are normal. Sinuses/Orbits: No fluid levels or advanced mucosal thickening. No mastoid effusion. Normal orbits. MRA HEAD FINDINGS Intracranial internal carotid arteries: Normal. Anterior cerebral arteries: Normal. Middle cerebral arteries: Normal. Posterior communicating arteries: Present on the right. Posterior cerebral arteries: The left PCA is occluded in its mid P2 segment. The right PCA is normal. Basilar artery: Normal. Vertebral arteries: Codominant Normal. Superior cerebellar arteries: Normal. Anterior inferior cerebellar arteries: Normal. Posterior inferior cerebellar arteries: Normal. IMPRESSION: 1. Left PCA territory acute infarct with associated diffuse petechial hemorrhage throughout the infarct site, without mass effect. 2. Occlusion of left posterior cerebral artery at the mid P2 segment. 3. Scattered subcentimeter foci of acute ischemia within both cerebellar hemispheres, left thalamus, left anterior temporal lobe, left parietal lobe and both frontal lobes.  The distribution suggests a central cardioembolic process. Electronically Signed   By: Deatra Robinson M.D.   On: 07/03/2017 17:46    Lab Data:  CBC:  Recent Labs Lab 06/28/2017 1605 07/07/17 0335  WBC 10.2 8.3  NEUTROABS 8.0*  --   HGB 10.3* 8.5*  HCT 31.9* 26.6*  MCV 88.4 86.1  PLT 134* 100*   Basic Metabolic Panel:  Recent Labs Lab 07/06/2017 1605 07/07/17 0335  NA 140 138  K 3.2* 3.0*  CL 99* 104  CO2 31 29  GLUCOSE 198* 67  BUN 31* 20  CREATININE 1.31* 1.21  CALCIUM 8.5* 7.7*   GFR: Estimated Creatinine Clearance: 63.5 mL/min (by C-G formula based on SCr of 1.21 mg/dL). Liver Function Tests:  Recent Labs Lab 06/09/2017 1605  AST 29  ALT 17  ALKPHOS 64  BILITOT 0.9  PROT 9.3*  ALBUMIN 2.4*   No results for input(s): LIPASE, AMYLASE in the last 168 hours. No results for input(s): AMMONIA in the last 168 hours. Coagulation Profile:  Recent Labs Lab 07/06/17 0146 07/07/17 0335  INR 1.49 1.54   Cardiac Enzymes:  Recent Labs Lab 07/01/2017 1605 07/06/17 0146 07/06/17 0830 07/06/17 1337 07/06/17 1846  CKTOTAL  --   --   --   --  41*  TROPONINI 0.34* 0.39* 0.44* 0.48*  --    BNP (last 3 results) No results for input(s): PROBNP in the last 8760 hours. HbA1C:  Recent Labs  07/06/17 0146  HGBA1C 8.0*   CBG:  Recent Labs Lab 07/06/17 2200 07/07/17 0841 07/07/17 0901 07/07/17 0932 07/07/17 1024  GLUCAP 147* 37* 103* 71 99   Lipid Profile:  Recent Labs  07/06/17 0428  CHOL 101  HDL 25*  LDLCALC 62  TRIG 69  CHOLHDL 4.0   Thyroid Function Tests:  Recent Labs  07/06/17 0146  TSH 6.284*  FREET4 0.91   Anemia Panel: No results for input(s): VITAMINB12, FOLATE, FERRITIN, TIBC, IRON, RETICCTPCT in the last 72 hours. Urine analysis:    Component Value Date/Time   COLORURINE YELLOW 06/08/2017 1439   APPEARANCEUR CLOUDY (A) 06/12/2017  1439   LABSPEC 1.015 06/13/2017 1439   PHURINE 6.0 06/14/2017 1439   GLUCOSEU 50 (A) 06/23/2017  1439   HGBUR LARGE (A) 06/28/2017 1439   BILIRUBINUR NEGATIVE 06/23/2017 1439   KETONESUR NEGATIVE 07/04/2017 1439   PROTEINUR 100 (A) 07/06/2017 1439   UROBILINOGEN 0.2 07/28/2014 0736   NITRITE NEGATIVE 06/10/2017 1439   LEUKOCYTESUR LARGE (A) 06/23/2017 1439     Liberato Stansbery M.D. [facility] 07/07/2017, 12:13 PM  Pager: [phone] Between 7am to 7pm - call Pager - [phone]  After 7pm go to [email or web address] - password [email or web address]  Call night coverage person covering after 7pm

## 2017-07-07 NOTE — Care Management Note (Addendum)
Case Management Note  Patient Details  Name: Garrett Bradley MRN: [id number] Date of Birth: 08/09/1934  Subjective/Objective:   Family called EMS when patient did not answer his door, EMS found patient sitting in chair soiled in urine and stool, foley bag full of urine and hospital armband  On dated 1217, pill bottles dated with same date and empty.  APS consulted per ED note.  HE ambulates with a walker, presents with FTT with confusion with new onset afib and cardioembolic strokes , HTN, HLD, DM, CKD, Malnutrition,anemia, thrush,on iv abx.  Most likely will need SNF. Per pt eval rec SNF.  CSW aware.   8/7 [name] RN - AMS, CHF, new acute hemorraghic infarct, new onset afib, neuro and palliative following, poorly responsive.  Family wants to continue current care for 2-3 days ,they are hopeful for some improvement, conts on iv abx, d5.  Plan is SNF vs Hospice.  8/9 [name] RN, BSN - NCM called daughter's, Maxine GlennMonica,  phone, did not get an answer.  8/10 [name] RN, BSN - NCM called daughter, Maxine GlennMonica, phone, left a message for return call.                Action/Plan: NCM will follow along with CSW for dc needs.   Expected Discharge Date:                  Expected Discharge Plan:  Skilled Nursing Facility  In-House Referral:  Clinical Social Work  Discharge planning Services  CM Consult  Post Acute Care Choice:    Choice offered to:     DME Arranged:    DME Agency:     HH Arranged:    HH Agency:     Status of Service:  Completed, signed off  If discussed at [employer] Meetings, dates discussed:    Additional Comments:  Leone Havenaylor, Javonne Dorko Clinton, RN 07/07/2017, 3:45 PM

## 2017-07-07 DEATH — deceased

## 2017-07-08 LAB — URINE CULTURE

## 2017-07-08 LAB — GLUCOSE, CAPILLARY
GLUCOSE-CAPILLARY: 89 mg/dL (ref 65–99)
GLUCOSE-CAPILLARY: 150 mg/dL — AB (ref 65–99)
Glucose-Capillary: 96 mg/dL (ref 65–99)
Glucose-Capillary: 134 mg/dL — ABNORMAL HIGH (ref 65–99)
Glucose-Capillary: 182 mg/dL — ABNORMAL HIGH (ref 65–99)
Glucose-Capillary: 195 mg/dL — ABNORMAL HIGH (ref 65–99)

## 2017-07-08 MED ORDER — POTASSIUM CHLORIDE 10 MEQ/100ML IV SOLN: 10.0000 meq | INTRAVENOUS | Status: AC

## 2017-07-08 MED ORDER — ASPIRIN 325 MG PO TABS
325.0000 mg | ORAL_TABLET | Freq: Every day | ORAL | Status: DC
  Filled 2017-07-08 (×3): qty 1

## 2017-07-08 MED ORDER — ASPIRIN 300 MG RE SUPP
300.0000 mg | Freq: Every day | RECTAL | Status: DC
  Administered 2017-07-09 – 2017-07-10 (×2): 300 mg via RECTAL
  Filled 2017-07-08 (×2): qty 1

## 2017-07-08 NOTE — Progress Notes (Signed)
[facility]                                                                              Patient Demographics  Garrett Bradley, is a 83 y.o. male, DOB - [birth year], ZHY:[id number]  Admit date - Jul 28, 2017   Admitting Physician [name], MD  Outpatient Primary MD for the patient is Assunta Found, MD  Outpatient specialists:   LOS - 3  days   Medical records reviewed and are as summarized below:    Chief Complaint  Patient presents with  . Failure To Thrive       Brief summary   Patient is a 83 year old male with hypothyroidism, hypertension, BPH, diabetes presented due to confusion, acute encephalopathy. Family had called EMS because patient did not answer the door. Per EMS patient was sitting in the chair, soiled in urine and stool. Patient had Hospital on the end on patient's arm dated December 2017 and med bottles were dated the same date and were empty. Patient reported that he had not been changed in 2 weeks. CT head on admission showed recent appearing infarct in the left medial occipital lobe, early acute infarct in the left parietal lobe. Patient was admitted for further workup.  Assessment & Plan    Principal Problem:   Acute CVA (cerebral vascular accident) (HCC) - CT head showed recent appearing infarct in the left medial occipital lobe, early acute infarct in the left parietal lobe,  early hypodense vessel change in the left middle cerebral artery - MRI showed a left PCA territory acute infarct with associated diffuse petechial hemorrhage throughout the infarct site. Occlusion of the left posterior cerebral artery at the mid P2 segment. Scattered subcentimeter foci of acute ischemia within both cerebral hemispheres, left thalamus and left anterior temporal lobe, parietal lobe and both frontal lobes suggesting central cardioembolic process - 2-D echo showed EF 35-40%, moderate diffuse hypokinesis, intraventricular conduction delay  - Carotid Doppler  showed no significant ICA stenosis - Neurology was consulted, recommended aspirin and avoiding and evaluation due to petechial hemorrhage - LDL 62, continue aspirin, on Lipitor 20 mg daily  - Cardiology was consulted, patient seen by Dr Elease Hashimoto, per recommendations ECG showed atrial fibrillation, occasional normal sinus rhythm, had stroke and will eventually need to be started on oral anticoagulation. Evidence of hemorrhagic transformation of the stroke so we'll defer to neurology as to timing of initiation of AC. - Per neurology, continue aspirin 325 mg for now. Due to her to fibrillation, recommend anticoagulation for stroke prevention in 7 days due to moderate-sized infarct and petechial hemorrhage. Once [facility] started aspirin can be discontinued. D/w Dr Roda Shutters, neurology recommended eliquis.  However if INR remains high, Coumadin will be better choice.  - Patient has been too lethargic, getting hypoglycemic, palliative consult placed for goals of care  Active Problems:   Mildly elevated troponins - Likely secondary to above, cardiology consulted, per cardiology likely due to demand ischemia - 2-D echo showed EF of 35-40% with moderate diffuse hypokinesis - No invasive workup recommended by cardiology at this time  Acute Systolic CHF - 2-D echo showed EF of 35-40%, cardiology consulted -  Per cardiology poor candidate for invasive cardiac procedures, will treat with standard medical management for CHF    Failure to thrive (adult) - Patient reportedly found in feces and urine with Hospital bracelet from December 2017, his medications were not filled since December -  will need skilled nursing facility on discharge  MRSA  UTI -  patient also had indwelling Foley catheter on admission which had not been changed - Urine culture showing more than 100,000 colonies of staph aureus - Continue vancomycin   Essential hypertension - Allow for permissive hypertension given the acute stroke - will continue  Cardizem for hypertension at discharge   Diabetes mellitus type 2 with hypoglycemia - Placed on D5 drip, hold long-acting insulin -Hemoglobin A1c 8.0   Hypothyroidism - TSH 6.2, continue Synthroid. Unclear if patient was taking his medications, does not appear to have filled after December 2017 - Synthroid restarted, repeat thyroid testing in 4-6 weeks   Moderate malnutrition - Nutrition consulted, continue supplementation  Hypokalemia - Placed on IV potassium replacement   Thrush - continue Diflucan  Chronic normocytic anemia - Hemoglobin appears to be at baseline  Code Status: * full   DVT Prophylaxis:   SCD's Family Communication: No family member at the bedside   Disposition Plan:  Will need SNF, palliative care meeting pending  Time Spent in minutes   25 minutes  Procedures:  MRI/MRA brain   2-D echo  Consultants:   Neurology   Antimicrobials:      Medications  Scheduled Meds: . aspirin EC  325 mg Oral Daily  . atorvastatin  20 mg Oral q1800  . Chlorhexidine Gluconate Cloth  6 each Topical Q0600  . citalopram  20 mg Oral Daily  . feeding supplement (ENSURE ENLIVE)  237 mL Oral BID BM  . fluconazole  100 mg Oral Daily  . insulin aspart  0-5 Units Subcutaneous QHS  . insulin aspart  0-9 Units Subcutaneous TID WC  . latanoprost  1 drop Both Eyes QHS  . levothyroxine  137 mcg Oral QAC breakfast  . mouth rinse  15 mL Mouth Rinse BID  . mupirocin ointment  1 application Nasal BID  . potassium chloride  20 mEq Oral BID  . senna  1 tablet Oral Daily  . silver sulfADIAZINE   Topical Daily  . tamsulosin  0.4 mg Oral QPC supper   Continuous Infusions: . cefTRIAXone (ROCEPHIN)  IV Stopped (07/07/17 2119)  . dextrose 5 % and 0.45% NaCl 50 mL/hr (07/07/17 1643)  . vancomycin Stopped (07/07/17 2255)   PRN Meds:.acetaminophen **OR** acetaminophen (TYLENOL) oral liquid 160 mg/5 mL **OR** acetaminophen, ALPRAZolam, senna-docusate   Antibiotics    Anti-infectives    Start     Dose/Rate Route Frequency Ordered Stop   07/06/17 2200  vancomycin (VANCOCIN) 1,250 mg in sodium chloride 0.9 % 250 mL IVPB     1,250 mg 166.7 mL/hr over 90 Minutes Intravenous Every 24 hours 07/06/17 1020     07/06/17 1000  vancomycin (VANCOCIN) IVPB 1000 mg/200 mL premix  Status:  Discontinued     1,000 mg 200 mL/hr over 60 Minutes Intravenous Every 12 hours July 01, 2017 2138 07/06/17 1017   07/06/17 1000  fluconazole (DIFLUCAN) tablet 100 mg     100 mg Oral Daily July 01, 2017 2319 07/09/17 0959   July 01, 2017 2200  vancomycin (VANCOCIN) 1,500 mg in sodium chloride 0.9 % 500 mL IVPB  Status:  Discontinued     1,500 mg 250 mL/hr over 120 Minutes Intravenous  Once July 01, 2017  2135 07/06/17 1017   01/03/2017 2145  cefTRIAXone (ROCEPHIN) 1 g in dextrose 5 % 50 mL IVPB     1 g 100 mL/hr over 30 Minutes Intravenous Every 24 hours 01/03/2017 2144          Subjective:   Charlynn CourtJames Stoneman was seen and examined today. Confused, unable to provide any review of systems. Low-grade fevers 100F this morning.   Objective:   Vitals:   07/07/17 2312 07/08/17 0326 07/08/17 0759 07/08/17 1210  BP: (!) 158/91 (!) 152/93 115/78 (!) 169/101  Pulse: (!) 53 62 81 80  Resp: (!) 23 (!) 33 (!) 23 (!) 23  Temp: 98.8 F (37.1 C) 99.3 F (37.4 C) 99 F (37.2 C) 100 F (37.8 C)  TempSrc: Oral Oral Axillary Axillary  SpO2: 97% 94% 98% 97%  Weight:      Height:        Intake/Output Summary (Last 24 hours) at 07/08/17 1300 Last data filed at 07/08/17 0600  Gross per 24 hour  Intake          1160.84 ml  Output             1050 ml  Net           110.84 ml     Wt Readings from Last 3 Encounters:  01/03/2017 104.3 kg (230 lb)  11/26/16 106.1 kg (234 lb)  11/07/16 106.1 kg (234 lb)     Exam  General: Awake but confused, not responding to questions, states yes or no or shaking his head,  Eyes:   HEENT:  Atraumatic, normocephalic, normal oropharynx  Cardiovascular: S1 and S2  clear, irregularly irregular  Respiratory: Clear to auscultation bilaterally, no wheezing, rales or rhonchi  Gastrointestinal: Soft, nontender, nondistended, + bowel sounds  Ext: bilateral lower extremity wrapped  Neuro: not following commands  Musculoskeletal: No digital cyanosis, clubbing  Skin: Bilateral lower extremity wrapped  Psych: confused   Data Reviewed:  I have personally reviewed following labs and imaging studies  Micro Results Recent Results (from the past 240 hour(s))  Urine culture     Status: Abnormal   Collection Time: 01/03/2017  2:39 PM  Result Value Ref Range Status   Specimen Description URINE, RANDOM  Final   Special Requests NONE  Final   Culture (A)  Final    >=100,000 COLONIES/mL METHICILLIN RESISTANT STAPHYLOCOCCUS AUREUS   Report Status 07/08/2017 FINAL  Final   Organism ID, Bacteria METHICILLIN RESISTANT STAPHYLOCOCCUS AUREUS (A)  Final      Susceptibility   Methicillin resistant staphylococcus aureus - MIC*    CIPROFLOXACIN >=8 RESISTANT Resistant     GENTAMICIN <=0.5 SENSITIVE Sensitive     NITROFURANTOIN <=16 SENSITIVE Sensitive     OXACILLIN >=4 RESISTANT Resistant     TETRACYCLINE <=1 SENSITIVE Sensitive     VANCOMYCIN 1 SENSITIVE Sensitive     TRIMETH/SULFA <=10 SENSITIVE Sensitive     CLINDAMYCIN <=0.25 SENSITIVE Sensitive     RIFAMPIN <=0.5 SENSITIVE Sensitive     Inducible Clindamycin NEGATIVE Sensitive     * >=100,000 COLONIES/mL METHICILLIN RESISTANT STAPHYLOCOCCUS AUREUS  MRSA PCR Screening     Status: Abnormal   Collection Time: 07/06/17  1:00 AM  Result Value Ref Range Status   MRSA by PCR POSITIVE (A) NEGATIVE Final    Comment:        The GeneXpert MRSA Assay (FDA approved for NASAL specimens only), is one component of a comprehensive MRSA colonization surveillance program. It is not intended  to diagnose MRSA infection nor to guide or monitor treatment for MRSA infections. RESULT CALLED TO, READ BACK BY AND VERIFIED  WITH: M. Lucretia Roers [phone] 07.31.2018 N. MORRIS     Radiology Reports Dg Chest 1 View  Result Date: 2017-07-07 CLINICAL DATA:  Mental status change, incontinence. EXAM: CHEST 1 VIEW COMPARISON:  Chest x-ray of March 16, 2016 FINDINGS: The lungs are adequately inflated. The interstitial markings are mildly increased. There is no alveolar infiltrate or pleural effusion. The heart is top-normal in size. The central pulmonary vascularity is mildly prominent. There are degenerative changes of both shoulders. IMPRESSION: The findings suggest low-grade CHF.  There is no alveolar pneumonia. Electronically Signed   By: David  [place] M.D.   On: 2017/07/07 15:41   Ct Head Wo Contrast  Result Date: 2017-07-07 CLINICAL DATA:  Confusion with incontinence EXAM: CT HEAD WITHOUT CONTRAST TECHNIQUE: Contiguous axial images were obtained from the base of the skull through the vertex without intravenous contrast. COMPARISON:  July 26, 2014 FINDINGS: Brain: There is mild diffuse atrophy. There is a recent appearing infarct in the medial left occipital lobe with decreased attenuation. There is sulcal effacement in the posterior left parietal lobe, potentially a second focus of early infarct. There is no edema in this area appreciable. There is no mass, hemorrhage, or extra-axial fluid collection. No midline shift. Elsewhere, there is slight small vessel disease in the centra semiovale bilaterally. Vascular: There is subtle increased attenuation in the proximal left middle cerebral artery compared to prior study. This appearance may represent early hyperdense vessel and may be indicative of early acute infarct in the left middle cerebral artery distribution. There is calcification in each carotid siphon region. Skull: Bony calvarium appears intact. Sinuses/Orbits: Visualized paranasal sinuses are clear. Visualized orbits appear symmetric bilaterally. Other: Mastoid air cells are clear. IMPRESSION: Recent appearing infarct in the  left medial occipital lobe. Suspect early acute infarct in the left parietal lobe with questionable early hyperdense vessel change in the left middle cerebral artery. Given these findings, correlation with MR pre and post-contrast advised to further evaluate. No mass or hemorrhage evident. No midline shift. Foci of calcification noted in each carotid siphon. Electronically Signed   By: Bretta Bang III M.D.   On: 07-Jul-2017 16:04   Mr Brain Wo Contrast (neuro Protocol)  Result Date: 2017/07/07 CLINICAL DATA:  Altered mental status and weakness EXAM: MRI HEAD WITHOUT CONTRAST MRA HEAD WITHOUT CONTRAST TECHNIQUE: Multiplanar, multiecho pulse sequences of the brain and surrounding structures were obtained without intravenous contrast. Angiographic images of the head were obtained using MRA technique without contrast. COMPARISON:  None. FINDINGS: MRI HEAD FINDINGS Brain: The midline structures are normal. There is a large area of diffusion restriction within the left posterior cerebral artery distribution. There are scattered other small foci of diffusion restriction within both cerebellar hemispheres, the left thalamus and anterior temporal lobe and multiple sites in the left parietal lobe and both frontal lobes. There is multifocal hyperintense T2-weighted signal within the periventricular white matter, most often seen in the setting of chronic microvascular ischemia. No mass lesion. There is petechial hemorrhage throughout the left PCA distribution. No hydrocephalus, age advanced atrophy or lobar predominant volume loss. No dural abnormality or extra-axial collection. Skull and upper cervical spine: The visualized skull base, calvarium, upper cervical spine and extracranial soft tissues are normal. Sinuses/Orbits: No fluid levels or advanced mucosal thickening. No mastoid effusion. Normal orbits. MRA HEAD FINDINGS Intracranial internal carotid arteries: Normal. Anterior cerebral arteries: Normal. Middle  cerebral arteries: Normal. Posterior communicating arteries: Present on the right. Posterior cerebral arteries: The left PCA is occluded in its mid P2 segment. The right PCA is normal. Basilar artery: Normal. Vertebral arteries: Codominant Normal. Superior cerebellar arteries: Normal. Anterior inferior cerebellar arteries: Normal. Posterior inferior cerebellar arteries: Normal. IMPRESSION: 1. Left PCA territory acute infarct with associated diffuse petechial hemorrhage throughout the infarct site, without mass effect. 2. Occlusion of left posterior cerebral artery at the mid P2 segment. 3. Scattered subcentimeter foci of acute ischemia within both cerebellar hemispheres, left thalamus, left anterior temporal lobe, left parietal lobe and both frontal lobes. The distribution suggests a central cardioembolic process. Electronically Signed   By: Deatra Robinson M.D.   On: 07/06/2017 17:46   Mr Maxine Glenn Head (cerebral Arteries)  Result Date: 06/25/2017 CLINICAL DATA:  Altered mental status and weakness EXAM: MRI HEAD WITHOUT CONTRAST MRA HEAD WITHOUT CONTRAST TECHNIQUE: Multiplanar, multiecho pulse sequences of the brain and surrounding structures were obtained without intravenous contrast. Angiographic images of the head were obtained using MRA technique without contrast. COMPARISON:  None. FINDINGS: MRI HEAD FINDINGS Brain: The midline structures are normal. There is a large area of diffusion restriction within the left posterior cerebral artery distribution. There are scattered other small foci of diffusion restriction within both cerebellar hemispheres, the left thalamus and anterior temporal lobe and multiple sites in the left parietal lobe and both frontal lobes. There is multifocal hyperintense T2-weighted signal within the periventricular white matter, most often seen in the setting of chronic microvascular ischemia. No mass lesion. There is petechial hemorrhage throughout the left PCA distribution. No  hydrocephalus, age advanced atrophy or lobar predominant volume loss. No dural abnormality or extra-axial collection. Skull and upper cervical spine: The visualized skull base, calvarium, upper cervical spine and extracranial soft tissues are normal. Sinuses/Orbits: No fluid levels or advanced mucosal thickening. No mastoid effusion. Normal orbits. MRA HEAD FINDINGS Intracranial internal carotid arteries: Normal. Anterior cerebral arteries: Normal. Middle cerebral arteries: Normal. Posterior communicating arteries: Present on the right. Posterior cerebral arteries: The left PCA is occluded in its mid P2 segment. The right PCA is normal. Basilar artery: Normal. Vertebral arteries: Codominant Normal. Superior cerebellar arteries: Normal. Anterior inferior cerebellar arteries: Normal. Posterior inferior cerebellar arteries: Normal. IMPRESSION: 1. Left PCA territory acute infarct with associated diffuse petechial hemorrhage throughout the infarct site, without mass effect. 2. Occlusion of left posterior cerebral artery at the mid P2 segment. 3. Scattered subcentimeter foci of acute ischemia within both cerebellar hemispheres, left thalamus, left anterior temporal lobe, left parietal lobe and both frontal lobes. The distribution suggests a central cardioembolic process. Electronically Signed   By: Deatra Robinson M.D.   On: 06/19/2017 17:46    Lab Data:  CBC:  Recent Labs Lab 06/06/2017 1605 07/07/17 0335  WBC 10.2 8.3  NEUTROABS 8.0*  --   HGB 10.3* 8.5*  HCT 31.9* 26.6*  MCV 88.4 86.1  PLT 134* 100*   Basic Metabolic Panel:  Recent Labs Lab 06/07/2017 1605 07/07/17 0335  NA 140 138  K 3.2* 3.0*  CL 99* 104  CO2 31 29  GLUCOSE 198* 67  BUN 31* 20  CREATININE 1.31* 1.21  CALCIUM 8.5* 7.7*   GFR: Estimated Creatinine Clearance: 63.5 mL/min (by C-G formula based on SCr of 1.21 mg/dL). Liver Function Tests:  Recent Labs Lab 06/07/2017 1605  AST 29  ALT 17  ALKPHOS 64  BILITOT 0.9  PROT  9.3*  ALBUMIN 2.4*   No results for input(s): LIPASE, AMYLASE  in the last 168 hours. No results for input(s): AMMONIA in the last 168 hours. Coagulation Profile:  Recent Labs Lab 07/06/17 0146 07/07/17 0335  INR 1.49 1.54   Cardiac Enzymes:  Recent Labs Lab 07/20/17 1605 07/06/17 0146 07/06/17 0830 07/06/17 1337 07/06/17 1846  CKTOTAL  --   --   --   --  41*  TROPONINI 0.34* 0.39* 0.44* 0.48*  --    BNP (last 3 results) No results for input(s): PROBNP in the last 8760 hours. HbA1C:  Recent Labs  07/06/17 0146  HGBA1C 8.0*   CBG:  Recent Labs Lab 07/07/17 1817 07/07/17 1959 07/07/17 2312 07/08/17 0337 07/08/17 0758  GLUCAP 78 75 81 89 96   Lipid Profile:  Recent Labs  07/06/17 0428  CHOL 101  HDL 25*  LDLCALC 62  TRIG 69  CHOLHDL 4.0   Thyroid Function Tests:  Recent Labs  07/06/17 0146  TSH 6.284*  FREET4 0.91   Anemia Panel: No results for input(s): VITAMINB12, FOLATE, FERRITIN, TIBC, IRON, RETICCTPCT in the last 72 hours. Urine analysis:    Component Value Date/Time   COLORURINE YELLOW 07-20-2017 1439   APPEARANCEUR CLOUDY (A) 07-20-2017 1439   LABSPEC 1.015 July 20, 2017 1439   PHURINE 6.0 2017/07/20 1439   GLUCOSEU 50 (A) Jul 20, 2017 1439   HGBUR LARGE (A) 2017-07-20 1439   BILIRUBINUR NEGATIVE 07/20/2017 1439   KETONESUR NEGATIVE 07/20/2017 1439   PROTEINUR 100 (A) 20-Jul-2017 1439   UROBILINOGEN 0.2 07/28/2014 0736   NITRITE NEGATIVE 07-20-2017 1439   LEUKOCYTESUR LARGE (A) 2017/07/20 1439     Haydyn Liddell M.D. [facility] 07/08/2017, 1:00 PM  Pager: [phone] Between 7am to 7pm - call Pager - [phone]  After 7pm go to [email or web address] - password [email or web address]  Call night coverage person covering after 7pm

## 2017-07-08 NOTE — Plan of Care (Signed)
Problem: Activity: Goal: Risk for activity intolerance will decrease Outcome: Not Progressing PT attempted to ambulate patient to chair. Patient was unable to stand with walker. Patient was moved to chair by PT and returned to bed after approximately 1.5 hours by unit staff.

## 2017-07-08 NOTE — Progress Notes (Signed)
CSW placed bed offers for SNF in patient room at pt son request- son to discuss with wife and provide CSW with choice.  CSW will continue to follow  Burna SisJenna H. Cheryln Balcom, LCSW Clinical Social Worker [phone]

## 2017-07-08 NOTE — Progress Notes (Signed)
Palliative Care  I called and spoke with the patient's son, [name], over the phone. He was appreciative of the call and asked to have a family meeting tomorrow (8/3) when he and his sister could be present. I shared that we would review the acute medical issues being addressed, as well as starting to explore different care trajectory options. He asked that we call in the morning to schedule a time to meet, he plans to talk with his sister today to clarify when they can both be at the hospital.   Plan -Palliative to call in the morning to schedule family meeting with pt's son and daughter   Murrell ConverseSarah Ciera Beckum [facility] Area[facility]NP-C Palliative Care [phone]  No charge note.

## 2017-07-08 NOTE — Progress Notes (Signed)
Physical Therapy Treatment Patient Details Name: Garrett PanderJames C Bradley MRN: [id number] DOB: 08/01/1934 Today's Date: 07/08/2017    History of Present Illness Pt adm from home with confusion. MRI showed large left PCA territory infarct as well as multifocal other likely embolic infarcts. When EMS called to the home pt found to have hospital armband on from 12/17. PMH - hypothyroidism; HTN; DM    PT Comments    Pt limited due to lethargy, cognition, and weakness. Pt shows decline since last session. Pt max assist +2 for bed mobility and transfers. Max cues of one/two word instructions required. Pt disoriented to place, time, situation. Pt able to initiate some movement with LUE, no initiation with RUE. Pt having vision deficits. When asked if he could see, pt replied "uh-huh" but could not correctly identify objects, colors, or number of fingers held up. Pt seemed anxious about mobilizing because of visual deficits. Pt's HR jumped from 96 (sitting) to 145 (standing) BPM during Tx. Next Tx to work on initiating core strength and EOB sitting balance to progress to safe transfers.   Follow Up Recommendations  SNF     Equipment Recommendations  None recommended by PT    Recommendations for Other Services       Precautions / Restrictions Precautions Precautions: Fall Restrictions Weight Bearing Restrictions: No    Mobility  Bed Mobility Overal bed mobility: Needs Assistance Bed Mobility: Supine to Sit     Supine to sit: Max assist;+2 for physical assistance;HOB elevated     General bed mobility comments: Pt needed max cues multiple times of few words. Pt having hard time initiating movement to grab rail, no initiation with RUE but good grip strength. Pt max assist to get from supine to EOB. Max assist to help pt scoot hips to EOB. Once EOB, pt was able to initiate core strength to hold himself up.   Transfers Overall transfer level: Needs assistance Equipment used: Rolling walker (2  wheeled) Transfers: Stand Pivot Transfers   Stand pivot transfers: +2 physical assistance;Max assist       General transfer comment: Moved pt from EOB to chair with RW, pt required tapping on LLE to initiate movement. Max cues and assistance with RLE. pt pivot to chair with max assist from PT/PTA. Heavy left lateral lean with narrow BoS in standing.   Ambulation/Gait                 Stairs            Wheelchair Mobility    Modified Rankin (Stroke Patients Only) Modified Rankin (Stroke Patients Only) Pre-Morbid Rankin Score: Moderately severe disability Modified Rankin: Severe disability     Balance Overall balance assessment: Needs assistance Sitting-balance support: Bilateral upper extremity supported;Feet supported Sitting balance-Leahy Scale: Poor Sitting balance - Comments: heavy lean R and posterior Postural control: Posterior lean;Right lateral lean Standing balance support: Bilateral upper extremity supported;During functional activity Standing balance-Leahy Scale: Poor                              Cognition Arousal/Alertness: Lethargic Behavior During Therapy: Flat affect Overall Cognitive Status: Difficult to assess Area of Impairment: Orientation;Attention;Memory;Following commands;Safety/judgement;Awareness                 Orientation Level: Disoriented to;Place;Time;Situation (stated last name not first) Current Attention Level: Sustained Memory: Decreased short-term memory Following Commands: Follows one step commands inconsistently Safety/Judgement: Decreased awareness of safety;Decreased awareness of deficits Awareness:  Intellectual Problem Solving: Slow processing;Decreased initiation;Difficulty sequencing;Requires verbal cues;Requires tactile cues General Comments: Pt knew last name but when asked for first name he kept repeating last name. Pt knew birthdate but couldnt remember place, month, or why he is here. Having issue  following commands. Pt would sometimes initiate with LUE when trying to grab for walker, no movement with R side. When asked if pt could see he would reply "uh huh" but couldn't account for number of fingers held up/colors/ or make direct eye contact with PTA or object. Perseverating on "yes."      Exercises      General Comments        Pertinent Vitals/Pain Pain Assessment: No/denies pain    Home Living                      Prior Function            PT Goals (current goals can now be found in the care plan section) Acute Rehab PT Goals Patient Stated Goal: pt unable to state Progress towards PT goals: Not progressing toward goals - comment (Pt decline since last session)    Frequency    Min 3X/week      PT Plan Current plan remains appropriate    Co-evaluation              AM-PAC PT "6 Clicks" Daily Activity  Outcome Measure  Difficulty turning over in bed (including adjusting bedclothes, sheets and blankets)?: Total Difficulty moving from lying on back to sitting on the side of the bed? : Total Difficulty sitting down on and standing up from a chair with arms (e.g., wheelchair, bedside commode, etc,.)?: Total Help needed moving to and from a bed to chair (including a wheelchair)?: A Lot Help needed walking in hospital room?: A Lot Help needed climbing 3-5 steps with a railing? : Total 6 Click Score: 8    End of Session Equipment Utilized During Treatment: Gait belt Activity Tolerance: Patient limited by lethargy Patient left: in chair;with call bell/phone within reach;with chair alarm set Nurse Communication: Mobility status PT Visit Diagnosis: Unsteadiness on feet (R26.81);Muscle weakness (generalized) (M62.81);Other symptoms and signs involving the nervous system (R29.898)     Time: [id number] PT Time Calculation (min) (ACUTE ONLY): 33 min  Charges:  $Therapeutic Activity: 23-37 mins                    G Codes:       Garrett Bradley,  SPTA [phone]   Garrett Bradley 07/08/2017, 4:32 PM

## 2017-07-08 NOTE — Progress Notes (Signed)
  Speech Language Pathology Treatment: Cognitive-Linquistic  Patient Details Name: Garrett Bradley MRN: [id number] DOB: 06/25/1934 Today's Date: 07/08/2017 Time: [id number] SLP Time Calculation (min) (ACUTE ONLY): 8 min  Assessment / Plan / Recommendation Clinical Impression  Initial portion of session was bedside swallow assessment and removal of large amount of eggs from oral cavity. Pt's awareness, alertness continues to be decreased today. Pt answered therapist's questions, no spontaneous verbalizations. Needed max assist to name common room objects; not oriented. ST will continue to follow.   HPI HPI: Garrett Bradley is a 83 y.o. male with a history of diabetes, hypertension who states that he has had some trouble with his vision for a couple of days. Apparently was found by his family to be confused. He was found disheveled, and soiled. He was brought into the emergency department where a head CT was performed which showed a hypodensity in the left occipital lobe. An MRI was therefore performed which shows a large left PCA territory infarct as well as multifocal other likely embolic infarcts. SLP observed significant pocketing 8/1 perhaps due to hypoglycemia. Today copious amount eggs in oral cavity and BSE order obtained.       SLP Plan  Continue with current plan of care       Recommendations  Medication Administration: Other (Comment) (IV) Compensations: Slow rate;Small sips/bites;Lingual sweep for clearance of pocketing;Minimize environmental distractions                Oral Care Recommendations: Oral care QID Follow up Recommendations: Skilled Nursing facility SLP Visit Diagnosis: Dysphagia, oral phase (R13.11) Plan: Continue with current plan of care       GO                Royce MacadamiaLitaker, [name] 07/08/2017, 1:02 PM  Breck CoonsLisa Willis Lonell FaceLitaker M.Ed [employer]-SLP Pager [phone]

## 2017-07-08 NOTE — Evaluation (Addendum)
Clinical/Bedside Swallow Evaluation Patient Details  Name: Garrett Bradley MRN: [id number] Date of Birth: 03/04/1934  Today's Date: 07/08/2017 Time: SLP Start Time (ACUTE ONLY): 1110 SLP Stop Time (ACUTE ONLY): 1118 SLP Time Calculation (min) (ACUTE ONLY): 8 min  Past Medical History:  Past Medical History:  Diagnosis Date  . Depression   . Diabetes mellitus   . Enlarged prostate    Status post TURP; self caths @ home  . Hypertension   . Hypothyroidism   . Renal disorder    Past Surgical History:  Past Surgical History:  Procedure Laterality Date  . HERNIA REPAIR    . TRANSURETHRAL RESECTION OF PROSTATE  09/22/2011   Procedure: TRANSURETHRAL RESECTION OF THE PROSTATE (TURP);  Surgeon: Ky BarbanMohammad I Javaid;  Location: [facility];  Service: Urology;  Laterality: N/A;   HPI:  Garrett PanderJames C Clarey is a 83 y.o. male with a history of diabetes, hypertension who states that he has had some trouble with his vision for a couple of days. Apparently was found by his family to be confused. He was found disheveled, and soiled. He was brought into the emergency department where a head CT was performed which showed a hypodensity in the left occipital lobe. An MRI was therefore performed which shows a large left PCA territory infarct as well as multifocal other likely embolic infarcts. SLP observed significant pocketing 8/1 perhaps due to hypoglycemia. Today copious amount eggs in oral cavity and BSE order obtained.    Assessment / Plan / Recommendation Clinical Impression  Pt consistently pocketing gross amount of eggs in oral cavity requiring total assist with suction to remove. Eventually held puree texture (pudding) in oral cavity and required suctioning despite max cueing. Poor oral containment with cup sips but able to express liquid from straw (intermittent difficulty with RN tech). Do not recommend solids of any texture at present due to severe oral holding likely due to decreased cognition, alertness and  awareness.  Recommend clear liquid diet (no jello) via straw, only when alert, extra time to transit and initiate swallow, sit upright and check oral cavity after all meals, meds via IV. ST will continue to treat. SLP Visit Diagnosis: Dysphagia, oral phase (R13.11)    Aspiration Risk   (mod-severe)    Diet Recommendation Thin liquid (clear liquids, no jello)   Liquid Administration via: Cup;Straw Medication Administration: Other (Comment) (IV) Supervision: Staff to assist with self feeding;Full supervision/cueing for compensatory strategies Compensations: Slow rate;Small sips/bites;Lingual sweep for clearance of pocketing;Minimize environmental distractions Postural Changes: Seated upright at 90 degrees    Other  Recommendations Oral Care Recommendations: Oral care QID   Follow up Recommendations Skilled Nursing facility      Frequency and Duration min 2x/week  2 weeks       Prognosis Prognosis for Safe Diet Advancement: Fair Barriers to Reach Goals: Cognitive deficits      Swallow Study   General HPI: Garrett PanderJames C Well is a 83 y.o. male with a history of diabetes, hypertension who states that he has had some trouble with his vision for a couple of days. Apparently was found by his family to be confused. He was found disheveled, and soiled. He was brought into the emergency department where a head CT was performed which showed a hypodensity in the left occipital lobe. An MRI was therefore performed which shows a large left PCA territory infarct as well as multifocal other likely embolic infarcts. SLP observed significant pocketing 8/1 perhaps due to hypoglycemia. Today copious amount eggs  in oral cavity and BSE order obtained.  Type of Study: Bedside Swallow Evaluation Previous Swallow Assessment: none Diet Prior to this Study: Regular;Thin liquids Temperature Spikes Noted: Yes Respiratory Status: Room air History of Recent Intubation: No Behavior/Cognition:  Lethargic/Drowsy;Requires cueing Oral Cavity Assessment: Other (comment) (copious residue from breakfast in oral cavity) Oral Care Completed by SLP: Yes Oral Cavity - Dentition: Edentulous Vision: Functional for self-feeding Self-Feeding Abilities: Needs assist;Needs set up Patient Positioning: Upright in bed Baseline Vocal Quality: Low vocal intensity Volitional Cough: Weak Volitional Swallow: Unable to elicit    Oral/Motor/Sensory Function Overall Oral Motor/Sensory Function: Within functional limits   Ice Chips Ice chips: Not tested   Thin Liquid Thin Liquid: Impaired Presentation: Cup;Straw Oral Phase Impairments: Reduced lingual movement/coordination Oral Phase Functional Implications: Oral holding (intermittent difficulty pulling liquid into straw) Pharyngeal  Phase Impairments: Suspected delayed Swallow    Nectar Thick Nectar Thick Liquid: Not tested   Honey Thick Honey Thick Liquid: Not tested   Puree Puree: Impaired Presentation: Spoon Oral Phase Impairments: Reduced lingual movement/coordination Oral Phase Functional Implications: Prolonged oral transit;Oral residue;Oral holding Pharyngeal Phase Impairments:  (none)   Solid   GO   Solid: Not tested        Royce MacadamiaLitaker, Luane Rochon Willis 07/08/2017,11:38 AM   Breck CoonsLisa Willis Lonell FaceLitaker M.Ed [employer] Pager [phone]
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
## 2017-07-09 ENCOUNTER — Inpatient Hospital Stay (HOSPITAL_COMMUNITY): Payer: Medicare Other

## 2017-07-09 DIAGNOSIS — Z515 Encounter for palliative care: Secondary | ICD-10-CM

## 2017-07-09 LAB — BASIC METABOLIC PANEL
ANION GAP: 8 (ref 5–15)
BUN: 17 mg/dL (ref 6–20)
CALCIUM: 8 mg/dL — AB (ref 8.9–10.3)
CO2: 26 mmol/L (ref 22–32)
Chloride: 105 mmol/L (ref 101–111)
Creatinine, Ser: 1.43 mg/dL — ABNORMAL HIGH (ref 0.61–1.24)
GFR, EST AFRICAN AMERICAN: 51 mL/min — AB (ref >60)
GFR, EST NON AFRICAN AMERICAN: 44 mL/min — AB (ref >60)
GLUCOSE: 174 mg/dL — AB (ref 65–99)
POTASSIUM: 3.6 mmol/L (ref 3.5–5.1)
Sodium: 139 mmol/L (ref 135–145)

## 2017-07-09 LAB — PROTIME-INR
INR: 1.64
Prothrombin Time: 19.6 seconds — ABNORMAL HIGH (ref 11.4–15.2)

## 2017-07-09 LAB — GLUCOSE, CAPILLARY
GLUCOSE-CAPILLARY: 136 mg/dL — AB (ref 65–99)
GLUCOSE-CAPILLARY: 183 mg/dL — AB (ref 65–99)
Glucose-Capillary: 138 mg/dL — ABNORMAL HIGH (ref 65–99)
Glucose-Capillary: 157 mg/dL — ABNORMAL HIGH (ref 65–99)

## 2017-07-09 LAB — VANCOMYCIN, TROUGH: VANCOMYCIN TR: 22 ug/mL — AB (ref 15–20)

## 2017-07-09 MED ORDER — HYDRALAZINE HCL 20 MG/ML IJ SOLN
10.0000 mg | Freq: Four times a day (QID) | INTRAMUSCULAR | Status: DC | PRN
  Administered 2017-07-09 – 2017-07-10 (×2): 10 mg via INTRAVENOUS
  Filled 2017-07-09 (×2): qty 1

## 2017-07-09 MED ORDER — VANCOMYCIN HCL IN DEXTROSE 1-5 GM/200ML-% IV SOLN
1000.0000 mg | INTRAVENOUS | Status: DC
  Administered 2017-07-10 – 2017-07-11 (×2): 1000 mg via INTRAVENOUS
  Filled 2017-07-09 (×2): qty 200

## 2017-07-09 NOTE — Progress Notes (Signed)
  Speech Language Pathology Treatment: Dysphagia;Cognitive-Linquistic  Patient Details Name: Garrett Bradley MRN: [id number] DOB: 11/02/1934 Today's Date: 07/09/2017 Time: [id number] SLP Time Calculation (min) (ACUTE ONLY): 15 min  Assessment / Plan / Recommendation Clinical Impression  Pt demonstrates significant change in function when compared with this SLP's first visit on Tuesday in which pt was alert, able to participate in conversation with aphasia, talking on the phone and using his LUE. Today pt keeps his eyes closed responded, Yeah to all questions, but also was able to say his name and "alright/" LUE flaccid, did not withdraw to pain, discussed with RN. SLP provided sips of thin liquids with max tactile and verbal cues for attention, but after pt starting using straw effectively he became more automatic. Pt also tolerated purees without oral holding, but continues to orally hold regular solids. Will upgrade diet to dys 1 (puree) with thin liquids.   HPI HPI: Garrett PanderJames C Chuba is a 83 y.o. male with a history of diabetes, hypertension who states that he has had some trouble with his vision for a couple of days. Apparently was found by his family to be confused. He was found disheveled, and soiled. He was brought into the emergency department where a head CT was performed which showed a hypodensity in the left occipital lobe. An MRI was therefore performed which shows a large left PCA territory infarct as well as multifocal other likely embolic infarcts. SLP observed significant pocketing 8/1 perhaps due to hypoglycemia. Today copious amount eggs in oral cavity and BSE order obtained.       SLP Plan  Continue with current plan of care       Recommendations  Diet recommendations: Dysphagia 1 (puree);Thin liquid Liquids provided via: Cup;Straw Medication Administration: Crushed with puree Supervision: Patient able to self feed Compensations: Slow rate;Small sips/bites;Lingual sweep for  clearance of pocketing;Minimize environmental distractions Postural Changes and/or Swallow Maneuvers: Seated upright 90 degrees                Oral Care Recommendations: Oral care BID Follow up Recommendations: Skilled Nursing facility SLP Visit Diagnosis: Dysphagia, oral phase (R13.11) Plan: Continue with current plan of care       GO               [facility] Surgery Center LLCBonnie Ciarra Braddy, MA CCC-SLP [phone]  Claudine MoutonDeBlois, Sohan Potvin Caroline 07/09/2017, 2:17 PM

## 2017-07-09 NOTE — Consult Note (Signed)
Consultation Note Date: 07/09/2017   Patient Name: Garrett PanderJames C Tworek  DOB: 07/31/1934  MRN: [id number]  Age / Sex: 83 y.o., male  PCP: [name], [name], MD Referring Physician: Cathren Harshai, Ripudeep K, MD  Reason for Consultation: Establishing goals of care  HPI/Patient Profile: 83 y.o. male  with past medical history of hypothyroidism, HTN, BPH, DM admitted on 07/06/2017 after being found by his family in his home, unable to get up and answer the door, sitting in a chair covered in urine and stool. He had an indwelling foley from December that has not been changed. Multiple medication bottles from December that had not been refilled were found at the bedside. APS consulted in ED. Workup revealed altered mental status, elevated BNP, mild CHF, and MRI showing diffuse petechial hemorrhage and cardioembolic CVA. Palliative medicine consulted for GOC.  Clinical Assessment and Goals of Care: Palliative provider Murrell ConverseSarah Dunn, NP spoke with patient's son yesterday who agreed to Palliative meeting today.   I evaluated patient at bedside. He appeared uncomfortable, lying in bed, eyes closed and brows furrowed. He did not open his eyes to my voice or physical touch. He would not engage in any conversation.   Chart review shows concern for dysphagia and decreased po intake. His neuro status appears to have worsened over the last few days, and he is showing signs of low grade fever of 99.4 axillary (reflecting 100.4 core). I would wonder if he may be starting to develop a pneumonia r/t his dysphagia.  I called Derrick and left a message requesting return call. Also spoke with case manager and appreciate their efforts at helping to coordinate family meeting.   Primary Decision Maker NEXT OF KIN - children- Son- [name] and a daughter who is coming in from out of town ([place] believe[place]    SUMMARY OF RECOMMENDATIONS -Full scope care for  now -PMT will continue to try and coordinate family meeting for GOC      Code Status/Advance Care Planning:  Full code   Palliative Prophylaxis:   Turn Reposition  Additional Recommendations (Limitations, Scope, Preferences):  Full Scope Treatment  Prognosis:    Unable to determine but very poor based on poor functional, cognitive, and nutritional status  Discharge Planning: To Be Determined  Primary Diagnoses: Present on Admission: . CVA (cerebral vascular accident) (HCC) . Adult failure to thrive . Anemia . CKD (chronic kidney disease) stage 3, GFR 30-59 ml/min . Diabetes mellitus type 2 with complications (HCC) . Hypertension . Hypothyroidism . Protein-calorie malnutrition, severe . Lower urinary tract infectious disease . New onset atrial fibrillation (HCC) . Elevated troponin . Thrush of mouth and esophagus (HCC)   I have reviewed the medical record, interviewed the patient and family, and examined the patient. The following aspects are pertinent.  Past Medical History:  Diagnosis Date  . Depression   . Diabetes mellitus   . Enlarged prostate    Status post TURP; self caths @ home  . Hypertension   . Hypothyroidism   . Renal disorder  Social History   Social History  . Marital status: Legally Separated    Spouse name: N/A  . Number of children: N/A  . Years of education: N/A   Social History Main Topics  . Smoking status: Never Smoker  . Smokeless tobacco: Never Used  . Alcohol use No  . Drug use: No  . Sexual activity: Not Currently   Other Topics Concern  . None   Social History Narrative  . None   Family History  Problem Relation Age of Onset  . Hypertension Mother   . Cerebral aneurysm Mother   . Hypertension Brother   . Heart disease Father        Heart attack  . Cancer Neg Hx   . Diabetes Neg Hx        only among cousins   Scheduled Meds: . aspirin  300 mg Rectal Daily   Or  . aspirin  325 mg Oral Daily  .  atorvastatin  20 mg Oral q1800  . Chlorhexidine Gluconate Cloth  6 each Topical Q0600  . citalopram  20 mg Oral Daily  . feeding supplement (ENSURE ENLIVE)  237 mL Oral BID BM  . insulin aspart  0-5 Units Subcutaneous QHS  . insulin aspart  0-9 Units Subcutaneous TID WC  . latanoprost  1 drop Both Eyes QHS  . levothyroxine  137 mcg Oral QAC breakfast  . mouth rinse  15 mL Mouth Rinse BID  . mupirocin ointment  1 application Nasal BID  . senna  1 tablet Oral Daily  . silver sulfADIAZINE   Topical Daily  . tamsulosin  0.4 mg Oral QPC supper   Continuous Infusions: . dextrose 5 % and 0.45% NaCl 75 mL/hr (07/09/17 1323)  . vancomycin Stopped (07/09/17 0001)   PRN Meds:.acetaminophen **OR** acetaminophen (TYLENOL) oral liquid 160 mg/5 mL **OR** acetaminophen, ALPRAZolam, hydrALAZINE, senna-docusate Medications Prior to Admission:  Prior to Admission medications   Medication Sig Start Date End Date Taking? Authorizing Provider  ALPRAZolam Prudy Feeler(XANAX) 1 MG tablet Take 1 tablet (1 mg total) by mouth at bedtime as needed for sleep. 03/23/16   Standley BrookingGoodrich, Daniel P, MD  citalopram (CELEXA) 20 MG tablet Take 1 tablet (20 mg total) by mouth daily. Patient not taking: Reported on 07/06/2017 03/23/16   Standley BrookingGoodrich, Daniel P, MD  fish oil-omega-3 fatty acids 1000 MG capsule Take 1 capsule (1 g total) by mouth 2 (two) times daily. Patient not taking: Reported on 07/06/2017 10/02/11   Christiane HaSullivan, Corinna L, MD  furosemide (LASIX) 20 MG tablet TAKE 40 MG DAILY AS NEEDED FOR LEG SWELLING. Patient not taking: Reported on 07/06/2017 06/19/16   Erick BlinksMemon, Jehanzeb, MD  senna (SENOKOT) 8.6 MG TABS tablet Take 1 tablet (8.6 mg total) by mouth daily. Patient not taking: Reported on 07/06/2017 03/11/16   Lavera GuiseLiu, Dana Duo, MD   Allergies  Allergen Reactions  . Thiazide-Type Diuretics Other (See Comments)    03/2016 acute wrist pain, presumed gout Uric acid 10.4   Review of Systems  Unable to perform ROS: Patient nonverbal     Physical Exam  Constitutional:  cachetic  Cardiovascular: Normal rate.   Pulmonary/Chest: Breath sounds normal.  Neurological:  Nonresponsive, does not follow commands  Skin: Skin is warm and dry.  Nursing note and vitals reviewed.   Vital Signs: BP 139/86 (BP Location: Right Arm)   Pulse 69   Temp 99.4 F (37.4 C) (Axillary)   Resp (!) 22   Ht 6\' 4"  (1.93 m)   Wt  104.3 kg (230 lb)   SpO2 95%   BMI 28.00 kg/m  Pain Assessment: PAINAD   Pain Score: 0-No pain   SpO2: SpO2: 95 % O2 Device:SpO2: 95 % O2 Flow Rate: .   IO: Intake/output summary:  Intake/Output Summary (Last 24 hours) at 07/09/17 1350 Last data filed at 07/09/17 0343  Gross per 24 hour  Intake              300 ml  Output             1000 ml  Net             -700 ml    LBM: Last BM Date: 06/19/2017 Baseline Weight: Weight: 104.3 kg (230 lb) Most recent weight: Weight: 104.3 kg (230 lb)     Palliative Assessment/Data: PPS: 20%     Thank you for this consult. Palliative medicine will continue to follow and assist as needed.   Total time: 60 minutes Greater than 50%  of this time was spent counseling and coordinating care related to the above assessment and plan.  Signed by: Ocie Bob, AGNP-C Palliative Medicine    Please contact Palliative Medicine Team phone at [phone] for questions and concerns.  For individual provider: See Loretha Stapler

## 2017-07-09 NOTE — Progress Notes (Signed)
Pharmacy Antibiotic Note  Garrett Bradley is a 83 y.o. male admitted on 06/20/2017 with UTI.  Pharmacy has been consulted for Vancomycin dosing. Scr fairly stable between 1.2-1.4.   Vancomycin trough tonight is elevated at 22  Plan: -Dec vancomycin to 1000 mg IV q24h -Re-check vancomycin trough as indicated   Height: 6\' 4"  (193 cm) Weight: 230 lb (104.3 kg) IBW/kg (Calculated) : 86.8  Temp (24hrs), Avg:99.2 F (37.3 C), Min:98 F (36.7 C), Max:99.8 F (37.7 C)   Recent Labs Lab 06/08/2017 1605 06/13/2017 1917 07/06/17 0146 07/06/17 0428 07/07/17 0335 07/09/17 0335 07/09/17 2228  WBC 10.2  --   --   --  8.3  --   --   CREATININE 1.31*  --   --   --  1.21 1.43*  --   LATICACIDVEN 2.1* 2.2* 1.1 0.9  --   --   --   VANCOTROUGH  --   --   --   --   --   --  22*    Estimated Creatinine Clearance: 53.8 mL/min (A) (by C-G formula based on SCr of 1.43 mg/dL (H)).    Allergies  Allergen Reactions  . Thiazide-Type Diuretics Other (See Comments)    03/2016 acute wrist pain, presumed gout Uric acid 10.4     Garrett Bradley, Garrett Bradley 07/09/2017 11:29 PM

## 2017-07-09 NOTE — Progress Notes (Signed)
[facility]                                                                              Patient Demographics  Garrett Bradley, is a 83 y.o. male, DOB - 05-11-1934, ZOX:[id number]  Admit date - 06/06/2017   Admitting Physician [name], MD  Outpatient Primary MD for the patient is Assunta Found, MD  Outpatient specialists:   LOS - 4  days   Medical records reviewed and are as summarized below:    Chief Complaint  Patient presents with  . Failure To Thrive       Brief summary   Patient is a 83 year old male with hypothyroidism, hypertension, BPH, diabetes presented due to confusion, acute encephalopathy. Family had called EMS because patient did not answer the door. Per EMS patient was sitting in the chair, soiled in urine and stool. Patient had Hospital on the end on patient's arm dated December 2017 and med bottles were dated the same date and were empty. Patient reported that he had not been changed in 2 weeks. CT head on admission showed recent appearing infarct in the left medial occipital lobe, early acute infarct in the left parietal lobe. Patient was admitted for further workup.  Assessment & Plan    Principal Problem:   Acute CVA (cerebral vascular accident) (HCC) - CT head showed recent appearing infarct in the left medial occipital lobe, early acute infarct in the left parietal lobe,  early hypodense vessel change in the left middle cerebral artery - MRI showed a left PCA territory acute infarct with associated diffuse petechial hemorrhage throughout the infarct site. Occlusion of the left posterior cerebral artery at the mid P2 segment. Scattered subcentimeter foci of acute ischemia within both cerebral hemispheres, left thalamus and left anterior temporal lobe, parietal lobe and both frontal lobes suggesting central cardioembolic process - 2-D echo showed EF 35-40%, moderate diffuse hypokinesis, intraventricular conduction delay  - Carotid Doppler  showed no significant ICA stenosis - Neurology was consulted, recommended aspirin and avoiding and evaluation due to petechial hemorrhage - LDL 62, continue aspirin, on Lipitor 20 mg daily  - Cardiology was consulted, patient seen by Dr Elease Hashimoto, per recommendations ECG showed atrial fibrillation, occasional normal sinus rhythm, had stroke and will eventually need to be started on oral anticoagulation.  - Per neurology, continue aspirin 325 mg for now. Due to her to fibrillation, recommend anticoagulation for stroke prevention in 7 days due to moderate-sized infarct and petechial hemorrhage. Once [facility] started aspirin can be discontinued. D/w Dr Roda Shutters, neurology recommended eliquis.  However if INR remains high, Coumadin will be better choice.  INR still trending high  - Palliative goals of care pending - On clear liquid diet per speech therapy  Active Problems:    Mildly elevated troponins - Likely secondary to above, cardiology consulted, per cardiology likely due to demand ischemia - 2-D echo showed EF of 35-40% with moderate diffuse hypokinesis - No invasive workup recommended by cardiology at this time  Acute Systolic CHF - 2-D echo showed EF of 35-40%, cardiology consulted - Per cardiology poor candidate for invasive cardiac procedures, will treat with standard medical  management for CHF    Failure to thrive (adult) - Patient reportedly found in feces and urine with Hospital bracelet from December 2017, his medications were not filled since December -  will need skilled nursing facility on discharge - Palliative goals of care pending  MRSA  UTI -  patient also had indwelling Foley catheter on admission which had not been changed - Urine culture showing more than 100,000 colonies of staph aureus - Continue vancomycin, DC IV Rocephin  Essential hypertension - Allow for permissive hypertension given the acute stroke - will continue Cardizem for hypertension at discharge   Diabetes  mellitus type 2 with hypoglycemia - Placed on D5 drip, hold long-acting insulin -Hemoglobin A1c 8.0   Hypothyroidism - TSH 6.2, continue Synthroid. Unclear if patient was taking his medications, does not appear to have filled after December 2017 - Synthroid restarted, repeat thyroid testing in 4-6 weeks   Moderate malnutrition - Nutrition consulted, continue supplementation  Acute kidney injury -Likely due to poor oral intake, increased IV fluids - Palliative goals of care pending    Thrush - continue Diflucan  Chronic normocytic anemia - Hemoglobin appears to be at baseline  Code Status: * full   DVT Prophylaxis:   SCD's Family Communication: No family member at the bedside   Disposition Plan:  Will need SNF, palliative care meeting pending  Time Spent in minutes   25 minutes  Procedures:  MRI/MRA brain   2-D echo  Consultants:   Neurology   Antimicrobials:      Medications  Scheduled Meds: . aspirin  300 mg Rectal Daily   Or  . aspirin  325 mg Oral Daily  . atorvastatin  20 mg Oral q1800  . Chlorhexidine Gluconate Cloth  6 each Topical Q0600  . citalopram  20 mg Oral Daily  . feeding supplement (ENSURE ENLIVE)  237 mL Oral BID BM  . insulin aspart  0-5 Units Subcutaneous QHS  . insulin aspart  0-9 Units Subcutaneous TID WC  . latanoprost  1 drop Both Eyes QHS  . levothyroxine  137 mcg Oral QAC breakfast  . mouth rinse  15 mL Mouth Rinse BID  . mupirocin ointment  1 application Nasal BID  . senna  1 tablet Oral Daily  . silver sulfADIAZINE   Topical Daily  . tamsulosin  0.4 mg Oral QPC supper   Continuous Infusions: . cefTRIAXone (ROCEPHIN)  IV Stopped (07/08/17 2141)  . dextrose 5 % and 0.45% NaCl 50 mL/hr (07/07/17 1643)  . vancomycin Stopped (07/09/17 0001)   PRN Meds:.acetaminophen **OR** acetaminophen (TYLENOL) oral liquid 160 mg/5 mL **OR** acetaminophen, ALPRAZolam, hydrALAZINE, senna-docusate   Antibiotics   Anti-infectives    Start      Dose/Rate Route Frequency Ordered Stop   07/06/17 2200  vancomycin (VANCOCIN) 1,250 mg in sodium chloride 0.9 % 250 mL IVPB     1,250 mg 166.7 mL/hr over 90 Minutes Intravenous Every 24 hours 07/06/17 1020     07/06/17 1000  vancomycin (VANCOCIN) IVPB 1000 mg/200 mL premix  Status:  Discontinued     1,000 mg 200 mL/hr over 60 Minutes Intravenous Every 12 hours 2017-07-31 2138 07/06/17 1017   07/06/17 1000  fluconazole (DIFLUCAN) tablet 100 mg     100 mg Oral Daily 07-31-17 2319 07/09/17 0959   2017/07/31 2200  vancomycin (VANCOCIN) 1,500 mg in sodium chloride 0.9 % 500 mL IVPB  Status:  Discontinued     1,500 mg 250 mL/hr over 120 Minutes Intravenous  Once  01-04-17 2135 07/06/17 1017   01-04-17 2145  cefTRIAXone (ROCEPHIN) 1 g in dextrose 5 % 50 mL IVPB     1 g 100 mL/hr over 30 Minutes Intravenous Every 24 hours 01-04-17 2144          Subjective:   Garrett Bradley was seen and examined today. Spiking low-grade fevers 99.77F. Difficult to obtain review of systems from the patient.   Objective:   Vitals:   07/09/17 0241 07/09/17 0310 07/09/17 0341 07/09/17 0816  BP: (!) 143/106 (!) 176/125 (!) 167/99 137/81  Pulse: 80  61 (!) 106  Resp: (!) 21  (!) 25 (!) 24  Temp: 99.5 F (37.5 C)  99.8 F (37.7 C) 99.5 F (37.5 C)  TempSrc: Oral  Oral Axillary  SpO2: 95%  96% 98%  Weight:      Height:        Intake/Output Summary (Last 24 hours) at 07/09/17 1218 Last data filed at 07/09/17 0343  Gross per 24 hour  Intake              300 ml  Output             1000 ml  Net             -700 ml     Wt Readings from Last 3 Encounters:  01-04-17 104.3 kg (230 lb)  11/26/16 106.1 kg (234 lb)  11/07/16 106.1 kg (234 lb)     Exam   General: Alert and awake, says yes or no, one word answers, more alert today   Eyes: PERRLA, EOMI, Anicteric Sclera,  HEENT:  Atraumatic, normocephalic, normal oropharynx  Cardiovascular: S1 S2 auscultated, irregular, No pedal edema  b/l  Respiratory: Clear to auscultation bilaterally, no wheezing, rales or rhonchi  Gastrointestinal: Soft, nontender, nondistended, + bowel sounds  Ext: no pedal edema bilaterally  Neuro: not following commands   Musculoskeletal: No digital cyanosis, clubbing  Skin: Bilateral lower ext wrapped   Psych: confused     Data Reviewed:  I have personally reviewed following labs and imaging studies  Micro Results Recent Results (from the past 240 hour(s))  Urine culture     Status: Abnormal   Collection Time: 01-04-17  2:39 PM  Result Value Ref Range Status   Specimen Description URINE, RANDOM  Final   Special Requests NONE  Final   Culture (A)  Final    >=100,000 COLONIES/mL METHICILLIN RESISTANT STAPHYLOCOCCUS AUREUS   Report Status 07/08/2017 FINAL  Final   Organism ID, Bacteria METHICILLIN RESISTANT STAPHYLOCOCCUS AUREUS (A)  Final      Susceptibility   Methicillin resistant staphylococcus aureus - MIC*    CIPROFLOXACIN >=8 RESISTANT Resistant     GENTAMICIN <=0.5 SENSITIVE Sensitive     NITROFURANTOIN <=16 SENSITIVE Sensitive     OXACILLIN >=4 RESISTANT Resistant     TETRACYCLINE <=1 SENSITIVE Sensitive     VANCOMYCIN 1 SENSITIVE Sensitive     TRIMETH/SULFA <=10 SENSITIVE Sensitive     CLINDAMYCIN <=0.25 SENSITIVE Sensitive     RIFAMPIN <=0.5 SENSITIVE Sensitive     Inducible Clindamycin NEGATIVE Sensitive     * >=100,000 COLONIES/mL METHICILLIN RESISTANT STAPHYLOCOCCUS AUREUS  MRSA PCR Screening     Status: Abnormal   Collection Time: 07/06/17  1:00 AM  Result Value Ref Range Status   MRSA by PCR POSITIVE (A) NEGATIVE Final    Comment:        The GeneXpert MRSA Assay (FDA approved for NASAL specimens only), is one  component of a comprehensive MRSA colonization surveillance program. It is not intended to diagnose MRSA infection nor to guide or monitor treatment for MRSA infections. RESULT CALLED TO, READ BACK BY AND VERIFIED WITH: M. Lucretia Roers [phone] 07.31.2018 N.  MORRIS     Radiology Reports Dg Chest 1 View  Result Date: 06/22/2017 CLINICAL DATA:  Mental status change, incontinence. EXAM: CHEST 1 VIEW COMPARISON:  Chest x-ray of March 16, 2016 FINDINGS: The lungs are adequately inflated. The interstitial markings are mildly increased. There is no alveolar infiltrate or pleural effusion. The heart is top-normal in size. The central pulmonary vascularity is mildly prominent. There are degenerative changes of both shoulders. IMPRESSION: The findings suggest low-grade CHF.  There is no alveolar pneumonia. Electronically Signed   By: David  [place] M.D.   On: 06/18/2017 15:41   Ct Head Wo Contrast  Result Date: 06/07/2017 CLINICAL DATA:  Confusion with incontinence EXAM: CT HEAD WITHOUT CONTRAST TECHNIQUE: Contiguous axial images were obtained from the base of the skull through the vertex without intravenous contrast. COMPARISON:  July 26, 2014 FINDINGS: Brain: There is mild diffuse atrophy. There is a recent appearing infarct in the medial left occipital lobe with decreased attenuation. There is sulcal effacement in the posterior left parietal lobe, potentially a second focus of early infarct. There is no edema in this area appreciable. There is no mass, hemorrhage, or extra-axial fluid collection. No midline shift. Elsewhere, there is slight small vessel disease in the centra semiovale bilaterally. Vascular: There is subtle increased attenuation in the proximal left middle cerebral artery compared to prior study. This appearance may represent early hyperdense vessel and may be indicative of early acute infarct in the left middle cerebral artery distribution. There is calcification in each carotid siphon region. Skull: Bony calvarium appears intact. Sinuses/Orbits: Visualized paranasal sinuses are clear. Visualized orbits appear symmetric bilaterally. Other: Mastoid air cells are clear. IMPRESSION: Recent appearing infarct in the left medial occipital lobe. Suspect  early acute infarct in the left parietal lobe with questionable early hyperdense vessel change in the left middle cerebral artery. Given these findings, correlation with MR pre and post-contrast advised to further evaluate. No mass or hemorrhage evident. No midline shift. Foci of calcification noted in each carotid siphon. Electronically Signed   By: Bretta Bang III M.D.   On: 06/11/2017 16:04   Mr Brain Wo Contrast (neuro Protocol)  Result Date: 06/15/2017 CLINICAL DATA:  Altered mental status and weakness EXAM: MRI HEAD WITHOUT CONTRAST MRA HEAD WITHOUT CONTRAST TECHNIQUE: Multiplanar, multiecho pulse sequences of the brain and surrounding structures were obtained without intravenous contrast. Angiographic images of the head were obtained using MRA technique without contrast. COMPARISON:  None. FINDINGS: MRI HEAD FINDINGS Brain: The midline structures are normal. There is a large area of diffusion restriction within the left posterior cerebral artery distribution. There are scattered other small foci of diffusion restriction within both cerebellar hemispheres, the left thalamus and anterior temporal lobe and multiple sites in the left parietal lobe and both frontal lobes. There is multifocal hyperintense T2-weighted signal within the periventricular white matter, most often seen in the setting of chronic microvascular ischemia. No mass lesion. There is petechial hemorrhage throughout the left PCA distribution. No hydrocephalus, age advanced atrophy or lobar predominant volume loss. No dural abnormality or extra-axial collection. Skull and upper cervical spine: The visualized skull base, calvarium, upper cervical spine and extracranial soft tissues are normal. Sinuses/Orbits: No fluid levels or advanced mucosal thickening. No mastoid effusion. Normal orbits. MRA HEAD  FINDINGS Intracranial internal carotid arteries: Normal. Anterior cerebral arteries: Normal. Middle cerebral arteries: Normal. Posterior  communicating arteries: Present on the right. Posterior cerebral arteries: The left PCA is occluded in its mid P2 segment. The right PCA is normal. Basilar artery: Normal. Vertebral arteries: Codominant Normal. Superior cerebellar arteries: Normal. Anterior inferior cerebellar arteries: Normal. Posterior inferior cerebellar arteries: Normal. IMPRESSION: 1. Left PCA territory acute infarct with associated diffuse petechial hemorrhage throughout the infarct site, without mass effect. 2. Occlusion of left posterior cerebral artery at the mid P2 segment. 3. Scattered subcentimeter foci of acute ischemia within both cerebellar hemispheres, left thalamus, left anterior temporal lobe, left parietal lobe and both frontal lobes. The distribution suggests a central cardioembolic process. Electronically Signed   By: Deatra RobinsonKevin  Herman M.D.   On: 06/08/2017 17:46   Mr Maxine GlennMra Head (cerebral Arteries)  Result Date: 07/04/2017 CLINICAL DATA:  Altered mental status and weakness EXAM: MRI HEAD WITHOUT CONTRAST MRA HEAD WITHOUT CONTRAST TECHNIQUE: Multiplanar, multiecho pulse sequences of the brain and surrounding structures were obtained without intravenous contrast. Angiographic images of the head were obtained using MRA technique without contrast. COMPARISON:  None. FINDINGS: MRI HEAD FINDINGS Brain: The midline structures are normal. There is a large area of diffusion restriction within the left posterior cerebral artery distribution. There are scattered other small foci of diffusion restriction within both cerebellar hemispheres, the left thalamus and anterior temporal lobe and multiple sites in the left parietal lobe and both frontal lobes. There is multifocal hyperintense T2-weighted signal within the periventricular white matter, most often seen in the setting of chronic microvascular ischemia. No mass lesion. There is petechial hemorrhage throughout the left PCA distribution. No hydrocephalus, age advanced atrophy or lobar  predominant volume loss. No dural abnormality or extra-axial collection. Skull and upper cervical spine: The visualized skull base, calvarium, upper cervical spine and extracranial soft tissues are normal. Sinuses/Orbits: No fluid levels or advanced mucosal thickening. No mastoid effusion. Normal orbits. MRA HEAD FINDINGS Intracranial internal carotid arteries: Normal. Anterior cerebral arteries: Normal. Middle cerebral arteries: Normal. Posterior communicating arteries: Present on the right. Posterior cerebral arteries: The left PCA is occluded in its mid P2 segment. The right PCA is normal. Basilar artery: Normal. Vertebral arteries: Codominant Normal. Superior cerebellar arteries: Normal. Anterior inferior cerebellar arteries: Normal. Posterior inferior cerebellar arteries: Normal. IMPRESSION: 1. Left PCA territory acute infarct with associated diffuse petechial hemorrhage throughout the infarct site, without mass effect. 2. Occlusion of left posterior cerebral artery at the mid P2 segment. 3. Scattered subcentimeter foci of acute ischemia within both cerebellar hemispheres, left thalamus, left anterior temporal lobe, left parietal lobe and both frontal lobes. The distribution suggests a central cardioembolic process. Electronically Signed   By: Deatra RobinsonKevin  Herman M.D.   On: 06/26/2017 17:46    Lab Data:  CBC:  Recent Labs Lab 06/09/2017 1605 07/07/17 0335  WBC 10.2 8.3  NEUTROABS 8.0*  --   HGB 10.3* 8.5*  HCT 31.9* 26.6*  MCV 88.4 86.1  PLT 134* 100*   Basic Metabolic Panel:  Recent Labs Lab 07/01/2017 1605 07/07/17 0335 07/09/17 0335  NA 140 138 139  K 3.2* 3.0* 3.6  CL 99* 104 105  CO2 31 29 26   GLUCOSE 198* 67 174*  BUN 31* 20 17  CREATININE 1.31* 1.21 1.43*  CALCIUM 8.5* 7.7* 8.0*   GFR: Estimated Creatinine Clearance: 53.8 mL/min (A) (by C-G formula based on SCr of 1.43 mg/dL (H)). Liver Function Tests:  Recent Labs Lab 06/06/2017 1605  AST 29  ALT 17  ALKPHOS 64  BILITOT  0.9  PROT 9.3*  ALBUMIN 2.4*   No results for input(s): LIPASE, AMYLASE in the last 168 hours. No results for input(s): AMMONIA in the last 168 hours. Coagulation Profile:  Recent Labs Lab 07/06/17 0146 07/07/17 0335 07/09/17 0335  INR 1.49 1.54 1.64   Cardiac Enzymes:  Recent Labs Lab 08-04-17 1605 07/06/17 0146 07/06/17 0830 07/06/17 1337 07/06/17 1846  CKTOTAL  --   --   --   --  41*  TROPONINI 0.34* 0.39* 0.44* 0.48*  --    BNP (last 3 results) No results for input(s): PROBNP in the last 8760 hours. HbA1C: No results for input(s): HGBA1C in the last 72 hours. CBG:  Recent Labs Lab 07/08/17 1207 07/08/17 1609 07/08/17 1951 07/08/17 2121 07/09/17 0814  GLUCAP 134* 150* 182* 195* 136*   Lipid Profile: No results for input(s): CHOL, HDL, LDLCALC, TRIG, CHOLHDL, LDLDIRECT in the last 72 hours. Thyroid Function Tests: No results for input(s): TSH, T4TOTAL, FREET4, T3FREE, THYROIDAB in the last 72 hours. Anemia Panel: No results for input(s): VITAMINB12, FOLATE, FERRITIN, TIBC, IRON, RETICCTPCT in the last 72 hours. Urine analysis:    Component Value Date/Time   COLORURINE YELLOW August 04, 2017 1439   APPEARANCEUR CLOUDY (A) Aug 04, 2017 1439   LABSPEC 1.015 Aug 04, 2017 1439   PHURINE 6.0 08-04-2017 1439   GLUCOSEU 50 (A) 2017/08/04 1439   HGBUR LARGE (A) 08/04/2017 1439   BILIRUBINUR NEGATIVE August 04, 2017 1439   KETONESUR NEGATIVE Aug 04, 2017 1439   PROTEINUR 100 (A) 08-04-17 1439   UROBILINOGEN 0.2 07/28/2014 0736   NITRITE NEGATIVE 2017-08-04 1439   LEUKOCYTESUR LARGE (A) 2017/08/04 1439     Miaya Lafontant M.D. [facility] 07/09/2017, 12:18 PM  Pager: [phone] Between 7am to 7pm - call Pager - [phone]  After 7pm go to [email or web address] - password [email or web address]  Call night coverage person covering after 7pm

## 2017-07-09 NOTE — Progress Notes (Signed)
Pharmacy Antibiotic Note  Garrett Bradley is a 83 y.o. male admitted on 06/19/2017 with UTI.  Pharmacy has been consulted for Vancomycin dosing. He has a history of MRSA in his urine cultures and has a chronic foley. His urine culture from this admission also has MRSA and his chronic foley was changed. His SCr is elevated but relatively stable.  Plan: Continue Vancomycin 1250mg  IV q24h Check Vancomycin trough before this evening's dose Daily BMET for now since SCr is elevated  Height: 6\' 4"  (193 cm) Weight: 230 lb (104.3 kg) IBW/kg (Calculated) : 86.8  Temp (24hrs), Avg:99.6 F (37.6 C), Min:99.3 F (37.4 C), Max:100 F (37.8 C)   Recent Labs Lab 06/28/2017 1605 07/04/2017 1917 07/06/17 0146 07/06/17 0428 07/07/17 0335 07/09/17 0335  WBC 10.2  --   --   --  8.3  --   CREATININE 1.31*  --   --   --  1.21 1.43*  LATICACIDVEN 2.1* 2.2* 1.1 0.9  --   --     Estimated Creatinine Clearance: 53.8 mL/min (A) (by C-G formula based on SCr of 1.43 mg/dL (H)).    Allergies  Allergen Reactions  . Thiazide-Type Diuretics Other (See Comments)    03/2016 acute wrist pain, presumed gout Uric acid 10.4    Antimicrobials this admission: Ceftriaxone 7/30>> Fluconazole 7/31>>(8/2) Vanc 7/30>>  Vanc load given 7/30 at 2300 (infused while he transferred from [facility])  7/30 Urine >100K MRSA 7/31 MRSA PCR positive  Thank you for allowing pharmacy to be a part of this patient's care.  Sallee Provencalurner, Danyel Tobey S 07/09/2017 10:54 AM

## 2017-07-10 LAB — BASIC METABOLIC PANEL
ANION GAP: 6 (ref 5–15)
BUN: 22 mg/dL — ABNORMAL HIGH (ref 6–20)
CALCIUM: 7.7 mg/dL — AB (ref 8.9–10.3)
CO2: 26 mmol/L (ref 22–32)
CREATININE: 1.61 mg/dL — AB (ref 0.61–1.24)
Chloride: 105 mmol/L (ref 101–111)
GFR, EST AFRICAN AMERICAN: 45 mL/min — AB (ref >60)
GFR, EST NON AFRICAN AMERICAN: 38 mL/min — AB (ref >60)
Glucose, Bld: 197 mg/dL — ABNORMAL HIGH (ref 65–99)
Potassium: 3.3 mmol/L — ABNORMAL LOW (ref 3.5–5.1)
SODIUM: 137 mmol/L (ref 135–145)

## 2017-07-10 LAB — HEPATIC FUNCTION PANEL
ALBUMIN: 1.6 g/dL — AB (ref 3.5–5.0)
ALT: 25 U/L (ref 17–63)
AST: 80 U/L — ABNORMAL HIGH (ref 15–41)
Alkaline Phosphatase: 68 U/L (ref 38–126)
BILIRUBIN INDIRECT: 0.5 mg/dL (ref 0.3–0.9)
Bilirubin, Direct: 0.2 mg/dL (ref 0.1–0.5)
TOTAL PROTEIN: 7.8 g/dL (ref 6.5–8.1)
Total Bilirubin: 0.7 mg/dL (ref 0.3–1.2)

## 2017-07-10 LAB — PROTIME-INR
INR: 1.7
INR: 1.56
PROTHROMBIN TIME: 20.2 s — AB (ref 11.4–15.2)
Prothrombin Time: 18.9 seconds — ABNORMAL HIGH (ref 11.4–15.2)

## 2017-07-10 LAB — APTT: APTT: 38 s — AB (ref 24–36)

## 2017-07-10 LAB — GLUCOSE, CAPILLARY
Glucose-Capillary: 212 mg/dL — ABNORMAL HIGH (ref 65–99)
Glucose-Capillary: 197 mg/dL — ABNORMAL HIGH (ref 65–99)
Glucose-Capillary: 152 mg/dL — ABNORMAL HIGH (ref 65–99)
Glucose-Capillary: 138 mg/dL — ABNORMAL HIGH (ref 65–99)

## 2017-07-10 LAB — AMMONIA: AMMONIA: 17 umol/L (ref 9–35)

## 2017-07-10 NOTE — Progress Notes (Signed)
Pharmacy Antibiotic Note  Garrett Bradley is a 83 y.o. male who presented on 7/30 with AMS and was found to have new CVAs. The patient was found to have a MRSA UTI in the setting of a chronic foley. Pharmacy is on board for Vancomycin dosing.   The patient's Vancomycin trough on 8/3 was found to be SUPRAtherapeutic at 22 mcg/ml and the dose was reduced from 1250 mg/24h to 1000 mg/24h. The patient's renal function has continued to worsen - SCr 1.61 << 1.43, CrCl~35-40 ml/min (normalized). Given this and targeting dosing for UTI - will reduce the dose slightly again today.   Plan: 1. Reduce Vancomycin to 750 mg IV every 24 hours 2. Will continue to follow renal function, culture results, LOT, and antibiotic de-escalation plans   Height: 6\' 4"  (193 cm) Weight: 230 lb (104.3 kg) IBW/kg (Calculated) : 86.8  Temp (24hrs), Avg:99.1 F (37.3 C), Min:98 F (36.7 C), Max:99.7 F (37.6 C)   Recent Labs Lab 06/25/2017 1605 06/25/2017 1917 07/06/17 0146 07/06/17 0428 07/07/17 0335 07/09/17 0335 07/09/17 2228 07/10/17 0329  WBC 10.2  --   --   --  8.3  --   --   --   CREATININE 1.31*  --   --   --  1.21 1.43*  --  1.61*  LATICACIDVEN 2.1* 2.2* 1.1 0.9  --   --   --   --   VANCOTROUGH  --   --   --   --   --   --  22*  --     Estimated Creatinine Clearance: 47.7 mL/min (A) (by C-G formula based on SCr of 1.61 mg/dL (H)).    Allergies  Allergen Reactions  . Thiazide-Type Diuretics Other (See Comments)    03/2016 acute wrist pain, presumed gout Uric acid 10.4    Thank you for allowing pharmacy to be a part of this patient's care.  Georgina PillionElizabeth Javione Gunawan, PharmD, BCPS Clinical Pharmacist Pager: [phone] Clinical phone for 07/10/2017 from 7a-3:30p: [phone] If after 3:30p, please call main pharmacy at: [phone] 07/10/2017 12:35 PM

## 2017-07-10 NOTE — Progress Notes (Signed)
Daughter Maxine GlennMonica called me in the room and stated that the family has decided for patient to be no resuscitation. Conversation witnessed by Press photographerCharge nurse. MD made aware

## 2017-07-10 NOTE — Progress Notes (Signed)
[facility]                                                                              Patient Demographics  Garrett Bradley, is a 83 y.o. male, DOB - 1934/05/05, RUE:[id number]  Admit date - 2017-07-19   Admitting Physician [name], MD  Outpatient Primary MD for the patient is Assunta Found, MD  Outpatient specialists:   LOS - 5  days   Medical records reviewed and are as summarized below:    Chief Complaint  Patient presents with  . Failure To Thrive       Brief summary   Patient is a 83 year old male with hypothyroidism, hypertension, BPH, diabetes presented due to confusion, acute encephalopathy. Family had called EMS because patient did not answer the door. Per EMS patient was sitting in the chair, soiled in urine and stool. Patient had Hospital on the end on patient's arm dated December 2017 and med bottles were dated the same date and were empty. Patient reported that he had not been changed in 2 weeks. CT head on admission showed recent appearing infarct in the left medial occipital lobe, early acute infarct in the left parietal lobe. Patient was admitted for further workup.  Assessment & Plan    Principal Problem:   Acute CVA (cerebral vascular accident) (HCC) - CT head showed recent appearing infarct in the left medial occipital lobe, early acute infarct in the left parietal lobe,  early hypodense vessel change in the left middle cerebral artery - MRI showed a left PCA territory acute infarct with associated diffuse petechial hemorrhage throughout the infarct site. Occlusion of the left posterior cerebral artery at the mid P2 segment. Scattered subcentimeter foci of acute ischemia within both cerebral hemispheres, left thalamus and left anterior temporal lobe, parietal lobe and both frontal lobes suggesting central cardioembolic process - 2-D echo showed EF 35-40%, moderate diffuse hypokinesis, intraventricular conduction delay  - Carotid  Doppler showed no significant ICA stenosis - Neurology was consulted, recommended aspirin and avoiding and evaluation due to petechial hemorrhage - LDL 62, continue aspirin, on Lipitor 20 mg daily  - Cardiology was consulted, patient seen by Dr Elease Hashimoto, per recommendations ECG showed atrial fibrillation, occasional normal sinus rhythm, had stroke and will eventually need to be started on oral anticoagulation.  - Per neurology, continue aspirin 325 mg for now. Due to Atrial fibrillation, recommend anticoagulation for stroke prevention in 7 days due to moderate-sized infarct and petechial hemorrhage. Once [facility] started aspirin can be discontinued. D/w Dr Roda Shutters, neurology recommended eliquis.  However if INR remains high, Coumadin will be better choice.  - Yesterday evening on 8/3, patient noted to be having weakness more in the left upper arm, CT head was repeated which showed acute hemorrhagic infarct in the left occipital lobe, new areas of acute infarction in the cerebellum bilaterally and in the right occipital lobe. Patient noted to be more lethargic. - Discussed in detail with the patient's family, stroke workup recently completed, requested neurology, Dr Nicholas Lose on stroke team for follow-up   Active Problems:    Mildly elevated troponins - Likely secondary to above, cardiology consulted, per  cardiology likely due to demand ischemia - 2-D echo showed EF of 35-40% with moderate diffuse hypokinesis - No invasive workup recommended by cardiology at this time  Acute Systolic CHF - 2-D echo showed EF of 35-40%, cardiology consulted - Per cardiology poor candidate for invasive cardiac procedures, will treat with standard medical management for CHF    Failure to thrive (adult) - Patient reportedly found in feces and urine with Hospital bracelet from December 2017, his medications were not filled since December  MRSA  UTI -  patient also had indwelling Foley catheter on admission which had not been  changed - Urine culture showing more than 100,000 colonies of staph aureus - Continue vancomycin, discontinued IV Rocephin  Essential hypertension - Allow for permissive hypertension given the acute stroke - will continue Cardizem for hypertension at discharge   Diabetes mellitus type 2 with hypoglycemia - Placed on D5 drip, hold long-acting insulin -Hemoglobin A1c 8.0   Hypothyroidism - TSH 6.2, continue Synthroid. Unclear if patient was taking his medications, does not appear to have filled after December 2017 - Synthroid restarted, repeat thyroid testing in 4-6 weeks   Moderate malnutrition - Nutrition consulted, continue supplementation  Acute kidney injury -Likely due to poor oral intake, increased IV fluids - Palliative goals of care pending   Thrush - continue Diflucan  Chronic normocytic anemia - Hemoglobin appears to be at baseline  Goals of care - Overall poor prognosis, mental status has not significantly improved, poor oral intake. New left sided weakness with CT head showing acute stroke. Discussed in detail with the patient's family, daughter, son, granddaughter at the bedside. CODE STATUS, artificial feeding discussed with the family. Family requested CODE STATUS to be changed to DO NOT RESUSCITATE. Palliative goals of care meeting pending tomorrow. Family is leaning towards comfort care  Code Status:  DNR  DVT Prophylaxis:   SCD's Family Communication: Son, daughter, granddaughter at the bedside  Disposition Plan: Will await palliative care meeting tomorrow  Time Spent in minutes   25 minutes  Procedures:  MRI/MRA brain   2-D echo  Consultants:   Neurology  Palliative care  Antimicrobials:   IV vancomycin   Medications  Scheduled Meds: . aspirin  300 mg Rectal Daily   Or  . aspirin  325 mg Oral Daily  . atorvastatin  20 mg Oral q1800  . Chlorhexidine Gluconate Cloth  6 each Topical Q0600  . citalopram  20 mg Oral Daily  . feeding  supplement (ENSURE ENLIVE)  237 mL Oral BID BM  . insulin aspart  0-5 Units Subcutaneous QHS  . insulin aspart  0-9 Units Subcutaneous TID WC  . latanoprost  1 drop Both Eyes QHS  . levothyroxine  137 mcg Oral QAC breakfast  . mouth rinse  15 mL Mouth Rinse BID  . mupirocin ointment  1 application Nasal BID  . senna  1 tablet Oral Daily  . silver sulfADIAZINE   Topical Daily  . tamsulosin  0.4 mg Oral QPC supper   Continuous Infusions: . dextrose 5 % and 0.45% NaCl 75 mL/hr at 07/10/17 0220  . vancomycin Stopped (07/10/17 0624)   PRN Meds:.acetaminophen **OR** acetaminophen (TYLENOL) oral liquid 160 mg/5 mL **OR** acetaminophen, ALPRAZolam, hydrALAZINE, senna-docusate   Antibiotics   Anti-infectives    Start     Dose/Rate Route Frequency Ordered Stop   07/10/17 0600  vancomycin (VANCOCIN) IVPB 1000 mg/200 mL premix     1,000 mg 200 mL/hr over 60 Minutes Intravenous Every 24 hours  07/09/17 2332     07/06/17 2200  vancomycin (VANCOCIN) 1,250 mg in sodium chloride 0.9 % 250 mL IVPB  Status:  Discontinued     1,250 mg 166.7 mL/hr over 90 Minutes Intravenous Every 24 hours 07/06/17 1020 07/09/17 2329   07/06/17 1000  vancomycin (VANCOCIN) IVPB 1000 mg/200 mL premix  Status:  Discontinued     1,000 mg 200 mL/hr over 60 Minutes Intravenous Every 12 hours 06/29/2017 2138 07/06/17 1017   07/06/17 1000  fluconazole (DIFLUCAN) tablet 100 mg     100 mg Oral Daily 06/26/2017 2319 07/09/17 0959   06/26/2017 2200  vancomycin (VANCOCIN) 1,500 mg in sodium chloride 0.9 % 500 mL IVPB  Status:  Discontinued     1,500 mg 250 mL/hr over 120 Minutes Intravenous  Once 06/22/2017 2135 07/06/17 1017   06/06/2017 2145  cefTRIAXone (ROCEPHIN) 1 g in dextrose 5 % 50 mL IVPB  Status:  Discontinued     1 g 100 mL/hr over 30 Minutes Intravenous Every 24 hours 06/16/2017 2144 07/09/17 1224        Subjective:   Garrett Bradley was seen and examined today. Patient somnolent, lethargic, unable to obtain any review  of system from the patient, has left-sided weakness from yesterday. Not eating much, family at the bedside. Low-grade temp 99.5 and he this morning.  Objective:   Vitals:   07/10/17 0422 07/10/17 0851 07/10/17 0900 07/10/17 1234  BP: 117/71 129/69 (!) 110/58 (!) 153/90  Pulse: 97 (!) 55  91  Resp: (!) 21 (!) 23 (!) 22 (!) 24  Temp: 99 F (37.2 C) 99.5 F (37.5 C)  97.7 F (36.5 C)  TempSrc: Oral Axillary  Oral  SpO2: 93% 100%  95%  Weight:      Height:        Intake/Output Summary (Last 24 hours) at 07/10/17 1408 Last data filed at 07/10/17 1235  Gross per 24 hour  Intake             1625 ml  Output              800 ml  Net              825 ml     Wt Readings from Last 3 Encounters:  06/25/2017 104.3 kg (230 lb)  11/26/16 106.1 kg (234 lb)  11/07/16 106.1 kg (234 lb)     Exam  General: Somnolent, lethargic  Eyes:   HEENT:    Cardiovascular: S1 S2 auscultated, no rubs, murmurs or gallops. Regular rate and rhythm.   Respiratory: Clear to auscultation bilaterally, no wheezing, rales or rhonchi  Gastrointestinal: Soft, nontender, nondistended, + bowel sounds  Ext: bilateral lower extremity wrapped  Neuro: does not cooperate with examination, new left-sided weakness from yesterday  Musculoskeletal: No digital cyanosis, clubbing  Skin: No rashes  Psych: lethargic, somnolent    Data Reviewed:  I have personally reviewed following labs and imaging studies  Micro Results Recent Results (from the past 240 hour(s))  Urine culture     Status: Abnormal   Collection Time: 06/08/2017  2:39 PM  Result Value Ref Range Status   Specimen Description URINE, RANDOM  Final   Special Requests NONE  Final   Culture (A)  Final    >=100,000 COLONIES/mL METHICILLIN RESISTANT STAPHYLOCOCCUS AUREUS   Report Status 07/08/2017 FINAL  Final   Organism ID, Bacteria METHICILLIN RESISTANT STAPHYLOCOCCUS AUREUS (A)  Final      Susceptibility   Methicillin resistant staphylococcus  aureus - MIC*    CIPROFLOXACIN >=8 RESISTANT Resistant     GENTAMICIN <=0.5 SENSITIVE Sensitive     NITROFURANTOIN <=16 SENSITIVE Sensitive     OXACILLIN >=4 RESISTANT Resistant     TETRACYCLINE <=1 SENSITIVE Sensitive     VANCOMYCIN 1 SENSITIVE Sensitive     TRIMETH/SULFA <=10 SENSITIVE Sensitive     CLINDAMYCIN <=0.25 SENSITIVE Sensitive     RIFAMPIN <=0.5 SENSITIVE Sensitive     Inducible Clindamycin NEGATIVE Sensitive     * >=100,000 COLONIES/mL METHICILLIN RESISTANT STAPHYLOCOCCUS AUREUS  MRSA PCR Screening     Status: Abnormal   Collection Time: 07/06/17  1:00 AM  Result Value Ref Range Status   MRSA by PCR POSITIVE (A) NEGATIVE Final    Comment:        The GeneXpert MRSA Assay (FDA approved for NASAL specimens only), is one component of a comprehensive MRSA colonization surveillance program. It is not intended to diagnose MRSA infection nor to guide or monitor treatment for MRSA infections. RESULT CALLED TO, READ BACK BY AND VERIFIED WITH: M. Lucretia Roers [phone] 07.31.2018 N. MORRIS     Radiology Reports Dg Chest 1 View  Result Date: July 30, 2017 CLINICAL DATA:  Mental status change, incontinence. EXAM: CHEST 1 VIEW COMPARISON:  Chest x-ray of March 16, 2016 FINDINGS: The lungs are adequately inflated. The interstitial markings are mildly increased. There is no alveolar infiltrate or pleural effusion. The heart is top-normal in size. The central pulmonary vascularity is mildly prominent. There are degenerative changes of both shoulders. IMPRESSION: The findings suggest low-grade CHF.  There is no alveolar pneumonia. Electronically Signed   By: David  [place] M.D.   On: July 30, 2017 15:41   Ct Head Wo Contrast  Result Date: 07/09/2017 CLINICAL DATA:  Stroke followup EXAM: CT HEAD WITHOUT CONTRAST TECHNIQUE: Contiguous axial images were obtained from the base of the skull through the vertex without intravenous contrast. COMPARISON:  CT and MRI 2017/07/30 FINDINGS: Brain: Acute infarct left  posterior cerebral artery territory involving the left occipital lobe and posterior thalamus. There is mild amount of hemorrhage within the infarct, similar to the prior CT and MRI. New areas of hypodensity in the cerebellum bilaterally compatible with acute infarct. No hemorrhage in the posterior fossa. New area of hypodensity right occipital lobe compatible with acute infarct without hemorrhage. Generalized atrophy. Negative for hydrocephalus. Chronic white matter disease. Chronic left frontal infarct. No shift of the midline structures. Vascular: Negative for hyperdense vessel Skull: Negative Sinuses/Orbits: Negative Other: None IMPRESSION: Acute hemorrhagic infarct in the left occipital lobe similar to prior CT and MRI New areas of acute infarct in the cerebellum bilaterally and in the right occipital lobe. Findings consistent with continued posterior circulation emboli These results will be called to the ordering clinician or representative by the Radiologist Assistant, and communication documented in the PACS or zVision Dashboard. Electronically Signed   By: Marlan Palau M.D.   On: 07/09/2017 16:50   Ct Head Wo Contrast  Result Date: Jul 30, 2017 CLINICAL DATA:  Confusion with incontinence EXAM: CT HEAD WITHOUT CONTRAST TECHNIQUE: Contiguous axial images were obtained from the base of the skull through the vertex without intravenous contrast. COMPARISON:  July 26, 2014 FINDINGS: Brain: There is mild diffuse atrophy. There is a recent appearing infarct in the medial left occipital lobe with decreased attenuation. There is sulcal effacement in the posterior left parietal lobe, potentially a second focus of early infarct. There is no edema in this area appreciable. There is no mass, hemorrhage, or extra-axial  fluid collection. No midline shift. Elsewhere, there is slight small vessel disease in the centra semiovale bilaterally. Vascular: There is subtle increased attenuation in the proximal left middle  cerebral artery compared to prior study. This appearance may represent early hyperdense vessel and may be indicative of early acute infarct in the left middle cerebral artery distribution. There is calcification in each carotid siphon region. Skull: Bony calvarium appears intact. Sinuses/Orbits: Visualized paranasal sinuses are clear. Visualized orbits appear symmetric bilaterally. Other: Mastoid air cells are clear. IMPRESSION: Recent appearing infarct in the left medial occipital lobe. Suspect early acute infarct in the left parietal lobe with questionable early hyperdense vessel change in the left middle cerebral artery. Given these findings, correlation with MR pre and post-contrast advised to further evaluate. No mass or hemorrhage evident. No midline shift. Foci of calcification noted in each carotid siphon. Electronically Signed   By: Bretta BangWilliam  Woodruff III M.D.   On: 06/09/2017 16:04   Mr Brain Wo Contrast (neuro Protocol)  Result Date: 06/27/2017 CLINICAL DATA:  Altered mental status and weakness EXAM: MRI HEAD WITHOUT CONTRAST MRA HEAD WITHOUT CONTRAST TECHNIQUE: Multiplanar, multiecho pulse sequences of the brain and surrounding structures were obtained without intravenous contrast. Angiographic images of the head were obtained using MRA technique without contrast. COMPARISON:  None. FINDINGS: MRI HEAD FINDINGS Brain: The midline structures are normal. There is a large area of diffusion restriction within the left posterior cerebral artery distribution. There are scattered other small foci of diffusion restriction within both cerebellar hemispheres, the left thalamus and anterior temporal lobe and multiple sites in the left parietal lobe and both frontal lobes. There is multifocal hyperintense T2-weighted signal within the periventricular white matter, most often seen in the setting of chronic microvascular ischemia. No mass lesion. There is petechial hemorrhage throughout the left PCA distribution. No  hydrocephalus, age advanced atrophy or lobar predominant volume loss. No dural abnormality or extra-axial collection. Skull and upper cervical spine: The visualized skull base, calvarium, upper cervical spine and extracranial soft tissues are normal. Sinuses/Orbits: No fluid levels or advanced mucosal thickening. No mastoid effusion. Normal orbits. MRA HEAD FINDINGS Intracranial internal carotid arteries: Normal. Anterior cerebral arteries: Normal. Middle cerebral arteries: Normal. Posterior communicating arteries: Present on the right. Posterior cerebral arteries: The left PCA is occluded in its mid P2 segment. The right PCA is normal. Basilar artery: Normal. Vertebral arteries: Codominant Normal. Superior cerebellar arteries: Normal. Anterior inferior cerebellar arteries: Normal. Posterior inferior cerebellar arteries: Normal. IMPRESSION: 1. Left PCA territory acute infarct with associated diffuse petechial hemorrhage throughout the infarct site, without mass effect. 2. Occlusion of left posterior cerebral artery at the mid P2 segment. 3. Scattered subcentimeter foci of acute ischemia within both cerebellar hemispheres, left thalamus, left anterior temporal lobe, left parietal lobe and both frontal lobes. The distribution suggests a central cardioembolic process. Electronically Signed   By: Deatra RobinsonKevin  Herman M.D.   On: 06/30/2017 17:46   Mr Maxine GlennMra Head (cerebral Arteries)  Result Date: 06/19/2017 CLINICAL DATA:  Altered mental status and weakness EXAM: MRI HEAD WITHOUT CONTRAST MRA HEAD WITHOUT CONTRAST TECHNIQUE: Multiplanar, multiecho pulse sequences of the brain and surrounding structures were obtained without intravenous contrast. Angiographic images of the head were obtained using MRA technique without contrast. COMPARISON:  None. FINDINGS: MRI HEAD FINDINGS Brain: The midline structures are normal. There is a large area of diffusion restriction within the left posterior cerebral artery distribution. There are  scattered other small foci of diffusion restriction within both cerebellar hemispheres, the left thalamus  and anterior temporal lobe and multiple sites in the left parietal lobe and both frontal lobes. There is multifocal hyperintense T2-weighted signal within the periventricular white matter, most often seen in the setting of chronic microvascular ischemia. No mass lesion. There is petechial hemorrhage throughout the left PCA distribution. No hydrocephalus, age advanced atrophy or lobar predominant volume loss. No dural abnormality or extra-axial collection. Skull and upper cervical spine: The visualized skull base, calvarium, upper cervical spine and extracranial soft tissues are normal. Sinuses/Orbits: No fluid levels or advanced mucosal thickening. No mastoid effusion. Normal orbits. MRA HEAD FINDINGS Intracranial internal carotid arteries: Normal. Anterior cerebral arteries: Normal. Middle cerebral arteries: Normal. Posterior communicating arteries: Present on the right. Posterior cerebral arteries: The left PCA is occluded in its mid P2 segment. The right PCA is normal. Basilar artery: Normal. Vertebral arteries: Codominant Normal. Superior cerebellar arteries: Normal. Anterior inferior cerebellar arteries: Normal. Posterior inferior cerebellar arteries: Normal. IMPRESSION: 1. Left PCA territory acute infarct with associated diffuse petechial hemorrhage throughout the infarct site, without mass effect. 2. Occlusion of left posterior cerebral artery at the mid P2 segment. 3. Scattered subcentimeter foci of acute ischemia within both cerebellar hemispheres, left thalamus, left anterior temporal lobe, left parietal lobe and both frontal lobes. The distribution suggests a central cardioembolic process. Electronically Signed   By: Deatra Robinson M.D.   On: 06/18/2017 17:46    Lab Data:  CBC:  Recent Labs Lab 06/07/2017 1605 07/07/17 0335  WBC 10.2 8.3  NEUTROABS 8.0*  --   HGB 10.3* 8.5*  HCT 31.9* 26.6*   MCV 88.4 86.1  PLT 134* 100*   Basic Metabolic Panel:  Recent Labs Lab 07/02/2017 1605 07/07/17 0335 07/09/17 0335 07/10/17 0329  NA 140 138 139 137  K 3.2* 3.0* 3.6 3.3*  CL 99* 104 105 105  CO2 31 29 26 26   GLUCOSE 198* 67 174* 197*  BUN 31* 20 17 22*  CREATININE 1.31* 1.21 1.43* 1.61*  CALCIUM 8.5* 7.7* 8.0* 7.7*   GFR: Estimated Creatinine Clearance: 47.7 mL/min (A) (by C-G formula based on SCr of 1.61 mg/dL (H)). Liver Function Tests:  Recent Labs Lab 06/11/2017 1605  AST 29  ALT 17  ALKPHOS 64  BILITOT 0.9  PROT 9.3*  ALBUMIN 2.4*   No results for input(s): LIPASE, AMYLASE in the last 168 hours. No results for input(s): AMMONIA in the last 168 hours. Coagulation Profile:  Recent Labs Lab 07/06/17 0146 07/07/17 0335 07/09/17 0335 07/10/17 0329  INR 1.49 1.54 1.64 1.70   Cardiac Enzymes:  Recent Labs Lab 06/16/2017 1605 07/06/17 0146 07/06/17 0830 07/06/17 1337 07/06/17 1846  CKTOTAL  --   --   --   --  41*  TROPONINI 0.34* 0.39* 0.44* 0.48*  --    BNP (last 3 results) No results for input(s): PROBNP in the last 8760 hours. HbA1C: No results for input(s): HGBA1C in the last 72 hours. CBG:  Recent Labs Lab 07/09/17 1243 07/09/17 1658 07/09/17 2107 07/10/17 0844 07/10/17 1208  GLUCAP 138* 157* 183* 212* 197*   Lipid Profile: No results for input(s): CHOL, HDL, LDLCALC, TRIG, CHOLHDL, LDLDIRECT in the last 72 hours. Thyroid Function Tests: No results for input(s): TSH, T4TOTAL, FREET4, T3FREE, THYROIDAB in the last 72 hours. Anemia Panel: No results for input(s): VITAMINB12, FOLATE, FERRITIN, TIBC, IRON, RETICCTPCT in the last 72 hours. Urine analysis:    Component Value Date/Time   COLORURINE YELLOW 06/24/2017 1439   APPEARANCEUR CLOUDY (A) 07/04/2017 1439   LABSPEC 1.015  Jul 06, 2017 1439   PHURINE 6.0 2017-07-06 1439   GLUCOSEU 50 (A) 07/06/2017 1439   HGBUR LARGE (A) 07/06/17 1439   BILIRUBINUR NEGATIVE Jul 06, 2017 1439    KETONESUR NEGATIVE 06-Jul-2017 1439   PROTEINUR 100 (A) 07/06/2017 1439   UROBILINOGEN 0.2 07/28/2014 0736   NITRITE NEGATIVE 06-Jul-2017 1439   LEUKOCYTESUR LARGE (A) 07-06-2017 1439     Timotheus Salm M.D. [facility] 07/10/2017, 2:08 PM  Pager: [phone] Between 7am to 7pm - call Pager - [phone]  After 7pm go to [email or web address] - password [email or web address]  Call night coverage person covering after 7pm

## 2017-07-10 NOTE — Progress Notes (Signed)
STROKE TEAM PROGRESS NOTE    Attending addendum:  Initially admitted 07/24/17 with acute left occipital, bilateral cerebellar, bilateral frontal , and left thalamic infarcts.  Due to mental status changes, repeat CT Brain done yesterday 07/09/2017 which showed at least a new right occipital infarct.  I do not see any obvious brainstem infarcts, but may be missed on CT easily.  There is some hemorrhagic conversion in the left occipital infarct since admission.  However, he was maintained on ASA and anticoagulation was deferred for presumed a. Fib cardioembolism.  His INR is elevated to 1.7 for some reason.  He has not had any investigation into liver issues.    His mental status is very poor and he is unresponsive.   The location of the known strokes should not cause this, although as mentioned above brainstem infarcts may do it and we are not seeing them well on CT.  I will defer MRI for now since it will not likely change our management.  Hepatic encephalopathy may be playing a part here given elevated INR.  I will check liver enzymes, repeat coags, and check NH3.  An EEG may be considered too if no answers from the labs.  I recommend holding ASA for now given hemorrhage into the ischemic infarct and elevated INR.    Family is considering possible palliative care so will not be too aggressive at this time.    Garrett Settle, MS, MD      History Garrett Bradley is a 83 y.o. male with a history of diabetes, hypertension who states that he has had some trouble with his vision for a couple of days. Apparently was found by his family to be confused. He was found disheveled, and soiled. He was brought into the emergency department where a head CT was performed which showed a hypodensity in the left occipital lobe. An MRI was therefore performed which shows a large left PCA territory infarct as well as multifocal other likely embolic infarcts. Telemetry showed Afib with RVR (new diagnosis)   LKW:  Unclear tpa given?: no, unclear time of onset   Dr Roda Shutters followed the patient earlier in his hospital course and signed off 07/07/2017. Neurology was asked to see the patient again today for increased left UE weakness that started last night. Head CT 07/09/2017 - Acute hemorrhagic infarct in the left occipital lobe similar to prior CT and MRI. New areas of acute infarct in the cerebellum bilaterally and in the right occipital lobe by CT 07/09/2017. Pt had been on ASA 325 mg daily with plans to anticoagulate secondary to new afib; however, INRs have been elevated - etiology uncertain - INR 1.70 today.   SUBJECTIVE (INTERVAL HISTORY) No family at bedside.  Patient is unconscious.     OBJECTIVE Temp:  [97.7 F (36.5 C)-99.7 F (37.6 C)] 97.7 F (36.5 C) (08/04 1234) Pulse Rate:  [55-101] 91 (08/04 1234) Cardiac Rhythm: Bundle branch block;Normal sinus rhythm (08/04 0810) Resp:  [21-25] 24 (08/04 1234) BP: (110-196)/(58-173) 153/90 (08/04 1234) SpO2:  [93 %-100 %] 95 % (08/04 1234)  CBC:   Recent Labs Lab 07-24-2017 1605 07/07/17 0335  WBC 10.2 8.3  NEUTROABS 8.0*  --   HGB 10.3* 8.5*  HCT 31.9* 26.6*  MCV 88.4 86.1  PLT 134* 100*    Basic Metabolic Panel:   Recent Labs Lab 07/09/17 0335 07/10/17 0329  NA 139 137  K 3.6 3.3*  CL 105 105  CO2 26 26  GLUCOSE 174* 197*  BUN 17 22*  CREATININE 1.43* 1.61*  CALCIUM 8.0* 7.7*    Lipid Panel:     Component Value Date/Time   CHOL 101 07/06/2017 0428   TRIG 69 07/06/2017 0428   HDL 25 (L) 07/06/2017 0428   CHOLHDL 4.0 07/06/2017 0428   VLDL 14 07/06/2017 0428   LDLCALC 62 07/06/2017 0428   HgbA1c:  Lab Results  Component Value Date   HGBA1C 8.0 (H) 07/06/2017   Urine Drug Screen:     Component Value Date/Time   LABOPIA NONE DETECTED 07/06/2017 1811   COCAINSCRNUR NONE DETECTED 07/06/2017 1811   LABBENZ NONE DETECTED 07/06/2017 1811   AMPHETMU NONE DETECTED 07/06/2017 1811   THCU NONE DETECTED 07/06/2017 1811    LABBARB NONE DETECTED 07/06/2017 1811    Alcohol Level     Component Value Date/Time   ETH <5 03/16/2016 1358    IMAGING   Dg Chest 1 View 10/31/17 The findings suggest low-grade CHF.  There is no alveolar pneumonia.    Ct Head Wo Contrast 10/31/17  Recent appearing infarct in the left medial occipital lobe. Suspect early acute infarct in the left parietal lobe with questionable early hyperdense vessel change in the left middle cerebral artery. Given these findings, correlation with MR pre and post-contrast advised to further evaluate. No mass or hemorrhage evident. No midline shift. Foci of calcification noted in each carotid siphon.   Ct Head Wo Contrast 07/09/2017  Acute hemorrhagic infarct in the left occipital lobe similar to prior CT and MRI New areas of acute infarct in the cerebellum bilaterally and in the right occipital lobe.  Findings consistent with continued posterior circulation emboli    Mr Brain Wo Contrast (neuro Protocol) Mr Shirlee LatchMra Head (cerebral Arteries) 10/31/17 IMPRESSION: 1. Left PCA territory acute infarct with associated diffuse petechial hemorrhage throughout the infarct site, without mass effect. 2. Occlusion of left posterior cerebral artery at the mid P2 segment. 3. Scattered subcentimeter foci of acute ischemia within both cerebellar hemispheres, left thalamus, left anterior temporal lobe, left parietal lobe and both frontal lobes. The distribution suggests a central cardioembolic process.   Cartoid US 07/06/2017 B ICA 1-39% stenosis, VAs antegrade  2D Echo 07/06/2017 - Left ventricle: The cavity size was normal. There was moderate   concentric hypertrophy. Systolic function was moderately reduced.   The estimated ejection fraction was in the range of 35% to 40%.   Moderate diffuse hypokinesis with no identifiable regional   variations. - Ventricular septum: Septal motion showed abnormal function,   dyssynergy, and paradox. These changes are  consistent with   intraventricular conduction delay. - Aortic valve: There was trivial regurgitation. - Mitral valve: There was mild regurgitation. - Left atrium: The atrium was mildly dilated. - Right atrium: The atrium was mildly dilated. - Pericardium, extracardiac: A trivial pericardial effusion was   identified. There was a left pleural effusion.   PHYSICAL EXAM  Temp:  [97.7 F (36.5 C)-99.7 F (37.6 C)] 97.7 F (36.5 C) (08/04 1234) Pulse Rate:  [55-101] 91 (08/04 1234) Resp:  [21-25] 24 (08/04 1234) BP: (110-196)/(58-173) 153/90 (08/04 1234) SpO2:  [93 %-100 %] 95 % (08/04 1234)  General - Well nourished, well developed, lethargic but arousable.  Ophthalmologic - Fundi not visualized due to noncooperation.  Cardiovascular - irregularly irregular heart rate and rhythm.  Neuro - Unconscious.  Eyes do not open to verbal, tactile, or painful stimuli.  No withdrawal to pain.  No reaction to sternal rub.  No neck stiffness.  ASSESSMENT/PLAN Garrett Bradley is a 83 y.o. male with history of diabetes, hypertension, hypothyroidism, and history of acute kidney injury who presented with approximately two days of vision problem for a couple of days. He did not receive IV t-PA due to unknown time LKW.   Stroke: multifocal infarcts bilateral anterior and posterior infarcts, cardioembolic pattern likely due to newly recognized afib.   Resultant  Right upper quadrantanopia, lethargy  CT head: Recent L medial occipital lobe infarct, acute L parietal infarct with questionable hyperdense L MCA.  MRI head:  L PCA infarcts with diffuse petechial hemorrhage, and scattered bilateral subcentimeter acute infarcts in L thalamus, L anterior temporal lobe, and L parietal lobe and both frontal lobes  MRA head:  L P2 occlusion  Carotid Doppler: B ICA 1-39% stenosis, VAs antegrade  2D Echo  EF 35-40%  LDL 62   HgbA1c 8.8  SCDs for VTE prophylaxis DIET - DYS 1 Room service  appropriate? Yes; Fluid consistency: Thin  No antithrombotic prior to admission, now on ASA 325mg . Due to afib, we recommend anticoagulation for stroke prevention in 7 days due to moderate sized infarct and petechial hemorrhage. Once anticoagulatoin started, ASA can be discontinued.  Ongoing aggressive stroke risk factor management  Therapy recommendations:  SNF recommended  Disposition:  Pending  afib newly recognized  EKG and tele showed afib  Cardiology on board and help appreciated  Initiate on ASA, will recommend anticoagulation in 7 days due to moderate sized infarct and petechial hemorrhage. Once anticoagulation started, ASA can be stopped from stroke standpoint.  MRSA UTI  UA showed WBC TNTC  Urine culture positive for MRSA  On rocephin and vanco  Hx of DVT on Xarelto  Hx of DVT in 03/2016  Was on Xarelto, but reported not on Xarelto PTA  INR was 1.49 -> 1.54 - etiology unclear, ?? Is pt taking Xarelto at home?? vs. Chronic elevation ??  Hypertension  Stable  Permissive hypertension (OK if < 220/120) but gradually normalize in 5-7 days  Long-term BP goal normotensive  Hyperlipidemia  Home meds:  none  LDL 62, goal < 70  Add atorvastatin 20mg  PO daily  Continue statin at discharge  Diabetes  HgbA1c 8.8, goal < 7.0  Uncontrolled  Hyperglycemia  On levemir  Likely not taking meds at home  Other Stroke Risk Factors  Advanced age  Other Active Problems  Elevated troponin: 0.3 ->0.39 ->0.44  Elevated Cre 1.31->1.61  Noncompliance   Neglect at home??  Anemia - 8.5 / 26.6  Hypokalemia - 3.3  Thrombocytopenia - 134 -> 100 K  Acute hemorrhagic infarct in the left occipital lobe by CT 07/09/2017 (on ASA 325 mg daily)  Elevated INRs 1.70 today  Hospital day # 5       To contact Stroke Continuity provider, please refer to [email or web address]. After hours, contact General Neurology

## 2017-07-11 DIAGNOSIS — Z7189 Other specified counseling: Secondary | ICD-10-CM

## 2017-07-11 LAB — GLUCOSE, CAPILLARY
GLUCOSE-CAPILLARY: 176 mg/dL — AB (ref 65–99)
GLUCOSE-CAPILLARY: 170 mg/dL — AB (ref 65–99)
Glucose-Capillary: 152 mg/dL — ABNORMAL HIGH (ref 65–99)
Glucose-Capillary: 138 mg/dL — ABNORMAL HIGH (ref 65–99)

## 2017-07-11 MED ORDER — VANCOMYCIN HCL IN DEXTROSE 750-5 MG/150ML-% IV SOLN
750.0000 mg | INTRAVENOUS | Status: DC
  Administered 2017-07-12 – 2017-07-14 (×3): 750 mg via INTRAVENOUS
  Filled 2017-07-11 (×4): qty 150

## 2017-07-11 MED ORDER — LEVOTHYROXINE SODIUM 100 MCG IV SOLR
100.0000 ug | Freq: Every day | INTRAVENOUS | Status: DC
  Administered 2017-07-12 – 2017-07-15 (×4): 100 ug via INTRAVENOUS
  Filled 2017-07-11 (×4): qty 5

## 2017-07-11 NOTE — Progress Notes (Addendum)
STROKE TEAM PROGRESS NOTE    Attending addendum:  Initially admitted 06/16/2017 with acute left occipital, bilateral cerebellar, bilateral frontal , and left thalamic infarcts.  Due to mental status changes, repeat CT Brain done yesterday 07/09/2017 which showed at least a new right occipital infarct.  I do not see any obvious brainstem infarcts, but may be missed on CT easily.  There is some hemorrhagic conversion in the left occipital infarct since admission.  He is off ASA.    His INR/PT/PTT are elevated despite no anticoagulation.  His AST is high but all other liver enzymes are normal.  His Ammonia level is normal.  His GFR is low at 45.  His mental status is very poor and he is unresponsive.  He does open his eyes slightly more vs yesterday.   The location of the known strokes should not cause this, although thalamic infarcts can cause a coma but usually when present bilaterally only.  I suspect he may have had dementia at baseline and one thalamic infarct caused a decompensation in his mental status.  He also has contributing liver and renal metabolic encephalopathy.  I will do an EEG to assess his cerebral activity.    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    Palliative care is on board and we will see what the family wants to do soon.   Weston Settle, MS, MD      History DEMARKO ZEIMET is a 83 y.o. male with a history of diabetes, hypertension who states that he has had some trouble with his vision for a couple of days. Apparently was found by his family to be confused. He was found disheveled, and soiled. He was brought into the emergency department where a head CT was performed which showed a hypodensity in the left occipital lobe. An MRI was therefore performed which shows a large left PCA territory infarct as well as multifocal other likely embolic  infarcts. Telemetry showed Afib with RVR (new diagnosis)   LKW: Unclear tpa given?: no, unclear time of onset   Dr Roda Shutters followed the patient earlier in his hospital course and signed off 07/07/2017. Neurology was asked to see the patient again today for increased left UE weakness that started last night. Head CT 07/09/2017 - Acute hemorrhagic infarct in the left occipital lobe similar to prior CT and MRI. New areas of acute infarct in the cerebellum bilaterally and in the right occipital lobe by CT 07/09/2017. Pt had been on ASA 325 mg daily with plans to anticoagulate secondary to new afib; however, INRs have been elevated - etiology uncertain - INR 1.70 today.   SUBJECTIVE (INTERVAL HISTORY) No family at bedside.  Patient is unconscious.     OBJECTIVE Temp:  [98.8 F (37.1 C)-100 F (37.8 C)] 98.8 F (37.1 C) (08/05 1200) Pulse Rate:  [97-132] 116 (08/05 0702) Cardiac Rhythm: Sinus tachycardia (08/05 0745) Resp:  [18-31] 31 (08/05 0702) BP: (137-164)/(83-106) 146/84 (08/05 0702) SpO2:  [93 %-100 %] 100 % (08/05 0702)  CBC:   Recent Labs Lab 06/06/2017 1605 07/07/17 0335  WBC 10.2 8.3  NEUTROABS 8.0*  --   HGB 10.3* 8.5*  HCT 31.9* 26.6*  MCV 88.4 86.1  PLT 134* 100*    Basic Metabolic Panel:   Recent Labs Lab 07/09/17 0335 07/10/17 0329  NA 139 137  K 3.6 3.3*  CL  105 105  CO2 26 26  GLUCOSE 174* 197*  BUN 17 22*  CREATININE 1.43* 1.61*  CALCIUM 8.0* 7.7*    Lipid Panel:     Component Value Date/Time   CHOL 101 07/06/2017 0428   TRIG 69 07/06/2017 0428   HDL 25 (L) 07/06/2017 0428   CHOLHDL 4.0 07/06/2017 0428   VLDL 14 07/06/2017 0428   LDLCALC 62 07/06/2017 0428   HgbA1c:  Lab Results  Component Value Date   HGBA1C 8.0 (H) 07/06/2017   Urine Drug Screen:     Component Value Date/Time   LABOPIA NONE DETECTED 07/06/2017 1811   COCAINSCRNUR NONE DETECTED 07/06/2017 1811   LABBENZ NONE DETECTED 07/06/2017 1811   AMPHETMU NONE DETECTED 07/06/2017  1811   THCU NONE DETECTED 07/06/2017 1811   LABBARB NONE DETECTED 07/06/2017 1811    Alcohol Level     Component Value Date/Time   ETH <5 03/16/2016 1358    IMAGING   Dg Chest 1 View 06/20/2017 The findings suggest low-grade CHF.  There is no alveolar pneumonia.    Ct Head Wo Contrast 06/12/2017  Recent appearing infarct in the left medial occipital lobe. Suspect early acute infarct in the left parietal lobe with questionable early hyperdense vessel change in the left middle cerebral artery. Given these findings, correlation with MR pre and post-contrast advised to further evaluate. No mass or hemorrhage evident. No midline shift. Foci of calcification noted in each carotid siphon.   Ct Head Wo Contrast 07/09/2017  Acute hemorrhagic infarct in the left occipital lobe similar to prior CT and MRI New areas of acute infarct in the cerebellum bilaterally and in the right occipital lobe.  Findings consistent with continued posterior circulation emboli    Mr Brain Wo Contrast (neuro Protocol) Mr Shirlee Latch (cerebral Arteries) 07/01/2017 1. Left PCA territory acute infarct with associated diffuse petechial hemorrhage throughout the infarct site, without mass effect.  2. Occlusion of left posterior cerebral artery at the mid P2 segment.  3. Scattered subcentimeter foci of acute ischemia within both cerebellar hemispheres, left thalamus, left anterior temporal lobe, left parietal lobe and both frontal lobes. The distribution suggests a central cardioembolic process.    Cartoid [place] 07/06/2017 B ICA 1-39% stenosis, VAs antegrade  2D Echo 07/06/2017 - Left ventricle: The cavity size was normal. There was moderate   concentric hypertrophy. Systolic function was moderately reduced.   The estimated ejection fraction was in the range of 35% to 40%.   Moderate diffuse hypokinesis with no identifiable regional   variations. - Ventricular septum: Septal motion showed abnormal function,    dyssynergy, and paradox. These changes are consistent with   intraventricular conduction delay. - Aortic valve: There was trivial regurgitation. - Mitral valve: There was mild regurgitation. - Left atrium: The atrium was mildly dilated. - Right atrium: The atrium was mildly dilated. - Pericardium, extracardiac: A trivial pericardial effusion was   identified. There was a left pleural effusion.   PHYSICAL EXAM  Temp:  [98.8 F (37.1 C)-100 F (37.8 C)] 98.8 F (37.1 C) (08/05 1200) Pulse Rate:  [97-132] 116 (08/05 0702) Resp:  [18-31] 31 (08/05 0702) BP: (137-164)/(83-106) 146/84 (08/05 0702) SpO2:  [93 %-100 %] 100 % (08/05 0702)  General - Well nourished, well developed, lethargic but arousable.  Ophthalmologic - Fundi not visualized due to noncooperation.  Cardiovascular - irregularly irregular heart rate and rhythm.  Neuro - Unconscious.  Eyes do not open to verbal, tactile, or painful stimuli.  No withdrawal to  pain.  No reaction to sternal rub.  No neck stiffness.     ASSESSMENT/PLAN Mr. [name] is a 83 y.o. male with history of diabetes, hypertension, hypothyroidism, and history of acute kidney injury who presented with approximately two days of vision problem for a couple of days. He did not receive IV t-PA due to unknown time LKW.   Stroke: multifocal infarcts bilateral anterior and posterior infarcts, cardioembolic pattern likely due to newly recognized afib.   Resultant  Right upper quadrantanopia, lethargy  CT head: Recent L medial occipital lobe infarct, acute L parietal infarct with questionable hyperdense L MCA.  MRI head:  L PCA infarcts with diffuse petechial hemorrhage, and scattered bilateral subcentimeter acute infarcts in L thalamus, L anterior temporal lobe, and L parietal lobe and both frontal lobes  MRA head:  L P2 occlusion  Ct Head Wo Contrast 07/09/2017 Acute hemorrhagic infarct in the left occipital lobe similar to prior CT and MRI New  areas of acute infarct in the cerebellum bilaterally and in the right occipital lobe. Findings consistent with continued posterior circulation emboli  Carotid Doppler: B ICA 1-39% stenosis, VAs antegrade  2D Echo  EF 35-40%  LDL 62   HgbA1c 8.8  SCDs for VTE prophylaxis DIET - DYS 1 Room service appropriate? Yes; Fluid consistency: Thin  No antithrombotic prior to admission, now on ASA 325mg . Due to afib, we recommend anticoagulation for stroke prevention in 7 days due to moderate sized infarct and petechial hemorrhage. Once anticoagulatoin started, ASA can be discontinued. ASA on hold 2/2 acute hemorrhagic infarct in the left occipital lobe   Ongoing aggressive stroke risk factor management  Therapy recommendations:  SNF recommended  Disposition:  Pending  afib newly recognized  EKG and tele showed afib  Cardiology on board and help appreciated  Initiate on ASA, will recommend anticoagulation in 7 days due to moderate sized infarct and petechial hemorrhage. Once anticoagulation started, ASA can be stopped from stroke standpoint.  ASA on hold 2/2 acute hemorrhagic infarct in the left occipital lobe   MRSA UTI  UA showed WBC TNTC  Urine culture positive for MRSA  On rocephin and vanco  Hx of DVT on Xarelto  Hx of DVT in 03/2016  Was on Xarelto, but reported not on Xarelto PTA  INR was 1.49 -> 1.54 -> 1.56 - etiology unclear, ?? Is pt taking Xarelto at home?? vs. Chronic elevation ??  PTT - 38 (24-36)  Hypertension  Stable  Permissive hypertension (OK if < 220/120) but gradually normalize in 5-7 days  Long-term BP goal normotensive  Hyperlipidemia  Home meds:  none  LDL 62, goal < 70  Add atorvastatin 20mg  PO daily  Continue statin at discharge  Diabetes  HgbA1c 8.8, goal < 7.0  Uncontrolled  Hyperglycemia  On levemir  Likely not taking meds at home  Other Stroke Risk Factors  Advanced age  Other Active Problems  Elevated troponin: 0.3  ->0.39 ->0.44  Elevated Cre 1.31->1.61  Noncompliance   Neglect at home??  Anemia - 8.5 / 26.6  Hypokalemia - 3.3  Thrombocytopenia - 134 -> 100 K  Acute hemorrhagic infarct in the left occipital lobe by CT 07/09/2017 (on ASA 325 mg daily -> ASA now on hold)  Elevated INRs 1.56 today  LFTs - WNL except AST 80 (Ammonia 17 -WNL)  PLAN  Palliative care consult 07/11/2017. "Family understands patient's poor prognosis, but wishes to continue current care for 2-3 days before making final decision regarding transition to  full comfort measures. They do not wish artificial feeding at this time"  ASA on hold 2/2 acute hemorrhagic infarct in the left occipital lobe   Repeat Head CT 07/09/2017 as above  Hospital day # 6       To contact Stroke Continuity provider, please refer to [email or web address]. After hours, contact General Neurology

## 2017-07-11 NOTE — Progress Notes (Signed)
Daily Progress Note   Patient Name: Garrett Bradley       Date: 07/11/2017 DOB: [birth year]  Age: 83 y.o. MRN#: [id number] Attending Physician: Mendel Corning, MD Primary Care Physician: Sharilyn Sites, MD Admit Date: 07/02/2017  Reason for Consultation/Follow-up: Establishing goals of care  Subjective: Patient in bed, nonresponsive.   Met with patient's 2 children, Monica and Derrick, and Granddaughter- Brandice.  I introduced Palliative Medicine as specialized medical care for people living with serious illness. It focuses on providing relief from the symptoms and stress of a serious illness. The goal is to improve quality of life for both the patient and the family.  We discussed a brief life review of the patient.   We discussed their current illness and what it means in the larger context of their on-going co-morbidities.  Natural disease trajectory and expectations at EOL were discussed.  The difference between aggressive medical intervention and comfort care was considered in light of the patient's goals of care.   Advanced directives, concepts specific to code status, artifical feeding and hydration, and rehospitalization were considered and discussed.  Hospice and Palliative Care services outpatient were explained and offered. Reviewed that patient would be eligible for residential hospice.   Questions and concerns were addressed.     Review of Systems  Unable to perform ROS: Patient unresponsive    Length of Stay: 6  Current Medications: Scheduled Meds:  . citalopram  20 mg Oral Daily  . feeding supplement (ENSURE ENLIVE)  237 mL Oral BID BM  . insulin aspart  0-5 Units Subcutaneous QHS  . insulin aspart  0-9 Units Subcutaneous TID WC  . latanoprost  1 drop Both  Eyes QHS  . levothyroxine  137 mcg Oral QAC breakfast  . mouth rinse  15 mL Mouth Rinse BID  . senna  1 tablet Oral Daily  . silver sulfADIAZINE   Topical Daily  . tamsulosin  0.4 mg Oral QPC supper    Continuous Infusions: . dextrose 5 % and 0.45% NaCl 75 mL/hr at 07/10/17 2302  . [START ON 07/12/2017] vancomycin      PRN Meds: acetaminophen **OR** acetaminophen (TYLENOL) oral liquid 160 mg/5 mL **OR** acetaminophen, ALPRAZolam, hydrALAZINE, senna-docusate  Physical Exam  Constitutional:  cachetic  Nursing note and vitals reviewed.  Vital Signs: BP (!) 146/84 (BP Location: Right Arm)   Pulse (!) 116   Temp 98.8 F (37.1 C) (Axillary)   Resp (!) 31   Ht 6' 4" (1.93 m)   Wt 104.3 kg (230 lb)   SpO2 100%   BMI 28.00 kg/m  SpO2: SpO2: 100 % O2 Device: O2 Device: Not Delivered O2 Flow Rate:    Intake/output summary:  Intake/Output Summary (Last 24 hours) at 07/11/17 1326 Last data filed at 07/11/17 9509  Gross per 24 hour  Intake          1581.25 ml  Output              350 ml  Net          1231.25 ml   LBM: Last BM Date: 07/09/17 Baseline Weight: Weight: 104.3 kg (230 lb) Most recent weight: Weight: 104.3 kg (230 lb)       Palliative Assessment/Data: PPS: 10%     Patient Active Problem List   Diagnosis Date Noted  . Palliative care encounter   . Elevated INR   . Failure to thrive (0-17)   . CVA (cerebral vascular accident) ([place]) 06/12/2017  . New onset atrial fibrillation ([place]) 06/23/2017  . Elevated troponin 06/29/2017  . Hypokalemia 06/18/2016  . Scrotal swelling   . Acute kidney injury ([place])   . DVT (deep venous thrombosis) ([place]) 04/13/2016  . Diastolic dysfunction 32/67/1245  . Edema 04/07/2016  . Wrist pain, right 03/24/2016  . Adult failure to thrive 03/24/2016  . Orthostatic hypotension 03/20/2016  . Protein-calorie malnutrition, severe 03/17/2016  . Dehydration 03/16/2016  . Hypernatremia 03/16/2016  . Diabetes mellitus type 2 with  complications ([place]) 80/99/8338  . Depression 03/16/2016  . Thrombocytopenia, unspecified ([place]) 07/27/2014  . Lower urinary tract infectious disease 06/24/2014  . CKD (chronic kidney disease) stage 3, GFR 30-59 ml/min 08/06/2013  . Chronic indwelling Foley catheter 08/06/2013  . Thrush of mouth and esophagus ([place]) 10/02/2011  . Hypertension 10/02/2011  . Hypothyroidism 09/24/2011  . Anemia 09/24/2011  . BPH (benign prostatic hyperplasia) 09/24/2011    Palliative Care Assessment & Plan   Patient Profile: 83 y.o. male  with past medical history of hypothyroidism, HTN, BPH, DM admitted on 06/28/2017 after being found by his family in his home, unable to get up and answer the door, sitting in a chair covered in urine and stool. He had an indwelling foley from December that has not been changed. Multiple medication bottles from December that had not been refilled were found at the bedside. APS consulted in ED. Workup revealed altered mental status, elevated BNP, mild CHF, and MRI showing diffuse petechial hemorrhage and cardioembolic CVA. Has MRSA positive urine culture, sepsis. Remains unresponsive with new acute CVA showing on CT 8/3. Palliative medicine consulted for [place].   Assessment/Recommendations/Plan   Family understands patient's poor prognosis, but wishes to continue current care for 2-3 days before making final decision regarding transition to full comfort measures. They do not wish artificial feeding at this time.  They agree to PMT follow up on Wednesday.   Goals of Care and Additional Recommendations:  Limitations on Scope of Treatment: No Artificial Feeding  Code Status:  DNR  Prognosis:   < 2 weeks due to   Discharge Planning:  To Be Determined  Care plan was discussed with patient's family.  Thank you for allowing the Palliative Medicine Team to assist in the care of this patient.   Total time: 35 minutes  Greater  than 50%  of this time was spent counseling  and coordinating care related to the above assessment and plan.  Mariana Kaufman, AGNP-C Palliative Medicine   Please contact Palliative Medicine Team phone at [phone] for questions and concerns.

## 2017-07-11 NOTE — Progress Notes (Signed)
[facility]                                                                              Patient Demographics  Garrett Bradley, is a 83 y.o. male, DOB - Jul 27, 1934, WUJ:[id number]  Admit date - 06/24/2017   Admitting Physician [name], MD  Outpatient Primary MD for the patient is Assunta Found, MD  Outpatient specialists:   LOS - 6  days   Medical records reviewed and are as summarized below:    Chief Complaint  Patient presents with  . Failure To Thrive       Brief summary   Patient is a 83 year old male with hypothyroidism, hypertension, BPH, diabetes presented due to confusion, acute encephalopathy. Family had called EMS because patient did not answer the door. Per EMS patient was sitting in the chair, soiled in urine and stool. Patient had Hospital on the end on patient's arm dated December 2017 and med bottles were dated the same date and were empty. Patient reported that he had not been changed in 2 weeks. CT head on admission showed recent appearing infarct in the left medial occipital lobe, early acute infarct in the left parietal lobe. Patient was admitted for further workup.  Assessment & Plan    Principal Problem:   Acute CVA (cerebral vascular accident) (HCC) - CT head showed recent appearing infarct in the left medial occipital lobe, early acute infarct in the left parietal lobe,  early hypodense vessel change in the left middle cerebral artery - MRI showed a left PCA territory acute infarct with associated diffuse petechial hemorrhage throughout the infarct site. Occlusion of the left posterior cerebral artery at the mid P2 segment. Scattered subcentimeter foci of acute ischemia within both cerebral hemispheres, left thalamus and left anterior temporal lobe, parietal lobe and both frontal lobes suggesting central cardioembolic process - 2-D echo showed EF 35-40%, moderate diffuse hypokinesis, intraventricular conduction delay  - Carotid  Doppler showed no significant ICA stenosis - Neurology was consulted, recommended aspirin and avoiding and evaluation due to petechial hemorrhage - LDL 62, continue aspirin, on Lipitor 20 mg daily  - Cardiology was consulted, patient seen by Dr Elease Hashimoto, per recommendations ECG showed atrial fibrillation, occasional normal sinus rhythm, had stroke and will eventually need to be started on oral anticoagulation.  - Per neurology, continue aspirin 325 mg for now. Due to Atrial fibrillation, recommend anticoagulation for stroke prevention in 7 days due to moderate-sized infarct and petechial hemorrhage. Once [facility] started aspirin can be discontinued. D/w Dr Roda Shutters, neurology recommended eliquis.  However if INR remains high, Coumadin will be better choice.  - on 8/3, patient noted to be having weakness more in the left upper arm, CT head was repeated which showed acute hemorrhagic infarct in the left occipital lobe, new areas of acute infarction in the cerebellum bilaterally and in the right occipital lobe.  - INR still 1.5, ammonia 17, likely not hepatic encephalopathy causing lethargy, LFTs are normal - Seen by neurology, no new recommendations, palliative care following, meeting today  Active Problems:    Mildly elevated troponins - Likely secondary to above, cardiology consulted, per cardiology likely  due to demand ischemia - 2-D echo showed EF of 35-40% with moderate diffuse hypokinesis - No invasive workup recommended by cardiology at this time  Acute Systolic CHF - 2-D echo showed EF of 35-40%, cardiology consulted - Per cardiology poor candidate for invasive cardiac procedures, will treat with standard medical management for CHF    Failure to thrive (adult) - Patient reportedly found in feces and urine with Hospital bracelet from December 2017, his medications were not filled since December  MRSA  UTI -  patient also had indwelling Foley catheter on admission which had not been changed - Urine  culture showing more than 100,000 colonies of staph aureus - Continue vancomycin, discontinued IV Rocephin  Essential hypertension - Allow for permissive hypertension given the acute stroke - will continue Cardizem for hypertension at discharge   Diabetes mellitus type 2 with hypoglycemia - Placed on D5 drip, hold long-acting insulin -Hemoglobin A1c 8.0   Hypothyroidism - TSH 6.2, continue Synthroid. Unclear if patient was taking his medications, does not appear to have filled after December 2017 - Synthroid restarted, repeat thyroid testing in 4-6 weeks   Moderate malnutrition - Nutrition consulted, continue supplementation  Acute kidney injury -Likely due to poor oral intake, increased IV fluids - Palliative goals of care pending   Thrush - continue Diflucan  Chronic normocytic anemia - Hemoglobin appears to be at baseline  Goals of care - Overall poor prognosis, mental status has not significantly improved, poor oral intake. New left sided weakness with CT head showing acute stroke. Discussed in detail with the patient's family, daughter, son, granddaughter at the bedside on 8/4. CODE STATUS, artificial feeding discussed with the family. Family requested Code status to be changed to DNR. Palliative goals of care meeting pending today. Family is leaning towards comfort care  Code Status:  DNR  DVT Prophylaxis:   SCD's Family Communication:   Disposition Plan: Will await palliative care meeting today  Time Spent in minutes  15 minutes  Procedures:  MRI/MRA brain   2-D echo  Consultants:   Neurology  Palliative care  Antimicrobials:   IV vancomycin   Medications  Scheduled Meds: . citalopram  20 mg Oral Daily  . feeding supplement (ENSURE ENLIVE)  237 mL Oral BID BM  . insulin aspart  0-5 Units Subcutaneous QHS  . insulin aspart  0-9 Units Subcutaneous TID WC  . latanoprost  1 drop Both Eyes QHS  . levothyroxine  137 mcg Oral QAC breakfast  . mouth rinse   15 mL Mouth Rinse BID  . senna  1 tablet Oral Daily  . silver sulfADIAZINE   Topical Daily  . tamsulosin  0.4 mg Oral QPC supper   Continuous Infusions: . dextrose 5 % and 0.45% NaCl 75 mL/hr at 07/10/17 2302  . vancomycin Stopped (07/11/17 0705)   PRN Meds:.acetaminophen **OR** acetaminophen (TYLENOL) oral liquid 160 mg/5 mL **OR** acetaminophen, ALPRAZolam, hydrALAZINE, senna-docusate   Antibiotics   Anti-infectives    Start     Dose/Rate Route Frequency Ordered Stop   07/10/17 0600  vancomycin (VANCOCIN) IVPB 1000 mg/200 mL premix     1,000 mg 200 mL/hr over 60 Minutes Intravenous Every 24 hours 07/09/17 2332     07/06/17 2200  vancomycin (VANCOCIN) 1,250 mg in sodium chloride 0.9 % 250 mL IVPB  Status:  Discontinued     1,250 mg 166.7 mL/hr over 90 Minutes Intravenous Every 24 hours 07/06/17 1020 07/09/17 2329   07/06/17 1000  vancomycin (VANCOCIN) IVPB 1000 mg/200  mL premix  Status:  Discontinued     1,000 mg 200 mL/hr over 60 Minutes Intravenous Every 12 hours 06/06/2017 2138 07/06/17 1017   07/06/17 1000  fluconazole (DIFLUCAN) tablet 100 mg     100 mg Oral Daily 06/10/2017 2319 07/09/17 0959   06/26/2017 2200  vancomycin (VANCOCIN) 1,500 mg in sodium chloride 0.9 % 500 mL IVPB  Status:  Discontinued     1,500 mg 250 mL/hr over 120 Minutes Intravenous  Once 07/03/2017 2135 07/06/17 1017   06/16/2017 2145  cefTRIAXone (ROCEPHIN) 1 g in dextrose 5 % 50 mL IVPB  Status:  Discontinued     1 g 100 mL/hr over 30 Minutes Intravenous Every 24 hours 07/01/2017 2144 07/09/17 1224        Subjective:   Burnett KanarisJames Bradley was seen and examined today. Patient remains somnolent, lethargic, not following any commands.   Objective:   Vitals:   07/10/17 2015 07/10/17 2326 07/11/17 0343 07/11/17 0702  BP: (!) 155/90 (!) 153/94 137/83 (!) 146/84  Pulse: (!) 132 99 97 (!) 116  Resp: (!) 26 (!) 29 (!) 27 (!) 31  Temp: 99.3 F (37.4 C) 100 F (37.8 C) 99 F (37.2 C) 99.3 F (37.4 C)  TempSrc:  Oral Oral Oral Oral  SpO2: 96% 100% 95% 100%  Weight:      Height:        Intake/Output Summary (Last 24 hours) at 07/11/17 1228 Last data filed at 07/11/17 [id number]  Gross per 24 hour  Intake          1581.25 ml  Output              550 ml  Net          1031.25 ml     Wt Readings from Last 3 Encounters:  06/18/2017 104.3 kg (230 lb)  11/26/16 106.1 kg (234 lb)  11/07/16 106.1 kg (234 lb)     Exam   General:  Somnolent, lethargic  Eyes:   HEENT:    Cardiovascular: S1 S2 auscultated, no rubs, murmurs or gallops. Regular rate and rhythm.  Respiratory: Scattered rhonchi  Gastrointestinal: Soft, nontender, nondistended, + bowel sounds  Ext: no pedal edema bilaterally  Neuro: somnolent, lethargic  Musculoskeletal:   Skin: Bilateral lower extremity wrapped  Psych: somnolent, lethargic    Data Reviewed:  I have personally reviewed following labs and imaging studies  Micro Results Recent Results (from the past 240 hour(s))  Urine culture     Status: Abnormal   Collection Time: 06/06/2017  2:39 PM  Result Value Ref Range Status   Specimen Description URINE, RANDOM  Final   Special Requests NONE  Final   Culture (A)  Final    >=100,000 COLONIES/mL METHICILLIN RESISTANT STAPHYLOCOCCUS AUREUS   Report Status 07/08/2017 FINAL  Final   Organism ID, Bacteria METHICILLIN RESISTANT STAPHYLOCOCCUS AUREUS (A)  Final      Susceptibility   Methicillin resistant staphylococcus aureus - MIC*    CIPROFLOXACIN >=8 RESISTANT Resistant     GENTAMICIN <=0.5 SENSITIVE Sensitive     NITROFURANTOIN <=16 SENSITIVE Sensitive     OXACILLIN >=4 RESISTANT Resistant     TETRACYCLINE <=1 SENSITIVE Sensitive     VANCOMYCIN 1 SENSITIVE Sensitive     TRIMETH/SULFA <=10 SENSITIVE Sensitive     CLINDAMYCIN <=0.25 SENSITIVE Sensitive     RIFAMPIN <=0.5 SENSITIVE Sensitive     Inducible Clindamycin NEGATIVE Sensitive     * >=100,000 COLONIES/mL METHICILLIN RESISTANT STAPHYLOCOCCUS AUREUS  MRSA  PCR Screening     Status: Abnormal   Collection Time: 07/06/17  1:00 AM  Result Value Ref Range Status   MRSA by PCR POSITIVE (A) NEGATIVE Final    Comment:        The GeneXpert MRSA Assay (FDA approved for NASAL specimens only), is one component of a comprehensive MRSA colonization surveillance program. It is not intended to diagnose MRSA infection nor to guide or monitor treatment for MRSA infections. RESULT CALLED TO, READ BACK BY AND VERIFIED WITH: M. Lucretia Roers [phone] 07.31.2018 N. MORRIS     Radiology Reports Dg Chest 1 View  Result Date: 06/08/2017 CLINICAL DATA:  Mental status change, incontinence. EXAM: CHEST 1 VIEW COMPARISON:  Chest x-ray of March 16, 2016 FINDINGS: The lungs are adequately inflated. The interstitial markings are mildly increased. There is no alveolar infiltrate or pleural effusion. The heart is top-normal in size. The central pulmonary vascularity is mildly prominent. There are degenerative changes of both shoulders. IMPRESSION: The findings suggest low-grade CHF.  There is no alveolar pneumonia. Electronically Signed   By: David  [place] M.D.   On: 06/26/2017 15:41   Ct Head Wo Contrast  Result Date: 07/09/2017 CLINICAL DATA:  Stroke followup EXAM: CT HEAD WITHOUT CONTRAST TECHNIQUE: Contiguous axial images were obtained from the base of the skull through the vertex without intravenous contrast. COMPARISON:  CT and MRI 06/20/2017 FINDINGS: Brain: Acute infarct left posterior cerebral artery territory involving the left occipital lobe and posterior thalamus. There is mild amount of hemorrhage within the infarct, similar to the prior CT and MRI. New areas of hypodensity in the cerebellum bilaterally compatible with acute infarct. No hemorrhage in the posterior fossa. New area of hypodensity right occipital lobe compatible with acute infarct without hemorrhage. Generalized atrophy. Negative for hydrocephalus. Chronic white matter disease. Chronic left frontal infarct. No  shift of the midline structures. Vascular: Negative for hyperdense vessel Skull: Negative Sinuses/Orbits: Negative Other: None IMPRESSION: Acute hemorrhagic infarct in the left occipital lobe similar to prior CT and MRI New areas of acute infarct in the cerebellum bilaterally and in the right occipital lobe. Findings consistent with continued posterior circulation emboli These results will be called to the ordering clinician or representative by the Radiologist Assistant, and communication documented in the PACS or zVision Dashboard. Electronically Signed   By: Marlan Palau M.D.   On: 07/09/2017 16:50   Ct Head Wo Contrast  Result Date: 06/15/2017 CLINICAL DATA:  Confusion with incontinence EXAM: CT HEAD WITHOUT CONTRAST TECHNIQUE: Contiguous axial images were obtained from the base of the skull through the vertex without intravenous contrast. COMPARISON:  July 26, 2014 FINDINGS: Brain: There is mild diffuse atrophy. There is a recent appearing infarct in the medial left occipital lobe with decreased attenuation. There is sulcal effacement in the posterior left parietal lobe, potentially a second focus of early infarct. There is no edema in this area appreciable. There is no mass, hemorrhage, or extra-axial fluid collection. No midline shift. Elsewhere, there is slight small vessel disease in the centra semiovale bilaterally. Vascular: There is subtle increased attenuation in the proximal left middle cerebral artery compared to prior study. This appearance may represent early hyperdense vessel and may be indicative of early acute infarct in the left middle cerebral artery distribution. There is calcification in each carotid siphon region. Skull: Bony calvarium appears intact. Sinuses/Orbits: Visualized paranasal sinuses are clear. Visualized orbits appear symmetric bilaterally. Other: Mastoid air cells are clear. IMPRESSION: Recent appearing infarct in the left medial occipital  lobe. Suspect early acute  infarct in the left parietal lobe with questionable early hyperdense vessel change in the left middle cerebral artery. Given these findings, correlation with MR pre and post-contrast advised to further evaluate. No mass or hemorrhage evident. No midline shift. Foci of calcification noted in each carotid siphon. Electronically Signed   By: Bretta Bang III M.D.   On: 08/04/2017 16:04   Mr Brain Wo Contrast (neuro Protocol)  Result Date: 08-04-17 CLINICAL DATA:  Altered mental status and weakness EXAM: MRI HEAD WITHOUT CONTRAST MRA HEAD WITHOUT CONTRAST TECHNIQUE: Multiplanar, multiecho pulse sequences of the brain and surrounding structures were obtained without intravenous contrast. Angiographic images of the head were obtained using MRA technique without contrast. COMPARISON:  None. FINDINGS: MRI HEAD FINDINGS Brain: The midline structures are normal. There is a large area of diffusion restriction within the left posterior cerebral artery distribution. There are scattered other small foci of diffusion restriction within both cerebellar hemispheres, the left thalamus and anterior temporal lobe and multiple sites in the left parietal lobe and both frontal lobes. There is multifocal hyperintense T2-weighted signal within the periventricular white matter, most often seen in the setting of chronic microvascular ischemia. No mass lesion. There is petechial hemorrhage throughout the left PCA distribution. No hydrocephalus, age advanced atrophy or lobar predominant volume loss. No dural abnormality or extra-axial collection. Skull and upper cervical spine: The visualized skull base, calvarium, upper cervical spine and extracranial soft tissues are normal. Sinuses/Orbits: No fluid levels or advanced mucosal thickening. No mastoid effusion. Normal orbits. MRA HEAD FINDINGS Intracranial internal carotid arteries: Normal. Anterior cerebral arteries: Normal. Middle cerebral arteries: Normal. Posterior communicating  arteries: Present on the right. Posterior cerebral arteries: The left PCA is occluded in its mid P2 segment. The right PCA is normal. Basilar artery: Normal. Vertebral arteries: Codominant Normal. Superior cerebellar arteries: Normal. Anterior inferior cerebellar arteries: Normal. Posterior inferior cerebellar arteries: Normal. IMPRESSION: 1. Left PCA territory acute infarct with associated diffuse petechial hemorrhage throughout the infarct site, without mass effect. 2. Occlusion of left posterior cerebral artery at the mid P2 segment. 3. Scattered subcentimeter foci of acute ischemia within both cerebellar hemispheres, left thalamus, left anterior temporal lobe, left parietal lobe and both frontal lobes. The distribution suggests a central cardioembolic process. Electronically Signed   By: Deatra Robinson M.D.   On: 08/04/2017 17:46   Mr Maxine Glenn Head (cerebral Arteries)  Result Date: 2017-08-04 CLINICAL DATA:  Altered mental status and weakness EXAM: MRI HEAD WITHOUT CONTRAST MRA HEAD WITHOUT CONTRAST TECHNIQUE: Multiplanar, multiecho pulse sequences of the brain and surrounding structures were obtained without intravenous contrast. Angiographic images of the head were obtained using MRA technique without contrast. COMPARISON:  None. FINDINGS: MRI HEAD FINDINGS Brain: The midline structures are normal. There is a large area of diffusion restriction within the left posterior cerebral artery distribution. There are scattered other small foci of diffusion restriction within both cerebellar hemispheres, the left thalamus and anterior temporal lobe and multiple sites in the left parietal lobe and both frontal lobes. There is multifocal hyperintense T2-weighted signal within the periventricular white matter, most often seen in the setting of chronic microvascular ischemia. No mass lesion. There is petechial hemorrhage throughout the left PCA distribution. No hydrocephalus, age advanced atrophy or lobar predominant volume  loss. No dural abnormality or extra-axial collection. Skull and upper cervical spine: The visualized skull base, calvarium, upper cervical spine and extracranial soft tissues are normal. Sinuses/Orbits: No fluid levels or advanced mucosal thickening. No mastoid effusion.  Normal orbits. MRA HEAD FINDINGS Intracranial internal carotid arteries: Normal. Anterior cerebral arteries: Normal. Middle cerebral arteries: Normal. Posterior communicating arteries: Present on the right. Posterior cerebral arteries: The left PCA is occluded in its mid P2 segment. The right PCA is normal. Basilar artery: Normal. Vertebral arteries: Codominant Normal. Superior cerebellar arteries: Normal. Anterior inferior cerebellar arteries: Normal. Posterior inferior cerebellar arteries: Normal. IMPRESSION: 1. Left PCA territory acute infarct with associated diffuse petechial hemorrhage throughout the infarct site, without mass effect. 2. Occlusion of left posterior cerebral artery at the mid P2 segment. 3. Scattered subcentimeter foci of acute ischemia within both cerebellar hemispheres, left thalamus, left anterior temporal lobe, left parietal lobe and both frontal lobes. The distribution suggests a central cardioembolic process. Electronically Signed   By: Deatra Robinson M.D.   On: 06/15/2017 17:46    Lab Data:  CBC:  Recent Labs Lab 06/30/2017 1605 07/07/17 0335  WBC 10.2 8.3  NEUTROABS 8.0*  --   HGB 10.3* 8.5*  HCT 31.9* 26.6*  MCV 88.4 86.1  PLT 134* 100*   Basic Metabolic Panel:  Recent Labs Lab 06/10/2017 1605 07/07/17 0335 07/09/17 0335 07/10/17 0329  NA 140 138 139 137  K 3.2* 3.0* 3.6 3.3*  CL 99* 104 105 105  CO2 31 29 26 26   GLUCOSE 198* 67 174* 197*  BUN 31* 20 17 22*  CREATININE 1.31* 1.21 1.43* 1.61*  CALCIUM 8.5* 7.7* 8.0* 7.7*   GFR: Estimated Creatinine Clearance: 47.7 mL/min (A) (by C-G formula based on SCr of 1.61 mg/dL (H)). Liver Function Tests:  Recent Labs Lab 06/14/2017 1605  07/10/17 1619  AST 29 80*  ALT 17 25  ALKPHOS 64 68  BILITOT 0.9 0.7  PROT 9.3* 7.8  ALBUMIN 2.4* 1.6*   No results for input(s): LIPASE, AMYLASE in the last 168 hours.  Recent Labs Lab 07/10/17 1619  AMMONIA 17   Coagulation Profile:  Recent Labs Lab 07/06/17 0146 07/07/17 0335 07/09/17 0335 07/10/17 0329 07/10/17 1619  INR 1.49 1.54 1.64 1.70 1.56   Cardiac Enzymes:  Recent Labs Lab 06/12/2017 1605 07/06/17 0146 07/06/17 0830 07/06/17 1337 07/06/17 1846  CKTOTAL  --   --   --   --  41*  TROPONINI 0.34* 0.39* 0.44* 0.48*  --    BNP (last 3 results) No results for input(s): PROBNP in the last 8760 hours. HbA1C: No results for input(s): HGBA1C in the last 72 hours. CBG:  Recent Labs Lab 07/10/17 1208 07/10/17 1704 07/10/17 2059 07/11/17 0820 07/11/17 1139  GLUCAP 197* 152* 138* 176* 170*   Lipid Profile: No results for input(s): CHOL, HDL, LDLCALC, TRIG, CHOLHDL, LDLDIRECT in the last 72 hours. Thyroid Function Tests: No results for input(s): TSH, T4TOTAL, FREET4, T3FREE, THYROIDAB in the last 72 hours. Anemia Panel: No results for input(s): VITAMINB12, FOLATE, FERRITIN, TIBC, IRON, RETICCTPCT in the last 72 hours. Urine analysis:    Component Value Date/Time   COLORURINE YELLOW 06/21/2017 1439   APPEARANCEUR CLOUDY (A) 07/01/2017 1439   LABSPEC 1.015 06/29/2017 1439   PHURINE 6.0 06/08/2017 1439   GLUCOSEU 50 (A) 06/14/2017 1439   HGBUR LARGE (A) 06/25/2017 1439   BILIRUBINUR NEGATIVE 06/22/2017 1439   KETONESUR NEGATIVE 06/19/2017 1439   PROTEINUR 100 (A) 06/18/2017 1439   UROBILINOGEN 0.2 07/28/2014 0736   NITRITE NEGATIVE 06/29/2017 1439   LEUKOCYTESUR LARGE (A) 06/12/2017 1439     Aahana Elza M.D. [facility] 07/11/2017, 12:28 PM  Pager: [phone] Between 7am to 7pm - call Pager -  [phone]  After 7pm go to [email or web address] - password [email or web address]  Call night coverage person covering after 7pm

## 2017-07-12 ENCOUNTER — Inpatient Hospital Stay (HOSPITAL_COMMUNITY): Payer: Medicare Other

## 2017-07-12 DIAGNOSIS — G9340 Encephalopathy, unspecified: Secondary | ICD-10-CM

## 2017-07-12 DIAGNOSIS — Z7189 Other specified counseling: Secondary | ICD-10-CM

## 2017-07-12 DIAGNOSIS — I6300 Cerebral infarction due to thrombosis of unspecified precerebral artery: Secondary | ICD-10-CM

## 2017-07-12 LAB — BASIC METABOLIC PANEL
ANION GAP: 6 (ref 5–15)
BUN: 25 mg/dL — ABNORMAL HIGH (ref 6–20)
CALCIUM: 7.5 mg/dL — AB (ref 8.9–10.3)
CHLORIDE: 105 mmol/L (ref 101–111)
CO2: 26 mmol/L (ref 22–32)
CREATININE: 1.86 mg/dL — AB (ref 0.61–1.24)
GFR calc non Af Amer: 32 mL/min — ABNORMAL LOW (ref >60)
GFR, EST AFRICAN AMERICAN: 37 mL/min — AB (ref >60)
Glucose, Bld: 160 mg/dL — ABNORMAL HIGH (ref 65–99)
Potassium: 3.5 mmol/L (ref 3.5–5.1)
Sodium: 137 mmol/L (ref 135–145)

## 2017-07-12 LAB — GLUCOSE, CAPILLARY
GLUCOSE-CAPILLARY: 158 mg/dL — AB (ref 65–99)
GLUCOSE-CAPILLARY: 156 mg/dL — AB (ref 65–99)
Glucose-Capillary: 177 mg/dL — ABNORMAL HIGH (ref 65–99)
Glucose-Capillary: 168 mg/dL — ABNORMAL HIGH (ref 65–99)
Glucose-Capillary: 154 mg/dL — ABNORMAL HIGH (ref 65–99)

## 2017-07-12 LAB — PROTIME-INR
INR: 1.69
Prothrombin Time: 20.1 seconds — ABNORMAL HIGH (ref 11.4–15.2)

## 2017-07-12 MED ORDER — JEVITY 1.2 CAL PO LIQD: 1000.0000 mL | ORAL | Status: DC

## 2017-07-12 MED ORDER — GLUCERNA 1.2 CAL PO LIQD
1000.0000 mL | ORAL | Status: DC
  Administered 2017-07-12 – 2017-07-26 (×16): 1000 mL
  Filled 2017-07-12 (×29): qty 1000

## 2017-07-12 NOTE — Progress Notes (Signed)
EEG completed, results pending. 

## 2017-07-12 NOTE — Progress Notes (Signed)
[facility]                                                                              Patient Demographics  Garrett Bradley, is a 83 y.o. male, DOB - 08/25/1934, ZOX:[id number]  Admit date - 2017-08-07   Admitting Physician [name], MD  Outpatient Primary MD for the patient is Assunta Bradley, [name], MD  Outpatient specialists:   LOS - 7  days   Medical records reviewed and are as summarized below:    Chief Complaint  Patient presents with  . Failure To Thrive       Brief summary   Patient is a 83 year old male with hypothyroidism, hypertension, BPH, diabetes presented due to confusion, acute encephalopathy. Family had called EMS because patient did not answer the door. Per EMS patient was sitting in the chair, soiled in urine and stool. Patient had Hospital on the end on patient's arm dated December 2017 and med bottles were dated the same date and were empty. Patient reported that he had not been changed in 2 weeks. CT head on admission showed recent appearing infarct in the left medial occipital lobe, early acute infarct in the left parietal lobe. Patient was admitted for further workup.  Assessment & Plan    Principal Problem:   Acute CVA (cerebral vascular accident) (HCC)With acute metabolic encephalopathy - CT head showed recent appearing infarct in the left medial occipital lobe, early acute infarct in the left parietal lobe,  early hypodense vessel change in the left middle cerebral artery - MRI showed a left PCA territory acute infarct with associated diffuse petechial hemorrhage throughout the infarct site. Occlusion of the left posterior cerebral artery at the mid P2 segment. Scattered subcentimeter foci of acute ischemia within both cerebral hemispheres, left thalamus and left anterior temporal lobe, parietal lobe and both frontal lobes suggesting central cardioembolic process - 2-D echo showed EF 35-40%, moderate diffuse hypokinesis, intraventricular  conduction delay  - Carotid Doppler showed no significant ICA stenosis - Neurology was consulted, recommended aspirin and avoiding and evaluation due to petechial hemorrhage - LDL 62, continue aspirin, on Lipitor 20 mg daily  - Cardiology was consulted, patient seen by Dr Elease HashimotoNahser, per recommendations ECG showed atrial fibrillation, occasional normal sinus rhythm, had stroke and will eventually need to be started on oral anticoagulation.  - Per neurology, continue aspirin 325 mg for now. Due to Atrial fibrillation, recommend anticoagulation for stroke prevention in 7 days due to moderate-sized infarct and petechial hemorrhage. [facility] can be discontinued. D/w Dr Roda ShuttersXu, neurology recommended eliquis.  However if INR remains high, Coumadin will be better choice.  - on 8/3, patient noted to be having weakness more in the left upper arm, CT head was repeated which showed acute hemorrhagic infarct in the left occipital lobe, new areas of acute infarction in the cerebellum bilaterally and in the right occipital lobe.  - INR still 1.5, ammonia 17, likely not hepatic encephalopathy causing lethargy, LFTs are normal - EEG showed moderate diffuse slowing of electro cerebral activity, no spikes or paroxysmal activity, no epileptiform activity on this recording -TSH was 6.2, free T4 0.9 ,  neurology placed patient on Synthroid 100 g daily, rule out myxedema coma   - Discussed in detail with patient's daughter at the bedside and son (on the phone at the same time) who requested artificial feeding tube while they're awaiting decision how to proceed. Will place Cortrack, initiate tube feeding for now. - Palliative team meeting again on Wednesday 8/8 a.m.   Active Problems:    Mildly elevated troponins - Likely secondary to above, cardiology consulted, per cardiology likely due to demand ischemia - 2-D echo showed EF of 35-40% with moderate diffuse hypokinesis - No invasive workup recommended by  cardiology at this time  Acute Systolic CHF - 2-D echo showed EF of 35-40%, cardiology consulted - Per cardiology poor candidate for invasive cardiac procedures, will treat with standard medical management for CHF    Failure to thrive (adult) - Patient reportedly found in feces and urine with Hospital bracelet from December 2017, his medications were not filled since December  MRSA  UTI -  patient also had indwelling Foley catheter on admission which had not been changed - Urine culture showing more than 100,000 colonies of staph aureus - Continue vancomycin, discontinued IV Rocephin  Essential hypertension - Allow for permissive hypertension given the acute stroke - will continue Cardizem for hypertension at discharge   Diabetes mellitus type 2 with hypoglycemia - Placed on D5 drip, hold long-acting insulin -Hemoglobin A1c 8.0   Hypothyroidism - TSH 6.2, continue Synthroid. Unclear if patient was taking his medications, does not appear to have filled after December 2017 - Synthroid restarted, repeat thyroid testing in 4-6 weeks   Moderate malnutrition - Nutrition consulted, continue supplementation  Acute kidney injury -Likely due to poor oral intake, increased IV fluids - Palliative goals of care pending   Thrush - continue Diflucan  Chronic normocytic anemia - Hemoglobin appears to be at baseline  Goals of care - Overall poor prognosis, mental status has not significantly improved, poor oral intake. New left sided weakness with CT head showing acute stroke. Discussed in detail with the patient's family, daughter, son, granddaughter at the bedside on 8/4. CODE STATUS, artificial feeding discussed with the family. Family requested Code status to be changed to DNR. Palliative goals of care meeting done yesterday on 8/5 however family requested time and rescheduled it to Wednesday.  Code Status:  DNR  DVT Prophylaxis:   SCD's Family Communication: Discussed in detail with  patient's daughter at the bedside and with the patient's son on phone  Disposition Plan:   Time Spent in minutes  25 minutes  Procedures:  MRI/MRA brain   2-D echo  Consultants:   Neurology  Palliative care  Antimicrobials:   IV vancomycin   Medications  Scheduled Meds: . citalopram  20 mg Oral Daily  . insulin aspart  0-5 Units Subcutaneous QHS  . insulin aspart  0-9 Units Subcutaneous TID WC  . latanoprost  1 drop Both Eyes QHS  . levothyroxine  100 mcg Intravenous Daily  . mouth rinse  15 mL Mouth Rinse BID  . senna  1 tablet Oral Daily  . silver sulfADIAZINE   Topical Daily  . tamsulosin  0.4 mg Oral QPC supper   Continuous Infusions: . dextrose 5 % and 0.45% NaCl 75 mL/hr at 07/12/17 0500  . vancomycin Stopped (07/12/17 0702)   PRN Meds:.acetaminophen **OR** acetaminophen (TYLENOL) oral liquid 160 mg/5 mL **OR** acetaminophen, ALPRAZolam, hydrALAZINE, senna-docusate   Antibiotics   Anti-infectives    Start     Dose/Rate Route  Frequency Ordered Stop   07/12/17 0600  vancomycin (VANCOCIN) IVPB 750 mg/150 ml premix     750 mg 150 mL/hr over 60 Minutes Intravenous Every 24 hours 07/11/17 1244     07/10/17 0600  vancomycin (VANCOCIN) IVPB 1000 mg/200 mL premix  Status:  Discontinued     1,000 mg 200 mL/hr over 60 Minutes Intravenous Every 24 hours 07/09/17 2332 07/11/17 1244   07/06/17 2200  vancomycin (VANCOCIN) 1,250 mg in sodium chloride 0.9 % 250 mL IVPB  Status:  Discontinued     1,250 mg 166.7 mL/hr over 90 Minutes Intravenous Every 24 hours 07/06/17 1020 07/09/17 2329   07/06/17 1000  vancomycin (VANCOCIN) IVPB 1000 mg/200 mL premix  Status:  Discontinued     1,000 mg 200 mL/hr over 60 Minutes Intravenous Every 12 hours 07/24/2017 2138 07/06/17 1017   07/06/17 1000  fluconazole (DIFLUCAN) tablet 100 mg     100 mg Oral Daily 24-Jul-2017 2319 07/09/17 0959   07-24-2017 2200  vancomycin (VANCOCIN) 1,500 mg in sodium chloride 0.9 % 500 mL IVPB  Status:   Discontinued     1,500 mg 250 mL/hr over 120 Minutes Intravenous  Once 2017/07/24 2135 07/06/17 1017   2017/07/24 2145  cefTRIAXone (ROCEPHIN) 1 g in dextrose 5 % 50 mL IVPB  Status:  Discontinued     1 g 100 mL/hr over 30 Minutes Intravenous Every 24 hours 2017/07/24 2144 07/09/17 1224        Subjective:   Norton Bivins was seen and examined today. He remains lethargic, unresponsive, not following any commands.   Objective:   Vitals:   07/12/17 0500 07/12/17 0600 07/12/17 0753 07/12/17 1100  BP:  134/85 (!) 122/91 (!) 149/68  Pulse:  69 61   Resp: (!) 25 (!) 23  (!) 25  Temp: 100.2 F (37.9 C) 99.7 F (37.6 C)  98.9 F (37.2 C)  TempSrc: Oral Oral    SpO2: 98% 100% 98%   Weight:      Height:        Intake/Output Summary (Last 24 hours) at 07/12/17 1207 Last data filed at 07/12/17 0830  Gross per 24 hour  Intake           1652.5 ml  Output              925 ml  Net            727.5 ml     Wt Readings from Last 3 Encounters:  Jul 24, 2017 104.3 kg (230 lb)  11/26/16 106.1 kg (234 lb)  11/07/16 106.1 kg (234 lb)     Exam    General: Somnolent, lethargic  Eyes:   HEENT:    Cardiovascular: S1 S2 auscultated, regular rate and rhythm  Respiratory: Bilateral rhonchi  Gastrointestinal: Soft, nontender, nondistended, + bowel sounds  Ext: bilateral lower extremity wrapped  Neuro: somnolent, lethargic, not following any commands  Musculoskeletal:   Skin: Bilateral lower extremity wrapped  Psych: somnolent, lethargic   Data Reviewed:  I have personally reviewed following labs and imaging studies  Micro Results Recent Results (from the past 240 hour(s))  Urine culture     Status: Abnormal   Collection Time: July 24, 2017  2:39 PM  Result Value Ref Range Status   Specimen Description URINE, RANDOM  Final   Special Requests NONE  Final   Culture (A)  Final    >=100,000 COLONIES/mL METHICILLIN RESISTANT STAPHYLOCOCCUS AUREUS   Report Status 07/08/2017 FINAL   Final   Organism ID,  Bacteria METHICILLIN RESISTANT STAPHYLOCOCCUS AUREUS (A)  Final      Susceptibility   Methicillin resistant staphylococcus aureus - MIC*    CIPROFLOXACIN >=8 RESISTANT Resistant     GENTAMICIN <=0.5 SENSITIVE Sensitive     NITROFURANTOIN <=16 SENSITIVE Sensitive     OXACILLIN >=4 RESISTANT Resistant     TETRACYCLINE <=1 SENSITIVE Sensitive     VANCOMYCIN 1 SENSITIVE Sensitive     TRIMETH/SULFA <=10 SENSITIVE Sensitive     CLINDAMYCIN <=0.25 SENSITIVE Sensitive     RIFAMPIN <=0.5 SENSITIVE Sensitive     Inducible Clindamycin NEGATIVE Sensitive     * >=100,000 COLONIES/mL METHICILLIN RESISTANT STAPHYLOCOCCUS AUREUS  MRSA PCR Screening     Status: Abnormal   Collection Time: 07/06/17  1:00 AM  Result Value Ref Range Status   MRSA by PCR POSITIVE (A) NEGATIVE Final    Comment:        The GeneXpert MRSA Assay (FDA approved for NASAL specimens only), is one component of a comprehensive MRSA colonization surveillance program. It is not intended to diagnose MRSA infection nor to guide or monitor treatment for MRSA infections. RESULT CALLED TO, READ BACK BY AND VERIFIED WITH: M. Lucretia Roers [phone] 07.31.2018 N. MORRIS     Radiology Reports Dg Chest 1 View  Result Date: 07/06/2017 CLINICAL DATA:  Mental status change, incontinence. EXAM: CHEST 1 VIEW COMPARISON:  Chest x-ray of March 16, 2016 FINDINGS: The lungs are adequately inflated. The interstitial markings are mildly increased. There is no alveolar infiltrate or pleural effusion. The heart is top-normal in size. The central pulmonary vascularity is mildly prominent. There are degenerative changes of both shoulders. IMPRESSION: The findings suggest low-grade CHF.  There is no alveolar pneumonia. Electronically Signed   By: David  [place] M.D.   On: 06/11/2017 15:41   Ct Head Wo Contrast  Result Date: 07/09/2017 CLINICAL DATA:  Stroke followup EXAM: CT HEAD WITHOUT CONTRAST TECHNIQUE: Contiguous axial images were obtained  from the base of the skull through the vertex without intravenous contrast. COMPARISON:  CT and MRI 06/28/2017 FINDINGS: Brain: Acute infarct left posterior cerebral artery territory involving the left occipital lobe and posterior thalamus. There is mild amount of hemorrhage within the infarct, similar to the prior CT and MRI. New areas of hypodensity in the cerebellum bilaterally compatible with acute infarct. No hemorrhage in the posterior fossa. New area of hypodensity right occipital lobe compatible with acute infarct without hemorrhage. Generalized atrophy. Negative for hydrocephalus. Chronic white matter disease. Chronic left frontal infarct. No shift of the midline structures. Vascular: Negative for hyperdense vessel Skull: Negative Sinuses/Orbits: Negative Other: None IMPRESSION: Acute hemorrhagic infarct in the left occipital lobe similar to prior CT and MRI New areas of acute infarct in the cerebellum bilaterally and in the right occipital lobe. Findings consistent with continued posterior circulation emboli These results will be called to the ordering clinician or representative by the Radiologist Assistant, and communication documented in the PACS or zVision Dashboard. Electronically Signed   By: Marlan Palau M.D.   On: 07/09/2017 16:50   Ct Head Wo Contrast  Result Date: 06/23/2017 CLINICAL DATA:  Confusion with incontinence EXAM: CT HEAD WITHOUT CONTRAST TECHNIQUE: Contiguous axial images were obtained from the base of the skull through the vertex without intravenous contrast. COMPARISON:  July 26, 2014 FINDINGS: Brain: There is mild diffuse atrophy. There is a recent appearing infarct in the medial left occipital lobe with decreased attenuation. There is sulcal effacement in the posterior left parietal lobe, potentially a second  focus of early infarct. There is no edema in this area appreciable. There is no mass, hemorrhage, or extra-axial fluid collection. No midline shift. Elsewhere, there  is slight small vessel disease in the centra semiovale bilaterally. Vascular: There is subtle increased attenuation in the proximal left middle cerebral artery compared to prior study. This appearance may represent early hyperdense vessel and may be indicative of early acute infarct in the left middle cerebral artery distribution. There is calcification in each carotid siphon region. Skull: Bony calvarium appears intact. Sinuses/Orbits: Visualized paranasal sinuses are clear. Visualized orbits appear symmetric bilaterally. Other: Mastoid air cells are clear. IMPRESSION: Recent appearing infarct in the left medial occipital lobe. Suspect early acute infarct in the left parietal lobe with questionable early hyperdense vessel change in the left middle cerebral artery. Given these findings, correlation with MR pre and post-contrast advised to further evaluate. No mass or hemorrhage evident. No midline shift. Foci of calcification noted in each carotid siphon. Electronically Signed   By: Bretta Bang III M.D.   On: 2017/07/09 16:04   Mr Brain Wo Contrast (neuro Protocol)  Result Date: 07-09-2017 CLINICAL DATA:  Altered mental status and weakness EXAM: MRI HEAD WITHOUT CONTRAST MRA HEAD WITHOUT CONTRAST TECHNIQUE: Multiplanar, multiecho pulse sequences of the brain and surrounding structures were obtained without intravenous contrast. Angiographic images of the head were obtained using MRA technique without contrast. COMPARISON:  None. FINDINGS: MRI HEAD FINDINGS Brain: The midline structures are normal. There is a large area of diffusion restriction within the left posterior cerebral artery distribution. There are scattered other small foci of diffusion restriction within both cerebellar hemispheres, the left thalamus and anterior temporal lobe and multiple sites in the left parietal lobe and both frontal lobes. There is multifocal hyperintense T2-weighted signal within the periventricular white matter, most  often seen in the setting of chronic microvascular ischemia. No mass lesion. There is petechial hemorrhage throughout the left PCA distribution. No hydrocephalus, age advanced atrophy or lobar predominant volume loss. No dural abnormality or extra-axial collection. Skull and upper cervical spine: The visualized skull base, calvarium, upper cervical spine and extracranial soft tissues are normal. Sinuses/Orbits: No fluid levels or advanced mucosal thickening. No mastoid effusion. Normal orbits. MRA HEAD FINDINGS Intracranial internal carotid arteries: Normal. Anterior cerebral arteries: Normal. Middle cerebral arteries: Normal. Posterior communicating arteries: Present on the right. Posterior cerebral arteries: The left PCA is occluded in its mid P2 segment. The right PCA is normal. Basilar artery: Normal. Vertebral arteries: Codominant Normal. Superior cerebellar arteries: Normal. Anterior inferior cerebellar arteries: Normal. Posterior inferior cerebellar arteries: Normal. IMPRESSION: 1. Left PCA territory acute infarct with associated diffuse petechial hemorrhage throughout the infarct site, without mass effect. 2. Occlusion of left posterior cerebral artery at the mid P2 segment. 3. Scattered subcentimeter foci of acute ischemia within both cerebellar hemispheres, left thalamus, left anterior temporal lobe, left parietal lobe and both frontal lobes. The distribution suggests a central cardioembolic process. Electronically Signed   By: Deatra Robinson M.D.   On: 07-09-2017 17:46   Mr Maxine Glenn Head (cerebral Arteries)  Result Date: 07/09/17 CLINICAL DATA:  Altered mental status and weakness EXAM: MRI HEAD WITHOUT CONTRAST MRA HEAD WITHOUT CONTRAST TECHNIQUE: Multiplanar, multiecho pulse sequences of the brain and surrounding structures were obtained without intravenous contrast. Angiographic images of the head were obtained using MRA technique without contrast. COMPARISON:  None. FINDINGS: MRI HEAD FINDINGS Brain:  The midline structures are normal. There is a large area of diffusion restriction within the left posterior  cerebral artery distribution. There are scattered other small foci of diffusion restriction within both cerebellar hemispheres, the left thalamus and anterior temporal lobe and multiple sites in the left parietal lobe and both frontal lobes. There is multifocal hyperintense T2-weighted signal within the periventricular white matter, most often seen in the setting of chronic microvascular ischemia. No mass lesion. There is petechial hemorrhage throughout the left PCA distribution. No hydrocephalus, age advanced atrophy or lobar predominant volume loss. No dural abnormality or extra-axial collection. Skull and upper cervical spine: The visualized skull base, calvarium, upper cervical spine and extracranial soft tissues are normal. Sinuses/Orbits: No fluid levels or advanced mucosal thickening. No mastoid effusion. Normal orbits. MRA HEAD FINDINGS Intracranial internal carotid arteries: Normal. Anterior cerebral arteries: Normal. Middle cerebral arteries: Normal. Posterior communicating arteries: Present on the right. Posterior cerebral arteries: The left PCA is occluded in its mid P2 segment. The right PCA is normal. Basilar artery: Normal. Vertebral arteries: Codominant Normal. Superior cerebellar arteries: Normal. Anterior inferior cerebellar arteries: Normal. Posterior inferior cerebellar arteries: Normal. IMPRESSION: 1. Left PCA territory acute infarct with associated diffuse petechial hemorrhage throughout the infarct site, without mass effect. 2. Occlusion of left posterior cerebral artery at the mid P2 segment. 3. Scattered subcentimeter foci of acute ischemia within both cerebellar hemispheres, left thalamus, left anterior temporal lobe, left parietal lobe and both frontal lobes. The distribution suggests a central cardioembolic process. Electronically Signed   By: Deatra Robinson M.D.   On: 06/11/2017  17:46    Lab Data:  CBC:  Recent Labs Lab 06/18/2017 1605 07/07/17 0335  WBC 10.2 8.3  NEUTROABS 8.0*  --   HGB 10.3* 8.5*  HCT 31.9* 26.6*  MCV 88.4 86.1  PLT 134* 100*   Basic Metabolic Panel:  Recent Labs Lab 06/13/2017 1605 07/07/17 0335 07/09/17 0335 07/10/17 0329 07/12/17 0236  NA 140 138 139 137 137  K 3.2* 3.0* 3.6 3.3* 3.5  CL 99* 104 105 105 105  CO2 31 29 26 26 26   GLUCOSE 198* 67 174* 197* 160*  BUN 31* 20 17 22* 25*  CREATININE 1.31* 1.21 1.43* 1.61* 1.86*  CALCIUM 8.5* 7.7* 8.0* 7.7* 7.5*   GFR: Estimated Creatinine Clearance: 41.3 mL/min (A) (by C-G formula based on SCr of 1.86 mg/dL (H)). Liver Function Tests:  Recent Labs Lab 07/06/2017 1605 07/10/17 1619  AST 29 80*  ALT 17 25  ALKPHOS 64 68  BILITOT 0.9 0.7  PROT 9.3* 7.8  ALBUMIN 2.4* 1.6*   No results for input(s): LIPASE, AMYLASE in the last 168 hours.  Recent Labs Lab 07/10/17 1619  AMMONIA 17   Coagulation Profile:  Recent Labs Lab 07/07/17 0335 07/09/17 0335 07/10/17 0329 07/10/17 1619 07/12/17 0236  INR 1.54 1.64 1.70 1.56 1.69   Cardiac Enzymes:  Recent Labs Lab 06/23/2017 1605 07/06/17 0146 07/06/17 0830 07/06/17 1337 07/06/17 1846  CKTOTAL  --   --   --   --  41*  TROPONINI 0.34* 0.39* 0.44* 0.48*  --    BNP (last 3 results) No results for input(s): PROBNP in the last 8760 hours. HbA1C: No results for input(s): HGBA1C in the last 72 hours. CBG:  Recent Labs Lab 07/11/17 1630 07/11/17 2112 07/12/17 0755 07/12/17 0936 07/12/17 1155  GLUCAP 152* 138* 177* 168* 158*   Lipid Profile: No results for input(s): CHOL, HDL, LDLCALC, TRIG, CHOLHDL, LDLDIRECT in the last 72 hours. Thyroid Function Tests: No results for input(s): TSH, T4TOTAL, FREET4, T3FREE, THYROIDAB in the last 72 hours. Anemia  Panel: No results for input(s): VITAMINB12, FOLATE, FERRITIN, TIBC, IRON, RETICCTPCT in the last 72 hours. Urine analysis:    Component Value Date/Time    COLORURINE YELLOW 06/10/2017 1439   APPEARANCEUR CLOUDY (A) 06/29/2017 1439   LABSPEC 1.015 06/16/2017 1439   PHURINE 6.0 07/06/2017 1439   GLUCOSEU 50 (A) 06/17/2017 1439   HGBUR LARGE (A) 06/08/2017 1439   BILIRUBINUR NEGATIVE 07/01/2017 1439   KETONESUR NEGATIVE 07/02/2017 1439   PROTEINUR 100 (A) 06/28/2017 1439   UROBILINOGEN 0.2 07/28/2014 0736   NITRITE NEGATIVE 06/08/2017 1439   LEUKOCYTESUR LARGE (A) 06/27/2017 1439     Patryce Depriest M.D. [facility] 07/12/2017, 12:07 PM  Pager: [phone] Between 7am to 7pm - call Pager - [phone]  After 7pm go to [email or web address] - password [email or web address]  Call night coverage person covering after 7pm

## 2017-07-12 NOTE — Progress Notes (Signed)
Nutrition Consult/Folllow Up  DOCUMENTATION CODES:   Non-severe (moderate) malnutrition in context of chronic illness  INTERVENTION:    Initiate Glucerna 1.2 formula at 15 ml/hr and increase by 10 ml every 8 hours to goal rate of 65 ml/hr  TF regimen to provide 1872 kcals, 93 gm protein, 1256 ml of free water  NUTRITION DIAGNOSIS:   Malnutrition (moderate) related to chronic illness (CKD, failure to thrive) as evidenced by moderate depletion of body fat, moderate depletions of muscle mass, ongoing  GOAL:   Patient will meet greater than or equal to 90% of their needs, progressing  MONITOR:   TF tolerance, PO intake, Labs, Weight trends, Skin, I & O's  ASSESSMENT:   83 y.o. Male with a history of diabetes, hypertension who states that he has had some trouble with his vision for a couple of days. Apparently was found by his family to be confused. He was found disheveled, and soiled. He was brought into the emergency department where a head CT was performed which showed a hypodensity in the left occipital lobe. An MRI was therefore performed which shows a large left PCA territory infarct as well as multifocal other likely embolic infarcts.   Pt somnolent and not following commands. S/p EEG this AM. New left sided weakness with CT head showed acute stroke. Palliative Care Team and Internal Medicine notes reviewed.  Spoke with RN. Pt's daughter would like TF initiation. Cortrak small bore feeding tube in place. Labs reviewed. CBG's [phone].  Diet Order:  DIET - DYS 1 Room service appropriate? Yes; Fluid consistency: Thin  Skin:  Wound (see comment) (brawny skin changes with poor hygiene)  Last BM:  8/3  Height:   Ht Readings from Last 1 Encounters:  06/16/2017 6\' 4"  (1.93 m)   Weight:   Wt Readings from Last 1 Encounters:  06/07/2017 230 lb (104.3 kg)   Ideal Body Weight:  86 kg  BMI:  Body mass index is 28 kg/m.  Estimated Nutritional Needs:   Kcal:   1800-2000  Protein:  90-100 gm  Fluid:  1.8-2.0 L  EDUCATION NEEDS:   No education needs identified at this time  Maureen ChattersKatie Stephane Niemann, RD, LDN Pager #: [phone] After-Hours Pager #: [phone]

## 2017-07-12 NOTE — Progress Notes (Signed)
PT Cancellation Note  Patient Details Name: Garrett MalletJames C XXXWithers MRN: [id number] DOB: 02/08/1934   Cancelled Treatment:    Reason Eval/Treat Not Completed: Patient at procedure or test/unavailable pt getting cotrak placed at this time. Palliative care following, poor prognosis. Per RN, pt not responding. Will follow up as time allows.   Blake DivineShauna A Gentry Pilson 07/12/2017, 10:19 AM Mylo RedShauna Finch Costanzo, PT, DPT [phone]

## 2017-07-12 NOTE — Progress Notes (Signed)
STROKE TEAM PROGRESS NOTE          History Garrett Bradley Bradley is a 83 y.o. male with a history of diabetes, hypertension who states that he has had some trouble with his vision for a couple of days. Apparently was found by his family to be confused. He was found disheveled, and soiled. He was brought into the emergency department where a head CT was performed which showed a hypodensity in the left occipital lobe. An MRI was therefore performed which shows a large left PCA territory infarct as well as multifocal other likely embolic infarcts. Telemetry showed Afib with RVR (new diagnosis)   LKW: Unclear tpa given?: no, unclear time of onset   Dr Roda Shutters followed the patient earlier in his hospital course and signed off 07/07/2017. Neurology was asked to see the patient  8/5/18or increased left UE weakness that started last night. Head CT 07/09/2017 - Acute hemorrhagic infarct in the left occipital lobe similar to prior CT and MRI. New areas of acute infarct in the cerebellum bilaterally and in the right occipital lobe by CT 07/09/2017. Pt had been on ASA 325 mg daily with plans to anticoagulate secondary to new afib; however, INRs have been elevated - etiology uncertain - INR 1.70 today.   SUBJECTIVE (INTERVAL HISTORY) His daughter and grandaughter are at bedside.  Patient remains poorly responsive.he has panda tube inserted buttube feeds have not been started yet. EEG shows diffuse slowing but no definite epileptiform activity  OBJECTIVE Temp:  [98.9 F (37.2 C)-101.5 F (38.6 C)] 98.9 F (37.2 C) (08/06 1100) Pulse Rate:  [50-113] 61 (08/06 0753) Cardiac Rhythm: Other (Comment) (08/06 0700) Resp:  [17-27] 25 (08/06 1100) BP: (118-149)/(68-93) 149/68 (08/06 1100) SpO2:  [93 %-100 %] 98 % (08/06 0753)  CBC:   Recent Labs Lab 07/03/2017 1605 07/07/17 0335  WBC 10.2 8.3  NEUTROABS 8.0*  --   HGB 10.3* 8.5*  HCT 31.9* 26.6*  MCV 88.4 86.1  PLT 134* 100*    Basic Metabolic Panel:    Recent Labs Lab 07/10/17 0329 07/12/17 0236  NA 137 137  K 3.3* 3.5  CL 105 105  CO2 26 26  GLUCOSE 197* 160*  BUN 22* 25*  CREATININE 1.61* 1.86*  CALCIUM 7.7* 7.5*    Lipid Panel:     Component Value Date/Time   CHOL 101 07/06/2017 0428   TRIG 69 07/06/2017 0428   HDL 25 (L) 07/06/2017 0428   CHOLHDL 4.0 07/06/2017 0428   VLDL 14 07/06/2017 0428   LDLCALC 62 07/06/2017 0428   HgbA1c:  Lab Results  Component Value Date   HGBA1C 8.0 (H) 07/06/2017   Urine Drug Screen:     Component Value Date/Time   LABOPIA NONE DETECTED 07/06/2017 1811   COCAINSCRNUR NONE DETECTED 07/06/2017 1811   LABBENZ NONE DETECTED 07/06/2017 1811   AMPHETMU NONE DETECTED 07/06/2017 1811   THCU NONE DETECTED 07/06/2017 1811   LABBARB NONE DETECTED 07/06/2017 1811    Alcohol Level     Component Value Date/Time   ETH <5 03/16/2016 1358    IMAGING   Dg Chest 1 View 06/14/2017 The findings suggest low-grade CHF.  There is no alveolar pneumonia.    Ct Head Wo Contrast 06/12/2017  Recent appearing infarct in the left medial occipital lobe. Suspect early acute infarct in the left parietal lobe with questionable early hyperdense vessel change in the left middle cerebral artery. Given these findings, correlation with Garrett Bradley pre and post-contrast advised to further evaluate.  No mass or hemorrhage evident. No midline shift. Foci of calcification noted in each carotid siphon.   Ct Head Wo Contrast 07/09/2017  Acute hemorrhagic infarct in the left occipital lobe similar to prior CT and MRI New areas of acute infarct in the cerebellum bilaterally and in the right occipital lobe.  Findings consistent with continued posterior circulation emboli    Garrett Bradley Brain Wo Contrast (neuro Protocol) Garrett Bradley Bradley (cerebral Arteries) 2017-07-22 1. Left PCA territory acute infarct with associated diffuse petechial hemorrhage throughout the infarct site, without mass effect.  2. Occlusion of left posterior cerebral  artery at the mid P2 segment.  3. Scattered subcentimeter foci of acute ischemia within both cerebellar hemispheres, left thalamus, left anterior temporal lobe, left parietal lobe and both frontal lobes. The distribution suggests a central cardioembolic process.    Cartoid [place] 07/06/2017 B ICA 1-39% stenosis, VAs antegrade  2D Echo 07/06/2017 - Left ventricle: The cavity size was normal. There was moderate   concentric hypertrophy. Systolic function was moderately reduced.   The estimated ejection fraction was in the range of 35% to 40%.   Moderate diffuse hypokinesis with no identifiable regional   variations. - Ventricular septum: Septal motion showed abnormal function,   dyssynergy, and paradox. These changes are consistent with   intraventricular conduction delay. - Aortic valve: There was trivial regurgitation. - Mitral valve: There was mild regurgitation. - Left atrium: The atrium was mildly dilated. - Right atrium: The atrium was mildly dilated. - Pericardium, extracardiac: A trivial pericardial effusion was   identified. There was a left pleural effusion.  EEG 07/12/17 : moderate diffuse slowing of electrocerebral activity.  This can be seen in a wide variety of encephalopathic states including those of a toxic, metabolic, or degenerative nature.  The patient did, however, have occasional triphasic sharp waveforms during the EEG which would suggest a metabolic etiology.  There was no epileptiform activity on this recording.    PHYSICAL EXAM  Temp:  [98.9 F (37.2 C)-101.5 F (38.6 C)] 98.9 F (37.2 C) (08/06 1100) Pulse Rate:  [50-113] 61 (08/06 0753) Resp:  [17-27] 25 (08/06 1100) BP: (118-149)/(68-93) 149/68 (08/06 1100) SpO2:  [93 %-100 %] 98 % (08/06 0753)  General - Well nourished, well developed, lethargic barely arousable.  Ophthalmologic - Fundi not visualized due to noncooperation.  Cardiovascular - irregularly irregular heart rate and rhythm.  Neuro -  stuporose and can be barely aroused..  Eyes   open partially only to verbal, tactile, or painful stimuli.  Does not speak or follow any commands. No withdrawal to pain.  No reaction to sternal rub.  No neck stiffness.     ASSESSMENT/PLAN Garrett Bradley. Garrett Bradley Bradley is a 83 y.o. male with history of diabetes, hypertension, hypothyroidism, and history of acute kidney injury who presented with approximately two days of vision problem for a couple of days. He did not receive IV t-PA due to unknown time LKW.   Stroke: multifocal infarcts bilateral anterior and posterior infarcts, cardioembolic pattern likely due to newly recognized afib.   Resultant  Right upper quadrantanopia, lethargy  CT head: Recent L medial occipital lobe infarct, acute L parietal infarct with questionable hyperdense L MCA.  MRI head:  L PCA infarcts with diffuse petechial hemorrhage, and scattered bilateral subcentimeter acute infarcts in L thalamus, L anterior temporal lobe, and L parietal lobe and both frontal lobes  MRA head:  L P2 occlusion  Ct Head Wo Contrast 07/09/2017 Acute hemorrhagic infarct in the left occipital lobe  similar to prior CT and MRI New areas of acute infarct in the cerebellum bilaterally and in the right occipital lobe. Findings consistent with continued posterior circulation emboli  Carotid Doppler: B ICA 1-39% stenosis, VAs antegrade  2D Echo  EF 35-40%  LDL 62   HgbA1c 8.8  SCDs for VTE prophylaxis DIET - DYS 1 Room service appropriate? Yes; Fluid consistency: Thin  No antithrombotic prior to admission, now on ASA 325mg . Due to afib, we recommend anticoagulation for stroke prevention in 7 days due to moderate sized infarct and petechial hemorrhage. Once anticoagulatoin started, ASA can be discontinued. ASA on hold 2/2 acute hemorrhagic infarct in the left occipital lobe   Ongoing aggressive stroke risk factor management  Therapy recommendations:  SNF recommended  Disposition:  Pending  afib  newly recognized  EKG and tele showed afib  Cardiology on board and help appreciated  Initiate on ASA, will recommend anticoagulation in 7 days due to moderate sized infarct and petechial hemorrhage. Once anticoagulation started, ASA can be stopped from stroke standpoint.  ASA on hold 2/2 acute hemorrhagic infarct in the left occipital lobe   MRSA UTI  UA showed WBC TNTC  Urine culture positive for MRSA  On rocephin and vanco  Hx of DVT on Xarelto  Hx of DVT in 03/2016  Was on Xarelto, but reported not on Xarelto PTA  INR was 1.49 -> 1.54 -> 1.56 - etiology unclear, ?? Is pt taking Xarelto at home?? vs. Chronic elevation ??  PTT - 38 (24-36)  Hypertension  Stable  Permissive hypertension (OK if < 220/120) but gradually normalize in 5-7 days  Long-term BP goal normotensive  Hyperlipidemia  Home meds:  none  LDL 62, goal < 70  Add atorvastatin 20mg  PO daily  Continue statin at discharge  Diabetes  HgbA1c 8.8, goal < 7.0  Uncontrolled  Hyperglycemia  On levemir  Likely not taking meds at home  Other Stroke Risk Factors  Advanced age  Other Active Problems  Elevated troponin: 0.3 ->0.39 ->0.44  Elevated Cre 1.31->1.61  Noncompliance   Neglect at home??  Anemia - 8.5 / 26.6  Hypokalemia - 3.3  Thrombocytopenia - 134 -> 100 K  Acute hemorrhagic infarct in the left occipital lobe by CT 07/09/2017 (on ASA 325 mg daily -> ASA now on hold)  Elevated INRs 1.56 today  LFTs - WNL except AST 80 (Ammonia 17 -WNL)  PLAN  Palliative care consult 07/11/2017. "Family understands patient's poor prognosis, but wishes to continue current care for 2-3 days before making final decision regarding transition to full comfort measures. They do not wish artificial feeding at this time"  ASA on hold 2/2 acute hemorrhagic infarct in the left occipital lobe   Repeat Head CT 07/09/2017 as above  Hospital day # 7 I have personally examined this patient, reviewed  notes, independently viewed imaging studies, participated in medical decision making and plan of care.ROS completed by me personally and pertinent positives fully documented  I have made any additions or clarifications directly to the above note. I had a long discussion with the patient's family members at the bedside and explained that his prognosis is likely poor given his neurological exam and likely superimposed encephalopathy. Family appeared to be understanding but would like to continue medical support for the next few days and on likely not going to prefer PEG tube and nursing home placement. Start tube feeds and continue medical support for the next few days for improvement if not consider comfort care.  Greater than 50% time during this 25 minute visit was spent on counseling and coordination of care about his multiple embolic strokes, atrial fibrillation, discussion of prognosis and answering questions.  Delia Heady, MD Medical Director [facility] Pager: [phone] 07/12/2017 1:40 PM       To contact Stroke Continuity provider, please refer to [email or web address]. After hours, contact General Neurology

## 2017-07-12 NOTE — Progress Notes (Signed)
Cortrak Tube Team Note:  Consult received to place a Cortrak feeding tube.   A 10 F Cortrak tube was placed in the left nare and secured with a nasal bridle at 95 cm. Per the Cortrak monitor reading the tube tip is post-pyloric  No x-ray is required. RN may begin using tube.   If the tube becomes dislodged please keep the tube and contact the Cortrak team at [email or web address] ([email or web address]) for replacement.  If after hours and replacement cannot be delayed, place a NG tube and confirm placement with an abdominal x-ray.   Romelle Starcherate Renata Gambino MS, RD, LDN [phone] Pager  [phone] Weekend/On-Call Pager

## 2017-07-12 NOTE — Procedures (Signed)
HPI:  83 y/o with MS change and new stroke  TECHNICAL SUMMARY:  A multichannel referential and bipolar montage EEG using the standard international 10-20 system was performed on the patient described as lethargic and poorly responsive.  The dominant background activity consists of a poorly maintained 7 hertz activity seen most prominantly over the posterior head region.  Slower, 4-5 Hz activity can be seen in all head regions.    Low voltage fast (beta) activity is distributed symmetrically and maximally over the anterior head regions.  ACTIVATION:  Stepwise photic stimulation and hyperventilation are not performed.  EPILEPTIFORM ACTIVITY:  There were no spikes or paroxysmal activity.  Triphasic sharp wave forms are occasionally noted.  SLEEP:  No sleep   IMPRESSION:  This is an abnormal EEG demonstrating moderate diffuse slowing of electrocerebral activity.  This can be seen in a wide variety of encephalopathic states including those of a toxic, metabolic, or degenerative nature.  The patient did, however, have occasional triphasic sharp waveforms during the EEG which would suggest a metabolic etiology.  There was no epileptiform activity on this recording.  This does not exclude the diagnosis of a seizure disorder.  Correlate clinically.
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
## 2017-07-12 NOTE — Care Management Important Message (Signed)
Important Message  Patient Details  Name: Garrett Bradley MRN: [id number] Date ofVicki Mallet Birth: 03/06/1934   Medicare Important Message Given:  Yes    Leone Havenaylor, Miley Lindon Clinton, RN 07/12/2017, 12:35 PMImportant Message  Patient Details  Name: Garrett Bradley MRN: [id number] Date of Birth: 12/04/1934   Medicare Important Message Given:  Yes    Leone Havenaylor, Alfredo Collymore Clinton, RN 07/12/2017, 12:35 PM

## 2017-07-13 LAB — PROTIME-INR
INR: 1.79
Prothrombin Time: 21.1 seconds — ABNORMAL HIGH (ref 11.4–15.2)

## 2017-07-13 LAB — BASIC METABOLIC PANEL
Anion gap: 6 (ref 5–15)
BUN: 28 mg/dL — AB (ref 6–20)
CHLORIDE: 103 mmol/L (ref 101–111)
CO2: 26 mmol/L (ref 22–32)
CREATININE: 1.9 mg/dL — AB (ref 0.61–1.24)
Calcium: 7.4 mg/dL — ABNORMAL LOW (ref 8.9–10.3)
GFR calc Af Amer: 36 mL/min — ABNORMAL LOW (ref >60)
GFR calc non Af Amer: 31 mL/min — ABNORMAL LOW (ref >60)
Glucose, Bld: 166 mg/dL — ABNORMAL HIGH (ref 65–99)
Potassium: 3.4 mmol/L — ABNORMAL LOW (ref 3.5–5.1)
Sodium: 135 mmol/L (ref 135–145)

## 2017-07-13 LAB — GLUCOSE, CAPILLARY
GLUCOSE-CAPILLARY: 131 mg/dL — AB (ref 65–99)
GLUCOSE-CAPILLARY: 152 mg/dL — AB (ref 65–99)
GLUCOSE-CAPILLARY: 165 mg/dL — AB (ref 65–99)
Glucose-Capillary: 130 mg/dL — ABNORMAL HIGH (ref 65–99)
Glucose-Capillary: 172 mg/dL — ABNORMAL HIGH (ref 65–99)
Glucose-Capillary: 170 mg/dL — ABNORMAL HIGH (ref 65–99)
Glucose-Capillary: 154 mg/dL — ABNORMAL HIGH (ref 65–99)

## 2017-07-13 MED ORDER — INSULIN ASPART 100 UNIT/ML ~~LOC~~ SOLN
0.0000 [IU] | SUBCUTANEOUS | Status: DC
  Administered 2017-07-13: 1 [IU] via SUBCUTANEOUS
  Administered 2017-07-13 – 2017-07-14 (×2): 2 [IU] via SUBCUTANEOUS
  Administered 2017-07-14: 1 [IU] via SUBCUTANEOUS
  Administered 2017-07-14: 2 [IU] via SUBCUTANEOUS
  Administered 2017-07-14: 1 [IU] via SUBCUTANEOUS
  Administered 2017-07-14 – 2017-07-15 (×4): 2 [IU] via SUBCUTANEOUS
  Administered 2017-07-15: 1 [IU] via SUBCUTANEOUS
  Administered 2017-07-15: 2 [IU] via SUBCUTANEOUS
  Administered 2017-07-15: 1 [IU] via SUBCUTANEOUS
  Administered 2017-07-16: 2 [IU] via SUBCUTANEOUS
  Administered 2017-07-16: 1 [IU] via SUBCUTANEOUS
  Administered 2017-07-16: 2 [IU] via SUBCUTANEOUS
  Administered 2017-07-16: 1 [IU] via SUBCUTANEOUS
  Administered 2017-07-16 – 2017-07-17 (×5): 2 [IU] via SUBCUTANEOUS
  Administered 2017-07-17: 1 [IU] via SUBCUTANEOUS
  Administered 2017-07-17 – 2017-07-18 (×3): 2 [IU] via SUBCUTANEOUS
  Administered 2017-07-18: 3 [IU] via SUBCUTANEOUS
  Administered 2017-07-18 – 2017-07-19 (×4): 2 [IU] via SUBCUTANEOUS
  Administered 2017-07-21: 1 [IU] via SUBCUTANEOUS
  Administered 2017-07-22: 2 [IU] via SUBCUTANEOUS
  Administered 2017-07-22: 1 [IU] via SUBCUTANEOUS
  Administered 2017-07-22 – 2017-07-23 (×2): 2 [IU] via SUBCUTANEOUS
  Administered 2017-07-23: 1 [IU] via SUBCUTANEOUS
  Administered 2017-07-23 – 2017-07-24 (×6): 2 [IU] via SUBCUTANEOUS
  Administered 2017-07-24: 3 [IU] via SUBCUTANEOUS
  Administered 2017-07-24: 1 [IU] via SUBCUTANEOUS
  Administered 2017-07-24: 3 [IU] via SUBCUTANEOUS
  Administered 2017-07-25 (×3): 2 [IU] via SUBCUTANEOUS
  Administered 2017-07-25 (×3): 3 [IU] via SUBCUTANEOUS

## 2017-07-13 NOTE — Progress Notes (Signed)
  Speech Language Pathology Treatment: Dysphagia  Patient Details Name: Garrett MalletJames C XXXWithers MRN: [id number] DOB: 03/08/1934 Today's Date: 07/13/2017 Time: [id number] SLP Time Calculation (min) (ACUTE ONLY): 13 min  Assessment / Plan / Recommendation Clinical Impression  Pt remains poorly responsive. Utilized repositioning, face washing, hand over hand assist with cup to increase pts arousal and awareness of activities. Provided max tactile cues with ice to encourage oral manipulation with only minimal movement of lips and tongue. Anterior spillage of liquids observed. Pts eyes were opened, left hand moved slightly, one grunt from pt. Otherwise, totally unresponsive. Will continue to follow but gradual decline noted even since new stroke identified indicating poor prognosis.   HPI HPI: Charlynne PanderJames C Bradley is a 83 y.o. male with a history of diabetes, hypertension who states that he has had some trouble with his vision for a couple of days. Apparently was found by his family to be confused. He was found disheveled, and soiled. He was brought into the emergency department where a head CT was performed which showed a hypodensity in the left occipital lobe. An MRI was therefore performed which shows a large left PCA territory infarct as well as multifocal other likely embolic infarcts. SLP observed significant pocketing 8/1 perhaps due to hypoglycemia. Today copious amount eggs in oral cavity and BSE order obtained.       SLP Plan  Continue with current plan of care       Recommendations  Diet recommendations: NPO                Oral Care Recommendations: Oral care QID Follow up Recommendations: Skilled Nursing facility SLP Visit Diagnosis: Dysphagia, oral phase (R13.11) Plan: Continue with current plan of care       GO               [facility] Surgicenter LLCBonnie Afsheen Antony, MA CCC-SLP [phone]  Claudine MoutonDeBlois, Emanie Behan Caroline 07/13/2017, 9:36 AM

## 2017-07-13 NOTE — Progress Notes (Addendum)
[facility]                                                                              Patient Demographics  Garrett Bradley, is a 83 y.o. male, DOB - 05/16/1934, JXB:[id number]  Admit date - 2017/07/12   Admitting Physician [name], MD  Outpatient Primary MD for the patient is Assunta Found, MD  Outpatient specialists:   LOS - 8  days   Medical records reviewed and are as summarized below:    Chief Complaint  Patient presents with  . Failure To Thrive       Brief summary   Patient is a 83 year old male with hypothyroidism, hypertension, BPH, diabetes presented due to confusion, acute encephalopathy. Family had called EMS because patient did not answer the door. Per EMS patient was sitting in the chair, soiled in urine and stool. Patient had Hospital band on patient's arm dated December 2017 and med bottles were dated the same date and were empty. Patient reported that he had not been changed in 2 weeks. CT head on admission showed recent appearing infarct in the left medial occipital lobe, early acute infarct in the left parietal lobe. Patient was admitted for further workup.  Assessment & Plan    Principal Problem:   Acute CVA (cerebral vascular accident) (HCC) With acute metabolic encephalopathy - CT head showed recent appearing infarct in the left medial occipital lobe, early acute infarct in the left parietal lobe,  early hypodense vessel change in the left middle cerebral artery - MRI showed a left PCA territory acute infarct with associated diffuse petechial hemorrhage throughout the infarct site. Occlusion of the left posterior cerebral artery at the mid P2 segment. Scattered subcentimeter foci of acute ischemia within both cerebral hemispheres, left thalamus and left anterior temporal lobe, parietal lobe and both frontal lobes suggesting central cardioembolic process - 2-D echo showed EF 35-40%, moderate diffuse hypokinesis, intraventricular  conduction delay  - Carotid Doppler showed no significant ICA stenosis - Neurology was consulted, recommended aspirin - LDL 62, on Lipitor 20 mg daily  - Cardiology was consulted, patient seen by Dr Elease Hashimoto, per rec's ECG showed atrial fibrillation, occasional normal sinus rhythm, had stroke and will eventually need to be started on oral anticoagulation.  - Per neurology, continue aspirin 325 mg for now. Due to Atrial fibrillation, recommend anticoagulation for stroke prevention in 7 days due to moderate-sized infarct and petechial hemorrhage. Once [facility] started aspirin can be discontinued. D/w Dr Roda Shutters, neurology recommended eliquis. However if INR remains high, Coumadin will be better choice.  - on 8/3, patient noted to be having weakness more in the left upper arm, CT head was repeated which showed acute hemorrhagic infarct in the left occipital lobe, new areas of acute infarction in the cerebellum bilaterally and in the right occipital lobe.  - INR remains high, likely will need to be placed on Coumadin if PEG tube placed  Acute metabolic encephalopathy - CT on 8/3 showed acute hemorrhagic infarct in the left occipital lobe, new areas of acute infarction cerebellum bilaterally and in right occipital lobe, neurology was reconsulted - ammonia 17, likely not hepatic encephalopathy  causing lethargy, LFTs are normal - EEG showed moderate diffuse slowing of electro cerebral activity, no spikes or paroxysmal activity, no epileptiform activity on this recording -TSH was 6.2, free T4 0.9 , neurology placed patient on Synthroid 100 g daily, rule out myxedema coma   - Discussed in detail with patient's daughter at the bedside and son (on the phone at the same time) on 8/6 who requested artificial feeding tube while they're awaiting decision how to proceed.  - Placed Cortrack and initiated tube feeding for now. - Palliative team following with the family regarding goals of care   Active Problems:    Mildly  elevated troponins - Likely secondary to above, cardiology consulted, per cardiology likely due to demand ischemia - 2-D echo showed EF of 35-40% with moderate diffuse hypokinesis - No invasive workup recommended by cardiology at this time  Acute Systolic CHF - 2-D echo showed EF of 35-40%, cardiology consulted - Per cardiology poor candidate for invasive cardiac procedures, will treat with standard medical management for CHF    Failure to thrive (adult) - Patient reportedly found in feces and urine with Hospital bracelet from December 2017, his medications were not filled since December  MRSA  UTI -  patient also had indwelling Foley catheter on admission which had not been changed - Urine culture showing more than 100,000 colonies of staph aureus - Continue vancomycin, discontinued IV Rocephin  Essential hypertension - Allow for permissive hypertension given the acute stroke - will continue Cardizem for hypertension at discharge   Diabetes mellitus type 2  - DC D5 drip as patient is now started on tube feedings -Hemoglobin A1c 8.0  -Placed on SSI q4hrs    Hypothyroidism - TSH 6.2, continue Synthroid. Unclear if patient was taking his medications, does not appear to have filled after December 2017 - Synthroid restarted, repeat thyroid testing in 4-6 weeks   Moderate malnutrition - Nutrition consulted, continue supplementation  Acute kidney injury -Likely due to poor oral intake, increased IV fluids - Palliative goals of care pending   Thrush - continue Diflucan  Chronic normocytic anemia - Hemoglobin appears to be at baseline  Goals of care - Overall poor prognosis, mental status has not significantly improved, poor oral intake. New left sided weakness with CT head showing acute stroke. Discussed in detail with the patient's family, daughter, son, granddaughter at the bedside on 8/4. CODE STATUS, artificial feeding discussed with the family. Family requested Code status  to be changed to DNR. Palliative goals of care meeting done yesterday on 8/5 however family requested time and rescheduled it to Wednesday. Cortrack placed at the request of the family on 8/6  Code Status:  DNR  DVT Prophylaxis:   SCD's Family Communication: No family member present today however discussed in detail on 8/6 with the daughter and son. Disposition Plan:   Time Spent in minutes  25 minutes  Procedures:  MRI/MRA brain   2-D echo  Consultants:   Neurology  Palliative care  Antimicrobials:   IV vancomycin   Medications  Scheduled Meds: . citalopram  20 mg Oral Daily  . insulin aspart  0-5 Units Subcutaneous QHS  . insulin aspart  0-9 Units Subcutaneous TID WC  . latanoprost  1 drop Both Eyes QHS  . levothyroxine  100 mcg Intravenous Daily  . mouth rinse  15 mL Mouth Rinse BID  . senna  1 tablet Oral Daily  . silver sulfADIAZINE   Topical Daily  . tamsulosin  0.4 mg Oral  QPC supper   Continuous Infusions: . feeding supplement (GLUCERNA 1.2 CAL) 1,000 mL (07/13/17 0600)  . vancomycin Stopped (07/13/17 0722)   PRN Meds:.acetaminophen **OR** acetaminophen (TYLENOL) oral liquid 160 mg/5 mL **OR** acetaminophen, ALPRAZolam, hydrALAZINE, senna-docusate   Antibiotics   Anti-infectives    Start     Dose/Rate Route Frequency Ordered Stop   07/12/17 0600  vancomycin (VANCOCIN) IVPB 750 mg/150 ml premix     750 mg 150 mL/hr over 60 Minutes Intravenous Every 24 hours 07/11/17 1244     07/10/17 0600  vancomycin (VANCOCIN) IVPB 1000 mg/200 mL premix  Status:  Discontinued     1,000 mg 200 mL/hr over 60 Minutes Intravenous Every 24 hours 07/09/17 2332 07/11/17 1244   07/06/17 2200  vancomycin (VANCOCIN) 1,250 mg in sodium chloride 0.9 % 250 mL IVPB  Status:  Discontinued     1,250 mg 166.7 mL/hr over 90 Minutes Intravenous Every 24 hours 07/06/17 1020 07/09/17 2329   07/06/17 1000  vancomycin (VANCOCIN) IVPB 1000 mg/200 mL premix  Status:  Discontinued     1,000  mg 200 mL/hr over 60 Minutes Intravenous Every 12 hours 06/17/2017 2138 07/06/17 1017   07/06/17 1000  fluconazole (DIFLUCAN) tablet 100 mg     100 mg Oral Daily 06/16/2017 2319 07/09/17 0959   06/14/2017 2200  vancomycin (VANCOCIN) 1,500 mg in sodium chloride 0.9 % 500 mL IVPB  Status:  Discontinued     1,500 mg 250 mL/hr over 120 Minutes Intravenous  Once 06/08/2017 2135 07/06/17 1017   06/13/2017 2145  cefTRIAXone (ROCEPHIN) 1 g in dextrose 5 % 50 mL IVPB  Status:  Discontinued     1 g 100 mL/hr over 30 Minutes Intravenous Every 24 hours 06/24/2017 2144 07/09/17 1224        Subjective:   Darril Patriarca was seen and examined today. Patient remains lethargic, unresponsive not following any commands  Objective:   Vitals:   07/13/17 0428 07/13/17 0729 07/13/17 0800 07/13/17 1256  BP: 122/80 111/69 135/61 (!) 143/75  Pulse: 69 (!) 49 (!) 59 86  Resp: 12 (!) 22 17 (!) 22  Temp: 99 F (37.2 C) 99.3 F (37.4 C)  98.2 F (36.8 C)  TempSrc: Axillary Axillary  Oral  SpO2: 99% 100% 99% 97%  Weight:      Height:        Intake/Output Summary (Last 24 hours) at 07/13/17 1341 Last data filed at 07/13/17 4098  Gross per 24 hour  Intake             2268 ml  Output              600 ml  Net             1668 ml     Wt Readings from Last 3 Encounters:  07/13/17 91.6 kg (201 lb 15.1 oz)  11/26/16 106.1 kg (234 lb)  11/07/16 106.1 kg (234 lb)     Exam   General: Not following any commands, cortrack +  Eyes:  HEENT:    Cardiovascular: S1 S2 auscultated, no rubs, murmurs or gallops. Regular rate and rhythm  Respiratory: Bilateral rhonchi  Gastrointestinal: Soft, nontender, nondistended, + bowel sounds  Ext: bilateral LE wrapped  Neuro: not following any commands  Musculoskeletal: No digital cyanosis, clubbing  Skin: Bilateral lower extremity wrapped  Psych: not following any commands   Data Reviewed:  I have personally reviewed following labs and imaging studies  Micro  Results Recent Results (from the  past 240 hour(s))  Urine culture     Status: Abnormal   Collection Time: 06/12/2017  2:39 PM  Result Value Ref Range Status   Specimen Description URINE, RANDOM  Final   Special Requests NONE  Final   Culture (A)  Final    >=100,000 COLONIES/mL METHICILLIN RESISTANT STAPHYLOCOCCUS AUREUS   Report Status 07/08/2017 FINAL  Final   Organism ID, Bacteria METHICILLIN RESISTANT STAPHYLOCOCCUS AUREUS (A)  Final      Susceptibility   Methicillin resistant staphylococcus aureus - MIC*    CIPROFLOXACIN >=8 RESISTANT Resistant     GENTAMICIN <=0.5 SENSITIVE Sensitive     NITROFURANTOIN <=16 SENSITIVE Sensitive     OXACILLIN >=4 RESISTANT Resistant     TETRACYCLINE <=1 SENSITIVE Sensitive     VANCOMYCIN 1 SENSITIVE Sensitive     TRIMETH/SULFA <=10 SENSITIVE Sensitive     CLINDAMYCIN <=0.25 SENSITIVE Sensitive     RIFAMPIN <=0.5 SENSITIVE Sensitive     Inducible Clindamycin NEGATIVE Sensitive     * >=100,000 COLONIES/mL METHICILLIN RESISTANT STAPHYLOCOCCUS AUREUS  MRSA PCR Screening     Status: Abnormal   Collection Time: 07/06/17  1:00 AM  Result Value Ref Range Status   MRSA by PCR POSITIVE (A) NEGATIVE Final    Comment:        The GeneXpert MRSA Assay (FDA approved for NASAL specimens only), is one component of a comprehensive MRSA colonization surveillance program. It is not intended to diagnose MRSA infection nor to guide or monitor treatment for MRSA infections. RESULT CALLED TO, READ BACK BY AND VERIFIED WITH: M. Lucretia Roers [phone] 07.31.2018 N. MORRIS     Radiology Reports Dg Chest 1 View  Result Date: 06/25/2017 CLINICAL DATA:  Mental status change, incontinence. EXAM: CHEST 1 VIEW COMPARISON:  Chest x-ray of March 16, 2016 FINDINGS: The lungs are adequately inflated. The interstitial markings are mildly increased. There is no alveolar infiltrate or pleural effusion. The heart is top-normal in size. The central pulmonary vascularity is mildly prominent.  There are degenerative changes of both shoulders. IMPRESSION: The findings suggest low-grade CHF.  There is no alveolar pneumonia. Electronically Signed   By: David  [place] M.D.   On: 07/04/2017 15:41   Ct Head Wo Contrast  Result Date: 07/09/2017 CLINICAL DATA:  Stroke followup EXAM: CT HEAD WITHOUT CONTRAST TECHNIQUE: Contiguous axial images were obtained from the base of the skull through the vertex without intravenous contrast. COMPARISON:  CT and MRI 06/19/2017 FINDINGS: Brain: Acute infarct left posterior cerebral artery territory involving the left occipital lobe and posterior thalamus. There is mild amount of hemorrhage within the infarct, similar to the prior CT and MRI. New areas of hypodensity in the cerebellum bilaterally compatible with acute infarct. No hemorrhage in the posterior fossa. New area of hypodensity right occipital lobe compatible with acute infarct without hemorrhage. Generalized atrophy. Negative for hydrocephalus. Chronic white matter disease. Chronic left frontal infarct. No shift of the midline structures. Vascular: Negative for hyperdense vessel Skull: Negative Sinuses/Orbits: Negative Other: None IMPRESSION: Acute hemorrhagic infarct in the left occipital lobe similar to prior CT and MRI New areas of acute infarct in the cerebellum bilaterally and in the right occipital lobe. Findings consistent with continued posterior circulation emboli These results will be called to the ordering clinician or representative by the Radiologist Assistant, and communication documented in the PACS or zVision Dashboard. Electronically Signed   By: Marlan Palau M.D.   On: 07/09/2017 16:50   Ct Head Wo Contrast  Result Date: 06/26/2017 CLINICAL  DATA:  Confusion with incontinence EXAM: CT HEAD WITHOUT CONTRAST TECHNIQUE: Contiguous axial images were obtained from the base of the skull through the vertex without intravenous contrast. COMPARISON:  July 26, 2014 FINDINGS: Brain: There is mild  diffuse atrophy. There is a recent appearing infarct in the medial left occipital lobe with decreased attenuation. There is sulcal effacement in the posterior left parietal lobe, potentially a second focus of early infarct. There is no edema in this area appreciable. There is no mass, hemorrhage, or extra-axial fluid collection. No midline shift. Elsewhere, there is slight small vessel disease in the centra semiovale bilaterally. Vascular: There is subtle increased attenuation in the proximal left middle cerebral artery compared to prior study. This appearance may represent early hyperdense vessel and may be indicative of early acute infarct in the left middle cerebral artery distribution. There is calcification in each carotid siphon region. Skull: Bony calvarium appears intact. Sinuses/Orbits: Visualized paranasal sinuses are clear. Visualized orbits appear symmetric bilaterally. Other: Mastoid air cells are clear. IMPRESSION: Recent appearing infarct in the left medial occipital lobe. Suspect early acute infarct in the left parietal lobe with questionable early hyperdense vessel change in the left middle cerebral artery. Given these findings, correlation with MR pre and post-contrast advised to further evaluate. No mass or hemorrhage evident. No midline shift. Foci of calcification noted in each carotid siphon. Electronically Signed   By: Bretta Bang III M.D.   On: 2017-07-15 16:04   Mr Brain Wo Contrast (neuro Protocol)  Result Date: 15-Jul-2017 CLINICAL DATA:  Altered mental status and weakness EXAM: MRI HEAD WITHOUT CONTRAST MRA HEAD WITHOUT CONTRAST TECHNIQUE: Multiplanar, multiecho pulse sequences of the brain and surrounding structures were obtained without intravenous contrast. Angiographic images of the head were obtained using MRA technique without contrast. COMPARISON:  None. FINDINGS: MRI HEAD FINDINGS Brain: The midline structures are normal. There is a large area of diffusion restriction  within the left posterior cerebral artery distribution. There are scattered other small foci of diffusion restriction within both cerebellar hemispheres, the left thalamus and anterior temporal lobe and multiple sites in the left parietal lobe and both frontal lobes. There is multifocal hyperintense T2-weighted signal within the periventricular white matter, most often seen in the setting of chronic microvascular ischemia. No mass lesion. There is petechial hemorrhage throughout the left PCA distribution. No hydrocephalus, age advanced atrophy or lobar predominant volume loss. No dural abnormality or extra-axial collection. Skull and upper cervical spine: The visualized skull base, calvarium, upper cervical spine and extracranial soft tissues are normal. Sinuses/Orbits: No fluid levels or advanced mucosal thickening. No mastoid effusion. Normal orbits. MRA HEAD FINDINGS Intracranial internal carotid arteries: Normal. Anterior cerebral arteries: Normal. Middle cerebral arteries: Normal. Posterior communicating arteries: Present on the right. Posterior cerebral arteries: The left PCA is occluded in its mid P2 segment. The right PCA is normal. Basilar artery: Normal. Vertebral arteries: Codominant Normal. Superior cerebellar arteries: Normal. Anterior inferior cerebellar arteries: Normal. Posterior inferior cerebellar arteries: Normal. IMPRESSION: 1. Left PCA territory acute infarct with associated diffuse petechial hemorrhage throughout the infarct site, without mass effect. 2. Occlusion of left posterior cerebral artery at the mid P2 segment. 3. Scattered subcentimeter foci of acute ischemia within both cerebellar hemispheres, left thalamus, left anterior temporal lobe, left parietal lobe and both frontal lobes. The distribution suggests a central cardioembolic process. Electronically Signed   By: Deatra Robinson M.D.   On: 07-15-2017 17:46   Mr Maxine Glenn Head (cerebral Arteries)  Result Date: 07-15-2017 CLINICAL DATA:  Altered mental status and weakness EXAM: MRI HEAD WITHOUT CONTRAST MRA HEAD WITHOUT CONTRAST TECHNIQUE: Multiplanar, multiecho pulse sequences of the brain and surrounding structures were obtained without intravenous contrast. Angiographic images of the head were obtained using MRA technique without contrast. COMPARISON:  None. FINDINGS: MRI HEAD FINDINGS Brain: The midline structures are normal. There is a large area of diffusion restriction within the left posterior cerebral artery distribution. There are scattered other small foci of diffusion restriction within both cerebellar hemispheres, the left thalamus and anterior temporal lobe and multiple sites in the left parietal lobe and both frontal lobes. There is multifocal hyperintense T2-weighted signal within the periventricular white matter, most often seen in the setting of chronic microvascular ischemia. No mass lesion. There is petechial hemorrhage throughout the left PCA distribution. No hydrocephalus, age advanced atrophy or lobar predominant volume loss. No dural abnormality or extra-axial collection. Skull and upper cervical spine: The visualized skull base, calvarium, upper cervical spine and extracranial soft tissues are normal. Sinuses/Orbits: No fluid levels or advanced mucosal thickening. No mastoid effusion. Normal orbits. MRA HEAD FINDINGS Intracranial internal carotid arteries: Normal. Anterior cerebral arteries: Normal. Middle cerebral arteries: Normal. Posterior communicating arteries: Present on the right. Posterior cerebral arteries: The left PCA is occluded in its mid P2 segment. The right PCA is normal. Basilar artery: Normal. Vertebral arteries: Codominant Normal. Superior cerebellar arteries: Normal. Anterior inferior cerebellar arteries: Normal. Posterior inferior cerebellar arteries: Normal. IMPRESSION: 1. Left PCA territory acute infarct with associated diffuse petechial hemorrhage throughout the infarct site, without mass effect. 2.  Occlusion of left posterior cerebral artery at the mid P2 segment. 3. Scattered subcentimeter foci of acute ischemia within both cerebellar hemispheres, left thalamus, left anterior temporal lobe, left parietal lobe and both frontal lobes. The distribution suggests a central cardioembolic process. Electronically Signed   By: Deatra Robinson M.D.   On: 07/14/17 17:46    Lab Data:  CBC:  Recent Labs Lab 07/07/17 0335  WBC 8.3  HGB 8.5*  HCT 26.6*  MCV 86.1  PLT 100*   Basic Metabolic Panel:  Recent Labs Lab 07/07/17 0335 07/09/17 0335 07/10/17 0329 07/12/17 0236 07/13/17 0305  NA 138 139 137 137 135  K 3.0* 3.6 3.3* 3.5 3.4*  CL 104 105 105 105 103  CO2 29 26 26 26 26   GLUCOSE 67 174* 197* 160* 166*  BUN 20 17 22* 25* 28*  CREATININE 1.21 1.43* 1.61* 1.86* 1.90*  CALCIUM 7.7* 8.0* 7.7* 7.5* 7.4*   GFR: Estimated Creatinine Clearance: 37.4 mL/min (A) (by C-G formula based on SCr of 1.9 mg/dL (H)). Liver Function Tests:  Recent Labs Lab 07/10/17 1619  AST 80*  ALT 25  ALKPHOS 68  BILITOT 0.7  PROT 7.8  ALBUMIN 1.6*   No results for input(s): LIPASE, AMYLASE in the last 168 hours.  Recent Labs Lab 07/10/17 1619  AMMONIA 17   Coagulation Profile:  Recent Labs Lab 07/09/17 0335 07/10/17 0329 07/10/17 1619 07/12/17 0236 07/13/17 0305  INR 1.64 1.70 1.56 1.69 1.79   Cardiac Enzymes:  Recent Labs Lab 07/06/17 1846  CKTOTAL 41*   BNP (last 3 results) No results for input(s): PROBNP in the last 8760 hours. HbA1C: No results for input(s): HGBA1C in the last 72 hours. CBG:  Recent Labs Lab 07/12/17 2206 07/13/17 0058 07/13/17 0425 07/13/17 0737 07/13/17 1255  GLUCAP 130* 152* 165* 172* 170*   Lipid Profile: No results for input(s): CHOL, HDL, LDLCALC, TRIG, CHOLHDL, LDLDIRECT in the last 72 hours. Thyroid  Function Tests: No results for input(s): TSH, T4TOTAL, FREET4, T3FREE, THYROIDAB in the last 72 hours. Anemia Panel: No results for  input(s): VITAMINB12, FOLATE, FERRITIN, TIBC, IRON, RETICCTPCT in the last 72 hours. Urine analysis:    Component Value Date/Time   COLORURINE YELLOW 06/15/2017 1439   APPEARANCEUR CLOUDY (A) 06/12/2017 1439   LABSPEC 1.015 06/10/2017 1439   PHURINE 6.0 06/16/2017 1439   GLUCOSEU 50 (A) 06/06/2017 1439   HGBUR LARGE (A) 06/13/2017 1439   BILIRUBINUR NEGATIVE 06/16/2017 1439   KETONESUR NEGATIVE 06/24/2017 1439   PROTEINUR 100 (A) 06/21/2017 1439   UROBILINOGEN 0.2 07/28/2014 0736   NITRITE NEGATIVE 06/17/2017 1439   LEUKOCYTESUR LARGE (A) 07/02/2017 1439     Lakechia Nay M.D. [facility] 07/13/2017, 1:41 PM  Pager: [phone] Between 7am to 7pm - call Pager - [phone]  After 7pm go to [email or web address] - password [email or web address]  Call night coverage person covering after 7pm

## 2017-07-13 NOTE — Progress Notes (Signed)
Pharmacy Antibiotic Note Garrett Bradley is a 83 y.o. male who presented on 7/30 with AMS and was found to have new CVAs. The patient was found to have a MRSA UTI in the setting of a chronic foley. Currently on day 8 of vancomycin for treatment.   Plan: 1. Continue Vancomycin to 750 mg IV every 24 hours for now 2. Consider stopping vancomycin as pt has completed 8 days of treatment for UTI 3. Continue daily BMP while on vancomycin as SCr continues to slightly trend upwards and may require another dose/frequency adjustment if continued   Height: 6\' 4"  (193 cm) Weight: 201 lb 15.1 oz (91.6 kg) IBW/kg (Calculated) : 86.8  Temp (24hrs), Avg:98.8 F (37.1 C), Min:97.9 F (36.6 C), Max:99.3 F (37.4 C)   Recent Labs Lab 07/07/17 0335 07/09/17 0335 07/09/17 2228 07/10/17 0329 07/12/17 0236 07/13/17 0305  WBC 8.3  --   --   --   --   --   CREATININE 1.21 1.43*  --  1.61* 1.86* 1.90*  VANCOTROUGH  --   --  22*  --   --   --     Estimated Creatinine Clearance: 37.4 mL/min (A) (by C-G formula based on SCr of 1.9 mg/dL (H)).    Allergies  Allergen Reactions  . Thiazide-Type Diuretics Other (See Comments)    03/2016 acute wrist pain, presumed gout Uric acid 10.4    Thank you for allowing pharmacy to be a part of this patient's care.  Pollyann SamplesAndy Khristi Schiller, PharmD, BCPS 07/13/2017, 8:39 AM

## 2017-07-13 NOTE — Plan of Care (Signed)
Problem: Health Behavior/Discharge Planning: Goal: Ability to manage health-related needs will improve Outcome: Progressing Patient's family working to make decisions regarding care. Working closely with palliative care at this time.   Problem: Skin Integrity: Goal: Risk for impaired skin integrity will decrease Outcome: Progressing Patient had blisters on bottom due to MASD that have ruptured. Patient's skin very sensitive and fragile. Foam placed over sacral area and offloading being utilized.   Problem: Activity: Goal: Risk for activity intolerance will decrease Outcome: Completed/Met Date Met: 07/13/17 Patient unresponsive and unable to make any position changes at this time.   Problem: Nutrition: Goal: Adequate nutrition will be maintained Outcome: Progressing Patient had cortrak placed and tube feedings started. Working to increase to goal of 65. Increasing by 10 every 8 hours.   Problem: Bowel/Gastric: Goal: Will not experience complications related to bowel motility Outcome: Progressing Patient now on tube feedings after being NPO for an extended amount of time. Will monitor for BM's.

## 2017-07-13 NOTE — Progress Notes (Signed)
STROKE TEAM PROGRESS NOTE    SUBJECTIVE (INTERVAL HISTORY) He is alone in the room.  The patient is awake, with left-sided neglect, and a rightward gaze.  He is unresponsive to verbal commands and weakly withdrawing from painful stimuli.   On exam today family is not present in the room.  Current DNR order in place.       OBJECTIVE Temp:  [97.9 F (36.6 C)-99.3 F (37.4 C)] 99.3 F (37.4 C) (08/07 0729) Pulse Rate:  [34-91] 59 (08/07 0800) Cardiac Rhythm: Normal sinus rhythm;Bundle branch block (08/07 0730) Resp:  [12-24] 17 (08/07 0800) BP: (111-139)/(55-91) 135/61 (08/07 0800) SpO2:  [98 %-100 %] 99 % (08/07 0800) Weight:  [91.6 kg (201 lb 15.1 oz)-94.6 kg (208 lb 8.9 oz)] 91.6 kg (201 lb 15.1 oz) (08/07 0313)  CBC:   Recent Labs Lab 07/07/17 0335  WBC 8.3  HGB 8.5*  HCT 26.6*  MCV 86.1  PLT 100*    Basic Metabolic Panel:   Recent Labs Lab 07/12/17 0236 07/13/17 0305  NA 137 135  K 3.5 3.4*  CL 105 103  CO2 26 26  GLUCOSE 160* 166*  BUN 25* 28*  CREATININE 1.86* 1.90*  CALCIUM 7.5* 7.4*    Lipid Panel:     Component Value Date/Time   CHOL 101 07/06/2017 0428   TRIG 69 07/06/2017 0428   HDL 25 (L) 07/06/2017 0428   CHOLHDL 4.0 07/06/2017 0428   VLDL 14 07/06/2017 0428   LDLCALC 62 07/06/2017 0428   HgbA1c:  Lab Results  Component Value Date   HGBA1C 8.0 (H) 07/06/2017   Urine Drug Screen:     Component Value Date/Time   LABOPIA NONE DETECTED 07/06/2017 1811   COCAINSCRNUR NONE DETECTED 07/06/2017 1811   LABBENZ NONE DETECTED 07/06/2017 1811   AMPHETMU NONE DETECTED 07/06/2017 1811   THCU NONE DETECTED 07/06/2017 1811   LABBARB NONE DETECTED 07/06/2017 1811    Alcohol Level     Component Value Date/Time   ETH <5 03/16/2016 1358    IMAGING   Dg Chest 1 View 07/04/2017 The findings suggest low-grade CHF.  There is no alveolar pneumonia.    Ct Head Wo Contrast 06/26/2017  Recent appearing infarct in the left medial occipital lobe.  Suspect early acute infarct in the left parietal lobe with questionable early hyperdense vessel change in the left middle cerebral artery. Given these findings, correlation with MR pre and post-contrast advised to further evaluate. No mass or hemorrhage evident. No midline shift. Foci of calcification noted in each carotid siphon.   Ct Head Wo Contrast 07/09/2017  Acute hemorrhagic infarct in the left occipital lobe similar to prior CT and MRI New areas of acute infarct in the cerebellum bilaterally and in the right occipital lobe.  Findings consistent with continued posterior circulation emboli    Mr Brain Wo Contrast (neuro Protocol) Mr Garrett LatchMra Head (cerebral Arteries) 07/03/2017 1. Left PCA territory acute infarct with associated diffuse petechial hemorrhage throughout the infarct site, without mass effect.  2. Occlusion of left posterior cerebral artery at the mid P2 segment.  3. Scattered subcentimeter foci of acute ischemia within both cerebellar hemispheres, left thalamus, left anterior temporal lobe, left parietal lobe and both frontal lobes. The distribution suggests a central cardioembolic process.   Cartoid US 07/06/2017 B ICA 1-39% stenosis, VAs antegrade  2D Echo 07/06/2017 - Left ventricle: The cavity size was normal. There was moderate   concentric hypertrophy. Systolic function was moderately reduced.   The estimated ejection  fraction was in the range of 35% to 40%.   Moderate diffuse hypokinesis with no identifiable regional   variations. - Ventricular septum: Septal motion showed abnormal function,   dyssynergy, and paradox. These changes are consistent with   intraventricular conduction delay. - Aortic valve: There was trivial regurgitation. - Mitral valve: There was mild regurgitation. - Left atrium: The atrium was mildly dilated. - Right atrium: The atrium was mildly dilated. - Pericardium, extracardiac: A trivial pericardial effusion was   identified. There was a  left pleural effusion.  EEG 07/12/17 : moderate diffuse slowing of electrocerebral activity.  This can be seen in a wide variety of encephalopathic states including those of a toxic, metabolic, or degenerative nature.  The patient did, however, have occasional triphasic sharp waveforms during the EEG which would suggest a metabolic etiology.  There was no epileptiform activity on this recording.    PHYSICAL EXAM  Temp:  [97.9 F (36.6 C)-99.3 F (37.4 C)] 99.3 F (37.4 C) (08/07 0729) Pulse Rate:  [34-91] 59 (08/07 0800) Resp:  [12-24] 17 (08/07 0800) BP: (111-139)/(55-91) 135/61 (08/07 0800) SpO2:  [98 %-100 %] 99 % (08/07 0800) Weight:  [91.6 kg (201 lb 15.1 oz)-94.6 kg (208 lb 8.9 oz)] 91.6 kg (201 lb 15.1 oz) (08/07 0313)  General - Well nourished, well developed, lethargic barely arousable.  Ophthalmologic - Fundi not visualized due to noncooperation.  Cardiovascular - irregularly irregular heart rate and rhythm.  Neuro - stuporous and can be barely aroused.  Eyes open partially only to verbal, tactile, or painful stimuli.  Does not speak or follow any commands. No withdrawal to pain.  No reaction to sternal rub.  No neck stiffness.     ASSESSMENT/PLAN Garrett Bradley is a 84 y.o. male with history of diabetes, hypertension, hypothyroidism, and history of acute kidney injury who presented with approximately two days of vision problem for a couple of days. He did not receive IV t-PA due to unknown time LKW.   Stroke: multifocal infarcts bilateral anterior and posterior infarcts, cardioembolic pattern likely due to newly recognized afib.   Resultant  Right upper quadrantanopia, lethargy  CT head: Recent L medial occipital lobe infarct, acute L parietal infarct with questionable hyperdense L MCA.  MRI head:  L PCA infarcts with diffuse petechial hemorrhage, and scattered bilateral subcentimeter acute infarcts in L thalamus, L anterior temporal lobe, and L parietal lobe and  both frontal lobes  MRA head:  L P2 occlusion  Ct Head Wo Contrast 07/09/2017 Acute hemorrhagic infarct in the left occipital lobe similar to prior CT and MRI New areas of acute infarct in the cerebellum bilaterally and in the right occipital lobe. Findings consistent with continued posterior circulation emboli  Carotid Doppler: B ICA 1-39% stenosis, VAs antegrade  2D Echo  EF 35-40%  LDL 62   HgbA1c 8.8  SCDs for VTE prophylaxis DIET - DYS 1 Room service appropriate? Yes; Fluid consistency: Thin  No antithrombotic prior to admission, now on ASA 325mg . Due to afib, we recommend anticoagulation for stroke prevention in 7 days due to moderate sized infarct and petechial hemorrhage. Once anticoagulatoin started, ASA can be discontinued. ASA on hold 2/2 acute hemorrhagic infarct in the left occipital lobe   Ongoing aggressive stroke risk factor management  Therapy recommendations:  SNF recommended  Disposition:  Pending  afib newly recognized  EKG and tele showed afib  Cardiology on board and help appreciated  Initiate on ASA, will recommend anticoagulation in 7 days due  to moderate sized infarct and petechial hemorrhage. Once anticoagulation started, ASA can be stopped from stroke standpoint.  ASA on hold 2/2 acute hemorrhagic infarct in the left occipital lobe   MRSA UTI  UA showed WBC TNTC  Urine culture positive for MRSA  On rocephin and vanco  Hx of DVT on Xarelto  Hx of DVT in 03/2016  Was on Xarelto, but reported not on Xarelto PTA  INR was 1.49 -> 1.54 -> 1.56 - etiology unclear, ?? Is pt taking Xarelto at home?? vs. Chronic elevation ??  PTT - 38 (24-36)  Hypertension  Stable  Permissive hypertension (OK if < 220/120) but gradually normalize in 5-7 days  Long-term BP goal normotensive  Hyperlipidemia  Home meds:  none  LDL 62, goal < 70  Add atorvastatin 20mg  PO daily  Continue statin at discharge  Diabetes  HgbA1c 8.8, goal <  7.0  Uncontrolled  Hyperglycemia  On levemir  Likely not taking meds at home  Other Stroke Risk Factors  Advanced age  Other Active Problems  Elevated troponin: 0.3 ->0.39 ->0.44  Elevated Cre 1.31->1.61  Noncompliance   Neglect at home??  Anemia - 8.5 / 26.6  Hypokalemia - 3.3  Thrombocytopenia - 134 -> 100 K  Acute hemorrhagic infarct in the left occipital lobe by CT 07/09/2017 (on ASA 325 mg daily -> ASA now on hold)  Elevated INRs 1.56 today  LFTs - WNL except AST 80 (Ammonia 17 -WNL)  PLAN  Palliative care consult 07/11/2017. "Family understands patient's poor prognosis, but wishes to continue current care for 2-3 days before making final decision regarding transition to full comfort measures. They do not wish artificial feeding at this time"  ASA on hold 2/2 acute hemorrhagic infarct in the left occipital lobe   Repeat Head CT 07/09/2017 as above  Hospital day # 8 I have personally examined this patient, reviewed notes, independently viewed imaging studies, participated in medical decision making and plan of care.ROS completed by me personally and pertinent positives fully documented  I have made any additions or clarifications directly to the above note. I had a long discussion with the patient's family members at the bedside and explained that his prognosis is likely poor given his neurological exam and likely superimposed encephalopathy. Family appeared to be understanding but would like to continue medical support for the next few days and on likely not going to prefer PEG tube and nursing home placement. Continue tube feeds and   medical support for the next few days for improvement if not consider comfort care.   Delia Heady, MD Medical Director [facility] Pager: [phone] 07/13/2017 11:37 AM       To contact Stroke Continuity provider, please refer to [email or web address]. After hours, contact General Neurology

## 2017-07-14 LAB — PROTIME-INR
INR: 1.62
Prothrombin Time: 19.5 seconds — ABNORMAL HIGH (ref 11.4–15.2)

## 2017-07-14 LAB — BASIC METABOLIC PANEL
Anion gap: 6 (ref 5–15)
BUN: 34 mg/dL — AB (ref 6–20)
CHLORIDE: 104 mmol/L (ref 101–111)
CO2: 28 mmol/L (ref 22–32)
Calcium: 7.8 mg/dL — ABNORMAL LOW (ref 8.9–10.3)
Creatinine, Ser: 1.91 mg/dL — ABNORMAL HIGH (ref 0.61–1.24)
GFR calc Af Amer: 36 mL/min — ABNORMAL LOW (ref >60)
GFR calc non Af Amer: 31 mL/min — ABNORMAL LOW (ref >60)
GLUCOSE: 158 mg/dL — AB (ref 65–99)
POTASSIUM: 3.8 mmol/L (ref 3.5–5.1)
Sodium: 138 mmol/L (ref 135–145)

## 2017-07-14 LAB — CBC
HEMATOCRIT: 29.2 % — AB (ref 39.0–52.0)
Hemoglobin: 9.3 g/dL — ABNORMAL LOW (ref 13.0–17.0)
MCH: 27.4 pg (ref 26.0–34.0)
MCHC: 31.8 g/dL (ref 30.0–36.0)
MCV: 85.9 fL (ref 78.0–100.0)
Platelets: 91 10*3/uL — ABNORMAL LOW (ref 150–400)
RBC: 3.4 MIL/uL — ABNORMAL LOW (ref 4.22–5.81)
RDW: 14.4 % (ref 11.5–15.5)
WBC: 11.8 10*3/uL — ABNORMAL HIGH (ref 4.0–10.5)

## 2017-07-14 LAB — GLUCOSE, CAPILLARY
GLUCOSE-CAPILLARY: 172 mg/dL — AB (ref 65–99)
GLUCOSE-CAPILLARY: 166 mg/dL — AB (ref 65–99)
GLUCOSE-CAPILLARY: 171 mg/dL — AB (ref 65–99)
GLUCOSE-CAPILLARY: 146 mg/dL — AB (ref 65–99)
Glucose-Capillary: 109 mg/dL — ABNORMAL HIGH (ref 65–99)
Glucose-Capillary: 133 mg/dL — ABNORMAL HIGH (ref 65–99)

## 2017-07-14 NOTE — Progress Notes (Signed)
Daily Progress Note   Patient Name: Garrett Bradley       Date: 07/14/2017 DOB: 11/03/1934  Age: 83 y.o. MRN#: [id number] Attending Physician: Randel PiggSilva Zapata, Dorma RussellEdwin, MD Primary Care Physician: [name], [name], MD Admit Date: 2017-07-18  Reason for Consultation/Follow-up: Establishing goals of care  Subjective: Patient in bed, continues to be unresponsive. No family at bedside.  Spoke with patient's daughter, Garrett Bradley via telephone.  Again explained different paths of care r/t continued aggressive medical interventions and transition to comfort care.  We discussed that even with continued aggressive care (IV antibiotics, PEG placement, SNF)- patient is very unlikely to have any meaningful functional status.  Garrett Bradley is realistic and notes that patient would not want to live this way. She is in [place] today. Garrett Bradley states she and family are "leaning towards Hospice". She plans to discuss with her brother, Garrett Bradley, tonight and contact providers with their final decisions tomorrow.   Review of Systems  Unable to perform ROS: Patient unresponsive    Length of Stay: 9  Current Medications: Scheduled Meds:  . citalopram  20 mg Oral Daily  . insulin aspart  0-9 Units Subcutaneous Q4H  . latanoprost  1 drop Both Eyes QHS  . levothyroxine  100 mcg Intravenous Daily  . mouth rinse  15 mL Mouth Rinse BID  . senna  1 tablet Oral Daily  . silver sulfADIAZINE   Topical Daily  . tamsulosin  0.4 mg Oral QPC supper    Continuous Infusions: . feeding supplement (GLUCERNA 1.2 CAL) 1,000 mL (07/14/17 1225)  . vancomycin Stopped (07/14/17 0650)    PRN Meds: acetaminophen **OR** acetaminophen (TYLENOL) oral liquid 160 mg/5 mL **OR** acetaminophen, ALPRAZolam, hydrALAZINE, senna-docusate  Physical Exam   Cardiovascular: Normal rate.   Pulmonary/Chest: Effort normal.  Neurological:  Nonresponsive  Nursing note and vitals reviewed.           Vital Signs: BP 120/74 (BP Location: Left Arm)   Pulse (!) 53   Temp 98.4 F (36.9 C) (Oral)   Resp (!) 23   Ht 6\' 4"  (1.93 m)   Wt 95.9 kg (211 lb 6.7 oz)   SpO2 99%   BMI 25.74 kg/m  SpO2: SpO2: 99 % O2 Device: O2 Device: Not Delivered O2 Flow Rate:    Intake/output summary:  Intake/Output Summary (Last 24 hours)  at 07/14/17 1354 Last data filed at 07/14/17 0700  Gross per 24 hour  Intake           1202.5 ml  Output              800 ml  Net            402.5 ml   LBM: Last BM Date: 07/12/17 (smear) Baseline Weight: Weight: 104.3 kg (230 lb) Most recent weight: Weight: 95.9 kg (211 lb 6.7 oz)       Palliative Assessment/Data: PPS: 10%     Patient Active Problem List   Diagnosis Date Noted  . Goals of care, counseling/discussion   . Advance care planning   . Palliative care encounter   . Elevated INR   . Failure to thrive (0-17)   . CVA (cerebral vascular accident) (HCC) 2017-07-09  . New onset atrial fibrillation (HCC) 07/09/2017  . Elevated troponin 2017-07-09  . Hypokalemia 06/18/2016  . Scrotal swelling   . Acute kidney injury (HCC)   . DVT (deep venous thrombosis) (HCC) 04/13/2016  . Diastolic dysfunction 04/13/2016  . Edema 04/07/2016  . Wrist pain, right 03/24/2016  . Adult failure to thrive 03/24/2016  . Orthostatic hypotension 03/20/2016  . Protein-calorie malnutrition, severe 03/17/2016  . Dehydration 03/16/2016  . Hypernatremia 03/16/2016  . Diabetes mellitus type 2 with complications (HCC) 03/16/2016  . Depression 03/16/2016  . Thrombocytopenia, unspecified (HCC) 07/27/2014  . Lower urinary tract infectious disease 06/24/2014  . CKD (chronic kidney disease) stage 3, GFR 30-59 ml/min 08/06/2013  . Chronic indwelling Foley catheter 08/06/2013  . Thrush of mouth and esophagus (HCC) 10/02/2011  .  Hypertension 10/02/2011  . Hypothyroidism 09/24/2011  . Anemia 09/24/2011  . BPH (benign prostatic hyperplasia) 09/24/2011    Palliative Care Assessment & Plan   Patient Profile: 83 y.o.malewith past medical history of hypothyroidism, HTN, BPH, DMadmitted on 03-Aug-2018after being found by his family in his home, unable to get up and answer the door, sitting in a chair covered in urine and stool. He had an indwelling foley from December that has not been changed. Multiple medication bottles from December that had not been refilled were found at the bedside. APS consulted in ED. Workup revealed altered mental status, elevated BNP, mild CHF, and MRI showing diffuse petechial hemorrhage and cardioembolic CVA. Palliative medicine consulted for GOC.  Assessment/Recommendations/Plan   Continue current level of care  Family leaning towards Hospice, in final discussions with plan for final decisions tomorrow  Renee Harder PMT team phone number for contact re: questions, concerns  Goals of Care and Additional Recommendations:  Limitations on Scope of Treatment: Full Scope Treatment  Code Status:  DNR  Prognosis:   Unable to determine  Discharge Planning:  To Be Determined  Care plan was discussed with patient's daughter, Garrett Glenn.  Thank you for allowing the Palliative Medicine Team to assist in the care of this patient.   Total time: 45 minutes  Greater than 50%  of this time was spent counseling and coordinating care related to the above assessment and plan.  Ocie Bob, AGNP-C Palliative Medicine   Please contact Palliative Medicine Team phone at [phone] for questions and concerns.

## 2017-07-14 NOTE — Progress Notes (Signed)
STROKE TEAM PROGRESS NOTE    SUBJECTIVE (INTERVAL HISTORY) He is alone in the room.  The patient is awake, with left-sided neglect, and a rightward gaze.  He is unresponsive to verbal commands and weakly withdrawing from painful stimuli.   On exam today family is not present in the room.  Discussed . They are yet undecided about PEG tube and nursing home placementwith nurse. Family wants to support him for a few days OBJECTIVE Temp:  [98.2 F (36.8 C)-100.6 F (38.1 C)] 98.4 F (36.9 C) (08/08 1200) Pulse Rate:  [32-134] 53 (08/08 1200) Cardiac Rhythm: Normal sinus rhythm;Bundle branch block (08/08 1200) Resp:  [11-29] 23 (08/08 1200) BP: (120-155)/(60-92) 120/74 (08/08 1200) SpO2:  [96 %-100 %] 99 % (08/08 1200) Weight:  [211 lb 6.7 oz (95.9 kg)] 211 lb 6.7 oz (95.9 kg) (08/08 0232)  CBC:   Recent Labs Lab 07/14/17 0405  WBC 11.8*  HGB 9.3*  HCT 29.2*  MCV 85.9  PLT 91*    Basic Metabolic Panel:   Recent Labs Lab 07/13/17 0305 07/14/17 0405  NA 135 138  K 3.4* 3.8  CL 103 104  CO2 26 28  GLUCOSE 166* 158*  BUN 28* 34*  CREATININE 1.90* 1.91*  CALCIUM 7.4* 7.8*    Lipid Panel:     Component Value Date/Time   CHOL 101 07/06/2017 0428   TRIG 69 07/06/2017 0428   HDL 25 (L) 07/06/2017 0428   CHOLHDL 4.0 07/06/2017 0428   VLDL 14 07/06/2017 0428   LDLCALC 62 07/06/2017 0428   HgbA1c:  Lab Results  Component Value Date   HGBA1C 8.0 (H) 07/06/2017   Urine Drug Screen:     Component Value Date/Time   LABOPIA NONE DETECTED 07/06/2017 1811   COCAINSCRNUR NONE DETECTED 07/06/2017 1811   LABBENZ NONE DETECTED 07/06/2017 1811   AMPHETMU NONE DETECTED 07/06/2017 1811   THCU NONE DETECTED 07/06/2017 1811   LABBARB NONE DETECTED 07/06/2017 1811    Alcohol Level     Component Value Date/Time   ETH <5 03/16/2016 1358    IMAGING   Dg Chest 1 View 07/30/2017 The findings suggest low-grade CHF.  There is no alveolar pneumonia.    Ct Head Wo  Contrast 07-30-17  Recent appearing infarct in the left medial occipital lobe. Suspect early acute infarct in the left parietal lobe with questionable early hyperdense vessel change in the left middle cerebral artery. Given these findings, correlation with MR pre and post-contrast advised to further evaluate. No mass or hemorrhage evident. No midline shift. Foci of calcification noted in each carotid siphon.   Ct Head Wo Contrast 07/09/2017  Acute hemorrhagic infarct in the left occipital lobe similar to prior CT and MRI New areas of acute infarct in the cerebellum bilaterally and in the right occipital lobe.  Findings consistent with continued posterior circulation emboli    Mr Brain Wo Contrast (neuro Protocol) Mr Shirlee Latch (cerebral Arteries) Jul 30, 2017 1. Left PCA territory acute infarct with associated diffuse petechial hemorrhage throughout the infarct site, without mass effect.  2. Occlusion of left posterior cerebral artery at the mid P2 segment.  3. Scattered subcentimeter foci of acute ischemia within both cerebellar hemispheres, left thalamus, left anterior temporal lobe, left parietal lobe and both frontal lobes. The distribution suggests a central cardioembolic process.   Cartoid [place] 07/06/2017 B ICA 1-39% stenosis, VAs antegrade  2D Echo 07/06/2017 - Left ventricle: The cavity size was normal. There was moderate   concentric hypertrophy. Systolic function was  moderately reduced.   The estimated ejection fraction was in the range of 35% to 40%.   Moderate diffuse hypokinesis with no identifiable regional   variations. - Ventricular septum: Septal motion showed abnormal function,   dyssynergy, and paradox. These changes are consistent with   intraventricular conduction delay. - Aortic valve: There was trivial regurgitation. - Mitral valve: There was mild regurgitation. - Left atrium: The atrium was mildly dilated. - Right atrium: The atrium was mildly dilated. -  Pericardium, extracardiac: A trivial pericardial effusion was   identified. There was a left pleural effusion.  EEG 07/12/17 : moderate diffuse slowing of electrocerebral activity.  This can be seen in a wide variety of encephalopathic states including those of a toxic, metabolic, or degenerative nature.  The patient did, however, have occasional triphasic sharp waveforms during the EEG which would suggest a metabolic etiology.  There was no epileptiform activity on this recording.    PHYSICAL EXAM  Temp:  [98.2 F (36.8 C)-100.6 F (38.1 C)] 98.4 F (36.9 C) (08/08 1200) Pulse Rate:  [32-134] 53 (08/08 1200) Resp:  [11-29] 23 (08/08 1200) BP: (120-155)/(60-92) 120/74 (08/08 1200) SpO2:  [96 %-100 %] 99 % (08/08 1200) Weight:  [211 lb 6.7 oz (95.9 kg)] 211 lb 6.7 oz (95.9 kg) (08/08 0232)  General - Well nourished, well developed, lethargic barely arousable.  Ophthalmologic - Fundi not visualized due to noncooperation.  Cardiovascular - irregularly irregular heart rate and rhythm.  Neuro - stuporous and can be barely aroused.  Eyes open partially only to verbal, tactile, or painful stimuli.  Does not speak or follow any commands. No withdrawal to pain.  No reaction to sternal rub.  No neck stiffness.     ASSESSMENT/PLAN Mr. [name] is a 83 y.o. male with history of diabetes, hypertension, hypothyroidism, and history of acute kidney injury who presented with approximately two days of vision problem for a couple of days. He did not receive IV t-PA due to unknown time LKW.   Stroke: multifocal infarcts bilateral anterior and posterior infarcts, cardioembolic pattern likely due to newly recognized afib.   Resultant  Right upper quadrantanopia, lethargy  CT head: Recent L medial occipital lobe infarct, acute L parietal infarct with questionable hyperdense L MCA.  MRI head:  L PCA infarcts with diffuse petechial hemorrhage, and scattered bilateral subcentimeter acute infarcts in  L thalamus, L anterior temporal lobe, and L parietal lobe and both frontal lobes  MRA head:  L P2 occlusion  Ct Head Wo Contrast 07/09/2017 Acute hemorrhagic infarct in the left occipital lobe similar to prior CT and MRI New areas of acute infarct in the cerebellum bilaterally and in the right occipital lobe. Findings consistent with continued posterior circulation emboli  Carotid Doppler: B ICA 1-39% stenosis, VAs antegrade  2D Echo  EF 35-40%  LDL 62   HgbA1c 8.8  SCDs for VTE prophylaxis DIET - DYS 1 Room service appropriate? Yes; Fluid consistency: Thin  No antithrombotic prior to admission, now on ASA 325mg . Due to afib, we recommend anticoagulation for stroke prevention in 7 days due to moderate sized infarct and petechial hemorrhage. Once anticoagulatoin started, ASA can be discontinued. ASA on hold 2/2 acute hemorrhagic infarct in the left occipital lobe   Ongoing aggressive stroke risk factor management  Therapy recommendations:  SNF recommended  Disposition:  Pending  afib newly recognized  EKG and tele showed afib  Cardiology on board and help appreciated  Initiate on ASA, will recommend anticoagulation in 7  days due to moderate sized infarct and petechial hemorrhage. Once anticoagulation started, ASA can be stopped from stroke standpoint.  ASA on hold 2/2 acute hemorrhagic infarct in the left occipital lobe   MRSA UTI  UA showed WBC TNTC  Urine culture positive for MRSA  On rocephin and vanco  Hx of DVT on Xarelto  Hx of DVT in 03/2016  Was on Xarelto, but reported not on Xarelto PTA  INR was 1.49 -> 1.54 -> 1.56 - etiology unclear, ?? Is pt taking Xarelto at home?? vs. Chronic elevation ??  PTT - 38 (24-36)  Hypertension  Stable  Permissive hypertension (OK if < 220/120) but gradually normalize in 5-7 days  Long-term BP goal normotensive  Hyperlipidemia  Home meds:  none  LDL 62, goal < 70  Add atorvastatin 20mg  PO daily  Continue statin  at discharge  Diabetes  HgbA1c 8.8, goal < 7.0  Uncontrolled  Hyperglycemia  On levemir  Likely not taking meds at home  Other Stroke Risk Factors  Advanced age  Other Active Problems  Elevated troponin: 0.3 ->0.39 ->0.44  Elevated Cre 1.31->1.61  Noncompliance   Neglect at home??  Anemia - 8.5 / 26.6  Hypokalemia - 3.3  Thrombocytopenia - 134 -> 100 K  Acute hemorrhagic infarct in the left occipital lobe by CT 07/09/2017 (on ASA 325 mg daily -> ASA now on hold)  Elevated INRs 1.56 today  LFTs - WNL except AST 80 (Ammonia 17 -WNL)  PLAN  Palliative care consult 07/11/2017. "Family understands patient's poor prognosis, but wishes to continue current care for 2-3 days before making final decision regarding transition to full comfort measures. They do not wish artificial feeding at this time"  ASA on hold 2/2 acute hemorrhagic infarct in the left occipital lobe   Repeat Head CT 07/09/2017 as above  Hospital day # 9 The patient's condition remains essentially unchanged with no significant improvement in the last few days. Family  t would like to continue medical support for the next few days and on likely not going to prefer PEG tube and nursing home placement. Continue tube feeds and   medical support for the next few days for improvement if not consider comfort care. discussed with RN. No new suggestions   Delia Heady, MD Medical Director [facility] Pager: [phone] 07/14/2017 12:45 PM       To contact Stroke Continuity provider, please refer to [email or web address]. After hours, contact General Neurology

## 2017-07-14 NOTE — Progress Notes (Signed)
PROGRESS NOTE [facility]   [name]   ZOX:[id number] DOB: 01/14/1934  DOA: 06/27/2017 PCP: [name], [name], MD   Brief Narrative:  Patient is a 83 year old male with hypothyroidism, hypertension, BPH, diabetes presented due to confusion, acute encephalopathy. Family had called EMS because patient did not answer the door. Per EMS patient was sitting in the chair, soiled in urine and stool. Patient had Hospital band on patient's arm dated December 2017 and med bottles were dated the same date and were empty. Patient reported that he had not been changed in 2 weeks. CT head on admission showed recent appearing infarct in the left medial occipital lobe, early acute infarct in the left parietal lobe. Patient was admitted for further workup.  Subjective: Patient seen, he is on his room nonresponsive to verbal or tactile stimuli.  Assessment & Plan: Acute CVA (cerebral vascular accident) (HCC) With acute metabolic encephalopathy - CT head showed recent appearing infarct in the left medial occipital lobe, early acute infarct in the left parietal lobe. - MRI showed a left PCA territory acute infarct with associated diffuse petechial hemorrhage throughout the infarct site. Occlusion of the left posterior cerebral artery at the mid P2 segment. Scattered subcentimeter foci of acute ischemia within both cerebral hemispheres, left thalamus and left anterior temporal lobe, parietal lobe and both frontal lobes suggesting central cardioembolic process - 2-D echo showed EF 35-40%, moderate diffuse hypokinesis, intraventricular conduction delay  - Carotid Doppler showed no significant ICA stenosis - Neurology was consulted, recommended aspirin - LDL 62, on Lipitor 20 mg daily  - Cardiology was consulted, patient seen by Dr Elease HashimotoNahser, per rec's ECG showed atrial fibrillation, occasional normal sinus rhythm, had stroke and will eventually need to be started on oral anticoagulation.  - Per neurology,  continue aspirin 325 mg for now. Due to Atrial fibrillation, recommend anticoagulation for stroke prevention in 7 days due to moderate-sized infarct and petechial hemorrhage. [facility] can be discontinued. D/w Dr Roda ShuttersXu, neurology recommended eliquis.- on 8/3, patient noted to be having weakness more in the left upper arm, CT head was repeated which showed acute hemorrhagic infarct in the left occipital lobe, new areas of acute infarction in the cerebellum bilaterally and in the right occipital lobe.  -patient continues with very poor prognosis, given now is not responsive, nutrition is an issue, palliative care has been consulted to discuss goals of care, I do not suspect further recovery in this patient. Family discussed goals of care and will come up with a decision tomorrow. Patient will benefit from hospice care.   Acute metabolic encephalopathy - 2/2 to multiple CVA's - no significant improvement. Clinically remains the same.  - CT on 8/3 showed acute hemorrhagic infarct in the left occipital lobe, new areas of acute infarction cerebellum bilaterally and in right occipital lobe, neurology was reconsulted - EEG showed moderate diffuse slowing of electro cerebral activity, no spikes or paroxysmal activity, no epileptiform activity on this recording -TSH was 6.2, free T4 0.9 , neurology placed patient on Synthroid 100 g daily, rule out myxedema coma   - Cortrack tube feeding for now.  Mildly elevated troponins - Cardiology was consulted deemed 2/2 demand ischemia and no further workup is recommended at this time  - 2-D echo showed EF of 35-40% with moderate diffuse hypokinesis  Acute Systolic CHF - 2-D echo showed EF of 35-40%, cardiology consulted - Per cardiology poor candidate for invasive cardiac procedures, will treat with medical management for CHF  Failure to  thrive (adult) - Patient reportedly found in feces and urine with Hospital bracelet from December 2017  MRSA   UTI - Urine culture showing more than 100,000 colonies of staph aureus - Treated with Rocephin for 5 days.  - Received vancomycin for 9 days, will d/c today   Essential hypertension - Allow for permissive hypertension given the acute stroke - BP has normalized without medical intervention   Diabetes mellitus type 2  -Hemoglobin A1c 8.0  -Placed on SSI q4hrs    Hypothyroidism - TSH 6.2, continue Synthroid. Unclear if patient was taking his medications, does not appear to have filled after December 2017 - Synthroid restarted, repeat thyroid testing in 4-6 weeks  Moderate malnutrition - Nutrition consulted, continue supplementation  Acute kidney injury - no improvement despite IVF  -Likely due to poor oral intake -Palliative goals of care pending   Thrush - continue Diflucan  Chronic normocytic anemia - Hemoglobin appears to be at baseline  DVT prophylaxis: SCD's  Code Status: DNR Family Communication: No family members present  Disposition Plan: Poor prognosis, hospice vs palliative care   Consultants:   Neurology   Palliative care   Procedures:   MRI/MRA brain   2D ECHO   Antimicrobials: Anti-infectives    Start     Dose/Rate Route Frequency Ordered Stop   07/12/17 0600  vancomycin (VANCOCIN) IVPB 750 mg/150 ml premix  Status:  Discontinued     750 mg 150 mL/hr over 60 Minutes Intravenous Every 24 hours 07/11/17 1244 07/14/17 1732   07/10/17 0600  vancomycin (VANCOCIN) IVPB 1000 mg/200 mL premix  Status:  Discontinued     1,000 mg 200 mL/hr over 60 Minutes Intravenous Every 24 hours 07/09/17 2332 07/11/17 1244   07/06/17 2200  vancomycin (VANCOCIN) 1,250 mg in sodium chloride 0.9 % 250 mL IVPB  Status:  Discontinued     1,250 mg 166.7 mL/hr over 90 Minutes Intravenous Every 24 hours 07/06/17 1020 07/09/17 2329   07/06/17 1000  vancomycin (VANCOCIN) IVPB 1000 mg/200 mL premix  Status:  Discontinued     1,000 mg 200 mL/hr over 60 Minutes Intravenous  Every 12 hours 2017/07/31 2138 07/06/17 1017   07/06/17 1000  fluconazole (DIFLUCAN) tablet 100 mg     100 mg Oral Daily July 31, 2017 2319 07/09/17 0959   07-31-2017 2200  vancomycin (VANCOCIN) 1,500 mg in sodium chloride 0.9 % 500 mL IVPB  Status:  Discontinued     1,500 mg 250 mL/hr over 120 Minutes Intravenous  Once 07-31-17 2135 07/06/17 1017   Jul 31, 2017 2145  cefTRIAXone (ROCEPHIN) 1 g in dextrose 5 % 50 mL IVPB  Status:  Discontinued     1 g 100 mL/hr over 30 Minutes Intravenous Every 24 hours 2017/07/31 2144 07/09/17 1224        Objective: Vitals:   07/14/17 0340 07/14/17 0400 07/14/17 0700 07/14/17 0745  BP: 137/89 (!) 142/61 130/60 130/60  Pulse: (!) 56 (!) 52 (!) 52   Resp: (!) 24 13 (!) 26 (!) 24  Temp: (!) 100.6 F (38.1 C)   99.5 F (37.5 C)  TempSrc: Oral   Oral  SpO2: 100% 100% 99% 100%  Weight:      Height:        Intake/Output Summary (Last 24 hours) at 07/14/17 0939 Last data filed at 07/14/17 0700  Gross per 24 hour  Intake           1202.5 ml  Output  800 ml  Net            402.5 ml   Filed Weights   07/13/17 0059 07/13/17 0313 07/14/17 0232  Weight: 94.6 kg (208 lb 8.9 oz) 91.6 kg (201 lb 15.1 oz) 95.9 kg (211 lb 6.7 oz)    Examination:  General exam: Appears calm and comfortable  HEENT: AC/AT, PERRLA, OP moist and clear Respiratory system: Clear to auscultation. No wheezes,crackle or rhonchi Cardiovascular system: S1 & S2 heard, RRR. No JVD, murmurs, rubs or gallops Gastrointestinal system: Abdomen is nondistended, soft and nontender. No organomegaly or masses felt. Normal bowel sounds heard. Central nervous system: Alert and oriented. No focal neurological deficits. Extremities: No pedal edema. Symmetric, strength 5/5   Skin: No rashes, lesions or ulcers Psychiatry: Judgement and insight appear normal. Mood & affect appropriate.    Data Reviewed: I have personally reviewed following labs and imaging studies  CBC:  Recent Labs Lab  07/14/17 0405  WBC 11.8*  HGB 9.3*  HCT 29.2*  MCV 85.9  PLT 91*   Basic Metabolic Panel:  Recent Labs Lab 07/09/17 0335 07/10/17 0329 07/12/17 0236 07/13/17 0305 07/14/17 0405  NA 139 137 137 135 138  K 3.6 3.3* 3.5 3.4* 3.8  CL 105 105 105 103 104  CO2 26 26 26 26 28   GLUCOSE 174* 197* 160* 166* 158*  BUN 17 22* 25* 28* 34*  CREATININE 1.43* 1.61* 1.86* 1.90* 1.91*  CALCIUM 8.0* 7.7* 7.5* 7.4* 7.8*   GFR: Estimated Creatinine Clearance: 37.2 mL/min (A) (by C-G formula based on SCr of 1.91 mg/dL (H)). Liver Function Tests:  Recent Labs Lab 07/10/17 1619  AST 80*  ALT 25  ALKPHOS 68  BILITOT 0.7  PROT 7.8  ALBUMIN 1.6*   No results for input(s): LIPASE, AMYLASE in the last 168 hours.  Recent Labs Lab 07/10/17 1619  AMMONIA 17   Coagulation Profile:  Recent Labs Lab 07/10/17 0329 07/10/17 1619 07/12/17 0236 07/13/17 0305 07/14/17 0405  INR 1.70 1.56 1.69 1.79 1.62   Cardiac Enzymes: No results for input(s): CKTOTAL, CKMB, CKMBINDEX, TROPONINI in the last 168 hours. BNP (last 3 results) No results for input(s): PROBNP in the last 8760 hours. HbA1C: No results for input(s): HGBA1C in the last 72 hours. CBG:  Recent Labs Lab 07/13/17 1647 07/13/17 2042 07/13/17 2337 07/14/17 0335 07/14/17 0744  GLUCAP 131* 154* 133* 109* 172*   Lipid Profile: No results for input(s): CHOL, HDL, LDLCALC, TRIG, CHOLHDL, LDLDIRECT in the last 72 hours. Thyroid Function Tests: No results for input(s): TSH, T4TOTAL, FREET4, T3FREE, THYROIDAB in the last 72 hours. Anemia Panel: No results for input(s): VITAMINB12, FOLATE, FERRITIN, TIBC, IRON, RETICCTPCT in the last 72 hours. Sepsis Labs: No results for input(s): PROCALCITON, LATICACIDVEN in the last 168 hours.  Recent Results (from the past 240 hour(s))  Urine culture     Status: Abnormal   Collection Time: 06/29/2017  2:39 PM  Result Value Ref Range Status   Specimen Description URINE, RANDOM  Final    Special Requests NONE  Final   Culture (A)  Final    >=100,000 COLONIES/mL METHICILLIN RESISTANT STAPHYLOCOCCUS AUREUS   Report Status 07/08/2017 FINAL  Final   Organism ID, Bacteria METHICILLIN RESISTANT STAPHYLOCOCCUS AUREUS (A)  Final      Susceptibility   Methicillin resistant staphylococcus aureus - MIC*    CIPROFLOXACIN >=8 RESISTANT Resistant     GENTAMICIN <=0.5 SENSITIVE Sensitive     NITROFURANTOIN <=16 SENSITIVE Sensitive  OXACILLIN >=4 RESISTANT Resistant     TETRACYCLINE <=1 SENSITIVE Sensitive     VANCOMYCIN 1 SENSITIVE Sensitive     TRIMETH/SULFA <=10 SENSITIVE Sensitive     CLINDAMYCIN <=0.25 SENSITIVE Sensitive     RIFAMPIN <=0.5 SENSITIVE Sensitive     Inducible Clindamycin NEGATIVE Sensitive     * >=100,000 COLONIES/mL METHICILLIN RESISTANT STAPHYLOCOCCUS AUREUS  MRSA PCR Screening     Status: Abnormal   Collection Time: 07/06/17  1:00 AM  Result Value Ref Range Status   MRSA by PCR POSITIVE (A) NEGATIVE Final    Comment:        The GeneXpert MRSA Assay (FDA approved for NASAL specimens only), is one component of a comprehensive MRSA colonization surveillance program. It is not intended to diagnose MRSA infection nor to guide or monitor treatment for MRSA infections. RESULT CALLED TO, READ BACK BY AND VERIFIED WITH: M. WOOD 0651 07.31.2018 N. MORRIS          Radiology Studies: No results found.    Scheduled Meds: . citalopram  20 mg Oral Daily  . insulin aspart  0-9 Units Subcutaneous Q4H  . latanoprost  1 drop Both Eyes QHS  . levothyroxine  100 mcg Intravenous Daily  . mouth rinse  15 mL Mouth Rinse BID  . senna  1 tablet Oral Daily  . silver sulfADIAZINE   Topical Daily  . tamsulosin  0.4 mg Oral QPC supper   Continuous Infusions: . feeding supplement (GLUCERNA 1.2 CAL) 1,000 mL (07/14/17 0030)  . vancomycin Stopped (07/14/17 0650)     LOS: 9 days    Time spent: Total of 15 minutes spent with pt, greater than 50% of which was  spent in discussion of  treatment, counseling and coordination of care    Latrelle Dodrill, MD Pager: Text Page via [email or web address]  [phone]  If 7PM-7AM, please contact night-coverage [email or web address] Password [facility] 07/14/2017, 9:39 AM

## 2017-07-14 NOTE — Clinical Social Work Note (Signed)
In the case of SNF placement CSW reached out to pt's son to determine what facilities the family preferred the pt go to. Pt's son states he will talk to his wife and call the CSW back tomorrow (8/9).  ClevelandBridget Hattye Siegfried, [place] [phone]

## 2017-07-14 NOTE — Plan of Care (Signed)
Problem: Health Behavior/Discharge Planning: Goal: Ability to manage health-related needs will improve Outcome: Progressing Family due to have palliative meeting today 07/14/17 to discuss plan for this patient.   Problem: Physical Regulation: Goal: Ability to maintain clinical measurements within normal limits will improve Outcome: Not Progressing Patient spiked fever of 100.6 and was given tylenol. Recheck was 99.5. Patient has orders to give tylenol for >99.5. Day shift Rn informed.   Problem: Skin Integrity: Goal: Risk for impaired skin integrity will decrease Outcome: Progressing Patient turned every 2 hours.

## 2017-07-15 LAB — BASIC METABOLIC PANEL
Anion gap: 8 (ref 5–15)
BUN: 43 mg/dL — AB (ref 6–20)
CALCIUM: 7.5 mg/dL — AB (ref 8.9–10.3)
CO2: 29 mmol/L (ref 22–32)
CREATININE: 2.04 mg/dL — AB (ref 0.61–1.24)
Chloride: 105 mmol/L (ref 101–111)
GFR calc non Af Amer: 29 mL/min — ABNORMAL LOW (ref >60)
GFR, EST AFRICAN AMERICAN: 33 mL/min — AB (ref >60)
GLUCOSE: 161 mg/dL — AB (ref 65–99)
Potassium: 4 mmol/L (ref 3.5–5.1)
Sodium: 142 mmol/L (ref 135–145)

## 2017-07-15 LAB — CBC
HCT: 27.6 % — ABNORMAL LOW (ref 39.0–52.0)
Hemoglobin: 9 g/dL — ABNORMAL LOW (ref 13.0–17.0)
MCH: 28.3 pg (ref 26.0–34.0)
MCHC: 32.6 g/dL (ref 30.0–36.0)
MCV: 86.8 fL (ref 78.0–100.0)
PLATELETS: 92 10*3/uL — AB (ref 150–400)
RBC: 3.18 MIL/uL — ABNORMAL LOW (ref 4.22–5.81)
RDW: 14.8 % (ref 11.5–15.5)
WBC: 10.1 10*3/uL (ref 4.0–10.5)

## 2017-07-15 LAB — GLUCOSE, CAPILLARY
GLUCOSE-CAPILLARY: 140 mg/dL — AB (ref 65–99)
GLUCOSE-CAPILLARY: 152 mg/dL — AB (ref 65–99)
Glucose-Capillary: 152 mg/dL — ABNORMAL HIGH (ref 65–99)
Glucose-Capillary: 149 mg/dL — ABNORMAL HIGH (ref 65–99)
Glucose-Capillary: 159 mg/dL — ABNORMAL HIGH (ref 65–99)
Glucose-Capillary: 160 mg/dL — ABNORMAL HIGH (ref 65–99)

## 2017-07-15 NOTE — Progress Notes (Signed)
Occupational Therapy Discharge Patient Details Name: [name] MRN: [id number] DOB: 03/12/1934 Today's Date: 07/15/2017 Time:  -     Patient discharged from OT services secondary to medical decline - will need to re-order OT to resume therapy services.  Please see latest therapy progress note for current level of functioning and progress toward goals.    Progress and discharge plan discussed with patient and/or caregiver: Patient unable to participate in discharge planning and no caregivers available  GO     Gaye AlkenBailey A Soleil Mas M.S., OTR/L Pager: [phone]  07/15/2017, 1:13 PM

## 2017-07-15 NOTE — Progress Notes (Signed)
  Speech Language Pathology Treatment: Dysphagia  Patient Details Name: [name] MRN: [id number] DOB: 09/10/1934 Today's Date: 07/15/2017 Time: 1500-1510 SLP Time Calculation (min) (ACUTE ONLY): 10 min  Assessment / Plan / Recommendation Clinical Impression  Pt still poorly responsive despite max effort. Tactile stimulation to lips and mouth with ice and utensils elicits minimal movement and no significant manipulation of PO. Pts eyes remained closed and movement was minimal. No progress this week. Will sign off at this time. Please reorder if function changes.   HPI HPI: Charlynne PanderJames C Lauderbaugh is a 83 y.o. male with a history of diabetes, hypertension who states that he has had some trouble with his vision for a couple of days. Apparently was found by his family to be confused. He was found disheveled, and soiled. He was brought into the emergency department where a head CT was performed which showed a hypodensity in the left occipital lobe. An MRI was therefore performed which shows a large left PCA territory infarct as well as multifocal other likely embolic infarcts. SLP observed significant pocketing 8/1 perhaps due to hypoglycemia. Today copious amount eggs in oral cavity and BSE order obtained.       SLP Plan  Discharge SLP treatment due to (comment) (lack of progress)       Recommendations  Diet recommendations: Other(comment) (if alert allow PO for comfort) Medication Administration: Via alternative means                Oral Care Recommendations: Oral care QID Follow up Recommendations: Skilled Nursing facility SLP Visit Diagnosis: Dysphagia, oral phase (R13.11) Plan: Discharge SLP treatment due to (comment) (lack of progress)       GO               Harlon DittyBonnie Mervyn Pflaum, MA CCC-SLP [phone]  Claudine MoutonDeBlois, Deshawn Witty Caroline 07/15/2017, 3:22 PM

## 2017-07-15 NOTE — Progress Notes (Signed)
Palliative Medicine RN NoteSherron Monday: Spoke w daughter Maxine GlennMonica [phone]. She states [place]'s [place] later today, and they will have a family discussion at that time. Family will call later today/this evening with decision re: care goals. If no return call by tomorrow am, I will reach out again.  Margret ChanceMelanie G. Kiira Brach, RN, BSN, [facility] 07/15/2017 9:48 AM Cell [phone] 8:00-4:00 Monday-Friday Office [phone]

## 2017-07-15 NOTE — Plan of Care (Signed)
Problem: Education: Goal: Knowledge of  General Education information/materials will improve Outcome: Not Progressing Pt. unresponsive; meds and care explained when done.

## 2017-07-15 NOTE — Progress Notes (Signed)
STROKE TEAM PROGRESS NOTE    SUBJECTIVE (INTERVAL HISTORY) He is alone in the room.  The patient is awake, with left-sided neglect, and a rightward gaze.  He is unresponsive to verbal commands and weakly withdrawing from painful stimuli.   Family are yet undecided about PEG tube and nursing home placementwith nurse. Family wants to support him for a few days OBJECTIVE Temp:  [98.1 F (36.7 C)-100 F (37.8 C)] 98.9 F (37.2 C) (08/09 1143) Pulse Rate:  [28-49] 28 (08/09 0721) Cardiac Rhythm: Normal sinus rhythm (08/09 0750) Resp:  [20-24] 21 (08/09 0721) BP: (113-128)/(56-71) 123/71 (08/09 1143) SpO2:  [98 %-100 %] 100 % (08/09 0721) Weight:  [214 lb (97.1 kg)] 214 lb (97.1 kg) (08/08 2330)  CBC:   Recent Labs Lab 07/14/17 0405 07/15/17 0533  WBC 11.8* 10.1  HGB 9.3* 9.0*  HCT 29.2* 27.6*  MCV 85.9 86.8  PLT 91* 92*    Basic Metabolic Panel:   Recent Labs Lab 07/14/17 0405 07/15/17 0533  NA 138 142  K 3.8 4.0  CL 104 105  CO2 28 29  GLUCOSE 158* 161*  BUN 34* 43*  CREATININE 1.91* 2.04*  CALCIUM 7.8* 7.5*    Lipid Panel:     Component Value Date/Time   CHOL 101 07/06/2017 0428   TRIG 69 07/06/2017 0428   HDL 25 (L) 07/06/2017 0428   CHOLHDL 4.0 07/06/2017 0428   VLDL 14 07/06/2017 0428   LDLCALC 62 07/06/2017 0428   HgbA1c:  Lab Results  Component Value Date   HGBA1C 8.0 (H) 07/06/2017   Urine Drug Screen:     Component Value Date/Time   LABOPIA NONE DETECTED 07/06/2017 1811   COCAINSCRNUR NONE DETECTED 07/06/2017 1811   LABBENZ NONE DETECTED 07/06/2017 1811   AMPHETMU NONE DETECTED 07/06/2017 1811   THCU NONE DETECTED 07/06/2017 1811   LABBARB NONE DETECTED 07/06/2017 1811    Alcohol Level     Component Value Date/Time   ETH <5 03/16/2016 1358    IMAGING   Dg Chest 1 View 2017-07-11 The findings suggest low-grade CHF.  There is no alveolar pneumonia.    Ct Head Wo Contrast Jul 11, 2017  Recent appearing infarct in the left medial  occipital lobe. Suspect early acute infarct in the left parietal lobe with questionable early hyperdense vessel change in the left middle cerebral artery. Given these findings, correlation with MR pre and post-contrast advised to further evaluate. No mass or hemorrhage evident. No midline shift. Foci of calcification noted in each carotid siphon.   Ct Head Wo Contrast 07/09/2017  Acute hemorrhagic infarct in the left occipital lobe similar to prior CT and MRI New areas of acute infarct in the cerebellum bilaterally and in the right occipital lobe.  Findings consistent with continued posterior circulation emboli    Mr Brain Wo Contrast (neuro Protocol) Mr Shirlee Latch (cerebral Arteries) 11-Jul-2017 1. Left PCA territory acute infarct with associated diffuse petechial hemorrhage throughout the infarct site, without mass effect.  2. Occlusion of left posterior cerebral artery at the mid P2 segment.  3. Scattered subcentimeter foci of acute ischemia within both cerebellar hemispheres, left thalamus, left anterior temporal lobe, left parietal lobe and both frontal lobes. The distribution suggests a central cardioembolic process.   Cartoid [place] 07/06/2017 B ICA 1-39% stenosis, VAs antegrade  2D Echo 07/06/2017 - Left ventricle: The cavity size was normal. There was moderate   concentric hypertrophy. Systolic function was moderately reduced.   The estimated ejection fraction was in the range  of 35% to 40%.   Moderate diffuse hypokinesis with no identifiable regional   variations. - Ventricular septum: Septal motion showed abnormal function,   dyssynergy, and paradox. These changes are consistent with   intraventricular conduction delay. - Aortic valve: There was trivial regurgitation. - Mitral valve: There was mild regurgitation. - Left atrium: The atrium was mildly dilated. - Right atrium: The atrium was mildly dilated. - Pericardium, extracardiac: A trivial pericardial effusion was   identified.  There was a left pleural effusion.  EEG 07/12/17 : moderate diffuse slowing of electrocerebral activity.  This can be seen in a wide variety of encephalopathic states including those of a toxic, metabolic, or degenerative nature.  The patient did, however, have occasional triphasic sharp waveforms during the EEG which would suggest a metabolic etiology.  There was no epileptiform activity on this recording.    PHYSICAL EXAM  Temp:  [98.1 F (36.7 C)-100 F (37.8 C)] 98.9 F (37.2 C) (08/09 1143) Pulse Rate:  [28-49] 28 (08/09 0721) Resp:  [20-24] 21 (08/09 0721) BP: (113-128)/(56-71) 123/71 (08/09 1143) SpO2:  [98 %-100 %] 100 % (08/09 0721) Weight:  [214 lb (97.1 kg)] 214 lb (97.1 kg) (08/08 2330)  General - Well nourished, well developed, lethargic barely arousable.  Ophthalmologic - Fundi not visualized due to noncooperation.  Cardiovascular - irregularly irregular heart rate and rhythm.  Neuro - stuporous and can be barely aroused.  Eyes open partially only to verbal, tactile, or painful stimuli.  Does not speak or follow any commands. No withdrawal to pain.  No reaction to sternal rub.  No neck stiffness.     ASSESSMENT/PLAN Mr. [name] is a 83 y.o. male with history of diabetes, hypertension, hypothyroidism, and history of acute kidney injury who presented with approximately two days of vision problem for a couple of days. He did not receive IV t-PA due to unknown time LKW.   Stroke: multifocal infarcts bilateral anterior and posterior infarcts, cardioembolic pattern likely due to newly recognized afib.   Resultant  Right upper quadrantanopia, lethargy  CT head: Recent L medial occipital lobe infarct, acute L parietal infarct with questionable hyperdense L MCA.  MRI head:  L PCA infarcts with diffuse petechial hemorrhage, and scattered bilateral subcentimeter acute infarcts in L thalamus, L anterior temporal lobe, and L parietal lobe and both frontal lobes  MRA  head:  L P2 occlusion  Ct Head Wo Contrast 07/09/2017 Acute hemorrhagic infarct in the left occipital lobe similar to prior CT and MRI New areas of acute infarct in the cerebellum bilaterally and in the right occipital lobe. Findings consistent with continued posterior circulation emboli  Carotid Doppler: B ICA 1-39% stenosis, VAs antegrade  2D Echo  EF 35-40%  LDL 62   HgbA1c 8.8  SCDs for VTE prophylaxis DIET - DYS 1 Room service appropriate? Yes; Fluid consistency: Thin  No antithrombotic prior to admission, now on ASA 325mg . Due to afib, we recommend anticoagulation for stroke prevention in 7 days due to moderate sized infarct and petechial hemorrhage. Once anticoagulatoin started, ASA can be discontinued. ASA on hold 2/2 acute hemorrhagic infarct in the left occipital lobe   Ongoing aggressive stroke risk factor management  Therapy recommendations:  SNF recommended  Disposition:  Pending  afib newly recognized  EKG and tele showed afib  Cardiology on board and help appreciated  Initiate on ASA, will recommend anticoagulation in 7 days due to moderate sized infarct and petechial hemorrhage. Once anticoagulation started, ASA can be stopped  from stroke standpoint.  ASA on hold 2/2 acute hemorrhagic infarct in the left occipital lobe   MRSA UTI  UA showed WBC TNTC  Urine culture positive for MRSA  On rocephin and vanco  Hx of DVT on Xarelto  Hx of DVT in 03/2016  Was on Xarelto, but reported not on Xarelto PTA  INR was 1.49 -> 1.54 -> 1.56 - etiology unclear, ?? Is pt taking Xarelto at home?? vs. Chronic elevation ??  PTT - 38 (24-36)  Hypertension  Stable  Permissive hypertension (OK if < 220/120) but gradually normalize in 5-7 days  Long-term BP goal normotensive  Hyperlipidemia  Home meds:  none  LDL 62, goal < 70  Add atorvastatin 20mg  PO daily  Continue statin at discharge  Diabetes  HgbA1c 8.8, goal < 7.0  Uncontrolled  Hyperglycemia  On  levemir  Likely not taking meds at home  Other Stroke Risk Factors  Advanced age  Other Active Problems  Elevated troponin: 0.3 ->0.39 ->0.44  Elevated Cre 1.31->1.61  Noncompliance   Neglect at home??  Anemia - 8.5 / 26.6  Hypokalemia - 3.3  Thrombocytopenia - 134 -> 100 K  Acute hemorrhagic infarct in the left occipital lobe by CT 07/09/2017 (on ASA 325 mg daily -> ASA now on hold)  Elevated INRs 1.56 today  LFTs - WNL except AST 80 (Ammonia 17 -WNL)  PLAN  Palliative care consult 07/11/2017. "Family understands patient's poor prognosis, but wishes to continue current care for 2-3 days before making final decision regarding transition to full comfort measures. They do not wish artificial feeding at this time"  ASA on hold 2/2 acute hemorrhagic infarct in the left occipital lobe   Repeat Head CT 07/09/2017 as above  Hospital day # 10 The patient's condition remains essentially unchanged with no significant improvement in the last few days. Family  t would like to continue medical support for the next few days and on likely not going to prefer PEG tube and nursing home placement. Continue tube feeds and   medical support for the next few days for improvement if not consider comfort care. discussed with RN. No new suggestions  Stroke team will sign off. D/w primary attending MD Delia Heady, MD Medical Director [facility] Pager: [phone] 07/15/2017 12:26 PM       To contact Stroke Continuity provider, please refer to [email or web address]. After hours, contact General Neurology

## 2017-07-15 NOTE — Progress Notes (Signed)
Nutrition Folllow Up  DOCUMENTATION CODES:   Non-severe (moderate) malnutrition in context of chronic illness  INTERVENTION:    Continue Glucerna 1.2 formula at goal rate of 65 ml/hr  TF regimen providing 1872 kcals, 93 gm protein, 1256 ml of free water  NUTRITION DIAGNOSIS:   Malnutrition (moderate) related to chronic illness (CKD, failure to thrive) as evidenced by moderate depletion of body fat, moderate depletions of muscle mass, ongoing  GOAL:   Patient will meet greater than or equal to 90% of their needs, met  MONITOR:   TF tolerance, PO intake, Labs, Weight trends, Skin, I & O's  ASSESSMENT:   83 y.o. Male with a history of diabetes, hypertension who states that he has had some trouble with his vision for a couple of days. Apparently was found by his family to be confused. He was found disheveled, and soiled. He was brought into the emergency department where a head CT was performed which showed a hypodensity in the left occipital lobe. An MRI was therefore performed which shows a large left PCA territory infarct as well as multifocal other likely embolic infarcts.   Pt remains unresponsive. Glucerna 1.2 formula infusing at goal rate of 65 ml/hr via Cortrak feeding tube. Tolerating well. PCT notes reviewed. Family decision pending for goalsof care. Labs and medications reviewed. CBG's 140-152-152.  Diet Order:  DIET - DYS 1 Room service appropriate? Yes; Fluid consistency: Thin  Skin:  Wound (see comment) (brawny skin changes with poor hygiene)  Last BM:  8/9  Height:   Ht Readings from Last 1 Encounters:  06/30/2017 _0  (1.93 m)   Weight:   Wt Readings from Last 1 Encounters:  07/14/17 214 lb (97.1 kg)   Ideal Body Weight:  86 kg  BMI:  Body mass index is 26.05 kg/m.  Estimated Nutritional Needs:   Kcal:  1800-2000  Protein:  90-100 gm  Fluid:  1.8-2.0 L  EDUCATION NEEDS:   No education needs identified at this time  Arthur Holms, RD,  LDN Pager #: [phone] After-Hours Pager #: [phone]

## 2017-07-15 NOTE — Progress Notes (Signed)
Physical Therapy Discharge Patient Details Name: Garrett Bradley MRN: [id number] DOB: 12/23/1933 Today's Date: 07/15/2017 Time:  -     Patient discharged from PT services secondary to medical decline - will need to re-order PT to resume therapy services.  Please see latest therapy progress note for current level of functioning and progress toward goals.    Progress and discharge plan discussed with patient and/or caregiver: Patient unable to participate in discharge planning and no caregivers available  GP     Angelina OkCary W [facility]cok 07/15/2017, 11:56 AM  Skip Mayerary Chistina Roston PT [phone]

## 2017-07-15 NOTE — Progress Notes (Addendum)
PROGRESS NOTE [facility]   SILVIA MARKUSON   GNF:[id number] DOB: 01/05/1934  DOA: 06/14/2017 PCP: Assunta Found, MD   Brief Narrative:  Patient is a 83 year old male with hypothyroidism, hypertension, BPH, diabetes presented due to confusion, acute encephalopathy. Family had called EMS because patient did not answer the door. Per EMS patient was sitting in the chair, soiled in urine and stool. Patient had Hospital band on patient's arm dated December 2017 and med bottles were dated the same date and were empty. Patient reported that he had not been changed in 2 weeks. CT head on admission showed recent appearing infarct in the left medial occipital lobe, early acute infarct in the left parietal lobe. Patient was admitted for further workup.  Subjective: Patient seen and examined, continues to be unresponsive, no improvement.  Assessment & Plan: Acute CVA (cerebral vascular accident) (HCC) With acute metabolic encephalopathy - clinically remains unchanged.  - CT head showed recent appearing infarct in the left medial occipital lobe, early acute infarct in the left parietal lobe. - MRI showed a left PCA territory acute infarct with associated diffuse petechial hemorrhage throughout the infarct site. Occlusion of the left posterior cerebral artery at the mid P2 segment. Scattered subcentimeter foci of acute ischemia within both cerebral hemispheres, left thalamus and left anterior temporal lobe, parietal lobe and both frontal lobes suggesting central cardioembolic process - 2-D echo showed EF 35-40%, moderate diffuse hypokinesis, intraventricular conduction delay  - Carotid Doppler showed no significant ICA stenosis - Neurology was consulted, recommended aspirin - LDL 62, on Lipitor 20 mg daily  - Cardiology was consulted, patient seen by Dr Elease Hashimoto, ECG showed atrial fibrillation, occasional normal sinus rhythm, - EEG showed moderate diffuse slowing of electro cerebral activity, no  spikes or paroxysmal activity, no epileptiform activity on this recording - Per neurology, continue aspirin 325 mg for now. Due to Atrial fibrillation, recommend anticoagulation for stroke prevention in 7 days due to moderate-sized infarct and petechial hemorrhage. Once [facility] started aspirin can be discontinued. - on 8/3, patient noted to be having weakness more in the left upper arm, CT head was repeated which showed acute hemorrhagic infarct in the left occipital lobe, new areas of acute infarction in the cerebellum bilaterally and in the right occipital lobe.  -TSH was 6.2, free T4 0.9 , neurology placed patient on Synthroid 100 g daily, rule out myxedema coma -patient continues with very poor prognosis, given now is not responsive, nutrition is an issue, palliative care has been consulted to discuss goals of care, I do not suspect further recovery in this patient. I waiting for family to make a decision on call of care. Patient's family is out of town and they're planning to have a meeting this afternoon to come into the conclusion. I continue to recommend hospice/comfort care.   Mildly elevated troponins - Cardiology was consulted deemed 2/2 demand ischemia and no further workup is recommended at this time  - 2-D echo showed EF of 35-40% with moderate diffuse hypokinesis  Acute Systolic CHF - 2-D echo showed EF of 35-40%, cardiology consulted - Per cardiology poor candidate for invasive cardiac procedures, will treat with medical management for CHF  Failure to thrive (adult) - Patient reportedly found in feces and urine with Hospital bracelet from December 2017  MRSA  UTI - Urine culture showing more than 100,000 colonies of staph aureus -Completed Rocephin for 5 days and  vancomycin for 9 days  Essential hypertension - Permissive hypertension was allows given the acute  stroke, now BP has normalized without further medical intervention   Diabetes mellitus type 2  -Hemoglobin A1c 8.0    -Placed on SSI q4hrs    Hypothyroidism - TSH 6.2, continue Synthroid. Unclear if patient was taking his medications, does not appear to have filled after December 2017 - Synthroid restarted, repeat thyroid testing in 4-6 weeks  Moderate malnutrition - Nutrition consulted, continue supplementation  Acute kidney injury - continues to worsen despite IV fluids -Likely due to poor oral intake -Palliative goals of care pending   Thrush -Continue Diflucan  Chronic normocytic anemia - Hemoglobin appears to be at baseline  DVT prophylaxis: SCD's  Code Status: DNR Family Communication: No family members present  Disposition Plan: Poor prognosis, awaiting family decision on hospice vs further care   Consultants:   Neurology   Palliative care   Procedures:   MRI/MRA brain   2D ECHO   Antimicrobials: Anti-infectives    Start     Dose/Rate Route Frequency Ordered Stop   07/12/17 0600  vancomycin (VANCOCIN) IVPB 750 mg/150 ml premix  Status:  Discontinued     750 mg 150 mL/hr over 60 Minutes Intravenous Every 24 hours 07/11/17 1244 07/14/17 1732   07/10/17 0600  vancomycin (VANCOCIN) IVPB 1000 mg/200 mL premix  Status:  Discontinued     1,000 mg 200 mL/hr over 60 Minutes Intravenous Every 24 hours 07/09/17 2332 07/11/17 1244   07/06/17 2200  vancomycin (VANCOCIN) 1,250 mg in sodium chloride 0.9 % 250 mL IVPB  Status:  Discontinued     1,250 mg 166.7 mL/hr over 90 Minutes Intravenous Every 24 hours 07/06/17 1020 07/09/17 2329   07/06/17 1000  vancomycin (VANCOCIN) IVPB 1000 mg/200 mL premix  Status:  Discontinued     1,000 mg 200 mL/hr over 60 Minutes Intravenous Every 12 hours 06/18/2017 2138 07/06/17 1017   07/06/17 1000  fluconazole (DIFLUCAN) tablet 100 mg     100 mg Oral Daily 06/27/2017 2319 07/09/17 0959   06/19/2017 2200  vancomycin (VANCOCIN) 1,500 mg in sodium chloride 0.9 % 500 mL IVPB  Status:  Discontinued     1,500 mg 250 mL/hr over 120 Minutes Intravenous  Once  06/16/2017 2135 07/06/17 1017   06/09/2017 2145  cefTRIAXone (ROCEPHIN) 1 g in dextrose 5 % 50 mL IVPB  Status:  Discontinued     1 g 100 mL/hr over 30 Minutes Intravenous Every 24 hours 06/18/2017 2144 07/09/17 1224       Objective: Vitals:   07/14/17 2330 07/15/17 0433 07/15/17 0721 07/15/17 1143  BP: 118/62 (!) 120/57 (!) 113/56 123/71  Pulse: (!) 49 (!) 45 (!) 28   Resp: 20 (!) 21 (!) 21   Temp: 100 F (37.8 C) 98.8 F (37.1 C) 99.5 F (37.5 C) 98.9 F (37.2 C)  TempSrc: Axillary Axillary Axillary Oral  SpO2: 99% 98% 100%   Weight: 97.1 kg (214 lb)     Height:        Intake/Output Summary (Last 24 hours) at 07/15/17 1454 Last data filed at 07/15/17 0700  Gross per 24 hour  Intake          1625.83 ml  Output              500 ml  Net          1125.83 ml   Filed Weights   07/13/17 0313 07/14/17 0232 07/14/17 2330  Weight: 91.6 kg (201 lb 15.1 oz) 95.9 kg (211 lb 6.7 oz) 97.1 kg (214 lb)  Examination:  General: Unresponsive Cardiovascular: RRR, S1/S2 +, no rubs, no gallops Respiratory: CTA bilaterally, no wheezing, no rhonchi Abdominal: Soft, NT, ND, bowel sounds + Neuro: Not responding to verbal or tactile stimuli, open eyes spontaneously. No other significant movements, does not follow commands.  Extremities: Trace edema b/l LE    Data Reviewed: I have personally reviewed following labs and imaging studies  CBC:  Recent Labs Lab 07/14/17 0405 07/15/17 0533  WBC 11.8* 10.1  HGB 9.3* 9.0*  HCT 29.2* 27.6*  MCV 85.9 86.8  PLT 91* 92*   Basic Metabolic Panel:  Recent Labs Lab 07/10/17 0329 07/12/17 0236 07/13/17 0305 07/14/17 0405 07/15/17 0533  NA 137 137 135 138 142  K 3.3* 3.5 3.4* 3.8 4.0  CL 105 105 103 104 105  CO2 26 26 26 28 29   GLUCOSE 197* 160* 166* 158* 161*  BUN 22* 25* 28* 34* 43*  CREATININE 1.61* 1.86* 1.90* 1.91* 2.04*  CALCIUM 7.7* 7.5* 7.4* 7.8* 7.5*   GFR: Estimated Creatinine Clearance: 34.9 mL/min (A) (by C-G formula based  on SCr of 2.04 mg/dL (H)). Liver Function Tests:  Recent Labs Lab 07/10/17 1619  AST 80*  ALT 25  ALKPHOS 68  BILITOT 0.7  PROT 7.8  ALBUMIN 1.6*   No results for input(s): LIPASE, AMYLASE in the last 168 hours.  Recent Labs Lab 07/10/17 1619  AMMONIA 17   Coagulation Profile:  Recent Labs Lab 07/10/17 0329 07/10/17 1619 07/12/17 0236 07/13/17 0305 07/14/17 0405  INR 1.70 1.56 1.69 1.79 1.62   Cardiac Enzymes: No results for input(s): CKTOTAL, CKMB, CKMBINDEX, TROPONINI in the last 168 hours. BNP (last 3 results) No results for input(s): PROBNP in the last 8760 hours. HbA1C: No results for input(s): HGBA1C in the last 72 hours. CBG:  Recent Labs Lab 07/14/17 1926 07/15/17 0000 07/15/17 0429 07/15/17 0719 07/15/17 1223  GLUCAP 146* 140* 152* 152* 149*   Lipid Profile: No results for input(s): CHOL, HDL, LDLCALC, TRIG, CHOLHDL, LDLDIRECT in the last 72 hours. Thyroid Function Tests: No results for input(s): TSH, T4TOTAL, FREET4, T3FREE, THYROIDAB in the last 72 hours. Anemia Panel: No results for input(s): VITAMINB12, FOLATE, FERRITIN, TIBC, IRON, RETICCTPCT in the last 72 hours. Sepsis Labs: No results for input(s): PROCALCITON, LATICACIDVEN in the last 168 hours.  Recent Results (from the past 240 hour(s))  MRSA PCR Screening     Status: Abnormal   Collection Time: 07/06/17  1:00 AM  Result Value Ref Range Status   MRSA by PCR POSITIVE (A) NEGATIVE Final    Comment:        The GeneXpert MRSA Assay (FDA approved for NASAL specimens only), is one component of a comprehensive MRSA colonization surveillance program. It is not intended to diagnose MRSA infection nor to guide or monitor treatment for MRSA infections. RESULT CALLED TO, READ BACK BY AND VERIFIED WITH: M. WOOD 0651 07.31.2018 N. MORRIS          Radiology Studies: No results found.    Scheduled Meds: . citalopram  20 mg Oral Daily  . insulin aspart  0-9 Units Subcutaneous  Q4H  . latanoprost  1 drop Both Eyes QHS  . levothyroxine  100 mcg Intravenous Daily  . mouth rinse  15 mL Mouth Rinse BID  . senna  1 tablet Oral Daily  . silver sulfADIAZINE   Topical Daily  . tamsulosin  0.4 mg Oral QPC supper   Continuous Infusions: . feeding supplement (GLUCERNA 1.2 CAL) 1,000 mL (07/15/17 0629)  LOS: 10 days    Time spent: Total of 15 minutes spent with pt, greater than 50% of which was spent in discussion of  treatment, counseling and coordination of care   Latrelle DodrillEdwin Silva, MD Pager: Text Page via [email or web address]  [phone]  If 7PM-7AM, please contact night-coverage [email or web address] Password [email or web address] 07/15/2017, 2:54 PM

## 2017-07-16 LAB — GLUCOSE, CAPILLARY
GLUCOSE-CAPILLARY: 133 mg/dL — AB (ref 65–99)
GLUCOSE-CAPILLARY: 146 mg/dL — AB (ref 65–99)
GLUCOSE-CAPILLARY: 147 mg/dL — AB (ref 65–99)
GLUCOSE-CAPILLARY: 169 mg/dL — AB (ref 65–99)
GLUCOSE-CAPILLARY: 170 mg/dL — AB (ref 65–99)
Glucose-Capillary: 182 mg/dL — ABNORMAL HIGH (ref 65–99)

## 2017-07-16 LAB — BASIC METABOLIC PANEL
ANION GAP: 6 (ref 5–15)
BUN: 48 mg/dL — ABNORMAL HIGH (ref 6–20)
CALCIUM: 7.6 mg/dL — AB (ref 8.9–10.3)
CO2: 27 mmol/L (ref 22–32)
CREATININE: 2.05 mg/dL — AB (ref 0.61–1.24)
Chloride: 105 mmol/L (ref 101–111)
GFR calc non Af Amer: 29 mL/min — ABNORMAL LOW (ref >60)
GFR, EST AFRICAN AMERICAN: 33 mL/min — AB (ref >60)
Glucose, Bld: 161 mg/dL — ABNORMAL HIGH (ref 65–99)
Potassium: 4.2 mmol/L (ref 3.5–5.1)
SODIUM: 138 mmol/L (ref 135–145)

## 2017-07-16 MED ORDER — LORAZEPAM 2 MG/ML IJ SOLN
0.5000 mg | Freq: Once | INTRAMUSCULAR | Status: AC
  Administered 2017-07-17: 0.5 mg via INTRAVENOUS
  Filled 2017-07-16: qty 1

## 2017-07-16 MED ORDER — COLLAGENASE 250 UNIT/GM EX OINT
TOPICAL_OINTMENT | Freq: Every day | CUTANEOUS | Status: DC
  Administered 2017-07-16 – 2017-07-20 (×5): via TOPICAL
  Administered 2017-07-21: 1 via TOPICAL
  Administered 2017-07-22 – 2017-07-23 (×2): via TOPICAL
  Administered 2017-07-24: 1 via TOPICAL
  Administered 2017-07-25: 10:00:00 via TOPICAL
  Filled 2017-07-16 (×2): qty 30

## 2017-07-16 MED ORDER — LEVOTHYROXINE SODIUM 100 MCG PO TABS
200.0000 ug | ORAL_TABLET | Freq: Every day | ORAL | Status: DC
  Administered 2017-07-16 – 2017-07-21 (×5): 200 ug via NASOGASTRIC
  Filled 2017-07-16 (×6): qty 2

## 2017-07-16 MED ORDER — MORPHINE SULFATE (PF) 4 MG/ML IV SOLN
2.0000 mg | Freq: Once | INTRAVENOUS | Status: AC
Start: 2017-07-16 — End: 2017-07-16
  Administered 2017-07-16: 2 mg via INTRAVENOUS
  Filled 2017-07-16: qty 1

## 2017-07-16 NOTE — Consult Note (Addendum)
WOC Nurse wound consult note WOC consult was previously performed on 7/31 for bilat legs.   Reason for Consult: Today, WOC consult is requested for sacrum. Wound type: Dark purple/black eschar indicates a previous deep tissue injury which is evolving into an unstageable pressure injury Pressure Injury POA: No Measurement: 10X8cm Wound bed: loose peeling black skin at wound edges, slough and eschar tightly adhered in the center of the wound Drainage (amount, consistency, odor) Small amt tan drainage, some odor Dressing procedure/placement/frequency: EMR indicates that family is discussing possible comfort care goals. Santyl ointment for enzymatic debridement, air mattress for pressure reduction.  If aggressive care is desired, then hydrotherapy can be started. No family present to discuss plan of care and patient does not appear to understand.  Cammie Mcgeeawn Saathvik Every MSN, RN, CWOCN, [place]-AP, CNS [phone]

## 2017-07-16 NOTE — Progress Notes (Addendum)
Nursing report called and given to [place], [place]. All questions answered and addressed. Informed RN of unstageable pressure injury to sacrum along with new wound consult/recommendations. Santyl/dressing just applied prior to transfer. Telemetry discontinued per MD order at transfer. Family updated by night RN, Marchelle FolksAmanda. Transferred to 6N14 via bed by NT.

## 2017-07-16 NOTE — Progress Notes (Signed)
PROGRESS NOTE [facility]   Garrett Bradley   OZH:[id number] DOB: 16-Jul-1934  DOA: July 18, 2017 PCP: Assunta Found, MD   Brief Narrative:  Patient is a 83 year old male with hypothyroidism, hypertension, BPH, diabetes presented due to confusion, acute encephalopathy. Family had called EMS because patient did not answer the door. Per EMS patient was sitting in the chair, soiled in urine and stool. Patient had Hospital band on patient's arm dated December 2017 and med bottles were dated the same date and were empty. Patient reported that he had not been changed in 2 weeks. Upon ED evaluation CT of the head was done which showed acute CVA. Patient was admitted and neurology was consulted for further workup. During hospital stay patient developed atrial fibrillation for which was recommended to start patient on oral anticoagulation, although subsequently patient had another acute stroke which ended up making the patient unresponsive and comatose. Palliative care team has been involved trying to discuss goals of care with family.   Subjective: Patient remains unresponsive, attempted to call family 2 left message with [facility]. Prior palliative medicine discussion there trying to have family meeting and able to come up with a decision.  Assessment & Plan: Acute CVA (cerebral vascular accident) (HCC) With acute metabolic encephalopathy - clinically remains unchanged.  - CT head showed recent appearing infarct in the left medial occipital lobe, early acute infarct in the left parietal lobe. - MRI showed a left PCA territory acute infarct with associated diffuse petechial hemorrhage throughout the infarct site. Occlusion of the left posterior cerebral artery at the mid P2 segment. Scattered subcentimeter foci of acute ischemia within both cerebral hemispheres, left thalamus and left anterior temporal lobe, parietal lobe and both frontal lobes suggesting central cardioembolic process - 2-D echo showed  EF 35-40%, moderate diffuse hypokinesis, intraventricular conduction delay  - Carotid Doppler showed no significant ICA stenosis - Neurology was consulted, recommended aspirin - LDL 62, on Lipitor 20 mg daily  - Cardiology was consulted, patient seen by Dr Elease Hashimoto, ECG showed atrial fibrillation, occasional normal sinus rhythm, - EEG showed moderate diffuse slowing of electro cerebral activity, no spikes or paroxysmal activity, no epileptiform activity on this recording - Per neurology, continue aspirin 325 mg for now. Due to Atrial fibrillation, recommend anticoagulation for stroke prevention in 7 days due to moderate-sized infarct and petechial hemorrhage. Once [facility] started aspirin can be discontinued. - on 8/3, patient noted to be having weakness more in the left upper arm, CT head was repeated which showed acute hemorrhagic infarct in the left occipital lobe, new areas of acute infarction in the cerebellum bilaterally and in the right occipital lobe.  -TSH was 6.2, free T4 0.9 , neurology placed patient on Synthroid 100 g daily, rule out myxedema coma -patient continues with very poor prognosis, given now is not responsive, nutrition is an issue, palliative care has been consulted to discuss goals of care, I do not suspect further recovery in this patient. I waiting for family to make a decision on call of care. Patient's family is out of town and they're planning to have a meeting this afternoon to come into the conclusion. I continue to recommend hospice/comfort care.   Mildly elevated troponins - Cardiology was consulted deemed 2/2 demand ischemia and no further workup is recommended at this time  - 2-D echo showed EF of 35-40% with moderate diffuse hypokinesis  Acute Systolic CHF - 2-D echo showed EF of 35-40%, cardiology consulted - Per cardiology poor candidate for invasive cardiac procedures,  will treat with medical management for CHF  Failure to thrive (adult) - Patient reportedly  found in feces and urine with Hospital bracelet from December 2017  MRSA  UTI - Urine culture showing more than 100,000 colonies of staph aureus -Completed Rocephin for 5 days and  vancomycin for 9 days  Essential hypertension - Permissive hypertension was allows given the acute stroke, now BP has normalized without further medical intervention   Diabetes mellitus type 2  -Hemoglobin A1c 8.0  -Placed on SSI q4hrs    Hypothyroidism - TSH 6.2, continue Synthroid. Unclear if patient was taking his medications, does not appear to have filled after December 2017 - Synthroid restarted, repeat thyroid testing in 4-6 weeks  Moderate malnutrition - Nutrition consulted, continue supplementation  Acute kidney injury - continues to worsen despite IV fluids -Likely due to poor oral intake -Palliative goals of care pending   Thrush -Continue Diflucan  Chronic normocytic anemia - Hemoglobin appears to be at baseline  DVT prophylaxis: SCD's  Code Status: DNR Family Communication: No family members present  Disposition Plan: Poor prognosis, awaiting family decision on hospice vs further care   Consultants:   Neurology   Palliative care   Procedures:   MRI/MRA brain   2D ECHO   Antimicrobials: Anti-infectives    Start     Dose/Rate Route Frequency Ordered Stop   07/12/17 0600  vancomycin (VANCOCIN) IVPB 750 mg/150 ml premix  Status:  Discontinued     750 mg 150 mL/hr over 60 Minutes Intravenous Every 24 hours 07/11/17 1244 07/14/17 1732   07/10/17 0600  vancomycin (VANCOCIN) IVPB 1000 mg/200 mL premix  Status:  Discontinued     1,000 mg 200 mL/hr over 60 Minutes Intravenous Every 24 hours 07/09/17 2332 07/11/17 1244   07/06/17 2200  vancomycin (VANCOCIN) 1,250 mg in sodium chloride 0.9 % 250 mL IVPB  Status:  Discontinued     1,250 mg 166.7 mL/hr over 90 Minutes Intravenous Every 24 hours 07/06/17 1020 07/09/17 2329   07/06/17 1000  vancomycin (VANCOCIN) IVPB 1000  mg/200 mL premix  Status:  Discontinued     1,000 mg 200 mL/hr over 60 Minutes Intravenous Every 12 hours 06/12/2017 2138 07/06/17 1017   07/06/17 1000  fluconazole (DIFLUCAN) tablet 100 mg     100 mg Oral Daily 06/27/2017 2319 07/09/17 0959   06/14/2017 2200  vancomycin (VANCOCIN) 1,500 mg in sodium chloride 0.9 % 500 mL IVPB  Status:  Discontinued     1,500 mg 250 mL/hr over 120 Minutes Intravenous  Once 07/03/2017 2135 07/06/17 1017   06/26/2017 2145  cefTRIAXone (ROCEPHIN) 1 g in dextrose 5 % 50 mL IVPB  Status:  Discontinued     1 g 100 mL/hr over 30 Minutes Intravenous Every 24 hours 06/09/2017 2144 07/09/17 1224       Objective: Vitals:   07/15/17 2345 07/16/17 0318 07/16/17 0615 07/16/17 0759  BP: 131/82 140/61  (!) 157/65  Pulse:    (!) 44  Resp: (!) 28 (!) 26  17  Temp: 99.3 F (37.4 C) 98.9 F (37.2 C)  99.8 F (37.7 C)  TempSrc: Oral Oral  Axillary  SpO2: 96% 96%  96%  Weight:   94.2 kg (207 lb 10.8 oz)   Height:        Intake/Output Summary (Last 24 hours) at 07/16/17 1051 Last data filed at 07/16/17 0600  Gross per 24 hour  Intake  780 ml  Output              700 ml  Net               80 ml   Filed Weights   07/14/17 0232 07/14/17 2330 07/16/17 0615  Weight: 95.9 kg (211 lb 6.7 oz) 97.1 kg (214 lb) 94.2 kg (207 lb 10.8 oz)    Examination: Exam remains unchanged from 07/15/2017  General: Unresponsive Cardiovascular: RRR, S1/S2 +, no rubs, no gallops Respiratory: CTA bilaterally, no wheezing, no rhonchi Abdominal: Soft, NT, ND, bowel sounds + Neuro: Not responding to verbal or tactile stimuli, open eyes spontaneously. No other significant movements, does not follow commands.  Extremities: Trace edema b/l LE    Data Reviewed: I have personally reviewed following labs and imaging studies  CBC:  Recent Labs Lab 07/14/17 0405 07/15/17 0533  WBC 11.8* 10.1  HGB 9.3* 9.0*  HCT 29.2* 27.6*  MCV 85.9 86.8  PLT 91* 92*   Basic Metabolic  Panel:  Recent Labs Lab 07/12/17 0236 07/13/17 0305 07/14/17 0405 07/15/17 0533 07/16/17 0238  NA 137 135 138 142 138  K 3.5 3.4* 3.8 4.0 4.2  CL 105 103 104 105 105  CO2 26 26 28 29 27   GLUCOSE 160* 166* 158* 161* 161*  BUN 25* 28* 34* 43* 48*  CREATININE 1.86* 1.90* 1.91* 2.04* 2.05*  CALCIUM 7.5* 7.4* 7.8* 7.5* 7.6*   GFR: Estimated Creatinine Clearance: 34.7 mL/min (A) (by C-G formula based on SCr of 2.05 mg/dL (H)). Liver Function Tests:  Recent Labs Lab 07/10/17 1619  AST 80*  ALT 25  ALKPHOS 68  BILITOT 0.7  PROT 7.8  ALBUMIN 1.6*   No results for input(s): LIPASE, AMYLASE in the last 168 hours.  Recent Labs Lab 07/10/17 1619  AMMONIA 17   Coagulation Profile:  Recent Labs Lab 07/10/17 0329 07/10/17 1619 07/12/17 0236 07/13/17 0305 07/14/17 0405  INR 1.70 1.56 1.69 1.79 1.62   Cardiac Enzymes: No results for input(s): CKTOTAL, CKMB, CKMBINDEX, TROPONINI in the last 168 hours. BNP (last 3 results) No results for input(s): PROBNP in the last 8760 hours. HbA1C: No results for input(s): HGBA1C in the last 72 hours. CBG:  Recent Labs Lab 07/15/17 1617 07/15/17 1934 07/15/17 2344 07/16/17 0317 07/16/17 0755  GLUCAP 159* 160* 170* 146* 133*   Lipid Profile: No results for input(s): CHOL, HDL, LDLCALC, TRIG, CHOLHDL, LDLDIRECT in the last 72 hours. Thyroid Function Tests: No results for input(s): TSH, T4TOTAL, FREET4, T3FREE, THYROIDAB in the last 72 hours. Anemia Panel: No results for input(s): VITAMINB12, FOLATE, FERRITIN, TIBC, IRON, RETICCTPCT in the last 72 hours. Sepsis Labs: No results for input(s): PROCALCITON, LATICACIDVEN in the last 168 hours.  No results found for this or any previous visit (from the past 240 hour(s)).       Radiology Studies: No results found.    Scheduled Meds: . citalopram  20 mg Oral Daily  . insulin aspart  0-9 Units Subcutaneous Q4H  . latanoprost  1 drop Both Eyes QHS  . levothyroxine  100  mcg Intravenous Daily  . mouth rinse  15 mL Mouth Rinse BID  . senna  1 tablet Oral Daily  . silver sulfADIAZINE   Topical Daily  . tamsulosin  0.4 mg Oral QPC supper   Continuous Infusions: . feeding supplement (GLUCERNA 1.2 CAL) 1,000 mL (07/15/17 2349)     LOS: 11 days    Time spent: Total of 15 minutes spent  with pt, greater than 50% of which was spent in discussion of  treatment, counseling and coordination of care   Latrelle DodrillEdwin Silva, MD Pager: Text Page via [email or web address]  [phone]  If 7PM-7AM, please contact night-coverage [email or web address] Password [email or web address] 07/16/2017, 10:51 AM

## 2017-07-16 NOTE — Progress Notes (Signed)
Palliative Medicine RN Note: Daughter Maxine GlennMonica indicated yesterday that she would talk to family and call us with decision re: hospice yesterday. No call rec'd, and no family at bedside today. Spoke with Dr Edward JollySilva. Left message for daughter Maxine GlennMonica. Called son [name]; brother came in very late last night, so they did not have a family meeting. [name] said they will have family meeting today and tomorrow. I explained that we need a decision soon and that they will likely start getting more phone calls this afternoon re d/c plan.  Daughter Maxine GlennMonica called back. Explained the need for a decision so d/c plan can be in place. Let her know that SW, CM , or attending MD may also call them for plan. She verbalized understanding and said she would call PMT this afternoon with decision.   Margret ChanceMelanie G. Vernel Langenderfer, RN, BSN, [facility] 07/16/2017 11:13 AM Cell [phone] 8:00-4:00 Monday-Friday Office [phone]

## 2017-07-16 NOTE — Progress Notes (Addendum)
Pt from 71M to 6N29. Pt is unresponsive. VSS. Tube feeding running. IV 20G in Right FA SL. No family at bedside at this time.Sacral wound and bilateral wounds noted; dressing clean dry and intact. Bed alarm is on. Bed on lowest position.  Call bell with in reach. Will continue to monitor pt.  Addendum: pt's daughter at bedside updated pt's status within RN's scope of practice. Explained to daughter MD will be able to give her more detailed update regarding pt's prognosis. Daughter verbalized understanding. Will continue to monitor.

## 2017-07-17 LAB — GLUCOSE, CAPILLARY
GLUCOSE-CAPILLARY: 148 mg/dL — AB (ref 65–99)
GLUCOSE-CAPILLARY: 158 mg/dL — AB (ref 65–99)
GLUCOSE-CAPILLARY: 161 mg/dL — AB (ref 65–99)
GLUCOSE-CAPILLARY: 165 mg/dL — AB (ref 65–99)
GLUCOSE-CAPILLARY: 177 mg/dL — AB (ref 65–99)
Glucose-Capillary: 168 mg/dL — ABNORMAL HIGH (ref 65–99)

## 2017-07-17 NOTE — Progress Notes (Signed)
PMT no charge note  Received phone call from daughter Garrett MiuMonica Bradley [phone]. Daughter states that the family would like for the patient to receive a PEG tube and for him to be transferred to SNF.  At this time, family does not want comfort measures/hospice.  Daughter also wishes to get in touch with CSW/case management regarding appropriate skilled nursing facilities for the patient to go to.   We discussed about the patient's current condition in detail. While daughter Garrett GlennMonica is thankful for the information she has received, she wishes for PEG tube placement and SNF on D/C.   15 minutes spent.  Rosalin HawkingZeba Michiah Mudry MD [phone]  [phone]  Palliative medicine team.

## 2017-07-17 NOTE — Progress Notes (Signed)
PROGRESS NOTE [facility]   [name]   ZOX:[id number] DOB: 10/31/1934  DOA: 06/09/2017 PCP: [name], [name], MD   Brief Narrative:  Patient is a 83 year old male with hypothyroidism, hypertension, BPH, diabetes presented due to confusion, acute encephalopathy. Family had called EMS because patient did not answer the door. Per EMS patient was sitting in the chair, soiled in urine and stool. Patient had Hospital band on patient's arm dated December 2017 and med bottles were dated the same date and were empty. Patient reported that he had not been changed in 2 weeks. Upon ED evaluation CT of the head was done which showed acute CVA. Patient was admitted and neurology was consulted for further workup. During hospital stay patient developed atrial fibrillation for which was recommended to start patient on oral anticoagulation, although subsequently patient had another acute stroke which ended up making the patient unresponsive and comatose. Palliative care team has been involved trying to discuss goals of care with family.   Subjective: Spoke with daughter Maxine GlennMonica and son [name], discussed, condition and prognosis in detail, they have made the decision to move forward with PEG tube placement and d/c to SNF.   Assessment & Plan: Acute CVA (cerebral vascular accident) (HCC) With acute metabolic encephalopathy - clinically remains unchanged.  - CT head showed recent appearing infarct in the left medial occipital lobe, early acute infarct in the left parietal lobe. - MRI showed a left PCA territory acute infarct with associated diffuse petechial hemorrhage throughout the infarct site. Occlusion of the left posterior cerebral artery at the mid P2 segment. Scattered subcentimeter foci of acute ischemia within both cerebral hemispheres, left thalamus and left anterior temporal lobe, parietal lobe and both frontal lobes suggesting central cardioembolic process - 2-D echo showed EF 35-40%, moderate  diffuse hypokinesis, intraventricular conduction delay  - Carotid Doppler showed no significant ICA stenosis - Neurology was consulted, recommended aspirin - LDL 62, on Lipitor 20 mg daily  - Cardiology was consulted, patient seen by Dr Elease HashimotoNahser, ECG showed atrial fibrillation, occasional normal sinus rhythm, - EEG showed moderate diffuse slowing of electro cerebral activity, no spikes or paroxysmal activity, no epileptiform activity on this recording - Per neurology, continue aspirin 325 mg for now. Due to Atrial fibrillation, recommend anticoagulation for stroke prevention in 7 days due to moderate-sized infarct and petechial hemorrhage. Once [facility] started aspirin can be discontinued. - on 8/3, patient noted to be having weakness more in the left upper arm, CT head was repeated which showed acute hemorrhagic infarct in the left occipital lobe, new areas of acute infarction in the cerebellum bilaterally and in the right occipital lobe.  -TSH was 6.2, free T4 0.9 , neurology placed patient on Synthroid 100 g daily, rule out myxedema coma -patient continues with very poor prognosis, given now is not responsive, nutrition is an issue, palliative care has been consulted to discuss goals of care, I do not suspect further recovery in this patient. I waiting for family to make a decision on call of care. Patient's family is out of town and they're planning to have a meeting this afternoon to come into the conclusion. I continue to recommend hospice/comfort care.   Mildly elevated troponins - Cardiology was consulted deemed 2/2 demand ischemia and no further workup is recommended at this time  - 2-D echo showed EF of 35-40% with moderate diffuse hypokinesis  Acute Systolic CHF - 2-D echo showed EF of 35-40%, cardiology consulted - Per cardiology poor candidate for invasive cardiac procedures,  will treat with medical management for CHF  Failure to thrive (adult) - Patient reportedly found in feces and urine  with Hospital bracelet from December 2017  MRSA  UTI -Urine culture showing more than 100,000 colonies of staph aureus -Completed Rocephin for 5 days and  vancomycin for 9 days  Essential hypertension - Permissive hypertension was allows given the acute stroke, now BP has normalized without further medical intervention   Diabetes mellitus type 2  -Hemoglobin A1c 8.0  -Placed on SSI q4hrs    Hypothyroidism - TSH 6.2, continue Synthroid. Unclear if patient was taking his medications, does not appear to have filled after December 2017 - Synthroid restarted, repeat thyroid testing in 4-6 weeks  Moderate malnutrition - Nutrition consulted, continue supplementation - IR consulted for PEG placement   Acute kidney injury -  -Likely due to poor oral intake -Check BMP in AM   Thrush -Continue Diflucan  Chronic normocytic anemia - Hemoglobin appears to be at baseline  DVT prophylaxis: SCD's  Code Status: DNR Family Communication: No family members present  Disposition Plan: PEG tube placement and SNF   Consultants:   Neurology   Palliative care   Procedures:   MRI/MRA brain   2D ECHO   Antimicrobials: Anti-infectives    Start     Dose/Rate Route Frequency Ordered Stop   07/12/17 0600  vancomycin (VANCOCIN) IVPB 750 mg/150 ml premix  Status:  Discontinued     750 mg 150 mL/hr over 60 Minutes Intravenous Every 24 hours 07/11/17 1244 07/14/17 1732   07/10/17 0600  vancomycin (VANCOCIN) IVPB 1000 mg/200 mL premix  Status:  Discontinued     1,000 mg 200 mL/hr over 60 Minutes Intravenous Every 24 hours 07/09/17 2332 07/11/17 1244   07/06/17 2200  vancomycin (VANCOCIN) 1,250 mg in sodium chloride 0.9 % 250 mL IVPB  Status:  Discontinued     1,250 mg 166.7 mL/hr over 90 Minutes Intravenous Every 24 hours 07/06/17 1020 07/09/17 2329   07/06/17 1000  vancomycin (VANCOCIN) IVPB 1000 mg/200 mL premix  Status:  Discontinued     1,000 mg 200 mL/hr over 60 Minutes  Intravenous Every 12 hours 06/19/2017 2138 07/06/17 1017   07/06/17 1000  fluconazole (DIFLUCAN) tablet 100 mg     100 mg Oral Daily 06/09/2017 2319 07/09/17 0959   06/13/2017 2200  vancomycin (VANCOCIN) 1,500 mg in sodium chloride 0.9 % 500 mL IVPB  Status:  Discontinued     1,500 mg 250 mL/hr over 120 Minutes Intravenous  Once 06/24/2017 2135 07/06/17 1017   07/04/2017 2145  cefTRIAXone (ROCEPHIN) 1 g in dextrose 5 % 50 mL IVPB  Status:  Discontinued     1 g 100 mL/hr over 30 Minutes Intravenous Every 24 hours 06/12/2017 2144 07/09/17 1224       Objective: Vitals:   07/16/17 2240 07/17/17 0444 07/17/17 1021 07/17/17 1412  BP: (!) 150/63 136/86 126/74 (!) 150/81  Pulse: 60 61 62 87  Resp: (!) 24 (!) 21 20 20   Temp: 99 F (37.2 C) 98.6 F (37 C) 99.1 F (37.3 C) 99.3 F (37.4 C)  TempSrc: Oral Oral Oral Oral  SpO2: 97% 98% 93% 98%  Weight:      Height:        Intake/Output Summary (Last 24 hours) at 07/17/17 1619 Last data filed at 07/17/17 1019  Gross per 24 hour  Intake          1545.66 ml  Output  1150 ml  Net           395.66 ml   Filed Weights   07/14/17 0232 07/14/17 2330 07/16/17 0615  Weight: 95.9 kg (211 lb 6.7 oz) 97.1 kg (214 lb) 94.2 kg (207 lb 10.8 oz)    Examination: Exam remains unchanged from 07/16/2017  General: Unresponsive Cardiovascular: RRR, S1/S2 +, no rubs, no gallops Respiratory: CTA bilaterally, no wheezing, no rhonchi Abdominal: Soft, NT, ND, bowel sounds + Neuro: Not responding to verbal or tactile stimuli, open eyes spontaneously. No other significant movements, does not follow commands.  Extremities: Trace edema b/l LE    Data Reviewed: I have personally reviewed following labs and imaging studies  CBC:  Recent Labs Lab 07/14/17 0405 07/15/17 0533  WBC 11.8* 10.1  HGB 9.3* 9.0*  HCT 29.2* 27.6*  MCV 85.9 86.8  PLT 91* 92*   Basic Metabolic Panel:  Recent Labs Lab 07/12/17 0236 07/13/17 0305 07/14/17 0405  07/15/17 0533 07/16/17 0238  NA 137 135 138 142 138  K 3.5 3.4* 3.8 4.0 4.2  CL 105 103 104 105 105  CO2 26 26 28 29 27   GLUCOSE 160* 166* 158* 161* 161*  BUN 25* 28* 34* 43* 48*  CREATININE 1.86* 1.90* 1.91* 2.04* 2.05*  CALCIUM 7.5* 7.4* 7.8* 7.5* 7.6*   GFR: Estimated Creatinine Clearance: 34.7 mL/min (A) (by C-G formula based on SCr of 2.05 mg/dL (H)). Liver Function Tests: No results for input(s): AST, ALT, ALKPHOS, BILITOT, PROT, ALBUMIN in the last 168 hours. No results for input(s): LIPASE, AMYLASE in the last 168 hours. No results for input(s): AMMONIA in the last 168 hours. Coagulation Profile:  Recent Labs Lab 07/12/17 0236 07/13/17 0305 07/14/17 0405  INR 1.69 1.79 1.62   Cardiac Enzymes: No results for input(s): CKTOTAL, CKMB, CKMBINDEX, TROPONINI in the last 168 hours. BNP (last 3 results) No results for input(s): PROBNP in the last 8760 hours. HbA1C: No results for input(s): HGBA1C in the last 72 hours. CBG:  Recent Labs Lab 07/16/17 2031 07/17/17 0008 07/17/17 0441 07/17/17 0803 07/17/17 1225  GLUCAP 147* 177* 165* 158* 168*   Lipid Profile: No results for input(s): CHOL, HDL, LDLCALC, TRIG, CHOLHDL, LDLDIRECT in the last 72 hours. Thyroid Function Tests: No results for input(s): TSH, T4TOTAL, FREET4, T3FREE, THYROIDAB in the last 72 hours. Anemia Panel: No results for input(s): VITAMINB12, FOLATE, FERRITIN, TIBC, IRON, RETICCTPCT in the last 72 hours. Sepsis Labs: No results for input(s): PROCALCITON, LATICACIDVEN in the last 168 hours.  No results found for this or any previous visit (from the past 240 hour(s)).    Radiology Studies: No results found.    Scheduled Meds: . citalopram  20 mg Oral Daily  . collagenase   Topical Daily  . insulin aspart  0-9 Units Subcutaneous Q4H  . latanoprost  1 drop Both Eyes QHS  . levothyroxine  200 mcg Per NG tube QAC breakfast  . mouth rinse  15 mL Mouth Rinse BID  . senna  1 tablet Oral Daily   . silver sulfADIAZINE   Topical Daily  . tamsulosin  0.4 mg Oral QPC supper   Continuous Infusions: . feeding supplement (GLUCERNA 1.2 CAL) 1,000 mL (07/17/17 0530)     LOS: 12 days   Time spent: Total of 15 minutes spent with pt, greater than 50% of which was spent in discussion of  treatment, counseling and coordination of care   Latrelle Dodrill, MD Pager: Text Page via [email or web address]  [phone]  If  7PM-7AM, please contact night-coverage [email or web address] Password [facility] 07/17/2017, 4:19 PM

## 2017-07-18 DIAGNOSIS — L8990 Pressure ulcer of unspecified site, unspecified stage: Secondary | ICD-10-CM | POA: Insufficient documentation

## 2017-07-18 DIAGNOSIS — G7281 Critical illness myopathy: Secondary | ICD-10-CM

## 2017-07-18 LAB — GLUCOSE, CAPILLARY
GLUCOSE-CAPILLARY: 141 mg/dL — AB (ref 65–99)
GLUCOSE-CAPILLARY: 151 mg/dL — AB (ref 65–99)
GLUCOSE-CAPILLARY: 155 mg/dL — AB (ref 65–99)
GLUCOSE-CAPILLARY: 169 mg/dL — AB (ref 65–99)
GLUCOSE-CAPILLARY: 202 mg/dL — AB (ref 65–99)
Glucose-Capillary: 161 mg/dL — ABNORMAL HIGH (ref 65–99)
Glucose-Capillary: 157 mg/dL — ABNORMAL HIGH (ref 65–99)
Glucose-Capillary: 167 mg/dL — ABNORMAL HIGH (ref 65–99)

## 2017-07-18 NOTE — Consult Note (Signed)
Chief Complaint: Patient was seen in consultation today for percutaneous gastric tube placement Chief Complaint  Patient presents with  . Failure To Thrive   at the request of Dr Lestine Box  Supervising Physician: Jolaine Click  Patient Status: [facility] - In-pt  History of Present Illness: Garrett Bradley is a 83 y.o. male   Pt found unresponsive in chair at home when wouldn't answer door. Hospital arm bracelet from 11/2016 and in urine and feces. To ED for encephalopathy; AMS New CVA x 2 per work up Dysphagia No response Palliative meeting -- family does want to move ahead with Gastric tube and SNF Malnutrition; deconditioning Need for long term care Request for percutaneous gastric tube placement Dr Bonnielee Haff has reviewed imaging from 03/2016 and feels appropriate anatomically for procedure. Scheduled possibly for 8/13 Unable to reach family by phone this am Consent in chart and read if calls back- RN aware  Past Medical History:  Diagnosis Date  . Depression   . Diabetes mellitus   . Enlarged prostate    Status post TURP; self caths @ home  . Hypertension   . Hypothyroidism   . Renal disorder     Past Surgical History:  Procedure Laterality Date  . HERNIA REPAIR    . TRANSURETHRAL RESECTION OF PROSTATE  09/22/2011   Procedure: TRANSURETHRAL RESECTION OF THE PROSTATE (TURP);  Surgeon: Ky Barban;  Location: [facility];  Service: Urology;  Laterality: N/A;    Allergies: Thiazide-type diuretics  Medications: Prior to Admission medications   Medication Sig Start Date End Date Taking? Authorizing Provider  ALPRAZolam Prudy Feeler) 1 MG tablet Take 1 tablet (1 mg total) by mouth at bedtime as needed for sleep. 03/23/16   Standley Brooking, MD  citalopram (CELEXA) 20 MG tablet Take 1 tablet (20 mg total) by mouth daily. Patient not taking: Reported on 07/06/2017 03/23/16   Standley Brooking, MD  fish oil-omega-3 fatty acids 1000 MG capsule Take 1 capsule (1 g total) by  mouth 2 (two) times daily. Patient not taking: Reported on 07/06/2017 10/02/11   Christiane Ha, MD  furosemide (LASIX) 20 MG tablet TAKE 40 MG DAILY AS NEEDED FOR LEG SWELLING. Patient not taking: Reported on 07/06/2017 06/19/16   Erick Blinks, MD  senna (SENOKOT) 8.6 MG TABS tablet Take 1 tablet (8.6 mg total) by mouth daily. Patient not taking: Reported on 07/06/2017 03/11/16   Lavera Guise, MD     Family History  Problem Relation Age of Onset  . Hypertension Mother   . Cerebral aneurysm Mother   . Hypertension Brother   . Heart disease Father        Heart attack  . Cancer Neg Hx   . Diabetes Neg Hx        only among cousins    Social History   Social History  . Marital status: Legally Separated    Spouse name: N/A  . Number of children: N/A  . Years of education: N/A   Social History Main Topics  . Smoking status: Never Smoker  . Smokeless tobacco: Never Used  . Alcohol use No  . Drug use: No  . Sexual activity: Not Currently   Other Topics Concern  . None   Social History Narrative  . None    Review of Systems: A 12 point ROS discussed and pertinent positives are indicated in the HPI above.  All other systems are negative.  Review of Systems  Constitutional: Positive for activity change.  Negative for fever.  Psychiatric/Behavioral: Positive for confusion and decreased concentration.    Vital Signs: BP 132/72 (BP Location: Left Arm)   Pulse 96   Temp 99 F (37.2 C) (Axillary)   Resp 20   Ht 6\' 4"  (1.93 m)   Wt 216 lb 7.9 oz (98.2 kg)   SpO2 97%   BMI 26.35 kg/m   Physical Exam  Cardiovascular:  No murmur heard. Irreg rate  Pulmonary/Chest: Effort normal. He has wheezes.  Abdominal: Soft. Bowel sounds are normal.  Musculoskeletal:  No response  Neurological:  No response  Skin: Skin is warm.  Psychiatric:  Will need consent from family Message left on machine  Nursing note and vitals reviewed.   Mallampati Score:  MD  Evaluation Airway: Other (comments) Airway comments: unable to assess; no response Heart: WNL Abdomen: WNL Chest/ Lungs: WNL ASA  Classification: 3 Mallampati/Airway Score:  (unable to assess)  Imaging: Dg Chest 1 View  Result Date: 2017-07-24 CLINICAL DATA:  Mental status change, incontinence. EXAM: CHEST 1 VIEW COMPARISON:  Chest x-ray of March 16, 2016 FINDINGS: The lungs are adequately inflated. The interstitial markings are mildly increased. There is no alveolar infiltrate or pleural effusion. The heart is top-normal in size. The central pulmonary vascularity is mildly prominent. There are degenerative changes of both shoulders. IMPRESSION: The findings suggest low-grade CHF.  There is no alveolar pneumonia. Electronically Signed   By: David  [place] M.D.   On: Jul 24, 2017 15:41   Ct Head Wo Contrast  Result Date: 07/09/2017 CLINICAL DATA:  Stroke followup EXAM: CT HEAD WITHOUT CONTRAST TECHNIQUE: Contiguous axial images were obtained from the base of the skull through the vertex without intravenous contrast. COMPARISON:  CT and MRI 2017-07-24 FINDINGS: Brain: Acute infarct left posterior cerebral artery territory involving the left occipital lobe and posterior thalamus. There is mild amount of hemorrhage within the infarct, similar to the prior CT and MRI. New areas of hypodensity in the cerebellum bilaterally compatible with acute infarct. No hemorrhage in the posterior fossa. New area of hypodensity right occipital lobe compatible with acute infarct without hemorrhage. Generalized atrophy. Negative for hydrocephalus. Chronic white matter disease. Chronic left frontal infarct. No shift of the midline structures. Vascular: Negative for hyperdense vessel Skull: Negative Sinuses/Orbits: Negative Other: None IMPRESSION: Acute hemorrhagic infarct in the left occipital lobe similar to prior CT and MRI New areas of acute infarct in the cerebellum bilaterally and in the right occipital lobe. Findings  consistent with continued posterior circulation emboli These results will be called to the ordering clinician or representative by the Radiologist Assistant, and communication documented in the PACS or zVision Dashboard. Electronically Signed   By: Marlan Palau M.D.   On: 07/09/2017 16:50   Ct Head Wo Contrast  Result Date: 2017/07/24 CLINICAL DATA:  Confusion with incontinence EXAM: CT HEAD WITHOUT CONTRAST TECHNIQUE: Contiguous axial images were obtained from the base of the skull through the vertex without intravenous contrast. COMPARISON:  July 26, 2014 FINDINGS: Brain: There is mild diffuse atrophy. There is a recent appearing infarct in the medial left occipital lobe with decreased attenuation. There is sulcal effacement in the posterior left parietal lobe, potentially a second focus of early infarct. There is no edema in this area appreciable. There is no mass, hemorrhage, or extra-axial fluid collection. No midline shift. Elsewhere, there is slight small vessel disease in the centra semiovale bilaterally. Vascular: There is subtle increased attenuation in the proximal left middle cerebral artery compared to prior study. This appearance  may represent early hyperdense vessel and may be indicative of early acute infarct in the left middle cerebral artery distribution. There is calcification in each carotid siphon region. Skull: Bony calvarium appears intact. Sinuses/Orbits: Visualized paranasal sinuses are clear. Visualized orbits appear symmetric bilaterally. Other: Mastoid air cells are clear. IMPRESSION: Recent appearing infarct in the left medial occipital lobe. Suspect early acute infarct in the left parietal lobe with questionable early hyperdense vessel change in the left middle cerebral artery. Given these findings, correlation with MR pre and post-contrast advised to further evaluate. No mass or hemorrhage evident. No midline shift. Foci of calcification noted in each carotid siphon.  Electronically Signed   By: Bretta Bang III M.D.   On: 2017/07/12 16:04   Mr Brain Wo Contrast (neuro Protocol)  Result Date: 07-12-2017 CLINICAL DATA:  Altered mental status and weakness EXAM: MRI HEAD WITHOUT CONTRAST MRA HEAD WITHOUT CONTRAST TECHNIQUE: Multiplanar, multiecho pulse sequences of the brain and surrounding structures were obtained without intravenous contrast. Angiographic images of the head were obtained using MRA technique without contrast. COMPARISON:  None. FINDINGS: MRI HEAD FINDINGS Brain: The midline structures are normal. There is a large area of diffusion restriction within the left posterior cerebral artery distribution. There are scattered other small foci of diffusion restriction within both cerebellar hemispheres, the left thalamus and anterior temporal lobe and multiple sites in the left parietal lobe and both frontal lobes. There is multifocal hyperintense T2-weighted signal within the periventricular white matter, most often seen in the setting of chronic microvascular ischemia. No mass lesion. There is petechial hemorrhage throughout the left PCA distribution. No hydrocephalus, age advanced atrophy or lobar predominant volume loss. No dural abnormality or extra-axial collection. Skull and upper cervical spine: The visualized skull base, calvarium, upper cervical spine and extracranial soft tissues are normal. Sinuses/Orbits: No fluid levels or advanced mucosal thickening. No mastoid effusion. Normal orbits. MRA HEAD FINDINGS Intracranial internal carotid arteries: Normal. Anterior cerebral arteries: Normal. Middle cerebral arteries: Normal. Posterior communicating arteries: Present on the right. Posterior cerebral arteries: The left PCA is occluded in its mid P2 segment. The right PCA is normal. Basilar artery: Normal. Vertebral arteries: Codominant Normal. Superior cerebellar arteries: Normal. Anterior inferior cerebellar arteries: Normal. Posterior inferior cerebellar  arteries: Normal. IMPRESSION: 1. Left PCA territory acute infarct with associated diffuse petechial hemorrhage throughout the infarct site, without mass effect. 2. Occlusion of left posterior cerebral artery at the mid P2 segment. 3. Scattered subcentimeter foci of acute ischemia within both cerebellar hemispheres, left thalamus, left anterior temporal lobe, left parietal lobe and both frontal lobes. The distribution suggests a central cardioembolic process. Electronically Signed   By: Deatra Robinson M.D.   On: Jul 12, 2017 17:46   Mr Maxine Glenn Head (cerebral Arteries)  Result Date: 2017/07/12 CLINICAL DATA:  Altered mental status and weakness EXAM: MRI HEAD WITHOUT CONTRAST MRA HEAD WITHOUT CONTRAST TECHNIQUE: Multiplanar, multiecho pulse sequences of the brain and surrounding structures were obtained without intravenous contrast. Angiographic images of the head were obtained using MRA technique without contrast. COMPARISON:  None. FINDINGS: MRI HEAD FINDINGS Brain: The midline structures are normal. There is a large area of diffusion restriction within the left posterior cerebral artery distribution. There are scattered other small foci of diffusion restriction within both cerebellar hemispheres, the left thalamus and anterior temporal lobe and multiple sites in the left parietal lobe and both frontal lobes. There is multifocal hyperintense T2-weighted signal within the periventricular white matter, most often seen in the setting of chronic microvascular ischemia.  No mass lesion. There is petechial hemorrhage throughout the left PCA distribution. No hydrocephalus, age advanced atrophy or lobar predominant volume loss. No dural abnormality or extra-axial collection. Skull and upper cervical spine: The visualized skull base, calvarium, upper cervical spine and extracranial soft tissues are normal. Sinuses/Orbits: No fluid levels or advanced mucosal thickening. No mastoid effusion. Normal orbits. MRA HEAD FINDINGS  Intracranial internal carotid arteries: Normal. Anterior cerebral arteries: Normal. Middle cerebral arteries: Normal. Posterior communicating arteries: Present on the right. Posterior cerebral arteries: The left PCA is occluded in its mid P2 segment. The right PCA is normal. Basilar artery: Normal. Vertebral arteries: Codominant Normal. Superior cerebellar arteries: Normal. Anterior inferior cerebellar arteries: Normal. Posterior inferior cerebellar arteries: Normal. IMPRESSION: 1. Left PCA territory acute infarct with associated diffuse petechial hemorrhage throughout the infarct site, without mass effect. 2. Occlusion of left posterior cerebral artery at the mid P2 segment. 3. Scattered subcentimeter foci of acute ischemia within both cerebellar hemispheres, left thalamus, left anterior temporal lobe, left parietal lobe and both frontal lobes. The distribution suggests a central cardioembolic process. Electronically Signed   By: Deatra RobinsonKevin  Herman M.D.   On: 03/08/2017 17:46    Labs:  CBC:  Recent Labs  05/16/2017 1605 07/07/17 0335 07/14/17 0405 07/15/17 0533  WBC 10.2 8.3 11.8* 10.1  HGB 10.3* 8.5* 9.3* 9.0*  HCT 31.9* 26.6* 29.2* 27.6*  PLT 134* 100* 91* 92*    COAGS:  Recent Labs  07/06/17 0146  07/10/17 1619 07/12/17 0236 07/13/17 0305 07/14/17 0405  INR 1.49  < > 1.56 1.69 1.79 1.62  APTT 33  --  38*  --   --   --   < > = values in this interval not displayed.  BMP:  Recent Labs  07/13/17 0305 07/14/17 0405 07/15/17 0533 07/16/17 0238  NA 135 138 142 138  K 3.4* 3.8 4.0 4.2  CL 103 104 105 105  CO2 26 28 29 27   GLUCOSE 166* 158* 161* 161*  BUN 28* 34* 43* 48*  CALCIUM 7.4* 7.8* 7.5* 7.6*  CREATININE 1.90* 1.91* 2.04* 2.05*  GFRNONAA 31* 31* 29* 29*  GFRAA 36* 36* 33* 33*    LIVER FUNCTION TESTS:  Recent Labs  05/16/2017 1605 07/10/17 1619  BILITOT 0.9 0.7  AST 29 80*  ALT 17 25  ALKPHOS 64 68  PROT 9.3* 7.8  ALBUMIN 2.4* 1.6*    TUMOR MARKERS: No  results for input(s): AFPTM, CEA, CA199, CHROMGRNA in the last 8760 hours.  Assessment and Plan:  AMS; encephalopathy Deconditioning CVA x 2 Dysphagia and need for long term care Scheduled for percutaneous gastric tube placement Will need consent from family---not able to reach them this am via phone  Thank you for this interesting consult.  I greatly enjoyed meeting Garrett Bradley and look forward to participating in their care.  A copy of this report was sent to the requesting provider on this date.  Electronically Signed: Robet LeuURPIN,Sachit Gilman A, PA-C 07/18/2017, 9:43 AM   I spent a total of 40 Minutes    in face to face in clinical consultation, greater than 50% of which was counseling/coordinating care for percutaneous gastric tube placement

## 2017-07-18 NOTE — Progress Notes (Addendum)
PROGRESS NOTE [facility]   Garrett Bradley   ZOX:[id number] DOB: 1934-03-28  DOA: 06/12/2017 PCP: Assunta Found, MD   Brief Narrative:  Patient is a 83 year old male with hypothyroidism, hypertension, BPH, diabetes presented due to confusion, acute encephalopathy. Family had called EMS because patient did not answer the door. Per EMS patient was sitting in the chair, soiled in urine and stool. Patient had Hospital band on patient's arm dated December 2017 and med bottles were dated the same date and were empty. Patient reported that he had not been changed in 2 weeks. Upon ED evaluation CT of the head was done which showed acute CVA. Patient was admitted and neurology was consulted for further workup. During hospital stay patient developed atrial fibrillation for which was recommended to start patient on oral anticoagulation, although subsequently patient had another acute stroke which ended up making the patient unresponsive and comatose. Palliative care team has been involved trying to discuss goals of care with family.   Subjective: Afebrile, no CP, no SOB. Remains unresponsive. Plan si for PEG tube placement tomorrow and then discharge to SNF with palliative follow up.   Assessment & Plan: Acute CVA (cerebral vascular accident) (HCC) With acute metabolic encephalopathy - clinically remains unchanged.  - CT head showed recent appearing infarct in the left medial occipital lobe, early acute infarct in the left parietal lobe. - MRI showed a left PCA territory acute infarct with associated diffuse petechial hemorrhage throughout the infarct site. Occlusion of the left posterior cerebral artery at the mid P2 segment. Scattered subcentimeter foci of acute ischemia within both cerebral hemispheres, left thalamus and left anterior temporal lobe, parietal lobe and both frontal lobes suggesting central cardioembolic process. Patient EKG/telemetry found paroxysmal A. Fib. - 2-D echo showed EF  35-40%, moderate diffuse hypokinesis, intraventricular conduction delay  - Carotid Doppler showed no significant ICA stenosis - LDL 62, when able to take PO's will resume statins  - EEG showed moderate diffuse slowing of electro cerebral activity, no spikes or paroxysmal activity, no epileptiform activity on this recording. But a high risk for convulsions. Will add keppra once route for meds available. - Per neurology, plan was for 2 weeks of full dose ASA; and after that initiation of anticoagulation. He then, on 8/3 noted to be having weakness more in the left upper arm, CT head was repeated wich showed acute hemorrhagic infarct in the left occipital lobe, new areas of acute infarction in the cerebellum bilaterally and in the right occipital lobe.  -now no ASA and no AC. -at this point neurology do note recommend TEE; patient can not have anticoagulation and per cardiology not a candidate for any intervention.  -patient continues with very poor prognosis, given now he is not responsive, nutrition is an issue, palliative care has been consulted to discuss goals of care with family. After meeting, plan is for PEG tube, SNF for further care and rehabilitation chance; if patient fail to improve, then transition to hospice from there.  -appreciated palliative care service assistance and rec's  Paroxysmal A. Fib -patient with CHADsVASC score of 7 -plan was for anticoagulation therapy, but now had hemorrhagic stroke -will continue rate control only for now  Mildly elevated troponins - Cardiology was consulted deemed 2/2 demand ischemia and no further workup recommended  - 2-D echo showed EF of 35-40% with moderate diffuse hypokinesis -see below; will focus on conservative management only   Acute Systolic CHF - 2-D echo showed EF of 35-40%, cardiology consulted - Per  cardiology poor candidate for any invasive cardiac procedures -will focus on medical management only; will add low dose b-blocker when  route for meds available. -no ACE given AKI -daily weight and strict intake and output   MRSA  UTI -Urine culture showed more than 100,000 colonies of staph aureus -patient completed appropriate antibiotic therapy   Essential hypertension -stable BP -will monitor and adjust antihypertensive regimen as needed -currently BP stable w/o meds.    BPH -will continue flomax  Diabetes mellitus type 2  -Hemoglobin A1c 8.0  -CBG's 150-160; mainly from feeding tube -will continue insulin coverage   Hypothyroidism -TSH 6.2 -will continue synthroid  -repeat thyroid function level in 4 weeks   Moderate malnutrition -appreciate nutrition consult -plan is to place PEG -will follow feeding recommendation once route establish   Acute kidney injury -  -most likely pre-renal due to poor intake -will give some IVF's and follow trend  -once peg tube in place will add free water TID  Thrush -improvement/resolved -will continue oral care -completed treatment with diflucan   Chronic normocytic anemia -stable and w/o signs of acute bleeding -will monitor trend intermittently   DVT prophylaxis: SCD's  Code Status: DNR Family Communication: daughter at bedside  Disposition Plan: remains inpatient for now. Plan is for PEG tomorrow and then SNF when bed available,  Consultants:   Neurology   Palliative care   IR  Procedures:   MRI/MRA brain   2D ECHO   Antimicrobials: Anti-infectives    Start     Dose/Rate Route Frequency Ordered Stop   07/12/17 0600  vancomycin (VANCOCIN) IVPB 750 mg/150 ml premix  Status:  Discontinued     750 mg 150 mL/hr over 60 Minutes Intravenous Every 24 hours 07/11/17 1244 07/14/17 1732   07/10/17 0600  vancomycin (VANCOCIN) IVPB 1000 mg/200 mL premix  Status:  Discontinued     1,000 mg 200 mL/hr over 60 Minutes Intravenous Every 24 hours 07/09/17 2332 07/11/17 1244   07/06/17 2200  vancomycin (VANCOCIN) 1,250 mg in sodium chloride 0.9 % 250  mL IVPB  Status:  Discontinued     1,250 mg 166.7 mL/hr over 90 Minutes Intravenous Every 24 hours 07/06/17 1020 07/09/17 2329   07/06/17 1000  vancomycin (VANCOCIN) IVPB 1000 mg/200 mL premix  Status:  Discontinued     1,000 mg 200 mL/hr over 60 Minutes Intravenous Every 12 hours 06/30/2017 2138 07/06/17 1017   07/06/17 1000  fluconazole (DIFLUCAN) tablet 100 mg     100 mg Oral Daily 06/30/2017 2319 07/09/17 0959   06/12/2017 2200  vancomycin (VANCOCIN) 1,500 mg in sodium chloride 0.9 % 500 mL IVPB  Status:  Discontinued     1,500 mg 250 mL/hr over 120 Minutes Intravenous  Once 07/04/2017 2135 07/06/17 1017   07/02/2017 2145  cefTRIAXone (ROCEPHIN) 1 g in dextrose 5 % 50 mL IVPB  Status:  Discontinued     1 g 100 mL/hr over 30 Minutes Intravenous Every 24 hours 07/06/2017 2144 07/09/17 1224       Objective: Vitals:   07/17/17 1021 07/17/17 1412 07/17/17 1958 07/18/17 0442  BP: 126/74 (!) 150/81 134/74 132/72  Pulse: 62 87 66 96  Resp: 20 20 (!) 21 20  Temp: 99.1 F (37.3 C) 99.3 F (37.4 C) 99.1 F (37.3 C) 99 F (37.2 C)  TempSrc: Oral Oral Axillary Axillary  SpO2: 93% 98% 95% 97%  Weight:    98.2 kg (216 lb 7.9 oz)  Height:  Intake/Output Summary (Last 24 hours) at 07/18/17 1905 Last data filed at 07/18/17 1038  Gross per 24 hour  Intake           830.92 ml  Output             1275 ml  Net          -444.08 ml   Filed Weights   07/14/17 2330 07/16/17 0615 07/18/17 0442  Weight: 97.1 kg (214 lb) 94.2 kg (207 lb 10.8 oz) 98.2 kg (216 lb 7.9 oz)   General: afebrile, unable to follow commands and/or perform any purposeful movement or activities.  Cardiovascular: rate controlled, no rubs, no gallops, S1 and S2 appreciated.  Respiratory: CTA bilaterally Abdominal: soft, NT, ND, positive BS Neuro: able to open eyes spontaneously, but not tracking, non verbal, not following commands and not responding to tactile or painful stimuli. Patient is tetraplegic. Extremities: trace  edema bilaterally.   Data Reviewed: I have personally reviewed following labs and imaging studies  CBC:  Recent Labs Lab 07/14/17 0405 07/15/17 0533  WBC 11.8* 10.1  HGB 9.3* 9.0*  HCT 29.2* 27.6*  MCV 85.9 86.8  PLT 91* 92*   Basic Metabolic Panel:  Recent Labs Lab 07/12/17 0236 07/13/17 0305 07/14/17 0405 07/15/17 0533 07/16/17 0238  NA 137 135 138 142 138  K 3.5 3.4* 3.8 4.0 4.2  CL 105 103 104 105 105  CO2 26 26 28 29 27   GLUCOSE 160* 166* 158* 161* 161*  BUN 25* 28* 34* 43* 48*  CREATININE 1.86* 1.90* 1.91* 2.04* 2.05*  CALCIUM 7.5* 7.4* 7.8* 7.5* 7.6*   GFR: Estimated Creatinine Clearance: 34.7 mL/min (A) (by C-G formula based on SCr of 2.05 mg/dL (H)).  Coagulation Profile:  Recent Labs Lab 07/12/17 0236 07/13/17 0305 07/14/17 0405  INR 1.69 1.79 1.62   CBG:  Recent Labs Lab 07/18/17 0441 07/18/17 0757 07/18/17 1013 07/18/17 1252 07/18/17 1637  GLUCAP 151* 141* 161* 157* 167*    Radiology Studies: No results found.    Scheduled Meds: . citalopram  20 mg Oral Daily  . collagenase   Topical Daily  . insulin aspart  0-9 Units Subcutaneous Q4H  . latanoprost  1 drop Both Eyes QHS  . levothyroxine  200 mcg Per NG tube QAC breakfast  . mouth rinse  15 mL Mouth Rinse BID  . senna  1 tablet Oral Daily  . silver sulfADIAZINE   Topical Daily  . tamsulosin  0.4 mg Oral QPC supper   Continuous Infusions: . feeding supplement (GLUCERNA 1.2 CAL) 1,000 mL (07/18/17 1012)     LOS: 13 days   Time spent: Total of 25 minutes spent examining patient and over 50% of time discussing and updating family at bedside.   Vassie Loll, MD Pager: Text Page via [email or web address]  [phone]  If 7PM-7AM, please contact night-coverage [email or web address] Password [facility] 07/18/2017, 7:05 PM

## 2017-07-19 ENCOUNTER — Other Ambulatory Visit: Payer: Self-pay

## 2017-07-19 DIAGNOSIS — L89150 Pressure ulcer of sacral region, unstageable: Secondary | ICD-10-CM

## 2017-07-19 DIAGNOSIS — I480 Paroxysmal atrial fibrillation: Secondary | ICD-10-CM

## 2017-07-19 DIAGNOSIS — E875 Hyperkalemia: Secondary | ICD-10-CM

## 2017-07-19 LAB — BASIC METABOLIC PANEL
ANION GAP: 8 (ref 5–15)
Anion gap: 9 (ref 5–15)
BUN: 72 mg/dL — AB (ref 6–20)
BUN: 77 mg/dL — ABNORMAL HIGH (ref 6–20)
CALCIUM: 7.4 mg/dL — AB (ref 8.9–10.3)
CHLORIDE: 107 mmol/L (ref 101–111)
CO2: 27 mmol/L (ref 22–32)
CO2: 28 mmol/L (ref 22–32)
CREATININE: 2.41 mg/dL — AB (ref 0.61–1.24)
CREATININE: 2.55 mg/dL — AB (ref 0.61–1.24)
Calcium: 7.6 mg/dL — ABNORMAL LOW (ref 8.9–10.3)
Chloride: 110 mmol/L (ref 101–111)
GFR calc Af Amer: 27 mL/min — ABNORMAL LOW (ref >60)
GFR calc Af Amer: 26 mL/min — ABNORMAL LOW (ref >60)
GFR calc non Af Amer: 24 mL/min — ABNORMAL LOW (ref >60)
GFR calc non Af Amer: 22 mL/min — ABNORMAL LOW (ref >60)
GLUCOSE: 123 mg/dL — AB (ref 65–99)
GLUCOSE: 101 mg/dL — AB (ref 65–99)
Potassium: 6.7 mmol/L (ref 3.5–5.1)
Potassium: 6.1 mmol/L — ABNORMAL HIGH (ref 3.5–5.1)
Sodium: 143 mmol/L (ref 135–145)
Sodium: 146 mmol/L — ABNORMAL HIGH (ref 135–145)

## 2017-07-19 LAB — CBC
HCT: 28.7 % — ABNORMAL LOW (ref 39.0–52.0)
Hemoglobin: 9.3 g/dL — ABNORMAL LOW (ref 13.0–17.0)
MCH: 28.5 pg (ref 26.0–34.0)
MCHC: 32.4 g/dL (ref 30.0–36.0)
MCV: 88 fL (ref 78.0–100.0)
PLATELETS: 100 10*3/uL — AB (ref 150–400)
RBC: 3.26 MIL/uL — ABNORMAL LOW (ref 4.22–5.81)
RDW: 15.5 % (ref 11.5–15.5)
WBC: 14 10*3/uL — ABNORMAL HIGH (ref 4.0–10.5)

## 2017-07-19 LAB — GLUCOSE, CAPILLARY
GLUCOSE-CAPILLARY: 121 mg/dL — AB (ref 65–99)
GLUCOSE-CAPILLARY: 165 mg/dL — AB (ref 65–99)
Glucose-Capillary: 113 mg/dL — ABNORMAL HIGH (ref 65–99)
Glucose-Capillary: 118 mg/dL — ABNORMAL HIGH (ref 65–99)
Glucose-Capillary: 79 mg/dL (ref 65–99)

## 2017-07-19 LAB — PROTIME-INR
INR: 1.45
PROTHROMBIN TIME: 17.8 s — AB (ref 11.4–15.2)

## 2017-07-19 LAB — APTT: aPTT: 28 seconds (ref 24–36)

## 2017-07-19 LAB — NA AND K (SODIUM & POTASSIUM), RAND UR: Potassium Urine: 57 mmol/L

## 2017-07-19 MED ORDER — INSULIN ASPART 100 UNIT/ML ~~LOC~~ SOLN
10.0000 [IU] | Freq: Once | SUBCUTANEOUS | Status: AC
  Administered 2017-07-19: 10 [IU] via SUBCUTANEOUS

## 2017-07-19 MED ORDER — SODIUM POLYSTYRENE SULFONATE 15 GM/60ML PO SUSP
15.0000 g | Freq: Once | ORAL | Status: AC
  Administered 2017-07-19: 15 g via ORAL
  Filled 2017-07-19: qty 60

## 2017-07-19 MED ORDER — DEXTROSE 50 % IV SOLN
1.0000 | Freq: Once | INTRAVENOUS | Status: AC
  Administered 2017-07-19: 50 mL via INTRAVENOUS
  Filled 2017-07-19: qty 50

## 2017-07-19 MED ORDER — SODIUM CHLORIDE 0.9 % IV BOLUS (SEPSIS)
500.0000 mL | Freq: Once | INTRAVENOUS | Status: AC
  Administered 2017-07-19: 500 mL via INTRAVENOUS

## 2017-07-19 NOTE — Progress Notes (Signed)
CRITICAL VALUE ALERT  Critical Value:  Potassium 6.7  Date & Time Notied:  07/19/17 at 650am  Provider Notified: Dr. Maryfrances Bunnellanford paged at 652am  Orders Received/Actions taken:

## 2017-07-19 NOTE — Progress Notes (Signed)
Unable to move forward with g-tube as consent if still pending, but more urgently, his K is 6.7.  It is unsafe to proceed with sedation with a K this elevated as it puts his at risk for cardiac complications.  This will need to be addressed and then we can plan to try to proceed tomorrow if able and resolved.  Erling Arrazola E 7:33 AM 07/19/2017

## 2017-07-19 NOTE — Progress Notes (Signed)
PROGRESS NOTE [facility]   GUNNER IODICE   ZOX:[id number] DOB: [birth year]  DOA: 06/20/2017 PCP: Assunta Found, MD   Brief Narrative:  Patient is a 83 year old male with hypothyroidism, hypertension, BPH, diabetes presented due to confusion, acute encephalopathy. Family had called EMS because patient did not answer the door. Per EMS patient was sitting in the chair, soiled in urine and stool. Patient had Hospital band on patient's arm dated December 2017 and med bottles were dated the same date and were empty. Patient reported that he had not been changed in 2 weeks. Upon ED evaluation CT of the head was done which showed acute CVA. Patient was admitted and neurology was consulted for further workup. During hospital stay patient developed atrial fibrillation for which was recommended to start patient on oral anticoagulation, although subsequently patient had another acute stroke which ended up making the patient unresponsive and comatose. Palliative care team has been involved trying to discuss goals of care with family.   Subjective: Afebrile, no CP, no SOB. Remains unresponsive. Plan si for PEG tube placement tomorrow and then discharge to SNF with palliative follow up.   Assessment & Plan: Acute CVA (cerebral vascular accident) (HCC) With acute metabolic encephalopathy - clinically remains unchanged.  - CT head showed recent appearing infarct in the left medial occipital lobe, early acute infarct in the left parietal lobe. - MRI showed a left PCA territory acute infarct with associated diffuse petechial hemorrhage throughout the infarct site. Occlusion of the left posterior cerebral artery at the mid P2 segment. Scattered subcentimeter foci of acute ischemia within both cerebral hemispheres, left thalamus and left anterior temporal lobe, parietal lobe and both frontal lobes suggesting central cardioembolic process. Patient EKG/telemetry found paroxysmal A. Fib. - 2-D echo showed EF  35-40%, moderate diffuse hypokinesis, intraventricular conduction delay  - Carotid Doppler showed no significant ICA stenosis - LDL 62, when able to take PO's will resume statins  - EEG showed moderate diffuse slowing of electro cerebral activity, no spikes or paroxysmal activity, no epileptiform activity on this recording. But a high risk for convulsions. Will add keppra once route for meds available. - Per neurology, plan was for 2 weeks of full dose ASA; and after that initiation of anticoagulation. He then, on 8/3 noted to be having weakness more in the left upper arm, CT head was repeated wich showed acute hemorrhagic infarct in the left occipital lobe, new areas of acute infarction in the cerebellum bilaterally and in the right occipital lobe.  -now no ASA and no AC. -at this point neurology do note recommend TEE; patient can not have anticoagulation and per cardiology not a candidate for any intervention.  -patient continues with very poor prognosis, given now he is not responsive, nutrition is an issue, palliative care has been consulted to discuss goals of care with family. After meeting, plan is for PEG tube, SNF for further care and rehabilitation chance; if patient fail to improve, then transition to hospice from there.  -appreciated palliative care service assistance and rec's  Paroxysmal A. Fib -patient with CHADsVASC score of 7 -plan was for anticoagulation therapy, but now had hemorrhagic stroke and not a candidate for anticoagulation  -will continue rate control only for now  Mildly elevated troponins - Cardiology was consulted deemed 2/2 demand ischemia and no further workup recommended  - 2-D echo showed EF of 35-40% with moderate diffuse hypokinesis -see below; will focus on conservative management only   Acute Systolic CHF - 2-D echo showed  EF of 35-40%, cardiology consulted - Per cardiology poor candidate for any invasive cardiac procedures -will focus on medical  management only; will add low dose b-blocker when route for meds establish  -no ACE given AKI -daily weight and strict intake and output   MRSA  UTI -Urine culture showed more than 100,000 colonies of staph aureus -patient completed appropriate antibiotic therapy  -will monitor   Essential hypertension -stable BP -will monitor and adjust antihypertensive regimen as needed; using PRN hydralazine only -currently BP stable w/o meds.    BPH -continue flomax  Diabetes mellitus type 2  -Hemoglobin A1c 8.0  -CBG's 150-160; mainly from feeding tube -will continue insulin coverage and adjust hypoglycemic regimen as needed   Hypothyroidism -TSH was 6.2 -will continue synthroid   -repeat thyroid function level in 4 weeks   Moderate malnutrition -appreciate nutrition consult -plan is to place PEG -will follow feeding recommendation once route establish -currently receiving feeding through NG tube.   Acute kidney injury - on CKD: had hx of stage 3 -most likely pre-renal due to poor intake -will provide gentle IVF's and follow trend  -once peg tube in place will add free water TID  Thrush -improvement/resolved -continue oral care -dry MM appreciated -has completed therapy with diflucan  Chronic normocytic anemia -no signs of acute bleeding -will monitor Hgb trend  Hyperkalemia -Most likely from worsening renal function -treated with albuterol, insulin, kayexalate) -no abnormalities on telemetry   Sacrum pressure injury -no present on admission -appears to be evolution from deep tissue injury -unstageable -will follow WOC rec's -continue preventive measurement   Low grade fever -potentially due to autonomic dysfunction -will monitor for superimposed infection Repeat UA  DVT prophylaxis: SCD's  Code Status: DNR Family Communication: daughter at bedside  Disposition Plan: remains inpatient for now. Plan is for PEG tube placement and then SNF.  Consultants:    Neurology   Palliative care   IR  Procedures:   MRI/MRA brain   2D ECHO   Antimicrobials: Anti-infectives    Start     Dose/Rate Route Frequency Ordered Stop   07/12/17 0600  vancomycin (VANCOCIN) IVPB 750 mg/150 ml premix  Status:  Discontinued     750 mg 150 mL/hr over 60 Minutes Intravenous Every 24 hours 07/11/17 1244 07/14/17 1732   07/10/17 0600  vancomycin (VANCOCIN) IVPB 1000 mg/200 mL premix  Status:  Discontinued     1,000 mg 200 mL/hr over 60 Minutes Intravenous Every 24 hours 07/09/17 2332 07/11/17 1244   07/06/17 2200  vancomycin (VANCOCIN) 1,250 mg in sodium chloride 0.9 % 250 mL IVPB  Status:  Discontinued     1,250 mg 166.7 mL/hr over 90 Minutes Intravenous Every 24 hours 07/06/17 1020 07/09/17 2329   07/06/17 1000  vancomycin (VANCOCIN) IVPB 1000 mg/200 mL premix  Status:  Discontinued     1,000 mg 200 mL/hr over 60 Minutes Intravenous Every 12 hours 06/16/2017 2138 07/06/17 1017   07/06/17 1000  fluconazole (DIFLUCAN) tablet 100 mg     100 mg Oral Daily 06/17/2017 2319 07/09/17 0959   06/23/2017 2200  vancomycin (VANCOCIN) 1,500 mg in sodium chloride 0.9 % 500 mL IVPB  Status:  Discontinued     1,500 mg 250 mL/hr over 120 Minutes Intravenous  Once 06/28/2017 2135 07/06/17 1017   06/22/2017 2145  cefTRIAXone (ROCEPHIN) 1 g in dextrose 5 % 50 mL IVPB  Status:  Discontinued     1 g 100 mL/hr over 30 Minutes Intravenous Every 24 hours 06/21/2017  2144 07/09/17 1224       Objective: Vitals:   07/17/17 1958 07/18/17 0442 07/18/17 2013 07/19/17 0422  BP: 134/74 132/72 (!) 152/84 (!) 155/72  Pulse: 66 96 (!) 105 (!) 101  Resp: (!) 21 20 20  (!) 25  Temp: 99.1 F (37.3 C) 99 F (37.2 C) (!) 101.1 F (38.4 C) 100.1 F (37.8 C)  TempSrc: Axillary Axillary Axillary Axillary  SpO2: 95% 97% 92% 91%  Weight:  98.2 kg (216 lb 7.9 oz)  98.1 kg (216 lb 5.6 oz)  Height:        Intake/Output Summary (Last 24 hours) at 07/19/17 1159 Last data filed at 07/19/17 0725  Gross  per 24 hour  Intake          1530.08 ml  Output             1125 ml  Net           405.08 ml   Filed Weights   07/16/17 0615 07/18/17 0442 07/19/17 0422  Weight: 94.2 kg (207 lb 10.8 oz) 98.2 kg (216 lb 7.9 oz) 98.1 kg (216 lb 5.6 oz)   General: patient spike low grade temp; remains non verbal and is unable to follow commands or performed any purposeful movement or activities. Cardiovascular: slight tachycardia, no rubs, no gallops Respiratory: no wheezing, no crackles, good air movement  Abdominal: soft, NT, ND, positive BS Neuro: intermittently open eyes, not tracking, non verbal and not responding to painful stimuli. Patient is tetraplegic.  Skin: some onychomycosis and chronic stasis dermatitis on his legs appreciated. Also with sacrum unstageable pressure injury with black eschar. No present on admission. Appears to be evolution from deep tissue injury.  Extremities: trace edema on Le and upper extremities bilaterally.  Data Reviewed: I have personally reviewed following labs and imaging studies  CBC:  Recent Labs Lab 07/14/17 0405 07/15/17 0533 07/19/17 0539  WBC 11.8* 10.1 14.0*  HGB 9.3* 9.0* 9.3*  HCT 29.2* 27.6* 28.7*  MCV 85.9 86.8 88.0  PLT 91* 92* 100*   Basic Metabolic Panel:  Recent Labs Lab 07/13/17 0305 07/14/17 0405 07/15/17 0533 07/16/17 0238 07/19/17 0539  NA 135 138 142 138 143  K 3.4* 3.8 4.0 4.2 6.7*  CL 103 104 105 105 107  CO2 26 28 29 27 27   GLUCOSE 166* 158* 161* 161* 123*  BUN 28* 34* 43* 48* 72*  CREATININE 1.90* 1.91* 2.04* 2.05* 2.41*  CALCIUM 7.4* 7.8* 7.5* 7.6* 7.6*   GFR: Estimated Creatinine Clearance: 29.5 mL/min (A) (by C-G formula based on SCr of 2.41 mg/dL (H)).  Coagulation Profile:  Recent Labs Lab 07/13/17 0305 07/14/17 0405 07/19/17 0352  INR 1.79 1.62 1.45   CBG:  Recent Labs Lab 07/18/17 1637 07/18/17 2010 07/18/17 2329 07/19/17 0417 07/19/17 0822  GLUCAP 167* 202* 169* 113* 118*    Radiology  Studies: No results found.    Scheduled Meds: . citalopram  20 mg Oral Daily  . collagenase   Topical Daily  . insulin aspart  0-9 Units Subcutaneous Q4H  . latanoprost  1 drop Both Eyes QHS  . levothyroxine  200 mcg Per NG tube QAC breakfast  . mouth rinse  15 mL Mouth Rinse BID  . senna  1 tablet Oral Daily  . silver sulfADIAZINE   Topical Daily  . tamsulosin  0.4 mg Oral QPC supper   Continuous Infusions: . feeding supplement (GLUCERNA 1.2 CAL) 1,000 mL (07/19/17 0903)     LOS: 14 days  Time spent: Total of 25 minutes spent examining patient and over 50% of time discussing and updating family at bedside.   Vassie Loll, MD Pager: Text Page via [email or web address]  [phone]  If 7PM-7AM, please contact night-coverage [email or web address] Password [email or web address] 07/19/2017, 11:59 AM

## 2017-07-19 NOTE — Progress Notes (Signed)
Palliative Medicine RN Note: Rec'd a call from daughter Maxine GlennMonica [phone]). She did not have a palliative question/concern but was trying to find "Morrie Sheldonshley" who was going to send her a "list of facilities." I called SW for 6N; she will [facility] to answer her questions.  Margret ChanceMelanie G. Lynton Crescenzo, RN, BSN, [facility] 07/19/2017 1:50 PM Cell [phone] 8:00-4:00 Monday-Friday Office [phone]

## 2017-07-20 ENCOUNTER — Encounter (HOSPITAL_COMMUNITY): Payer: Self-pay | Admitting: Interventional Radiology

## 2017-07-20 ENCOUNTER — Inpatient Hospital Stay (HOSPITAL_COMMUNITY): Payer: Medicare Other

## 2017-07-20 HISTORY — PX: IR GASTROSTOMY TUBE MOD SED: IMG625

## 2017-07-20 LAB — BASIC METABOLIC PANEL
ANION GAP: 11 (ref 5–15)
BUN: 86 mg/dL — ABNORMAL HIGH (ref 6–20)
CO2: 26 mmol/L (ref 22–32)
Calcium: 7.3 mg/dL — ABNORMAL LOW (ref 8.9–10.3)
Chloride: 112 mmol/L — ABNORMAL HIGH (ref 101–111)
Creatinine, Ser: 2.64 mg/dL — ABNORMAL HIGH (ref 0.61–1.24)
GFR, EST AFRICAN AMERICAN: 25 mL/min — AB (ref >60)
GFR, EST NON AFRICAN AMERICAN: 21 mL/min — AB (ref >60)
Glucose, Bld: 110 mg/dL — ABNORMAL HIGH (ref 65–99)
Potassium: 5.1 mmol/L (ref 3.5–5.1)
SODIUM: 149 mmol/L — AB (ref 135–145)

## 2017-07-20 LAB — URINALYSIS, ROUTINE W REFLEX MICROSCOPIC
BILIRUBIN URINE: NEGATIVE
GLUCOSE, UA: NEGATIVE mg/dL
Ketones, ur: NEGATIVE mg/dL
NITRITE: NEGATIVE
PH: 6 (ref 5.0–8.0)
Protein, ur: 30 mg/dL — AB
SPECIFIC GRAVITY, URINE: 1.014 (ref 1.005–1.030)

## 2017-07-20 LAB — GLUCOSE, CAPILLARY
GLUCOSE-CAPILLARY: 128 mg/dL — AB (ref 65–99)
GLUCOSE-CAPILLARY: 116 mg/dL — AB (ref 65–99)
GLUCOSE-CAPILLARY: 134 mg/dL — AB (ref 65–99)
Glucose-Capillary: 100 mg/dL — ABNORMAL HIGH (ref 65–99)
Glucose-Capillary: 103 mg/dL — ABNORMAL HIGH (ref 65–99)
Glucose-Capillary: 111 mg/dL — ABNORMAL HIGH (ref 65–99)
Glucose-Capillary: 94 mg/dL (ref 65–99)

## 2017-07-20 MED ORDER — LIDOCAINE HCL 1 % IJ SOLN
INTRAMUSCULAR | Status: AC | PRN
  Administered 2017-07-20: 5 mL

## 2017-07-20 MED ORDER — GLUCAGON HCL (RDNA) 1 MG IJ SOLR
INTRAMUSCULAR | Status: AC | PRN
  Administered 2017-07-20: 1 mg via INTRAVENOUS

## 2017-07-20 MED ORDER — IOPAMIDOL (ISOVUE-300) INJECTION 61%
INTRAVENOUS | Status: AC
  Administered 2017-07-20: 10 mL
  Filled 2017-07-20: qty 50

## 2017-07-20 MED ORDER — SODIUM CHLORIDE 0.45 % IV SOLN
INTRAVENOUS | Status: DC
  Administered 2017-07-20 – 2017-07-22 (×2): via INTRAVENOUS

## 2017-07-20 MED ORDER — LEVETIRACETAM 100 MG/ML PO SOLN
500.0000 mg | Freq: Two times a day (BID) | ORAL | Status: DC
  Administered 2017-07-20 – 2017-07-25 (×12): 500 mg
  Filled 2017-07-20 (×14): qty 5

## 2017-07-20 MED ORDER — FENTANYL CITRATE (PF) 100 MCG/2ML IJ SOLN
INTRAMUSCULAR | Status: AC
  Filled 2017-07-20: qty 2

## 2017-07-20 MED ORDER — GLUCAGON HCL RDNA (DIAGNOSTIC) 1 MG IJ SOLR
INTRAMUSCULAR | Status: AC
  Filled 2017-07-20: qty 1

## 2017-07-20 MED ORDER — FENTANYL CITRATE (PF) 100 MCG/2ML IJ SOLN
INTRAMUSCULAR | Status: AC | PRN
  Administered 2017-07-20: 50 ug via INTRAVENOUS

## 2017-07-20 MED ORDER — LIDOCAINE HCL (PF) 1 % IJ SOLN
INTRAMUSCULAR | Status: AC
  Filled 2017-07-20: qty 30

## 2017-07-20 MED ORDER — METOPROLOL TARTRATE 25 MG/10 ML ORAL SUSPENSION
25.0000 mg | Freq: Two times a day (BID) | ORAL | Status: DC
  Administered 2017-07-20 – 2017-07-25 (×12): 25 mg
  Filled 2017-07-20 (×14): qty 10

## 2017-07-20 NOTE — Progress Notes (Signed)
PROGRESS NOTE [facility]   Garrett Bradley   WUJ:[id number] DOB: [birth year]  DOA: 08-01-17 PCP: Assunta Found, MD   Brief Narrative:  Patient is a 83 year old male with hypothyroidism, hypertension, BPH, diabetes presented due to confusion, acute encephalopathy. Family had called EMS because patient did not answer the door. Per EMS patient was sitting in the chair, soiled in urine and stool. Patient had Hospital band on patient's arm dated December 2017 and med bottles were dated the same date and were empty. Patient reported that he had not been changed in 2 weeks. Upon ED evaluation CT of the head was done which showed acute CVA. Patient was admitted and neurology was consulted for further workup. During hospital stay patient developed atrial fibrillation for which was recommended to start patient on oral anticoagulation, although subsequently patient had another acute stroke which ended up making the patient unresponsive and comatose. Palliative care team has been involved trying to discuss goals of care with family.   Subjective: Afebrile, non verbal, unable to follow commands. Will have PEG todaya nd then plan is for SNF. No acute distress or changes.   Assessment & Plan: Acute CVA (cerebral vascular accident) (HCC) With acute metabolic encephalopathy - clinically remains unchanged.  - CT head showed recent appearing infarct in the left medial occipital lobe, early acute infarct in the left parietal lobe. - MRI showed a left PCA territory acute infarct with associated diffuse petechial hemorrhage throughout the infarct site. Occlusion of the left posterior cerebral artery at the mid P2 segment. Scattered subcentimeter foci of acute ischemia within both cerebral hemispheres, left thalamus and left anterior temporal lobe, parietal lobe and both frontal lobes suggesting central cardioembolic process. Patient EKG/telemetry found paroxysmal A. Fib. - 2-D echo showed EF 35-40%, moderate  diffuse hypokinesis, intraventricular conduction delay  - Carotid Doppler showed no significant ICA stenosis - LDL 62, when able to take PO's will resume statins  - EEG showed moderate diffuse slowing of electro cerebral activity, no spikes or paroxysmal activity, no epileptiform activity on this recording. But a high risk for convulsions. After discussing with neurology will add keppra to his regimen.  - Per neurology, plan was for 2 weeks of full dose ASA; and after that initiation of anticoagulation. Patient then, on 8/3 noted to be having weakness more in the left upper arm, CT head was repeated wich showed acute hemorrhagic infarct in the left occipital lobe, new areas of acute infarction in the cerebellum bilaterally and in the right occipital lobe. Since then he has remained essentially tetraplegic and unresponsive. -now no ASA and no AC given large acute hemorrhagic event. -at this point neurology do note recommend TEE; patient can not have anticoagulation and per cardiology not a candidate for any intervention.  -patient continues with very poor prognosis, given now he is not responsive, nutrition is an issue, palliative care has been consulted to discuss goals of care with family. After meeting, plan is for PEG tube, SNF for further care and rehabilitation chance; if patient fail to improve, then transition to hospice from there.  -appreciated palliative care service assistance and rec's  Paroxysmal A. Fib -patient with CHADsVASC score of 7 -plan was for anticoagulation therapy, but now had hemorrhagic stroke and not a candidate for anticoagulation  -will continue rate control only for now -currently sinus rhythm   Mildly elevated troponins - Cardiology was consulted deemed 2/2 demand ischemia and no further workup recommended  - 2-D echo showed EF of 35-40% with moderate  diffuse hypokinesis -see below; will focus on conservative management only (will add b-blocker to his  regimen)  Acute Systolic CHF - 2-D echo showed EF of 35-40%, cardiology consulted - Per cardiology poor candidate for any invasive cardiac procedures -will focus on medical management only; will add low dose b-blocker to his management -no ACE given AKI -continue daily weight and strict intake and output   MRSA  UTI -Urine culture showed more than 100,000 colonies of staph aureus -patient completed appropriate antibiotic therapy  -repeat UA no suggesting infection   Essential hypertension -stable BP -will monitor and adjust antihypertensive regimen as needed; using PRN hydralazine only -currently BP stable w/o meds.    BPH -continue flomax -no signs of urinary retention   Diabetes mellitus type 2  -Hemoglobin A1c 8.0  -CBG's 150-160; mainly from feeding tube -will continue insulin coverage and adjust hypoglycemic regimen as needed   Hypothyroidism -TSH was 6.2 -will continue synthroid   -repeat thyroid function level in 4 weeks   Severe protein/calorie malnutrition -appreciate nutritional service consult -plan is to place PEG -will follow feeding recommendation once route establish -currently receiving feeding through NG tube currently.   Acute kidney injury on CKD: had hx of stage 3 and now with hypernatremia -most likely pre-renal due to poor intake -will provide gentle IVF's (using 1/2 NS) and follow renal function trend  -once peg tube in place will require addition of free water TID  Thrush -improvement/resolved -continue oral care -dry MM appreciated -has completed therapy with diflucan  Chronic normocytic anemia -no signs of acute bleeding -will continue monitoring Hgb trend  Hyperkalemia -Most likely from worsening renal function -condition treated with albuterol, insulin and kayexalate -no abnormalities on telemetry appreciated -repeat K today is 5.1 -will monitor trend and continue gentle IVF's  Sacrum pressure injury -no present on  admission -appears to be evolution from deep tissue injury -unstageable -[facility] and will follow rec's -continue preventive measurement   Low grade fever -potentially due to autonomic dysfunction -afebrile today -no signs of superimposed infection source  -UA w/o nitrite and just small amount of leukocyte  -will monitor VS  DVT prophylaxis: SCD's  Code Status: DNR Family Communication: daughter at bedside  Disposition Plan: still waiting for PEG placement and subsequent discharge to SNF. Patient with overall poor prognosis and will need palliative care follow up at SNF. if he fail to improve will require transition to hospice and full comfort care.  Consultants:   Neurology   Palliative care   IR  Procedures:   MRI/MRA brain   2D ECHO   Antimicrobials: Anti-infectives    Start     Dose/Rate Route Frequency Ordered Stop   07/12/17 0600  vancomycin (VANCOCIN) IVPB 750 mg/150 ml premix  Status:  Discontinued     750 mg 150 mL/hr over 60 Minutes Intravenous Every 24 hours 07/11/17 1244 07/14/17 1732   07/10/17 0600  vancomycin (VANCOCIN) IVPB 1000 mg/200 mL premix  Status:  Discontinued     1,000 mg 200 mL/hr over 60 Minutes Intravenous Every 24 hours 07/09/17 2332 07/11/17 1244   07/06/17 2200  vancomycin (VANCOCIN) 1,250 mg in sodium chloride 0.9 % 250 mL IVPB  Status:  Discontinued     1,250 mg 166.7 mL/hr over 90 Minutes Intravenous Every 24 hours 07/06/17 1020 07/09/17 2329   07/06/17 1000  vancomycin (VANCOCIN) IVPB 1000 mg/200 mL premix  Status:  Discontinued     1,000 mg 200 mL/hr over 60 Minutes Intravenous  Every 12 hours 08-03-2017 2138 07/06/17 1017   07/06/17 1000  fluconazole (DIFLUCAN) tablet 100 mg     100 mg Oral Daily 2017/08/03 2319 07/09/17 0959   2017/08/03 2200  vancomycin (VANCOCIN) 1,500 mg in sodium chloride 0.9 % 500 mL IVPB  Status:  Discontinued     1,500 mg 250 mL/hr over 120 Minutes Intravenous  Once 03-Aug-2017 2135  07/06/17 1017   August 03, 2017 2145  cefTRIAXone (ROCEPHIN) 1 g in dextrose 5 % 50 mL IVPB  Status:  Discontinued     1 g 100 mL/hr over 30 Minutes Intravenous Every 24 hours Aug 03, 2017 2144 07/09/17 1224       Objective: Vitals:   07/19/17 0422 07/19/17 1459 07/19/17 2230 07/20/17 0412  BP: (!) 155/72 (!) 148/48 (!) 142/52 (!) 144/49  Pulse: (!) 101 (!) 103 100 99  Resp: (!) 25 20 20 18   Temp: 100.1 F (37.8 C) (!) 101.3 F (38.5 C) 100 F (37.8 C) 99.8 F (37.7 C)  TempSrc: Axillary Axillary Axillary   SpO2: 91% 96% 96% 92%  Weight: 98.1 kg (216 lb 5.6 oz)   97.9 kg (215 lb 13.3 oz)  Height:        Intake/Output Summary (Last 24 hours) at 07/20/17 1411 Last data filed at 07/20/17 0445  Gross per 24 hour  Intake          1031.75 ml  Output             1000 ml  Net            31.75 ml   Filed Weights   07/18/17 0442 07/19/17 0422 07/20/17 0412  Weight: 98.2 kg (216 lb 7.9 oz) 98.1 kg (216 lb 5.6 oz) 97.9 kg (215 lb 13.3 oz)   General: afebrile currently, remains non verbal, and unable to follow commands or demonstrate purposeful activities.  Cardiovascular: slight tachycardia, no rubs, no gallops Respiratory: no wheezing, no crackles, good air movement   Abdominal: soft, NT, ND, positive BS Neuro: neurologic exam unchanged from 07/19/17: intermittently open eyes, not tracking, non verbal and not responding to painful stimuli. Patient is tetraplegic.  Skin: unchanged exam on skin examination from 07/19/17: some onychomycosis and chronic stasis dermatitis on his legs appreciated. Also with sacrum unstageable pressure injury with black eschar. No present on admission. Appears to be evolution from deep tissue injury.  Extremities: trace edema on Le and upper extremities bilaterally.  Data Reviewed: I have personally reviewed following labs and imaging studies  CBC:  Recent Labs Lab 07/14/17 0405 07/15/17 0533 07/19/17 0539  WBC 11.8* 10.1 14.0*  HGB 9.3* 9.0* 9.3*  HCT 29.2*  27.6* 28.7*  MCV 85.9 86.8 88.0  PLT 91* 92* 100*   Basic Metabolic Panel:  Recent Labs Lab 07/15/17 0533 07/16/17 0238 07/19/17 0539 07/19/17 1434 07/20/17 0537  NA 142 138 143 146* 149*  K 4.0 4.2 6.7* 6.1* 5.1  CL 105 105 107 110 112*  CO2 29 27 27 28 26   GLUCOSE 161* 161* 123* 101* 110*  BUN 43* 48* 72* 77* 86*  CREATININE 2.04* 2.05* 2.41* 2.55* 2.64*  CALCIUM 7.5* 7.6* 7.6* 7.4* 7.3*   GFR: Estimated Creatinine Clearance: 26.9 mL/min (A) (by C-G formula based on SCr of 2.64 mg/dL (H)).  Coagulation Profile:  Recent Labs Lab 07/14/17 0405 07/19/17 0352  INR 1.62 1.45   CBG:  Recent Labs Lab 07/20/17 0018 07/20/17 0403 07/20/17 0411 07/20/17 0755 07/20/17 1143  GLUCAP 103* 100* 94 111* 128*  Radiology Studies: No results found.    Scheduled Meds: . citalopram  20 mg Oral Daily  . collagenase   Topical Daily  . insulin aspart  0-9 Units Subcutaneous Q4H  . latanoprost  1 drop Both Eyes QHS  . levothyroxine  200 mcg Per NG tube QAC breakfast  . mouth rinse  15 mL Mouth Rinse BID  . senna  1 tablet Oral Daily  . silver sulfADIAZINE   Topical Daily  . tamsulosin  0.4 mg Oral QPC supper   Continuous Infusions: . sodium chloride 50 mL/hr at 07/20/17 0829  . feeding supplement (GLUCERNA 1.2 CAL) Stopped (07/20/17 0000)     LOS: 15 days   Time spent: Total of 25 minutes spent examining patient and over 50% of time discussing and updating family at bedside.   Vassie Loll, MD Pager: Text Page via [email or web address]  [phone]  If 7PM-7AM, please contact night-coverage [email or web address] Password [facility] 07/20/2017, 2:11 PM

## 2017-07-20 NOTE — Clinical Social Work Note (Signed)
CSW spoke with dtr Mikey KirschnerMonica Deemer [phone]) about SNF placement. CSW discussed insurance coverage for long term care vs. short term rehab. CSW updated MD and left voicemail for palliative RN. CSW will continue to follow for discharge needs.   Corlis HoveJeneya Indalecio Malmstrom, LCSWA, LCASA Clinical Social Work [phone]

## 2017-07-20 NOTE — Progress Notes (Addendum)
1610 Pt to IR for PEG tube placement. [facility].  1800 Received pt from IR, PEG tube in place, dressing dry and intact. V/S stable.

## 2017-07-20 NOTE — Consult Note (Signed)
           [facility]  07/20/2017  [name] 06/01/1934 [id number]   Went to see patient at the bedside to identify possible discharge needs but RN reports that patient remains unresponsive.  Patient's chart reveals that he is 83 year old male with hypothyroidism, hypertension, BPH, DM who was admitted due to confusion and acute encephalopathy. Patient was found to have acute CVA, neurology was consulted for further workup. During hospital stay, patient had another acute stroke which ended up making him unresponsive and comatose.   Per MD note, patient continues with very poor prognosis, not responsive, nutrition is an issue, palliative care has been consulted to discuss goals of care with family. After meeting, plan is for insertion of PEG tube, skilled nursing facility placement for further care and rehabilitation chance; if patient fail to improve, then transition to hospice from there.   No health management neededat this time.   For questions, please contact:  Wyatt HasteLorraine Mckinna Demars, BSN, RN- [facility] Primary [facility] Navigator  Telephone: [phone] [facility]

## 2017-07-20 NOTE — Procedures (Signed)
Pre procedure Dx: Dysphagia Post Procedure Dx: Same  Successful fluoroscopic guided insertion of gastrostomy tube.   The gastrostomy tube may be used immediately for medications.   Tube feeds may be initiated in 24 hours as per the primary team.    EBL: None Complications: None immediate  Jay Cheng Dec, MD Pager #: [phone]    

## 2017-07-21 ENCOUNTER — Inpatient Hospital Stay (HOSPITAL_COMMUNITY): Payer: Medicare Other

## 2017-07-21 DIAGNOSIS — N179 Acute kidney failure, unspecified: Secondary | ICD-10-CM

## 2017-07-21 DIAGNOSIS — D696 Thrombocytopenia, unspecified: Secondary | ICD-10-CM

## 2017-07-21 DIAGNOSIS — E440 Moderate protein-calorie malnutrition: Secondary | ICD-10-CM

## 2017-07-21 DIAGNOSIS — J9601 Acute respiratory failure with hypoxia: Secondary | ICD-10-CM

## 2017-07-21 DIAGNOSIS — I248 Other forms of acute ischemic heart disease: Secondary | ICD-10-CM

## 2017-07-21 LAB — BASIC METABOLIC PANEL
ANION GAP: 10 (ref 5–15)
BUN: 98 mg/dL — ABNORMAL HIGH (ref 6–20)
CHLORIDE: 111 mmol/L (ref 101–111)
CO2: 28 mmol/L (ref 22–32)
Calcium: 7.2 mg/dL — ABNORMAL LOW (ref 8.9–10.3)
Creatinine, Ser: 2.79 mg/dL — ABNORMAL HIGH (ref 0.61–1.24)
GFR calc Af Amer: 23 mL/min — ABNORMAL LOW (ref >60)
GFR calc non Af Amer: 20 mL/min — ABNORMAL LOW (ref >60)
GLUCOSE: 149 mg/dL — AB (ref 65–99)
POTASSIUM: 4.3 mmol/L (ref 3.5–5.1)
Sodium: 149 mmol/L — ABNORMAL HIGH (ref 135–145)

## 2017-07-21 LAB — GLUCOSE, CAPILLARY
GLUCOSE-CAPILLARY: 133 mg/dL — AB (ref 65–99)
GLUCOSE-CAPILLARY: 91 mg/dL (ref 65–99)
Glucose-Capillary: 106 mg/dL — ABNORMAL HIGH (ref 65–99)
Glucose-Capillary: 125 mg/dL — ABNORMAL HIGH (ref 65–99)
Glucose-Capillary: 130 mg/dL — ABNORMAL HIGH (ref 65–99)
Glucose-Capillary: 81 mg/dL (ref 65–99)
Glucose-Capillary: 96 mg/dL (ref 65–99)

## 2017-07-21 MED ORDER — GLUCERNA 1.2 CAL PO LIQD
1000.0000 mL | ORAL | Status: DC
  Administered 2017-07-21: 1000 mL
  Filled 2017-07-21: qty 1000

## 2017-07-21 MED ORDER — LEVOTHYROXINE SODIUM 100 MCG PO TABS
200.0000 ug | ORAL_TABLET | Freq: Every day | ORAL | Status: DC
  Administered 2017-07-22 – 2017-07-25 (×4): 200 ug
  Filled 2017-07-21 (×4): qty 2

## 2017-07-21 MED ORDER — CITALOPRAM HYDROBROMIDE 20 MG PO TABS
20.0000 mg | ORAL_TABLET | Freq: Every day | ORAL | Status: DC
  Administered 2017-07-22 – 2017-07-25 (×4): 20 mg
  Filled 2017-07-21 (×4): qty 1

## 2017-07-21 MED ORDER — PRO-STAT SUGAR FREE PO LIQD
30.0000 mL | Freq: Two times a day (BID) | ORAL | Status: DC
  Administered 2017-07-22 – 2017-07-25 (×8): 30 mL
  Filled 2017-07-21 (×8): qty 30

## 2017-07-21 MED ORDER — SENNA 8.6 MG PO TABS
1.0000 | ORAL_TABLET | Freq: Every day | ORAL | Status: DC
  Administered 2017-07-22 – 2017-07-25 (×3): 8.6 mg
  Filled 2017-07-21 (×3): qty 1

## 2017-07-21 NOTE — Progress Notes (Signed)
Patient ID: Garrett Bradley, male   DOB: 04/09/1934, 83 y.o.   MRN: [id number]     Supervising Physician: Gilmer MorWagner, Jaime  [facility]  Chief Complaint: dysphagia  Subjective: Patient not arousable.  Allergies: Thiazide-type diuretics  Medications: Prior to Admission medications   Medication Sig Start Date End Date Taking? Authorizing Provider  ALPRAZolam Prudy Feeler(XANAX) 1 MG tablet Take 1 tablet (1 mg total) by mouth at bedtime as needed for sleep. 03/23/16   Standley BrookingGoodrich, Daniel P, MD  citalopram (CELEXA) 20 MG tablet Take 1 tablet (20 mg total) by mouth daily. Patient not taking: Reported on 07/06/2017 03/23/16   Standley BrookingGoodrich, Daniel P, MD  fish oil-omega-3 fatty acids 1000 MG capsule Take 1 capsule (1 g total) by mouth 2 (two) times daily. Patient not taking: Reported on 07/06/2017 10/02/11   Christiane HaSullivan, Corinna L, MD  furosemide (LASIX) 20 MG tablet TAKE 40 MG DAILY AS NEEDED FOR LEG SWELLING. Patient not taking: Reported on 07/06/2017 06/19/16   Erick BlinksMemon, Jehanzeb, MD  senna (SENOKOT) 8.6 MG TABS tablet Take 1 tablet (8.6 mg total) by mouth daily. Patient not taking: Reported on 07/06/2017 03/11/16   Lavera GuiseLiu, Dana Duo, MD    Vital Signs: BP 139/69 (BP Location: Left Arm)   Pulse 95   Temp 99.6 F (37.6 C) (Axillary)   Resp (!) 30   Ht 6\' 4"  (1.93 m)   Wt 215 lb 13.3 oz (97.9 kg)   SpO2 99%   BMI 26.27 kg/m   Physical Exam: Abd: soft, g-tube in position with no erythema or drainage.    Imaging: Ir Gastrostomy Tube Mod Sed  Result Date: 07/20/2017 INDICATION: Dysphagia. In need of gastrostomy tube placement for enteric nutrition supplementation. EXAM: PULL TROUGH GASTROSTOMY TUBE PLACEMENT COMPARISON:  CT abdomen pelvis - 03/11/2016 MEDICATIONS: The patient is currently admitted to the hospital receiving intravenous antibiotics; Antibiotics were administered within 1 hour of the procedure. Glucagon 1 mg IV CONTRAST:  30 mL of Isovue 300 administered into the gastric lumen.  ANESTHESIA/SEDATION: Moderate (conscious) sedation was employed during this procedure. A total of Fentanyl 50 mcg was administered intravenously. Moderate Sedation Time: 15 minutes. The patient's level of consciousness and vital signs were monitored continuously by radiology nursing throughout the procedure under my direct supervision. FLUOROSCOPY TIME:  6 minutes 12 seconds (45 mGy) COMPLICATIONS: None immediate. PROCEDURE: Informed written consent was obtained from the patient's family following explanation of the procedure, risks, benefits and alternatives. A time out was performed prior to the initiation of the procedure. Ultrasound scanning was performed to demarcate the edge of the left lobe of the liver. Maximal barrier sterile technique utilized including caps, mask, sterile gowns, sterile gloves, large sterile drape, hand hygiene and Betadine prep. The left upper quadrant was sterilely prepped and draped. An oral gastric catheter was inserted into the stomach under fluoroscopy. The existing nasogastric feeding tube was removed. The left costal margin and air opacified transverse colon were identified and avoided. Air was injected into the stomach for insufflation and visualization under fluoroscopy. Under sterile conditions a 17 gauge trocar needle was utilized to access the stomach percutaneously beneath the left subcostal margin after the overlying soft tissues were anesthetized with 1% Lidocaine with epinephrine. Needle position was confirmed within the stomach with aspiration of air and injection of small amount of contrast. A single T tack was deployed for gastropexy. Over an Amplatz guide wire, a 9-French sheath was inserted into the stomach. A snare device was utilized to capture the  oral gastric catheter. The snare device was pulled retrograde from the stomach up the esophagus and out the oropharynx. The 20-French pull-through gastrostomy was connected to the snare device and pulled antegrade  through the oropharynx down the esophagus into the stomach and then through the percutaneous tract external to the patient. The gastrostomy was assembled externally. Contrast injection confirms position in the stomach. Several spot radiographic images were obtained in various obliquities for documentation. The patient tolerated procedure well without immediate post procedural complication. FINDINGS: After successful fluoroscopic guided placement, the gastrostomy tube is appropriately positioned with internal disc against the ventral aspect of the gastric lumen. IMPRESSION: Successful fluoroscopic insertion of a 20-French pull-through gastrostomy tube. The gastrostomy may be used immediately for medication administration and in 24 hrs for the initiation of feeds. Electronically Signed   By: Simonne Come M.D.   On: 07/20/2017 17:06    Labs:  CBC:  Recent Labs  07/07/17 0335 07/14/17 0405 07/15/17 0533 07/19/17 0539  WBC 8.3 11.8* 10.1 14.0*  HGB 8.5* 9.3* 9.0* 9.3*  HCT 26.6* 29.2* 27.6* 28.7*  PLT 100* 91* 92* 100*    COAGS:  Recent Labs  07/06/17 0146  07/10/17 1619 07/12/17 0236 07/13/17 0305 07/14/17 0405 07/19/17 0352  INR 1.49  < > 1.56 1.69 1.79 1.62 1.45  APTT 33  --  38*  --   --   --  28  < > = values in this interval not displayed.  BMP:  Recent Labs  07/19/17 0539 07/19/17 1434 07/20/17 0537 07/21/17 0904  NA 143 146* 149* 149*  K 6.7* 6.1* 5.1 4.3  CL 107 110 112* 111  CO2 27 28 26 28   GLUCOSE 123* 101* 110* 149*  BUN 72* 77* 86* 98*  CALCIUM 7.6* 7.4* 7.3* 7.2*  CREATININE 2.41* 2.55* 2.64* 2.79*  GFRNONAA 24* 22* 21* 20*  GFRAA 27* 26* 25* 23*    LIVER FUNCTION TESTS:  Recent Labs  06/22/2017 1605 07/10/17 1619  BILITOT 0.9 0.7  AST 29 80*  ALT 17 25  ALKPHOS 64 68  PROT 9.3* 7.8  ALBUMIN 2.4* 1.6*    Assessment and Plan: 1. Dysphagia, s/p g-tube placement  May use g-tube 24hrs after placement.   Routine flushing  No further IR needs.   Call if needed  Electronically Signed: Veronia Laprise E 07/21/2017, 11:20 AM   I spent a total of 15 Minutes at the the patient's bedside AND on the patient's hospital floor or unit, greater than 50% of which was counseling/coordinating care for dysphagia

## 2017-07-21 NOTE — Progress Notes (Signed)
Palliative Care Progress Note  Reason for consult: Goals of care in light of CVA  I met today with patient's daughter, Brayton Layman, in conjunction with Oretha Ellis from SW.  Daughter is clear that goal moving forward is to initiate tube feeds this evening and work to get her father placed at long term care at [facility].  I had spoken with her last evening on the phone at which point in time she was upset as she felt that it had not been explained to them that insurance will not pay for custodial care if he is unable to participate in rehab (he remains unresponsive), but she reports being appreciative of information received from SW today.  She is working to apply from [employer] on his behalf, and Ms. Aundra Dubin was able to guide her on which facilities may be able to work with them wuith understanding that placement would be private pay at this time with hope he will have long term coverage through medicaid at some point in the future.  It appears at this point that goals are clear with plan for initiation of tube feeds and working toward placement.  Please feel free to call if our team can be of further assistance in the care of [name].  Total time: 25 minutes Greater than 50%  of this time was spent counseling and coordinating care related to the above assessment and plan.   Micheline Rough, MD [place] Team [phone]

## 2017-07-21 NOTE — Progress Notes (Signed)
Nutrition Follow-up  DOCUMENTATION CODES:   Non-severe (moderate) malnutrition in context of chronic illness  INTERVENTION:   -Once PEG tube ready to use for feeding, resume:   Glucerna 1.2 @ 65 ml/hr via PEG   30 ml Prostat BID.    Tube feeding regimen provides 2072 kcal (100% of needs), 123 grams of protein, and 1256 ml of H2O.   NUTRITION DIAGNOSIS:   Malnutrition (moderate) related to chronic illness (CKD, failure to thrive) as evidenced by moderate depletion of body fat, moderate depletions of muscle mass.  Ongoing  GOAL:   Patient will meet greater than or equal to 90% of their needs  Progressing  MONITOR:   TF tolerance, PO intake, Labs, Weight trends, Skin, I & O's  REASON FOR ASSESSMENT:   Consult Enteral/tube feeding initiation and management  ASSESSMENT:   83 y.o. Male with a history of diabetes, hypertension who states that he has had some trouble with his vision for a couple of days. Apparently was found by his family to be confused. He was found disheveled, and soiled. He was brought into the emergency department where a head CT was performed which showed a hypodensity in the left occipital lobe. An MRI was therefore performed which shows a large left PCA territory infarct as well as multifocal other likely embolic infarcts.   Pt resting at time of visit. No family present.   Pt underwent PEG placement on 07/20/17. TF currently on hold; plan to re-start this evening per IR note.   Reviewed CWOCN note from 07/16/17; pt with unstageable pressure injury to sacrum.   Palliative care team continues to follow; pt family continues to desire aggressive care.   Reviewed CSW notes; plan for short term vs long-term rehab.   Labs reviewed: Na: 149, CBGS: 125-134.  Diet Order:  Diet NPO time specified Except for: Sips with Meds  Skin:  Wound (see comment) (UN sacrum, BLEs venous statis wound and possible lymphadema)  Last BM:  07/20/17  Height:   Ht Readings  from Last 1 Encounters:  06/27/2017 6\' 4"  (1.93 m)    Weight:   Wt Readings from Last 1 Encounters:  07/20/17 215 lb 13.3 oz (97.9 kg)    Ideal Body Weight:  86 kg  BMI:  Body mass index is 26.27 kg/m.  Estimated Nutritional Needs:   Kcal:  1950-2150  Protein:  110-125 grams  Fluid:  > 1.9 L  EDUCATION NEEDS:   No education needs identified at this time  Damain Broadus A. Mayford KnifeWilliams, RD, LDN, CDE Pager: [phone] After hours Pager: [phone]

## 2017-07-21 NOTE — Clinical Social Work Note (Signed)
CSW spoke with pt's dtr-Garrett Bradley this AM via phone [phone]) to update re: SNFs willing to discuss placement with Medicaid pending and private pay. CSW spoke with the following facilities:  [facility] beds available. Will accept private pay.   [facility]- No medicaid beds available.   [facility] - Willing to work with Medicaid pending + private pay  [facility]- No medicaid beds available. Will accept for private pay at rate of $288.00/day for semi private room and would need 1 month payment up front.   [facility]- Will have to check with administrator  [place] to work with [employer] pending + private pay  CSW later met with pt's dtr at bedside, alongside Palliative Care MD Garrett Bradley, to reiterate need to apply for LTC Medicaid today and to emphasize that private pay will more that likely have to be paid upon initial admission to a SNF. CSW provided address, phone number and directions to [facility] (county of pt's residence) to apply for Medicaid. Dtr states she will also connect with these facilities to find out private pay rates.   CSW will continue to follow for discharge needs.   Garrett Bradley, [place], [place] Work [phone]

## 2017-07-21 NOTE — Progress Notes (Signed)
Paged MD because daughter asked if we were starting TF tonight. MD Irene LimboGoodrich checked on patient and said he was looking okay and that it was fine to start TF per the nutritionist note. Per nutrition, start at glucerna goal rate 5365ml/hr, and per MD start at 6810ml/hr and follow adult TF protocol to increase rate.

## 2017-07-21 NOTE — Progress Notes (Signed)
PROGRESS NOTE  [name] ZOX:[id number] DOB: 07/18/1934 DOA: 12/15/2016 PCP: [name], [name], MD  Brief Narrative: 83 year old man PMH hypothyroidism, hypertension, diabetes, presented with confusion.Found at home sitting in chair soiled with urine and stool. Evaluation in the emergency department revealed acute CVA, patient seen by neurology. Hospital coursecomplicatedby atrial fibrillation. 8/3 weakness in the left arm was noted and repeats head CT showed acute hemorrhagic infarct in the left occipital lobe and new areas of infarction in the cerebellum bilaterally and right occipital lobe. His fall by neurology and palliative medicine and hospitalization was prolonged as the family wrestled with goals of care. Eventually PEG tube placement was decided upon with transfer to skilled nursing facility thereafter.  Assessment/Plan Acute stroke with associated acute metabolic encephalopathy. 2-D echocardiogram showed LVEF 35-40 percent, carotid Doppler showed no significant ICA stenosis. LDL 62.EEG showed moderate diffuse slowing, no epileptiform activity but neurology recommended Keppra. -no aspirin or anticoagulation secondary to presence of acute hemorrhagic infarct -neurology recommended against TEE as the patient cannot have anticoagulation.  -Prognosis poor, after discussion with palliative medicine, the family elected PEG tube placement and SNF for rehabilitation. He fails to improve, they would then consider hospice.  Acute hypoxic respiratory failure, tachypnea. -Appears to be in new issue since yesterday. No events charted overnight. Hemodynamics stable. Has had daily fever over the last 3 days. Etiology unclear. May be atelectasis.  -check check x-ray to r/o aspiration, wean oxygen as tolerated.  Acute kidney injury superimposed on chronic kidney disease stage III. -pattern and history suggest prerenal. -IVF fluids. Repeat BMP in a.m.  Nutrition. -PEG tube placed 8/14 in the  evening. Can start tube feeds 24 hours thereafter, that is this evening.  Anemia of chronic disease. Stable.  Intermittent thrombocytopenia. Improving. Follow clinically.  Acute systolic congestive heart failure -poor candidate for invasive procedures.  Recommend medical management only. Low-dose beta blocker. No ACE inhibitors secondary to acute kidney injury.  Demand ischemia -No further evaluation recommended. Medical management recommended. -2-D echocardiogram demonstrated LVEF 35-40 percent with moderate diffuse hypokinesis.  Paroxysmal atrial fibrillation. CHA2DS2-VASc 7 -Not candidate for anticoagulation secondary to hemorrhagic stroke -Continue rate control only for now  MRSA UTI -treated with appropriate antibiotics. Course complete.  Essential hypertension. -stable.  Diabetes mellitus type 2. Hemoglobin A1c 8.0. -Continue sliding scale insulin  Hypothyroidism. TSH 6.2. -continue levothyroxine. -Repeat TSH in 4 weeks.  Moderate protein calorie malnutrition. -PEG. Tube feeds as recommended by nutrition.  Sacral pressure injury, unstageable -Management as recommended by wound care   Prognosis remains guarded. Once stable on tube feeds would anticipate transfer skilled nursing facility.  DVT prophylaxis: SCDs Code Status: DNR Family Communication: none Disposition Plan: SNF    Brendia Sacksaniel Goodrich, MD  [facility] Direct contact: [phone] --Via amion app OR  --[email or web address]; password [email or web address]  7PM-7AM contact night coverage as above 07/21/2017, 2:45 PM  LOS: 16 days   Consultants:  Neurology  Palliative medicine  Interventional radiology  Procedures:  8/14 gastrostomy tube insertion.  Echo Study Conclusions  - Left ventricle: The cavity size was normal. There was moderate   concentric hypertrophy. Systolic function was moderately reduced.   The estimated ejection fraction was in the range of 35% to 40%.   Moderate diffuse hypokinesis with  no identifiable regional   variations. - Ventricular septum: Septal motion showed abnormal function,   dyssynergy, and paradox. These changes are consistent with   intraventricular conduction delay. - Aortic valve: There was trivial regurgitation. - Mitral valve: There was mild regurgitation. -  Left atrium: The atrium was mildly dilated. - Right atrium: The atrium was mildly dilated. - Pericardium, extracardiac: A trivial pericardial effusion was   identified. There was a left pleural effusion.  EEG IMPRESSION:  This is an abnormal EEG demonstrating moderate diffuse slowing of electrocerebral activity.  This can be seen in a wide variety of encephalopathic states including those of a toxic, metabolic, or degenerative nature.  The patient did, however, have occasional triphasic sharp waveforms during the EEG which would suggest a metabolic etiology.  There was no epileptiform activity on this recording.  This does not exclude the diagnosis of a seizure disorder.  Correlate clinically.  Antimicrobials:   Interval history/Subjective: Does not respond to voice.  Objective: Vitals: maximum temperature 101.5.temperature 99.6, 30, 103, 139/69, 99% on 4 L.  Exam:     Constitutional: appears calm, comfortable.  Eyes. Pupils and irises appear unremarkable.  ENT. Lips appear unremarkable. Cannot assess hearing. Does not open her mouth.  Respiratory. Clear to auscultation bilaterally. No wheezes, rales or rhonchi. Normal respiratory effort.  Cardiovascular. Regular rate and rhythm. No murmur, rub or gallop. No lower extremity edema.  Abdomen. Soft, nontender, nondistended.  Psychiatric. Cannot assess.  Skin. Dry skin both feet.   I have personally reviewed the following:   Labs:  Blood sugars stable  Sodium 149, chloride 111. Potassium within normal limits. BUN of 90, creatinine up to 2.79.  Urinalysis unremarkable.  Scheduled Meds: . [START ON 07/22/2017] citalopram  20  mg Per Tube Daily  . collagenase   Topical Daily  . [START ON 07/22/2017] feeding supplement (PRO-STAT SUGAR FREE 64)  30 mL Per Tube BID  . insulin aspart  0-9 Units Subcutaneous Q4H  . latanoprost  1 drop Both Eyes QHS  . levETIRAcetam  500 mg Per Tube BID  . [START ON 07/22/2017] levothyroxine  200 mcg Per Tube QAC breakfast  . mouth rinse  15 mL Mouth Rinse BID  . metoprolol tartrate  25 mg Per Tube BID  . [START ON 07/22/2017] senna  1 tablet Per Tube Daily  . silver sulfADIAZINE   Topical Daily   Continuous Infusions: . sodium chloride 50 mL/hr at 07/20/17 0829  . feeding supplement (GLUCERNA 1.2 CAL) Stopped (07/20/17 0000)    Principal Problem:   CVA (cerebral vascular accident) (HCC) Active Problems:   Hypothyroidism   Hypertension   CKD (chronic kidney disease) stage 3, GFR 30-59 ml/min   Chronic indwelling Foley catheter   Diabetes mellitus type 2 with complications (HCC)   Adult failure to thrive   Acute kidney injury (HCC)   New onset atrial fibrillation (HCC)   Elevated troponin   Failure to thrive (0-17)   Elevated INR   Encounter for palliative care   Goals of care, counseling/discussion   Advance care planning   Pressure injury of skin   Acute hypoxemic respiratory failure (HCC)   Thrombocytopenia (HCC)   Demand ischemia (HCC)   LOS: 16 days

## 2017-07-22 LAB — CBC
HEMATOCRIT: 22.8 % — AB (ref 39.0–52.0)
Hemoglobin: 7.1 g/dL — ABNORMAL LOW (ref 13.0–17.0)
MCH: 27.4 pg (ref 26.0–34.0)
MCHC: 31.1 g/dL (ref 30.0–36.0)
MCV: 88 fL (ref 78.0–100.0)
PLATELETS: 116 10*3/uL — AB (ref 150–400)
RBC: 2.59 MIL/uL — AB (ref 4.22–5.81)
RDW: 15 % (ref 11.5–15.5)
WBC: 16.9 10*3/uL — AB (ref 4.0–10.5)

## 2017-07-22 LAB — GLUCOSE, CAPILLARY
GLUCOSE-CAPILLARY: 161 mg/dL — AB (ref 65–99)
GLUCOSE-CAPILLARY: 143 mg/dL — AB (ref 65–99)
GLUCOSE-CAPILLARY: 107 mg/dL — AB (ref 65–99)
Glucose-Capillary: 119 mg/dL — ABNORMAL HIGH (ref 65–99)
Glucose-Capillary: 133 mg/dL — ABNORMAL HIGH (ref 65–99)

## 2017-07-22 LAB — BASIC METABOLIC PANEL
Anion gap: 8 (ref 5–15)
BUN: 101 mg/dL — AB (ref 6–20)
CALCIUM: 7.4 mg/dL — AB (ref 8.9–10.3)
CO2: 29 mmol/L (ref 22–32)
CREATININE: 2.73 mg/dL — AB (ref 0.61–1.24)
Chloride: 113 mmol/L — ABNORMAL HIGH (ref 101–111)
GFR, EST AFRICAN AMERICAN: 24 mL/min — AB (ref >60)
GFR, EST NON AFRICAN AMERICAN: 20 mL/min — AB (ref >60)
Glucose, Bld: 120 mg/dL — ABNORMAL HIGH (ref 65–99)
Potassium: 4.7 mmol/L (ref 3.5–5.1)
SODIUM: 150 mmol/L — AB (ref 135–145)

## 2017-07-22 MED ORDER — METOPROLOL TARTRATE 25 MG/10 ML ORAL SUSPENSION: 25.0000 mg | Freq: Two times a day (BID) | ORAL | 0 refills | Status: AC

## 2017-07-22 MED ORDER — CITALOPRAM HYDROBROMIDE 20 MG PO TABS: 20.0000 mg | ORAL_TABLET | Freq: Every day | ORAL | Status: AC

## 2017-07-22 MED ORDER — SENNA 8.6 MG PO TABS: 1.0000 | ORAL_TABLET | Freq: Every day | ORAL | 0 refills | Status: AC

## 2017-07-22 MED ORDER — COLLAGENASE 250 UNIT/GM EX OINT: TOPICAL_OINTMENT | Freq: Every day | CUTANEOUS | 0 refills | Status: AC

## 2017-07-22 MED ORDER — LEVOTHYROXINE SODIUM 200 MCG PO TABS: 200.0000 ug | ORAL_TABLET | Freq: Every day | ORAL | 1 refills | Status: AC

## 2017-07-22 MED ORDER — DEXTROSE 5 % IV SOLN: INTRAVENOUS | Status: DC

## 2017-07-22 MED ORDER — ORAL CARE MOUTH RINSE: 15.0000 mL | Freq: Two times a day (BID) | OROMUCOSAL | 0 refills | Status: AC

## 2017-07-22 MED ORDER — SILVER SULFADIAZINE 1 % EX CREA: TOPICAL_CREAM | Freq: Every day | CUTANEOUS | 0 refills | Status: AC

## 2017-07-22 MED ORDER — LEVETIRACETAM 100 MG/ML PO SOLN: 500.0000 mg | Freq: Two times a day (BID) | ORAL | 12 refills | Status: AC

## 2017-07-22 MED ORDER — ACETAMINOPHEN 160 MG/5ML PO SOLN: 650.0000 mg | ORAL | 0 refills | Status: AC | PRN

## 2017-07-22 NOTE — Progress Notes (Signed)
Nutrition Follow-up  DOCUMENTATION CODES:   Non-severe (moderate) malnutrition in context of chronic illness  INTERVENTION:   -Continue Glucerna 1.2 @ 50 ml/hr and advance 10 ml/hr every hours to goal rate of 65 ml/hr via PEG   30 ml Prostat BID.    Tube feeding regimen provides 2072 kcal (100% of needs), 123 grams of protein, and 1256 ml of H2O.   NUTRITION DIAGNOSIS:   Malnutrition (moderate) related to chronic illness (CKD, failure to thrive) as evidenced by moderate depletion of body fat, moderate depletions of muscle mass.  Ongoing  GOAL:   Patient will meet greater than or equal to 90% of their needs  Progressing  MONITOR:   TF tolerance, PO intake, Labs, Weight trends, Skin, I & O's  REASON FOR ASSESSMENT:   Consult Enteral/tube feeding initiation and management  ASSESSMENT:   83 y.o. Male with a history of diabetes, hypertension who states that he has had some trouble with his vision for a couple of days. Apparently was found by his family to be confused. He was found disheveled, and soiled. He was brought into the emergency department where a head CT was performed which showed a hypodensity in the left occipital lobe. An MRI was therefore performed which shows a large left PCA territory infarct as well as multifocal other likely embolic infarcts.   Pt underwent PEG placement on 07/20/17.   TF restarted on 07/21/17 PM. Glucerna 1.2 currently infusing via PEG @ 50 ml/hr (goal rate 65 ml/hr). Pt also receive 30 ml Prostat BID. Currently providing 1640 kcals, 102 grams protein, and 966 ml free water daily (84% of estimated kcal needs and 93% of estimated protein needs).   Palliative care team continues to follow; pt family continues to desire aggressive care.   Reviewed CSW notes; plan for short term vs long-term SNF.   Labs reviewed: Na: 150, CBGS: 106-161.   Diet Order:  Diet NPO time specified  Skin:  Wound (see comment) (UN sacrum, BLEs venous statis wound  and possible lymphadema)  Last BM:  07/20/17  Height:   Ht Readings from Last 1 Encounters:  06/28/2017 6\' 4"  (1.93 m)    Weight:   Wt Readings from Last 1 Encounters:  07/20/17 215 lb 13.3 oz (97.9 kg)    Ideal Body Weight:  86 kg  BMI:  Body mass index is 26.27 kg/m.  Estimated Nutritional Needs:   Kcal:  1950-2150  Protein:  110-125 grams  Fluid:  > 1.9 L  EDUCATION NEEDS:   No education needs identified at this time  Khameron Gruenwald A. Mayford KnifeWilliams, RD, LDN, CDE Pager: [phone] After hours Pager: [phone]

## 2017-07-22 NOTE — Progress Notes (Signed)
Patient ID: Garrett Bradley, male   DOB: 09-09-1934, 83 y.o.   MRN: [id number]    PROGRESS NOTE   Garrett Bradley  WUJ:[id number] DOB: [birth year] DOA: 07/14/2017  PCP: Assunta Found, MD   Brief Narrative:  83 year old man PMH hypothyroidism, hypertension, diabetes, presented with confusion. Found at home sitting in chair soiled with urine and stool. Evaluation in the emergency department revealed acute CVA, patient seen by neurology. Hospital course complicated by atrial fibrillation. 8/3 weakness in the left arm was noted and repeats head CT showed acute hemorrhagic infarct in the left occipital lobe and new areas of infarction in the cerebellum bilaterally and right occipital lobe. Patient followed by neurology and palliative medicine and hospitalization was prolonged as the family wrestled with goals of care. Eventually PEG tube placement was decided upon with transfer to skilled nursing facility thereafter.  Assessment/Plan Acute stroke with associated acute metabolic encephalopathy - with underlying hypernatremia likely contributing as well to encephalopathy - ECHO with EF 35-40%, EEG with mod-diffuse slowing - neurology team advised against TEE as pt not anticoagulation candidate - keep on Keppra per neurology recommendations - prognosis remains guarded - Na is trending up despite IVF - keep on tube feeding as per family wishes   Acute hypoxic respiratory failure, tachypnea. - suspect this to be related to acute systolic CHF, acute on chronic diastolic CHF - pt has been on IVF and he is still congested on exam, CXR confirms pulmonary vascular congestion but PNA is not entirely excluded  - this is difficult situation as pt is NPO but has been started on tube feeding - Na is up, I will have to discuss with family need to stop IVF to avoid further respiratory deterioration - I am also concerned that WBC is trending up so again, PNA is in differential and ? Need for ABX coverage in  this case likely HCAP coverage will be needed - will monitor respiratory status closely, if family still desires, can ask for CT chest for clearer evaluation  - stop IVF now   Acute kidney injury superimposed on chronic kidney disease stage III. - IVF have been provided but pt more congested on exam  - Cr has however remained unchanged  - this is rather difficult situation and can not be resolved - if we provide more fluids respiratory status will deteriorate, if we place on lasix, will likely see worsening renal function - prognosis remains guarded and must be addressed stat with family - I have spoken with daughter but she was driving and will call me back to address these medical issues - I have made her aware we are stopping IVF   Anemia of chronic disease - drop in Hg overnight - no transfusion at this time as it will likely compromise resp status - will need repeat Hg and if further drop in Hg, will discuss further GOC with family   Intermittent thrombocytopenia - slightly better this AM  Acute systolic congestive heart failure, acute on chronic diastolic CHF  - poor prognosis - stop IVF as pt more volume overloaded - weight up from 200 lbs to 215 lbs and pt not eating - LE edema and crackles on exam, CXR confirms congestion - hold lasix due to renal injury - discuss with family further GOC ASAP  Demand ischemia - No further evaluation recommended. Medical management recommended. - no cardiac interventions would be helpful  Paroxysmal atrial fibrillation. CHA2DS2-VASc 7 - no AC candidate due to hemorrhagic strokes - rate control  only   MRSA UTI - completed ABX course   Essential hypertension. 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 - on SSI for now  Hypothyroidism. TSH 6.2. - continue synthroid   Moderate protein calorie malnutrition. - PEG tube feeding   Sacral  pressure injury, unstageable - per WOC  DVT prophylaxis: SCD Code Status: DNR Family Communication: Daughter over the phone, she will call me back on my cell phone when she is not driving   Disposition Plan: to be determined, pt is not stable for SNF discharge at this time, further GOC must be readdressed before discharge, pt is not responsive and would not be able to participate in any therapy at SNF. Prognosis is guarded as pt is not responding to current medical measures. Unfortunately we can not provide much more in terms of medical care.   Consultants:   Neurology  IR  PCT  Procedures:   8/14 gastrostomy tube insertion.  ECHO with EF 35-40%  EEG - abnormal EEG demonstrating moderate diffuse slowing of electrocerebral activity. This can be seen in a wide variety of encephalopathic states including those of a toxic, metabolic, or degenerative nature.   Subjective: Remains unresponsive   Objective: Vitals:   07/21/17 0805 07/21/17 1732 07/21/17 1957 07/22/17 0428  BP:  118/62 102/62 121/66  Pulse: 95 80 (!) 55 61  Resp:  (!) 24 19 19   Temp:  97.8 F (36.6 C) 97.9 F (36.6 C) 98 F (36.7 C)  TempSrc:  Oral Oral Oral  SpO2:  100% 100% 100%  Weight:      Height:        Intake/Output Summary (Last 24 hours) at 07/22/17 0906 Last data filed at 07/22/17 0600  Gross per 24 hour  Intake             1450 ml  Output             1300 ml  Net              150 ml   Filed Weights   07/18/17 0442 07/19/17 0422 07/20/17 0412  Weight: 98.2 kg (216 lb 7.9 oz) 98.1 kg (216 lb 5.6 oz) 97.9 kg (215 lb 13.3 oz)    Examination:  General exam: non responsive  Respiratory system: Crackles at bases with diminished air movement Cardiovascular system: IRRR. No rubs, gallops or clicks. +1-2 bilateral LE edema Gastrointestinal system: Abdomen is nondistended, soft and nontender.  Central nervous system: unresponsive    Data Reviewed: I have personally reviewed following labs and  imaging studies  CBC:  Recent Labs Lab 07/19/17 0539 07/22/17 0423  WBC 14.0* 16.9*  HGB 9.3* 7.1*  HCT 28.7* 22.8*  MCV 88.0 88.0  PLT 100* 116*   Basic Metabolic Panel:  Recent Labs Lab 07/19/17 0539 07/19/17 1434 07/20/17 0537 07/21/17 0904 07/22/17 0423  NA 143 146* 149* 149* 150*  K 6.7* 6.1* 5.1 4.3 4.7  CL 107 110 112* 111 113*  CO2 27 28 26 28 29   GLUCOSE 123* 101* 110* 149* 120*  BUN 72* 77* 86* 98* 101*  CREATININE 2.41* 2.55* 2.64* 2.79* 2.73*  CALCIUM 7.6* 7.4* 7.3* 7.2* 7.4*   Coagulation Profile:  Recent Labs Lab 07/19/17 0352  INR 1.45   CBG:  Recent Labs Lab 07/21/17 1620 07/21/17 1955 07/22/17 0002 07/22/17 0418 07/22/17 0744  GLUCAP 81 91 106* 119* 133*   Urine analysis:  Component Value Date/Time   COLORURINE YELLOW 07/20/2017 0057   APPEARANCEUR CLEAR 07/20/2017 0057   LABSPEC 1.014 07/20/2017 0057   PHURINE 6.0 07/20/2017 0057   GLUCOSEU NEGATIVE 07/20/2017 0057   HGBUR MODERATE (A) 07/20/2017 0057   BILIRUBINUR NEGATIVE 07/20/2017 0057   KETONESUR NEGATIVE 07/20/2017 0057   PROTEINUR 30 (A) 07/20/2017 0057   UROBILINOGEN 0.2 07/28/2014 0736   NITRITE NEGATIVE 07/20/2017 0057   LEUKOCYTESUR SMALL (A) 07/20/2017 0057    Radiology Studies: Ir Gastrostomy Tube Mod Sed  Result Date: 07/20/2017 INDICATION: Dysphagia. In need of gastrostomy tube placement for enteric nutrition supplementation. EXAM: PULL TROUGH GASTROSTOMY TUBE PLACEMENT COMPARISON:  CT abdomen pelvis - 03/11/2016 MEDICATIONS: The patient is currently admitted to the hospital receiving intravenous antibiotics; Antibiotics were administered within 1 hour of the procedure. Glucagon 1 mg IV CONTRAST:  30 mL of Isovue 300 administered into the gastric lumen. ANESTHESIA/SEDATION: Moderate (conscious) sedation was employed during this procedure. A total of Fentanyl 50 mcg was administered intravenously. Moderate Sedation Time: 15 minutes. The patient's level of  consciousness and vital signs were monitored continuously by radiology nursing throughout the procedure under my direct supervision. FLUOROSCOPY TIME:  6 minutes 12 seconds (45 mGy) COMPLICATIONS: None immediate. PROCEDURE: Informed written consent was obtained from the patient's family following explanation of the procedure, risks, benefits and alternatives. A time out was performed prior to the initiation of the procedure. Ultrasound scanning was performed to demarcate the edge of the left lobe of the liver. Maximal barrier sterile technique utilized including caps, mask, sterile gowns, sterile gloves, large sterile drape, hand hygiene and Betadine prep. The left upper quadrant was sterilely prepped and draped. An oral gastric catheter was inserted into the stomach under fluoroscopy. The existing nasogastric feeding tube was removed. The left costal margin and air opacified transverse colon were identified and avoided. Air was injected into the stomach for insufflation and visualization under fluoroscopy. Under sterile conditions a 17 gauge trocar needle was utilized to access the stomach percutaneously beneath the left subcostal margin after the overlying soft tissues were anesthetized with 1% Lidocaine with epinephrine. Needle position was confirmed within the stomach with aspiration of air and injection of small amount of contrast. A single T tack was deployed for gastropexy. Over an Amplatz guide wire, a 9-French sheath was inserted into the stomach. A snare device was utilized to capture the oral gastric catheter. The snare device was pulled retrograde from the stomach up the esophagus and out the oropharynx. The 20-French pull-through gastrostomy was connected to the snare device and pulled antegrade through the oropharynx down the esophagus into the stomach and then through the percutaneous tract external to the patient. The gastrostomy was assembled externally. Contrast injection confirms position in the  stomach. Several spot radiographic images were obtained in various obliquities for documentation. The patient tolerated procedure well without immediate post procedural complication. FINDINGS: After successful fluoroscopic guided placement, the gastrostomy tube is appropriately positioned with internal disc against the ventral aspect of the gastric lumen. IMPRESSION: Successful fluoroscopic insertion of a 20-French pull-through gastrostomy tube. The gastrostomy may be used immediately for medication administration and in 24 hrs for the initiation of feeds. Electronically Signed   By: Simonne ComeJohn  Watts M.D.   On: 07/20/2017 17:06   Dg Chest Port 1 View  Result Date: 07/21/2017 CLINICAL DATA:  Hypoxia EXAM: PORTABLE CHEST 1 VIEW COMPARISON:  10-13-17 FINDINGS: Tiny bilateral effusions. Consolidation at the left lung base. Mild cardiomegaly with central congestion. Mild diffuse perihilar ill-defined  opacity. Aortic atherosclerosis. No pneumothorax. IMPRESSION: 1. Cardiomegaly with central vascular congestion and a ill-defined hazy perihilar opacity which may reflect mild edema 2. Small bilateral effusions. Dense left lower lobe consolidation, atelectasis versus pneumonia Electronically Signed   By: Jasmine Pang M.D.   On: 07/21/2017 23:09    Scheduled Meds: . citalopram  20 mg Per Tube Daily  . collagenase   Topical Daily  . feeding supplement (PRO-STAT SUGAR FREE 64)  30 mL Per Tube BID  . insulin aspart  0-9 Units Subcutaneous Q4H  . latanoprost  1 drop Both Eyes QHS  . levETIRAcetam  500 mg Per Tube BID  . levothyroxine  200 mcg Per Tube QAC breakfast  . mouth rinse  15 mL Mouth Rinse BID  . metoprolol tartrate  25 mg Per Tube BID  . senna  1 tablet Per Tube Daily  . silver sulfADIAZINE   Topical Daily   Continuous Infusions: . sodium chloride 100 mL/hr at 07/22/17 0532  . feeding supplement (GLUCERNA 1.2 CAL) Stopped (07/20/17 0000)  . feeding supplement (GLUCERNA 1.2 CAL) 1,000 mL (07/21/17  1739)     LOS: 17 days   Time spent: 50  minutes   Debbora Presto, MD [facility] Pager [phone]  If 7PM-7AM, please contact night-coverage [email or web address] Password [email or web address] 07/22/2017, 9:06 AM

## 2017-07-23 LAB — CBC
HCT: 25.3 % — ABNORMAL LOW (ref 39.0–52.0)
HEMOGLOBIN: 7.9 g/dL — AB (ref 13.0–17.0)
MCH: 28 pg (ref 26.0–34.0)
MCHC: 31.2 g/dL (ref 30.0–36.0)
MCV: 89.7 fL (ref 78.0–100.0)
PLATELETS: 104 10*3/uL — AB (ref 150–400)
RBC: 2.82 MIL/uL — ABNORMAL LOW (ref 4.22–5.81)
RDW: 15.4 % (ref 11.5–15.5)
WBC: 16.4 10*3/uL — ABNORMAL HIGH (ref 4.0–10.5)

## 2017-07-23 LAB — BASIC METABOLIC PANEL
Anion gap: 9 (ref 5–15)
BUN: 107 mg/dL — AB (ref 6–20)
CHLORIDE: 113 mmol/L — AB (ref 101–111)
CO2: 26 mmol/L (ref 22–32)
CREATININE: 2.52 mg/dL — AB (ref 0.61–1.24)
Calcium: 7.5 mg/dL — ABNORMAL LOW (ref 8.9–10.3)
GFR calc non Af Amer: 22 mL/min — ABNORMAL LOW (ref >60)
GFR, EST AFRICAN AMERICAN: 26 mL/min — AB (ref >60)
Glucose, Bld: 185 mg/dL — ABNORMAL HIGH (ref 65–99)
Potassium: 4.6 mmol/L (ref 3.5–5.1)
Sodium: 148 mmol/L — ABNORMAL HIGH (ref 135–145)

## 2017-07-23 LAB — GLUCOSE, CAPILLARY
GLUCOSE-CAPILLARY: 148 mg/dL — AB (ref 65–99)
GLUCOSE-CAPILLARY: 152 mg/dL — AB (ref 65–99)
GLUCOSE-CAPILLARY: 155 mg/dL — AB (ref 65–99)
GLUCOSE-CAPILLARY: 179 mg/dL — AB (ref 65–99)
Glucose-Capillary: 153 mg/dL — ABNORMAL HIGH (ref 65–99)
Glucose-Capillary: 166 mg/dL — ABNORMAL HIGH (ref 65–99)

## 2017-07-23 NOTE — Progress Notes (Signed)
Patient ID: Garrett Bradley, male   DOB: 22-Sep-1934, 83 y.o.   MRN: [id number]    PROGRESS NOTE  KINTE TRIM  WUJ:[id number] DOB: 16-Nov-1934 DOA: 07/04/2017  PCP: Assunta Found, MD   Brief Narrative:  83 year old man PMH hypothyroidism, hypertension, diabetes, presented with confusion. Found at home sitting in chair soiled with urine and stool. Evaluation in the emergency department revealed acute CVA, patient seen by neurology. Hospital course complicated by atrial fibrillation. 8/3 weakness in the left arm was noted and repeats head CT showed acute hemorrhagic infarct in the left occipital lobe and new areas of infarction in the cerebellum bilaterally and right occipital lobe. Patient followed by neurology and palliative medicine and hospitalization was prolonged as the family wrestled with goals of care. Eventually PEG tube placement was decided upon with transfer to skilled nursing facility thereafter.  Assessment/Plan Acute stroke with associated acute metabolic encephalopathy - with underlying hypernatremia likely contributing as well to encephalopathy - ECHO with EF 35-40%, EEG with mod-diffuse slowing - neurology team advised against TEE as pt not anticoagulation candidate - continue keppra - prognosis remains guarded but pt is somewhat better this AM, more vocal but still not able to talk, not following any commands  - IVF have been stopped due to volume overload and Na is trending down and is 148 this AM - keep on tube feeding as per family wishes   Acute hypoxic respiratory failure, tachypnea. - suspect this to be related to acute systolic CHF, acute on chronic diastolic CHF - less congested on exam this AM but still with crackles  - no IVF, holding on lasix as well until renal function stabilizes   Acute kidney injury superimposed on chronic kidney disease stage III. - IVF have been provided but pt more congested on exam  - Cr has however remained unchanged  - we have  stopped IVF and Cr is now trending down - no further IVF to be given at this time - BMP In AM  Anemia of chronic disease - Hg better this AM, no evidence of active bleeding - CBC in AM  Intermittent thrombocytopenia - monitor while inpatient   Acute systolic congestive heart failure, acute on chronic diastolic CHF  - poor prognosis - IVF stopped 8/16 due to pulmonary vascular congestion  - weight was up from 200 lbs to 215 lbs and pt not eating - this is the weight trend in the past 72 hours  Filed Weights   07/19/17 0422 07/20/17 0412 07/23/17 0342  Weight: 98.1 kg (216 lb 5.6 oz) 97.9 kg (215 lb 13.3 oz) 93 kg (205 lb 0.4 oz)  - no more IVF and holding lasix for now as well  Demand ischemia - No further evaluation recommended - no cardiac interventions would be helpful  Paroxysmal atrial fibrillation. CHA2DS2-VASc 7 - no AC candidate due to hemorrhagic strokes - control rate only   MRSA UTI - completed ABX course   Essential hypertension. 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  - cont SSI  Hypothyroidism. TSH 6.2. - continue synthroid   Moderate protein calorie malnutrition. - keep PEG tube feeding   Sacral pressure injury, unstageable - per WOC  DVT prophylaxis: SCD Code Status: DNR Family Communication: daughter over the phone  Disposition Plan: blood work slightly better, pt more vocal, continue discussions on GOC and consideration to hospice care if no clinical improvement in 24- 48 hours  Consultants:   Neurology  IR  PCT  Procedures:   8/14 gastrostomy tube insertion.  ECHO with EF 35-40%  EEG - abnormal EEG demonstrating moderate diffuse slowing of electrocerebral activity. This can be seen in a wide variety of encephalopathic states including those of a toxic, metabolic, or degenerative nature.   Subjective: More vocal but no clear speech.   Objective: Vitals:     07/22/17 1426 07/22/17 2011 07/23/17 0342 07/23/17 0430  BP: 115/62 (!) 122/56  120/60  Pulse: 77 83  76  Resp: 19 20  19   Temp: 98.7 F (37.1 C) 98.8 F (37.1 C)  98.8 F (37.1 C)  TempSrc: Axillary Axillary  Axillary  SpO2: 100% 100%  100%  Weight:   93 kg (205 lb 0.4 oz)   Height:        Intake/Output Summary (Last 24 hours) at 07/23/17 0935 Last data filed at 07/23/17 0802  Gross per 24 hour  Intake            820.5 ml  Output             2175 ml  Net          -1354.5 ml   Filed Weights   07/19/17 0422 07/20/17 0412 07/23/17 0342  Weight: 98.1 kg (216 lb 5.6 oz) 97.9 kg (215 lb 13.3 oz) 93 kg (205 lb 0.4 oz)    Examination:  Physical Exam  Constitutional: Appears more vocal but still not verbal and not following commands, NAD CVS: RRR, S1/S2 +, no gallops, no carotid bruit.  Pulmonary: Crackles at bases  Abdominal: Soft. BS +,  no distension, tenderness Musculoskeletal: +1 bilateral LE edema, bilateral upper extremity edema   Data Reviewed: I have personally reviewed following labs and imaging studies  CBC:  Recent Labs Lab 07/19/17 0539 07/22/17 0423 07/23/17 0425  WBC 14.0* 16.9* 16.4*  HGB 9.3* 7.1* 7.9*  HCT 28.7* 22.8* 25.3*  MCV 88.0 88.0 89.7  PLT 100* 116* 104*   Basic Metabolic Panel:  Recent Labs Lab 07/19/17 1434 07/20/17 0537 07/21/17 0904 07/22/17 0423 07/23/17 0425  NA 146* 149* 149* 150* 148*  K 6.1* 5.1 4.3 4.7 4.6  CL 110 112* 111 113* 113*  CO2 28 26 28 29 26   GLUCOSE 101* 110* 149* 120* 185*  BUN 77* 86* 98* 101* 107*  CREATININE 2.55* 2.64* 2.79* 2.73* 2.52*  CALCIUM 7.4* 7.3* 7.2* 7.4* 7.5*   Coagulation Profile:  Recent Labs Lab 07/19/17 0352  INR 1.45   CBG:  Recent Labs Lab 07/22/17 1630 07/22/17 2009 07/23/17 0045 07/23/17 0417 07/23/17 0800  GLUCAP 143* 107* 148* 153* 152*   Urine analysis:    Component Value Date/Time   COLORURINE YELLOW 07/20/2017 0057   APPEARANCEUR CLEAR 07/20/2017 0057    LABSPEC 1.014 07/20/2017 0057   PHURINE 6.0 07/20/2017 0057   GLUCOSEU NEGATIVE 07/20/2017 0057   HGBUR MODERATE (A) 07/20/2017 0057   BILIRUBINUR NEGATIVE 07/20/2017 0057   KETONESUR NEGATIVE 07/20/2017 0057   PROTEINUR 30 (A) 07/20/2017 0057   UROBILINOGEN 0.2 07/28/2014 0736   NITRITE NEGATIVE 07/20/2017 0057   LEUKOCYTESUR SMALL (A) 07/20/2017 0057    Radiology Studies: Dg Chest Port 1 View  Result Date: 07/21/2017 CLINICAL DATA:  Hypoxia EXAM: PORTABLE CHEST 1 VIEW COMPARISON:  06/29/2017 FINDINGS: Tiny bilateral effusions. Consolidation at the left lung base. Mild cardiomegaly with central congestion. Mild diffuse perihilar ill-defined opacity. Aortic atherosclerosis. No pneumothorax. IMPRESSION: 1. Cardiomegaly with central vascular congestion and a ill-defined  hazy perihilar opacity which may reflect mild edema 2. Small bilateral effusions. Dense left lower lobe consolidation, atelectasis versus pneumonia Electronically Signed   By: Jasmine Pang M.D.   On: 07/21/2017 23:09    Scheduled Meds: . citalopram  20 mg Per Tube Daily  . collagenase   Topical Daily  . feeding supplement (PRO-STAT SUGAR FREE 64)  30 mL Per Tube BID  . insulin aspart  0-9 Units Subcutaneous Q4H  . latanoprost  1 drop Both Eyes QHS  . levETIRAcetam  500 mg Per Tube BID  . levothyroxine  200 mcg Per Tube QAC breakfast  . mouth rinse  15 mL Mouth Rinse BID  . metoprolol tartrate  25 mg Per Tube BID  . senna  1 tablet Per Tube Daily  . silver sulfADIAZINE   Topical Daily   Continuous Infusions: . feeding supplement (GLUCERNA 1.2 CAL) Stopped (07/20/17 0000)     LOS: 18 days   Time spent: 35  minutes   Debbora Presto, MD [facility] Pager [phone]  If 7PM-7AM, please contact night-coverage [email or web address] Password [email or web address] 07/23/2017, 9:35 AM

## 2017-07-24 ENCOUNTER — Inpatient Hospital Stay (HOSPITAL_COMMUNITY): Payer: Medicare Other

## 2017-07-24 LAB — URINALYSIS, ROUTINE W REFLEX MICROSCOPIC
BILIRUBIN URINE: NEGATIVE
Glucose, UA: 50 mg/dL — AB
Ketones, ur: NEGATIVE mg/dL
NITRITE: NEGATIVE
Protein, ur: NEGATIVE mg/dL
SPECIFIC GRAVITY, URINE: 1.014 (ref 1.005–1.030)
pH: 5 (ref 5.0–8.0)

## 2017-07-24 LAB — CBC
HEMATOCRIT: 23.1 % — AB (ref 39.0–52.0)
Hemoglobin: 7.3 g/dL — ABNORMAL LOW (ref 13.0–17.0)
MCH: 27.5 pg (ref 26.0–34.0)
MCHC: 31.6 g/dL (ref 30.0–36.0)
MCV: 87.2 fL (ref 78.0–100.0)
Platelets: 86 10*3/uL — ABNORMAL LOW (ref 150–400)
RBC: 2.65 MIL/uL — ABNORMAL LOW (ref 4.22–5.81)
RDW: 15.3 % (ref 11.5–15.5)
WBC: 20.1 10*3/uL — ABNORMAL HIGH (ref 4.0–10.5)

## 2017-07-24 LAB — GLUCOSE, CAPILLARY
GLUCOSE-CAPILLARY: 120 mg/dL — AB (ref 65–99)
GLUCOSE-CAPILLARY: 158 mg/dL — AB (ref 65–99)
Glucose-Capillary: 148 mg/dL — ABNORMAL HIGH (ref 65–99)
Glucose-Capillary: 152 mg/dL — ABNORMAL HIGH (ref 65–99)
Glucose-Capillary: 152 mg/dL — ABNORMAL HIGH (ref 65–99)
Glucose-Capillary: 202 mg/dL — ABNORMAL HIGH (ref 65–99)
Glucose-Capillary: 208 mg/dL — ABNORMAL HIGH (ref 65–99)

## 2017-07-24 LAB — BASIC METABOLIC PANEL
Anion gap: 10 (ref 5–15)
BUN: 114 mg/dL — AB (ref 6–20)
CALCIUM: 7.5 mg/dL — AB (ref 8.9–10.3)
CO2: 28 mmol/L (ref 22–32)
CREATININE: 2.5 mg/dL — AB (ref 0.61–1.24)
Chloride: 116 mmol/L — ABNORMAL HIGH (ref 101–111)
GFR calc Af Amer: 26 mL/min — ABNORMAL LOW (ref >60)
GFR calc non Af Amer: 23 mL/min — ABNORMAL LOW (ref >60)
GLUCOSE: 170 mg/dL — AB (ref 65–99)
Potassium: 4.5 mmol/L (ref 3.5–5.1)
Sodium: 154 mmol/L — ABNORMAL HIGH (ref 135–145)

## 2017-07-24 MED ORDER — DEXTROSE 5 % IV SOLN
INTRAVENOUS | Status: AC
  Administered 2017-07-24: 13:00:00 via INTRAVENOUS

## 2017-07-24 NOTE — Progress Notes (Addendum)
Patient ID: Garrett Bradley, male   DOB: 1934-09-21, 83 y.o.   MRN: [id number]    PROGRESS NOTE  Garrett Bradley  WUJ:[id number] DOB: 22-Jul-1934 DOA: 06/27/2017  PCP: Assunta Found, MD   Brief Narrative:  83 year old man PMH hypothyroidism, hypertension, diabetes, presented with confusion. Found at home sitting in chair soiled with urine and stool. Evaluation in the emergency department revealed acute CVA, patient seen by neurology. Hospital course complicated by atrial fibrillation. 8/3 weakness in the left arm was noted and repeats head CT showed acute hemorrhagic infarct in the left occipital lobe and new areas of infarction in the cerebellum bilaterally and right occipital lobe. Patient followed by neurology and palliative medicine and hospitalization was prolonged as the family wrestled with goals of care. Eventually PEG tube placement was decided upon with transfer to skilled nursing facility thereafter.  Assessment/Plan Acute stroke with associated acute metabolic encephalopathy - with underlying hypernatremia likely contributing as well to encephalopathy - ECHO with EF 35-40%, EEG with mod-diffuse slowing - neurology team advised against TEE as pt not anticoagulation candidate - continue keppra - prognosis guarded  - IVF have been stopped due to volume overload and Na was trending down - Na however now up again, we can try low rate IVF to see if this will help but we are now in difficult situation as we cannot provide any additional care without causing subsequent problems  - keep on tube feeding as per family wishes  - GOC discussions today   Acute hypoxic respiratory failure, tachypnea. - suspect this to be related to acute systolic CHF, acute on chronic diastolic CHF - still with crackles on exam despite holding IVF - CT chest requested   Acute kidney injury superimposed on chronic kidney disease stage III. - IVF have been initially provided but pt got more congested on  exam  - Cr has however remained unchanged while pt on IVF - we have stopped IVF and Cr is now trending down but now up again  - will attempt low rate IVF but this will again likely cause volume overload - will discuss with family as pt is not responding to current medical measures  Anemia of chronic disease - Hg still low but no evidence of active bleeding   Intermittent thrombocytopenia - drop in Plt more, likely related to ongoing medical issues  Acute systolic congestive heart failure, acute on chronic diastolic CHF  - poor prognosis - IVF stopped 8/16 due to pulmonary vascular congestion  - weight was up from 200 lbs to 215 lbs and pt not eating - weight trend Filed Weights   07/20/17 0412 07/23/17 0342 07/24/17 0411  Weight: 97.9 kg (215 lb 13.3 oz) 93 kg (205 lb 0.4 oz) 93 kg (205 lb 0.4 oz)  - we can try low rate IVF and see if any benefit, pt unfortunately not responding to current measures  Demand ischemia - No further evaluation recommended - no cardiac interventions would be helpful  Paroxysmal atrial fibrillation. CHA2DS2-VASc 7 - no AC candidate due to hemorrhagic strokes - rate controlled   MRSA UTI, leukocytosis  - completed ABX course  - WBC up again - CT chest pain  - hold off on ABX for now until we discuss with family   Essential hypertension. 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  - continue SSI   Hypothyroidism. TSH 6.2. - keep on synthroid   Moderate  protein calorie malnutrition. - cont PEG feeding   Sacral pressure injury, unstageable - per WOC  DVT prophylaxis: SCD Code Status: DNR Family Communication: no family at bedside this AM  Disposition Plan: to be determined   Consultants:   Neurology  IR  PCT  Procedures:   8/14 gastrostomy tube insertion.  ECHO with EF 35-40%  EEG - abnormal EEG demonstrating moderate diffuse slowing of  electrocerebral activity. This can be seen in a wide variety of encephalopathic states including those of a toxic, metabolic, or degenerative nature.   Subjective: Remains non verbal   Objective: Vitals:   07/23/17 0430 07/23/17 1406 07/23/17 2032 07/24/17 0411  BP: 120/60 111/60 137/69 (!) 132/58  Pulse: 76 79 96 88  Resp: 19 20 20 20   Temp: 98.8 F (37.1 C) 99.5 F (37.5 C) 99.5 F (37.5 C) 98.9 F (37.2 C)  TempSrc: Axillary Axillary Axillary Axillary  SpO2: 100% 99% 98% 99%  Weight:    93 kg (205 lb 0.4 oz)  Height:        Intake/Output Summary (Last 24 hours) at 07/24/17 0725 Last data filed at 07/24/17 0413  Gross per 24 hour  Intake              650 ml  Output             1875 ml  Net            -1225 ml   Filed Weights   07/20/17 0412 07/23/17 0342 07/24/17 0411  Weight: 97.9 kg (215 lb 13.3 oz) 93 kg (205 lb 0.4 oz) 93 kg (205 lb 0.4 oz)   Examination:  Physical Exam  Constitutional: Appears non verbal, non responsive  CVS: RRR, S1/S2 +, no gallops, no carotid bruit.  Pulmonary: Poor inspiratory effort, crackles at bases  Abdominal: Soft. BS +,  no distension, tenderness, rebound or guarding.  Musculoskeletal: +1 upper and lower extremity edema   Data Reviewed: I have personally reviewed following labs and imaging studies  CBC:  Recent Labs Lab 07/19/17 0539 07/22/17 0423 07/23/17 0425  WBC 14.0* 16.9* 16.4*  HGB 9.3* 7.1* 7.9*  HCT 28.7* 22.8* 25.3*  MCV 88.0 88.0 89.7  PLT 100* 116* 104*   Basic Metabolic Panel:  Recent Labs Lab 07/19/17 1434 07/20/17 0537 07/21/17 0904 07/22/17 0423 07/23/17 0425  NA 146* 149* 149* 150* 148*  K 6.1* 5.1 4.3 4.7 4.6  CL 110 112* 111 113* 113*  CO2 28 26 28 29 26   GLUCOSE 101* 110* 149* 120* 185*  BUN 77* 86* 98* 101* 107*  CREATININE 2.55* 2.64* 2.79* 2.73* 2.52*  CALCIUM 7.4* 7.3* 7.2* 7.4* 7.5*   Coagulation Profile:  Recent Labs Lab 07/19/17 0352  INR 1.45   CBG:  Recent Labs Lab  07/23/17 1758 07/23/17 2020 07/24/17 0005 07/24/17 0409 07/24/17 0520  GLUCAP 166* 155* 152* 152* 120*   Urine analysis:    Component Value Date/Time   COLORURINE YELLOW 07/20/2017 0057   APPEARANCEUR CLEAR 07/20/2017 0057   LABSPEC 1.014 07/20/2017 0057   PHURINE 6.0 07/20/2017 0057   GLUCOSEU NEGATIVE 07/20/2017 0057   HGBUR MODERATE (A) 07/20/2017 0057   BILIRUBINUR NEGATIVE 07/20/2017 0057   KETONESUR NEGATIVE 07/20/2017 0057   PROTEINUR 30 (A) 07/20/2017 0057   UROBILINOGEN 0.2 07/28/2014 0736   NITRITE NEGATIVE 07/20/2017 0057   LEUKOCYTESUR SMALL (A) 07/20/2017 0057    Radiology Studies: No results found.  Scheduled Meds: . citalopram  20 mg Per Tube Daily  .  collagenase   Topical Daily  . feeding supplement (PRO-STAT SUGAR FREE 64)  30 mL Per Tube BID  . insulin aspart  0-9 Units Subcutaneous Q4H  . latanoprost  1 drop Both Eyes QHS  . levETIRAcetam  500 mg Per Tube BID  . levothyroxine  200 mcg Per Tube QAC breakfast  . mouth rinse  15 mL Mouth Rinse BID  . metoprolol tartrate  25 mg Per Tube BID  . senna  1 tablet Per Tube Daily  . silver sulfADIAZINE   Topical Daily   Continuous Infusions: . feeding supplement (GLUCERNA 1.2 CAL) 1,000 mL (07/24/17 0522)     LOS: 19 days   Time spent: 35  minutes   Debbora Presto, MD [facility] Pager [phone]  If 7PM-7AM, please contact night-coverage [email or web address] Password [email or web address] 07/24/2017, 7:25 AM

## 2017-07-24 NOTE — Clinical Social Work Note (Signed)
Per MD, disposition to be determined. Family is considering SNF, with the understanding that it would likely be an out of pocket expense unless LTC Medicaid is approved. MD unsure at this point if SNF is an appropriate discharge plan and to revisit goals of care with family. SNF vs. Hospice residential. CSW continuing to follow for discharge needs.   Corlis Hove, LCSWA, LCASA  Clinical Social Work [phone]

## 2017-07-24 NOTE — Progress Notes (Signed)
MD made aware of the increased temp despite prn med given.

## 2017-07-25 LAB — GLUCOSE, CAPILLARY
GLUCOSE-CAPILLARY: 215 mg/dL — AB (ref 65–99)
Glucose-Capillary: 220 mg/dL — ABNORMAL HIGH (ref 65–99)
Glucose-Capillary: 190 mg/dL — ABNORMAL HIGH (ref 65–99)
Glucose-Capillary: 216 mg/dL — ABNORMAL HIGH (ref 65–99)
Glucose-Capillary: 186 mg/dL — ABNORMAL HIGH (ref 65–99)
Glucose-Capillary: 169 mg/dL — ABNORMAL HIGH (ref 65–99)

## 2017-07-25 LAB — CBC
HCT: 21.4 % — ABNORMAL LOW (ref 39.0–52.0)
HEMOGLOBIN: 6.7 g/dL — AB (ref 13.0–17.0)
MCH: 27.6 pg (ref 26.0–34.0)
MCHC: 31.3 g/dL (ref 30.0–36.0)
MCV: 88.1 fL (ref 78.0–100.0)
Platelets: 97 10*3/uL — ABNORMAL LOW (ref 150–400)
RBC: 2.43 MIL/uL — ABNORMAL LOW (ref 4.22–5.81)
RDW: 15.6 % — AB (ref 11.5–15.5)
WBC: 18.2 10*3/uL — ABNORMAL HIGH (ref 4.0–10.5)

## 2017-07-25 LAB — BASIC METABOLIC PANEL
Anion gap: 7 (ref 5–15)
BUN: 122 mg/dL — AB (ref 6–20)
CALCIUM: 7.6 mg/dL — AB (ref 8.9–10.3)
CO2: 28 mmol/L (ref 22–32)
CREATININE: 2.53 mg/dL — AB (ref 0.61–1.24)
Chloride: 117 mmol/L — ABNORMAL HIGH (ref 101–111)
GFR, EST AFRICAN AMERICAN: 26 mL/min — AB (ref >60)
GFR, EST NON AFRICAN AMERICAN: 22 mL/min — AB (ref >60)
Glucose, Bld: 206 mg/dL — ABNORMAL HIGH (ref 65–99)
Potassium: 4.5 mmol/L (ref 3.5–5.1)
SODIUM: 152 mmol/L — AB (ref 135–145)

## 2017-07-25 LAB — PREPARE RBC (CROSSMATCH)

## 2017-07-25 MED ORDER — SODIUM CHLORIDE 0.9 % IV SOLN
Freq: Once | INTRAVENOUS | Status: AC
  Administered 2017-07-25: 15:00:00 via INTRAVENOUS

## 2017-07-25 NOTE — Progress Notes (Signed)
CRITICAL VALUE ALERT  Critical Value:  Hgb=6.7  Date & Time Notied:  07/25/17, 5009  Provider Notified: Dr. Antionette Char  Orders Received/Actions taken: waiting for response

## 2017-07-25 NOTE — Progress Notes (Signed)
Patient ID: Garrett Bradley, male   DOB: February 07, 1934, 83 y.o.   MRN: [id number]    PROGRESS NOTE  RONDA KAZMI  GNF:[id number] DOB: Mar 09, 1934 DOA: 26-Jul-2017  PCP: Assunta Found, MD   Brief Narrative:  83 year old man PMH hypothyroidism, hypertension, diabetes, presented with confusion. Found at home sitting in chair soiled with urine and stool. Evaluation in the emergency department revealed acute CVA, patient seen by neurology. Hospital course complicated by atrial fibrillation. 8/3 weakness in the left arm was noted and repeats head CT showed acute hemorrhagic infarct in the left occipital lobe and new areas of infarction in the cerebellum bilaterally and right occipital lobe. Patient followed by neurology and palliative medicine and hospitalization was prolonged as the family wrestled with goals of care. Eventually PEG tube placement was decided upon with transfer to skilled nursing facility thereafter.  Assessment/Plan Acute stroke with associated acute metabolic encephalopathy - with underlying hypernatremia likely contributing as well to encephalopathy - ECHO with EF 35-40%, EEG with mod-diffuse slowing - neurology team advised against TEE as pt not anticoagulation candidate - continue keppra - prognosis guarded  - I have discussed GOC with daughter over the phone as family is not here in the hospital  - I have explained that fluids are currently running at 50 cc/hr but pt again is getting more congested on exam  - very difficult medical situation as currently pt is clinically deteriorating and we can not offer any more medical interventions - daughter understands to critical nature of her father's illness and will talk with rest of the family about transition to comfort care   Acute hypoxic respiratory failure, tachypnea. - suspect this to be related to acute systolic CHF, acute on chronic diastolic CHF - more crackles on exam, diminished air movement - CT chest with bilateral  pleural effusions, this was discussed with family as noted above - no further escalation of medical care per daughter's request, she wants to speak with family first and make final decision on GOC prior to any additional changes in medical management  Sepsis secondary to ? multilobar PNA - pt with fevers and leukocytosis 8/18 - CT chest concerning for possible PNA - no ABX per family until GOC addressed   ? Pancreatic tail mass - with possible metastatic involvement of liver, ? Adrenal gland - this was unexpected but rather incidental finding - also discussed with family and daughter was clear that no further interventions would be beneficial to her father and he would not want anything done about this  - family to talk among themselves and let me know about GOC, allowing father comfort  - my understanding is, this was pt's wish per his daughter   Acute kidney injury superimposed on chronic kidney disease stage III. - IVF have been initially provided but pt got more congested on exam  - Cr has however remained unchanged   Anemia of chronic disease - Hg still low, possibly related to ? Malignancy but not very clear - one U of PRBC given and daughter was ok with that but no further escalation of care desired  Intermittent thrombocytopenia - drop in Plt more, likely related to ongoing medical issues  Acute systolic congestive heart failure, acute on chronic diastolic CHF  - poor prognosis - weight was up from 200 lbs to 215 lbs and pt not eating - weight trend Filed Weights   07/23/17 0342 07/24/17 0411 07/25/17 0408  Weight: 93 kg (205 lb 0.4 oz) 93 kg (205 lb  0.4 oz) 93 kg (205 lb 0.4 oz)  - will continue IVF low rate, possibly stop if family agrees to allow more comfort to pt  Demand ischemia - No further evaluation recommended - no cardiac interventions would be helpful  Paroxysmal atrial fibrillation. CHA2DS2-VASc 7 - no AC candidate due to hemorrhagic strokes - rate  controlled   MRSA UTI, leukocytosis  - completed ABX course for UTI - WBC up again and as noted above likely from developing PNA - no further escalation of ABX per family until GOC discussion   Essential hypertension. 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  - continue SSI   Hypothyroidism. TSH 6.2. - keep on synthroid   Moderate protein calorie malnutrition. - cont PEG feeding   Sacral pressure injury, unstageable - per WOC  DVT prophylaxis: SCD Code Status: DNR Family Communication: daughter updated over the phone Disposition Plan: to be determined   Consultants:   Neurology  IR  PCT  Procedures:   8/14 gastrostomy tube insertion.  ECHO with EF 35-40%  EEG - abnormal EEG demonstrating moderate diffuse slowing of electrocerebral activity. This can be seen in a wide variety of encephalopathic states including those of a toxic, metabolic, or degenerative nature.   Subjective: Remains non verbal   Objective: Vitals:   07/24/17 1300 07/24/17 1600 07/24/17 2005 07/25/17 0408  BP:   134/70 (!) 103/55  Pulse:   95 90  Resp:   20 20  Temp: (!) 101 F (38.3 C) (!) 101 F (38.3 C) 99.1 F (37.3 C) 99.9 F (37.7 C)  TempSrc:   Axillary Axillary  SpO2:   99% 100%  Weight:    93 kg (205 lb 0.4 oz)  Height:        Intake/Output Summary (Last 24 hours) at 07/25/17 1234 Last data filed at 07/25/17 0500  Gross per 24 hour  Intake           539.17 ml  Output             1101 ml  Net          -561.83 ml   Filed Weights   07/23/17 0342 07/24/17 0411 07/25/17 0408  Weight: 93 kg (205 lb 0.4 oz) 93 kg (205 lb 0.4 oz) 93 kg (205 lb 0.4 oz)   Examination: Physical Exam  Constitutional: Appears lethargic, non responsive  CVS: RRR, S1/S2 +, no gallops, no carotid bruit.  Pulmonary: Crackles at bases  Abdominal: Soft. BS +,  no distension, tenderness, rebound or guarding.    Data Reviewed: I have personally reviewed following labs and imaging studies  CBC:  Recent Labs Lab 07/19/17 0539 07/22/17 0423 07/23/17 0425 07/24/17 0636 07/25/17 0523  WBC 14.0* 16.9* 16.4* 20.1* 18.2*  HGB 9.3* 7.1* 7.9* 7.3* 6.7*  HCT 28.7* 22.8* 25.3* 23.1* 21.4*  MCV 88.0 88.0 89.7 87.2 88.1  PLT 100* 116* 104* 86* 97*   Basic Metabolic Panel:  Recent Labs Lab 07/21/17 0904 07/22/17 0423 07/23/17 0425 07/24/17 0636 07/25/17 0523  NA 149* 150* 148* 154* 152*  K 4.3 4.7 4.6 4.5 4.5  CL 111 113* 113* 116* 117*  CO2 28 29 26 28 28   GLUCOSE 149* 120* 185* 170* 206*  BUN 98* 101* 107* 114* 122*  CREATININE 2.79* 2.73* 2.52* 2.50* 2.53*  CALCIUM 7.2* 7.4* 7.5* 7.5* 7.6*   Coagulation Profile:  Recent Labs Lab 07/19/17 0352  INR  1.45   CBG:  Recent Labs Lab 07/24/17 2001 07/25/17 0007 07/25/17 0407 07/25/17 0752 07/25/17 1206  GLUCAP 208* 215* 220* 190* 216*   Urine analysis:    Component Value Date/Time   COLORURINE YELLOW 07/24/2017 2300   APPEARANCEUR CLOUDY (A) 07/24/2017 2300   LABSPEC 1.014 07/24/2017 2300   PHURINE 5.0 07/24/2017 2300   GLUCOSEU 50 (A) 07/24/2017 2300   HGBUR MODERATE (A) 07/24/2017 2300   BILIRUBINUR NEGATIVE 07/24/2017 2300   KETONESUR NEGATIVE 07/24/2017 2300   PROTEINUR NEGATIVE 07/24/2017 2300   UROBILINOGEN 0.2 07/28/2014 0736   NITRITE NEGATIVE 07/24/2017 2300   LEUKOCYTESUR LARGE (A) 07/24/2017 2300    Radiology Studies: Ct Chest Wo Contrast  Result Date: 07/24/2017 CLINICAL DATA:  Hypoxia with fever EXAM: CT CHEST WITHOUT CONTRAST TECHNIQUE: Multidetector CT imaging of the chest was performed following the standard protocol without IV contrast. COMPARISON:  Radiograph 07/21/2017, CT abdomen pelvis 03/11/2016 FINDINGS: Cardiovascular: Limited evaluation without intravenous contrast. Aortic atherosclerosis. Ectasia of the ascending segment without aneurysmal dilatation. Coronary artery calcification. Mild  cardiomegaly. Small amount of pericardial effusion. Mediastinum/Nodes: Midline trachea. No thyroid mass. Mild mediastinal lymph nodes, measuring up to 12 mm in the right paratracheal space. Esophagus grossly unremarkable. Lungs/Pleura: Small to moderate right pleural effusion and moderate left pleural effusion. Scattered ground-glass densities in the right greater than left upper lobes. Partial consolidation within the bilateral lower lobes. Negative for pneumothorax. Upper Abdomen: Partially obscured by artifact. Suspicion of multiple low-density masses within the liver, measuring up to 3.7 cm. Multiple calcified stones in the gallbladder. Ill-defined soft tissue density in the region of the left adrenal gland measuring 3.9 cm. Diffusely enlarged appearing right adrenal gland. Ill-defined soft tissue density left upper quadrant in the region of pancreatic tail. Probable peripancreatic and gastrohepatic lymph nodes. Partially visualized gastrostomy. Musculoskeletal: Degenerative changes of the spine. No acute or suspicious bone lesion. IMPRESSION: 1. Bilateral pleural effusions, small to moderate on the right and moderate on the left. Partial consolidations within the bilateral lower lobes may reflect atelectasis or pneumonia. Additional scattered ground-glass foci within the bilateral upper lobes may reflect edema or additional foci of infection. Cardiomegaly. 2. Mild mediastinal lymph nodes measuring up to 12 mm, possibly reactive 3. Suspicion of multiple low-density masses within the liver, raising possibility of metastatic disease. Additional finding of suspected left adrenal mass and diffuse enlargement of right adrenal gland. Ill-defined soft tissue density in the left upper quadrant in the region of pancreatic tail, unable to exclude a mass in this region. Contrast-enhanced CT or MRI follow-up recommended when clinically feasible to evaluate the above findings. 4. Gallstones Aortic Atherosclerosis  (ICD10-I70.0). Electronically Signed   By: Jasmine Pang M.D.   On: 07/24/2017 18:27   Scheduled Meds: . citalopram  20 mg Per Tube Daily  . collagenase   Topical Daily  . feeding supplement (PRO-STAT SUGAR FREE 64)  30 mL Per Tube BID  . insulin aspart  0-9 Units Subcutaneous Q4H  . latanoprost  1 drop Both Eyes QHS  . levETIRAcetam  500 mg Per Tube BID  . levothyroxine  200 mcg Per Tube QAC breakfast  . mouth rinse  15 mL Mouth Rinse BID  . metoprolol tartrate  25 mg Per Tube BID  . senna  1 tablet Per Tube Daily  . silver sulfADIAZINE   Topical Daily   Continuous Infusions: . sodium chloride    . feeding supplement (GLUCERNA 1.2 CAL) 1,000 mL (07/25/17 1207)     LOS: 20 days  Time spent: 35  minutes   Debbora Presto, MD [facility] Pager [phone]  If 7PM-7AM, please contact night-coverage [email or web address] Password [facility] 07/25/2017, 12:34 PM

## 2017-07-26 LAB — TYPE AND SCREEN
ABO/RH(D): O POS
Antibody Screen: NEGATIVE
Unit division: 0

## 2017-07-26 LAB — BPAM RBC
Blood Product Expiration Date: 201809202359
ISSUE DATE / TIME: 201808191433
Unit Type and Rh: 5100

## 2017-07-26 LAB — GLUCOSE, CAPILLARY
GLUCOSE-CAPILLARY: 101 mg/dL — AB (ref 65–99)
GLUCOSE-CAPILLARY: 107 mg/dL — AB (ref 65–99)

## 2017-08-07 NOTE — Progress Notes (Signed)
Body transported to morgue.

## 2017-08-07 NOTE — Discharge Summary (Signed)
Death Summary  Garrett Bradley:[id number] DOB: 07-20-1934 DOA: 07-28-17  PCP: Assunta Found, MD  Admit date: 2017/07/28 Date of Death: 08-18-17 Notification: Assunta Found, MD notified of death of 08-18-17  Brief Narrative:  83 year old man PMH hypothyroidism, hypertension, diabetes, presented with confusion. Found at home sitting in chair soiled with urine and stool. Evaluation in the emergency department revealed acute CVA, patient seen by neurology. Hospital course complicated by atrial fibrillation. 8/3 weakness in the left arm was noted and repeats head CT showed acute hemorrhagic infarct in the left occipital lobe and new areas of infarction in the cerebellum bilaterally and right occipital lobe. Patient followed by neurology and palliative medicine and hospitalization was prolonged as the family wrestled with goals of care.  Assessment/Plan Acute stroke with associated acute metabolic encephalopathy - with underlying hypernatremia likely contributing as well to encephalopathy - ECHO with EF 35-40%, EEG with mod-diffuse slowing - neurology team advised against TEE as pt not anticoagulation candidate - continued keppra - pt deteriorated despite medical measures, family was aware of guarded prognosis   Acute hypoxic respiratory failure, tachypnea. - suspect this to be related to acute systolic CHF, acute on chronic diastolic CHF - CT chest with bilateral pleural effusions, this was discussed with family as noted above - family was ok with allowing comfort for patient   Sepsis secondary to ? multilobar PNA - pt with fevers and leukocytosis 8/18 - CT chest concerning for possible PNA - no ABX per family in an effort to allow comfort   ? Pancreatic tail mass - with possible metastatic involvement of liver, ? Adrenal gland - this was unexpected but rather incidental finding - also discussed with family and daughter was clear that no further interventions would be  beneficial to her father and he would not want anything done about this  - comfort allowed   Acute kidney injury superimposed on chronic kidney disease stage III. - IVF have been initially provided but pt got more congested on exam  - Cr has however remained unchanged   Anemia of chronic disease - Hg still low, possibly related to ? Malignancy but not very clear - one U of PRBC given and daughter was ok with that but no further escalation of care desired  Intermittent thrombocytopenia - drop in Plt more, likely related to ongoing medical issues - no further escalation of care to allow comfort   Acute systolic congestive heart failure, acute on chronic diastolic CHF  - poor prognosis - weight was up from 200 lbs to 215 lbs and pt not eating - weight trend      Filed Weights   07/23/17 0342 07/24/17 0411 07/25/17 0408  Weight: 93 kg (205 lb 0.4 oz) 93 kg (205 lb 0.4 oz) 93 kg (205 lb 0.4 oz)   Demand ischemia - No further evaluation recommended - no cardiac interventions recommended   Paroxysmal atrial fibrillation. CHA2DS2-VASc7 - no AC candidate due to hemorrhagic strokes  MRSA UTI, leukocytosis  - completed ABX course for UTI - WBC up again and as noted above likely from developing PNA - no further escalation of ABX per family   Essential hypertension. 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   Hypothyroidism. TSH 6.2. - was on synthroid   Moderate protein calorie malnutrition.  Sacral pressure injury, unstageable  DVT prophylaxis: SCD Code Status: DNR  Consultants:   Neurology  IR  PCT  Procedures:   8/14 gastrostomy tube insertion.  ECHO with EF 35-40%  EEG - abnormal EEG demonstrating moderate diffuse slowing of electrocerebral activity. This can be seen in a wide variety of encephalopathic states including those of a toxic, metabolic, or  degenerative nature.     The results of significant diagnostics from this hospitalization (including imaging, microbiology, ancillary and laboratory) are listed below for reference.    Significant Diagnostic Studies: Dg Chest 1 View  Result Date: 07/06/2017 CLINICAL DATA:  Mental status change, incontinence. EXAM: CHEST 1 VIEW COMPARISON:  Chest x-ray of March 16, 2016 FINDINGS: The lungs are adequately inflated. The interstitial markings are mildly increased. There is no alveolar infiltrate or pleural effusion. The heart is top-normal in size. The central pulmonary vascularity is mildly prominent. There are degenerative changes of both shoulders. IMPRESSION: The findings suggest low-grade CHF.  There is no alveolar pneumonia. Electronically Signed   By: David  [place] M.D.   On: 07/02/2017 15:41   Ct Head Wo Contrast  Result Date: 07/09/2017 CLINICAL DATA:  Stroke followup EXAM: CT HEAD WITHOUT CONTRAST TECHNIQUE: Contiguous axial images were obtained from the base of the skull through the vertex without intravenous contrast. COMPARISON:  CT and MRI 07/01/2017 FINDINGS: Brain: Acute infarct left posterior cerebral artery territory involving the left occipital lobe and posterior thalamus. There is mild amount of hemorrhage within the infarct, similar to the prior CT and MRI. New areas of hypodensity in the cerebellum bilaterally compatible with acute infarct. No hemorrhage in the posterior fossa. New area of hypodensity right occipital lobe compatible with acute infarct without hemorrhage. Generalized atrophy. Negative for hydrocephalus. Chronic white matter disease. Chronic left frontal infarct. No shift of the midline structures. Vascular: Negative for hyperdense vessel Skull: Negative Sinuses/Orbits: Negative Other: None IMPRESSION: Acute hemorrhagic infarct in the left occipital lobe similar to prior CT and MRI New areas of acute infarct in the cerebellum bilaterally and in the right occipital lobe.  Findings consistent with continued posterior circulation emboli These results will be called to the ordering clinician or representative by the Radiologist Assistant, and communication documented in the PACS or zVision Dashboard. Electronically Signed   By: Marlan Palau M.D.   On: 07/09/2017 16:50   Ct Head Wo Contrast  Result Date: 06/29/2017 CLINICAL DATA:  Confusion with incontinence EXAM: CT HEAD WITHOUT CONTRAST TECHNIQUE: Contiguous axial images were obtained from the base of the skull through the vertex without intravenous contrast. COMPARISON:  July 26, 2014 FINDINGS: Brain: There is mild diffuse atrophy. There is a recent appearing infarct in the medial left occipital lobe with decreased attenuation. There is sulcal effacement in the posterior left parietal lobe, potentially a second focus of early infarct. There is no edema in this area appreciable. There is no mass, hemorrhage, or extra-axial fluid collection. No midline shift. Elsewhere, there is slight small vessel disease in the centra semiovale bilaterally. Vascular: There is subtle increased attenuation in the proximal left middle cerebral artery compared to prior study. This appearance may represent early hyperdense vessel and may be indicative of early acute infarct in the left middle cerebral artery distribution. There is calcification in each carotid siphon region. Skull: Bony calvarium appears intact. Sinuses/Orbits: Visualized paranasal sinuses are clear. Visualized orbits appear symmetric bilaterally. Other: Mastoid air cells are clear. IMPRESSION: Recent appearing infarct in the left medial occipital lobe. Suspect early acute infarct in the left parietal lobe with questionable early hyperdense vessel change in the left middle cerebral artery. Given these  findings, correlation with MR pre and post-contrast advised to further evaluate. No mass or hemorrhage evident. No midline shift. Foci of calcification noted in each carotid siphon.  Electronically Signed   By: Bretta Bang III M.D.   On: 2017/07/24 16:04   Ct Chest Wo Contrast  Result Date: 07/24/2017 CLINICAL DATA:  Hypoxia with fever EXAM: CT CHEST WITHOUT CONTRAST TECHNIQUE: Multidetector CT imaging of the chest was performed following the standard protocol without IV contrast. COMPARISON:  Radiograph 07/21/2017, CT abdomen pelvis 03/11/2016 FINDINGS: Cardiovascular: Limited evaluation without intravenous contrast. Aortic atherosclerosis. Ectasia of the ascending segment without aneurysmal dilatation. Coronary artery calcification. Mild cardiomegaly. Small amount of pericardial effusion. Mediastinum/Nodes: Midline trachea. No thyroid mass. Mild mediastinal lymph nodes, measuring up to 12 mm in the right paratracheal space. Esophagus grossly unremarkable. Lungs/Pleura: Small to moderate right pleural effusion and moderate left pleural effusion. Scattered ground-glass densities in the right greater than left upper lobes. Partial consolidation within the bilateral lower lobes. Negative for pneumothorax. Upper Abdomen: Partially obscured by artifact. Suspicion of multiple low-density masses within the liver, measuring up to 3.7 cm. Multiple calcified stones in the gallbladder. Ill-defined soft tissue density in the region of the left adrenal gland measuring 3.9 cm. Diffusely enlarged appearing right adrenal gland. Ill-defined soft tissue density left upper quadrant in the region of pancreatic tail. Probable peripancreatic and gastrohepatic lymph nodes. Partially visualized gastrostomy. Musculoskeletal: Degenerative changes of the spine. No acute or suspicious bone lesion. IMPRESSION: 1. Bilateral pleural effusions, small to moderate on the right and moderate on the left. Partial consolidations within the bilateral lower lobes may reflect atelectasis or pneumonia. Additional scattered ground-glass foci within the bilateral upper lobes may reflect edema or additional foci of infection.  Cardiomegaly. 2. Mild mediastinal lymph nodes measuring up to 12 mm, possibly reactive 3. Suspicion of multiple low-density masses within the liver, raising possibility of metastatic disease. Additional finding of suspected left adrenal mass and diffuse enlargement of right adrenal gland. Ill-defined soft tissue density in the left upper quadrant in the region of pancreatic tail, unable to exclude a mass in this region. Contrast-enhanced CT or MRI follow-up recommended when clinically feasible to evaluate the above findings. 4. Gallstones Aortic Atherosclerosis (ICD10-I70.0). Electronically Signed   By: Jasmine Pang M.D.   On: 07/24/2017 18:27   Mr Brain Wo Contrast (neuro Protocol)  Result Date: 24-Jul-2017 CLINICAL DATA:  Altered mental status and weakness EXAM: MRI HEAD WITHOUT CONTRAST MRA HEAD WITHOUT CONTRAST TECHNIQUE: Multiplanar, multiecho pulse sequences of the brain and surrounding structures were obtained without intravenous contrast. Angiographic images of the head were obtained using MRA technique without contrast. COMPARISON:  None. FINDINGS: MRI HEAD FINDINGS Brain: The midline structures are normal. There is a large area of diffusion restriction within the left posterior cerebral artery distribution. There are scattered other small foci of diffusion restriction within both cerebellar hemispheres, the left thalamus and anterior temporal lobe and multiple sites in the left parietal lobe and both frontal lobes. There is multifocal hyperintense T2-weighted signal within the periventricular white matter, most often seen in the setting of chronic microvascular ischemia. No mass lesion. There is petechial hemorrhage throughout the left PCA distribution. No hydrocephalus, age advanced atrophy or lobar predominant volume loss. No dural abnormality or extra-axial collection. Skull and upper cervical spine: The visualized skull base, calvarium, upper cervical spine and extracranial soft tissues are normal.  Sinuses/Orbits: No fluid levels or advanced mucosal thickening. No mastoid effusion. Normal orbits. MRA HEAD FINDINGS Intracranial internal carotid arteries:  Normal. Anterior cerebral arteries: Normal. Middle cerebral arteries: Normal. Posterior communicating arteries: Present on the right. Posterior cerebral arteries: The left PCA is occluded in its mid P2 segment. The right PCA is normal. Basilar artery: Normal. Vertebral arteries: Codominant Normal. Superior cerebellar arteries: Normal. Anterior inferior cerebellar arteries: Normal. Posterior inferior cerebellar arteries: Normal. IMPRESSION: 1. Left PCA territory acute infarct with associated diffuse petechial hemorrhage throughout the infarct site, without mass effect. 2. Occlusion of left posterior cerebral artery at the mid P2 segment. 3. Scattered subcentimeter foci of acute ischemia within both cerebellar hemispheres, left thalamus, left anterior temporal lobe, left parietal lobe and both frontal lobes. The distribution suggests a central cardioembolic process. Electronically Signed   By: Deatra Robinson M.D.   On: 07-21-2017 17:46   Ir Gastrostomy Tube Mod Sed  Result Date: 07/20/2017 INDICATION: Dysphagia. In need of gastrostomy tube placement for enteric nutrition supplementation. EXAM: PULL TROUGH GASTROSTOMY TUBE PLACEMENT COMPARISON:  CT abdomen pelvis - 03/11/2016 MEDICATIONS: The patient is currently admitted to the hospital receiving intravenous antibiotics; Antibiotics were administered within 1 hour of the procedure. Glucagon 1 mg IV CONTRAST:  30 mL of Isovue 300 administered into the gastric lumen. ANESTHESIA/SEDATION: Moderate (conscious) sedation was employed during this procedure. A total of Fentanyl 50 mcg was administered intravenously. Moderate Sedation Time: 15 minutes. The patient's level of consciousness and vital signs were monitored continuously by radiology nursing throughout the procedure under my direct supervision. FLUOROSCOPY  TIME:  6 minutes 12 seconds (45 mGy) COMPLICATIONS: None immediate. PROCEDURE: Informed written consent was obtained from the patient's family following explanation of the procedure, risks, benefits and alternatives. A time out was performed prior to the initiation of the procedure. Ultrasound scanning was performed to demarcate the edge of the left lobe of the liver. Maximal barrier sterile technique utilized including caps, mask, sterile gowns, sterile gloves, large sterile drape, hand hygiene and Betadine prep. The left upper quadrant was sterilely prepped and draped. An oral gastric catheter was inserted into the stomach under fluoroscopy. The existing nasogastric feeding tube was removed. The left costal margin and air opacified transverse colon were identified and avoided. Air was injected into the stomach for insufflation and visualization under fluoroscopy. Under sterile conditions a 17 gauge trocar needle was utilized to access the stomach percutaneously beneath the left subcostal margin after the overlying soft tissues were anesthetized with 1% Lidocaine with epinephrine. Needle position was confirmed within the stomach with aspiration of air and injection of small amount of contrast. A single T tack was deployed for gastropexy. Over an Amplatz guide wire, a 9-French sheath was inserted into the stomach. A snare device was utilized to capture the oral gastric catheter. The snare device was pulled retrograde from the stomach up the esophagus and out the oropharynx. The 20-French pull-through gastrostomy was connected to the snare device and pulled antegrade through the oropharynx down the esophagus into the stomach and then through the percutaneous tract external to the patient. The gastrostomy was assembled externally. Contrast injection confirms position in the stomach. Several spot radiographic images were obtained in various obliquities for documentation. The patient tolerated procedure well without  immediate post procedural complication. FINDINGS: After successful fluoroscopic guided placement, the gastrostomy tube is appropriately positioned with internal disc against the ventral aspect of the gastric lumen. IMPRESSION: Successful fluoroscopic insertion of a 20-French pull-through gastrostomy tube. The gastrostomy may be used immediately for medication administration and in 24 hrs for the initiation of feeds. Electronically Signed   By: Jonny Ruiz  Watts M.D.   On: 07/20/2017 17:06   Dg Chest Port 1 View  Result Date: 07/21/2017 CLINICAL DATA:  Hypoxia EXAM: PORTABLE CHEST 1 VIEW COMPARISON:  08/06/2017 FINDINGS: Tiny bilateral effusions. Consolidation at the left lung base. Mild cardiomegaly with central congestion. Mild diffuse perihilar ill-defined opacity. Aortic atherosclerosis. No pneumothorax. IMPRESSION: 1. Cardiomegaly with central vascular congestion and a ill-defined hazy perihilar opacity which may reflect mild edema 2. Small bilateral effusions. Dense left lower lobe consolidation, atelectasis versus pneumonia Electronically Signed   By: Jasmine Pang M.D.   On: 07/21/2017 23:09   Mr Maxine Glenn Head (cerebral Arteries)  Result Date: 07/23/2017 CLINICAL DATA:  Altered mental status and weakness EXAM: MRI HEAD WITHOUT CONTRAST MRA HEAD WITHOUT CONTRAST TECHNIQUE: Multiplanar, multiecho pulse sequences of the brain and surrounding structures were obtained without intravenous contrast. Angiographic images of the head were obtained using MRA technique without contrast. COMPARISON:  None. FINDINGS: MRI HEAD FINDINGS Brain: The midline structures are normal. There is a large area of diffusion restriction within the left posterior cerebral artery distribution. There are scattered other small foci of diffusion restriction within both cerebellar hemispheres, the left thalamus and anterior temporal lobe and multiple sites in the left parietal lobe and both frontal lobes. There is multifocal hyperintense  T2-weighted signal within the periventricular white matter, most often seen in the setting of chronic microvascular ischemia. No mass lesion. There is petechial hemorrhage throughout the left PCA distribution. No hydrocephalus, age advanced atrophy or lobar predominant volume loss. No dural abnormality or extra-axial collection. Skull and upper cervical spine: The visualized skull base, calvarium, upper cervical spine and extracranial soft tissues are normal. Sinuses/Orbits: No fluid levels or advanced mucosal thickening. No mastoid effusion. Normal orbits. MRA HEAD FINDINGS Intracranial internal carotid arteries: Normal. Anterior cerebral arteries: Normal. Middle cerebral arteries: Normal. Posterior communicating arteries: Present on the right. Posterior cerebral arteries: The left PCA is occluded in its mid P2 segment. The right PCA is normal. Basilar artery: Normal. Vertebral arteries: Codominant Normal. Superior cerebellar arteries: Normal. Anterior inferior cerebellar arteries: Normal. Posterior inferior cerebellar arteries: Normal. IMPRESSION: 1. Left PCA territory acute infarct with associated diffuse petechial hemorrhage throughout the infarct site, without mass effect. 2. Occlusion of left posterior cerebral artery at the mid P2 segment. 3. Scattered subcentimeter foci of acute ischemia within both cerebellar hemispheres, left thalamus, left anterior temporal lobe, left parietal lobe and both frontal lobes. The distribution suggests a central cardioembolic process. Electronically Signed   By: Deatra Robinson M.D.   On: 07/10/2017 17:46    Microbiology: No results found for this or any previous visit (from the past 240 hour(s)).   Labs: Basic Metabolic Panel:  Recent Labs Lab 07/21/17 0904 07/22/17 0423 07/23/17 0425 07/24/17 0636 07/25/17 0523  NA 149* 150* 148* 154* 152*  K 4.3 4.7 4.6 4.5 4.5  CL 111 113* 113* 116* 117*  CO2 28 29 26 28 28   GLUCOSE 149* 120* 185* 170* 206*  BUN 98* 101*  107* 114* 122*  CREATININE 2.79* 2.73* 2.52* 2.50* 2.53*  CALCIUM 7.2* 7.4* 7.5* 7.5* 7.6*   CBC:  Recent Labs Lab 07/22/17 0423 07/23/17 0425 07/24/17 0636 07/25/17 0523  WBC 16.9* 16.4* 20.1* 18.2*  HGB 7.1* 7.9* 7.3* 6.7*  HCT 22.8* 25.3* 23.1* 21.4*  MCV 88.0 89.7 87.2 88.1  PLT 116* 104* 86* 97*   CBG:  Recent Labs Lab 07/25/17 1206 07/25/17 1622 07/25/17 1957 07/13/2017 0010 07/10/2017 0356  GLUCAP 216* 186* 169* 107* 101*  Urinalysis    Component Value Date/Time   COLORURINE YELLOW 07/24/2017 2300   APPEARANCEUR CLOUDY (A) 07/24/2017 2300   LABSPEC 1.014 07/24/2017 2300   PHURINE 5.0 07/24/2017 2300   GLUCOSEU 50 (A) 07/24/2017 2300   HGBUR MODERATE (A) 07/24/2017 2300   BILIRUBINUR NEGATIVE 07/24/2017 2300   KETONESUR NEGATIVE 07/24/2017 2300   PROTEINUR NEGATIVE 07/24/2017 2300   UROBILINOGEN 0.2 07/28/2014 0736   NITRITE NEGATIVE 07/24/2017 2300   LEUKOCYTESUR LARGE (A) 07/24/2017 2300    SIGNED:  Debbora Presto, MD  [facility] 11-Aug-2017, 7:20 AM Pager [phone]  If 7PM-7AM, please contact night-coverage [email or web address] Password [email or web address]

## 2017-08-07 NOTE — Progress Notes (Signed)
I observed pt unresponsive no breathing, pulseless with lab tech trying to get the blood around 0625, infomed the charge nurse Leonie Man and verified his death, I called the [place] donor referral no. [phone] I spoke to [facility], I left message to his son Tallin Ehly tel no [phone], also I paged Dr. Merdis Delay about his death, waiting for death certificate and funeral name.

## 2017-08-07 NOTE — Progress Notes (Signed)
Son in for visit. Funeral home given. Emotional support given

## 2017-08-07 NOTE — Progress Notes (Signed)
I tried to call his son Ladene Artist 3x and Maxine Glenn for the update, I left message waiting for their return call, endorsed to day shift.

## 2017-08-07 NOTE — Progress Notes (Signed)
I spoke with Garrett Bradley his daughter and she's on her way here, death certificate in pt room.

## 2017-08-07 DEATH — deceased
